# Patient Record
Sex: Female | Born: 1955
Health system: Southern US, Community
[De-identification: ages and names within clinical notes are randomized; demographics above are authoritative.]

## PROBLEM LIST (undated history)

## (undated) DIAGNOSIS — K5900 Constipation, unspecified: Secondary | ICD-10-CM

## (undated) DIAGNOSIS — N390 Urinary tract infection, site not specified: Secondary | ICD-10-CM

## (undated) DIAGNOSIS — E785 Hyperlipidemia, unspecified: Secondary | ICD-10-CM

## (undated) DIAGNOSIS — E119 Type 2 diabetes mellitus without complications: Secondary | ICD-10-CM

## (undated) DIAGNOSIS — L03115 Cellulitis of right lower limb: Secondary | ICD-10-CM

## (undated) DIAGNOSIS — I10 Essential (primary) hypertension: Secondary | ICD-10-CM

## (undated) DIAGNOSIS — M1990 Unspecified osteoarthritis, unspecified site: Secondary | ICD-10-CM

## (undated) DIAGNOSIS — R519 Headache, unspecified: Secondary | ICD-10-CM

## (undated) DIAGNOSIS — J439 Emphysema, unspecified: Secondary | ICD-10-CM

## (undated) DIAGNOSIS — Z720 Tobacco use: Secondary | ICD-10-CM

## (undated) DIAGNOSIS — R51 Headache: Secondary | ICD-10-CM

## (undated) HISTORY — DX: Essential (primary) hypertension: I10

## (undated) HISTORY — PX: TIBIA FRACTURE SURGERY: SHX806

## (undated) HISTORY — DX: Urinary tract infection, site not specified: N39.0

## (undated) HISTORY — PX: ANKLE FRACTURE SURGERY: SHX122

## (undated) HISTORY — PX: FRACTURE SURGERY: SHX138

## (undated) HISTORY — DX: Hyperlipidemia, unspecified: E78.5

## (undated) HISTORY — PX: CATARACT EXTRACTION: SUR2

---

## 2013-10-05 DIAGNOSIS — L03115 Cellulitis of right lower limb: Secondary | ICD-10-CM

## 2013-10-05 HISTORY — DX: Cellulitis of right lower limb: L03.115

## 2014-10-20 ENCOUNTER — Emergency Department (HOSPITAL_COMMUNITY): Payer: 59

## 2014-10-20 ENCOUNTER — Encounter (HOSPITAL_COMMUNITY): Payer: Self-pay | Admitting: Emergency Medicine

## 2014-10-20 ENCOUNTER — Inpatient Hospital Stay (HOSPITAL_COMMUNITY)
Admission: EM | Admit: 2014-10-20 | Discharge: 2014-10-24 | DRG: 872 | Disposition: A | Discharge status: Home / Self Care | Payer: 59 | Attending: Internal Medicine | Admitting: Internal Medicine

## 2014-10-20 DIAGNOSIS — Z8781 Personal history of (healed) traumatic fracture: Secondary | ICD-10-CM

## 2014-10-20 DIAGNOSIS — Z79899 Other long term (current) drug therapy: Secondary | ICD-10-CM | POA: Diagnosis not present

## 2014-10-20 DIAGNOSIS — Z833 Family history of diabetes mellitus: Secondary | ICD-10-CM

## 2014-10-20 DIAGNOSIS — Z720 Tobacco use: Secondary | ICD-10-CM | POA: Diagnosis present

## 2014-10-20 DIAGNOSIS — L97419 Non-pressure chronic ulcer of right heel and midfoot with unspecified severity: Secondary | ICD-10-CM | POA: Diagnosis present

## 2014-10-20 DIAGNOSIS — I70269 Atherosclerosis of native arteries of extremities with gangrene, unspecified extremity: Secondary | ICD-10-CM | POA: Diagnosis present

## 2014-10-20 DIAGNOSIS — E1165 Type 2 diabetes mellitus with hyperglycemia: Secondary | ICD-10-CM | POA: Diagnosis present

## 2014-10-20 DIAGNOSIS — R739 Hyperglycemia, unspecified: Secondary | ICD-10-CM | POA: Diagnosis not present

## 2014-10-20 DIAGNOSIS — L97519 Non-pressure chronic ulcer of other part of right foot with unspecified severity: Secondary | ICD-10-CM | POA: Diagnosis present

## 2014-10-20 DIAGNOSIS — Z01810 Encounter for preprocedural cardiovascular examination: Secondary | ICD-10-CM | POA: Diagnosis not present

## 2014-10-20 DIAGNOSIS — IMO0001 Reserved for inherently not codable concepts without codable children: Secondary | ICD-10-CM | POA: Diagnosis present

## 2014-10-20 DIAGNOSIS — Z9889 Other specified postprocedural states: Secondary | ICD-10-CM

## 2014-10-20 DIAGNOSIS — L03115 Cellulitis of right lower limb: Secondary | ICD-10-CM | POA: Diagnosis present

## 2014-10-20 DIAGNOSIS — M1990 Unspecified osteoarthritis, unspecified site: Secondary | ICD-10-CM | POA: Diagnosis present

## 2014-10-20 DIAGNOSIS — I739 Peripheral vascular disease, unspecified: Secondary | ICD-10-CM | POA: Diagnosis not present

## 2014-10-20 DIAGNOSIS — I10 Essential (primary) hypertension: Secondary | ICD-10-CM | POA: Diagnosis present

## 2014-10-20 DIAGNOSIS — F17210 Nicotine dependence, cigarettes, uncomplicated: Secondary | ICD-10-CM | POA: Diagnosis present

## 2014-10-20 DIAGNOSIS — L089 Local infection of the skin and subcutaneous tissue, unspecified: Secondary | ICD-10-CM | POA: Diagnosis present

## 2014-10-20 DIAGNOSIS — B373 Candidiasis of vulva and vagina: Secondary | ICD-10-CM | POA: Diagnosis present

## 2014-10-20 DIAGNOSIS — R030 Elevated blood-pressure reading, without diagnosis of hypertension: Secondary | ICD-10-CM | POA: Diagnosis not present

## 2014-10-20 DIAGNOSIS — A419 Sepsis, unspecified organism: Principal | ICD-10-CM | POA: Diagnosis present

## 2014-10-20 DIAGNOSIS — E11628 Type 2 diabetes mellitus with other skin complications: Secondary | ICD-10-CM | POA: Diagnosis present

## 2014-10-20 DIAGNOSIS — L298 Other pruritus: Secondary | ICD-10-CM

## 2014-10-20 DIAGNOSIS — Z791 Long term (current) use of non-steroidal anti-inflammatories (NSAID): Secondary | ICD-10-CM

## 2014-10-20 DIAGNOSIS — N898 Other specified noninflammatory disorders of vagina: Secondary | ICD-10-CM | POA: Diagnosis present

## 2014-10-20 DIAGNOSIS — I70235 Atherosclerosis of native arteries of right leg with ulceration of other part of foot: Secondary | ICD-10-CM | POA: Diagnosis not present

## 2014-10-20 HISTORY — DX: Tobacco use: Z72.0

## 2014-10-20 HISTORY — DX: Headache, unspecified: R51.9

## 2014-10-20 HISTORY — DX: Headache: R51

## 2014-10-20 HISTORY — DX: Cellulitis of right lower limb: L03.115

## 2014-10-20 HISTORY — DX: Unspecified osteoarthritis, unspecified site: M19.90

## 2014-10-20 LAB — D-DIMER, QUANTITATIVE: D-Dimer, Quant: 0.58 ug/mL-FEU — ABNORMAL HIGH (ref 0.00–0.48)

## 2014-10-20 LAB — CBC
HCT: 39 % (ref 36.0–46.0)
Hemoglobin: 13.2 g/dL (ref 12.0–15.0)
MCH: 29.3 pg (ref 26.0–34.0)
MCHC: 33.8 g/dL (ref 30.0–36.0)
MCV: 86.5 fL (ref 78.0–100.0)
Platelets: 408 10*3/uL — ABNORMAL HIGH (ref 150–400)
RBC: 4.51 MIL/uL (ref 3.87–5.11)
RDW: 12.8 % (ref 11.5–15.5)
WBC: 16.9 10*3/uL — ABNORMAL HIGH (ref 4.0–10.5)

## 2014-10-20 LAB — BASIC METABOLIC PANEL WITH GFR
Anion gap: 9 (ref 5–15)
BUN: 15 mg/dL (ref 6–20)
CO2: 24 mmol/L (ref 22–32)
Calcium: 9.3 mg/dL (ref 8.9–10.3)
Chloride: 104 mmol/L (ref 101–111)
Creatinine, Ser: 0.85 mg/dL (ref 0.44–1.00)
GFR calc Af Amer: >60 mL/min
GFR calc non Af Amer: >60 mL/min
Glucose, Bld: 391 mg/dL — ABNORMAL HIGH (ref 65–99)
Potassium: 4.5 mmol/L (ref 3.5–5.1)
Sodium: 137 mmol/L (ref 135–145)

## 2014-10-20 LAB — C-REACTIVE PROTEIN: CRP: 8.4 mg/dL — ABNORMAL HIGH

## 2014-10-20 LAB — SEDIMENTATION RATE: Sed Rate: 58 mm/h — ABNORMAL HIGH (ref 0–22)

## 2014-10-20 MED ORDER — MELATONIN 10 MG PO TBDP: 20.0000 mg | ORAL_TABLET | Freq: Every day | ORAL | Status: DC

## 2014-10-20 MED ORDER — SODIUM CHLORIDE 0.9 % IV BOLUS (SEPSIS)
1500.0000 mL | Freq: Once | INTRAVENOUS | Status: AC
  Administered 2014-10-20: 1500 mL via INTRAVENOUS

## 2014-10-20 MED ORDER — PIPERACILLIN-TAZOBACTAM 3.375 G IVPB 30 MIN
3.3750 g | Freq: Once | INTRAVENOUS | Status: AC
  Administered 2014-10-21: 3.375 g via INTRAVENOUS
  Filled 2014-10-20: qty 50

## 2014-10-20 MED ORDER — ONDANSETRON HCL 4 MG/2ML IJ SOLN
4.0000 mg | Freq: Three times a day (TID) | INTRAMUSCULAR | Status: DC | PRN
  Filled 2014-10-20: qty 2

## 2014-10-20 MED ORDER — MORPHINE SULFATE 2 MG/ML IJ SOLN
2.0000 mg | INTRAMUSCULAR | Status: DC | PRN
  Administered 2014-10-21 (×3): 2 mg via INTRAVENOUS
  Filled 2014-10-20 (×3): qty 1

## 2014-10-20 MED ORDER — VANCOMYCIN HCL IN DEXTROSE 1-5 GM/200ML-% IV SOLN: 1000.0000 mg | Freq: Two times a day (BID) | INTRAVENOUS | Status: DC

## 2014-10-20 MED ORDER — HYDROCODONE-ACETAMINOPHEN 5-325 MG PO TABS
2.0000 | ORAL_TABLET | Freq: Once | ORAL | Status: AC
  Administered 2014-10-20: 2 via ORAL
  Filled 2014-10-20: qty 2

## 2014-10-20 MED ORDER — SODIUM CHLORIDE 0.9 % IV BOLUS (SEPSIS)
1000.0000 mL | Freq: Once | INTRAVENOUS | Status: AC
  Administered 2014-10-20: 1000 mL via INTRAVENOUS

## 2014-10-20 MED ORDER — INSULIN GLARGINE 100 UNIT/ML ~~LOC~~ SOLN
3.0000 [IU] | Freq: Every day | SUBCUTANEOUS | Status: DC
  Administered 2014-10-20 – 2014-10-21 (×2): 3 [IU] via SUBCUTANEOUS
  Filled 2014-10-20 (×2): qty 0.03

## 2014-10-20 MED ORDER — VANCOMYCIN HCL IN DEXTROSE 1-5 GM/200ML-% IV SOLN
1000.0000 mg | Freq: Once | INTRAVENOUS | Status: AC
  Administered 2014-10-20: 1000 mg via INTRAVENOUS
  Filled 2014-10-20: qty 200

## 2014-10-20 MED ORDER — SODIUM CHLORIDE 0.9 % IV SOLN
INTRAVENOUS | Status: DC
  Administered 2014-10-20: 23:00:00 via INTRAVENOUS

## 2014-10-20 MED ORDER — HYDROCODONE-ACETAMINOPHEN 5-325 MG PO TABS
2.0000 | ORAL_TABLET | Freq: Four times a day (QID) | ORAL | Status: DC | PRN
  Administered 2014-10-21 – 2014-10-24 (×7): 2 via ORAL
  Filled 2014-10-20 (×8): qty 2

## 2014-10-20 MED ORDER — INSULIN ASPART 100 UNIT/ML ~~LOC~~ SOLN
0.0000 [IU] | Freq: Three times a day (TID) | SUBCUTANEOUS | Status: DC
  Administered 2014-10-21: 3 [IU] via SUBCUTANEOUS

## 2014-10-20 MED ORDER — FLUCONAZOLE 150 MG PO TABS
150.0000 mg | ORAL_TABLET | Freq: Once | ORAL | Status: AC
  Administered 2014-10-21: 150 mg via ORAL
  Filled 2014-10-20: qty 1

## 2014-10-20 MED ORDER — PIPERACILLIN-TAZOBACTAM 3.375 G IVPB
3.3750 g | Freq: Three times a day (TID) | INTRAVENOUS | Status: DC
  Administered 2014-10-21 – 2014-10-24 (×10): 3.375 g via INTRAVENOUS
  Filled 2014-10-20 (×13): qty 50

## 2014-10-20 MED ORDER — NICOTINE 21 MG/24HR TD PT24
21.0000 mg | MEDICATED_PATCH | Freq: Every day | TRANSDERMAL | Status: DC
  Administered 2014-10-21 – 2014-10-23 (×3): 21 mg via TRANSDERMAL
  Filled 2014-10-20 (×5): qty 1

## 2014-10-20 MED ORDER — VANCOMYCIN HCL IN DEXTROSE 750-5 MG/150ML-% IV SOLN
750.0000 mg | Freq: Two times a day (BID) | INTRAVENOUS | Status: DC
  Administered 2014-10-21 – 2014-10-23 (×6): 750 mg via INTRAVENOUS
  Filled 2014-10-20 (×9): qty 150

## 2014-10-20 NOTE — Progress Notes (Signed)
ANTIBIOTIC CONSULT NOTE - INITIAL  Pharmacy Consult for Vancomycin and Zosyn Indication: R foot wound infection  No Known Allergies  Patient Measurements: Height: 5\' 4"  (162.6 cm) Weight: 176 lb 2.4 oz (79.9 kg) IBW/kg (Calculated) : 54.7  Vital Signs: Temp: 97.6 F (36.4 C) (07/15 2300) Temp Source: Oral (07/15 2300) BP: 191/74 mmHg (07/15 2300) Pulse Rate: 84 (07/15 2300) Intake/Output from previous day:   Intake/Output from this shift: Total I/O In: -  Out: 250 [Urine:250]  Labs:  Recent Labs  10/20/14 2013  WBC 16.9*  HGB 13.2  PLT 408*  CREATININE 0.85   Estimated Creatinine Clearance: 73.8 mL/min (by C-G formula based on Cr of 0.85). No results for input(s): VANCOTROUGH, VANCOPEAK, VANCORANDOM, GENTTROUGH, GENTPEAK, GENTRANDOM, TOBRATROUGH, TOBRAPEAK, TOBRARND, AMIKACINPEAK, AMIKACINTROU, AMIKACIN in the last 72 hours.   Microbiology: No results found for this or any previous visit (from the past 720 hour(s)).  Medical History: Past Medical History  Diagnosis Date  . Tobacco abuse     Medications:  Prescriptions prior to admission  Medication Sig Dispense Refill Last Dose  . ibuprofen (ADVIL,MOTRIN) 200 MG tablet Take 400 mg by mouth every 6 (six) hours as needed for moderate pain.   10/20/2014 at Unknown time  . Melatonin 10 MG TBDP Take 20 mg by mouth at bedtime.   10/19/2014 at Unknown time  . sulfamethoxazole-trimethoprim (BACTRIM DS,SEPTRA DS) 800-160 MG per tablet Take 1 tablet by mouth 2 (two) times daily.   Completed Course at 10/17/14   Assessment: 59 y.o. female presents with R foot wound infection. Noted that she completed 1-week course of Bactrim on 7/12 (prescribed by urgent care) but no improvement in wounds seen. Afeb. WBC elevated to 16.9. SCr 0.95, est CrCl 75 ml/min. Vanc 1gm given in ED ~2115. To begin Zosyn and continue Vancomycin.  Goal of Therapy:  Vancomycin trough level 10-15 mcg/ml  Plan:  Zosyn 3.375gm IV now over 30 min then  q8h - subsequent doses over 4 hours Vancomycin 750mg  IV q12h Will f/u renal function, micro data, and pt's clinical condition Vanc trough prn  Christoper Fabianaron Lexxus Underhill, PharmD, BCPS Clinical pharmacist, pager [phone] 10/20/2014,11:37 PM

## 2014-10-20 NOTE — ED Notes (Signed)
Pt. reports worsening right foot wound infections for several weeks with swelling and drainage , denies injury , no fever or chills. Seen at an urgent care diagnosed with cellulitis prescribed with oral antibiotic with no improvement .

## 2014-10-20 NOTE — ED Provider Notes (Signed)
CSN: [id number]     Arrival date & time 10/20/14  2000 History   First MD Initiated Contact with Patient 10/20/14 2045     Chief Complaint  Patient presents with  . Wound Infection     (Consider location/radiation/quality/duration/timing/severity/associated sxs/prior Treatment) HPI Comments: 59 year old female with no significant past medical history presents to the emergency department for further evaluation of wounds to her right foot. Patient states that symptoms began one month ago and have been worsening over time. She reports worsening, constant pain in her right foot and right calf muscle. Pain aggravated with ambulation. She was seen at urgent care 1 week ago and diagnosed with cellulitis. She reports taking Bactrim for symptoms for one week with no improvement in the wounds to her right foot. She states that the areas are turning black; this occurred to her right great toe over the last 48 hours. Over the last 48 hours she has also had decreased sensation to near numbness in her R great toe. She has had some mild bloody drainage from her wounds. She denies any purulence. Symptoms also associated with swelling in the right foot and ankle. She reports that the swelling improves with elevation. She has worsening pain at night time described as throbbing. No reported fevers or chills. Patient denies a known history of diabetes. She moved to [place] 5 years ago and has never established care with a primary physician. No known hx of DVT/PE. No previous history of PAD. Tetanus updated 1 week ago.  The history is provided by the patient. No language interpreter was used.    Past Medical History  Diagnosis Date  . Tobacco abuse    Past Surgical History  Procedure Laterality Date  . Leg surgery     No family history on file. History  Substance Use Topics  . Smoking status: Current Every Day Smoker  . Smokeless tobacco: Not on file  . Alcohol Use: No   OB History    No data  available      Review of Systems  Constitutional: Negative for fever.  Musculoskeletal: Positive for myalgias.  Skin: Positive for color change and wound.  Neurological: Positive for numbness. Negative for weakness.  All other systems reviewed and are negative.   Allergies  Review of patient's allergies indicates no known allergies.  Home Medications   Prior to Admission medications   Medication Sig Start Date End Date Taking? Authorizing Provider  ibuprofen (ADVIL,MOTRIN) 200 MG tablet Take 400 mg by mouth every 6 (six) hours as needed for moderate pain.   Yes Historical Provider, MD  Melatonin 10 MG TBDP Take 20 mg by mouth at bedtime.   Yes Historical Provider, MD  sulfamethoxazole-trimethoprim (BACTRIM DS,SEPTRA DS) 800-160 MG per tablet Take 1 tablet by mouth 2 (two) times daily.    Historical Provider, MD   BP 171/77 mmHg  Pulse 87  Temp(Src) 98.7 F (37.1 C) (Oral)  Resp 18  Wt 172 lb 12.8 oz (78.382 kg)  SpO2 96%   Physical Exam  Constitutional: She is oriented to person, place, and time. She appears well-developed and well-nourished. No distress.  Nontoxic/nonseptic appearing  HENT:  Head: Normocephalic and atraumatic.  Eyes: Conjunctivae and EOM are normal. No scleral icterus.  Neck: Normal range of motion.  Cardiovascular: Normal rate, regular rhythm and intact distal pulses.   DP and PT pulses 2+ in the RLE. Capillary refill <2 seconds in all digits of R foot. No murmur appreciated.  Pulmonary/Chest: Effort normal and  breath sounds normal. No respiratory distress. She has no wheezes. She has no rales.  Respirations even and unlabored. Lungs CTAB.  Musculoskeletal: Normal range of motion.       Right foot: There is tenderness and swelling. There is normal range of motion and normal capillary refill.       Feet:  Patient with TTP to calf of RLE. No palpable cords. Pitting edema with erythema and mild heat to touch in the R foot noted on exam. There appears to  be mild linear erythematous streaking extending proximally from the R great toe.  Neurological: She is alert and oriented to person, place, and time. She exhibits normal muscle tone. Coordination normal.  Decreased sensation to light touch in R great toe; sensation to light touch intact in all other digits of R foot.  Skin: Skin is warm and dry. No rash noted. She is not diaphoretic. No pallor.  Psychiatric: She has a normal mood and affect. Her behavior is normal.  Nursing note and vitals reviewed.   ED Course  Procedures (including critical care time) Labs Review Labs Reviewed  CBC - Abnormal; Notable for the following:    WBC 16.9 (*)    Platelets 408 (*)    All other components within normal limits  BASIC METABOLIC PANEL - Abnormal; Notable for the following:    Glucose, Bld 391 (*)    All other components within normal limits  C-REACTIVE PROTEIN - Abnormal; Notable for the following:    CRP 8.4 (*)    All other components within normal limits  D-DIMER, QUANTITATIVE (NOT AT [facility]) - Abnormal; Notable for the following:    D-Dimer, Quant 0.58 (*)    All other components within normal limits  SEDIMENTATION RATE  HEMOGLOBIN A1C    Imaging Review Dg Foot Complete Right  10/20/2014   CLINICAL DATA:  Right foot pain and swelling for 3 weeks  EXAM: RIGHT FOOT COMPLETE - 3+ VIEW  COMPARISON:  None.  FINDINGS: Three views of the right foot submitted. No acute fracture or subluxation. There is diffuse osteopenia. There is old fracture deformity and metallic fixation screw in distal tibia. Significant degenerative changes tibiotalar joint. There is plantar spur of calcaneus. Dorsal spurring tarsal region.  IMPRESSION: No acute fracture or subluxation. Posttraumatic and postsurgical changes distal tibia. Significant degenerative changes tibiotalar joint. Mild dorsal spurring tarsal region. There is diffuse osteopenia. Plantar spurring of calcaneus.   Electronically Signed   By: Natasha Mead M.D.    On: 10/20/2014 21:47     EKG Interpretation None                 Medications  vancomycin (VANCOCIN) IVPB 1000 mg/200 mL premix (1,000 mg Intravenous New Bag/Given 10/20/14 2116)  sodium chloride 0.9 % bolus 1,000 mL (1,000 mLs Intravenous New Bag/Given 10/20/14 2118)  HYDROcodone-acetaminophen (NORCO/VICODIN) 5-325 MG per tablet 2 tablet (2 tablets Oral Given 10/20/14 2120)     MDM   Final diagnoses:  Ulcer of foot, right, with unspecified severity  Cellulitis of right foot    59 y/o female presents for evaluation of wound to RLE/foot. Concern for infected pressure ulcers. No known hx of DM, but CBG of 391 suggest diabetic history. Patient with leukocytosis and elevated CRP. No evidence of osteomyelitis on Xray. She has had no improvement of symptoms with Bactrim as outpatient. Will admit for IV abx given failed management as outpatient. Case discussed with [facility]; Dr. Clyde Lundborg to admit.   Filed Vitals:  10/20/14 2007 10/20/14 2052 10/20/14 2100 10/20/14 2115  BP: 180/86 189/82 187/93 171/77  Pulse: 102 92 94 87  Temp: 98.1 F (36.7 C) 98.7 F (37.1 C)    TempSrc: Oral Oral    Resp: 18 18    Weight: 172 lb 12.8 oz (78.382 kg)     SpO2: 96% 98% 99% 96%     Antony MaduraKelly Shaden Lacher, PA-C 10/20/14 2238  Linwood DibblesJon Knapp, MD 10/20/14 [phone]
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
## 2014-10-21 ENCOUNTER — Inpatient Hospital Stay (HOSPITAL_COMMUNITY): Payer: 59

## 2014-10-21 DIAGNOSIS — L089 Local infection of the skin and subcutaneous tissue, unspecified: Secondary | ICD-10-CM

## 2014-10-21 DIAGNOSIS — I70235 Atherosclerosis of native arteries of right leg with ulceration of other part of foot: Secondary | ICD-10-CM

## 2014-10-21 DIAGNOSIS — N898 Other specified noninflammatory disorders of vagina: Secondary | ICD-10-CM | POA: Diagnosis present

## 2014-10-21 DIAGNOSIS — R739 Hyperglycemia, unspecified: Secondary | ICD-10-CM | POA: Diagnosis present

## 2014-10-21 DIAGNOSIS — IMO0001 Reserved for inherently not codable concepts without codable children: Secondary | ICD-10-CM | POA: Diagnosis present

## 2014-10-21 DIAGNOSIS — R030 Elevated blood-pressure reading, without diagnosis of hypertension: Secondary | ICD-10-CM

## 2014-10-21 LAB — GLUCOSE, CAPILLARY
Glucose-Capillary: 213 mg/dL — ABNORMAL HIGH (ref 65–99)
Glucose-Capillary: 193 mg/dL — ABNORMAL HIGH (ref 65–99)
Glucose-Capillary: 195 mg/dL — ABNORMAL HIGH (ref 65–99)

## 2014-10-21 LAB — TYPE AND SCREEN
ABO/RH(D): A POS
Antibody Screen: NEGATIVE

## 2014-10-21 LAB — APTT: aPTT: 30 seconds (ref 24–37)

## 2014-10-21 LAB — RAPID URINE DRUG SCREEN, HOSP PERFORMED
Amphetamines: NOT DETECTED
Barbiturates: NOT DETECTED
Benzodiazepines: NOT DETECTED
Cocaine: NOT DETECTED
Opiates: POSITIVE — AB
Tetrahydrocannabinol: NOT DETECTED

## 2014-10-21 LAB — PROCALCITONIN: Procalcitonin: <0.1 ng/mL

## 2014-10-21 LAB — ABO/RH: ABO/RH(D): A POS

## 2014-10-21 LAB — PROTIME-INR
INR: 1.07 (ref 0.00–1.49)
Prothrombin Time: 14.1 seconds (ref 11.6–15.2)

## 2014-10-21 LAB — HIV ANTIBODY (ROUTINE TESTING W REFLEX): HIV Screen 4th Generation wRfx: NONREACTIVE

## 2014-10-21 LAB — LACTIC ACID, PLASMA
Lactic Acid, Venous: 0.7 mmol/L (ref 0.5–2.0)
Lactic Acid, Venous: 1 mmol/L (ref 0.5–2.0)

## 2014-10-21 MED ORDER — INSULIN ASPART 100 UNIT/ML ~~LOC~~ SOLN
0.0000 [IU] | SUBCUTANEOUS | Status: DC
Start: 2014-10-21 — End: 2014-10-24
  Administered 2014-10-21: 2 [IU] via SUBCUTANEOUS
  Administered 2014-10-21 – 2014-10-22 (×3): 3 [IU] via SUBCUTANEOUS
  Administered 2014-10-22: 8 [IU] via SUBCUTANEOUS
  Administered 2014-10-22: 5 [IU] via SUBCUTANEOUS
  Administered 2014-10-22 (×2): 3 [IU] via SUBCUTANEOUS
  Administered 2014-10-23: 1 [IU] via SUBCUTANEOUS
  Administered 2014-10-24: 2 [IU] via SUBCUTANEOUS
  Administered 2014-10-24: 3 [IU] via SUBCUTANEOUS
  Administered 2014-10-24: 5 [IU] via SUBCUTANEOUS

## 2014-10-21 MED ORDER — HYDRALAZINE HCL 20 MG/ML IJ SOLN: 5.0000 mg | INTRAMUSCULAR | Status: DC | PRN

## 2014-10-21 MED ORDER — INSULIN ASPART 100 UNIT/ML ~~LOC~~ SOLN: 0.0000 [IU] | Freq: Every day | SUBCUTANEOUS | Status: DC

## 2014-10-21 MED ORDER — GADOBENATE DIMEGLUMINE 529 MG/ML IV SOLN
15.0000 mL | Freq: Once | INTRAVENOUS | Status: AC | PRN
  Administered 2014-10-21: 15 mL via INTRAVENOUS

## 2014-10-21 MED ORDER — INSULIN ASPART 100 UNIT/ML ~~LOC~~ SOLN: 0.0000 [IU] | Freq: Three times a day (TID) | SUBCUTANEOUS | Status: DC

## 2014-10-21 MED ORDER — INSULIN GLARGINE 100 UNIT/ML ~~LOC~~ SOLN
15.0000 [IU] | Freq: Every day | SUBCUTANEOUS | Status: DC
  Administered 2014-10-21 – 2014-10-23 (×3): 15 [IU] via SUBCUTANEOUS
  Filled 2014-10-21 (×4): qty 0.15

## 2014-10-21 MED ORDER — HEPARIN SODIUM (PORCINE) 5000 UNIT/ML IJ SOLN
5000.0000 [IU] | Freq: Three times a day (TID) | INTRAMUSCULAR | Status: AC
  Administered 2014-10-21 – 2014-10-23 (×5): 5000 [IU] via SUBCUTANEOUS
  Filled 2014-10-21 (×10): qty 1
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
## 2014-10-21 NOTE — Evaluation (Addendum)
Physical Therapy Evaluation Patient Details Name: Shirley BaltimoreShirley Chen MRN: [id number] DOB: 05-Aug-1955 Today's Date: 10/21/2014   History of Present Illness  Pt is a 59 y.o. female admitted for right foot pain. She has necrotic ulcers on her R great toe and R heel.  Clinical Impression  Pt admitted with above diagnosis. Pt currently with functional limitations due to the deficits listed below (see PT Problem List). On eval, gait limited by pain. RW utilized for ambulation to decrease WB on R foot. Pt will benefit from skilled PT to increase their independence and safety with mobility.  Pt is scheduled for an angiogram on 10-23-14. Once her vascular surgery plan is established, d/c recommendation may need to be updated.     Follow Up Recommendations No follow up services; supervision-intermittent    Equipment Recommendations  RW with 5" wheels   Recommendations for Other Services       Precautions / Restrictions Precautions Precautions: None      Mobility  Bed Mobility Overal bed mobility: Independent                Transfers Overall transfer level: Needs assistance Equipment used: Ambulation equipment used Transfers: Sit to/from Stand;Stand Pivot Transfers Sit to Stand: Supervision Stand pivot transfers: Supervision          Ambulation/Gait Ambulation/Gait assistance: Min guard Ambulation Distance (Feet): 150 Feet Assistive device: Rolling walker (2 wheeled) Gait Pattern/deviations: Step-to pattern;Antalgic Gait velocity: decreased      Stairs            Wheelchair Mobility    Modified Rankin (Stroke Patients Only)       Balance                                             Pertinent Vitals/Pain Pain Assessment: 0-10 Pain Score: 8  Pain Location: R foot and calf Pain Descriptors / Indicators: Squeezing;Sharp Pain Intervention(s): Monitored during session;Patient requesting pain meds-RN notified;Repositioned    Home Living  Family/patient expects to be discharged to:: Private residence Living Arrangements: Children Available Help at Discharge: Family Type of Home: House       Home Layout: One level Home Equipment: None      Prior Function Level of Independence: Independent               Hand Dominance        Extremity/Trunk Assessment                         Communication   Communication: No difficulties  Cognition Arousal/Alertness: Awake/alert Behavior During Therapy: WFL for tasks assessed/performed Overall Cognitive Status: Within Functional Limits for tasks assessed                      General Comments      Exercises        Assessment/Plan    PT Assessment Patient needs continued PT services  PT Diagnosis Acute pain;Difficulty walking   PT Problem List Decreased activity tolerance;Decreased mobility;Pain;Decreased knowledge of use of DME  PT Treatment Interventions DME instruction;Gait training;Stair training;Functional mobility training;Therapeutic activities;Therapeutic exercise;Patient/family education;Balance training   PT Goals (Current goals can be found in the Care Plan section) Acute Rehab PT Goals Patient Stated Goal: "I don't want to lose my toe." PT Goal Formulation: With patient Time For Goal Achievement: 11/04/14 Potential to  Achieve Goals: Good    Frequency Min 3X/week   Barriers to discharge        Co-evaluation               End of Session Equipment Utilized During Treatment: Gait belt Activity Tolerance: Patient limited by pain Patient left: in bed;with call bell/phone within reach;with family/visitor present Nurse Communication: Mobility status;Patient requests pain meds         Time: 4098-1191 PT Time Calculation (min) (ACUTE ONLY): 13 min   Charges:   PT Evaluation $Initial PT Evaluation Tier I: 1 Procedure     PT G Codes:        Ilda Foil 10/21/2014, 2:51 PM
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
## 2014-10-21 NOTE — Progress Notes (Signed)
PT Cancellation Note  Patient Details Name: Shirley BaltimoreShirley Chen MRN: [id number] DOB: [birth year]   Cancelled Treatment:    Reason Eval/Treat Not Completed: Patient not medically ready. Awaiting ortho consult for possible surgery today. Pt is currently NPO.   Ilda FoilGarrow, Evangaline Jou Rene 10/21/2014, 10:37 AM
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
## 2014-10-21 NOTE — Progress Notes (Signed)
Patient Demographics:    Shirley Chen, is a 59 y.o. female, DOB - Jul 30, 1955, ZOX:[id number]  Admit date - 10/20/2014   Admitting Physician Lorretta HarpXilin Niu, MD  Outpatient Primary MD for the patient is No PCP Per Patient  LOS - 1   Chief Complaint  Patient presents with  . Wound Infection        Subjective:    Shirley BaltimoreShirley Chen today has, No headache, No chest pain, No abdominal pain - No Nausea, No new weakness tingling or numbness, No Cough - SOB.    Assessment  & Plan :     1. Right first toe ulcer with possible early osteomyelitis. Also multiple ulcers on the right heel. On exam has poor pulsation in both feet, likely has advanced PAD along with poorly controlled undiagnosed diabetes.  Continue empiric anti-biotics, ABIs ordered, MRI foot noted, Dr. Lajoyce Cornersuda orthopedics consulted. Might require right first toe amputation.   2. Elevated sugars. Possibly undiagnosed type 2 diabetes mellitus, A1c pending, placed on Lantus and sliding scale monitor CBGs.   3. History of smoking. Counseled to quit.   4. Vaginal itching due to yeast infection diagnosed in the ER. Receive Diflucan.   Pending HIV and UA     Code Status : Full  Family Communication  : None present  Disposition Plan  : To be decided  Consults  :  Orthopedics  Procedures  :   ABI  MRI R foot possible early osteomyelitis at the first toe, ulcer noted on the plantar aspect of the first toe, also a few nonspecific findings  DVT Prophylaxis  :    Heparin   Lab Results  Component Value Date   PLT 408* 10/20/2014    Inpatient Medications  Scheduled Meds: . heparin subcutaneous  5,000 Units Subcutaneous 3 times per day  . insulin aspart  0-15 Units Subcutaneous Q4H  . insulin glargine  15 Units Subcutaneous Daily  .  nicotine  21 mg Transdermal Daily  . piperacillin-tazobactam (ZOSYN)  IV  3.375 g Intravenous 3 times per day  . vancomycin  750 mg Intravenous Q12H   Continuous Infusions: . sodium chloride 75 mL/hr at 10/20/14 2315   PRN Meds:.hydrALAZINE, HYDROcodone-acetaminophen, morphine injection, ondansetron  Antibiotics  :    Anti-infectives    Start     Dose/Rate Route Frequency Ordered Stop   10/21/14 0900  vancomycin (VANCOCIN) IVPB 750 mg/150 ml premix     750 mg 150 mL/hr over 60 Minutes Intravenous Every 12 hours 10/20/14 2344     10/21/14 0800  piperacillin-tazobactam (ZOSYN) IVPB 3.375 g     3.375 g 12.5 mL/hr over 240 Minutes Intravenous 3 times per day 10/20/14 2344     10/21/14 0000  piperacillin-tazobactam (ZOSYN) IVPB 3.375 g     3.375 g 100 mL/hr over 30 Minutes Intravenous  Once 10/20/14 2344 10/21/14 0030   10/20/14 2315  vancomycin (VANCOCIN) IVPB 1000 mg/200 mL premix  Status:  Discontinued     1,000 mg 200 mL/hr over 60 Minutes Intravenous Every 12 hours 10/20/14 2314 10/20/14 2317   10/20/14 2315  fluconazole (DIFLUCAN) tablet 150 mg     150 mg Oral  Once 10/20/14 2314 10/21/14 0012   10/20/14 2115  vancomycin (VANCOCIN) IVPB 1000  mg/200 mL premix     1,000 mg 200 mL/hr over 60 Minutes Intravenous  Once 10/20/14 2102 10/20/14 2249        Objective:   Filed Vitals:   10/20/14 2100 10/20/14 2115 10/20/14 2300 10/21/14 0616  BP: 187/93 171/77 191/74 144/69  Pulse: 94 87 84 80  Temp:   97.6 F (36.4 C) 97.8 F (36.6 C)  TempSrc:   Oral Oral  Resp:   20 18  Height:   5\' 4"  (1.626 m)   Weight:   79.9 kg (176 lb 2.4 oz)   SpO2: 99% 96% 93% 95%    Wt Readings from Last 3 Encounters:  10/20/14 79.9 kg (176 lb 2.4 oz)     Intake/Output Summary (Last 24 hours) at 10/21/14 1052 Last data filed at 10/21/14 0811  Gross per 24 hour  Intake      0 ml  Output    250 ml  Net   -250 ml     Physical Exam  Awake Alert, Oriented X 3, No new F.N deficits, Normal  affect [place].AT,PERRAL Supple Neck,No JVD, No cervical lymphadenopathy appriciated.  Symmetrical Chest wall movement, Good air movement bilaterally, CTAB RRR,No Gallops,Rubs or new Murmurs, No Parasternal Heave +ve B.Sounds, Abd Soft, No tenderness, No organomegaly appriciated, No rebound - guarding or rigidity. No Cyanosis, Clubbing or edema, No new Rash or bruise  Her right first to has a large dry black ulcer on the plantar aspect, appears chronic, also several wounds over the heel which also appear chronic, poor pulsations in both feet.    Data Review:   Micro Results No results found for this or any previous visit (from the past 240 hour(s)).  Radiology Reports Mr Foot Right W Wo Contrast  10/21/2014   CLINICAL DATA:  Diabetic foot infection progressively worsening. Constant.  EXAM: MRI OF THE RIGHT FOREFOOT WITHOUT AND WITH CONTRAST  TECHNIQUE: Multiplanar, multisequence MR imaging was performed both before and after administration of intravenous contrast.  CONTRAST:  15mL MULTIHANCE GADOBENATE DIMEGLUMINE 529 MG/ML IV SOLN  COMPARISON:  None.  FINDINGS: There is a distal tibial lag screw with resulting susceptibility artifact.  There is soft tissue edema without significant enhancement involving the dorsal lateral midfoot extending towards the forefoot. There is a soft tissue ulcer along the plantar aspect of the first IP joint.  There is no focal fluid collection or hematoma.  There is a osteochondral lesion involving the lateral talar dome measuring approximately 12 mm with mild surrounding marrow edema and subchondral cystic changes. There is high-grade partial-thickness versus full-thickness cartilage loss of the lateral tibiotalar joint.  There is minimal marrow edema within the first distal phalanx with equivocal enhancement, but no definite bone destruction. There is no joint effusion. There is no other focal marrow signal abnormality.  The visualized flexor, extensor and peroneal tendons  are intact.  IMPRESSION: 1. Soft tissue edema without significant enhancement involving the dorsal lateral midfoot extending towards the forefoot which may be reactive versus secondary to mild cellulitis. No drainable fluid collection. 2. Soft tissue ulceration along the plantar aspect of the first IP joint with minimal marrow edema within the first distal phalanx without significant enhancement or definite cortical destruction. These may reflect reactive marrow changes, but early mild osteomyelitis cannot the definitively excluded. 3. Osteoarthritis of the tibiotalar joint with high-grade partial versus full-thickness cartilage loss and an osteochondral lesion involving the lateral aspect of the tibiotalar joint.   Electronically Signed   By: Elige Ko  On: 10/21/2014 07:59   Dg Foot Complete Right  10/20/2014   CLINICAL DATA:  Right foot pain and swelling for 3 weeks  EXAM: RIGHT FOOT COMPLETE - 3+ VIEW  COMPARISON:  None.  FINDINGS: Three views of the right foot submitted. No acute fracture or subluxation. There is diffuse osteopenia. There is old fracture deformity and metallic fixation screw in distal tibia. Significant degenerative changes tibiotalar joint. There is plantar spur of calcaneus. Dorsal spurring tarsal region.  IMPRESSION: No acute fracture or subluxation. Posttraumatic and postsurgical changes distal tibia. Significant degenerative changes tibiotalar joint. Mild dorsal spurring tarsal region. There is diffuse osteopenia. Plantar spurring of calcaneus.   Electronically Signed   By: Natasha Mead M.D.   On: 10/20/2014 21:47     CBC  Recent Labs Lab 10/20/14 2013  WBC 16.9*  HGB 13.2  HCT 39.0  PLT 408*  MCV 86.5  MCH 29.3  MCHC 33.8  RDW 12.8    Chemistries   Recent Labs Lab 10/20/14 2013  NA 137  K 4.5  CL 104  CO2 24  GLUCOSE 391*  BUN 15  CREATININE 0.85  CALCIUM 9.3    ------------------------------------------------------------------------------------------------------------------ estimated creatinine clearance is 73.8 mL/min (by C-G formula based on Cr of 0.85). ------------------------------------------------------------------------------------------------------------------ No results for input(s): HGBA1C in the last 72 hours. ------------------------------------------------------------------------------------------------------------------ No results for input(s): CHOL, HDL, LDLCALC, TRIG, CHOLHDL, LDLDIRECT in the last 72 hours. ------------------------------------------------------------------------------------------------------------------ No results for input(s): TSH, T4TOTAL, T3FREE, THYROIDAB in the last 72 hours.  Invalid input(s): FREET3 ------------------------------------------------------------------------------------------------------------------ No results for input(s): VITAMINB12, FOLATE, FERRITIN, TIBC, IRON, RETICCTPCT in the last 72 hours.  Coagulation profile  Recent Labs Lab 10/20/14 2343  INR 1.07     Recent Labs  10/20/14 2120  DDIMER 0.58*    Cardiac Enzymes No results for input(s): CKMB, TROPONINI, MYOGLOBIN in the last 168 hours.  Invalid input(s): CK ------------------------------------------------------------------------------------------------------------------ Invalid input(s): POCBNP   Time Spent in minutes   35   Susa Raring K M.D on 10/21/2014 at 10:52 AM  Between 7am to 7pm - Pager - [phone]  After 7pm go to [email or web address] - password [facility]  [facility]   Office  [phone]
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
## 2014-10-22 ENCOUNTER — Inpatient Hospital Stay (HOSPITAL_COMMUNITY): Payer: 59

## 2014-10-22 DIAGNOSIS — Z01810 Encounter for preprocedural cardiovascular examination: Secondary | ICD-10-CM

## 2014-10-22 DIAGNOSIS — I739 Peripheral vascular disease, unspecified: Secondary | ICD-10-CM

## 2014-10-22 LAB — GLUCOSE, CAPILLARY
Glucose-Capillary: 100 mg/dL — ABNORMAL HIGH (ref 65–99)
Glucose-Capillary: 158 mg/dL — ABNORMAL HIGH (ref 65–99)
Glucose-Capillary: 161 mg/dL — ABNORMAL HIGH (ref 65–99)
Glucose-Capillary: 188 mg/dL — ABNORMAL HIGH (ref 65–99)
Glucose-Capillary: 241 mg/dL — ABNORMAL HIGH (ref 65–99)
Glucose-Capillary: 243 mg/dL — ABNORMAL HIGH (ref 65–99)
Glucose-Capillary: 273 mg/dL — ABNORMAL HIGH (ref 65–99)
Glucose-Capillary: 306 mg/dL — ABNORMAL HIGH (ref 65–99)

## 2014-10-22 NOTE — Progress Notes (Signed)
VASCULAR LAB PRELIMINARY  PRELIMINARY  PRELIMINARY  PRELIMINARY  Bilateral lower extremity venous Dopplers completed.    Preliminary report:  There is no DVT or SVT noted in the bilateral lower extremities.   Deforrest Bogle, RVT 10/22/2014, 9:57 AM

## 2014-10-22 NOTE — Progress Notes (Signed)
   VASCULAR SURGERY ASSESSMENT & PLAN:  *  INFRAINGUINAL ARTERIAL OCCLUSIVE DISEASE WITH NONHEALING ULCERS RIGHT FOOT: The patient has evidence of infrainguinal arterial occlusive disease. I have recommended arteriography. I will try to get this on the schedule for tomorrow afternoon. The schedule is very full and there is a chance that it will have to be done Tuesday. I will make further recommendations pending these results.    SUBJECTIVE: right leg feels better.  PHYSICAL EXAM: Filed Vitals:   10/21/14 0616 10/21/14 1317 10/21/14 2237 10/22/14 0621  BP: 144/69 172/75 164/72 142/65  Pulse: 80 83 100 91  Temp: 97.8 F (36.6 C) 98.1 F (36.7 C) 98.6 F (37 C) 98.3 F (36.8 C)  TempSrc: Oral Oral Oral Oral  Resp: 18 18 16 16   Height:      Weight:      SpO2: 95% 98% 97% 99%   No change in ulcers of right foot. No significant erythema. No drainage.  LABS: Lab Results  Component Value Date   WBC 16.9* 10/20/2014   HGB 13.2 10/20/2014   HCT 39.0 10/20/2014   MCV 86.5 10/20/2014   PLT 408* 10/20/2014   Lab Results  Component Value Date   CREATININE 0.85 10/20/2014   Lab Results  Component Value Date   INR 1.07 10/20/2014   CBG (last 3)   Recent Labs  10/22/14 0004 10/22/14 0401 10/22/14 0735  GLUCAP 306* 100* 161*    Principal Problem:   Right foot infection Active Problems:   Sepsis   Tobacco abuse   Hyperglycemia   Vaginal itching   Elevated blood pressure  Cari CarawayChris Byran Bilotti Beeper: [phone] 10/22/2014

## 2014-10-22 NOTE — Progress Notes (Signed)
OT Cancellation Note  Patient Details Name: Irven BaltimoreShirley Hord MRN: [id number] DOB: Jul 04, 1955   Cancelled Treatment:    Reason Eval/Treat Not Completed: Other (comment) Pt is scheduled for an angiogram on 10-23-14. Will wait to see the vascular surgery plan prior to evaluation.  Earlie RavelingStraub, Crawford Tamura L OTR/L [phone]  10/22/2014, 4:31 PM

## 2014-10-22 NOTE — Progress Notes (Signed)
Patient Demographics:    Shirley Chen, is a 59 y.o. female, DOB - 1956-01-08, UYQ:[id number]  Admit date - 10/20/2014   Admitting Physician Lorretta Harp, MD  Outpatient Primary MD for the patient is No PCP Per Patient  LOS - 2   Chief Complaint  Patient presents with  . Wound Infection        Subjective:    Shirley Chen today has, No headache, No chest pain, No abdominal pain - No Nausea, No new weakness tingling or numbness, No Cough - SOB.    Assessment  & Plan :     1. Right first toe ulcer with possible early osteomyelitis with some cellulitis. Also multiple ulcers on the right heel. On exam has poor pulsation in both feet, likely has advanced PAD along with poorly controlled undiagnosed diabetes.  Continue empiric anti-biotics, ABIs suggestive of PAD in the left leg, right leg poor waveform therefore ABI could not be calculated, MRI foot noted, Dr. Lajoyce Corners orthopedics consulted and recommended vascular input, vascular surgery consulted and on board. Vascular surgery planning for an angiogram on 10/23/2014. Further management per vascular surgery.   2. Elevated sugars. Possibly undiagnosed type 2 diabetes mellitus, A1c pending, placed on Lantus and sliding scale monitor CBGs.  No results found for: HGBA1C  CBG (last 3)   Recent Labs  10/22/14 0004 10/22/14 0401 10/22/14 0735  GLUCAP 306* 100* 161*     3. History of smoking. Counseled to quit.   4. Vaginal itching due to yeast infection diagnosed in the ER. Received Diflucan.   Pending UA  HIV negative.   Code Status : Full  Family Communication  : None present  Disposition Plan  : To be decided  Consults  :  Orthopedics  Procedures  :   ABI left 0.82. Right absent.  Lower extremity venous duplex unremarkable.  MRI  R foot possible early osteomyelitis at the first toe, ulcer noted on the plantar aspect of the first toe, also a few nonspecific findings  DVT Prophylaxis  :    Heparin   Lab Results  Component Value Date   PLT 408* 10/20/2014    Inpatient Medications  Scheduled Meds: . heparin subcutaneous  5,000 Units Subcutaneous 3 times per day  . insulin aspart  0-15 Units Subcutaneous Q4H  . insulin glargine  15 Units Subcutaneous Daily  . nicotine  21 mg Transdermal Daily  . piperacillin-tazobactam (ZOSYN)  IV  3.375 g Intravenous 3 times per day  . vancomycin  750 mg Intravenous Q12H   Continuous Infusions: . sodium chloride 75 mL/hr at 10/20/14 2315   PRN Meds:.hydrALAZINE, HYDROcodone-acetaminophen, morphine injection, ondansetron  Antibiotics  :    Anti-infectives    Start     Dose/Rate Route Frequency Ordered Stop   10/21/14 0900  vancomycin (VANCOCIN) IVPB 750 mg/150 ml premix     750 mg 150 mL/hr over 60 Minutes Intravenous Every 12 hours 10/20/14 2344     10/21/14 0800  piperacillin-tazobactam (ZOSYN) IVPB 3.375 g     3.375 g 12.5 mL/hr over 240 Minutes Intravenous 3 times per day 10/20/14 2344     10/21/14 0000  piperacillin-tazobactam (ZOSYN) IVPB 3.375 g     3.375 g 100 mL/hr over 30 Minutes Intravenous  Once 10/20/14 2344 10/21/14 0030   10/20/14 2315  vancomycin (VANCOCIN) IVPB 1000 mg/200 mL premix  Status:  Discontinued     1,000 mg 200 mL/hr over 60 Minutes Intravenous Every 12 hours 10/20/14 2314 10/20/14 2317   10/20/14 2315  fluconazole (DIFLUCAN) tablet 150 mg     150 mg Oral  Once 10/20/14 2314 10/21/14 0012   10/20/14 2115  vancomycin (VANCOCIN) IVPB 1000 mg/200 mL premix     1,000 mg 200 mL/hr over 60 Minutes Intravenous  Once 10/20/14 2102 10/20/14 2249        Objective:   Filed Vitals:   10/21/14 0616 10/21/14 1317 10/21/14 2237 10/22/14 0621  BP: 144/69 172/75 164/72 142/65  Pulse: 80 83 100 91  Temp: 97.8 F (36.6 C) 98.1 F (36.7 C) 98.6 F  (37 C) 98.3 F (36.8 C)  TempSrc: Oral Oral Oral Oral  Resp: 18 18 16 16   Height:      Weight:      SpO2: 95% 98% 97% 99%    Wt Readings from Last 3 Encounters:  10/20/14 79.9 kg (176 lb 2.4 oz)     Intake/Output Summary (Last 24 hours) at 10/22/14 1143 Last data filed at 10/22/14 1046  Gross per 24 hour  Intake    640 ml  Output   1300 ml  Net   -660 ml     Physical Exam  Awake Alert, Oriented X 3, No new F.N deficits, Normal affect [place].AT,PERRAL Supple Neck,No JVD, No cervical lymphadenopathy appriciated.  Symmetrical Chest wall movement, Good air movement bilaterally, CTAB RRR,No Gallops,Rubs or new Murmurs, No Parasternal Heave +ve B.Sounds, Abd Soft, No tenderness, No organomegaly appriciated, No rebound - guarding or rigidity. No Cyanosis, Clubbing or edema, No new Rash or bruise  Her right first to has a large dry black ulcer on the plantar aspect, appears chronic, also several wounds over the heel which also appear chronic, poor pulsations in both feet.    Data Review:   Micro Results No results found for this or any previous visit (from the past 240 hour(s)).  Radiology Reports Dg Ankle Complete Right  10/21/2014   CLINICAL DATA:  59 year old female with ulcers on the right great toe and heel. Generalized ankle pain. Diabetes and cellulitis. Initial encounter.  EXAM: RIGHT ANKLE - COMPLETE 3+ VIEW  COMPARISON:  Right foot series 10/20/2014. Right foot MRI 0038 hours today reported separately.  FINDINGS: Chronic posttraumatic and degenerative changes at the right ankle with mortise joint space loss and bulky osteophytosis. Distal right tibia cannulated screw appears stable and intact. The calcaneus appears intact with bulky degenerative spurring. No osteolysis identified. No acute fracture identified.  IMPRESSION: Advanced posttraumatic and degenerative changes about the right ankle. No radiographic evidence of osteomyelitis or acute osseous abnormality identified.    Electronically Signed   By: Shirley FlemingH  Chen M.D.   On: 10/21/2014 19:16   Mr Foot Right W Wo Contrast  10/21/2014   CLINICAL DATA:  Diabetic foot infection progressively worsening. Constant.  EXAM: MRI OF THE RIGHT FOREFOOT WITHOUT AND WITH CONTRAST  TECHNIQUE: Multiplanar, multisequence MR imaging was performed both before and after administration of intravenous contrast.  CONTRAST:  15mL MULTIHANCE GADOBENATE DIMEGLUMINE 529 MG/ML IV SOLN  COMPARISON:  None.  FINDINGS: There is a distal tibial lag screw with resulting susceptibility artifact.  There is soft tissue edema without significant enhancement involving the dorsal lateral midfoot extending towards the forefoot. There is a soft tissue ulcer along the plantar aspect  of the first IP joint.  There is no focal fluid collection or hematoma.  There is a osteochondral lesion involving the lateral talar dome measuring approximately 12 mm with mild surrounding marrow edema and subchondral cystic changes. There is high-grade partial-thickness versus full-thickness cartilage loss of the lateral tibiotalar joint.  There is minimal marrow edema within the first distal phalanx with equivocal enhancement, but no definite bone destruction. There is no joint effusion. There is no other focal marrow signal abnormality.  The visualized flexor, extensor and peroneal tendons are intact.  IMPRESSION: 1. Soft tissue edema without significant enhancement involving the dorsal lateral midfoot extending towards the forefoot which may be reactive versus secondary to mild cellulitis. No drainable fluid collection. 2. Soft tissue ulceration along the plantar aspect of the first IP joint with minimal marrow edema within the first distal phalanx without significant enhancement or definite cortical destruction. These may reflect reactive marrow changes, but early mild osteomyelitis cannot the definitively excluded. 3. Osteoarthritis of the tibiotalar joint with high-grade partial versus  full-thickness cartilage loss and an osteochondral lesion involving the lateral aspect of the tibiotalar joint.   Electronically Signed   By: Elige Ko   On: 10/21/2014 07:59   Dg Foot Complete Right  10/20/2014   CLINICAL DATA:  Right foot pain and swelling for 3 weeks  EXAM: RIGHT FOOT COMPLETE - 3+ VIEW  COMPARISON:  None.  FINDINGS: Three views of the right foot submitted. No acute fracture or subluxation. There is diffuse osteopenia. There is old fracture deformity and metallic fixation screw in distal tibia. Significant degenerative changes tibiotalar joint. There is plantar spur of calcaneus. Dorsal spurring tarsal region.  IMPRESSION: No acute fracture or subluxation. Posttraumatic and postsurgical changes distal tibia. Significant degenerative changes tibiotalar joint. Mild dorsal spurring tarsal region. There is diffuse osteopenia. Plantar spurring of calcaneus.   Electronically Signed   By: Natasha Mead M.D.   On: 10/20/2014 21:47     CBC  Recent Labs Lab 10/20/14 2013  WBC 16.9*  HGB 13.2  HCT 39.0  PLT 408*  MCV 86.5  MCH 29.3  MCHC 33.8  RDW 12.8    Chemistries   Recent Labs Lab 10/20/14 2013  NA 137  K 4.5  CL 104  CO2 24  GLUCOSE 391*  BUN 15  CREATININE 0.85  CALCIUM 9.3   ------------------------------------------------------------------------------------------------------------------ estimated creatinine clearance is 73.8 mL/min (by C-G formula based on Cr of 0.85). ------------------------------------------------------------------------------------------------------------------ No results for input(s): HGBA1C in the last 72 hours. ------------------------------------------------------------------------------------------------------------------ No results for input(s): CHOL, HDL, LDLCALC, TRIG, CHOLHDL, LDLDIRECT in the last 72 hours. ------------------------------------------------------------------------------------------------------------------ No  results for input(s): TSH, T4TOTAL, T3FREE, THYROIDAB in the last 72 hours.  Invalid input(s): FREET3 ------------------------------------------------------------------------------------------------------------------ No results for input(s): VITAMINB12, FOLATE, FERRITIN, TIBC, IRON, RETICCTPCT in the last 72 hours.  Coagulation profile  Recent Labs Lab 10/20/14 2343  INR 1.07     Recent Labs  10/20/14 2120  DDIMER 0.58*    Cardiac Enzymes No results for input(s): CKMB, TROPONINI, MYOGLOBIN in the last 168 hours.  Invalid input(s): CK ------------------------------------------------------------------------------------------------------------------ Invalid input(s): POCBNP   Time Spent in minutes   35   Susa Raring K M.D on 10/22/2014 at 11:43 AM  Between 7am to 7pm - Pager - [phone]  After 7pm go to [email or web address] - password [facility]  [facility]   Office  [phone]

## 2014-10-22 NOTE — Progress Notes (Signed)
VASCULAR LAB PRELIMINARY  ARTERIAL  ABI completed:Right ABI not ascertained as pulses absent.  Left ABI indicates mild reduction in arterial flow.    RIGHT    LEFT    PRESSURE WAVEFORM  PRESSURE WAVEFORM  BRACHIAL 166 T BRACHIAL 157 T  DP absent  DP 136 M  AT absent  AT    PT absent  PT 129 B  PER   PER    GREAT TOE  NA GREAT TOE  NA    RIGHT LEFT  ABI absent 0.82     Elzora Cullins, RVT 10/22/2014, 10:06 AM

## 2014-10-22 NOTE — Progress Notes (Signed)
Patient ID: Shirley BaltimoreShirley Chen, female   DOB: 20-Jun-1955, 59 y.o.   MRN: [id number] X-rays reviewed right ankle.  Traumatic arthritis right ankle, status post ORIF pilon fracture with hardware removed except 1 screw, no sign of chronic osteomyelitis.  Treatment pending vascular work up.

## 2014-10-23 ENCOUNTER — Encounter (HOSPITAL_COMMUNITY): Payer: Self-pay | Admitting: Vascular Surgery

## 2014-10-23 ENCOUNTER — Encounter (HOSPITAL_COMMUNITY)
Admission: EM | Disposition: A | Discharge status: Home / Self Care | Payer: 59 | Source: Home / Self Care | Attending: Internal Medicine

## 2014-10-23 HISTORY — PX: PERIPHERAL VASCULAR CATHETERIZATION: SHX172C

## 2014-10-23 LAB — HEMOGLOBIN A1C
Hgb A1c MFr Bld: 13.2 % — ABNORMAL HIGH (ref 4.8–5.6)
Hgb A1c MFr Bld: 12.9 % — ABNORMAL HIGH (ref 4.8–5.6)
Mean Plasma Glucose: 332 mg/dL
Mean Plasma Glucose: 324 mg/dL

## 2014-10-23 LAB — BASIC METABOLIC PANEL
Anion gap: 7 (ref 5–15)
BUN: 8 mg/dL (ref 6–20)
CO2: 27 mmol/L (ref 22–32)
Calcium: 8.6 mg/dL — ABNORMAL LOW (ref 8.9–10.3)
Chloride: 105 mmol/L (ref 101–111)
Creatinine, Ser: 0.58 mg/dL (ref 0.44–1.00)
GFR calc Af Amer: >60 mL/min (ref >60)
GFR calc non Af Amer: >60 mL/min (ref >60)
Glucose, Bld: 113 mg/dL — ABNORMAL HIGH (ref 65–99)
Potassium: 3.7 mmol/L (ref 3.5–5.1)
Sodium: 139 mmol/L (ref 135–145)

## 2014-10-23 LAB — GLUCOSE, CAPILLARY
Glucose-Capillary: 94 mg/dL (ref 65–99)
Glucose-Capillary: 123 mg/dL — ABNORMAL HIGH (ref 65–99)
Glucose-Capillary: 100 mg/dL — ABNORMAL HIGH (ref 65–99)
Glucose-Capillary: 84 mg/dL (ref 65–99)

## 2014-10-23 LAB — URINALYSIS, ROUTINE W REFLEX MICROSCOPIC
Bilirubin Urine: NEGATIVE
Glucose, UA: NEGATIVE mg/dL
Ketones, ur: NEGATIVE mg/dL
Leukocytes, UA: NEGATIVE
Nitrite: NEGATIVE
Protein, ur: NEGATIVE mg/dL
Specific Gravity, Urine: >1.046 — ABNORMAL HIGH (ref 1.005–1.030)
Urobilinogen, UA: 1 mg/dL (ref 0.0–1.0)
pH: 6.5 (ref 5.0–8.0)

## 2014-10-23 LAB — CBC
HCT: 36.1 % (ref 36.0–46.0)
Hemoglobin: 12.2 g/dL (ref 12.0–15.0)
MCH: 29.2 pg (ref 26.0–34.0)
MCHC: 33.8 g/dL (ref 30.0–36.0)
MCV: 86.4 fL (ref 78.0–100.0)
Platelets: 359 10*3/uL (ref 150–400)
RBC: 4.18 MIL/uL (ref 3.87–5.11)
RDW: 12.8 % (ref 11.5–15.5)
WBC: 11.2 10*3/uL — ABNORMAL HIGH (ref 4.0–10.5)

## 2014-10-23 LAB — URINE MICROSCOPIC-ADD ON

## 2014-10-23 SURGERY — ABDOMINAL AORTOGRAM

## 2014-10-23 MED ORDER — ONDANSETRON HCL 4 MG/2ML IJ SOLN: 4.0000 mg | Freq: Four times a day (QID) | INTRAMUSCULAR | Status: DC | PRN

## 2014-10-23 MED ORDER — HYDRALAZINE HCL 20 MG/ML IJ SOLN
INTRAMUSCULAR | Status: DC | PRN
  Administered 2014-10-23 (×2): 10 mg via INTRAVENOUS

## 2014-10-23 MED ORDER — MIDAZOLAM HCL 2 MG/2ML IJ SOLN
INTRAMUSCULAR | Status: DC | PRN
  Administered 2014-10-23: 1 mg via INTRAVENOUS

## 2014-10-23 MED ORDER — HYDRALAZINE HCL 20 MG/ML IJ SOLN
INTRAMUSCULAR | Status: AC
  Filled 2014-10-23: qty 1

## 2014-10-23 MED ORDER — LIVING WELL WITH DIABETES BOOK
Freq: Once | Status: DC
  Filled 2014-10-23 (×2): qty 1

## 2014-10-23 MED ORDER — HYDRALAZINE HCL 25 MG PO TABS
25.0000 mg | ORAL_TABLET | Freq: Three times a day (TID) | ORAL | Status: DC
  Administered 2014-10-23 – 2014-10-24 (×2): 25 mg via ORAL
  Filled 2014-10-23 (×7): qty 1

## 2014-10-23 MED ORDER — INSULIN STARTER KIT- SYRINGES (ENGLISH)
1.0000 | Freq: Once | Status: DC
  Filled 2014-10-23 (×2): qty 1

## 2014-10-23 MED ORDER — MIDAZOLAM HCL 2 MG/2ML IJ SOLN
INTRAMUSCULAR | Status: AC
  Filled 2014-10-23: qty 2

## 2014-10-23 MED ORDER — FENTANYL CITRATE (PF) 100 MCG/2ML IJ SOLN
INTRAMUSCULAR | Status: DC | PRN
  Administered 2014-10-23: 50 ug via INTRAVENOUS

## 2014-10-23 MED ORDER — HEPARIN (PORCINE) IN NACL 2-0.9 UNIT/ML-% IJ SOLN
INTRAMUSCULAR | Status: DC | PRN
  Administered 2014-10-23: 14:00:00

## 2014-10-23 MED ORDER — HEPARIN (PORCINE) IN NACL 2-0.9 UNIT/ML-% IJ SOLN
INTRAMUSCULAR | Status: AC
  Filled 2014-10-23: qty 1000

## 2014-10-23 MED ORDER — ENOXAPARIN SODIUM 40 MG/0.4ML ~~LOC~~ SOLN
40.0000 mg | SUBCUTANEOUS | Status: DC
  Administered 2014-10-24: 40 mg via SUBCUTANEOUS
  Filled 2014-10-23 (×2): qty 0.4

## 2014-10-23 MED ORDER — ACETAMINOPHEN 325 MG PO TABS: 650.0000 mg | ORAL_TABLET | ORAL | Status: DC | PRN

## 2014-10-23 MED ORDER — LIDOCAINE HCL (PF) 1 % IJ SOLN
INTRAMUSCULAR | Status: AC
  Filled 2014-10-23: qty 30

## 2014-10-23 MED ORDER — FENTANYL CITRATE (PF) 100 MCG/2ML IJ SOLN
INTRAMUSCULAR | Status: AC
  Filled 2014-10-23: qty 2

## 2014-10-23 MED ORDER — CARVEDILOL 6.25 MG PO TABS
6.2500 mg | ORAL_TABLET | Freq: Two times a day (BID) | ORAL | Status: DC
  Administered 2014-10-23 – 2014-10-24 (×2): 6.25 mg via ORAL
  Filled 2014-10-23 (×4): qty 1

## 2014-10-23 MED ORDER — IODIXANOL 320 MG/ML IV SOLN
INTRAVENOUS | Status: DC | PRN
  Administered 2014-10-23: 195 mL via INTRAVENOUS

## 2014-10-23 SURGICAL SUPPLY — 12 items
CATH ANGIO 5F PIGTAIL 65CM (CATHETERS) ×2 IMPLANT
CATH CROSS OVER TEMPO 5F (CATHETERS) ×2 IMPLANT
CATH STRAIGHT 5FR 65CM (CATHETERS) ×2 IMPLANT
COVER PRB 48X5XTLSCP FOLD TPE (BAG) ×1 IMPLANT
COVER PROBE 5X48 (BAG) ×1
KIT MICROINTRODUCER STIFF 5F (SHEATH) ×2 IMPLANT
KIT PV (KITS) ×2 IMPLANT
SHEATH PINNACLE 5F 10CM (SHEATH) ×2 IMPLANT
SYR MEDRAD MARK V 150ML (SYRINGE) ×2 IMPLANT
TRANSDUCER W/STOPCOCK (MISCELLANEOUS) ×2 IMPLANT
TRAY PV CATH (CUSTOM PROCEDURE TRAY) ×2 IMPLANT
WIRE BENTSON .035X145CM (WIRE) ×2 IMPLANT

## 2014-10-23 NOTE — Progress Notes (Signed)
NT Amber physically transferred all patient personal belongings to [address] including patient cell phone.
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
## 2014-10-23 NOTE — Progress Notes (Signed)
OT cancellation    10/23/14 1436  OT Visit Information  Last OT Received On 10/23/14  Reason Eval/Treat Not Completed Patient at procedure or test/ unavailable    Jenell MillinerLindsey Kutter Schnepf, OTR/L [phone]

## 2014-10-23 NOTE — Progress Notes (Addendum)
Report called to RN Margreta Journey on [address], conveyed to her that insulin starter kit is ready and needs to be picked up from pharmacy and afternoon medications including zosyn dose still needs to be administered (patient was in [place] when they were due).

## 2014-10-23 NOTE — Progress Notes (Signed)
MD Thedore MinsSingh paged, EKG shows NSR.

## 2014-10-23 NOTE — Progress Notes (Signed)
Consult Note:  Patient off unit for procedure. Will see patient this afternoon or first thing in the am for reinforcement of DM education and further explanation of DM. Due to lack of insurance may have to convert insulins (70/30 equivalent basal dose is 11 units BID of switched). Glucose trend within inpatient goal. RNs to provide ongoing basic DM education at bedside with this patient and engage patient to actively check blood glucose and administer insulin injections. Have ordered educational booklet, insulin starter kit, and DM videos.  Thanks,  Tama Headings RN, MSN, [facility] Inpatient Diabetes Coordinator Team Pager [phone]

## 2014-10-23 NOTE — Progress Notes (Signed)
PT Cancellation Note  Patient Details Name: Shirley BaltimoreShirley Satre MRN: [id number] DOB: 1955-08-09   Cancelled Treatment:    Reason Eval/Treat Not Completed: Patient at procedure or test/unavailable   Aalaysia Liggins 10/23/2014, 1:45 PM

## 2014-10-23 NOTE — Interval H&P Note (Signed)
[facility]ascular [facility] Vein Specialists of [place]  History and Physical Update  The patient was interviewed and re-examined.  The patient's previous History and Physical has been reviewed and is unchanged from Dr. Adele Danickson's consult.  There is no change in the plan of care: aortogram, bilateral leg runoff, and possible intervention.  I discussed with the patient the nature of angiographic procedures, especially the limited patencies of any endovascular intervention.  The patient is aware of that the risks of an angiographic procedure include but are not limited to: bleeding, infection, access site complications, renal failure, embolization, rupture of vessel, dissection, possible need for emergent surgical intervention, possible need for surgical procedures to treat the patient's pathology, anaphylactic reaction to contrast, and stroke and death.  The patient is aware of the risks and agrees to proceed.  Leonides SakeBrian Kristan Votta, MD [facility] Office: [phone] Pager: [phone]  10/23/2014, 11:51 AM

## 2014-10-23 NOTE — Progress Notes (Signed)
Patient Demographics:    Shirley Chen, is a 59 y.o. female, DOB - 1956-03-12, ZOX:[id number]  Admit date - 10/20/2014   Admitting Physician Lorretta Harp, MD  Outpatient Primary MD for the patient is No PCP Per Patient  LOS - 3   Chief Complaint  Patient presents with  . Wound Infection        Subjective:    Shirley Chen today has, No headache, No chest pain, No abdominal pain - No Nausea, No new weakness tingling or numbness, No Cough - SOB. Feels fine today.   Assessment  & Plan :     1. Right first toe ulcer with possible early osteomyelitis with some cellulitis. Also multiple ulcers on the right heel. On exam has poor pulsation in both feet, likely has advanced PAD along with poorly controlled undiagnosed diabetes.  Continue empiric anti-biotics, ABIs suggestive of PAD in the left leg, right leg poor waveform therefore ABI could not be calculated, MRI foot noted, Dr. Lajoyce Corners orthopedics consulted and recommended vascular input, vascular surgery consulted and on board. Vascular surgery planning for an angiogram 10/23/2014. Further management per vascular surgery.   2. Undiagnosed type 2 diabetes mellitus, A1c 12.5, placed on Lantus and sliding scale monitor CBGs. We will have diabetic educator educate the patient on diabetes and insulin.  Lab Results  Component Value Date   HGBA1C 12.9* 10/21/2014    CBG (last 3)   Recent Labs  10/22/14 2340 10/23/14 0356 10/23/14 0752  GLUCAP 158* 94 123*     3. History of smoking. Counseled to quit.   4. Vaginal itching due to yeast infection diagnosed in the ER. Received Diflucan.   5. Essential hypertension. Placed on Coreg and hydralazine, if after all her vascular procedures creatinine stays stable we'll place her on ACE inhibitor.   HIV  negative.   Code Status : Full  Family Communication  : None present  Disposition Plan  : To be decided  Consults  :  Orthopedics  Procedures  :   ABI left 0.82. Right absent.  Lower extremity venous duplex unremarkable.  MRI R foot possible early osteomyelitis at the first toe, ulcer noted on the plantar aspect of the first toe, also a few nonspecific findings  DVT Prophylaxis  :    Heparin   Lab Results  Component Value Date   PLT 359 10/23/2014    Inpatient Medications  Scheduled Meds: . heparin subcutaneous  5,000 Units Subcutaneous 3 times per day  . insulin aspart  0-15 Units Subcutaneous Q4H  . insulin glargine  15 Units Subcutaneous Daily  . nicotine  21 mg Transdermal Daily  . piperacillin-tazobactam (ZOSYN)  IV  3.375 g Intravenous 3 times per day  . vancomycin  750 mg Intravenous Q12H   Continuous Infusions: . sodium chloride 75 mL/hr at 10/20/14 2315   PRN Meds:.hydrALAZINE, HYDROcodone-acetaminophen, morphine injection, ondansetron  Antibiotics  :    Anti-infectives    Start     Dose/Rate Route Frequency Ordered Stop   10/21/14 0900  vancomycin (VANCOCIN) IVPB 750 mg/150 ml premix     750 mg 150 mL/hr over 60 Minutes Intravenous Every 12 hours 10/20/14 2344     10/21/14 0800  piperacillin-tazobactam (ZOSYN) IVPB 3.375 g  3.375 g 12.5 mL/hr over 240 Minutes Intravenous 3 times per day 10/20/14 2344     10/21/14 0000  piperacillin-tazobactam (ZOSYN) IVPB 3.375 g     3.375 g 100 mL/hr over 30 Minutes Intravenous  Once 10/20/14 2344 10/21/14 0030   10/20/14 2315  vancomycin (VANCOCIN) IVPB 1000 mg/200 mL premix  Status:  Discontinued     1,000 mg 200 mL/hr over 60 Minutes Intravenous Every 12 hours 10/20/14 2314 10/20/14 2317   10/20/14 2315  fluconazole (DIFLUCAN) tablet 150 mg     150 mg Oral  Once 10/20/14 2314 10/21/14 0012   10/20/14 2115  vancomycin (VANCOCIN) IVPB 1000 mg/200 mL premix     1,000 mg 200 mL/hr over 60 Minutes Intravenous   Once 10/20/14 2102 10/20/14 2249        Objective:   Filed Vitals:   10/21/14 2237 10/22/14 0621 10/22/14 2109 10/23/14 0451  BP: 164/72 142/65 181/75 170/74  Pulse: 100 91 88 92  Temp: 98.6 F (37 C) 98.3 F (36.8 C) 98.6 F (37 C) 97.9 F (36.6 C)  TempSrc: Oral Oral Oral Oral  Resp: Height:      Weight:      SpO2: 97% 99% 97% 97%    Wt Readings from Last 3 Encounters:  10/20/14 79.9 kg (176 lb 2.4 oz)     Intake/Output Summary (Last 24 hours) at 10/23/14 1044 Last data filed at 10/23/14 0700  Gross per 24 hour  Intake 2043.75 ml  Output    950 ml  Net 1093.75 ml     Physical Exam  Awake Alert, Oriented X 3, No new F.N deficits, Normal affect [place].AT,PERRAL Supple Neck,No JVD, No cervical lymphadenopathy appriciated.  Symmetrical Chest wall movement, Good air movement bilaterally, CTAB RRR,No Gallops,Rubs or new Murmurs, No Parasternal Heave +ve B.Sounds, Abd Soft, No tenderness, No organomegaly appriciated, No rebound - guarding or rigidity. No Cyanosis, Clubbing or edema, No new Rash or bruise  Her right first to has a large dry black ulcer on the plantar aspect, appears chronic, also several wounds over the heel which also appear chronic, poor pulsations in both feet.    Data Review:   Micro Results Recent Results (from the past 240 hour(s))  Culture, blood (x 2)     Status: None (Preliminary result)   Collection Time: 10/20/14 11:43 PM  Result Value Ref Range Status   Specimen Description BLOOD RIGHT HAND  Final   Special Requests BOTTLES DRAWN AEROBIC AND ANAEROBIC 10CC  Final   Culture NO GROWTH 1 DAY  Final   Report Status PENDING  Incomplete  Culture, blood (x 2)     Status: None (Preliminary result)   Collection Time: 10/20/14 11:55 PM  Result Value Ref Range Status   Specimen Description BLOOD RIGHT HAND  Final   Special Requests BOTTLES DRAWN AEROBIC AND ANAEROBIC 10CC  Final   Culture NO GROWTH 1 DAY  Final   Report Status  PENDING  Incomplete    Radiology Reports Dg Ankle Complete Right  10/21/2014   CLINICAL DATA:  59 year old female with ulcers on the right great toe and heel. Generalized ankle pain. Diabetes and cellulitis. Initial encounter.  EXAM: RIGHT ANKLE - COMPLETE 3+ VIEW  COMPARISON:  Right foot series 10/20/2014. Right foot MRI 0038 hours today reported separately.  FINDINGS: Chronic posttraumatic and degenerative changes at the right ankle with mortise joint space loss and bulky osteophytosis. Distal right tibia cannulated screw appears stable and intact.  The calcaneus appears intact with bulky degenerative spurring. No osteolysis identified. No acute fracture identified.  IMPRESSION: Advanced posttraumatic and degenerative changes about the right ankle. No radiographic evidence of osteomyelitis or acute osseous abnormality identified.   Electronically Signed   By: Odessa FlemingH  Hall M.D.   On: 10/21/2014 19:16   Mr Foot Right W Wo Contrast  10/21/2014   CLINICAL DATA:  Diabetic foot infection progressively worsening. Constant.  EXAM: MRI OF THE RIGHT FOREFOOT WITHOUT AND WITH CONTRAST  TECHNIQUE: Multiplanar, multisequence MR imaging was performed both before and after administration of intravenous contrast.  CONTRAST:  15mL MULTIHANCE GADOBENATE DIMEGLUMINE 529 MG/ML IV SOLN  COMPARISON:  None.  FINDINGS: There is a distal tibial lag screw with resulting susceptibility artifact.  There is soft tissue edema without significant enhancement involving the dorsal lateral midfoot extending towards the forefoot. There is a soft tissue ulcer along the plantar aspect of the first IP joint.  There is no focal fluid collection or hematoma.  There is a osteochondral lesion involving the lateral talar dome measuring approximately 12 mm with mild surrounding marrow edema and subchondral cystic changes. There is high-grade partial-thickness versus full-thickness cartilage loss of the lateral tibiotalar joint.  There is minimal marrow  edema within the first distal phalanx with equivocal enhancement, but no definite bone destruction. There is no joint effusion. There is no other focal marrow signal abnormality.  The visualized flexor, extensor and peroneal tendons are intact.  IMPRESSION: 1. Soft tissue edema without significant enhancement involving the dorsal lateral midfoot extending towards the forefoot which may be reactive versus secondary to mild cellulitis. No drainable fluid collection. 2. Soft tissue ulceration along the plantar aspect of the first IP joint with minimal marrow edema within the first distal phalanx without significant enhancement or definite cortical destruction. These may reflect reactive marrow changes, but early mild osteomyelitis cannot the definitively excluded. 3. Osteoarthritis of the tibiotalar joint with high-grade partial versus full-thickness cartilage loss and an osteochondral lesion involving the lateral aspect of the tibiotalar joint.   Electronically Signed   By: Elige KoHetal  Patel   On: 10/21/2014 07:59   Dg Foot Complete Right  10/20/2014   CLINICAL DATA:  Right foot pain and swelling for 3 weeks  EXAM: RIGHT FOOT COMPLETE - 3+ VIEW  COMPARISON:  None.  FINDINGS: Three views of the right foot submitted. No acute fracture or subluxation. There is diffuse osteopenia. There is old fracture deformity and metallic fixation screw in distal tibia. Significant degenerative changes tibiotalar joint. There is plantar spur of calcaneus. Dorsal spurring tarsal region.  IMPRESSION: No acute fracture or subluxation. Posttraumatic and postsurgical changes distal tibia. Significant degenerative changes tibiotalar joint. Mild dorsal spurring tarsal region. There is diffuse osteopenia. Plantar spurring of calcaneus.   Electronically Signed   By: Natasha MeadLiviu  Pop M.D.   On: 10/20/2014 21:47     CBC  Recent Labs Lab 10/20/14 2013 10/23/14 0504  WBC 16.9* 11.2*  HGB 13.2 12.2  HCT 39.0 36.1  PLT 408* 359  MCV 86.5 86.4    MCH 29.3 29.2  MCHC 33.8 33.8  RDW 12.8 12.8    Chemistries   Recent Labs Lab 10/20/14 2013 10/23/14 0504  NA 137 139  K 4.5 3.7  CL 104 105  CO2 24 27  GLUCOSE 391* 113*  BUN 15 8  CREATININE 0.85 0.58  CALCIUM 9.3 8.6*   ------------------------------------------------------------------------------------------------------------------ estimated creatinine clearance is 78.4 mL/min (by C-G formula based on Cr of 0.58). ------------------------------------------------------------------------------------------------------------------  Recent Labs  10/20/14 2120 10/21/14 1205  HGBA1C 13.2* 12.9*   ------------------------------------------------------------------------------------------------------------------ No results for input(s): CHOL, HDL, LDLCALC, TRIG, CHOLHDL, LDLDIRECT in the last 72 hours. ------------------------------------------------------------------------------------------------------------------ No results for input(s): TSH, T4TOTAL, T3FREE, THYROIDAB in the last 72 hours.  Invalid input(s): FREET3 ------------------------------------------------------------------------------------------------------------------ No results for input(s): VITAMINB12, FOLATE, FERRITIN, TIBC, IRON, RETICCTPCT in the last 72 hours.  Coagulation profile  Recent Labs Lab 10/20/14 2343  INR 1.07     Recent Labs  10/20/14 2120  DDIMER 0.58*    Cardiac Enzymes No results for input(s): CKMB, TROPONINI, MYOGLOBIN in the last 168 hours.  Invalid input(s): CK ------------------------------------------------------------------------------------------------------------------ Invalid input(s): POCBNP   Time Spent in minutes   35   Susa Raring K M.D on 10/23/2014 at 10:44 AM  Between 7am to 7pm - Pager - [phone]  After 7pm go to [email or web address] - password [facility]  [facility]   Office  [phone]

## 2014-10-23 NOTE — Progress Notes (Signed)
ANTIBIOTIC CONSULT NOTE - FOLLOW UP  Pharmacy Consult for Vancomycin and Zosyn Indication: R foot wound infection  No Known Allergies  Patient Measurements: Height: 5\' 4"  (162.6 cm) Weight: 176 lb 2.4 oz (79.9 kg) IBW/kg (Calculated) : 54.7  Vital Signs: Temp: 97.9 F (36.6 C) (07/18 0451) Temp Source: Oral (07/18 0451) BP: 170/74 mmHg (07/18 0451) Pulse Rate: 92 (07/18 0451) Intake/Output from previous day: 07/17 0701 - 07/18 0700 In: 4671.3 [P.O.:240; I.V.:4031.3; IV Piggyback:400] Out: 950 [Urine:950] Intake/Output from this shift:    Labs:  Recent Labs  10/20/14 2013 10/23/14 0504  WBC 16.9* 11.2*  HGB 13.2 12.2  PLT 408* 359  CREATININE 0.85 0.58   Estimated Creatinine Clearance: 78.4 mL/min (by C-G formula based on Cr of 0.58). No results for input(s): VANCOTROUGH, VANCOPEAK, VANCORANDOM, GENTTROUGH, GENTPEAK, GENTRANDOM, TOBRATROUGH, TOBRAPEAK, TOBRARND, AMIKACINPEAK, AMIKACINTROU, AMIKACIN in the last 72 hours.   Microbiology: Recent Results (from the past 720 hour(s))  Culture, blood (x 2)     Status: None (Preliminary result)   Collection Time: 10/20/14 11:43 PM  Result Value Ref Range Status   Specimen Description BLOOD RIGHT HAND  Final   Special Requests BOTTLES DRAWN AEROBIC AND ANAEROBIC 10CC  Final   Culture NO GROWTH 1 DAY  Final   Report Status PENDING  Incomplete  Culture, blood (x 2)     Status: None (Preliminary result)   Collection Time: 10/20/14 11:55 PM  Result Value Ref Range Status   Specimen Description BLOOD RIGHT HAND  Final   Special Requests BOTTLES DRAWN AEROBIC AND ANAEROBIC 10CC  Final   Culture NO GROWTH 1 DAY  Final   Report Status PENDING  Incomplete    Medical History: Past Medical History  Diagnosis Date  . Tobacco abuse   . Cellulitis of right foot 10/2013  . Headache     "maybe weekly" (10/20/2014)  . Arthritis     "joints in hands and right leg ache" (10/20/2014)    Medications:  Prescriptions prior to  admission  Medication Sig Dispense Refill Last Dose  . ibuprofen (ADVIL,MOTRIN) 200 MG tablet Take 400 mg by mouth every 6 (six) hours as needed for moderate pain.   10/20/2014 at Unknown time  . Melatonin 10 MG TBDP Take 20 mg by mouth at bedtime.   10/19/2014 at Unknown time  . sulfamethoxazole-trimethoprim (BACTRIM DS,SEPTRA DS) 800-160 MG per tablet Take 1 tablet by mouth 2 (two) times daily.   Completed Course at 10/17/14   Assessment: 59 y.o. female presented with R foot wound infection. Noted that she completed 1-week course of Bactrim on 7/12 (prescribed by urgent care) but no improvement in wounds seen. Afeb. WBC elevated but trended down to 11.2. SCr 0.58, est CrCl 75-80 ml/min. Right first toe ulcer with possible early osteomyelitis. Due for angiogram today.   Vanc 7/15 >> Zosyn 7/16 >> 7/15 Fluconazole 150 mg PO x 1   VT 7/19 @ 0830  BCx 7/15 > ngtd  Goal of Therapy:  Vancomycin trough level 10-15 mcg/ml  Plan:  Zosyn 3.375gm IV now over 30 min then q8h - subsequent doses over 4 hours Vancomycin 750mg  IV q12h Will f/u renal function, micro data, and pt's clinical condition Vanc trough in AM  Will adjust Vancomycin goals based on Vascular plans  Vinnie LevelBenjamin Tifanie Gardiner, PharmD., BCPS Clinical Pharmacist Pager [phone]

## 2014-10-24 ENCOUNTER — Ambulatory Visit (HOSPITAL_COMMUNITY): Payer: 59

## 2014-10-24 DIAGNOSIS — Z7982 Long term (current) use of aspirin: Secondary | ICD-10-CM

## 2014-10-24 DIAGNOSIS — I739 Peripheral vascular disease, unspecified: Secondary | ICD-10-CM

## 2014-10-24 DIAGNOSIS — E876 Hypokalemia: Secondary | ICD-10-CM | POA: Diagnosis present

## 2014-10-24 DIAGNOSIS — I70261 Atherosclerosis of native arteries of extremities with gangrene, right leg: Secondary | ICD-10-CM | POA: Diagnosis present

## 2014-10-24 DIAGNOSIS — E1151 Type 2 diabetes mellitus with diabetic peripheral angiopathy without gangrene: Principal | ICD-10-CM | POA: Diagnosis present

## 2014-10-24 DIAGNOSIS — M1990 Unspecified osteoarthritis, unspecified site: Secondary | ICD-10-CM | POA: Diagnosis present

## 2014-10-24 DIAGNOSIS — F17210 Nicotine dependence, cigarettes, uncomplicated: Secondary | ICD-10-CM | POA: Diagnosis present

## 2014-10-24 DIAGNOSIS — L97519 Non-pressure chronic ulcer of other part of right foot with unspecified severity: Secondary | ICD-10-CM | POA: Diagnosis present

## 2014-10-24 DIAGNOSIS — Z794 Long term (current) use of insulin: Secondary | ICD-10-CM

## 2014-10-24 LAB — CBC
HCT: 33.2 % — ABNORMAL LOW (ref 36.0–46.0)
Hemoglobin: 11.1 g/dL — ABNORMAL LOW (ref 12.0–15.0)
MCH: 28.9 pg (ref 26.0–34.0)
MCHC: 33.4 g/dL (ref 30.0–36.0)
MCV: 86.5 fL (ref 78.0–100.0)
Platelets: 322 10*3/uL (ref 150–400)
RBC: 3.84 MIL/uL — ABNORMAL LOW (ref 3.87–5.11)
RDW: 12.9 % (ref 11.5–15.5)
WBC: 11.6 10*3/uL — ABNORMAL HIGH (ref 4.0–10.5)

## 2014-10-24 LAB — BASIC METABOLIC PANEL
Anion gap: 7 (ref 5–15)
BUN: 7 mg/dL (ref 6–20)
CO2: 25 mmol/L (ref 22–32)
Calcium: 8.2 mg/dL — ABNORMAL LOW (ref 8.9–10.3)
Chloride: 103 mmol/L (ref 101–111)
Creatinine, Ser: 0.56 mg/dL (ref 0.44–1.00)
GFR calc Af Amer: >60 mL/min (ref >60)
GFR calc non Af Amer: >60 mL/min (ref >60)
Glucose, Bld: 186 mg/dL — ABNORMAL HIGH (ref 65–99)
Potassium: 3 mmol/L — ABNORMAL LOW (ref 3.5–5.1)
Sodium: 135 mmol/L (ref 135–145)

## 2014-10-24 LAB — GLUCOSE, CAPILLARY
Glucose-Capillary: 118 mg/dL — ABNORMAL HIGH (ref 65–99)
Glucose-Capillary: 145 mg/dL — ABNORMAL HIGH (ref 65–99)
Glucose-Capillary: 172 mg/dL — ABNORMAL HIGH (ref 65–99)
Glucose-Capillary: 231 mg/dL — ABNORMAL HIGH (ref 65–99)

## 2014-10-24 LAB — VANCOMYCIN, TROUGH: Vancomycin Tr: 10 ug/mL (ref 10.0–20.0)

## 2014-10-24 MED ORDER — POTASSIUM CHLORIDE CRYS ER 20 MEQ PO TBCR
40.0000 meq | EXTENDED_RELEASE_TABLET | ORAL | Status: DC
  Administered 2014-10-24: 40 meq via ORAL
  Filled 2014-10-24: qty 2

## 2014-10-24 MED ORDER — INSULIN GLARGINE 100 UNIT/ML ~~LOC~~ SOLN: 20.0000 [IU] | Freq: Every day | SUBCUTANEOUS | Status: DC

## 2014-10-24 MED ORDER — INSULIN ASPART 100 UNIT/ML ~~LOC~~ SOLN: SUBCUTANEOUS | Status: DC

## 2014-10-24 MED ORDER — CARVEDILOL 6.25 MG PO TABS: 6.2500 mg | ORAL_TABLET | Freq: Two times a day (BID) | ORAL | Status: DC

## 2014-10-24 MED ORDER — FREESTYLE SYSTEM KIT: 1.0000 | PACK | Freq: Three times a day (TID) | Status: DC

## 2014-10-24 MED ORDER — INSULIN LISPRO PROT & LISPRO (75-25 MIX) 100 UNIT/ML ~~LOC~~ SUSP: 25.0000 [IU] | Freq: Two times a day (BID) | SUBCUTANEOUS | Status: DC

## 2014-10-24 MED ORDER — ATORVASTATIN CALCIUM 10 MG PO TABS: 10.0000 mg | ORAL_TABLET | Freq: Every day | ORAL | Status: DC

## 2014-10-24 MED ORDER — "INSULIN SYRINGE-NEEDLE U-100 25G X 1"" 1 ML MISC": Status: DC

## 2014-10-24 MED ORDER — ASPIRIN EC 81 MG PO TBEC: 81.0000 mg | DELAYED_RELEASE_TABLET | Freq: Every day | ORAL | Status: DC

## 2014-10-24 MED ORDER — NICOTINE 21 MG/24HR TD PT24: 21.0000 mg | MEDICATED_PATCH | Freq: Every day | TRANSDERMAL | Status: DC

## 2014-10-24 MED ORDER — INSULIN STARTER KIT- SYRINGES (ENGLISH)
1.0000 | Freq: Once | Status: DC
  Filled 2014-10-24: qty 1

## 2014-10-24 MED ORDER — LIVING WELL WITH DIABETES BOOK
Freq: Once | Status: DC
  Filled 2014-10-24: qty 1

## 2014-10-24 MED ORDER — DOCUSATE SODIUM 100 MG PO CAPS: 100.0000 mg | ORAL_CAPSULE | Freq: Two times a day (BID) | ORAL | Status: DC

## 2014-10-24 MED ORDER — HYDRALAZINE HCL 25 MG PO TABS: 25.0000 mg | ORAL_TABLET | Freq: Three times a day (TID) | ORAL | Status: DC

## 2014-10-24 MED ORDER — SULFAMETHOXAZOLE-TRIMETHOPRIM 800-160 MG PO TABS: 1.0000 | ORAL_TABLET | Freq: Two times a day (BID) | ORAL | Status: DC

## 2014-10-24 MED FILL — Lidocaine HCl Local Preservative Free (PF) Inj 1%: INTRAMUSCULAR | Qty: 30 | Status: AC

## 2014-10-24 NOTE — Plan of Care (Signed)
Problem: Food- and Nutrition-Related Knowledge Deficit (NB-1.1) Goal: Nutrition education Formal process to instruct or train a patient/client in a skill or to impart knowledge to help patients/clients voluntarily manage or modify food choices and eating behavior to maintain or improve health. Outcome: Completed/Met Date Met:  10/24/14  RD consulted for nutrition education regarding diabetes.     Lab Results  Component Value Date    HGBA1C 12.9* 10/21/2014    RD provided "Carbohydrate Counting for People with Diabetes" handout from the Academy of [employer]etics. Discussed different food groups and their effects on blood sugar, emphasizing carbohydrate-containing foods. Provided list of carbohydrates and recommended serving sizes of common foods.  Discussed importance of controlled and consistent carbohydrate intake throughout the day. Provided examples of ways to balance meals/snacks and encouraged intake of high-fiber, whole grain complex carbohydrates. Teach back method used.  Expect fair compliance.  Body mass index is 30.52 kg/(m^2). Pt meets criteria for Obesity Class I based on current BMI.  Current diet order is Heart Healthy. Labs and medications reviewed. No further nutrition interventions warranted at this time. RD contact information provided. If additional nutrition issues arise, please re-consult RD.  Arthur Holms, RD, LDN Pager #: [phone] After-Hours Pager #: [phone]

## 2014-10-24 NOTE — Progress Notes (Signed)
PT Cancellation Note  Patient Details Name: Shirley Chen MRN: [id number] DOB: Aug 29, 1955   Cancelled Treatment:    Reason Eval/Treat Not Completed: Pt waiting to d/c this morning. She reports that after her procedure the pain is lessened in her R foot. It was recommended that pt have a RW for home and pt declines this, stating her cane will be sufficient. This therapist answered pt and family's questions regarding rehab process after procedure planned on Friday 7/22. Will continue to follow and progress as able per POC.    Conni SlipperKirkman, Doretha Goding 10/24/2014, 11:44 AM   Conni SlipperLaura Lougenia Morrissey, PT, DPT Acute Rehabilitation Services Pager: [phone]
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
## 2014-10-24 NOTE — Discharge Instructions (Signed)
Follow with Primary MD in 7 days  ° °Get CBC, CMP, 2 view Chest X ray checked  by Primary MD next visit.  ° ° °Activity: As tolerated with Full fall precautions use walker/cane & assistance as needed ° ° °Disposition Home   ° ° °Diet:   Heart Healthy Low Carb. ° °Accuchecks 4 times/day, Once in AM empty stomach and then before each meal. °Log in all results and show them to your Prim.MD in 3 days. °If any glucose reading is under 80 or above 300 call your Prim MD immidiately. °Follow Low glucose instructions for glucose under 80 as instructed. ° ° °For Heart failure patients - Check your Weight same time everyday, if you gain over 2 pounds, or you develop in leg swelling, experience more shortness of breath or chest pain, call your Primary MD immediately. Follow Cardiac Low Salt Diet and 1.5 lit/day fluid restriction. ° ° °On your next visit with your primary care physician please Get Medicines reviewed and adjusted. ° ° °Please request your Prim.MD to go over all Hospital Tests and Procedure/Radiological results at the follow up, please get all Hospital records sent to your Prim MD by signing hospital release before you go home. ° ° °If you experience worsening of your admission symptoms, develop shortness of breath, life threatening emergency, suicidal or homicidal thoughts you must seek medical attention immediately by calling 911 or calling your MD immediately  if symptoms less severe. ° °You Must read complete instructions/literature along with all the possible adverse reactions/side effects for all the Medicines you take and that have been prescribed to you. Take any new Medicines after you have completely understood and accpet all the possible adverse reactions/side effects.  ° °Do not drive, operating heavy machinery, perform activities at heights, swimming or participation in water activities or provide baby sitting services if your were admitted for syncope or siezures until you have seen by Primary MD or  a Neurologist and advised to do so again. ° °Do not drive when taking Pain medications.  ° ° °Do not take more than prescribed Pain, Sleep and Anxiety Medications ° °Special Instructions: If you have smoked or chewed Tobacco  in the last 2 yrs please stop smoking, stop any regular Alcohol  and or any Recreational drug use. ° °Wear Seat belts while driving. ° ° °Please note ° °You were cared for by a hospitalist during your hospital stay. If you have any questions about your discharge medications or the care you received while you were in the hospital after you are discharged, you can call the unit and asked to speak with the hospitalist on call if the hospitalist that took care of you is not available. Once you are discharged, your primary care physician will handle any further medical issues. Please note that NO REFILLS for any discharge medications will be authorized once you are discharged, as it is imperative that you return to your primary care physician (or establish a relationship with a primary care physician if you do not have one) for your aftercare needs so that they can reassess your need for medications and monitor your lab values. ° °

## 2014-10-24 NOTE — Progress Notes (Signed)
   VASCULAR SURGERY ASSESSMENT & PLAN:  * I have reviewed her arteriogram from yesterday. I have recommended a right femoral to anterior tibial artery bypass which I could do on Friday. I will try to get her vein mapping done this morning. If she is otherwise ready for discharge, she could be discharged today and I could bring her back Friday for surgery. I have reviewed the indications for lower extremity bypass. I have also reviewed the potential complications of surgery including but not limited to: wound healing problems, infection, graft thrombosis, limb loss, or other unpredictable medical problems. All the patient's questions were answered and they are agreeable to proceed.  SUBJECTIVE: No complaints  PHYSICAL EXAM: Filed Vitals:   10/23/14 1431 10/23/14 1556 10/23/14 2032 10/24/14 0400  BP: 163/91 143/67 134/86 151/74  Pulse: 96 92  96  Temp:  97.6 F (36.4 C) 98 F (36.7 C) 98.9 F (37.2 C)  TempSrc:  Oral Oral Oral  Resp: 10 10 15 17   Height:      Weight:    177 lb 14.4 oz (80.695 kg)  SpO2: 97% 99% 97% 97%   Wounds on right foot are unchanged.  LABS: Lab Results  Component Value Date   WBC 11.6* 10/24/2014   HGB 11.1* 10/24/2014   HCT 33.2* 10/24/2014   MCV 86.5 10/24/2014   PLT 322 10/24/2014   Lab Results  Component Value Date   CREATININE 0.56 10/24/2014   Lab Results  Component Value Date   INR 1.07 10/20/2014   CBG (last 3)   Recent Labs  10/24/14 0015 10/24/14 0414 10/24/14 0629  GLUCAP 231* 172* 145*   ARTERIOGRAM: This shows some moderate to severe superficial femoral artery occlusive disease. She also has an occluded popliteal artery with severe tibial artery occlusive disease. It appears that the only option for revascularization would be a fem to anterior tibial artery bypass.  Principal Problem:   Right foot infection Active Problems:   Sepsis   Tobacco abuse   Hyperglycemia   Vaginal itching   Elevated blood pressure  Shirley CarawayChris  Shirley Chen Beeper: [phone] 10/24/2014

## 2014-10-24 NOTE — Care Management Note (Addendum)
Case Management Note  Patient Details  Name: Shirley Chen MRN: [id number] Date of Birth: Aug 31, 1955  Subjective/Objective:   Pt admitted with right foot infection.  Pt is newly diagnosed diabetic                  Action/Plan:  Pt is independent from home with son.  Pt does have insurance coverage, however; pt does not have PCP.  CM [facility]  CM will be unable to provide medication assitance via match as requested as pt has active insurance coverage.  CM will however submit benefit check to determine copay for non generic medications listed on AVR.   Expected Discharge Date:                  Expected Discharge Plan:  Home/Self Care  In-House Referral:     Discharge planning Services  CM Consult, Indigent Health Clinic, Medication Assistance  Post Acute Care Choice:    Choice offered to:     DME Arranged:    DME Agency:     HH Arranged:    HH Agency:     Status of Service:  Complete, will sign off  Medicare Important Message Given:  No Date Medicare IM Given:    Medicare IM give by:    Date Additional Medicare IM Given:    Additional Medicare Important Message give by:     Disposition Plan:  Home with Self Care  If discussed at Long Length of Stay Meetings, dates discussed:    Additional Comments: CM consulted for medication assistance, request specific for newly prescribed Diabetic mediations.  Pt has active insurance, therefore subsidy assistance can not be provided.  [facility] and was able to arrange PCP appointment for tomorrow 10/25/14 at 3pm.  CM faxed insurance cards to Admitting.  CM contacted DM Coordinator, Coordinator asked MD to change long acting insulin to either NPH or 75/25 that can be covered with free sample voucher card where pts copay will be $0 for one month.    Coordinator also asked CM to relay Reli On  Meter information to pt (Less expensive [employer] Brand).   CM spoke with both DM Coordinator and MD,  verified pt will go home on Humalog 75/25 as the only insulin coverage  (pt will not go home on Novolog nor Lantus as previously ordered).  CM provided pt and son; [facility] appt date [facility]/20 at 2:45pm in addition to online picture print out of glucometer listed above.  CM reviewed with pt the [employer] Free Sample Voucher Card to be used in conjunction with prescription, card is good for one month, CM informed pt and son that [facility] could assist pt in finding an affordable alternative if monthly copay would cause hardship, CM instructed pt to speak with PCP tomorrow about this to eliminate possibility of gap in insulin coverage post 30 free day supply.  CM reviewed all other medications listed on AVR, some medications were on the [employer] generic copay program, [facility].  CM practiced teach back method with pt regarding discharge plan, bedside nurse present.  Pt refused HH RN and RW, pt asked if she could refuse, CM informed pt it was her choice, and that the RN would help her manage her newly diagnosed DM, pt stated "I refuse, I don't want it".  No other CM needs  Cherylann ParrClaxton, Yaman Grauberger S, RN 10/24/2014,  10:28 AM

## 2014-10-24 NOTE — Progress Notes (Signed)
UR Completed. Hector Venne, RN, BSN.  [phone] 

## 2014-10-24 NOTE — Progress Notes (Signed)
Inpatient Diabetes Program Recommendations  AACE/ADA: New Consensus Statement on Inpatient Glycemic Control (2013)  Target Ranges:  Prepandial:   less than 140 mg/dL      Peak postprandial:   less than 180 mg/dL (1-2 hours)      Critically ill patients:  140 - 180 mg/dL   Results for Shirley Chen, Shirley Chen (MRN [id number]) as of 10/24/2014 10:48  Ref. Range 10/24/2014 00:15 10/24/2014 04:14 10/24/2014 06:29  Glucose-Capillary Latest Ref Range: 65-99 mg/dL 231 (H) 172 (H) 145 (H)    Results for Shirley Chen, Shirley Chen (MRN [id number]) as of 10/24/2014 10:48  Ref. Range 10/21/2014 12:05  Hemoglobin A1C Latest Ref Range: 4.8-5.6 % 12.9 (H)     -New diagnosis of DM this admission.  A1c 12.9%.  -Spoke with pt about new diagnosis.  Discussed A1C results with her and explained what an A1C is, basic pathophysiology of DM Type 2, basic home care, basic diabetes diet nutrition principles, importance of checking CBGs and maintaining good CBG control to prevent long-term and short-term complications.  Reviewed signs and symptoms of hyperglycemia and hypoglycemia and how to treat hypoglycemia at home.  Also reviewed blood sugar goals at home.  Patient stated her daughter and many of her family members have DM so she is somewhat familiar with DM concepts.    -RNs to provide ongoing basic DM education at bedside with this patient.  Have ordered educational booklet, insulin starter kit, and DM videos.  Have also placed RD consult for DM diet education for this patient.  -Educated patient on insulin pen use at home.  Reviewed all steps if insulin pen including attachment of needle, 2-unit air shot, dialing up dose, giving injection, removing needle, disposal of sharps, storage of unused insulin, disposal of insulin etc.  Patient able to provide successful return demonstration.  Also reviewed troubleshooting with insulin pen.  MD to give patient Rxs for insulin pens and insulin pen needles.  -Also showed patient how to draw up  insulin with a syringe as well.  Showed patient how to draw up air, inject air into vial, draw up insulin, and inject into practice dome.  Patient demonstrated competency with both methods of insulin administration.  RN to continue working with patient on insulin injections before d/c.  -Spoke with care management about this patient.  Per care management, they will try to get her a follow- up appt at the [facility] clinic after d/c.  Patient has insurance but the Rx coverage is not that comprehensive.  Per care manager, patient will not be able to afford both Novolog and Lantus and the CBG meter.  Asked care manager to tell patient about the Reli-on Prime CBG meter at [facility]: [address] that patient can buy OTC.  Meter is $16 and a box of 50 strips is $9.  Care manager stated she had a coupon that patient could use to get a free pack of Humalog 75/25 insulin pens after d/c.  Called Dr. Candiss Norse to discuss.  Relayed all the information to Dr. Candiss Norse that the care manager discussed with me.  Dr. Candiss Norse to switch patient to Humalog 75/25 insulin pens at time of d/c.  Will alert RN caring for patient to print a new AVS with the new Rx for insulin.    Will follow Wyn Quaker RN, MSN, CDE Diabetes Coordinator Inpatient Glycemic Control Team Team Pager: [phone] (8a-5p)

## 2014-10-24 NOTE — Progress Notes (Signed)
VASCULAR LAB PRELIMINARY  PRELIMINARY  PRELIMINARY  PRELIMINARY    Right Lower Extremity Vein Map    Right Great Saphenous Vein   Segment Diameter Comment  1. Origin 6.7767mm   2. High Thigh 5.4713mm   3. Mid Thigh 4.10722mm Branch   4. Low Thigh 3.748mm   5. At Knee 3.6258mm   6. High Calf 3.3549mm   7. Low Calf 4.4808mm Branch   8. Ankle 4.4731mm Runs anteriorly  GSV anterior from origin 1. 6.6978mm   GSV 2. 5.1205mm Branch   GSV 3. mm Joins distally into GSV    Right Small Saphenous Vein  Segment Diameter Comment  1. Origin 6.3917mm   2. High Calf 4.2446mm   3. Low Calf 5.8005mm Branch   4. Ankle 3.5157mm    mm    mm    mm        Farrel DemarkJill Eunice, RDMS, RVT  10/24/2014, 10:59 AM

## 2014-10-25 ENCOUNTER — Other Ambulatory Visit: Payer: Self-pay

## 2014-10-25 ENCOUNTER — Encounter: Payer: Self-pay | Admitting: Internal Medicine

## 2014-10-25 ENCOUNTER — Ambulatory Visit: Payer: 59

## 2014-10-25 ENCOUNTER — Ambulatory Visit (HOSPITAL_BASED_OUTPATIENT_CLINIC_OR_DEPARTMENT_OTHER): Payer: 59 | Admitting: Internal Medicine

## 2014-10-25 VITALS — BP 134/81 | HR 91 | Temp 98.0°F | Resp 16 | Wt 181.4 lb

## 2014-10-25 DIAGNOSIS — L97519 Non-pressure chronic ulcer of other part of right foot with unspecified severity: Secondary | ICD-10-CM

## 2014-10-25 DIAGNOSIS — E139 Other specified diabetes mellitus without complications: Secondary | ICD-10-CM

## 2014-10-25 DIAGNOSIS — Z720 Tobacco use: Secondary | ICD-10-CM

## 2014-10-25 DIAGNOSIS — I739 Peripheral vascular disease, unspecified: Secondary | ICD-10-CM

## 2014-10-25 DIAGNOSIS — Z139 Encounter for screening, unspecified: Secondary | ICD-10-CM

## 2014-10-25 DIAGNOSIS — E876 Hypokalemia: Secondary | ICD-10-CM

## 2014-10-25 LAB — URINE CULTURE: Culture: NO GROWTH

## 2014-10-25 LAB — CBC WITH DIFFERENTIAL/PLATELET
Basophils Absolute: 0 10*3/uL (ref 0.0–0.1)
Basophils Relative: 0 % (ref 0–1)
Eosinophils Absolute: 0.4 10*3/uL (ref 0.0–0.7)
Eosinophils Relative: 3 % (ref 0–5)
HCT: 32.2 % — ABNORMAL LOW (ref 36.0–46.0)
Hemoglobin: 11 g/dL — ABNORMAL LOW (ref 12.0–15.0)
Lymphocytes Relative: 11 % — ABNORMAL LOW (ref 12–46)
Lymphs Abs: 1.4 10*3/uL (ref 0.7–4.0)
MCH: 28.9 pg (ref 26.0–34.0)
MCHC: 34.2 g/dL (ref 30.0–36.0)
MCV: 84.7 fL (ref 78.0–100.0)
MPV: 8.3 fL — ABNORMAL LOW (ref 8.6–12.4)
Monocytes Absolute: 0.8 10*3/uL (ref 0.1–1.0)
Monocytes Relative: 6 % (ref 3–12)
Neutro Abs: 10.1 10*3/uL — ABNORMAL HIGH (ref 1.7–7.7)
Neutrophils Relative %: 80 % — ABNORMAL HIGH (ref 43–77)
Platelets: 394 10*3/uL (ref 150–400)
RBC: 3.8 MIL/uL — ABNORMAL LOW (ref 3.87–5.11)
RDW: 13.4 % (ref 11.5–15.5)
WBC: 12.6 10*3/uL — ABNORMAL HIGH (ref 4.0–10.5)

## 2014-10-25 LAB — COMPLETE METABOLIC PANEL WITH GFR
ALT: 24 U/L (ref 0–35)
AST: 28 U/L (ref 0–37)
Albumin: 2.8 g/dL — ABNORMAL LOW (ref 3.5–5.2)
Alkaline Phosphatase: 142 U/L — ABNORMAL HIGH (ref 39–117)
BUN: 17 mg/dL (ref 6–23)
CO2: 24 mEq/L (ref 19–32)
Calcium: 8.7 mg/dL (ref 8.4–10.5)
Chloride: 108 mEq/L (ref 96–112)
Creat: 0.62 mg/dL (ref 0.50–1.10)
GFR, Est African American: >89 mL/min
GFR, Est Non African American: >89 mL/min
Glucose, Bld: 115 mg/dL — ABNORMAL HIGH (ref 70–99)
Potassium: 3.7 mEq/L (ref 3.5–5.3)
Sodium: 144 mEq/L (ref 135–145)
Total Bilirubin: 0.6 mg/dL (ref 0.2–1.2)
Total Protein: 5.8 g/dL — ABNORMAL LOW (ref 6.0–8.3)

## 2014-10-25 LAB — GLUCOSE, POCT (MANUAL RESULT ENTRY): POC Glucose: 146 mg/dl — AB (ref 70–99)

## 2014-10-25 NOTE — Progress Notes (Signed)
Patient Demographics  Shirley Chen, is a 59 y.o. female  JJH:[id number]  YJE:[id number]  DOB - December 22, 1955  CC:  Chief Complaint  Patient presents with  . Hospitalization Follow-up       HPI: Shirley Chen is a 59 y.o. female here today to establish medical care.Patient was recently hospitalized with symptoms of right foot her cellulitis/several ulcers, EMR reviewed patient was initially started on IV antibiotics, orthopedics/vascular surgery on board patient had arthrogram done which showed severe PAD, patient was also diagnosed with diabetes with hemoglobin A1c of 12.5%, she has been placed on insulin 25 units twice a day, patient has also been using nicotine patch and trying to quit smoking, for blood pressure she has been started on Coreg and hydralazine, she was discharged yesterday and was advised to follow with vascular surgery and is scheduled for bypass graft. Patient currently denies any acute symptoms, as per patient her blood sugar in the morning was 79 mg/dL, I have educated patient about hypoglycemia symptoms, patient understands and will decrease the dose of insulin if she has frequent low blood sugar levels. Patient has No headache, No chest pain, No abdominal pain - No Nausea, No new weakness tingling or numbness, No Cough - SOB.  No Known Allergies Past Medical History  Diagnosis Date  . Tobacco abuse   . Cellulitis of right foot 10/2013  . Headache     "maybe weekly" (10/20/2014)  . Arthritis     "joints in hands and right leg ache" (10/20/2014)   Current Outpatient Prescriptions on File Prior to Visit  Medication Sig Dispense Refill  . aspirin EC 81 MG tablet Take 1 tablet (81 mg total) by mouth daily. 30 tablet 0  . atorvastatin (LIPITOR) 10 MG tablet Take 1 tablet (10 mg total) by mouth daily. 30 tablet 0  . carvedilol (COREG) 6.25 MG tablet Take 1 tablet (6.25 mg total) by mouth 2 (two) times daily with a meal. 60 tablet 0  . glucose monitoring kit  (FREESTYLE) monitoring kit 1 each by Does not apply route 4 (four) times daily - after meals and at bedtime. 1 month Diabetic Testing Supplies for QAC-QHS accuchecks. Any brand OK. Diagnosis E11.65 1 each 1  . hydrALAZINE (APRESOLINE) 25 MG tablet Take 1 tablet (25 mg total) by mouth every 8 (eight) hours. 90 tablet 0  . insulin lispro protamine-lispro (HUMALOG 75/25 MIX) (75-25) 100 UNIT/ML SUSP injection Inject 25 Units into the skin 2 (two) times daily with a meal. Dx E11.65, can switch to any approved brand 10 mL 11  . Insulin Syringe-Needle U-100 25G X 1" 1 ML MISC For 4 times a day insulin SQ, 1 month supply. Diagnosis E11.65 30 each 0  . Melatonin 10 MG TBDP Take 20 mg by mouth at bedtime.    . nicotine (NICODERM CQ - DOSED IN MG/24 HOURS) 21 mg/24hr patch Place 1 patch (21 mg total) onto the skin daily. 28 patch 0  . sulfamethoxazole-trimethoprim (BACTRIM DS,SEPTRA DS) 800-160 MG per tablet Take 1 tablet by mouth 2 (two) times daily. 12 tablet 0   No current facility-administered medications on file prior to visit.   Family History  Problem Relation Age of Onset  . Diabetes Mother   . Lung cancer Brother    History   Social History  . Marital Status: Widowed    Spouse Name: N/A  . Number of Children: N/A  . Years of Education: N/A   Occupational History  . Not on file.  Social History Main Topics  . Smoking status: Current Every Day Smoker -- 1.00 packs/day for 49 years    Types: Cigarettes  . Smokeless tobacco: Never Used  . Alcohol Use: No  . Drug Use: No  . Sexual Activity: No   Other Topics Concern  . Not on file   Social History Narrative    Review of Systems: Constitutional: Negative for fever, chills, diaphoresis, activity change, appetite change and fatigue. HENT: Negative for ear pain, nosebleeds, congestion, facial swelling, rhinorrhea, neck pain, neck stiffness and ear discharge.  Eyes: Negative for pain, discharge, redness, itching and visual  disturbance. Respiratory: Negative for cough, choking, chest tightness, shortness of breath, wheezing and stridor.  Cardiovascular: Negative for chest pain, palpitations and leg swelling. Gastrointestinal: Negative for abdominal distention. Genitourinary: Negative for dysuria, urgency, frequency, hematuria, flank pain, decreased urine volume, difficulty urinating and dyspareunia.  Musculoskeletal: Negative for back pain, joint swelling, arthralgia and gait problem. Neurological: Negative for dizziness, tremors, seizures, syncope, facial asymmetry, speech difficulty, weakness, light-headedness, numbness and headaches.  Hematological: Negative for adenopathy. Does not bruise/bleed easily. Psychiatric/Behavioral: Negative for hallucinations, behavioral problems, confusion, dysphoric mood, decreased concentration and agitation.    Objective:   Filed Vitals:   10/25/14 1516  BP: 134/81  Pulse: 91  Temp: 98 F (36.7 C)  Resp: 16    Physical Exam: Constitutional: Patient appears well-developed and well-nourished. No distress. HENT: Normocephalic, atraumatic, External right and left ear normal. Oropharynx is clear and moist.  Eyes: Conjunctivae and EOM are normal. PERRLA, no scleral icterus. Neck: Normal ROM. Neck supple. No JVD. No tracheal deviation. No thyromegaly. CVS: RRR, S1/S2 +, no murmurs, no gallops, no carotid bruit.  Pulmonary: Effort and breath sounds normal, no stridor, rhonchi, wheezes, rales.  Abdominal: Soft. BS +, no distension, tenderness, rebound or guarding.  Musculoskeletal: Normal range of motion. Right foot has also on the plantar aspect, no apparent discharge Neuro: Alert. Normal reflexes, muscle tone coordination. No cranial nerve deficit. Skin: Skin is warm and dry. No rash noted. Not diaphoretic. No erythema. No pallor. Psychiatric: Normal mood and affect. Behavior, judgment, thought content normal.  Lab Results  Component Value Date   WBC 11.6* 10/24/2014    HGB 11.1* 10/24/2014   HCT 33.2* 10/24/2014   MCV 86.5 10/24/2014   PLT 322 10/24/2014   Lab Results  Component Value Date   CREATININE 0.56 10/24/2014   BUN 7 10/24/2014   NA 135 10/24/2014   K 3.0* 10/24/2014   CL 103 10/24/2014   CO2 25 10/24/2014    Lab Results  Component Value Date/Time   HGBA1C 12.9* 10/21/2014 12:05 PM   Lipid Panel  No results found for: CHOL, TRIG, HDL, CHOLHDL, VLDL, LDLCALC     Assessment and plan:   1. Other specified diabetes mellitus without complications Results for orders placed or performed in visit on 10/25/14  Glucose (CBG)  Result Value Ref Range   POC Glucose 146 (A) 70 - 99 mg/dl   Newly diagnosed diabetes, recent A1c of 12.9%,Continue with insulin 25 units twice a day, advise patient for diabetes meal planning, keep the fingerstick log bring on the next visit, she will reduce the dose of insulin if her blood sugar drops frequently less than 80 mg/dL, she understand and verbalized instructions.  2. Tobacco abuse Continue using nicotine patch.  3. PAD (peripheral artery disease)/4. Foot ulcer, right Patient is currently on antibiotic Patient is scheduled to follow with vascular and is going to get bypass graft.  5. Hypokalemia Repeat blood chemistry. - COMPLETE METABOLIC PANEL WITH GFR  6. Screening  - CBC with Differential/Platelet - Vit D  25 hydroxy (rtn osteoporosis monitoring)   Return in about 3 months (around 01/25/2015), or if symptoms worsen or fail to improve, for diabetes.    The patient was given clear instructions to go to ER or return to medical center if symptoms don't improve, worsen or new problems develop. The patient verbalized understanding. The patient was told to call to get lab results if they haven't heard anything in the next week.    This note has been created with Surveyor, quantity. Any transcriptional errors are unintentional.   Lorayne Marek, MD

## 2014-10-25 NOTE — Patient Instructions (Signed)
Diabetes Mellitus and Food It is important for you to manage your blood sugar (glucose) level. Your blood glucose level can be greatly affected by what you eat. Eating healthier foods in the appropriate amounts throughout the day at about the same time each day will help you control your blood glucose level. It can also help slow or prevent worsening of your diabetes mellitus. Healthy eating may even help you improve the level of your blood pressure and reach or maintain a healthy weight.  HOW CAN FOOD AFFECT ME? Carbohydrates Carbohydrates affect your blood glucose level more than any other type of food. Your dietitian will help you determine how many carbohydrates to eat at each meal and teach you how to count carbohydrates. Counting carbohydrates is important to keep your blood glucose at a healthy level, especially if you are using insulin or taking certain medicines for diabetes mellitus. Alcohol Alcohol can cause sudden decreases in blood glucose (hypoglycemia), especially if you use insulin or take certain medicines for diabetes mellitus. Hypoglycemia can be a life-threatening condition. Symptoms of hypoglycemia (sleepiness, dizziness, and disorientation) are similar to symptoms of having too much alcohol.  If your health care provider has given you approval to drink alcohol, do so in moderation and use the following guidelines:  Women should not have more than one drink per day, and men should not have more than two drinks per day. One drink is equal to:  12 oz of beer.  5 oz of wine.  1 oz of hard liquor.  Do not drink on an empty stomach.  Keep yourself hydrated. Have water, diet soda, or unsweetened iced tea.  Regular soda, juice, and other mixers might contain a lot of carbohydrates and should be counted. WHAT FOODS ARE NOT RECOMMENDED? As you make food choices, it is important to remember that all foods are not the same. Some foods have fewer nutrients per serving than other  foods, even though they might have the same number of calories or carbohydrates. It is difficult to get your body what it needs when you eat foods with fewer nutrients. Examples of foods that you should avoid that are high in calories and carbohydrates but low in nutrients include:  Trans fats (most processed foods list trans fats on the Nutrition Facts label).  Regular soda.  Juice.  Candy.  Sweets, such as cake, pie, doughnuts, and cookies.  Fried foods. WHAT FOODS CAN I EAT? Have nutrient-rich foods, which will nourish your body and keep you healthy. The food you should eat also will depend on several factors, including:  The calories you need.  The medicines you take.  Your weight.  Your blood glucose level.  Your blood pressure level.  Your cholesterol level. You also should eat a variety of foods, including:  Protein, such as meat, poultry, fish, tofu, nuts, and seeds (lean animal proteins are best).  Fruits.  Vegetables.  Dairy products, such as milk, cheese, and yogurt (low fat is best).  Breads, grains, pasta, cereal, rice, and beans.  Fats such as olive oil, trans fat-free margarine, canola oil, avocado, and olives. DOES EVERYONE WITH DIABETES MELLITUS HAVE THE SAME MEAL PLAN? Because every person with diabetes mellitus is different, there is not one meal plan that works for everyone. It is very important that you meet with a dietitian who will help you create a meal plan that is just right for you. Document Released: 12/19/2004 Document Revised: 03/29/2013 Document Reviewed: 02/18/2013 ExitCare Patient Information 2015 [employer]. This   information is not intended to replace advice given to you by your health care provider. Make sure you discuss any questions you have with your health care provider. DASH Eating Plan DASH stands for "Dietary Approaches to Stop Hypertension." The DASH eating plan is a healthy eating plan that has been shown to reduce high  blood pressure (hypertension). Additional health benefits may include reducing the risk of type 2 diabetes mellitus, heart disease, and stroke. The DASH eating plan may also help with weight loss. WHAT DO I NEED TO KNOW ABOUT THE DASH EATING PLAN? For the DASH eating plan, you will follow these general guidelines:  Choose foods with a percent daily value for sodium of less than 5% (as listed on the food label).  Use salt-free seasonings or herbs instead of table salt or sea salt.  Check with your health care provider or pharmacist before using salt substitutes.  Eat lower-sodium products, often labeled as "lower sodium" or "no salt added."  Eat fresh foods.  Eat more vegetables, fruits, and low-fat dairy products.  Choose whole grains. Look for the word "whole" as the first word in the ingredient list.  Choose fish and skinless chicken or turkey more often than red meat. Limit fish, poultry, and meat to 6 oz (170 g) each day.  Limit sweets, desserts, sugars, and sugary drinks.  Choose heart-healthy fats.  Limit cheese to 1 oz (28 g) per day.  Eat more home-cooked food and less restaurant, buffet, and fast food.  Limit fried foods.  Cook foods using methods other than frying.  Limit canned vegetables. If you do use them, rinse them well to decrease the sodium.  When eating at a restaurant, ask that your food be prepared with less salt, or no salt if possible. WHAT FOODS CAN I EAT? Seek help from a dietitian for individual calorie needs. Grains Whole grain or whole wheat bread. Brown rice. Whole grain or whole wheat pasta. Quinoa, bulgur, and whole grain cereals. Low-sodium cereals. Corn or whole wheat flour tortillas. Whole grain cornbread. Whole grain crackers. Low-sodium crackers. Vegetables Fresh or frozen vegetables (raw, steamed, roasted, or grilled). Low-sodium or reduced-sodium tomato and vegetable juices. Low-sodium or reduced-sodium tomato sauce and paste. Low-sodium  or reduced-sodium canned vegetables.  Fruits All fresh, canned (in natural juice), or frozen fruits. Meat and Other Protein Products Ground beef (85% or leaner), grass-fed beef, or beef trimmed of fat. Skinless chicken or turkey. Ground chicken or turkey. Pork trimmed of fat. All fish and seafood. Eggs. Dried beans, peas, or lentils. Unsalted nuts and seeds. Unsalted canned beans. Dairy Low-fat dairy products, such as skim or 1% milk, 2% or reduced-fat cheeses, low-fat ricotta or cottage cheese, or plain low-fat yogurt. Low-sodium or reduced-sodium cheeses. Fats and Oils Tub margarines without trans fats. Light or reduced-fat mayonnaise and salad dressings (reduced sodium). Avocado. Safflower, olive, or canola oils. Natural peanut or almond butter. Other Unsalted popcorn and pretzels. The items listed above may not be a complete list of recommended foods or beverages. Contact your dietitian for more options. WHAT FOODS ARE NOT RECOMMENDED? Grains White bread. White pasta. White rice. Refined cornbread. Bagels and croissants. Crackers that contain trans fat. Vegetables Creamed or fried vegetables. Vegetables in a cheese sauce. Regular canned vegetables. Regular canned tomato sauce and paste. Regular tomato and vegetable juices. Fruits Dried fruits. Canned fruit in light or heavy syrup. Fruit juice. Meat and Other Protein Products Fatty cuts of meat. Ribs, chicken wings, bacon, sausage, bologna, salami, chitterlings, fatback, hot   dogs, bratwurst, and packaged luncheon meats. Salted nuts and seeds. Canned beans with salt. Dairy Whole or 2% milk, cream, half-and-half, and cream cheese. Whole-fat or sweetened yogurt. Full-fat cheeses or blue cheese. Nondairy creamers and whipped toppings. Processed cheese, cheese spreads, or cheese curds. Condiments Onion and garlic salt, seasoned salt, table salt, and sea salt. Canned and packaged gravies. Worcestershire sauce. Tartar sauce. Barbecue sauce.  Teriyaki sauce. Soy sauce, including reduced sodium. Steak sauce. Fish sauce. Oyster sauce. Cocktail sauce. Horseradish. Ketchup and mustard. Meat flavorings and tenderizers. Bouillon cubes. Hot sauce. Tabasco sauce. Marinades. Taco seasonings. Relishes. Fats and Oils Butter, stick margarine, lard, shortening, ghee, and bacon fat. Coconut, palm kernel, or palm oils. Regular salad dressings. Other Pickles and olives. Salted popcorn and pretzels. The items listed above may not be a complete list of foods and beverages to avoid. Contact your dietitian for more information. WHERE CAN I FIND MORE INFORMATION? National Heart, Lung, and [employer] Institute: www.nhlbi.nih.gov/health/health-topics/topics/dash/ Document Released: 03/13/2011 Document Revised: 08/08/2013 Document Reviewed: 01/26/2013 ExitCare Patient Information 2015 [employer]. This information is not intended to replace advice given to you by your health care provider. Make sure you discuss any questions you have with your health care provider.  

## 2014-10-25 NOTE — Progress Notes (Signed)
Patient here for follow up from the hospital  Has several diabetic ulcers to her right foot Patient will need community resources to help with her medications

## 2014-10-26 ENCOUNTER — Other Ambulatory Visit: Payer: Self-pay

## 2014-10-26 ENCOUNTER — Telehealth: Payer: Self-pay

## 2014-10-26 ENCOUNTER — Encounter (HOSPITAL_COMMUNITY): Payer: Self-pay

## 2014-10-26 ENCOUNTER — Encounter (HOSPITAL_COMMUNITY)
Admission: RE | Admit: 2014-10-26 | Discharge: 2014-10-26 | Disposition: A | Discharge status: Home / Self Care | Payer: 59 | Source: Ambulatory Visit | Attending: Vascular Surgery | Admitting: Vascular Surgery

## 2014-10-26 HISTORY — DX: Constipation, unspecified: K59.00

## 2014-10-26 HISTORY — DX: Type 2 diabetes mellitus without complications: E11.9

## 2014-10-26 LAB — URINE MICROSCOPIC-ADD ON

## 2014-10-26 LAB — URINALYSIS, ROUTINE W REFLEX MICROSCOPIC
Glucose, UA: 100 mg/dL — AB
Hgb urine dipstick: NEGATIVE
Ketones, ur: 15 mg/dL — AB
Leukocytes, UA: NEGATIVE
Nitrite: NEGATIVE
Protein, ur: NEGATIVE mg/dL
Specific Gravity, Urine: 1.027 (ref 1.005–1.030)
Urobilinogen, UA: 1 mg/dL (ref 0.0–1.0)
pH: 5.5 (ref 5.0–8.0)

## 2014-10-26 LAB — VITAMIN D 25 HYDROXY (VIT D DEFICIENCY, FRACTURES): Vit D, 25-Hydroxy: 21 ng/mL — ABNORMAL LOW (ref 30–100)

## 2014-10-26 LAB — GLUCOSE, CAPILLARY
Glucose-Capillary: 64 mg/dL — ABNORMAL LOW (ref 65–99)
Glucose-Capillary: 75 mg/dL (ref 65–99)

## 2014-10-26 LAB — SURGICAL PCR SCREEN
MRSA, PCR: NEGATIVE
Staphylococcus aureus: NEGATIVE

## 2014-10-26 LAB — CULTURE, BLOOD (ROUTINE X 2)
Culture: NO GROWTH
Culture: NO GROWTH

## 2014-10-26 LAB — TYPE AND SCREEN
ABO/RH(D): A POS
Antibody Screen: NEGATIVE

## 2014-10-26 MED ORDER — VITAMIN D (ERGOCALCIFEROL) 1.25 MG (50000 UNIT) PO CAPS: 50000.0000 [IU] | ORAL_CAPSULE | ORAL | Status: DC

## 2014-10-26 MED ORDER — SODIUM CHLORIDE 0.9 % IV SOLN: INTRAVENOUS | Status: DC

## 2014-10-26 MED ORDER — CEFUROXIME SODIUM 1.5 G IJ SOLR
1.5000 g | INTRAMUSCULAR | Status: AC
  Administered 2014-10-27 (×2): 1.5 g via INTRAVENOUS
  Filled 2014-10-26: qty 1.5

## 2014-10-26 NOTE — Pre-Procedure Instructions (Addendum)
    Shirley Chen  10/26/2014      Your procedure is scheduled on Friday, July 22.  Report to [facility] Admitting at 5:30 A.M.  Call this number if you have problems the morning of surgery:  [phone]   Remember:  Do not eat food or drink liquids after midnight.  Take these medicines the morning of surgery with A SIP OF WATER:  carvedilol (COREG) , hydrALAZINE (APRESOLINE).    Do not wear jewelry, make-up or nail polish.  Do not wear lotions, powders, or perfumes.    Do not shave 48 hours prior to surgery.    Do not bring valuables to the hospital.  [name] is not responsible for any belongings or valuables.  Contacts, dentures or bridgework may not be worn into surgery.  Leave your suitcase in the car.  After surgery it may be brought to your room.  For patients admitted to the hospital, discharge time will be determined by your treatment team.  Special instructions:  Review  [facility] - Preparing For Surgery.  Please read over the following fact sheets that you were given. Pain Booklet, Coughing and Deep Breathing, Blood Transfusion Information and Surgical Site Infection Prevention, How to Manage Your Diabetes Before and After Surgery and What Do I Do About My Diabetes Medications?       

## 2014-10-26 NOTE — Progress Notes (Signed)
Hypoglycemic Event  CBG: 64  Treatment: 4 oz apple juice Symptoms: Sweaty ( per patient, I did not notice any diaphoresis).  Follow-up CBG: Time:1540 CBG Result:75  Possible Reasons for Event: Inadequate meal intake- has not eaten lunch- new Diabetic  Comments/MD notified: Edmonia Caprio.  Follow What Do I DO about My Diabetes Medication and How do I manage Blood sugar before surgery.    Estella Husk  Remember to initiate Hypoglycemia Order Set & complete

## 2014-10-26 NOTE — Telephone Encounter (Signed)
Patient states she most likely wont be returning to our off Due to the fact we can not help her financially Patient states she will probably be going to one of her Children's doctor in the future

## 2014-10-26 NOTE — Pre-Procedure Instructions (Signed)
    Shirley Chen  10/26/2014      Your procedure is scheduled on Friday, July 22.  Report to [facility] Admitting at 5:30 A.M.  Call this number if you have problems the morning of surgery:  [phone]   Remember:  Do not eat food or drink liquids after midnight.  Take these medicines the morning of surgery with A SIP OF WATER:  carvedilol (COREG) , hydrALAZINE (APRESOLINE).    Do not wear jewelry, make-up or nail polish.  Do not wear lotions, powders, or perfumes.    Do not shave 48 hours prior to surgery.    Do not bring valuables to the hospital.  [facility] is not responsible for any belongings or valuables.  Contacts, dentures or bridgework may not be worn into surgery.  Leave your suitcase in the car.  After surgery it may be brought to your room.  For patients admitted to the hospital, discharge time will be determined by your treatment team.  Special instructions:  Review  [name] - Preparing For Surgery.  Please read over the following fact sheets that you were given. Pain Booklet, Coughing and Deep Breathing, Blood Transfusion Information and Surgical Site Infection Prevention, How to Manage Your Diabetes Before and After Surgery and What Do I Do About My Diabetes Medications?

## 2014-10-26 NOTE — Telephone Encounter (Signed)
-----   Message from Doris Cheadle, MD sent at 10/26/2014  9:25 AM EDT ----- Blood work reviewed, noticed low vitamin D, call patient advise to start ergocalciferol 50,000 units once a week for the duration of  12 weeks, then take OTC vitamin d 2000 units daily. Also noticed  still she has elevated white blood cells, Likely secondary to recent foot  Infection, advise patient to continue and finish the course of antibiotic.

## 2014-10-27 ENCOUNTER — Inpatient Hospital Stay (HOSPITAL_COMMUNITY): Payer: 59

## 2014-10-27 ENCOUNTER — Inpatient Hospital Stay (HOSPITAL_COMMUNITY): Payer: 59 | Admitting: Vascular Surgery

## 2014-10-27 ENCOUNTER — Encounter (HOSPITAL_COMMUNITY): Payer: Self-pay | Admitting: Surgery

## 2014-10-27 ENCOUNTER — Encounter (HOSPITAL_COMMUNITY)
Admission: RE | Disposition: A | Discharge status: Home Health | Payer: Self-pay | Source: Ambulatory Visit | Attending: Vascular Surgery

## 2014-10-27 ENCOUNTER — Inpatient Hospital Stay (HOSPITAL_COMMUNITY): Payer: 59 | Admitting: Anesthesiology

## 2014-10-27 ENCOUNTER — Inpatient Hospital Stay (HOSPITAL_COMMUNITY)
Admission: RE | Admit: 2014-10-27 | Discharge: 2014-10-30 | DRG: 253 | Disposition: A | Discharge status: Home Health | Payer: 59 | Source: Ambulatory Visit | Attending: Vascular Surgery | Admitting: Vascular Surgery

## 2014-10-27 DIAGNOSIS — Z419 Encounter for procedure for purposes other than remedying health state, unspecified: Secondary | ICD-10-CM

## 2014-10-27 DIAGNOSIS — I70261 Atherosclerosis of native arteries of extremities with gangrene, right leg: Secondary | ICD-10-CM | POA: Diagnosis present

## 2014-10-27 DIAGNOSIS — I70235 Atherosclerosis of native arteries of right leg with ulceration of other part of foot: Secondary | ICD-10-CM | POA: Diagnosis not present

## 2014-10-27 DIAGNOSIS — M1990 Unspecified osteoarthritis, unspecified site: Secondary | ICD-10-CM | POA: Diagnosis present

## 2014-10-27 DIAGNOSIS — E1151 Type 2 diabetes mellitus with diabetic peripheral angiopathy without gangrene: Secondary | ICD-10-CM | POA: Diagnosis present

## 2014-10-27 DIAGNOSIS — E876 Hypokalemia: Secondary | ICD-10-CM | POA: Diagnosis present

## 2014-10-27 DIAGNOSIS — F17210 Nicotine dependence, cigarettes, uncomplicated: Secondary | ICD-10-CM | POA: Diagnosis present

## 2014-10-27 DIAGNOSIS — Z794 Long term (current) use of insulin: Secondary | ICD-10-CM | POA: Diagnosis not present

## 2014-10-27 DIAGNOSIS — Z7982 Long term (current) use of aspirin: Secondary | ICD-10-CM | POA: Diagnosis not present

## 2014-10-27 DIAGNOSIS — L97519 Non-pressure chronic ulcer of other part of right foot with unspecified severity: Secondary | ICD-10-CM | POA: Diagnosis present

## 2014-10-27 DIAGNOSIS — I7025 Atherosclerosis of native arteries of other extremities with ulceration: Secondary | ICD-10-CM | POA: Diagnosis present

## 2014-10-27 DIAGNOSIS — I739 Peripheral vascular disease, unspecified: Secondary | ICD-10-CM | POA: Diagnosis present

## 2014-10-27 HISTORY — PX: INTRAOPERATIVE ARTERIOGRAM: SHX5157

## 2014-10-27 HISTORY — PX: FEMORAL-TIBIAL BYPASS GRAFT: SHX938

## 2014-10-27 LAB — CBC
HCT: 30.5 % — ABNORMAL LOW (ref 36.0–46.0)
Hemoglobin: 10.1 g/dL — ABNORMAL LOW (ref 12.0–15.0)
MCH: 29.4 pg (ref 26.0–34.0)
MCHC: 33.1 g/dL (ref 30.0–36.0)
MCV: 88.9 fL (ref 78.0–100.0)
Platelets: 340 10*3/uL (ref 150–400)
RBC: 3.43 MIL/uL — ABNORMAL LOW (ref 3.87–5.11)
RDW: 13.5 % (ref 11.5–15.5)
WBC: 13 10*3/uL — ABNORMAL HIGH (ref 4.0–10.5)

## 2014-10-27 LAB — CREATININE, SERUM
Creatinine, Ser: 0.68 mg/dL (ref 0.44–1.00)
GFR calc Af Amer: >60 mL/min (ref >60)
GFR calc non Af Amer: >60 mL/min (ref >60)

## 2014-10-27 LAB — GLUCOSE, CAPILLARY
Glucose-Capillary: 73 mg/dL (ref 65–99)
Glucose-Capillary: 73 mg/dL (ref 65–99)
Glucose-Capillary: 72 mg/dL (ref 65–99)
Glucose-Capillary: 109 mg/dL — ABNORMAL HIGH (ref 65–99)

## 2014-10-27 SURGERY — CREATION, BYPASS, ARTERIAL, FEMORAL TO TIBIAL, USING GRAFT
Anesthesia: General | Site: Leg Lower | Laterality: Right

## 2014-10-27 MED ORDER — SODIUM CHLORIDE 0.9 % IV SOLN
INTRAVENOUS | Status: DC
  Administered 2014-10-27: 17:00:00 via INTRAVENOUS

## 2014-10-27 MED ORDER — SODIUM CHLORIDE 0.9 % IR SOLN
Status: DC | PRN
  Administered 2014-10-27: 08:00:00

## 2014-10-27 MED ORDER — PAPAVERINE HCL 30 MG/ML IJ SOLN
INTRAMUSCULAR | Status: AC
  Filled 2014-10-27: qty 2

## 2014-10-27 MED ORDER — ONDANSETRON HCL 4 MG/2ML IJ SOLN: 4.0000 mg | Freq: Four times a day (QID) | INTRAMUSCULAR | Status: DC | PRN

## 2014-10-27 MED ORDER — MAGNESIUM SULFATE 2 GM/50ML IV SOLN
2.0000 g | Freq: Every day | INTRAVENOUS | Status: DC | PRN
  Filled 2014-10-27: qty 50

## 2014-10-27 MED ORDER — VITAMIN D (ERGOCALCIFEROL) 1.25 MG (50000 UNIT) PO CAPS
50000.0000 [IU] | ORAL_CAPSULE | ORAL | Status: DC
  Filled 2014-10-27: qty 1

## 2014-10-27 MED ORDER — METOPROLOL TARTRATE 1 MG/ML IV SOLN: 2.0000 mg | INTRAVENOUS | Status: DC | PRN

## 2014-10-27 MED ORDER — ALUM & MAG HYDROXIDE-SIMETH 200-200-20 MG/5ML PO SUSP: 15.0000 mL | ORAL | Status: DC | PRN

## 2014-10-27 MED ORDER — MELATONIN 3 MG PO TABS
18.0000 mg | ORAL_TABLET | Freq: Every day | ORAL | Status: DC
  Administered 2014-10-27 – 2014-10-29 (×3): 18 mg via ORAL
  Filled 2014-10-27 (×4): qty 6

## 2014-10-27 MED ORDER — DEXTROSE 5 % IV SOLN
1.5000 g | Freq: Two times a day (BID) | INTRAVENOUS | Status: AC
  Administered 2014-10-27 – 2014-10-28 (×2): 1.5 g via INTRAVENOUS
  Filled 2014-10-27 (×2): qty 1.5

## 2014-10-27 MED ORDER — LIDOCAINE HCL (CARDIAC) 20 MG/ML IV SOLN
INTRAVENOUS | Status: DC | PRN
  Administered 2014-10-27: 70 mg via INTRAVENOUS

## 2014-10-27 MED ORDER — PHENYLEPHRINE HCL 10 MG/ML IJ SOLN
10.0000 mg | INTRAVENOUS | Status: DC | PRN
  Administered 2014-10-27: 25 ug/min via INTRAVENOUS

## 2014-10-27 MED ORDER — HYDRALAZINE HCL 20 MG/ML IJ SOLN: 5.0000 mg | INTRAMUSCULAR | Status: DC | PRN

## 2014-10-27 MED ORDER — ROCURONIUM BROMIDE 50 MG/5ML IV SOLN
INTRAVENOUS | Status: AC
  Filled 2014-10-27: qty 1

## 2014-10-27 MED ORDER — FENTANYL CITRATE (PF) 250 MCG/5ML IJ SOLN
INTRAMUSCULAR | Status: AC
  Filled 2014-10-27: qty 5

## 2014-10-27 MED ORDER — CEFUROXIME SODIUM 1.5 G IJ SOLR
1.5000 g | INTRAMUSCULAR | Status: DC
  Filled 2014-10-27: qty 1.5

## 2014-10-27 MED ORDER — CARVEDILOL 6.25 MG PO TABS
6.2500 mg | ORAL_TABLET | Freq: Two times a day (BID) | ORAL | Status: DC
  Administered 2014-10-27 – 2014-10-30 (×6): 6.25 mg via ORAL
  Filled 2014-10-27 (×8): qty 1

## 2014-10-27 MED ORDER — ATORVASTATIN CALCIUM 10 MG PO TABS
10.0000 mg | ORAL_TABLET | Freq: Every day | ORAL | Status: DC
  Administered 2014-10-27 – 2014-10-30 (×4): 10 mg via ORAL
  Filled 2014-10-27 (×4): qty 1

## 2014-10-27 MED ORDER — PANTOPRAZOLE SODIUM 40 MG PO TBEC
40.0000 mg | DELAYED_RELEASE_TABLET | Freq: Every day | ORAL | Status: DC
  Administered 2014-10-27 – 2014-10-30 (×4): 40 mg via ORAL
  Filled 2014-10-27 (×4): qty 1

## 2014-10-27 MED ORDER — 0.9 % SODIUM CHLORIDE (POUR BTL) OPTIME
TOPICAL | Status: DC | PRN
  Administered 2014-10-27: 2000 mL

## 2014-10-27 MED ORDER — HYDROMORPHONE HCL 1 MG/ML IJ SOLN
INTRAMUSCULAR | Status: AC
  Filled 2014-10-27: qty 1

## 2014-10-27 MED ORDER — LIDOCAINE HCL (CARDIAC) 20 MG/ML IV SOLN
INTRAVENOUS | Status: AC
Start: 2014-10-27 — End: 2014-10-27
  Filled 2014-10-27: qty 5

## 2014-10-27 MED ORDER — MELATONIN 10 MG PO TBDP: 20.0000 mg | ORAL_TABLET | Freq: Every day | ORAL | Status: DC

## 2014-10-27 MED ORDER — PHENYLEPHRINE HCL 10 MG/ML IJ SOLN
INTRAMUSCULAR | Status: DC | PRN
  Administered 2014-10-27 (×4): 80 ug via INTRAVENOUS

## 2014-10-27 MED ORDER — CHLORHEXIDINE GLUCONATE 4 % EX LIQD: 60.0000 mL | Freq: Once | CUTANEOUS | Status: DC

## 2014-10-27 MED ORDER — PROPOFOL 10 MG/ML IV BOLUS
INTRAVENOUS | Status: AC
  Filled 2014-10-27: qty 20

## 2014-10-27 MED ORDER — MORPHINE SULFATE 2 MG/ML IJ SOLN
2.0000 mg | INTRAMUSCULAR | Status: DC | PRN
  Administered 2014-10-28 (×2): 2 mg via INTRAVENOUS
  Filled 2014-10-27 (×2): qty 1

## 2014-10-27 MED ORDER — ONDANSETRON HCL 4 MG/2ML IJ SOLN
INTRAMUSCULAR | Status: AC
  Filled 2014-10-27: qty 2

## 2014-10-27 MED ORDER — INSULIN ASPART 100 UNIT/ML ~~LOC~~ SOLN
0.0000 [IU] | SUBCUTANEOUS | Status: DC
  Administered 2014-10-28 – 2014-10-30 (×5): 3 [IU] via SUBCUTANEOUS
  Administered 2014-10-30 (×2): 2 [IU] via SUBCUTANEOUS

## 2014-10-27 MED ORDER — ASPIRIN EC 81 MG PO TBEC
81.0000 mg | DELAYED_RELEASE_TABLET | Freq: Every day | ORAL | Status: DC
  Administered 2014-10-28 – 2014-10-30 (×3): 81 mg via ORAL
  Filled 2014-10-27 (×3): qty 1

## 2014-10-27 MED ORDER — MIDAZOLAM HCL 5 MG/5ML IJ SOLN
INTRAMUSCULAR | Status: DC | PRN
  Administered 2014-10-27 (×2): 1 mg via INTRAVENOUS

## 2014-10-27 MED ORDER — HYDRALAZINE HCL 25 MG PO TABS
25.0000 mg | ORAL_TABLET | Freq: Three times a day (TID) | ORAL | Status: DC
  Administered 2014-10-27 – 2014-10-30 (×9): 25 mg via ORAL
  Filled 2014-10-27 (×12): qty 1

## 2014-10-27 MED ORDER — PROTAMINE SULFATE 10 MG/ML IV SOLN
INTRAVENOUS | Status: DC | PRN
  Administered 2014-10-27: 40 mg via INTRAVENOUS

## 2014-10-27 MED ORDER — SODIUM CHLORIDE 0.9 % IJ SOLN
INTRAMUSCULAR | Status: AC
  Filled 2014-10-27: qty 10

## 2014-10-27 MED ORDER — HYDROMORPHONE HCL 1 MG/ML IJ SOLN
0.2500 mg | INTRAMUSCULAR | Status: DC | PRN
  Administered 2014-10-27: 0.5 mg via INTRAVENOUS

## 2014-10-27 MED ORDER — MIDAZOLAM HCL 2 MG/2ML IJ SOLN
INTRAMUSCULAR | Status: AC
  Filled 2014-10-27: qty 2

## 2014-10-27 MED ORDER — EPHEDRINE SULFATE 50 MG/ML IJ SOLN
INTRAMUSCULAR | Status: AC
  Filled 2014-10-27: qty 1

## 2014-10-27 MED ORDER — ACETAMINOPHEN 325 MG PO TABS
325.0000 mg | ORAL_TABLET | ORAL | Status: DC | PRN
Start: 2014-10-27 — End: 2014-10-30

## 2014-10-27 MED ORDER — ACETAMINOPHEN 650 MG RE SUPP: 325.0000 mg | RECTAL | Status: DC | PRN

## 2014-10-27 MED ORDER — HEPARIN SODIUM (PORCINE) 1000 UNIT/ML IJ SOLN
INTRAMUSCULAR | Status: AC
  Filled 2014-10-27: qty 1

## 2014-10-27 MED ORDER — INSULIN ASPART PROT & ASPART (70-30 MIX) 100 UNIT/ML ~~LOC~~ SUSP
25.0000 [IU] | Freq: Two times a day (BID) | SUBCUTANEOUS | Status: DC
  Administered 2014-10-28: 25 [IU] via SUBCUTANEOUS
  Filled 2014-10-27: qty 10

## 2014-10-27 MED ORDER — SODIUM CHLORIDE 0.9 % IV SOLN: 500.0000 mL | Freq: Once | INTRAVENOUS | Status: AC | PRN

## 2014-10-27 MED ORDER — LABETALOL HCL 5 MG/ML IV SOLN
10.0000 mg | INTRAVENOUS | Status: DC | PRN
  Filled 2014-10-27: qty 4

## 2014-10-27 MED ORDER — PHENYLEPHRINE 40 MCG/ML (10ML) SYRINGE FOR IV PUSH (FOR BLOOD PRESSURE SUPPORT)
PREFILLED_SYRINGE | INTRAVENOUS | Status: AC
  Filled 2014-10-27: qty 10

## 2014-10-27 MED ORDER — IOHEXOL 300 MG/ML  SOLN
INTRAMUSCULAR | Status: DC | PRN
  Administered 2014-10-27: 17 mL via INTRAVENOUS

## 2014-10-27 MED ORDER — ROCURONIUM BROMIDE 100 MG/10ML IV SOLN
INTRAVENOUS | Status: DC | PRN
  Administered 2014-10-27: 50 mg via INTRAVENOUS

## 2014-10-27 MED ORDER — LACTATED RINGERS IV SOLN
INTRAVENOUS | Status: DC | PRN
  Administered 2014-10-27 (×4): via INTRAVENOUS

## 2014-10-27 MED ORDER — HEPARIN SODIUM (PORCINE) 1000 UNIT/ML IJ SOLN
INTRAMUSCULAR | Status: DC | PRN
  Administered 2014-10-27: 7 mL via INTRAVENOUS

## 2014-10-27 MED ORDER — NICOTINE 21 MG/24HR TD PT24
21.0000 mg | MEDICATED_PATCH | Freq: Every day | TRANSDERMAL | Status: DC
  Administered 2014-10-27 – 2014-10-30 (×4): 21 mg via TRANSDERMAL
  Filled 2014-10-27 (×4): qty 1

## 2014-10-27 MED ORDER — DOCUSATE SODIUM 100 MG PO CAPS
100.0000 mg | ORAL_CAPSULE | Freq: Every day | ORAL | Status: DC
  Administered 2014-10-28 – 2014-10-30 (×3): 100 mg via ORAL
  Filled 2014-10-27 (×3): qty 1

## 2014-10-27 MED ORDER — ONDANSETRON HCL 4 MG/2ML IJ SOLN
INTRAMUSCULAR | Status: DC | PRN
  Administered 2014-10-27: 4 mg via INTRAVENOUS

## 2014-10-27 MED ORDER — PROPOFOL 10 MG/ML IV BOLUS
INTRAVENOUS | Status: DC | PRN
  Administered 2014-10-27: 150 mg via INTRAVENOUS

## 2014-10-27 MED ORDER — THROMBIN 20000 UNITS EX SOLR
CUTANEOUS | Status: AC
  Filled 2014-10-27: qty 20000

## 2014-10-27 MED ORDER — PROMETHAZINE HCL 25 MG/ML IJ SOLN: 6.2500 mg | INTRAMUSCULAR | Status: DC | PRN

## 2014-10-27 MED ORDER — OXYCODONE-ACETAMINOPHEN 5-325 MG PO TABS
1.0000 | ORAL_TABLET | ORAL | Status: DC | PRN
  Administered 2014-10-27 – 2014-10-30 (×11): 2 via ORAL
  Filled 2014-10-27 (×10): qty 2
  Filled 2014-10-27: qty 1
  Filled 2014-10-27 (×2): qty 2

## 2014-10-27 MED ORDER — ALBUMIN HUMAN 5 % IV SOLN
INTRAVENOUS | Status: DC | PRN
  Administered 2014-10-27: 08:00:00 via INTRAVENOUS

## 2014-10-27 MED ORDER — GUAIFENESIN-DM 100-10 MG/5ML PO SYRP: 15.0000 mL | ORAL_SOLUTION | ORAL | Status: DC | PRN

## 2014-10-27 MED ORDER — SULFAMETHOXAZOLE-TRIMETHOPRIM 800-160 MG PO TABS
1.0000 | ORAL_TABLET | Freq: Two times a day (BID) | ORAL | Status: DC
  Administered 2014-10-27 – 2014-10-30 (×6): 1 via ORAL
  Filled 2014-10-27 (×7): qty 1

## 2014-10-27 MED ORDER — VITAMIN D (ERGOCALCIFEROL) 1.25 MG (50000 UNIT) PO CAPS
50000.0000 [IU] | ORAL_CAPSULE | ORAL | Status: DC
  Administered 2014-10-27: 50000 [IU] via ORAL
  Filled 2014-10-27: qty 1

## 2014-10-27 MED ORDER — OXYCODONE HCL 5 MG PO TABS: 5.0000 mg | ORAL_TABLET | Freq: Four times a day (QID) | ORAL | Status: DC | PRN

## 2014-10-27 MED ORDER — ENOXAPARIN SODIUM 40 MG/0.4ML ~~LOC~~ SOLN
40.0000 mg | SUBCUTANEOUS | Status: DC
  Administered 2014-10-28 – 2014-10-30 (×3): 40 mg via SUBCUTANEOUS
  Filled 2014-10-27 (×4): qty 0.4

## 2014-10-27 MED ORDER — PHENOL 1.4 % MT LIQD: 1.0000 | OROMUCOSAL | Status: DC | PRN

## 2014-10-27 MED ORDER — POTASSIUM CHLORIDE CRYS ER 20 MEQ PO TBCR
20.0000 meq | EXTENDED_RELEASE_TABLET | Freq: Every day | ORAL | Status: DC | PRN
Start: 2014-10-27 — End: 2014-10-30

## 2014-10-27 MED ORDER — PAPAVERINE HCL 30 MG/ML IJ SOLN
INTRAMUSCULAR | Status: DC | PRN
  Administered 2014-10-27: 60 mg

## 2014-10-27 MED ORDER — FENTANYL CITRATE (PF) 100 MCG/2ML IJ SOLN
INTRAMUSCULAR | Status: DC | PRN
  Administered 2014-10-27: 100 ug via INTRAVENOUS
  Administered 2014-10-27 (×8): 50 ug via INTRAVENOUS

## 2014-10-27 SURGICAL SUPPLY — 62 items
BANDAGE ELASTIC 4 VELCRO ST LF (GAUZE/BANDAGES/DRESSINGS) IMPLANT
BANDAGE ESMARK 6X9 LF (GAUZE/BANDAGES/DRESSINGS) ×2 IMPLANT
BNDG ESMARK 6X9 LF (GAUZE/BANDAGES/DRESSINGS) ×3
CANISTER SUCTION 2500CC (MISCELLANEOUS) ×3 IMPLANT
CANNULA VESSEL 3MM 2 BLNT TIP (CANNULA) ×6 IMPLANT
CLIP TI MEDIUM 24 (CLIP) ×3 IMPLANT
CLIP TI WIDE RED SMALL 24 (CLIP) ×9 IMPLANT
COVER PROBE W GEL 5X96 (DRAPES) ×3 IMPLANT
CUFF TOURNIQUET SINGLE 18IN (TOURNIQUET CUFF) IMPLANT
CUFF TOURNIQUET SINGLE 24IN (TOURNIQUET CUFF) IMPLANT
CUFF TOURNIQUET SINGLE 34IN LL (TOURNIQUET CUFF) ×3 IMPLANT
CUFF TOURNIQUET SINGLE 44IN (TOURNIQUET CUFF) IMPLANT
DRAIN CHANNEL 15F RND FF W/TCR (WOUND CARE) IMPLANT
DRAPE PROXIMA HALF (DRAPES) IMPLANT
DRAPE X-RAY CASS 24X20 (DRAPES) IMPLANT
ELECT REM PT RETURN 9FT ADLT (ELECTROSURGICAL) ×3
ELECTRODE REM PT RTRN 9FT ADLT (ELECTROSURGICAL) ×2 IMPLANT
EVACUATOR SILICONE 100CC (DRAIN) IMPLANT
GAUZE SPONGE 4X4 16PLY XRAY LF (GAUZE/BANDAGES/DRESSINGS) IMPLANT
GLOVE BIO SURGEON STRL SZ 6 (GLOVE) ×3 IMPLANT
GLOVE BIO SURGEON STRL SZ 6.5 (GLOVE) ×6 IMPLANT
GLOVE BIO SURGEON STRL SZ7 (GLOVE) ×3 IMPLANT
GLOVE BIO SURGEON STRL SZ7.5 (GLOVE) ×3 IMPLANT
GLOVE BIOGEL PI IND STRL 6.5 (GLOVE) ×10 IMPLANT
GLOVE BIOGEL PI IND STRL 7.0 (GLOVE) ×4 IMPLANT
GLOVE BIOGEL PI IND STRL 8 (GLOVE) ×2 IMPLANT
GLOVE BIOGEL PI INDICATOR 6.5 (GLOVE) ×5
GLOVE BIOGEL PI INDICATOR 7.0 (GLOVE) ×2
GLOVE BIOGEL PI INDICATOR 8 (GLOVE) ×1
GLOVE SS N UNI LF 6.5 STRL (GLOVE) ×3 IMPLANT
GLOVE SURG SS PI 6.5 STRL IVOR (GLOVE) ×9 IMPLANT
GOWN STRL REUS W/ TWL LRG LVL3 (GOWN DISPOSABLE) ×6 IMPLANT
GOWN STRL REUS W/TWL LRG LVL3 (GOWN DISPOSABLE) ×3
KIT BASIN OR (CUSTOM PROCEDURE TRAY) ×3 IMPLANT
KIT ROOM TURNOVER OR (KITS) ×3 IMPLANT
LIQUID BAND (GAUZE/BANDAGES/DRESSINGS) ×6 IMPLANT
MARKER GRAFT CORONARY BYPASS (MISCELLANEOUS) ×3 IMPLANT
NS IRRIG 1000ML POUR BTL (IV SOLUTION) ×6 IMPLANT
PACK PERIPHERAL VASCULAR (CUSTOM PROCEDURE TRAY) ×3 IMPLANT
PAD ARMBOARD 7.5X6 YLW CONV (MISCELLANEOUS) ×6 IMPLANT
PADDING CAST COTTON 6X4 STRL (CAST SUPPLIES) ×3 IMPLANT
SET COLLECT BLD 21X3/4 12 (NEEDLE) IMPLANT
SPONGE SURGIFOAM ABS GEL 100 (HEMOSTASIS) IMPLANT
STOPCOCK 4 WAY LG BORE MALE ST (IV SETS) IMPLANT
SUT PROLENE 5 0 C 1 24 (SUTURE) ×3 IMPLANT
SUT PROLENE 6 0 BV (SUTURE) ×15 IMPLANT
SUT PROLENE 7 0 BV 1 (SUTURE) ×3 IMPLANT
SUT SILK 2 0 (SUTURE) ×1
SUT SILK 2 0 FS (SUTURE) IMPLANT
SUT SILK 2 0 SH (SUTURE) ×3 IMPLANT
SUT SILK 2-0 18XBRD TIE 12 (SUTURE) ×2 IMPLANT
SUT SILK 3 0 (SUTURE) ×4
SUT SILK 3-0 18XBRD TIE 12 (SUTURE) ×8 IMPLANT
SUT VIC AB 2-0 CTB1 (SUTURE) ×9 IMPLANT
SUT VIC AB 3-0 SH 27 (SUTURE) ×3
SUT VIC AB 3-0 SH 27X BRD (SUTURE) ×6 IMPLANT
SUT VICRYL 4-0 PS2 18IN ABS (SUTURE) ×12 IMPLANT
TAPE UMBILICAL COTTON 1/8X30 (MISCELLANEOUS) ×3 IMPLANT
TRAY FOLEY W/METER SILVER 16FR (SET/KITS/TRAYS/PACK) ×3 IMPLANT
TUBING EXTENTION W/L.L. (IV SETS) IMPLANT
UNDERPAD 30X30 INCONTINENT (UNDERPADS AND DIAPERS) ×3 IMPLANT
WATER STERILE IRR 1000ML POUR (IV SOLUTION) ×3 IMPLANT

## 2014-10-27 NOTE — Progress Notes (Signed)
   VASCULAR SURGERY POST OP CHECK:  * Doing well  * Foot hyperemic.    SUBJECTIVE: Pain well controlled.   PHYSICAL EXAM: Filed Vitals:   10/27/14 1400 10/27/14 1415 10/27/14 1421 10/27/14 1500  BP: 112/56 114/53 114/53 122/56  Pulse: 82 79 79   Temp:  98.6 F (37 C)  97.9 F (36.6 C)  TempSrc:    Oral  Resp: Height:     (1.626 m)  Weight:    192 lb 0.3 oz (87.1 kg)  SpO2: 99% 98% 96%    Brisk DP signal right foot Palpable graft pulse (lateral leg) Right foot hyperemic.   LABS: Lab Results  Component Value Date   WBC 12.6* 10/25/2014   HGB 11.0* 10/25/2014   HCT 32.2* 10/25/2014   MCV 84.7 10/25/2014   PLT 394 10/25/2014   Lab Results  Component Value Date   CREATININE 0.62 10/25/2014   Lab Results  Component Value Date   INR 1.07 10/20/2014   CBG (last 3)   Recent Labs  10/26/14 1546 10/27/14 0620 10/27/14 1322  GLUCAP 75 73 73    Active Problems:   Atherosclerosis of native arteries of the extremities with ulceration  Cari Caraway Beeper: [phone] 10/27/2014

## 2014-10-27 NOTE — Anesthesia Preprocedure Evaluation (Addendum)
Anesthesia Evaluation  Patient identified by MRN, date of birth, ID band Patient awake    Airway Mallampati: I  TM Distance: >3 FB Neck ROM: Full    Dental  (+) Edentulous Upper   Pulmonary Current Smoker,  breath sounds clear to auscultation        Cardiovascular + Peripheral Vascular Disease Rhythm:Regular Rate:Normal     Neuro/Psych  Headaches,    GI/Hepatic   Endo/Other  diabetes, Poorly Controlled  Renal/GU      Musculoskeletal  (+) Arthritis -,   Abdominal   Peds  Hematology   Anesthesia Other Findings   Reproductive/Obstetrics                            Anesthesia Physical Anesthesia Plan  ASA: III  Anesthesia Plan: General   Post-op Pain Management:    Induction: Intravenous  Airway Management Planned: Oral ETT  Additional Equipment:   Intra-op Plan:   Post-operative Plan: Extubation in OR  Informed Consent: I have reviewed the patients History and Physical, chart, labs and discussed the procedure including the risks, benefits and alternatives for the proposed anesthesia with the patient or authorized representative who has indicated his/her understanding and acceptance.   Dental advisory given  Plan Discussed with: CRNA and Surgeon  Anesthesia Plan Comments:         Anesthesia Quick Evaluation

## 2014-10-27 NOTE — H&P (View-Only) (Signed)
   VASCULAR SURGERY ASSESSMENT & PLAN:  * I have reviewed her arteriogram from yesterday. I have recommended a right femoral to anterior tibial artery bypass which I could do on Friday. I will try to get her vein mapping done this morning. If she is otherwise ready for discharge, she could be discharged today and I could bring her back Friday for surgery. I have reviewed the indications for lower extremity bypass. I have also reviewed the potential complications of surgery including but not limited to: wound healing problems, infection, graft thrombosis, limb loss, or other unpredictable medical problems. All the patient's questions were answered and they are agreeable to proceed.  SUBJECTIVE: No complaints  PHYSICAL EXAM: Filed Vitals:   10/23/14 1431 10/23/14 1556 10/23/14 2032 10/24/14 0400  BP: 163/91 143/67 134/86 151/74  Pulse: 96 92  96  Temp:  97.6 F (36.4 C) 98 F (36.7 C) 98.9 F (37.2 C)  TempSrc:  Oral Oral Oral  Resp: 10 10 15 17  Height:      Weight:    177 lb 14.4 oz (80.695 kg)  SpO2: 97% 99% 97% 97%   Wounds on right foot are unchanged.  LABS: Lab Results  Component Value Date   WBC 11.6* 10/24/2014   HGB 11.1* 10/24/2014   HCT 33.2* 10/24/2014   MCV 86.5 10/24/2014   PLT 322 10/24/2014   Lab Results  Component Value Date   CREATININE 0.56 10/24/2014   Lab Results  Component Value Date   INR 1.07 10/20/2014   CBG (last 3)   Recent Labs  10/24/14 0015 10/24/14 0414 10/24/14 0629  GLUCAP 231* 172* 145*   ARTERIOGRAM: This shows some moderate to severe superficial femoral artery occlusive disease. She also has an occluded popliteal artery with severe tibial artery occlusive disease. It appears that the only option for revascularization would be a fem to anterior tibial artery bypass.  Principal Problem:   Right foot infection Active Problems:   Sepsis   Tobacco abuse   Hyperglycemia   Vaginal itching   Elevated blood pressure  Shirley  [name] Beeper: [phone] 10/24/2014    

## 2014-10-27 NOTE — Interval H&P Note (Signed)
History and Physical Interval Note:  10/27/2014 7:19 AM  [name]  has presented today for surgery, with the diagnosis of Peripheral arterial disease   The various methods of treatment have been discussed with the patient and family. After consideration of risks, benefits and other options for treatment, the patient has consented to  Procedure(s): BYPASS GRAFT FEMORAL- ANTERIOR TIBIAL ARTERY (Right) as a surgical intervention .  The patient's history has been reviewed, patient examined, no change in status, stable for surgery.  I have reviewed the patient's chart and labs.  Questions were answered to the patient's satisfaction.     Shirley Chen

## 2014-10-27 NOTE — Progress Notes (Signed)
UR COMPLETED  

## 2014-10-27 NOTE — Anesthesia Procedure Notes (Signed)
Procedure Name: Intubation Date/Time: 10/27/2014 7:35 AM Performed by: Ferol Luz L Pre-anesthesia Checklist: Patient identified, Emergency Drugs available, Suction available, Patient being monitored and Timeout performed Patient Re-evaluated:Patient Re-evaluated prior to inductionOxygen Delivery Method: Circle system utilized Preoxygenation: Pre-oxygenation with 100% oxygen Intubation Type: IV induction Ventilation: Mask ventilation with difficulty and Oral airway inserted - appropriate to patient size Laryngoscope Size: Mac and 3 Grade View: Grade I Tube type: Oral Tube size: 7.5 mm Number of attempts: 1 Airway Equipment and Method: Stylet Placement Confirmation: ETT inserted through vocal cords under direct vision,  breath sounds checked- equal and bilateral and positive ETCO2 Secured at: 20 cm Tube secured with: Tape Dental Injury: Teeth and Oropharynx as per pre-operative assessment

## 2014-10-27 NOTE — Anesthesia Postprocedure Evaluation (Signed)
  Anesthesia Post-op Note  Patient: Shirley Chen  Procedure(s) Performed: Procedure(s): Right Femoral to Anterior Tibial Bypass using Right Greater Saphenous Vein (Right) INTRA OPERATIVE ARTERIOGRAM (Right)  Patient Location: PACU  Anesthesia Type:General  Level of Consciousness: awake and alert   Airway and Oxygen Therapy: Patient Spontanous Breathing  Post-op Pain: mild  Post-op Assessment: Post-op Vital signs reviewed and Patient's Cardiovascular Status Stable LLE Motor Response: Purposeful movement LLE Sensation: Full sensation RLE Motor Response: Purposeful movement RLE Sensation: Full sensation      Post-op Vital Signs: stable  Last Vitals:  Filed Vitals:   10/27/14 1415  BP: 114/53  Pulse: 79  Temp: 37 C  Resp: 14    Complications: No apparent anesthesia complications

## 2014-10-27 NOTE — Op Note (Signed)
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 Right common femoral artery to anterior tibial artery bypass with non-reversed translocated saphenous vein graft 2. Intraoperative arteriogram  SURGEON: Di Kindle. Edilia Bo, MD, FACS  ASSIST: Karsten Ro, [facility]  ANESTHESIA: Gen.   EBL: 100 cc  INDICATIONS: Shirley Chen is a 59 y.o. female who presented with nonhealing wounds of her right foot. She had severe superficial femoral artery and tibial artery occlusive disease. It was felt that without revascularization the wounds would not heal and femoral to anterior tibial artery bypass graft was recommended for limb salvage.  FINDINGS: The anterior tibial artery was small but free of atherosclerosis at the site of the distal anastomosis. The patient had a bifid saphenous system and the largest of the 2 veins was selected.  TECHNIQUE: The patient was taken to the operating room and received a general anesthetic. Her pannus was taped superiorly. The right groin and entire right lower extremity were prepped and draped in the usual sterile fashion. An oblique incision was made in the right groin. This was done because of her body habitus. The dissection was carried down to the common femoral artery which was controlled with a vessel loop. The deep femoral artery and superficial femoral arteries were controlled with vessel loops. The saphenofemoral junction was dissected free and the patient had a bifid saphenous system. Both veins in the groin were about the same size. It was difficult to tell which of the veins was the better conduit. I therefore used the ultrasound to try to within a final larger vein. Using 4 additional incisions along the medial aspect of the right leg the greater saphenous vein was harvested from the  groin to the mid calf. Branches were divided between clips and 3-0 silk ties.  A separate longitudinal incision was made over the anterior lateral aspect of the leg and the anterior tibial artery dissected free between the muscle bellies. The artery was small but free of disease at this level. I measured to be sure that I had adequate length of vein and then the vein was ligated distally and brought through the tunnel. It was divided proximally preserving the second saphenous system. The venotomy was closed with a running 50 proline suture. Vein was distended up with heparinized sealing and was a good quality vein.  A tunnel was increased from the anterior tibial artery to the groin and the patient was then heparinized. The common femoral, superficial femoral, and deep femoral arteries were clamped and longitudinal arteriotomy made in the distal common femoral artery. The vein was used in a non-reverse fashion. The vein was spatulated proximally in the proximal valve was sharply excised. A radiopaque ring was placed at the proximal anastomosis. The vein was sewn end to side to the common femoral artery using continuous 50 proline suture. Prior to completing the anastomosis the arteries were backbled and flushed properly and there was good inflow. Using a retrograde [place] valvulotome the valves were sharply excised. Excellent flow was established through the vein graft which was flushed with heparinized saline and clipped. It was in marked prevent twisting. It was brought to the previously created tunnel for anastomosis to the anterior tibial artery.  A tourniquet was placed on the thigh. The leg was exsanguinated with an Esmarch bandage. Detergent was inflated to 250 mmHg.  Under tourniquet control a longitudinal arteriotomy was made in the anterior tibial artery. The vein graft was cut the appropriate length, spatulated, and sewn into side to the artery using 2 continuous 60 proline suture. Prior to completing  the anastomosis the tourniquet was released. The artery was backbled and flushed appropriately and the anastomosis completed.  A completion arteriogram was obtained by cannulating the proximal vein graft. This showed no technical problems. There was some spasm distal to the anastomosis were I passed a 2.5 mm dilator. The heparin was partially reversed with protamine.This was obtained in the wounds. The groin wound was closed with 2 deep layers of 3-0 Vicryl. The subcutaneous layer was closed with 3-0 Vicryl. The skin was closed with a 4-0 subcuticular stitch. The remaining 4 incisions were closed with a deep layer of 2-0 Vicryl, subcutaneous layer 3-0 Vicryl, and the skin was closed with 40 septic stitch. Incision was a palpable graft pulse in the lateral aspect of the leg and a brisk biphasic Doppler anterior tibial signal. Patient was transferred to recovery room in stable condition. All needle and sponge counts were correct.  [name], MD, FACS [facility]  DATE OF DICTATION:   10/27/2014

## 2014-10-27 NOTE — Transfer of Care (Signed)
Immediate Anesthesia Transfer of Care Note  Patient: Shirley Chen  Procedure(s) Performed: Procedure(s): Right Femoral to Anterior Tibial Bypass using Right Greater Saphenous Vein (Right) INTRA OPERATIVE ARTERIOGRAM (Right)  Patient Location: PACU  Anesthesia Type:General  Level of Consciousness: awake, alert  and patient cooperative  Airway & Oxygen Therapy: Patient Spontanous Breathing and Patient connected to nasal cannula oxygen  Post-op Assessment: Report given to RN, Post -op Vital signs reviewed and stable and Patient moving all extremities  Post vital signs: Reviewed and stable  Last Vitals:  Filed Vitals:   10/27/14 0617  BP: 131/46  Pulse: 84  Temp: 37.1 C  Resp: 18    Complications: No apparent anesthesia complications

## 2014-10-28 LAB — CBC
HCT: 29 % — ABNORMAL LOW (ref 36.0–46.0)
Hemoglobin: 9.4 g/dL — ABNORMAL LOW (ref 12.0–15.0)
MCH: 29.3 pg (ref 26.0–34.0)
MCHC: 32.4 g/dL (ref 30.0–36.0)
MCV: 90.3 fL (ref 78.0–100.0)
Platelets: 337 10*3/uL (ref 150–400)
RBC: 3.21 MIL/uL — ABNORMAL LOW (ref 3.87–5.11)
RDW: 13.5 % (ref 11.5–15.5)
WBC: 9.1 10*3/uL (ref 4.0–10.5)

## 2014-10-28 LAB — BASIC METABOLIC PANEL
Anion gap: 7 (ref 5–15)
BUN: 9 mg/dL (ref 6–20)
CO2: 25 mmol/L (ref 22–32)
Calcium: 8 mg/dL — ABNORMAL LOW (ref 8.9–10.3)
Chloride: 106 mmol/L (ref 101–111)
Creatinine, Ser: 0.67 mg/dL (ref 0.44–1.00)
GFR calc Af Amer: >60 mL/min (ref >60)
GFR calc non Af Amer: >60 mL/min (ref >60)
Glucose, Bld: 93 mg/dL (ref 65–99)
Potassium: 3.8 mmol/L (ref 3.5–5.1)
Sodium: 138 mmol/L (ref 135–145)

## 2014-10-28 LAB — GLUCOSE, CAPILLARY
Glucose-Capillary: 110 mg/dL — ABNORMAL HIGH (ref 65–99)
Glucose-Capillary: 157 mg/dL — ABNORMAL HIGH (ref 65–99)
Glucose-Capillary: 167 mg/dL — ABNORMAL HIGH (ref 65–99)
Glucose-Capillary: 197 mg/dL — ABNORMAL HIGH (ref 65–99)
Glucose-Capillary: 83 mg/dL (ref 65–99)
Glucose-Capillary: 84 mg/dL (ref 65–99)

## 2014-10-28 NOTE — Progress Notes (Signed)
Received Diabetes Coordinator consult. CBGs running 109-110-83-84 mg/dl. Noted that patient takes 75/25 mixed insulin 25 units BID at home.  Currently on 70/30 25 units BID and Novolog MODERATE correction scale every 4 hours in the hospital. Recommend decreasing 70/30 to 20 units BID and decreasing Novolog correction scale to SENSITIVE TID & HS if eating. In determining affordability of insulin, care management can speak with the patient and look at her insurance to determine her financial needs.  Could change home insulin to [employer] Reli-on Novolin 70/30 insulin which would be $25 per bottle. Will continue to follow while in hospital. Smith Mince RN BSN CDE

## 2014-10-28 NOTE — Progress Notes (Signed)
CSW c/s noted.  Medication assistance/eligibility is determined by the [facility].  CSW signing off.  Please re-consult should any socail work needs arise. Thanks.

## 2014-10-28 NOTE — Evaluation (Signed)
Physical Therapy Evaluation Patient Details Name: Shirley Chen MRN: [id number] DOB: 1955/09/16 Today's Date: 10/28/2014   History of Present Illness  Pt is a 59 y.o. female admitted for right foot pain. She has necrotic ulcers on her R great toe and R heel s/p right fem to anterior tib BPG, sepsis  Clinical Impression  Pt up in chair on arrival, very pleasant and determined to be able to go up stairs to enter apartment. Pt limited by pain currently and educated for HEP, ROM as well as activity progression. Pt encouraged to ambulate and be OOB daily with nursing staff. Pt will benefit from acute therapy to maximize mobility, gait and function to decrease burden of care and increase independence.     Follow Up Recommendations Home health PT    Equipment Recommendations  Rolling walker with 5" wheels    Recommendations for Other Services       Precautions / Restrictions Precautions Precautions: Fall      Mobility  Bed Mobility               General bed mobility comments: pt in chair on arrival  Transfers     Transfers: Sit to/from Stand Sit to Stand: Min guard         General transfer comment: cues for hand placement, safety and sequence  Ambulation/Gait Ambulation/Gait assistance: Min guard Ambulation Distance (Feet): 20 Feet Assistive device: Rolling walker (2 wheeled) Gait Pattern/deviations: Step-to pattern;Antalgic   Gait velocity interpretation: Below normal speed for age/gender General Gait Details: cues for sequence, RW use to offload weight with bil UE. Pt initially ambulating with NWB RLE and halfway through able to begin PwB RLE limited by pain  Stairs            Wheelchair Mobility    Modified Rankin (Stroke Patients Only)       Balance Overall balance assessment: Needs assistance   Sitting balance-Leahy Scale: Good       Standing balance-Leahy Scale: Poor                               Pertinent Vitals/Pain Pain  Score: 7  Pain Location: right foot, calf and heel Pain Descriptors / Indicators: Aching;Burning Pain Intervention(s): Premedicated before session;Repositioned;Patient requesting pain meds-RN notified;Limited activity within patient's tolerance    Home Living Family/patient expects to be discharged to:: Private residence Living Arrangements: Children Available Help at Discharge: Family Type of Home: Apartment Home Access: Stairs to enter Entrance Stairs-Rails: Left Entrance Stairs-Number of Steps: 2 flights Home Layout: One level Home Equipment: Cane - single point      Prior Function Level of Independence: Independent with assistive device(s)               Hand Dominance        Extremity/Trunk Assessment   Upper Extremity Assessment: Overall WFL for tasks assessed           Lower Extremity Assessment: RLE deficits/detail RLE Deficits / Details: decreased dorsiflexion, pain limiting knee ROM and strength    Cervical / Trunk Assessment: Normal  Communication   Communication: No difficulties  Cognition Arousal/Alertness: Awake/alert Behavior During Therapy: WFL for tasks assessed/performed Overall Cognitive Status: Within Functional Limits for tasks assessed                      General Comments      Exercises General Exercises - Lower Extremity Long Arc Quad:  AROM;Seated;Right;10 reps Hip Flexion/Marching: AROM;Seated;Right;10 reps Toe Raises: AROM;Seated;Right;5 reps      Assessment/Plan    PT Assessment Patient needs continued PT services  PT Diagnosis Difficulty walking;Acute pain   PT Problem List Decreased activity tolerance;Decreased mobility;Pain;Decreased knowledge of use of DME;Decreased strength;Decreased balance  PT Treatment Interventions DME instruction;Gait training;Stair training;Functional mobility training;Therapeutic activities;Therapeutic exercise;Patient/family education;Balance training   PT Goals (Current goals can be  found in the Care Plan section) Acute Rehab PT Goals Patient Stated Goal: go home and take care of my granddaughter PT Goal Formulation: With patient Time For Goal Achievement: 11/11/14 Potential to Achieve Goals: Good    Frequency Min 3X/week   Barriers to discharge Decreased caregiver support      Co-evaluation               End of Session   Activity Tolerance: Patient limited by pain Patient left: in chair;with call bell/phone within reach Nurse Communication: Mobility status;Patient requests pain meds         Time: 1211-1230 PT Time Calculation (min) (ACUTE ONLY): 19 min   Charges:   PT Evaluation $Initial PT Evaluation Tier I: 1 Procedure     PT G CodesDelorse Lek 10/28/2014, 1:55 PM Delaney Meigs, PT [phone]

## 2014-10-28 NOTE — Progress Notes (Signed)
  Progress Note    10/28/2014 7:38 AM 1 Day Post-Op  Subjective:  States she is having blurry vision and has been for 2-3 days-seems to be getting better this morning.  States she feels like her sugar is low  Afebrile HR 70's-90's NSR 110's-130's systolic 93% 2LO2NC  Filed Vitals:   10/28/14 0502  BP: 125/57  Pulse: 91  Temp:   Resp: 23    Physical Exam: Cardiac:  regular Lungs:  Non labored Incisions:  Healing nicely Extremities:  2+ palpable DP right foot; palpable graft pulse   CBC    Component Value Date/Time   WBC 9.1 10/28/2014 0257   RBC 3.21* 10/28/2014 0257   HGB 9.4* 10/28/2014 0257   HCT 29.0* 10/28/2014 0257   PLT 337 10/28/2014 0257   MCV 90.3 10/28/2014 0257   MCH 29.3 10/28/2014 0257   MCHC 32.4 10/28/2014 0257   RDW 13.5 10/28/2014 0257   LYMPHSABS 1.4 10/25/2014 1549   MONOABS 0.8 10/25/2014 1549   EOSABS 0.4 10/25/2014 1549   BASOSABS 0.0 10/25/2014 1549    BMET    Component Value Date/Time   NA 138 10/28/2014 0257   K 3.8 10/28/2014 0257   CL 106 10/28/2014 0257   CO2 25 10/28/2014 0257   GLUCOSE 93 10/28/2014 0257   BUN 9 10/28/2014 0257   CREATININE 0.67 10/28/2014 0257   CREATININE 0.62 10/25/2014 1549   CALCIUM 8.0* 10/28/2014 0257   GFRNONAA >60 10/28/2014 0257   GFRNONAA >89 10/25/2014 1549   GFRAA >60 10/28/2014 0257   GFRAA >89 10/25/2014 1549    INR    Component Value Date/Time   INR 1.07 10/20/2014 2343     Intake/Output Summary (Last 24 hours) at 10/28/14 0738 Last data filed at 10/28/14 0324  Gross per 24 hour  Intake   3300 ml  Output   1625 ml  Net   1675 ml     Assessment:  59 y.o. female is s/p:  1. Right common femoral artery to anterior tibial artery bypass with non-reversed translocated saphenous vein graft 2. Intraoperative arteriogram  1 Day Post-Op  Plan: -doing well this morning with palpable graft pulse and palpable right DP -DVT prophylaxis:  Lovenox -pt is diabetic-insulin ordered,  but glucose has not been over the 80's per RN.  Will ask DM coordinator to help manage pt's meds -mobilize this morning -d/c foley -dry gauze to right groin to help wick moisture to help prevent infection.  Discussed importance of this with pt and she expressed understanding.   Doreatha Massed, PA-C [facility] [phone] 10/28/2014 7:38 AM    I agree with the above.  The patient has been seen and evaluated.  Her incisions are healing nicely.  She has a palpable graft pulse.  Her foot feels much better.  We'll encourage ambulation today.  She'll be transferred to the floor.  Her blurry vision is thought to be related to management of her diabetes.  The diabetes team will be asked to evaluate the patient.  Durene Cal

## 2014-10-29 LAB — GLUCOSE, CAPILLARY
Glucose-Capillary: 101 mg/dL — ABNORMAL HIGH (ref 65–99)
Glucose-Capillary: 110 mg/dL — ABNORMAL HIGH (ref 65–99)
Glucose-Capillary: 111 mg/dL — ABNORMAL HIGH (ref 65–99)
Glucose-Capillary: 162 mg/dL — ABNORMAL HIGH (ref 65–99)
Glucose-Capillary: 169 mg/dL — ABNORMAL HIGH (ref 65–99)
Glucose-Capillary: 191 mg/dL — ABNORMAL HIGH (ref 65–99)
Glucose-Capillary: 82 mg/dL (ref 65–99)

## 2014-10-29 NOTE — Progress Notes (Signed)
    Subjective  - POD #2, s/p right femoral- AT BPG with vein  Very pleased with her operation Ambulating in her room Voiding spontaneously Pain control   Physical Exam:  Incisions healing nicely Palpable graft pulse Right foot wounds are stable    Assessment/Plan:  POD #2  Diabetes: Appreciate assistance from diabetes service.  Changes noted Transfer to 2 W. We'll need to ambulate and walk up steps. Possible discharge tomorrow  Durene Cal 10/29/2014 10:26 AM --  Ceasar Mons Vitals:   10/29/14 0747  BP:   Pulse:   Temp: 98.6 F (37 C)  Resp:     Intake/Output Summary (Last 24 hours) at 10/29/14 1026 Last data filed at 10/28/14 1800  Gross per 24 hour  Intake    100 ml  Output      0 ml  Net    100 ml     Laboratory CBC    Component Value Date/Time   WBC 9.1 10/28/2014 0257   HGB 9.4* 10/28/2014 0257   HCT 29.0* 10/28/2014 0257   PLT 337 10/28/2014 0257    BMET    Component Value Date/Time   NA 138 10/28/2014 0257   K 3.8 10/28/2014 0257   CL 106 10/28/2014 0257   CO2 25 10/28/2014 0257   GLUCOSE 93 10/28/2014 0257   BUN 9 10/28/2014 0257   CREATININE 0.67 10/28/2014 0257   CREATININE 0.62 10/25/2014 1549   CALCIUM 8.0* 10/28/2014 0257   GFRNONAA >60 10/28/2014 0257   GFRNONAA >89 10/25/2014 1549   GFRAA >60 10/28/2014 0257   GFRAA >89 10/25/2014 1549    COAG Lab Results  Component Value Date   INR 1.07 10/20/2014   No results found for: PTT  Antibiotics Anti-infectives    Start     Dose/Rate Route Frequency Ordered Stop   10/27/14 2200  cefUROXime (ZINACEF) 1.5 g in dextrose 5 % 50 mL IVPB     1.5 g 100 mL/hr over 30 Minutes Intravenous Every 12 hours 10/27/14 1519 10/28/14 0953   10/27/14 1630  sulfamethoxazole-trimethoprim (BACTRIM DS,SEPTRA DS) 800-160 MG per tablet 1 tablet     1 tablet Oral 2 times daily 10/27/14 1519     10/27/14 1145  cefUROXime (ZINACEF) 1.5 g in dextrose 5 % 50 mL IVPB  Status:  Discontinued     1.5  g 100 mL/hr over 30 Minutes Intravenous To Surgery 10/27/14 1144 10/27/14 1503   10/27/14 0700  cefUROXime (ZINACEF) 1.5 g in dextrose 5 % 50 mL IVPB     1.5 g 100 mL/hr over 30 Minutes Intravenous To ShortStay Surgical 10/26/14 1401 10/27/14 1210       V. Charlena Cross, M.D. [facility] [place] Office: [phone] Pager:  [phone]

## 2014-10-29 NOTE — Progress Notes (Signed)
Patient to transfer to 2W15 report given to receiving nurse [name] all questions answered at this time.  Pt. A&O with no s/s of distress noted.  Pt. VSS.  Patient stable at transfer.

## 2014-10-29 NOTE — Progress Notes (Signed)
10/29/2014 1145 Received to room 2w15 a transfer from 3S.  Pt is A&O, no c/o voiced at this time except her right leg continues to have pain.  Tele monitor placed and CCMD notified.  Oriented to room, call light and bed.  Call bed in reach. Kathryne Hitch

## 2014-10-30 ENCOUNTER — Encounter (HOSPITAL_COMMUNITY): Payer: Self-pay | Admitting: Vascular Surgery

## 2014-10-30 ENCOUNTER — Encounter (HOSPITAL_COMMUNITY): Payer: 59

## 2014-10-30 ENCOUNTER — Telehealth: Payer: Self-pay | Admitting: Vascular Surgery

## 2014-10-30 LAB — POCT I-STAT 4, (NA,K, GLUC, HGB,HCT)
Glucose, Bld: 89 mg/dL (ref 65–99)
HCT: 25 % — ABNORMAL LOW (ref 36.0–46.0)
Hemoglobin: 8.5 g/dL — ABNORMAL LOW (ref 12.0–15.0)
Potassium: 3.7 mmol/L (ref 3.5–5.1)
Sodium: 141 mmol/L (ref 135–145)

## 2014-10-30 LAB — GLUCOSE, CAPILLARY
Glucose-Capillary: 135 mg/dL — ABNORMAL HIGH (ref 65–99)
Glucose-Capillary: 134 mg/dL — ABNORMAL HIGH (ref 65–99)

## 2014-10-30 MED ORDER — INSULIN ASPART PROT & ASPART (70-30 MIX) 100 UNIT/ML PEN: 20.0000 [IU] | PEN_INJECTOR | Freq: Two times a day (BID) | SUBCUTANEOUS | Status: DC

## 2014-10-30 MED ORDER — OXYCODONE-ACETAMINOPHEN 5-325 MG PO TABS: 1.0000 | ORAL_TABLET | ORAL | Status: DC | PRN

## 2014-10-30 NOTE — Progress Notes (Signed)
Physical Therapy Treatment Patient Details Name: Shirley Chen MRN: [id number] DOB: [birth year] Today's Date: 10/30/2014    History of Present Illness Pt is a 59 y.o. female admitted for right foot pain. She has necrotic ulcers on her R great toe and R heel s/p right fem to anterior tib BPG, sepsis    PT Comments    Pt progressing towards physical therapy goals. Was able to negotiate the stairs with occasional min assist for balance and safety. Recommended daughter have hands on support during 3 flights into apartment. Pt anticipates d/c home today. She declines HHPT but I encouraged her to discuss the possibility of outpatient PT with her MD if pt does not feel she is progressing back to baseline of function.   Follow Up Recommendations  Home health PT     Equipment Recommendations  Rolling walker with 5" wheels    Recommendations for Other Services       Precautions / Restrictions Precautions Precautions: Fall Restrictions Weight Bearing Restrictions: No Other Position/Activity Restrictions: WBAT    Mobility  Bed Mobility               General bed mobility comments: Pt received walking in room to bathroom  Transfers Overall transfer level: Modified independent Equipment used: Straight cane Transfers: Sit to/from Stand Sit to Stand: Modified independent (Device/Increase time)         General transfer comment: Pt demonstrates safe hand placement and good general safety awareness  Ambulation/Gait Ambulation/Gait assistance: Min guard Ambulation Distance (Feet): 250 Feet Assistive device: Straight cane Gait Pattern/deviations: Step-through pattern;Decreased stride length Gait velocity: decreased Gait velocity interpretation: Below normal speed for age/gender General Gait Details: VC's for sequencing with the SPC in the L hand   Stairs Stairs: Yes Stairs assistance: Min assist   Number of Stairs: 8 General stair comments: Pt required cueing for  sequencing and occasional min assist on stairs for balance. Recommended daughter have hands-on with pt while going into house as she has 3 flights of stairs to climb when she arrives home.   Wheelchair Mobility    Modified Rankin (Stroke Patients Only)       Balance Overall balance assessment: Needs assistance Sitting-balance support: Feet supported;No upper extremity supported Sitting balance-Leahy Scale: Good     Standing balance support: Single extremity supported;During functional activity Standing balance-Leahy Scale: Poor Standing balance comment: Requires UE support to maintain balance.                     Cognition Arousal/Alertness: Awake/alert Behavior During Therapy: WFL for tasks assessed/performed Overall Cognitive Status: Within Functional Limits for tasks assessed                      Exercises      General Comments General comments (skin integrity, edema, etc.): Verbally reviewed HEP. Pt declined practice as she was fatigued from the stairs      Pertinent Vitals/Pain Pain Assessment: No/denies pain    Home Living                      Prior Function            PT Goals (current goals can now be found in the care plan section) Acute Rehab PT Goals Patient Stated Goal: go home today PT Goal Formulation: With patient Time For Goal Achievement: 11/11/14 Potential to Achieve Goals: Good Progress towards PT goals: Progressing toward goals    Frequency  Min 3X/week  PT Plan Current plan remains appropriate    Co-evaluation             End of Session Equipment Utilized During Treatment: Gait belt Activity Tolerance: Patient limited by pain Patient left: in chair;with call bell/phone within reach     Time: 0949-1005 PT Time Calculation (min) (ACUTE ONLY): 16 min  Charges:  $Gait Training: 8-22 mins                    G Codes:      Conni Slipper November 20, 2014, 2:05 PM   Conni Slipper, PT, DPT Acute  Rehabilitation Services Pager: [phone]

## 2014-10-30 NOTE — Telephone Encounter (Signed)
-----   Message from Phillips Odor, RN sent at 10/27/2014  4:33 PM EDT ----- Regarding: needs 2 wk. f/u with [facility]   ----- Message -----    From: Raymond Gurney, PA-C    Sent: 10/27/2014  12:15 PM      To: [facility] Charge Pool  S/p right fem-anterior tib bypass 10/27/14  F/u w/ Dr. Edilia Bo in 2 weeks  Thanks Selena Batten

## 2014-10-30 NOTE — Progress Notes (Signed)
Nursing note Patient given discharge instructions medication list and paper prescriptions, AVS, verbalized understanding of medications, and follow up appointments. Will discharge home as ordered. Xane Amsden, Randall An RN

## 2014-10-30 NOTE — Progress Notes (Signed)
[facility] [place]  Subjective  - Ready to go home.    Objective 130/61 81 98.4 F (36.9 C) (Oral) 18 97%  Intake/Output Summary (Last 24 hours) at 10/30/14 0726 Last data filed at 10/29/14 1300  Gross per 24 hour  Intake    280 ml  Output      0 ml  Net    280 ml    Incisions healing well Foot black eschar/dry gangrene on great toe and lateral foot No open wounds  Palpable graft pulse   Assessment/Planning: POD # 3 right femoral- AT BPG with vein  Discharge home disposition stable F/U in 2 weeks with Dr. Bobetta Lime, [facility] 10/30/2014 7:26 AM --  Laboratory Lab Results:  Recent Labs  10/27/14 1635 10/28/14 0257  WBC 13.0* 9.1  HGB 10.1* 9.4*  HCT 30.5* 29.0*  PLT 340 337   BMET  Recent Labs  10/27/14 1635 10/28/14 0257  NA  --  138  K  --  3.8  CL  --  106  CO2  --  25  GLUCOSE  --  93  BUN  --  9  CREATININE 0.68 0.67  CALCIUM  --  8.0*    COAG Lab Results  Component Value Date   INR 1.07 10/20/2014   No results found for: PTT

## 2014-10-30 NOTE — Evaluation (Signed)
Occupational Therapy Evaluation and Discharge Patient Details Name: Shirley Chen MRN: [id number] DOB: 1955/05/19 Today's Date: 10/30/2014    History of Present Illness Pt is a 59 y.o. female admitted for right foot pain. She has necrotic ulcers on her R great toe and R heel s/p right fem to anterior tib BPG, sepsis   Clinical Impression   This 59 yo female admitted and underwent above presents to acute OT with all education completed and pt without further questions about BADLs, we will sign off.    Follow Up Recommendations  No OT follow up    Equipment Recommendations  None recommended by OT       Precautions / Restrictions Precautions Precautions: Fall Restrictions Weight Bearing Restrictions: No Other Position/Activity Restrictions: WBAT      Mobility Bed Mobility Overal bed mobility: Independent                Transfers Overall transfer level: Modified independent Equipment used: Straight cane Transfers: Sit to/from Stand Sit to Stand: Modified independent (Device/Increase time)                   ADL Overall ADL's : Modified independent (increased time)                                       General ADL Comments: I educated her on stepping into tub with good leg and out with operated leg once she has been given clearance by MD to get let wet (no submerging only showers initially once cleared by MD)     Vision Additional Comments: No change from baseline          Pertinent Vitals/Pain Pain Assessment: 0-10 Pain Score: 2  Pain Location: right foot and calf Pain Descriptors / Indicators: Aching Pain Intervention(s): Premedicated before session     Hand Dominance Right   Extremity/Trunk Assessment Upper Extremity Assessment Upper Extremity Assessment: Overall WFL for tasks assessed           Communication Communication Communication: No difficulties   Cognition Arousal/Alertness: Awake/alert Behavior During  Therapy: WFL for tasks assessed/performed Overall Cognitive Status: Within Functional Limits for tasks assessed                                Home Living Family/patient expects to be discharged to:: Private residence Living Arrangements: Children Available Help at Discharge: Family;Available 24 hours/day Type of Home: Apartment Home Access: Stairs to enter Entrance Stairs-Number of Steps: 3 flights Entrance Stairs-Rails: Left Home Layout: One level     Bathroom Shower/Tub: Tub/shower unit;Door Shower/tub characteristics: Door Firefighter: Standard     Home Equipment: Cane - single point          Prior Functioning/Environment Level of Independence: Independent with assistive device(s)             OT Diagnosis: Generalized weakness;Acute pain         OT Goals(Current goals can be found in the care plan section) Acute Rehab OT Goals Patient Stated Goal: go home today  OT Frequency:                End of Session Equipment Utilized During Treatment:  [facility])  Activity Tolerance: Patient tolerated treatment well Patient left: in chair;with call bell/phone within reach   Time: 0810-0823 OT Time Calculation (min): 13 min Charges:  OT General Charges $OT Visit: 1 Procedure OT Evaluation $Initial OT Evaluation Tier I: 1 Procedure  Evette Georges [phone] 10/30/2014, 8:38 AM

## 2014-10-30 NOTE — Telephone Encounter (Signed)
LM for pt re appt, dpm °

## 2014-10-30 NOTE — Care Management Note (Addendum)
Case Management Note  Patient Details  Name: Shirley Chen MRN: [id number] Date of Birth: 01-Jun-1955  Subjective/Objective:   Pt admitted with right foot infection.  Pt is newly diagnosed diabetic                  Action/Plan:  Pt is independent from home with son.  Pt does have insurance coverage, however; pt does not have PCP.  CM will assist pt with intial appointment at [facility].  CM will be unable to provide medication assitance via match as requested as pt has active insurance coverage.  CM will however submit benefit check to determine copay for non generic medications listed on AVR.   Expected Discharge Date:                  Expected Discharge Plan:     In-House Referral:     Discharge planning Services     Post Acute Care Choice:    Choice offered to:     DME Arranged:    DME Agency:     HH Arranged:    HH Agency:     Status of Service:  Complete, will sign off  Medicare Important Message Given:  No Date Medicare IM Given:    Medicare IM give by:    Date Additional Medicare IM Given:    Additional Medicare Important Message give by:     Disposition Plan:  Home with Self Care  If discussed at Long Length of Stay Meetings, dates discussed:    Additional Comments: 10/30/14 Raynald Blend, RN, BSN [phone] On previous admit; CM reviewed with pt the [employer] Card to be used in conjunction with prescription. Pt stated that she does have the 30 day supply of 75/25 provided with the  free sample voucher at last discharge.   Pt did go to appointment with [facility], per pt; per pt; she had decided to go to son's PCP and will not return back to clinic, pt does not know the doctors name at this time.  Pt has active insurance, no other medication assistance available at this time due to pt having active insurance.  PT again refused HHPT and RW as recommended.  No other CM needs at this time.   10/24/14 Raynald Blend, RN, BSN [phone] CM consulted for  medication assistance, request specific for newly prescribed Diabetic mediations.  Pt has active insurance, therefore subsidy assistance can not be provided.  CM contacted [facility] and was able to arrange PCP appointment for tomorrow 10/25/14 at 3pm.  CM faxed insurance cards to Admitting.  CM contacted DM Coordinator, Coordinator asked MD to change long acting insulin to either NPH or 75/25 that can be covered with free sample voucher card where pts copay will be $0 for one month.    Coordinator also asked CM to relay Reli On  Meter information to pt (Less expensive [employer] Brand).   CM spoke with both DM Coordinator and MD, verified pt will go home on Humalog 75/25 as the only insulin coverage  (pt will not go home on Novolog nor Lantus as previously ordered).  CM provided pt and son; brochure of [facility] with appt date 7/20 at 2:45pm in addition to online picture print out of glucometer listed above.  CM reviewed with pt the [employer] Card to be used in conjunction with prescription, card is good for one month, CM informed pt and son that PCP at [facility] could assist pt in finding an affordable  alternative if monthly copay would cause hardship, CM instructed pt to speak with PCP tomorrow about this to eliminate possibility of gap in insulin coverage post 30 free day supply.  CM reviewed all other medications listed on AVR, some medications were on the [employer] generic copay program, pt plans to go to [facility] to retrieve all medications and supplies.  CM practiced teach back method with pt regarding discharge plan, bedside nurse present.  Pt refused HH RN and RW, pt asked if she could refuse, CM informed pt it was her choice, and that the RN would help her manage her newly diagnosed DM, pt stated "I refuse, I don't want it".  No other CM needs  Cherylann Parr, RN 10/30/2014, 10:07 AM

## 2014-11-02 NOTE — Discharge Summary (Signed)
[facility] Discharge Summary   Patient ID:  Shirley Chen MRN: [id number] DOB/AGE: 1956-02-18 59 y.o.  Admit date: 10/27/2014 Discharge date: 10/30/2014 Date of Surgery: 10/27/2014 Surgeon: Surgeon(s): Angelia Mould, MD  Admission Diagnosis: Peripheral arterial disease   Discharge Diagnoses:  Peripheral arterial disease   Secondary Diagnoses: Past Medical History  Diagnosis Date  . Tobacco abuse   . Cellulitis of right foot 10/2013  . Headache     "maybe weekly" (10/20/2014)  . Diabetes mellitus without complication     Type II  . Constipation   . Arthritis     "joints in hands and right leg ache" (10/20/2014)    Procedure(s): Right Femoral to Anterior Tibial Bypass using Right Greater Saphenous Vein INTRA OPERATIVE ARTERIOGRAM  Discharged Condition: stable  HPI: Shirley Chen is a 59 y.o. female who was admitted yesterday with right foot pain. She has been having pain in the right foot for approximately a month and the pain has gradually worsened. She was noted to have wounds on her right great toe and the lateral aspect of her foot in addition to wounds adjacent to the heel. In the emergency department she was noted to have a white blood cell count of 16.9 thousand. X-ray of the extremity did not show any obvious evidence of osteomyelitis. She was admitted with a diabetic foot infection. Her blood sugar was 391. The patient was started on IV vancomycin and Zosyn. Given that she did not have palpable pulses vascular surgery was consult for further recommendations.  She does describe right calf claudication. Her symptoms are brought on by ambulation relieved with rest. She denies any significant symptoms in the left leg. She does have rest pain in the right foot.  She underwent angiography study by Dr. Bridgett Larsson 10/23/2014.  After reviewing the arterial study which showed Infrainguinal arterial occlusive disease and nonhealing ulcers of the right foot.   Dr.  Scot Dock performed a Right common femoral artery to anterior tibial artery bypass with non-reversed translocated saphenous vein graft on 10/27/2014.     Hospital Course:  [name] is a 59 y.o. female is S/P Procedure(s): Right Femoral to Anterior Tibial Bypass using Right Greater Saphenous Vein INTRA OPERATIVE ARTERIOGRAM POD#1 she was in stable condition with 2+ palpable DP right foot; palpable graft pulse.  pt is diabetic-insulin ordered, but glucose has not been over the 80's per RN. Will ask DM coordinator to help manage pt's meds POD#2 needs more mobility and stair training for safety. POD# 3 stable disposition discharge home.  Home health PT ordered and rolling walker.   Significant Diagnostic Studies: CBC Lab Results  Component Value Date   WBC 9.1 10/28/2014   HGB 9.4* 10/28/2014   HCT 29.0* 10/28/2014   MCV 90.3 10/28/2014   PLT 337 10/28/2014    BMET    Component Value Date/Time   NA 138 10/28/2014 0257   K 3.8 10/28/2014 0257   CL 106 10/28/2014 0257   CO2 25 10/28/2014 0257   GLUCOSE 93 10/28/2014 0257   BUN 9 10/28/2014 0257   CREATININE 0.67 10/28/2014 0257   CREATININE 0.62 10/25/2014 1549   CALCIUM 8.0* 10/28/2014 0257   GFRNONAA >60 10/28/2014 0257   GFRNONAA >89 10/25/2014 1549   GFRAA >60 10/28/2014 0257   GFRAA >89 10/25/2014 1549   COAG Lab Results  Component Value Date   INR 1.07 10/20/2014     Disposition:  Discharge to :Home Discharge Instructions    Call MD for:  redness, tenderness, or signs of infection (pain, swelling, bleeding, redness, odor or green/yellow discharge around incision site)    Complete by:  As directed      Call MD for:  severe or increased pain, loss or decreased feeling  in affected limb(s)    Complete by:  As directed      Call MD for:  temperature >100.5    Complete by:  As directed      Discharge patient    Complete by:  As directed   Discharge pt to home     Discharge wound care:    Complete by:  As  directed   Wash the groin wound with soap and water daily and pat dry. (No tub bath-only shower)  Then put a dry gauze or washcloth there to keep this area dry daily and as needed.  Do not use Vaseline or neosporin on your incisions.  Only use soap and water on your incisions and then protect and keep dry.  Wash remaining wounds with soap and water and pat dry.     Driving Restrictions    Complete by:  As directed   No driving for 2 weeks     Increase activity slowly    Complete by:  As directed   Walk with assistance use walker or cane as needed     Lifting restrictions    Complete by:  As directed   No lifting for 2 weeks     Resume previous diet    Complete by:  As directed             Medication List    TAKE these medications        aspirin EC 81 MG tablet  Take 1 tablet (81 mg total) by mouth daily.     atorvastatin 10 MG tablet  Commonly known as:  LIPITOR  Take 1 tablet (10 mg total) by mouth daily.     carvedilol 6.25 MG tablet  Commonly known as:  COREG  Take 1 tablet (6.25 mg total) by mouth 2 (two) times daily with a meal.     glucose monitoring kit monitoring kit  1 each by Does not apply route 4 (four) times daily - after meals and at bedtime. 1 month Diabetic Testing Supplies for QAC-QHS accuchecks. Any brand OK. Diagnosis E11.65     hydrALAZINE 25 MG tablet  Commonly known as:  APRESOLINE  Take 1 tablet (25 mg total) by mouth every 8 (eight) hours.     insulin aspart protamine - aspart (70-30) 100 UNIT/ML FlexPen  Commonly known as:  NOVOLOG MIX 70/30 FLEXPEN  Inject 0.2 mLs (20 Units total) into the skin 2 (two) times daily with a meal.     insulin lispro protamine-lispro (75-25) 100 UNIT/ML Susp injection  Commonly known as:  HUMALOG 75/25 MIX  Inject 25 Units into the skin 2 (two) times daily with a meal. Dx E11.65, can switch to any approved brand     Insulin Syringe-Needle U-100 25G X 1" 1 ML Misc  For 4 times a day insulin SQ, 1 month supply.  Diagnosis E11.65     Melatonin 10 MG Tbdp  Take 20 mg by mouth at bedtime.     nicotine 21 mg/24hr patch  Commonly known as:  NICODERM CQ - dosed in mg/24 hours  Place 1 patch (21 mg total) onto the skin daily.     oxyCODONE 5 MG immediate release tablet  Commonly known as:  ROXICODONE  Take 1 tablet (5 mg total) by mouth every 6 (six) hours as needed for severe pain.     oxyCODONE-acetaminophen 5-325 MG per tablet  Commonly known as:  PERCOCET/ROXICET  Take 1-2 tablets by mouth every 4 (four) hours as needed for moderate pain.     sulfamethoxazole-trimethoprim 800-160 MG per tablet  Commonly known as:  BACTRIM DS,SEPTRA DS  Take 1 tablet by mouth 2 (two) times daily.     Vitamin D (Ergocalciferol) 50000 UNITS Caps capsule  Commonly known as:  DRISDOL  Take 1 capsule (50,000 Units total) by mouth every 7 (seven) days.       Verbal and written Discharge instructions given to the patient. Wound care per Discharge AVS     Follow-up Information    Follow up with Deitra Mayo, MD In 2 weeks.   Specialties:  Vascular Surgery, Cardiology   Why:  Our office will call you to arrange an appointment (sent)   Contact information:   [place] [address] [phone]       Signed: Laurence Slate [facility] 11/02/2014, 1:47 PM

## 2014-11-21 ENCOUNTER — Telehealth: Payer: Self-pay

## 2014-11-21 MED ORDER — ATORVASTATIN CALCIUM 10 MG PO TABS: 10.0000 mg | ORAL_TABLET | Freq: Every day | ORAL | Status: DC

## 2014-11-21 MED ORDER — HYDRALAZINE HCL 25 MG PO TABS: 25.0000 mg | ORAL_TABLET | Freq: Three times a day (TID) | ORAL | Status: DC

## 2014-11-21 MED ORDER — CARVEDILOL 6.25 MG PO TABS: 6.2500 mg | ORAL_TABLET | Freq: Two times a day (BID) | ORAL | Status: DC

## 2014-11-21 NOTE — Telephone Encounter (Signed)
Patient called requesting  A refill on her blood pressure medication and cholesterol medication Prescription sent to [facility] on [address]

## 2014-11-22 ENCOUNTER — Encounter: Payer: 59 | Admitting: Vascular Surgery

## 2014-11-26 ENCOUNTER — Inpatient Hospital Stay (HOSPITAL_COMMUNITY)
Admission: EM | Admit: 2014-11-26 | Discharge: 2014-12-03 | DRG: 240 | Disposition: A | Discharge status: Home / Self Care | Payer: 59 | Attending: Internal Medicine | Admitting: Internal Medicine

## 2014-11-26 ENCOUNTER — Emergency Department (HOSPITAL_COMMUNITY): Payer: 59

## 2014-11-26 ENCOUNTER — Encounter (HOSPITAL_COMMUNITY): Payer: Self-pay | Admitting: *Deleted

## 2014-11-26 DIAGNOSIS — L089 Local infection of the skin and subcutaneous tissue, unspecified: Secondary | ICD-10-CM | POA: Diagnosis present

## 2014-11-26 DIAGNOSIS — Z7982 Long term (current) use of aspirin: Secondary | ICD-10-CM | POA: Diagnosis not present

## 2014-11-26 DIAGNOSIS — E1165 Type 2 diabetes mellitus with hyperglycemia: Secondary | ICD-10-CM | POA: Diagnosis present

## 2014-11-26 DIAGNOSIS — E1169 Type 2 diabetes mellitus with other specified complication: Secondary | ICD-10-CM | POA: Diagnosis not present

## 2014-11-26 DIAGNOSIS — I96 Gangrene, not elsewhere classified: Secondary | ICD-10-CM | POA: Diagnosis not present

## 2014-11-26 DIAGNOSIS — E876 Hypokalemia: Secondary | ICD-10-CM | POA: Clinically undetermined

## 2014-11-26 DIAGNOSIS — E11621 Type 2 diabetes mellitus with foot ulcer: Secondary | ICD-10-CM | POA: Diagnosis present

## 2014-11-26 DIAGNOSIS — I70261 Atherosclerosis of native arteries of extremities with gangrene, right leg: Secondary | ICD-10-CM | POA: Diagnosis present

## 2014-11-26 DIAGNOSIS — M869 Osteomyelitis, unspecified: Secondary | ICD-10-CM | POA: Diagnosis present

## 2014-11-26 DIAGNOSIS — M009 Pyogenic arthritis, unspecified: Secondary | ICD-10-CM | POA: Diagnosis present

## 2014-11-26 DIAGNOSIS — Z794 Long term (current) use of insulin: Secondary | ICD-10-CM

## 2014-11-26 DIAGNOSIS — F17200 Nicotine dependence, unspecified, uncomplicated: Secondary | ICD-10-CM | POA: Diagnosis present

## 2014-11-26 DIAGNOSIS — I739 Peripheral vascular disease, unspecified: Secondary | ICD-10-CM | POA: Diagnosis present

## 2014-11-26 DIAGNOSIS — L03115 Cellulitis of right lower limb: Secondary | ICD-10-CM | POA: Diagnosis present

## 2014-11-26 DIAGNOSIS — I7025 Atherosclerosis of native arteries of other extremities with ulceration: Secondary | ICD-10-CM | POA: Diagnosis present

## 2014-11-26 DIAGNOSIS — L97519 Non-pressure chronic ulcer of other part of right foot with unspecified severity: Secondary | ICD-10-CM | POA: Diagnosis present

## 2014-11-26 DIAGNOSIS — E785 Hyperlipidemia, unspecified: Secondary | ICD-10-CM | POA: Diagnosis present

## 2014-11-26 DIAGNOSIS — E1151 Type 2 diabetes mellitus with diabetic peripheral angiopathy without gangrene: Secondary | ICD-10-CM | POA: Diagnosis not present

## 2014-11-26 DIAGNOSIS — I152 Hypertension secondary to endocrine disorders: Secondary | ICD-10-CM | POA: Diagnosis present

## 2014-11-26 DIAGNOSIS — F17210 Nicotine dependence, cigarettes, uncomplicated: Secondary | ICD-10-CM | POA: Diagnosis present

## 2014-11-26 DIAGNOSIS — E1152 Type 2 diabetes mellitus with diabetic peripheral angiopathy with gangrene: Principal | ICD-10-CM | POA: Diagnosis present

## 2014-11-26 DIAGNOSIS — M86179 Other acute osteomyelitis, unspecified ankle and foot: Secondary | ICD-10-CM | POA: Diagnosis present

## 2014-11-26 DIAGNOSIS — I10 Essential (primary) hypertension: Secondary | ICD-10-CM | POA: Diagnosis present

## 2014-11-26 DIAGNOSIS — E119 Type 2 diabetes mellitus without complications: Secondary | ICD-10-CM | POA: Diagnosis present

## 2014-11-26 DIAGNOSIS — M86171 Other acute osteomyelitis, right ankle and foot: Secondary | ICD-10-CM | POA: Diagnosis present

## 2014-11-26 DIAGNOSIS — Z720 Tobacco use: Secondary | ICD-10-CM | POA: Diagnosis present

## 2014-11-26 DIAGNOSIS — N179 Acute kidney failure, unspecified: Secondary | ICD-10-CM | POA: Diagnosis not present

## 2014-11-26 DIAGNOSIS — M79671 Pain in right foot: Secondary | ICD-10-CM | POA: Diagnosis present

## 2014-11-26 DIAGNOSIS — N1832 Chronic kidney disease, stage 3b: Secondary | ICD-10-CM | POA: Diagnosis present

## 2014-11-26 DIAGNOSIS — E1122 Type 2 diabetes mellitus with diabetic chronic kidney disease: Secondary | ICD-10-CM | POA: Diagnosis present

## 2014-11-26 DIAGNOSIS — E1159 Type 2 diabetes mellitus with other circulatory complications: Secondary | ICD-10-CM | POA: Diagnosis present

## 2014-11-26 DIAGNOSIS — E11628 Type 2 diabetes mellitus with other skin complications: Secondary | ICD-10-CM | POA: Diagnosis present

## 2014-11-26 LAB — CBC WITH DIFFERENTIAL/PLATELET
Basophils Absolute: 0 10*3/uL (ref 0.0–0.1)
Basophils Relative: 0 % (ref 0–1)
Eosinophils Absolute: 0.4 10*3/uL (ref 0.0–0.7)
Eosinophils Relative: 5 % (ref 0–5)
HCT: 33.8 % — ABNORMAL LOW (ref 36.0–46.0)
Hemoglobin: 11.3 g/dL — ABNORMAL LOW (ref 12.0–15.0)
Lymphocytes Relative: 23 % (ref 12–46)
Lymphs Abs: 2 10*3/uL (ref 0.7–4.0)
MCH: 29.6 pg (ref 26.0–34.0)
MCHC: 33.4 g/dL (ref 30.0–36.0)
MCV: 88.5 fL (ref 78.0–100.0)
Monocytes Absolute: 0.4 10*3/uL (ref 0.1–1.0)
Monocytes Relative: 5 % (ref 3–12)
Neutro Abs: 5.8 10*3/uL (ref 1.7–7.7)
Neutrophils Relative %: 67 % (ref 43–77)
Platelets: 328 10*3/uL (ref 150–400)
RBC: 3.82 MIL/uL — ABNORMAL LOW (ref 3.87–5.11)
RDW: 13.7 % (ref 11.5–15.5)
WBC: 8.7 10*3/uL (ref 4.0–10.5)

## 2014-11-26 LAB — BASIC METABOLIC PANEL
Anion gap: 9 (ref 5–15)
BUN: 10 mg/dL (ref 6–20)
CO2: 28 mmol/L (ref 22–32)
Calcium: 8.9 mg/dL (ref 8.9–10.3)
Chloride: 103 mmol/L (ref 101–111)
Creatinine, Ser: 0.57 mg/dL (ref 0.44–1.00)
GFR calc Af Amer: >60 mL/min (ref >60)
GFR calc non Af Amer: >60 mL/min (ref >60)
Glucose, Bld: 231 mg/dL — ABNORMAL HIGH (ref 65–99)
Potassium: 3.1 mmol/L — ABNORMAL LOW (ref 3.5–5.1)
Sodium: 140 mmol/L (ref 135–145)

## 2014-11-26 LAB — GLUCOSE, CAPILLARY
Glucose-Capillary: 143 mg/dL — ABNORMAL HIGH (ref 65–99)
Glucose-Capillary: 233 mg/dL — ABNORMAL HIGH (ref 65–99)

## 2014-11-26 MED ORDER — INSULIN ASPART 100 UNIT/ML ~~LOC~~ SOLN
0.0000 [IU] | SUBCUTANEOUS | Status: DC
  Administered 2014-11-27 (×2): 3 [IU] via SUBCUTANEOUS
  Administered 2014-11-27: 2 [IU] via SUBCUTANEOUS
  Administered 2014-11-27: 3 [IU] via SUBCUTANEOUS
  Administered 2014-11-27: 5 [IU] via SUBCUTANEOUS
  Administered 2014-11-28 – 2014-11-29 (×4): 3 [IU] via SUBCUTANEOUS
  Administered 2014-11-29: 2 [IU] via SUBCUTANEOUS
  Administered 2014-11-29: 3 [IU] via SUBCUTANEOUS
  Administered 2014-11-30 (×4): 2 [IU] via SUBCUTANEOUS
  Administered 2014-11-30: 3 [IU] via SUBCUTANEOUS
  Administered 2014-12-01 – 2014-12-02 (×3): 2 [IU] via SUBCUTANEOUS
  Administered 2014-12-02: 3 [IU] via SUBCUTANEOUS
  Administered 2014-12-02 – 2014-12-03 (×6): 2 [IU] via SUBCUTANEOUS

## 2014-11-26 MED ORDER — ATORVASTATIN CALCIUM 10 MG PO TABS
10.0000 mg | ORAL_TABLET | Freq: Every day | ORAL | Status: DC
  Administered 2014-11-27 – 2014-12-03 (×7): 10 mg via ORAL
  Filled 2014-11-26 (×7): qty 1

## 2014-11-26 MED ORDER — CARVEDILOL 6.25 MG PO TABS
6.2500 mg | ORAL_TABLET | Freq: Two times a day (BID) | ORAL | Status: DC
  Administered 2014-11-26 – 2014-11-28 (×4): 6.25 mg via ORAL
  Filled 2014-11-26 (×4): qty 1

## 2014-11-26 MED ORDER — ONDANSETRON HCL 4 MG/2ML IJ SOLN
4.0000 mg | Freq: Four times a day (QID) | INTRAMUSCULAR | Status: DC | PRN
  Administered 2014-11-27 – 2014-12-01 (×4): 4 mg via INTRAVENOUS
  Filled 2014-11-26 (×4): qty 2

## 2014-11-26 MED ORDER — HYDRALAZINE HCL 25 MG PO TABS
25.0000 mg | ORAL_TABLET | Freq: Three times a day (TID) | ORAL | Status: DC
  Administered 2014-11-26 – 2014-12-02 (×19): 25 mg via ORAL
  Filled 2014-11-26 (×19): qty 1

## 2014-11-26 MED ORDER — HYDROCODONE-ACETAMINOPHEN 5-325 MG PO TABS
2.0000 | ORAL_TABLET | Freq: Once | ORAL | Status: AC
  Administered 2014-11-26: 2 via ORAL
  Filled 2014-11-26: qty 2

## 2014-11-26 MED ORDER — SODIUM CHLORIDE 0.9 % IV SOLN
INTRAVENOUS | Status: DC
  Administered 2014-11-26: 19:00:00 via INTRAVENOUS

## 2014-11-26 MED ORDER — ENOXAPARIN SODIUM 40 MG/0.4ML ~~LOC~~ SOLN
40.0000 mg | SUBCUTANEOUS | Status: DC
  Administered 2014-11-27 – 2014-12-02 (×6): 40 mg via SUBCUTANEOUS
  Filled 2014-11-26 (×7): qty 0.4

## 2014-11-26 MED ORDER — OXYCODONE HCL 5 MG PO TABS
5.0000 mg | ORAL_TABLET | Freq: Four times a day (QID) | ORAL | Status: DC | PRN
  Administered 2014-11-26 – 2014-11-29 (×8): 5 mg via ORAL
  Filled 2014-11-26 (×8): qty 1

## 2014-11-26 MED ORDER — ONDANSETRON HCL 4 MG PO TABS
4.0000 mg | ORAL_TABLET | Freq: Four times a day (QID) | ORAL | Status: DC | PRN
  Administered 2014-12-02: 4 mg via ORAL
  Filled 2014-11-26: qty 1

## 2014-11-26 MED ORDER — ACETAMINOPHEN 650 MG RE SUPP: 650.0000 mg | Freq: Four times a day (QID) | RECTAL | Status: DC | PRN

## 2014-11-26 MED ORDER — ACETAMINOPHEN 325 MG PO TABS: 650.0000 mg | ORAL_TABLET | Freq: Four times a day (QID) | ORAL | Status: DC | PRN

## 2014-11-26 MED ORDER — MORPHINE SULFATE (PF) 2 MG/ML IV SOLN
2.0000 mg | INTRAVENOUS | Status: DC | PRN
  Administered 2014-11-28: 2 mg via INTRAVENOUS
  Filled 2014-11-26: qty 1

## 2014-11-26 MED ORDER — NICOTINE 21 MG/24HR TD PT24
21.0000 mg | MEDICATED_PATCH | Freq: Every day | TRANSDERMAL | Status: DC
  Administered 2014-11-26 – 2014-12-03 (×8): 21 mg via TRANSDERMAL
  Filled 2014-11-26 (×8): qty 1

## 2014-11-26 MED ORDER — PIPERACILLIN-TAZOBACTAM 3.375 G IVPB
3.3750 g | Freq: Three times a day (TID) | INTRAVENOUS | Status: DC
Start: 2014-11-26 — End: 2014-11-29
  Administered 2014-11-26 – 2014-11-29 (×9): 3.375 g via INTRAVENOUS
  Filled 2014-11-26 (×12): qty 50

## 2014-11-26 MED ORDER — VANCOMYCIN HCL 10 G IV SOLR
1250.0000 mg | Freq: Two times a day (BID) | INTRAVENOUS | Status: DC
  Administered 2014-11-26 – 2014-11-27 (×2): 1250 mg via INTRAVENOUS
  Filled 2014-11-26 (×4): qty 1250

## 2014-11-26 MED ORDER — ALUM & MAG HYDROXIDE-SIMETH 200-200-20 MG/5ML PO SUSP
30.0000 mL | Freq: Four times a day (QID) | ORAL | Status: DC | PRN
  Administered 2014-11-28: 30 mL via ORAL
  Filled 2014-11-26: qty 30

## 2014-11-26 NOTE — Progress Notes (Signed)
ANTIBIOTIC CONSULT NOTE - INITIAL  Pharmacy Consult for Vancomycin, Zosyn Indication: great toe osteomyelitis  No Known Allergies  Patient Measurements: 87 kg  Vital Signs: Temp: 97.5 F (36.4 C) (08/21 1031) Temp Source: Oral (08/21 1031) BP: 142/94 mmHg (08/21 1500) Pulse Rate: 72 (08/21 1500)  Labs:  Recent Labs  11/26/14 1128  WBC 8.7  HGB 11.3*  PLT 328  CREATININE 0.57   CrCl cannot be calculated (Unknown ideal weight.). No results for input(s): VANCOTROUGH, VANCOPEAK, VANCORANDOM, GENTTROUGH, GENTPEAK, GENTRANDOM, TOBRATROUGH, TOBRAPEAK, TOBRARND, AMIKACINPEAK, AMIKACINTROU, AMIKACIN in the last 72 hours.   Microbiology: No results found for this or any previous visit (from the past 720 hour(s)).  Medical History: Past Medical History  Diagnosis Date  . Tobacco abuse   . Cellulitis of right foot 10/2013  . Headache     "maybe weekly" (10/20/2014)  . Diabetes mellitus without complication     Type II  . Constipation   . Arthritis     "joints in hands and right leg ache" (10/20/2014)    Assessment: 59 year old female with right foot osteomyelitis, cellulitis with plans for toe amputation scheduled for Tuesday if cellulitis improves.  Pharmacy asked to begin vancomycin and Zosyn.  Goal of Therapy:  Vancomycin trough level 10-15 mcg/ml  Appropriate Zosyn dosing  Plan:  Vancomycin 1250 mg iv Q 12 hours Zosyn 3.375 grams iv Q 8 hours - 4 hr infusion Follow up renal function, cultures, progress  Thank you. Okey Regal, PharmD [phone]  11/26/2014,4:22 PM

## 2014-11-26 NOTE — ED Notes (Signed)
Pt is alert, calm and pleasant and has no s/s of distress. VS and orders assessed and will continue to monitor and tx pt according to MD orders.

## 2014-11-26 NOTE — ED Notes (Signed)
Patient returned from X-ray 
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
## 2014-11-26 NOTE — ED Notes (Signed)
Family at bedside.(Daughter, Son in Chiropractor)

## 2014-11-26 NOTE — ED Notes (Signed)
Patient transported to X-ray 

## 2014-11-26 NOTE — Consult Note (Signed)
[facility] [place]  Patient name: Shirley Chen MRN: [id number] DOB: 1956/01/20 Sex: female  REASON FOR CONSULT: Osteomyelitis of the right great toe  HPI: Shirley Chen is a 59 y.o. female who presented with nonhealing wounds of her right foot. She had a wound on her right heel and a second wound on the medial aspect of her right heel. In addition she had an extensive wound involving the plantar aspect of her right great toe and a smaller wound on the lateral aspect of her foot. She had severe superficial femoral artery and tibial occlusive disease and her only option for revascularization was a femoral to anterior tibial artery bypass. She underwent a right common femoral artery to anterior tibial artery bypass with a vein graft. Of note the vein was bifid and the largest of the 2 veins was selected. Her surgery was on 10/27/2014.  She was scheduled to be seen in follow up later this week but returns to the emergency department with cellulitis of the right foot and drainage from the right great toe. Vascular surgery is consult it. Of note her diabetes is poorly controlled. She is trying to quit tobacco.  He describes significant pain in the right great toe. She denies fever or chills.  Past Medical History  Diagnosis Date  . Tobacco abuse   . Cellulitis of right foot 10/2013  . Headache     "maybe weekly" (10/20/2014)  . Diabetes mellitus without complication     Type II  . Constipation   . Arthritis     "joints in hands and right leg ache" (10/20/2014)    Family History  Problem Relation Age of Onset  . Diabetes Mother   . Lung cancer Brother     SOCIAL HISTORY: Social History  Substance Use Topics  . Smoking status: Current Every Day Smoker -- 1.00 packs/day for 49 years    Types: Cigarettes  . Smokeless tobacco: Never Used     Comment: 10/27/14- smoke one today, is stopping  . Alcohol Use: No    No Known Allergies  No current  facility-administered medications for this encounter.   Current Outpatient Prescriptions  Medication Sig Dispense Refill  . aspirin EC 81 MG tablet Take 1 tablet (81 mg total) by mouth daily. 30 tablet 0  . atorvastatin (LIPITOR) 10 MG tablet Take 1 tablet (10 mg total) by mouth daily. 30 tablet 0  . carvedilol (COREG) 6.25 MG tablet Take 1 tablet (6.25 mg total) by mouth 2 (two) times daily with a meal. 60 tablet 0  . glucose monitoring kit (FREESTYLE) monitoring kit 1 each by Does not apply route 4 (four) times daily - after meals and at bedtime. 1 month Diabetic Testing Supplies for QAC-QHS accuchecks. Any brand OK. Diagnosis E11.65 1 each 1  . hydrALAZINE (APRESOLINE) 25 MG tablet Take 1 tablet (25 mg total) by mouth every 8 (eight) hours. 90 tablet 0  . insulin aspart protamine - aspart (NOVOLOG MIX 70/30 FLEXPEN) (70-30) 100 UNIT/ML FlexPen Inject 0.2 mLs (20 Units total) into the skin 2 (two) times daily with a meal. 15 mL 4  . insulin lispro protamine-lispro (HUMALOG 75/25 MIX) (75-25) 100 UNIT/ML SUSP injection Inject 25 Units into the skin 2 (two) times daily with a meal. Dx E11.65, can switch to any approved brand 10 mL 11  . Insulin Syringe-Needle U-100 25G X 1" 1 ML MISC For 4 times a day insulin SQ, 1 month supply. Diagnosis E11.65 30 each 0  .  Melatonin 10 MG TBDP Take 20 mg by mouth at bedtime.    . nicotine (NICODERM CQ - DOSED IN MG/24 HOURS) 21 mg/24hr patch Place 1 patch (21 mg total) onto the skin daily. 28 patch 0  . oxyCODONE (ROXICODONE) 5 MG immediate release tablet Take 1 tablet (5 mg total) by mouth every 6 (six) hours as needed for severe pain. 30 tablet 0  . oxyCODONE-acetaminophen (PERCOCET/ROXICET) 5-325 MG per tablet Take 1-2 tablets by mouth every 4 (four) hours as needed for moderate pain. 30 tablet 0  . sulfamethoxazole-trimethoprim (BACTRIM DS,SEPTRA DS) 800-160 MG per tablet Take 1 tablet by mouth 2 (two) times daily. 12 tablet 0  . Vitamin D, Ergocalciferol,  (DRISDOL) 50000 UNITS CAPS capsule Take 1 capsule (50,000 Units total) by mouth every 7 (seven) days. 12 capsule 0   REVIEW OF SYSTEMS: Valu.Nieves ] denotes positive finding; [  ] denotes negative finding CARDIOVASCULAR:  '[ ]'  chest pain   '[ ]'  chest pressure   '[ ]'  palpitations   '[ ]'  orthopnea   '[ ]'  dyspnea on exertion   '[ ]'  claudication   '[ ]'  rest pain   '[ ]'  DVT   '[ ]'  phlebitis PULMONARY:   '[ ]'  productive cough   '[ ]'  asthma   '[ ]'  wheezing NEUROLOGIC:   '[ ]'  weakness  '[ ]'  paresthesias  '[ ]'  aphasia  '[ ]'  amaurosis  '[ ]'  dizziness HEMATOLOGIC:   '[ ]'  bleeding problems   '[ ]'  clotting disorders MUSCULOSKELETAL:  '[ ]'  joint pain   '[ ]'  joint swelling '[ ]'  leg swelling GASTROINTESTINAL: '[ ]'   blood in stool  '[ ]'   hematemesis GENITOURINARY:  '[ ]'   dysuria  '[ ]'   hematuria PSYCHIATRIC:  '[ ]'  history of major depression INTEGUMENTARY:  '[ ]'  rashes  Valu.Nieves ] ulcers CONSTITUTIONAL:  '[ ]'  fever   '[ ]'  chills  PHYSICAL EXAM: Filed Vitals:   11/26/14 1031 11/26/14 1130 11/26/14 1210 11/26/14 1230  BP: 176/50 162/65 172/65 160/61  Pulse: 78 69 71 70  Temp: 97.5 F (36.4 C)     TempSrc: Oral     Resp: '23 22 20 20  ' SpO2: 97% 99% 98% 97%   There is no weight on file to calculate BMI. GENERAL: The patient is a well-nourished female, in no acute distress. The vital signs are documented above. CARDIAC: There is a regular rate and rhythm.  VASCULAR: I do not detect carotid bruits. On the right side, she has a palpable femoral pulse, a palpable graft pulse along the lateral aspect of her right leg, and a palpable dorsalis pedis pulse. On the left side, she has a palpable femoral pulse. I cannot palpate a popliteal or pedal pulses on the left. PULMONARY: There is good air exchange bilaterally without wheezing or rales. ABDOMEN: Soft and non-tender with normal pitched bowel sounds.  MUSCULOSKELETAL: There are no major deformities. NEUROLOGIC: No focal weakness or paresthesias are detected. SKIN: She has a small dry wound over the  lateral aspect of her fifth metatarsal with no drainage. She has 2 small heel ulcers which are dry. She has extensive cellulitis of the dorsum of the foot and right great toe with moderate drainage. PSYCHIATRIC: The patient has a normal affect.  DATA:  Lab Results  Component Value Date   WBC 8.7 11/26/2014   HGB 11.3* 11/26/2014   HCT 33.8* 11/26/2014   MCV 88.5 11/26/2014   PLT 328 11/26/2014   Lab Results  Component Value  Date   NA 140 11/26/2014   K 3.1* 11/26/2014   CL 103 11/26/2014   CO2 28 11/26/2014   Lab Results  Component Value Date   CREATININE 0.57 11/26/2014   Lab Results  Component Value Date   INR 1.07 10/20/2014   Lab Results  Component Value Date   HGBA1C 12.9* 10/21/2014   X-RAY RIGHT GREAT TOE: there is osteomyelitis involving the head of the proximal phalanx of the great toe and the base of the distal phalanx with an IP joint infection suspected. I do not see evidence of osteomyelitis over the fifth lateral metatarsal where there is a wound on the foot.  MEDICAL ISSUES:  OSTEOMYELITIS RIGHT GREAT TOE: Her bypass graft on the right is patent and she has a palpable dorsalis pedis pulse. The wound on her right foot has progressed with extensive cellulitis. She is being admitted for intravenous antibiotics and control of her blood sugar. Once the cellulitis is under better control I have recommended ray amputation of the right great toe. I will tentatively try to get this on the schedule for Tuesday if the cellulitis improves. If I need to operate sooner because the infection is not improving then I  would have to leave the wound open.  Deitra Mayo [facility] [place]: [phone]

## 2014-11-27 DIAGNOSIS — E119 Type 2 diabetes mellitus without complications: Secondary | ICD-10-CM | POA: Diagnosis present

## 2014-11-27 DIAGNOSIS — N1832 Chronic kidney disease, stage 3b: Secondary | ICD-10-CM | POA: Diagnosis present

## 2014-11-27 DIAGNOSIS — M86179 Other acute osteomyelitis, unspecified ankle and foot: Secondary | ICD-10-CM | POA: Diagnosis present

## 2014-11-27 LAB — BASIC METABOLIC PANEL
Anion gap: 6 (ref 5–15)
BUN: 11 mg/dL (ref 6–20)
CO2: 29 mmol/L (ref 22–32)
Calcium: 8.3 mg/dL — ABNORMAL LOW (ref 8.9–10.3)
Chloride: 105 mmol/L (ref 101–111)
Creatinine, Ser: 0.62 mg/dL (ref 0.44–1.00)
GFR calc Af Amer: >60 mL/min (ref >60)
GFR calc non Af Amer: >60 mL/min (ref >60)
Glucose, Bld: 171 mg/dL — ABNORMAL HIGH (ref 65–99)
Potassium: 2.8 mmol/L — ABNORMAL LOW (ref 3.5–5.1)
Sodium: 140 mmol/L (ref 135–145)

## 2014-11-27 LAB — CBC
HCT: 30.7 % — ABNORMAL LOW (ref 36.0–46.0)
Hemoglobin: 9.8 g/dL — ABNORMAL LOW (ref 12.0–15.0)
MCH: 28.4 pg (ref 26.0–34.0)
MCHC: 31.9 g/dL (ref 30.0–36.0)
MCV: 89 fL (ref 78.0–100.0)
Platelets: 294 10*3/uL (ref 150–400)
RBC: 3.45 MIL/uL — ABNORMAL LOW (ref 3.87–5.11)
RDW: 13.9 % (ref 11.5–15.5)
WBC: 10.3 10*3/uL (ref 4.0–10.5)

## 2014-11-27 LAB — GLUCOSE, CAPILLARY
Glucose-Capillary: 157 mg/dL — ABNORMAL HIGH (ref 65–99)
Glucose-Capillary: 169 mg/dL — ABNORMAL HIGH (ref 65–99)
Glucose-Capillary: 189 mg/dL — ABNORMAL HIGH (ref 65–99)
Glucose-Capillary: 135 mg/dL — ABNORMAL HIGH (ref 65–99)

## 2014-11-27 LAB — C-REACTIVE PROTEIN: CRP: 4.6 mg/dL — ABNORMAL HIGH (ref <1.0)

## 2014-11-27 LAB — SEDIMENTATION RATE: Sed Rate: 68 mm/hr — ABNORMAL HIGH (ref 0–22)

## 2014-11-27 LAB — MAGNESIUM: Magnesium: 1.7 mg/dL (ref 1.7–2.4)

## 2014-11-27 MED ORDER — INSULIN GLARGINE 100 UNIT/ML ~~LOC~~ SOLN
5.0000 [IU] | Freq: Every day | SUBCUTANEOUS | Status: DC
  Administered 2014-11-28 – 2014-12-02 (×5): 5 [IU] via SUBCUTANEOUS
  Filled 2014-11-27 (×7): qty 0.05

## 2014-11-27 MED ORDER — LORAZEPAM 2 MG/ML IJ SOLN
1.0000 mg | Freq: Once | INTRAMUSCULAR | Status: AC
  Administered 2014-11-28: 1 mg via INTRAVENOUS
  Filled 2014-11-27 (×2): qty 1

## 2014-11-27 MED ORDER — POTASSIUM CHLORIDE CRYS ER 20 MEQ PO TBCR
40.0000 meq | EXTENDED_RELEASE_TABLET | Freq: Once | ORAL | Status: AC
  Administered 2014-11-27: 40 meq via ORAL
  Filled 2014-11-27: qty 2

## 2014-11-27 MED ORDER — POTASSIUM CHLORIDE 10 MEQ/100ML IV SOLN
10.0000 meq | INTRAVENOUS | Status: AC
  Administered 2014-11-27 (×3): 10 meq via INTRAVENOUS
  Filled 2014-11-27 (×3): qty 100

## 2014-11-27 NOTE — Progress Notes (Signed)
   VASCULAR SURGERY ASSESSMENT & PLAN:  * Cellulitis improved slightly.  *  I will proceed with Ray amputation of the right great toe tomorrow. I will likely need to leave this open.  * Continue intravenous and probiotic.  SUBJECTIVE: Pain better controlled.  PHYSICAL EXAM: Filed Vitals:   11/26/14 1500 11/26/14 1704 11/26/14 2134 11/27/14 0648  BP: 142/94 179/65 148/78 179/77  Pulse: 72 77 64 81  Temp:  98.6 F (37 C) 98.4 F (36.9 C) 98.2 F (36.8 C)  TempSrc:  Oral Oral Oral  Resp: SpO2: 94% 97% 94% 91%   Cellulitis right foot slightly improved.  Moderate drainage.  LABS: Lab Results  Component Value Date   WBC 10.3 11/27/2014   HGB 9.8* 11/27/2014   HCT 30.7* 11/27/2014   MCV 89.0 11/27/2014   PLT 294 11/27/2014   Lab Results  Component Value Date   CREATININE 0.62 11/27/2014   Lab Results  Component Value Date   INR 1.07 10/20/2014   CBG (last 3)   Recent Labs  11/26/14 2136 11/27/14 0647 11/27/14 1119  GLUCAP 233* 157* 169*    Principal Problem:   Necrotic toes Active Problems:   Tobacco abuse   Atherosclerosis of native arteries of the extremities with ulceration   PAD (peripheral artery disease)   Osteomyelitis   HTN (hypertension)   Diabetes mellitus with peripheral vascular disease   Cari Caraway Beeper: [phone] 11/27/2014

## 2014-11-27 NOTE — Progress Notes (Addendum)
PROGRESS NOTE  Shirley Chen YDX:[id number] DOB: 1955-09-08 DOA: 11/26/2014 PCP: [facility]  Brief history 59 year old female with history of diabetes mellitus, peripheral vascular disease, continued tobacco use, and hypertension presented with new wounds on her right hallux and lateral hee as well as purulent drainage on the plantar aspect of her toe. l. The patient was recently discharged from the hospital on 10/30/2014 after right femoral to anterior tibial bypass using the right greater saphenous vein. During that admission, the patient was noted to have a diabetic foot infection and was started on intravenous vancomycin and Zosyn.  After angiography was performed on 10/23/2014, that showed Infrainguinal arterial occlusive disease. She was subsequently taken to the operating room on 10/27/2014 where Dr. Scot Dock performed a Right common femoral artery to anterior tibial artery bypass with non-reversed translocated saphenous vein graft.  MRI of her foot on 10/21/2058 revealed soft tissue edema without enhancement, abscess, or frank osteomyelitis, but did show some marrow edema on the right first distal phalanx. The patient was ultimately discharged home with trimethoprim sulfamethoxazole. Assessment/Plan: Acute osteomyelitis of the right foot/diabetic foot infection in the setting of peripheral vascular disease -Continue vancomycin and Zosyn -Appreciate vascular surgery consultation and follow-up -Plans noted for first ray amputation on 11/28/14 -ESR and CRP today -repeat MRI foot r/o underlying abscess and clarify extent of infection -please obtain intraoperative cultures Peripheral vascular disease -10/27/2014--Dr. Scot Dock performed Right common femoral artery to anterior tibial artery bypass with non-reversed translocated saphenous vein graft Diabetes mellitus type 2, poorly controlled -10/21/2014 hemoglobin A1c 12.9 -pt was only taking 75/25 once daily at  home -she would like to switch to once daily insulin -start Lantus 5 units at hs which will likely need to be titrated up -NovoLog sliding scale Hypertension -Continue carvedilol and hydralazine Hyperlipidemia -Continue Lipitor Tobacco abuse -40-pack-year history -pt has quit Hypokalemia -Replete -Check magnesium   Family Communication:   Pt at beside Disposition Plan:   Home      Procedures/Studies: Dg Toe Great Right  11/26/2014   CLINICAL DATA:  Diabetic with infection of the right great toe.  EXAM: RIGHT GREAT TOE - 3 VIEWS  COMPARISON:  Right foot x-ray 10/20/2014.  FINDINGS: Severe soft tissue swelling involving the great toe. Loss of the cortical margin of the proximal aspect of the distal phalanx, new since the examination 5 weeks ago. Bone fragments arising from the head of the proximal phalanx, also new. No evidence of pathologic fracture. Mild narrowing of the IP joint space.  IMPRESSION: Osteomyelitis involving the head of the proximal phalanx and the base of the distal phalanx with IP joint infection also suspected.   Electronically Signed   By: Evangeline Dakin M.D.   On: 11/26/2014 12:37         Subjective: Patient denies fevers, chills, headache, chest pain, dyspnea, nausea, vomiting, diarrhea, abdominal pain, dysuria, hematuria. She states that the foot pain is controlled with oxycodone. Denies any hematochezia, melena.   Objective: Filed Vitals:   11/26/14 2134 11/27/14 0648 11/27/14 1235 11/27/14 1300  BP: 148/78 179/77 189/90 176/67  Pulse: 64 81  77  Temp: 98.4 F (36.9 C) 98.2 F (36.8 C)  98.6 F (37 C)  TempSrc: Oral Oral  Oral  Resp: '18 16  18  ' SpO2: 94% 91%  93%    Intake/Output Summary (Last 24 hours) at 11/27/14 1416 Last data filed at 11/27/14 1300  Gross per 24 hour  Intake  1700 ml  Output      1 ml  Net   1699 ml   Weight change:  Exam:   General:  Pt is alert, follows commands appropriately, not in acute distress  HEENT:  No icterus, No thrush, No neck mass, [place]/AT  Cardiovascular: RRR, S1/S2, no rubs, no gallops  Respiratory: CTA bilaterally, no wheezing, no crackles, no rhonchi  Abdomen: Soft/+BS, non tender, non distended, no guarding; right hallux with necrotic drainage and foul odor. Erythema extending to the forefoot from the hallux.  Extremities: No edema, No lymphangitis, No petechiae, No rashes, no synovitis; right dorsalis pedis pulse palpable.  Data Reviewed: Basic Metabolic Panel:  Recent Labs Lab 11/26/14 1128 11/27/14 0430  NA 140 140  K 3.1* 2.8*  CL 103 105  CO2 28 29  GLUCOSE 231* 171*  BUN 10 11  CREATININE 0.57 0.62  CALCIUM 8.9 8.3*   Liver Function Tests: No results for input(s): AST, ALT, ALKPHOS, BILITOT, PROT, ALBUMIN in the last 168 hours. No results for input(s): LIPASE, AMYLASE in the last 168 hours. No results for input(s): AMMONIA in the last 168 hours. CBC:  Recent Labs Lab 11/26/14 1128 11/27/14 0430  WBC 8.7 10.3  NEUTROABS 5.8  --   HGB 11.3* 9.8*  HCT 33.8* 30.7*  MCV 88.5 89.0  PLT 328 294   Cardiac Enzymes: No results for input(s): CKTOTAL, CKMB, CKMBINDEX, TROPONINI in the last 168 hours. BNP: Invalid input(s): POCBNP CBG:  Recent Labs Lab 11/26/14 1605 11/26/14 2136 11/27/14 0647 11/27/14 1119  GLUCAP 143* 233* 157* 169*    Recent Results (from the past 240 hour(s))  Wound culture     Status: None (Preliminary result)   Collection Time: 11/26/14 11:11 AM  Result Value Ref Range Status   Specimen Description WOUND RIGHT FOOT  Final   Special Requests Immunocompromised  Final   Gram Stain   Final    RARE WBC PRESENT, PREDOMINANTLY PMN NO SQUAMOUS EPITHELIAL CELLS SEEN MODERATE GRAM POSITIVE COCCI IN PAIRS Performed at [facility]    Culture   Final    Culture reincubated for better growth Performed at [facility]    Report Status PENDING  Incomplete     Scheduled Meds: . atorvastatin  10 mg Oral Daily  .  carvedilol  6.25 mg Oral BID WC  . enoxaparin (LOVENOX) injection  40 mg Subcutaneous Q24H  . hydrALAZINE  25 mg Oral 3 times per day  . insulin aspart  0-15 Units Subcutaneous 6 times per day  . nicotine  21 mg Transdermal Daily  . piperacillin-tazobactam (ZOSYN)  IV  3.375 g Intravenous Q8H  . vancomycin  1,250 mg Intravenous Q12H   Continuous Infusions: . sodium chloride 100 mL/hr at 11/27/14 0400     Gaynel Schaafsma, DO  [facility] Pager [phone]  If 7PM-7AM, please contact night-coverage [email or web address] Password [email or web address] 11/27/2014, 2:16 PM   LOS: 1 day

## 2014-11-27 NOTE — Progress Notes (Signed)
Utilization review completed.  

## 2014-11-28 ENCOUNTER — Encounter (HOSPITAL_COMMUNITY)
Admission: EM | Disposition: A | Discharge status: Home / Self Care | Payer: Self-pay | Source: Home / Self Care | Attending: Internal Medicine

## 2014-11-28 ENCOUNTER — Inpatient Hospital Stay (HOSPITAL_COMMUNITY): Payer: 59 | Admitting: Anesthesiology

## 2014-11-28 ENCOUNTER — Inpatient Hospital Stay (HOSPITAL_COMMUNITY): Payer: 59

## 2014-11-28 ENCOUNTER — Encounter (HOSPITAL_COMMUNITY): Payer: Self-pay | Admitting: *Deleted

## 2014-11-28 ENCOUNTER — Encounter: Payer: Self-pay | Admitting: Vascular Surgery

## 2014-11-28 DIAGNOSIS — M86171 Other acute osteomyelitis, right ankle and foot: Secondary | ICD-10-CM

## 2014-11-28 DIAGNOSIS — E11628 Type 2 diabetes mellitus with other skin complications: Secondary | ICD-10-CM | POA: Diagnosis present

## 2014-11-28 DIAGNOSIS — L089 Local infection of the skin and subcutaneous tissue, unspecified: Secondary | ICD-10-CM

## 2014-11-28 HISTORY — PX: AMPUTATION: SHX166

## 2014-11-28 LAB — CBC
HCT: 30.2 % — ABNORMAL LOW (ref 36.0–46.0)
Hemoglobin: 9.8 g/dL — ABNORMAL LOW (ref 12.0–15.0)
MCH: 28.9 pg (ref 26.0–34.0)
MCHC: 32.5 g/dL (ref 30.0–36.0)
MCV: 89.1 fL (ref 78.0–100.0)
Platelets: 285 10*3/uL (ref 150–400)
RBC: 3.39 MIL/uL — ABNORMAL LOW (ref 3.87–5.11)
RDW: 14 % (ref 11.5–15.5)
WBC: 10.5 10*3/uL (ref 4.0–10.5)

## 2014-11-28 LAB — BASIC METABOLIC PANEL
Anion gap: 7 (ref 5–15)
Anion gap: 8 (ref 5–15)
BUN: 11 mg/dL (ref 6–20)
BUN: 12 mg/dL (ref 6–20)
CO2: 24 mmol/L (ref 22–32)
CO2: 28 mmol/L (ref 22–32)
Calcium: 8.5 mg/dL — ABNORMAL LOW (ref 8.9–10.3)
Calcium: 8.8 mg/dL — ABNORMAL LOW (ref 8.9–10.3)
Chloride: 111 mmol/L (ref 101–111)
Chloride: 106 mmol/L (ref 101–111)
Creatinine, Ser: 1.43 mg/dL — ABNORMAL HIGH (ref 0.44–1.00)
Creatinine, Ser: 1.6 mg/dL — ABNORMAL HIGH (ref 0.44–1.00)
GFR calc Af Amer: 46 mL/min — ABNORMAL LOW (ref >60)
GFR calc Af Amer: 40 mL/min — ABNORMAL LOW (ref >60)
GFR calc non Af Amer: 40 mL/min — ABNORMAL LOW (ref >60)
GFR calc non Af Amer: 34 mL/min — ABNORMAL LOW (ref >60)
Glucose, Bld: 129 mg/dL — ABNORMAL HIGH (ref 65–99)
Glucose, Bld: 122 mg/dL — ABNORMAL HIGH (ref 65–99)
Potassium: 3.8 mmol/L (ref 3.5–5.1)
Potassium: 3.9 mmol/L (ref 3.5–5.1)
Sodium: 142 mmol/L (ref 135–145)
Sodium: 142 mmol/L (ref 135–145)

## 2014-11-28 LAB — GLUCOSE, CAPILLARY
Glucose-Capillary: 113 mg/dL — ABNORMAL HIGH (ref 65–99)
Glucose-Capillary: 113 mg/dL — ABNORMAL HIGH (ref 65–99)
Glucose-Capillary: 120 mg/dL — ABNORMAL HIGH (ref 65–99)
Glucose-Capillary: 121 mg/dL — ABNORMAL HIGH (ref 65–99)
Glucose-Capillary: 121 mg/dL — ABNORMAL HIGH (ref 65–99)
Glucose-Capillary: 131 mg/dL — ABNORMAL HIGH (ref 65–99)
Glucose-Capillary: 159 mg/dL — ABNORMAL HIGH (ref 65–99)

## 2014-11-28 LAB — VANCOMYCIN, RANDOM: Vancomycin Rm: 10 ug/mL

## 2014-11-28 LAB — SURGICAL PCR SCREEN
MRSA, PCR: NEGATIVE
Staphylococcus aureus: NEGATIVE

## 2014-11-28 SURGERY — AMPUTATION DIGIT
Anesthesia: General | Site: Foot | Laterality: Right

## 2014-11-28 MED ORDER — BACITRACIN ZINC 500 UNIT/GM EX OINT
TOPICAL_OINTMENT | CUTANEOUS | Status: AC
  Filled 2014-11-28: qty 28.35

## 2014-11-28 MED ORDER — 0.9 % SODIUM CHLORIDE (POUR BTL) OPTIME
TOPICAL | Status: DC | PRN
  Administered 2014-11-28: 1000 mL

## 2014-11-28 MED ORDER — ONDANSETRON HCL 4 MG/2ML IJ SOLN: 4.0000 mg | Freq: Once | INTRAMUSCULAR | Status: DC | PRN

## 2014-11-28 MED ORDER — MIDAZOLAM HCL 2 MG/2ML IJ SOLN
INTRAMUSCULAR | Status: AC
  Filled 2014-11-28: qty 4

## 2014-11-28 MED ORDER — GADOBENATE DIMEGLUMINE 529 MG/ML IV SOLN
15.0000 mL | Freq: Once | INTRAVENOUS | Status: AC
  Administered 2014-11-28: 15 mL via INTRAVENOUS

## 2014-11-28 MED ORDER — LACTATED RINGERS IV SOLN
INTRAVENOUS | Status: DC | PRN
  Administered 2014-11-28: 15:00:00 via INTRAVENOUS

## 2014-11-28 MED ORDER — PROPOFOL 10 MG/ML IV BOLUS
INTRAVENOUS | Status: DC | PRN
  Administered 2014-11-28: 100 mg via INTRAVENOUS

## 2014-11-28 MED ORDER — MIDAZOLAM HCL 5 MG/5ML IJ SOLN
INTRAMUSCULAR | Status: DC | PRN
  Administered 2014-11-28: 2 mg via INTRAVENOUS

## 2014-11-28 MED ORDER — SODIUM CHLORIDE 0.9 % IV SOLN
INTRAVENOUS | Status: DC
  Administered 2014-11-30 – 2014-12-02 (×4): via INTRAVENOUS

## 2014-11-28 MED ORDER — FENTANYL CITRATE (PF) 100 MCG/2ML IJ SOLN
INTRAMUSCULAR | Status: DC | PRN
  Administered 2014-11-28: 50 ug via INTRAVENOUS
  Administered 2014-11-28: 100 ug via INTRAVENOUS
  Administered 2014-11-28 (×2): 50 ug via INTRAVENOUS

## 2014-11-28 MED ORDER — OXYCODONE-ACETAMINOPHEN 5-325 MG PO TABS
1.0000 | ORAL_TABLET | ORAL | Status: DC | PRN
  Administered 2014-11-28 – 2014-12-03 (×16): 2 via ORAL
  Filled 2014-11-28 (×18): qty 2

## 2014-11-28 MED ORDER — ONDANSETRON HCL 4 MG/2ML IJ SOLN
INTRAMUSCULAR | Status: DC | PRN
  Administered 2014-11-28: 4 mg via INTRAVENOUS

## 2014-11-28 MED ORDER — VANCOMYCIN HCL 10 G IV SOLR
1250.0000 mg | INTRAVENOUS | Status: DC
  Administered 2014-11-28 – 2014-11-30 (×3): 1250 mg via INTRAVENOUS
  Filled 2014-11-28 (×4): qty 1250

## 2014-11-28 MED ORDER — PROPOFOL 10 MG/ML IV BOLUS
INTRAVENOUS | Status: AC
  Filled 2014-11-28: qty 20

## 2014-11-28 MED ORDER — LIDOCAINE HCL (CARDIAC) 20 MG/ML IV SOLN
INTRAVENOUS | Status: DC | PRN
  Administered 2014-11-28: 40 mg via INTRAVENOUS

## 2014-11-28 MED ORDER — FENTANYL CITRATE (PF) 100 MCG/2ML IJ SOLN: 25.0000 ug | INTRAMUSCULAR | Status: DC | PRN

## 2014-11-28 MED ORDER — CARVEDILOL 12.5 MG PO TABS
12.5000 mg | ORAL_TABLET | Freq: Two times a day (BID) | ORAL | Status: DC
  Administered 2014-11-28 – 2014-12-02 (×8): 12.5 mg via ORAL
  Filled 2014-11-28 (×8): qty 1

## 2014-11-28 MED ORDER — FENTANYL CITRATE (PF) 250 MCG/5ML IJ SOLN
INTRAMUSCULAR | Status: AC
  Filled 2014-11-28: qty 5

## 2014-11-28 MED ORDER — LACTATED RINGERS IV SOLN
INTRAVENOUS | Status: DC
  Administered 2014-11-28: 14:00:00 via INTRAVENOUS

## 2014-11-28 MED ORDER — BACITRACIN ZINC 500 UNIT/GM EX OINT
TOPICAL_OINTMENT | CUTANEOUS | Status: DC | PRN
  Administered 2014-11-28: 1 via TOPICAL

## 2014-11-28 SURGICAL SUPPLY — 30 items
BANDAGE ELASTIC 4 VELCRO ST LF (GAUZE/BANDAGES/DRESSINGS) ×2 IMPLANT
BLADE AVERAGE 25X9 (BLADE) IMPLANT
BNDG CONFORM 3 STRL LF (GAUZE/BANDAGES/DRESSINGS) ×2 IMPLANT
BNDG GAUZE ELAST 4 BULKY (GAUZE/BANDAGES/DRESSINGS) ×2 IMPLANT
CANISTER SUCTION 2500CC (MISCELLANEOUS) ×2 IMPLANT
CLIP TI MEDIUM 6 (CLIP) ×2 IMPLANT
COVER SURGICAL LIGHT HANDLE (MISCELLANEOUS) ×2 IMPLANT
DRAPE EXTREMITY T 121X128X90 (DRAPE) ×2 IMPLANT
ELECT REM PT RETURN 9FT ADLT (ELECTROSURGICAL) ×2
ELECTRODE REM PT RTRN 9FT ADLT (ELECTROSURGICAL) ×1 IMPLANT
GAUZE SPONGE 4X4 12PLY STRL (GAUZE/BANDAGES/DRESSINGS) ×2 IMPLANT
GLOVE BIO SURGEON STRL SZ7.5 (GLOVE) ×2 IMPLANT
GLOVE BIOGEL PI IND STRL 8 (GLOVE) ×1 IMPLANT
GLOVE BIOGEL PI INDICATOR 8 (GLOVE) ×1
GOWN STRL REUS W/ TWL LRG LVL3 (GOWN DISPOSABLE) ×3 IMPLANT
GOWN STRL REUS W/TWL LRG LVL3 (GOWN DISPOSABLE) ×3
KIT BASIN OR (CUSTOM PROCEDURE TRAY) ×2 IMPLANT
KIT ROOM TURNOVER OR (KITS) ×2 IMPLANT
NS IRRIG 1000ML POUR BTL (IV SOLUTION) ×2 IMPLANT
PACK GENERAL/GYN (CUSTOM PROCEDURE TRAY) ×2 IMPLANT
PAD ARMBOARD 7.5X6 YLW CONV (MISCELLANEOUS) ×4 IMPLANT
SPECIMEN JAR SMALL (MISCELLANEOUS) ×2 IMPLANT
SPONGE GAUZE 4X4 12PLY STER LF (GAUZE/BANDAGES/DRESSINGS) ×2 IMPLANT
SUT ETHILON 3 0 PS 1 (SUTURE) ×2 IMPLANT
SUT VIC AB 3-0 SH 27 (SUTURE) ×1
SUT VIC AB 3-0 SH 27X BRD (SUTURE) ×1 IMPLANT
SWAB COLLECTION DEVICE MRSA (MISCELLANEOUS) ×2 IMPLANT
TUBE ANAEROBIC SPECIMEN COL (MISCELLANEOUS) ×2 IMPLANT
UNDERPAD 30X30 INCONTINENT (UNDERPADS AND DIAPERS) ×2 IMPLANT
WATER STERILE IRR 1000ML POUR (IV SOLUTION) ×2 IMPLANT

## 2014-11-28 NOTE — Op Note (Signed)
    NAME: Shirley Chen   MRN: [id number] DOB: Jan 26, 1956    DATE OF OPERATION: 11/28/2014  PREOP DIAGNOSIS: gangrene of the right great toe with osteomyelitis  POSTOP DIAGNOSIS: same  PROCEDURE: Ray amputation of the right great toe  SURGEON: Di Kindle. Edilia Bo, MD, FACS  ASSIST: none  ANESTHESIA: Gen.   EBL: minimal  INDICATIONS: [name] is a 59 y.o. female  who was undergone femoral anterior tibial artery bypass. She has a gangrenous right great toe and presents for amputation.  FINDINGS: there was no deep space infection.  TECHNIQUE: The patient was taken to the operating room and received a general anesthetic. The right foot was prepped and draped in usual sterile fashion. A tennis racquet incision was made encompassing the right great toe. The dissection was carried down to tarsal bone. This was divided using a TPS saw. The toe was removed in its entirety. The wound was irrigated with Some amounts of saline. There was no deep space infection noted. The deep layer was closed loosely with 3-0 Vicryl. The skin was closed loosely with 3-0 nylon's. A sterile dressing was applied. The patient tolerated the well was transferred to the recovery room in stable condition. All needle and sponge counts were correct.  [name], MD, FACS [facility]  DATE OF DICTATION:   11/28/2014

## 2014-11-28 NOTE — H&P (View-Only) (Signed)
   VASCULAR SURGERY ASSESSMENT & PLAN:  * Cellulitis improved slightly.  *  I will proceed with Ray amputation of the right great toe tomorrow. I will likely need to leave this open.  * Continue intravenous and probiotic.  SUBJECTIVE: Pain better controlled.  PHYSICAL EXAM: Filed Vitals:   11/26/14 1500 11/26/14 1704 11/26/14 2134 11/27/14 0648  BP: 142/94 179/65 148/78 179/77  Pulse: 72 77 64 81  Temp:  98.6 F (37 C) 98.4 F (36.9 C) 98.2 F (36.8 C)  TempSrc:  Oral Oral Oral  Resp: 20 20 18 16  SpO2: 94% 97% 94% 91%   Cellulitis right foot slightly improved.  Moderate drainage.  LABS: Lab Results  Component Value Date   WBC 10.3 11/27/2014   HGB 9.8* 11/27/2014   HCT 30.7* 11/27/2014   MCV 89.0 11/27/2014   PLT 294 11/27/2014   Lab Results  Component Value Date   CREATININE 0.62 11/27/2014   Lab Results  Component Value Date   INR 1.07 10/20/2014   CBG (last 3)   Recent Labs  11/26/14 2136 11/27/14 0647 11/27/14 1119  GLUCAP 233* 157* 169*    Principal Problem:   Necrotic toes Active Problems:   Tobacco abuse   Atherosclerosis of native arteries of the extremities with ulceration   PAD (peripheral artery disease)   Osteomyelitis   HTN (hypertension)   Diabetes mellitus with peripheral vascular disease   Shirley Chen Beeper: [phone] 11/27/2014    

## 2014-11-28 NOTE — Anesthesia Procedure Notes (Signed)
Procedure Name: LMA Insertion Date/Time: 11/28/2014 3:53 PM Performed by: Wray Kearns A Pre-anesthesia Checklist: Patient identified, Timeout performed, Emergency Drugs available, Suction available and Patient being monitored Patient Re-evaluated:Patient Re-evaluated prior to inductionOxygen Delivery Method: Circle system utilized Preoxygenation: Pre-oxygenation with 100% oxygen Intubation Type: IV induction Ventilation: Mask ventilation without difficulty LMA: LMA inserted LMA Size: 4.0 Tube type: Oral Number of attempts: 1 Placement Confirmation: breath sounds checked- equal and bilateral and positive ETCO2 Tube secured with: Tape Dental Injury: Teeth and Oropharynx as per pre-operative assessment

## 2014-11-28 NOTE — Progress Notes (Signed)
ANTIBIOTIC CONSULT NOTE - FOLLOW UP  Pharmacy Consult for Vancomycin Indication: RLE cellulitis/ great toe osteomyelitis  No Known Allergies  Patient Measurements: Height:  (162.6 cm) Weight: 188 lb (85.276 kg) IBW/kg (Calculated) : 54.7  Vital Signs: Temp: 98.4 F (36.9 C) (08/23 1300) BP: 185/75 mmHg (08/23 1300) Pulse Rate: 87 (08/23 1300) Intake/Output from previous day: 08/22 0701 - 08/23 0700 In: 530 [P.O.:480; IV Piggyback:50] Out: 1 [Urine:1] Intake/Output from this shift:    Labs:  Recent Labs  11/26/14 1128 11/27/14 0430 11/28/14 0521  WBC 8.7 10.3 10.5  HGB 11.3* 9.8* 9.8*  PLT 328 294 285  CREATININE 0.57 0.62 1.43*   Estimated Creatinine Clearance: 45.3 mL/min (by C-G formula based on Cr of 1.43). No results for input(s): VANCOTROUGH, VANCOPEAK, VANCORANDOM, GENTTROUGH, GENTPEAK, GENTRANDOM, TOBRATROUGH, TOBRAPEAK, TOBRARND, AMIKACINPEAK, AMIKACINTROU, AMIKACIN in the last 72 hours.   Microbiology: Recent Results (from the past 720 hour(s))  Wound culture     Status: None (Preliminary result)   Collection Time: 11/26/14 11:11 AM  Result Value Ref Range Status   Specimen Description WOUND RIGHT FOOT  Final   Special Requests Immunocompromised  Final   Gram Stain   Final    RARE WBC PRESENT, PREDOMINANTLY PMN NO SQUAMOUS EPITHELIAL CELLS SEEN MODERATE GRAM POSITIVE COCCI IN PAIRS Performed at [facility]    Culture   Final    MODERATE STAPHYLOCOCCUS AUREUS Performed at [facility]    Report Status PENDING  Incomplete  Surgical pcr screen     Status: None   Collection Time: 11/28/14  8:43 AM  Result Value Ref Range Status   MRSA, PCR NEGATIVE NEGATIVE Final   Staphylococcus aureus NEGATIVE NEGATIVE Final    Comment:        The Xpert SA Assay (FDA approved for NASAL specimens in patients over 47 years of age), is one component of a comprehensive surveillance program.  Test performance has been validated by  [facility] for patients greater than or equal to 72 year old. It is not intended to diagnose infection nor to guide or monitor treatment.     Anti-infectives    Start     Dose/Rate Route Frequency Ordered Stop   11/26/14 1700  piperacillin-tazobactam (ZOSYN) IVPB 3.375 g     3.375 g 12.5 mL/hr over 240 Minutes Intravenous Every 8 hours 11/26/14 1604     11/26/14 1700  vancomycin (VANCOCIN) 1,250 mg in sodium chloride 0.9 % 250 mL IVPB     1,250 mg 166.7 mL/hr over 90 Minutes Intravenous Every 12 hours 11/26/14 1630        Assessment: 59 year old female with right foot osteomyelitis, cellulitis with plans for toe amputation 8/23.  Pt was started on Vancomycin and Zosyn empirically.  Overnight SCr has more than doubled 0.62 >> 1.43.  Will discontinue Vancomycin and order random level prior to next dose.  Of note, last dose of Vancomycin was 8/22 0650.  Two doses have been missed since then (which given her change in renal function may not have been a bad thing).   Goal of Therapy:  Vancomycin trough level 15-20 mcg/ml  Plan:  Random Vancomycin level this PM Discontinue standing Vancomycin orders Redose Vancomycin pending level results Follow-up renal function and clinical progress  [employer], Pharm.D., BCPS Clinical Pharmacist Pager [phone] 11/28/2014 1:57 PM

## 2014-11-28 NOTE — Anesthesia Postprocedure Evaluation (Signed)
  Anesthesia Post-op Note  Patient: Shirley Chen  Procedure(s) Performed: Procedure(s): AMPUTATION RIGHT GREAT TOE (Right)  Patient Location: PACU  Anesthesia Type:General  Level of Consciousness: awake and alert   Airway and Oxygen Therapy: Patient Spontanous Breathing  Post-op Pain: Controlled  Post-op Assessment: Post-op Vital signs reviewed, Patient's Cardiovascular Status Stable and Respiratory Function Stable  Post-op Vital Signs: Reviewed  Filed Vitals:   11/28/14 1712  BP: 160/61  Pulse: 77  Temp:   Resp: 17    Complications: No apparent anesthesia complications

## 2014-11-28 NOTE — Progress Notes (Signed)
PROGRESS NOTE  Lauraine Crespo BEM:[id number] DOB: 15-Dec-1955 DOA: 11/26/2014 PCP: [facility]   Brief history 59 year old female with history of diabetes mellitus, peripheral vascular disease, continued tobacco use, and hypertension presented with new wounds on her right hallux and lateral hee as well as purulent drainage on the plantar aspect of her toe. l. The patient was recently discharged from the hospital on 10/30/2014 after right femoral to anterior tibial bypass using the right greater saphenous vein. During that admission, the patient was noted to have a diabetic foot infection and was started on intravenous vancomycin and Zosyn. After angiography was performed on 10/23/2014, that showed Infrainguinal arterial occlusive disease. She was subsequently taken to the operating room on 10/27/2014 where Dr. Scot Dock performed a Right common femoral artery to anterior tibial artery bypass with non-reversed translocated saphenous vein graft. MRI of her foot on 10/21/2058 revealed soft tissue edema without enhancement, abscess, or frank osteomyelitis, but did show some marrow edema on the right first distal phalanx. The patient was ultimately discharged home with trimethoprim sulfamethoxazole. Assessment/Plan: Acute osteomyelitis of the right foot/diabetic foot infection in the setting of peripheral vascular disease -Continue vancomycin and Zosyn pending culture data -Appreciate vascular surgery consultation and follow-up -Plans noted for first ray amputation on 11/28/14 -ESR 68  -CRP--4.6 -repeat MRI foot--acute osteomyelitis distal and proximal phalanges of the hallux, septic arthritis of first inter-phalangeal joint, myositis of plantar musculature -followup intraoperative cultures and adjust abx -11/28/14--R-ray amputation of hallux -can go home on oral abx -appreciate Dr. Scot Dock Peripheral vascular disease -10/27/2014--Dr. Scot Dock performed Right common femoral  artery to anterior tibial artery bypass with non-reversed translocated saphenous vein graft Diabetes mellitus type 2, poorly controlled -10/21/2014 hemoglobin A1c 12.9 -pt was only taking 75/25 once daily at home (instead of BID) -she would like to switch to once daily insulin -start Lantus 5 units at hs which will likely need to be titrated up -NovoLog sliding scale Hypertension -Continue carvedilol and hydralazine -Increase carvedilol to 12.5 TWICE a day  Hyperlipidemia -Continue Lipitor Tobacco abuse -40-pack-year history -pt has quit Hypokalemia -Replete -Check magnesium   Family Communication: Pt at beside Disposition Plan: Home 1-2 days   Procedures/Studies: Mr Foot Right W Wo Contrast  11/28/2014   CLINICAL DATA:  Nonhealing ulcer of the right great toe with drainage. Ulcer along the right medial heel.  EXAM: MRI OF THE RIGHT FOOT WITHOUT AND WITH CONTRAST  TECHNIQUE: Multiplanar, multisequence MR imaging was performed both before and after administration of intravenous contrast. Osteomyelitis protocol MRI of the foot was obtained, to include the entire foot and ankle. This protocol uses a large field of view to cover the entire foot and ankle, and is suitable for assessing bony structures for osteomyelitis. Due to the large field of view and imaging plane choice, this protocol is less sensitive for assessing small structures such as ligamentous structures of the foot and ankle, compared to a dedicated forefoot or dedicated hindfoot exam.  CONTRAST:  60m MULTIHANCE GADOBENATE DIMEGLUMINE 529 MG/ML IV SOLN  COMPARISON:  Multiple exams, including 11/26/2014 and 10/21/2014  FINDINGS: Abnormal edema and enhancement in the proximal and distal phalanges of the great toe, with extensive surrounding subcutaneous/soft tissue enhancement. Ulcerations of the great toe are present, especially medially, and the possibility of septic arthritis of the interphalangeal joint of the great toe is  raised.  Low-level edema medially in the anterior talar dome and adjacent talar neck, likely attributable to  degenerative arthropathy at the tibiotalar joint and possible underlying non-fragmented osteochondral lesion of the medial talar dome. I am doubtful of osteomyelitis in this vicinity.  Lisfranc ligament intact. No compelling findings of osteomyelitis in the hindfoot, with the understanding that part of the hindfoot is obscured by the metal artifact. Is  Plantar and Achilles calcaneal spurs. Dorsal midfoot spurring. Low-level increased T2 signal along the medial band of the plantar fascia proximally, potentially from plantar fasciitis.  Edema and infiltrative enhancement tracks along the plantar musculature of the foot, and also in the dorsal subcutaneous tissues of the foot, suggesting cellulitis and myositis.  IMPRESSION: 1. Active osteomyelitis of the proximal and distal phalanges of the great toe, with gas in the soft tissues medially, ulceration especially medially, and concern for septic arthritis of the interphalangeal joint. 2. Myositis of the plantar musculature of the foot with dorsal cellulitis in the forefoot. 3. Non-fragmented osteochondral lesion of the medial talar dome. No definite hindfoot osteomyelitis. 4. Plantar fasciitis. 5. Distal tibial and fibular deformity due to prior fracture. A lag screw in the distal tibia creates some artifact which obscures surrounding structures. 6. Degenerative arthropathy at the tibiotalar articulation.   Electronically Signed   By: Van Clines M.D.   On: 11/28/2014 08:22   Dg Toe Great Right  11/26/2014   CLINICAL DATA:  Diabetic with infection of the right great toe.  EXAM: RIGHT GREAT TOE - 3 VIEWS  COMPARISON:  Right foot x-ray 10/20/2014.  FINDINGS: Severe soft tissue swelling involving the great toe. Loss of the cortical margin of the proximal aspect of the distal phalanx, new since the examination 5 weeks ago. Bone fragments arising from the  head of the proximal phalanx, also new. No evidence of pathologic fracture. Mild narrowing of the IP joint space.  IMPRESSION: Osteomyelitis involving the head of the proximal phalanx and the base of the distal phalanx with IP joint infection also suspected.   Electronically Signed   By: Evangeline Dakin M.D.   On: 11/26/2014 12:37         Subjective: Patient denies fevers, chills, headache, chest pain, dyspnea, nausea, vomiting, diarrhea, abdominal pain, dysuria, hematuria   Objective: Filed Vitals:   11/28/14 1657 11/28/14 1700 11/28/14 1706 11/28/14 1712  BP: 160/66   160/61  Pulse:  77 78 77  Temp:   97.7 F (36.5 C)   TempSrc:      Resp:  _0 Height:      Weight:      SpO2:  97% 97% 97%    Intake/Output Summary (Last 24 hours) at 11/28/14 1851 Last data filed at 11/28/14 1708  Gross per 24 hour  Intake    930 ml  Output     25 ml  Net    905 ml   Weight change:  Exam:   General:  Pt is alert, follows commands appropriately, not in acute distress  HEENT: No icterus, No thrush, No neck mass, [place]/AT  Cardiovascular: RRR, S1/S2, no rubs, no gallops  Respiratory: CTA bilaterally, no wheezing, no crackles, no rhonchi  Abdomen: Soft/+BS, non tender, non distended, no guarding; no hepatosplenomegaly   Extremities: No edema, No lymphangitis, No petechiae, No rashes, no synovitis right foot necrotic first toe with malodorous drainage. Erythema spreading to the dorsum and plantar surface of the midfoot. No crepitance.   Data Reviewed: Basic Metabolic Panel:  Recent Labs Lab 11/26/14 1128 11/27/14 0430 11/27/14 1643 11/28/14 0521  NA 140 140  --  142  K 3.1* 2.8*  --  3.8  CL 103 105  --  111  CO2 28 29  --  24  GLUCOSE 231* 171*  --  129*  BUN 10 11  --  11  CREATININE 0.57 0.62  --  1.43*  CALCIUM 8.9 8.3*  --  8.5*  MG  --   --  1.7  --    Liver Function Tests: No results for input(s): AST, ALT, ALKPHOS, BILITOT, PROT, ALBUMIN in the last 168  hours. No results for input(s): LIPASE, AMYLASE in the last 168 hours. No results for input(s): AMMONIA in the last 168 hours. CBC:  Recent Labs Lab 11/26/14 1128 11/27/14 0430 11/28/14 0521  WBC 8.7 10.3 10.5  NEUTROABS 5.8  --   --   HGB 11.3* 9.8* 9.8*  HCT 33.8* 30.7* 30.2*  MCV 88.5 89.0 89.1  PLT 328 294 285   Cardiac Enzymes: No results for input(s): CKTOTAL, CKMB, CKMBINDEX, TROPONINI in the last 168 hours. BNP: Invalid input(s): POCBNP CBG:  Recent Labs Lab 11/28/14 0408 11/28/14 0809 11/28/14 1123 11/28/14 1410 11/28/14 1643  GLUCAP 131* 113* 121* 121* 120*    Recent Results (from the past 240 hour(s))  Wound culture     Status: None (Preliminary result)   Collection Time: 11/26/14 11:11 AM  Result Value Ref Range Status   Specimen Description WOUND RIGHT FOOT  Final   Special Requests Immunocompromised  Final   Gram Stain   Final    RARE WBC PRESENT, PREDOMINANTLY PMN NO SQUAMOUS EPITHELIAL CELLS SEEN MODERATE GRAM POSITIVE COCCI IN PAIRS Performed at [facility]    Culture   Final    MODERATE STAPHYLOCOCCUS AUREUS Performed at [facility]    Report Status PENDING  Incomplete  Surgical pcr screen     Status: None   Collection Time: 11/28/14  8:43 AM  Result Value Ref Range Status   MRSA, PCR NEGATIVE NEGATIVE Final   Staphylococcus aureus NEGATIVE NEGATIVE Final    Comment:        The Xpert SA Assay (FDA approved for NASAL specimens in patients over 67 years of age), is one component of a comprehensive surveillance program.  Test performance has been validated by [facility] for patients greater than or equal to 39 year old. It is not intended to diagnose infection nor to guide or monitor treatment.      Scheduled Meds: . atorvastatin  10 mg Oral Daily  . carvedilol  6.25 mg Oral BID WC  . enoxaparin (LOVENOX) injection  40 mg Subcutaneous Q24H  . hydrALAZINE  25 mg Oral 3 times per day  . insulin aspart  0-15  Units Subcutaneous 6 times per day  . insulin glargine  5 Units Subcutaneous QHS  . nicotine  21 mg Transdermal Daily  . piperacillin-tazobactam (ZOSYN)  IV  3.375 g Intravenous Q8H   Continuous Infusions: . sodium chloride    . lactated ringers 50 mL/hr at 11/28/14 1425     Reniya Mcclees, DO  [facility] Pager [phone]  If 7PM-7AM, please contact night-coverage [email or web address] Password [facility] 11/28/2014, 6:51 PM   LOS: 2 days

## 2014-11-28 NOTE — Interval H&P Note (Signed)
History and Physical Interval Note:  11/28/2014 3:11 PM  Shirley Chen  has presented today for surgery, with the diagnosis of Osteomyelitis of right great toe M86.68  The various methods of treatment have been discussed with the patient and family. After consideration of risks, benefits and other options for treatment, the patient has consented to  Procedure(s): AMPUTATION GREAT TOE (Right) as a surgical intervention .  The patient's history has been reviewed, patient examined, no change in status, stable for surgery.  I have reviewed the patient's chart and labs.  Questions were answered to the patient's satisfaction.     [name]

## 2014-11-28 NOTE — Progress Notes (Signed)
ANTIBIOTIC CONSULT NOTE - FOLLOW UP  Pharmacy Consult:  Vancomycin Indication:  Right foot osteomyelitis  No Known Allergies  Patient Measurements: Height:  (162.6 cm) Weight: 188 lb (85.276 kg) IBW/kg (Calculated) : 54.7  Vital Signs: Temp: 97.7 F (36.5 C) (08/23 1706) BP: 160/61 mmHg (08/23 1712) Pulse Rate: 77 (08/23 1712) Intake/Output from previous day: 08/22 0701 - 08/23 0700 In: 530 [P.O.:480; IV Piggyback:50] Out: 1 [Urine:1]  Labs:  Recent Labs  11/26/14 1128 11/27/14 0430 11/28/14 0521 11/28/14 1851  WBC 8.7 10.3 10.5  --   HGB 11.3* 9.8* 9.8*  --   PLT 328 294 285  --   CREATININE 0.57 0.62 1.43* 1.60*   Estimated Creatinine Clearance: 40.5 mL/min (by C-G formula based on Cr of 1.6).  Recent Labs  11/28/14 1851  VANCORANDOM 10     Microbiology: Recent Results (from the past 720 hour(s))  Wound culture     Status: None (Preliminary result)   Collection Time: 11/26/14 11:11 AM  Result Value Ref Range Status   Specimen Description WOUND RIGHT FOOT  Final   Special Requests Immunocompromised  Final   Gram Stain   Final    RARE WBC PRESENT, PREDOMINANTLY PMN NO SQUAMOUS EPITHELIAL CELLS SEEN MODERATE GRAM POSITIVE COCCI IN PAIRS Performed at [facility]    Culture   Final    MODERATE STAPHYLOCOCCUS AUREUS Performed at [facility]    Report Status PENDING  Incomplete  Surgical pcr screen     Status: None   Collection Time: 11/28/14  8:43 AM  Result Value Ref Range Status   MRSA, PCR NEGATIVE NEGATIVE Final   Staphylococcus aureus NEGATIVE NEGATIVE Final    Comment:        The Xpert SA Assay (FDA approved for NASAL specimens in patients over 24 years of age), is one component of a comprehensive surveillance program.  Test performance has been validated by [facility] for patients greater than or equal to 70 year old. It is not intended to diagnose infection nor to guide or monitor treatment.        Assessment: 59 YOF with right foot osteomyelitis s/p right great toe amputation today.  Patient to continue on vancomycin and Zosyn.  She has worsening AKI and several missed doses, prompting [place] to check a vancomycin random level.  Vancomycin level is sub-therapeutic and she did not receive any vancomycin in the OR today.  Vanc 8/21 >> Zosyn 8/21 >>     Goal of Therapy:  Vancomycin trough level 15-20 mcg/ml   Plan:  - Vanc  IV Q24H - Continue Zosyn as ordered - Monitor renal fxn closely and check vanc level as indicated    Bernon Arviso D. Laney Potash, PharmD, BCPS Pager:  [phone] 11/28/2014, 7:48 PM

## 2014-11-28 NOTE — Anesthesia Preprocedure Evaluation (Addendum)
Anesthesia Evaluation  Patient identified by MRN, date of birth, ID band Patient awake    Reviewed: Allergy & Precautions, NPO status , Patient's Chart, lab work & pertinent test results, reviewed documented beta blocker date and time   Airway Mallampati: II  TM Distance: >3 FB Neck ROM: Full    Dental  (+) Edentulous Upper, Partial Lower   Pulmonary Current Smoker,  breath sounds clear to auscultation        Cardiovascular hypertension, Pt. on medications and Pt. on home beta blockers Rhythm:Regular Rate:Normal     Neuro/Psych    GI/Hepatic   Endo/Other  diabetes, Insulin Dependent  Renal/GU      Musculoskeletal   Abdominal   Peds  Hematology   Anesthesia Other Findings   Reproductive/Obstetrics                            Anesthesia Physical Anesthesia Plan  ASA: III  Anesthesia Plan: General   Post-op Pain Management:    Induction: Intravenous  Airway Management Planned: LMA  Additional Equipment:   Intra-op Plan:   Post-operative Plan: Extubation in OR  Informed Consent: I have reviewed the patients History and Physical, chart, labs and discussed the procedure including the risks, benefits and alternatives for the proposed anesthesia with the patient or authorized representative who has indicated his/her understanding and acceptance.   Dental advisory given  Plan Discussed with: CRNA and Anesthesiologist  Anesthesia Plan Comments:         Anesthesia Quick Evaluation

## 2014-11-28 NOTE — Transfer of Care (Signed)
Immediate Anesthesia Transfer of Care Note  Patient: Shirley Chen  Procedure(s) Performed: Procedure(s): AMPUTATION RIGHT GREAT TOE (Right)  Patient Location: PACU  Anesthesia Type:General  Level of Consciousness: awake, oriented, sedated, patient cooperative and responds to stimulation  Airway & Oxygen Therapy: Patient Spontanous Breathing and Patient connected to nasal cannula oxygen  Post-op Assessment: Report given to RN, Post -op Vital signs reviewed and stable, Patient moving all extremities and Patient moving all extremities X 4  Post vital signs: Reviewed and stable  Last Vitals:  Filed Vitals:   11/28/14 1300  BP: 185/75  Pulse: 87  Temp: 36.9 C  Resp: 18    Complications: No apparent anesthesia complications

## 2014-11-29 ENCOUNTER — Encounter (HOSPITAL_COMMUNITY): Payer: Self-pay | Admitting: Vascular Surgery

## 2014-11-29 ENCOUNTER — Encounter: Payer: 59 | Admitting: Vascular Surgery

## 2014-11-29 ENCOUNTER — Inpatient Hospital Stay (HOSPITAL_COMMUNITY): Payer: 59

## 2014-11-29 DIAGNOSIS — E1151 Type 2 diabetes mellitus with diabetic peripheral angiopathy without gangrene: Secondary | ICD-10-CM

## 2014-11-29 DIAGNOSIS — I96 Gangrene, not elsewhere classified: Secondary | ICD-10-CM

## 2014-11-29 DIAGNOSIS — N179 Acute kidney failure, unspecified: Secondary | ICD-10-CM

## 2014-11-29 DIAGNOSIS — Z720 Tobacco use: Secondary | ICD-10-CM

## 2014-11-29 DIAGNOSIS — L089 Local infection of the skin and subcutaneous tissue, unspecified: Secondary | ICD-10-CM

## 2014-11-29 DIAGNOSIS — I10 Essential (primary) hypertension: Secondary | ICD-10-CM

## 2014-11-29 DIAGNOSIS — M86171 Other acute osteomyelitis, right ankle and foot: Secondary | ICD-10-CM

## 2014-11-29 DIAGNOSIS — E1169 Type 2 diabetes mellitus with other specified complication: Secondary | ICD-10-CM

## 2014-11-29 DIAGNOSIS — E876 Hypokalemia: Secondary | ICD-10-CM | POA: Clinically undetermined

## 2014-11-29 LAB — URINALYSIS, ROUTINE W REFLEX MICROSCOPIC
Bilirubin Urine: NEGATIVE
Glucose, UA: NEGATIVE mg/dL
Hgb urine dipstick: NEGATIVE
Ketones, ur: NEGATIVE mg/dL
Leukocytes, UA: NEGATIVE
Nitrite: NEGATIVE
Protein, ur: NEGATIVE mg/dL
Specific Gravity, Urine: 1.017 (ref 1.005–1.030)
Urobilinogen, UA: 0.2 mg/dL (ref 0.0–1.0)
pH: 5 (ref 5.0–8.0)

## 2014-11-29 LAB — BASIC METABOLIC PANEL
Anion gap: 8 (ref 5–15)
BUN: 14 mg/dL (ref 6–20)
CO2: 26 mmol/L (ref 22–32)
Calcium: 8.2 mg/dL — ABNORMAL LOW (ref 8.9–10.3)
Chloride: 105 mmol/L (ref 101–111)
Creatinine, Ser: 1.8 mg/dL — ABNORMAL HIGH (ref 0.44–1.00)
GFR calc Af Amer: 35 mL/min — ABNORMAL LOW (ref >60)
GFR calc non Af Amer: 30 mL/min — ABNORMAL LOW (ref >60)
Glucose, Bld: 178 mg/dL — ABNORMAL HIGH (ref 65–99)
Potassium: 3.4 mmol/L — ABNORMAL LOW (ref 3.5–5.1)
Sodium: 139 mmol/L (ref 135–145)

## 2014-11-29 LAB — CBC
HCT: 29.5 % — ABNORMAL LOW (ref 36.0–46.0)
Hemoglobin: 9.5 g/dL — ABNORMAL LOW (ref 12.0–15.0)
MCH: 28.9 pg (ref 26.0–34.0)
MCHC: 32.2 g/dL (ref 30.0–36.0)
MCV: 89.7 fL (ref 78.0–100.0)
Platelets: 295 10*3/uL (ref 150–400)
RBC: 3.29 MIL/uL — ABNORMAL LOW (ref 3.87–5.11)
RDW: 13.9 % (ref 11.5–15.5)
WBC: 13.2 10*3/uL — ABNORMAL HIGH (ref 4.0–10.5)

## 2014-11-29 LAB — GLUCOSE, CAPILLARY
Glucose-Capillary: 183 mg/dL — ABNORMAL HIGH (ref 65–99)
Glucose-Capillary: 183 mg/dL — ABNORMAL HIGH (ref 65–99)
Glucose-Capillary: 104 mg/dL — ABNORMAL HIGH (ref 65–99)
Glucose-Capillary: 200 mg/dL — ABNORMAL HIGH (ref 65–99)
Glucose-Capillary: 137 mg/dL — ABNORMAL HIGH (ref 65–99)
Glucose-Capillary: 158 mg/dL — ABNORMAL HIGH (ref 65–99)

## 2014-11-29 LAB — MAGNESIUM: Magnesium: 1.8 mg/dL (ref 1.7–2.4)

## 2014-11-29 LAB — SODIUM, URINE, RANDOM: Sodium, Ur: 60 mmol/L

## 2014-11-29 LAB — CREATININE, URINE, RANDOM: Creatinine, Urine: 117.08 mg/dL

## 2014-11-29 MED ORDER — CEFAZOLIN SODIUM-DEXTROSE 2-3 GM-% IV SOLR
2.0000 g | Freq: Three times a day (TID) | INTRAVENOUS | Status: DC
  Administered 2014-11-29 – 2014-12-02 (×9): 2 g via INTRAVENOUS
  Filled 2014-11-29 (×12): qty 50

## 2014-11-29 MED ORDER — POTASSIUM CHLORIDE CRYS ER 20 MEQ PO TBCR
40.0000 meq | EXTENDED_RELEASE_TABLET | Freq: Once | ORAL | Status: AC
  Administered 2014-11-29: 40 meq via ORAL
  Filled 2014-11-29: qty 2

## 2014-11-29 NOTE — Evaluation (Signed)
Occupational Therapy Evaluation Patient Details Name: Shirley Chen MRN: [id number] DOB: 27-Feb-1956 Today's Date: 11/29/2014    History of Present Illness Patient is a 59 y/o female presents with osteomyelitis right great toe s/p great toe amputation. PMH includes DM, PVD, tobacco use, and HTN. Pt recently d/ced from the hospital on 10/30/2014 after right femoral to anterior tibial bypass using the right greater saphenous vein.    Clinical Impression   Patient presenting with deconditioning and decreased functional mobility. Patient independent PTA. Patient currently requires supervision>mod I with ADLs and most functional transfers, except requires min assist for tub/shower transfers using RW and 3-in-1. Patient will benefit from acute OT to increase overall independence in the areas of ADLs, functional mobility, and overall safety in order to safely discharge home with intermittent assistance.     Follow Up Recommendations  No OT follow up;Supervision - Intermittent    Equipment Recommendations  3 in 1 bedside comode    Recommendations for Other Services  None at this time    Precautions / Restrictions Precautions Precautions: Fall Required Braces or Orthoses: Other Brace/Splint Other Brace/Splint: Darco shoe. Restrictions Weight Bearing Restrictions: Yes RLE Weight Bearing: Partial weight bearing RLE Partial Weight Bearing Percentage or Pounds: thru heel only in Darco shoe    Mobility Bed Mobility Overal bed mobility: Modified Independent General bed mobility comments: sit>supine  Transfers Overall transfer level: Needs assistance Equipment used: Rolling walker (2 wheeled) Transfers: Sit to/from Stand Sit to Stand: Supervision General transfer comment: Supervision for safety.     Balance Overall balance assessment: Needs assistance Sitting-balance support: No upper extremity supported;Feet supported Sitting balance-Leahy Scale: Good Sitting balance - Comments: Able  to donn sock sitting EOB without difficulty.    Standing balance support: Bilateral upper extremity supported;During functional activity Standing balance-Leahy Scale: Fair Standing balance comment: Recommend use of RW for support, able to take a few steps without RW however unsteady.    ADL Overall ADL's : Needs assistance/impaired General ADL Comments: Pt overall supervision -> mod I for ADLs at this time. Pt min assist for tub/shower transfers using 3-in-1. Pt able to don/doff darco boot independently. Plan to practice tub/shower transfer using 3-in-1 tomorrow.     Pertinent Vitals/Pain Pain Assessment: No/denies pain Pain Score: 0-No pain Faces Pain Scale: Hurts little more Pain Location: right foot Pain Descriptors / Indicators: Sore;Throbbing Pain Intervention(s): Monitored during session;Repositioned     Hand Dominance Right   Extremity/Trunk Assessment Upper Extremity Assessment Upper Extremity Assessment: Generalized weakness   Lower Extremity Assessment Lower Extremity Assessment: Defer to PT evaluation   Cervical / Trunk Assessment Cervical / Trunk Assessment: Normal   Communication Communication Communication: No difficulties   Cognition Arousal/Alertness: Awake/alert Behavior During Therapy: WFL for tasks assessed/performed Overall Cognitive Status: Within Functional Limits for tasks assessed              Home Living Family/patient expects to be discharged to:: Private residence Living Arrangements: Children Available Help at Discharge: Family;Available 24 hours/day Type of Home: Apartment Home Access: Stairs to enter Entrance Stairs-Number of Steps: 14 + 14 steps Entrance Stairs-Rails: Right Home Layout: One level     Bathroom Shower/Tub: Tub/shower unit;Door   [employer] Toilet: Standard     Home Equipment: Cane - single point   Prior Functioning/Environment Level of Independence: Independent with assistive device(s)  Comments: Pt using SPC  intermittently PTA.    OT Diagnosis: Generalized weakness;Acute pain   OT Problem List: Decreased activity tolerance;Decreased strength;Impaired balance (sitting and/or standing);Decreased safety awareness;Decreased  knowledge of use of DME or AE;Pain   OT Treatment/Interventions: Self-care/ADL training;Therapeutic exercise;Energy conservation;DME and/or AE instruction;Patient/family education;Therapeutic activities;Balance training    OT Goals(Current goals can be found in the care plan section) Acute Rehab OT Goals Patient Stated Goal: go home soon OT Goal Formulation: With patient Time For Goal Achievement: 12/13/14 Potential to Achieve Goals: Good ADL Goals Pt Will Perform Tub/Shower Transfer: with modified independence;3 in 1;rolling walker;ambulating;Tub transfer Pt/caregiver will Perform Home Exercise Program: Increased strength;Both right and left upper extremity;Independently;With theraband  OT Frequency: Min 2X/week   Barriers to D/C: None known at this time    End of Session Equipment Utilized During Treatment: Rolling walker;Other (comment) (Darco boot>RLE)  Activity Tolerance: Patient tolerated treatment well Patient left: in bed;with call bell/phone within reach   Time: 1440-1458 OT Time Calculation (min): 18 min Charges:  OT General Charges $OT Visit: 1 Procedure OT Evaluation $Initial OT Evaluation Tier I: 1 Procedure  Floyd Wade , MS, OTR/L, CLT Pager: [phone]  11/29/2014, 3:08 PM

## 2014-11-29 NOTE — Progress Notes (Signed)
Orthopedic Tech Progress Note Patient Details:  Shirley Chen 1955-08-20 [id number]  Ortho Devices Type of Ortho Device: Darco shoe Ortho Device/Splint Location: rle Ortho Device/Splint Interventions: Application   Shirley Chen 11/29/2014, 12:20 PM

## 2014-11-29 NOTE — Progress Notes (Addendum)
PROGRESS NOTE  Christl Fessenden CHE:[id number] DOB: 03/30/1956 DOA: 11/26/2014 PCP: [facility]   Brief history 59 year old female with history of diabetes mellitus, peripheral vascular disease, continued tobacco use, and hypertension presented with new wounds on her right hallux and lateral hee as well as purulent drainage on the plantar aspect of her toe. l. The patient was recently discharged from the hospital on 10/30/2014 after right femoral to anterior tibial bypass using the right greater saphenous vein. During that admission, the patient was noted to have a diabetic foot infection and was started on intravenous vancomycin and Zosyn. After angiography was performed on 10/23/2014, that showed Infrainguinal arterial occlusive disease. She was subsequently taken to the operating room on 10/27/2014 where Dr. Scot Dock performed a Right common femoral artery to anterior tibial artery bypass with non-reversed translocated saphenous vein graft. MRI of her foot on 10/21/2014 revealed soft tissue edema without enhancement, abscess, or frank osteomyelitis, but did show some marrow edema on the right first distal phalanx. The patient was ultimately discharged home with trimethoprim sulfamethoxazole. Assessment/Plan: Acute osteomyelitis of the right foot/diabetic foot infection in the setting of peripheral vascular disease Patient status post ray amputation of right great toe 11/28/2014 per Dr. Scot Dock. Wound cultures obtained 11/26/2014 preliminary with moderate staph aureus. Intraoperative cultures obtained 11/28/2014 pending. ESR was 68. CRP 4.6. -repeat MRI foot--acute osteomyelitis distal and proximal phalanges of the hallux, septic arthritis of first inter-phalangeal joint, myositis of plantar musculature -can go home on oral abx pending culture results. Continue empiric IV vancomycin. Change IV Zosyn to IV Ancef. -appreciate Dr. Scot Dock Acute renal failure -??  Etiology. Patient is not on any nephrotoxic medications at this time. MRI was done of the foot with and without contrast on 11/28/2014. Vancomycin trough levels were subtherapeutic at 10 on 11/28/2014. May be secondary to a prerenal azotemia. Check a UA with cultures and sensitivities. Check a fractional excretion of sodium. Check a renal ultrasound. Place on IV fluids. Follow. Peripheral vascular disease -10/27/2014--Dr. Scot Dock performed Right common femoral artery to anterior tibial artery bypass with non-reversed translocated saphenous vein graft. Per vascular surgery. Diabetes mellitus type 2, poorly controlled -10/21/2014 hemoglobin A1c 12.9 -pt was only taking 75/25 once daily at home (instead of BID) -she would like to switch to once daily insulin - CBGs have ranged from 104-183. -Continue current regimen of Lantus 5 units daily at bedtime and sliding scale insulin. Outpatient follow-up. Hypertension -Continue current regimen of carvedilol and hydralazine Hyperlipidemia -Continue Lipitor Tobacco abuse -40-pack-year history -pt has quit Hypokalemia -Replete -Check magnesium   Antibiotics IV Vancomycin 11/28/2014 IV Zosyn 11/26/14>>>11/29/14 IV Ancef 11/29/2014   Family Communication: Updated patient and daughter at beside Disposition Plan: Home when renal function improves status trending down in 1-2 days   Procedures/Studies: Mr Foot Right W Wo Contrast  11/28/2014   CLINICAL DATA:  Nonhealing ulcer of the right great toe with drainage. Ulcer along the right medial heel.  EXAM: MRI OF THE RIGHT FOOT WITHOUT AND WITH CONTRAST  TECHNIQUE: Multiplanar, multisequence MR imaging was performed both before and after administration of intravenous contrast. Osteomyelitis protocol MRI of the foot was obtained, to include the entire foot and ankle. This protocol uses a large field of view to cover the entire foot and ankle, and is suitable for assessing bony structures for  osteomyelitis. Due to the large field of view and imaging plane choice, this protocol is less sensitive for assessing small structures  such as ligamentous structures of the foot and ankle, compared to a dedicated forefoot or dedicated hindfoot exam.  CONTRAST:  49m MULTIHANCE GADOBENATE DIMEGLUMINE 529 MG/ML IV SOLN  COMPARISON:  Multiple exams, including 11/26/2014 and 10/21/2014  FINDINGS: Abnormal edema and enhancement in the proximal and distal phalanges of the great toe, with extensive surrounding subcutaneous/soft tissue enhancement. Ulcerations of the great toe are present, especially medially, and the possibility of septic arthritis of the interphalangeal joint of the great toe is raised.  Low-level edema medially in the anterior talar dome and adjacent talar neck, likely attributable to degenerative arthropathy at the tibiotalar joint and possible underlying non-fragmented osteochondral lesion of the medial talar dome. I am doubtful of osteomyelitis in this vicinity.  Lisfranc ligament intact. No compelling findings of osteomyelitis in the hindfoot, with the understanding that part of the hindfoot is obscured by the metal artifact. Is  Plantar and Achilles calcaneal spurs. Dorsal midfoot spurring. Low-level increased T2 signal along the medial band of the plantar fascia proximally, potentially from plantar fasciitis.  Edema and infiltrative enhancement tracks along the plantar musculature of the foot, and also in the dorsal subcutaneous tissues of the foot, suggesting cellulitis and myositis.  IMPRESSION: 1. Active osteomyelitis of the proximal and distal phalanges of the great toe, with gas in the soft tissues medially, ulceration especially medially, and concern for septic arthritis of the interphalangeal joint. 2. Myositis of the plantar musculature of the foot with dorsal cellulitis in the forefoot. 3. Non-fragmented osteochondral lesion of the medial talar dome. No definite hindfoot osteomyelitis. 4.  Plantar fasciitis. 5. Distal tibial and fibular deformity due to prior fracture. A lag screw in the distal tibia creates some artifact which obscures surrounding structures. 6. Degenerative arthropathy at the tibiotalar articulation.   Electronically Signed   By: WVan ClinesM.D.   On: 11/28/2014 08:22   Dg Toe Great Right  11/26/2014   CLINICAL DATA:  Diabetic with infection of the right great toe.  EXAM: RIGHT GREAT TOE - 3 VIEWS  COMPARISON:  Right foot x-ray 10/20/2014.  FINDINGS: Severe soft tissue swelling involving the great toe. Loss of the cortical margin of the proximal aspect of the distal phalanx, new since the examination 5 weeks ago. Bone fragments arising from the head of the proximal phalanx, also new. No evidence of pathologic fracture. Mild narrowing of the IP joint space.  IMPRESSION: Osteomyelitis involving the head of the proximal phalanx and the base of the distal phalanx with IP joint infection also suspected.   Electronically Signed   By: TEvangeline DakinM.D.   On: 11/26/2014 12:37   status post ray amputation of right great toe per Dr. DScot Dock08/23/2016      Subjective: Patient states pain is controlled. Patient states has good urinary output. No chest pain. No shortness of breath. Awaiting PT evaluation.   Objective: Filed Vitals:   11/28/14 1706 11/28/14 1712 11/28/14 2139 11/29/14 0426  BP:  160/61 164/70 135/64  Pulse: 78 77 95 80  Temp: 97.7 F (36.5 C)  98.1 F (36.7 C) 98.2 F (36.8 C)  TempSrc:   Oral Oral  Resp: _0 Height:      Weight:      SpO2: 97% 97% 97% 93%    Intake/Output Summary (Last 24 hours) at 11/29/14 1008 Last data filed at 11/29/14 0426  Gross per 24 hour  Intake   1410 ml  Output     25 ml  Net  1385 ml   Weight change:  Exam:   General:  Pt is alert, follows commands. NAD.  HEENT: No icterus, No thrush, No neck mass, [place]/AT  Cardiovascular: RRR, S1/S2, no rubs, no gallops  Respiratory: CTA  bilaterally, no wheezing, no crackles, no rhonchi  Abdomen: Soft/+BS, non tender, non distended, no guarding; no hepatosplenomegaly   Extremities: No edema, No lymphangitis, No petechiae, No rashes, no synovitis. Foot in bandage.  Data Reviewed: Basic Metabolic Panel:  Recent Labs Lab 11/26/14 1128 11/27/14 0430 11/27/14 1643 11/28/14 0521 11/28/14 1851 11/29/14 0411  NA 140 140  --  142 142 139  K 3.1* 2.8*  --  3.8 3.9 3.4*  CL 103 105  --  111 106 105  CO2 28 29  --  _0 GLUCOSE 231* 171*  --  129* 122* 178*  BUN 10 11  --  _1 CREATININE 0.57 0.62  --  1.43* 1.60* 1.80*  CALCIUM 8.9 8.3*  --  8.5* 8.8* 8.2*  MG  --   --  1.7  --   --  1.8   Liver Function Tests: No results for input(s): AST, ALT, ALKPHOS, BILITOT, PROT, ALBUMIN in the last 168 hours. No results for input(s): LIPASE, AMYLASE in the last 168 hours. No results for input(s): AMMONIA in the last 168 hours. CBC:  Recent Labs Lab 11/26/14 1128 11/27/14 0430 11/28/14 0521 11/29/14 0411  WBC 8.7 10.3 10.5 13.2*  NEUTROABS 5.8  --   --   --   HGB 11.3* 9.8* 9.8* 9.5*  HCT 33.8* 30.7* 30.2* 29.5*  MCV 88.5 89.0 89.1 89.7  PLT 328 294 285 295   Cardiac Enzymes: No results for input(s): CKTOTAL, CKMB, CKMBINDEX, TROPONINI in the last 168 hours. BNP: Invalid input(s): POCBNP CBG:  Recent Labs Lab 11/28/14 1643 11/28/14 2141 11/29/14 0056 11/29/14 0427 11/29/14 0800  GLUCAP 120* 159* 183* 183* 104*    Recent Results (from the past 240 hour(s))  Wound culture     Status: None (Preliminary result)   Collection Time: 11/26/14 11:11 AM  Result Value Ref Range Status   Specimen Description WOUND RIGHT FOOT  Final   Special Requests Immunocompromised  Final   Gram Stain   Final    RARE WBC PRESENT, PREDOMINANTLY PMN NO SQUAMOUS EPITHELIAL CELLS SEEN MODERATE GRAM POSITIVE COCCI IN PAIRS Performed at [facility]    Culture   Final    MODERATE STAPHYLOCOCCUS  AUREUS Performed at [facility]    Report Status PENDING  Incomplete  Surgical pcr screen     Status: None   Collection Time: 11/28/14  8:43 AM  Result Value Ref Range Status   MRSA, PCR NEGATIVE NEGATIVE Final   Staphylococcus aureus NEGATIVE NEGATIVE Final    Comment:        The Xpert SA Assay (FDA approved for NASAL specimens in patients over 20 years of age), is one component of a comprehensive surveillance program.  Test performance has been validated by [facility] for patients greater than or equal to 18 year old. It is not intended to diagnose infection nor to guide or monitor treatment.      Scheduled Meds: . atorvastatin  10 mg Oral Daily  . carvedilol  12.5 mg Oral BID WC  . enoxaparin (LOVENOX) injection  40 mg Subcutaneous Q24H  . hydrALAZINE  25 mg Oral 3 times per day  . insulin aspart  0-15 Units Subcutaneous 6 times per day  .  insulin glargine  5 Units Subcutaneous QHS  . nicotine  21 mg Transdermal Daily  . piperacillin-tazobactam (ZOSYN)  IV  3.375 g Intravenous Q8H  . vancomycin  1,250 mg Intravenous Q24H   Continuous Infusions: . sodium chloride       Arilynn Blakeney, MD  [facility] Pager [phone]  Time spent: 35 minutes   If 7PM-7AM, please contact night-coverage [email or web address] Password [email or web address] 11/29/2014, 10:08 AM   LOS: 3 days

## 2014-11-29 NOTE — Progress Notes (Signed)
ANTIBIOTIC CONSULT NOTE - FOLLOW UP  Pharmacy Consult:  Vancomycin and Cefazolin Indication:  Right foot osteomyelitis  No Known Allergies  Patient Measurements: Height: 5\' 4"  (162.6 cm) Weight: 188 lb (85.276 kg) IBW/kg (Calculated) : 54.7  Vital Signs: Temp: 98.2 F (36.8 C) (08/24 0426) Temp Source: Oral (08/24 0426) BP: 135/64 mmHg (08/24 0426) Pulse Rate: 80 (08/24 0426) Intake/Output from previous day: 08/23 0701 - 08/24 0700 In: 1410 [P.O.:480; I.V.:780; IV Piggyback:150] Out: 25 [Blood:25]  Labs:  Recent Labs  11/27/14 0430 11/28/14 0521 11/28/14 1851 11/29/14 0411  WBC 10.3 10.5  --  13.2*  HGB 9.8* 9.8*  --  9.5*  PLT 294 285  --  295  CREATININE 0.62 1.43* 1.60* 1.80*   Estimated Creatinine Clearance: 36 mL/min (by C-G formula based on Cr of 1.8).  Recent Labs  11/28/14 1851  VANCORANDOM 10     Microbiology: Recent Results (from the past 720 hour(s))  Wound culture     Status: None (Preliminary result)   Collection Time: 11/26/14 11:11 AM  Result Value Ref Range Status   Specimen Description WOUND RIGHT FOOT  Final   Special Requests Immunocompromised  Final   Gram Stain   Final    RARE WBC PRESENT, PREDOMINANTLY PMN NO SQUAMOUS EPITHELIAL CELLS SEEN MODERATE GRAM POSITIVE COCCI IN PAIRS Performed at [facility]    Culture   Final    MODERATE STAPHYLOCOCCUS AUREUS Performed at [facility]    Report Status PENDING  Incomplete  Surgical pcr screen     Status: None   Collection Time: 11/28/14  8:43 AM  Result Value Ref Range Status   MRSA, PCR NEGATIVE NEGATIVE Final   Staphylococcus aureus NEGATIVE NEGATIVE Final    Comment:        The Xpert SA Assay (FDA approved for NASAL specimens in patients over 24 years of age), is one component of a comprehensive surveillance program.  Test performance has been validated by [facility] for patients greater than or equal to 72 year old. It is not intended to diagnose  infection nor to guide or monitor treatment.   Anaerobic culture     Status: None (Preliminary result)   Collection Time: 11/28/14  4:01 PM  Result Value Ref Range Status   Specimen Description WOUND RIGHT TOE  Final   Special Requests NONE  Final   Gram Stain   Final    RARE WBC PRESENT, PREDOMINANTLY PMN NO SQUAMOUS EPITHELIAL CELLS SEEN MODERATE GRAM POSITIVE COCCI IN PAIRS Performed at [facility]    Culture   Final    NO ANAEROBES ISOLATED; CULTURE IN PROGRESS FOR 5 DAYS Performed at [facility]    Report Status PENDING  Incomplete  Fungus Culture with Smear     Status: None (Preliminary result)   Collection Time: 11/28/14  4:01 PM  Result Value Ref Range Status   Specimen Description WOUND RIGHT TOE  Final   Special Requests NONE  Final   Fungal Smear   Final    NO YEAST OR FUNGAL ELEMENTS SEEN Performed at [facility]    Culture   Final    CULTURE IN PROGRESS FOR FOUR WEEKS Performed at [facility]    Report Status PENDING  Incomplete  Wound culture     Status: None (Preliminary result)   Collection Time: 11/28/14  4:01 PM  Result Value Ref Range Status   Specimen Description WOUND RIGHT TOE  Final   Special  Requests NONE  Final   Gram Stain   Final    RARE WBC PRESENT, PREDOMINANTLY PMN NO SQUAMOUS EPITHELIAL CELLS SEEN MODERATE GRAM POSITIVE COCCI IN PAIRS Performed at [facility]    Culture PENDING  Incomplete   Report Status PENDING  Incomplete      Assessment: 59 YOF with right foot osteomyelitis s/p right great toe amputation 8/23.  Culture showing staph aureus - to change Zosyn to Ancef.    Vanc 8/21 >> Zosyn 8/21 >> 8/24 Ancef 8/24 >>    Goal of Therapy:  Vancomycin trough level 15-20 mcg/ml   Plan:  - Continue Vanc  IV Q24H - Ancef 2gm IV q8h - Monitor renal fxn closely and check vanc level as indicated - Follow-up cx data and narrow therapy  [employer], Pharm.D.,  BCPS Clinical Pharmacist Pager [phone] 11/29/2014 12:55 PM

## 2014-11-29 NOTE — Evaluation (Signed)
Physical Therapy Evaluation Patient Details Name: Shirley Chen MRN: [id number] DOB: [birth year] Today's Date: 11/29/2014   History of Present Illness  Patient is a 59 y/o female presents with osteomyelitis right great toe s/p great toe amputation. PMH includes DM, PVD, tobacco use, and HTN. Pt recently d/ced from the hospital on 10/30/2014 after right femoral to anterior tibial bypass using the right greater saphenous vein.   Clinical Impression  Patient presents with pain and balance deficits s/p above surgery impacting mobility. Gait training performed with Darco shoe. Some balance deficits noted however anticipate quick improvement with practice. Tolerated stair training today however limited due to lines. Pt will have support from daughter at home. Helps take care of 96 y/o grand daughter. Encouraged OOB as much as tolerated. Will plan to instruct pt on how to donn/doff Darco shoe and perform stair training tomorrow prior to d/c. Will follow acutely.     Follow Up Recommendations Supervision for mobility/OOB;No PT follow up    Equipment Recommendations  Rolling walker with 5" wheels    Recommendations for Other Services       Precautions / Restrictions Precautions Precautions: Fall Required Braces or Orthoses: Other Brace/Splint Other Brace/Splint: Darco shoe. Restrictions Weight Bearing Restrictions: Yes RLE Weight Bearing: Partial weight bearing RLE Partial Weight Bearing Percentage or Pounds: thru heel only in Darco shoe      Mobility  Bed Mobility               General bed mobility comments: Sitting EOB upon PT arrival.   Transfers Overall transfer level: Needs assistance Equipment used: Rolling walker (2 wheeled) Transfers: Sit to/from Stand Sit to Stand: Supervision         General transfer comment: Supervision for safety.   Ambulation/Gait Ambulation/Gait assistance: Min guard Ambulation Distance (Feet): 100 Feet (x2 bouts) Assistive device: Rolling  walker (2 wheeled) Gait Pattern/deviations: Step-to pattern;Step-through pattern;Decreased stride length;Decreased stance time - right;Decreased step length - left;Trunk flexed   Gait velocity interpretation: <1.8 ft/sec, indicative of risk for recurrent falls General Gait Details: Pt with slow, mildly unsteady gait. Cues for WB through heel RLE. Adjusting to using Darco shoe for ambulation. 1 seated rest break.  Stairs Stairs: Yes Stairs assistance: Min guard Stair Management: Step to pattern;Sideways;One rail Right Number of Stairs: 3 General stair comments: Cues for sequencing/technique.   Wheelchair Mobility    Modified Rankin (Stroke Patients Only)       Balance Overall balance assessment: Needs assistance Sitting-balance support: Feet supported;No upper extremity supported Sitting balance-Leahy Scale: Good Sitting balance - Comments: Able to donn sock sitting EOB without difficulty.    Standing balance support: During functional activity Standing balance-Leahy Scale: Fair Standing balance comment: Recommend use of RW for support, able to take a few steps without RW however unsteady.                             Pertinent Vitals/Pain Pain Assessment: Faces Faces Pain Scale: Hurts little more Pain Location: right foot Pain Descriptors / Indicators: Sore;Throbbing Pain Intervention(s): Monitored during session;Repositioned    Home Living Family/patient expects to be discharged to:: Private residence Living Arrangements: Children Available Help at Discharge: Family;Available 24 hours/day Type of Home: Apartment Home Access: Stairs to enter Entrance Stairs-Rails: Right Entrance Stairs-Number of Steps: 14 + 14 steps Home Layout: One level Home Equipment: Cane - single point      Prior Function Level of Independence: Independent with assistive device(s)  Comments: Pt using SPC intermittently PTA.     Hand Dominance   Dominant Hand:  Right    Extremity/Trunk Assessment   Upper Extremity Assessment: Defer to OT evaluation           Lower Extremity Assessment: Overall WFL for tasks assessed (Ankle AROM not assessed secondary to surgery/pain.)      Cervical / Trunk Assessment: Normal  Communication   Communication: No difficulties  Cognition Arousal/Alertness: Awake/alert Behavior During Therapy: WFL for tasks assessed/performed Overall Cognitive Status: Within Functional Limits for tasks assessed                      General Comments      Exercises        Assessment/Plan    PT Assessment Patient needs continued PT services  PT Diagnosis Difficulty walking;Acute pain   PT Problem List Pain;Decreased activity tolerance;Decreased balance;Decreased mobility;Decreased knowledge of use of DME  PT Treatment Interventions Balance training;Gait training;Functional mobility training;Therapeutic activities;Therapeutic exercise;Patient/family education;Stair training;DME instruction   PT Goals (Current goals can be found in the Care Plan section) Acute Rehab PT Goals Patient Stated Goal: to be able to do laundry, dishes etc PT Goal Formulation: With patient Time For Goal Achievement: 12/13/14 Potential to Achieve Goals: Good    Frequency Min 3X/week   Barriers to discharge Inaccessible home environment 24 steps to get to apt    Co-evaluation               End of Session Equipment Utilized During Treatment: Gait belt;Other (comment) (Darco shoe) Activity Tolerance: Patient tolerated treatment well Patient left: in chair;with call bell/phone within reach Nurse Communication: Mobility status;Weight bearing status         Time: 1351-1411 PT Time Calculation (min) (ACUTE ONLY): 20 min   Charges:   PT Evaluation $Initial PT Evaluation Tier I: 1 Procedure     PT G Codes:        Jerome Viglione A Chavez Rosol 11/29/2014, 2:27 PM Mylo Red, PT, DPT [phone]

## 2014-11-29 NOTE — Progress Notes (Signed)
PT Cancellation Note  Patient Details Name: Shirley Chen MRN: [id number] DOB: [birth year]   Cancelled Treatment:    Reason Eval/Treat Not Completed: Other (comment) Awaiting delivery of darco shoe prior to initiation of PT evaluation.   [name] 11/29/2014, 11:56 AM Mylo Red, PT, DPT [phone]

## 2014-11-29 NOTE — Progress Notes (Signed)
   VASCULAR SURGERY ASSESSMENT & PLAN:  * 1 Day Post-Op s/p: Ray amputation of right great toe  *  Begin dressing changes  * PTx consult to teach her how to ambulate with the Darco shoe.  SUBJECTIVE: Pain well controlled  PHYSICAL EXAM: Filed Vitals:   11/28/14 1706 11/28/14 1712 11/28/14 2139 11/29/14 0426  BP:  160/61 164/70 135/64  Pulse: 78 77 95 80  Temp: 97.7 F (36.5 C)  98.1 F (36.7 C) 98.2 F (36.8 C)  TempSrc:   Oral Oral  Resp: Height:      Weight:      SpO2: 97% 97% 97% 93%   Dressing changed.  No significant drainage. Cellulitis unchanged.  LABS: Lab Results  Component Value Date   WBC 13.2* 11/29/2014   HGB 9.5* 11/29/2014   HCT 29.5* 11/29/2014   MCV 89.7 11/29/2014   PLT 295 11/29/2014   Lab Results  Component Value Date   CREATININE 1.80* 11/29/2014   Lab Results  Component Value Date   INR 1.07 10/20/2014   CBG (last 3)   Recent Labs  11/28/14 2141 11/29/14 0056 11/29/14 0427  GLUCAP 159* 183* 183*    Principal Problem:   Necrotic toes Active Problems:   Tobacco abuse   Atherosclerosis of native arteries of the extremities with ulceration   PAD (peripheral artery disease)   Osteomyelitis   HTN (hypertension)   Diabetes mellitus with peripheral vascular disease   Acute osteomyelitis of foot   Type 2 diabetes mellitus with right diabetic foot infection   Diabetic foot infection   Cari Caraway Beeper: [phone] 11/29/2014

## 2014-11-29 NOTE — Progress Notes (Signed)
Orthopedic Tech Progress Note Patient Details:  Shirley Chen Jan 19, 1956 [id number]  Patient ID: Shirley Chen, female   DOB: Mar 22, 1956, 59 y.o.   MRN: [id number] Viewed order from doctor's order list  [name] 11/29/2014, 12:20 PM

## 2014-11-30 LAB — MAGNESIUM: Magnesium: 2 mg/dL (ref 1.7–2.4)

## 2014-11-30 LAB — BASIC METABOLIC PANEL
Anion gap: 7 (ref 5–15)
BUN: 15 mg/dL (ref 6–20)
CO2: 25 mmol/L (ref 22–32)
Calcium: 8.5 mg/dL — ABNORMAL LOW (ref 8.9–10.3)
Chloride: 108 mmol/L (ref 101–111)
Creatinine, Ser: 1.74 mg/dL — ABNORMAL HIGH (ref 0.44–1.00)
GFR calc Af Amer: 36 mL/min — ABNORMAL LOW (ref >60)
GFR calc non Af Amer: 31 mL/min — ABNORMAL LOW (ref >60)
Glucose, Bld: 145 mg/dL — ABNORMAL HIGH (ref 65–99)
Potassium: 4.1 mmol/L (ref 3.5–5.1)
Sodium: 140 mmol/L (ref 135–145)

## 2014-11-30 LAB — GLUCOSE, CAPILLARY
Glucose-Capillary: 129 mg/dL — ABNORMAL HIGH (ref 65–99)
Glucose-Capillary: 132 mg/dL — ABNORMAL HIGH (ref 65–99)
Glucose-Capillary: 135 mg/dL — ABNORMAL HIGH (ref 65–99)
Glucose-Capillary: 139 mg/dL — ABNORMAL HIGH (ref 65–99)
Glucose-Capillary: 142 mg/dL — ABNORMAL HIGH (ref 65–99)
Glucose-Capillary: 183 mg/dL — ABNORMAL HIGH (ref 65–99)

## 2014-11-30 LAB — CBC
HCT: 30.7 % — ABNORMAL LOW (ref 36.0–46.0)
Hemoglobin: 9.8 g/dL — ABNORMAL LOW (ref 12.0–15.0)
MCH: 29.2 pg (ref 26.0–34.0)
MCHC: 31.9 g/dL (ref 30.0–36.0)
MCV: 91.4 fL (ref 78.0–100.0)
Platelets: 302 10*3/uL (ref 150–400)
RBC: 3.36 MIL/uL — ABNORMAL LOW (ref 3.87–5.11)
RDW: 14.2 % (ref 11.5–15.5)
WBC: 13.4 10*3/uL — ABNORMAL HIGH (ref 4.0–10.5)

## 2014-11-30 LAB — TROPONIN I
Troponin I: 0.04 ng/mL — ABNORMAL HIGH (ref <0.031)
Troponin I: 0.05 ng/mL — ABNORMAL HIGH (ref <0.031)
Troponin I: 0.05 ng/mL — ABNORMAL HIGH (ref <0.031)

## 2014-11-30 LAB — WOUND CULTURE

## 2014-11-30 MED ORDER — GI COCKTAIL ~~LOC~~
30.0000 mL | Freq: Three times a day (TID) | ORAL | Status: DC | PRN
  Filled 2014-11-30: qty 30

## 2014-11-30 NOTE — Progress Notes (Signed)
   VASCULAR SURGERY ASSESSMENT & PLAN:  * 2 Days Post-Op s/p: Ray amputation of right great toe.  *  Continue ABx  * She can do the dressing changes when she is discharged: Soak foot daily in luke warm Dial soap soaks. Hydrogel to open area, then moist 2X2 (NS), cover with dry 4X4's, kerlix and 4 inch ace.   SUBJECTIVE: Pain well controlled.   PHYSICAL EXAM: Filed Vitals:   11/29/14 1340 11/29/14 2057 11/30/14 0344 11/30/14 0519  BP: 130/52 130/55 181/82 176/76  Pulse: 88 90 96 92  Temp: 99.4 F (37.4 C) 98.1 F (36.7 C) 98.9 F (37.2 C) 99.7 F (37.6 C)  TempSrc: Oral Oral Oral Oral  Resp: Height:      Weight:      SpO2: 94% 93% 96%    Still with cellulitis of right foot. No significant drainage. Palpable graft pulse in right fem-ant tibial artery bypass.   LABS: Lab Results  Component Value Date   WBC 13.4* 11/30/2014   HGB 9.8* 11/30/2014   HCT 30.7* 11/30/2014   MCV 91.4 11/30/2014   PLT 302 11/30/2014   Lab Results  Component Value Date   CREATININE 1.74* 11/30/2014   Lab Results  Component Value Date   INR 1.07 10/20/2014   CBG (last 3)   Recent Labs  11/30/14 0040 11/30/14 0339 11/30/14 0930  GLUCAP 132* 135* 183*   MICRO: Gram stain: moderate staph aureus (On Vanco and Ancef currently)  Principal Problem:   Necrotic toes Active Problems:   Tobacco abuse   Atherosclerosis of native arteries of the extremities with ulceration   PAD (peripheral artery disease)   Osteomyelitis   HTN (hypertension)   Diabetes mellitus with peripheral vascular disease   Acute osteomyelitis of foot   Type 2 diabetes mellitus with right diabetic foot infection   Diabetic foot infection   ARF (acute renal failure)   Hypokalemia   Cari Caraway Beeper: [phone] 11/30/2014

## 2014-11-30 NOTE — Progress Notes (Addendum)
Patient called out to see this RN. Patient stated that "she needed some juice because she thinks her sugar is low". Patient was given orange juice but was unable to drink it because she was having nausea and "her stomach hurt". Upon further assessment, patient stated that " her chest hurt (midsternal area), and she was having shortness of breath with nausea. Patient's CBG was taken and was 135. Vitals taken, b/p 181/82, remaining vitals WNL. Patient placed on O2 at 2L, given zofran. EKG was done (NSR) even though the patient stated that "she couldn't lay down because she was going to get sick". Called rapid response nurse to assess patient. Patient was aggravated and stated "she was feeling better and wanted to be left alone". paged on call for [facility], [facility]) to let her know of the situation. Troponin was ordered to rule out cardiac. Patient's b/p was retaken at 0515, 176/76. Patient given oral scheduled hydralazine per Schorr. Nursing will continue to monitor patient.   Patient sitting upright in chair and states that "she is feeling better".

## 2014-11-30 NOTE — Progress Notes (Signed)
Physical Therapy Treatment Patient Details Name: Shirley Chen MRN: [id number] DOB: 1955-08-22 Today's Date: 11/30/2014    History of Present Illness Patient is a 59 y/o female presents with osteomyelitis right great toe s/p great toe amputation. PMH includes DM, PVD, tobacco use, and HTN. Pt recently d/ced from the hospital on 10/30/2014 after right femoral to anterior tibial bypass using the right greater saphenous vein.     PT Comments    Patient progressing well towards PT goals. Performed stair training and gait training this session. No chest pain. Pt has support from daughter at home. Safe to discharge from a mobility stand point. Met all goals except requires Min guard assist for stair negotiation and supervision for ambulation. All education completed. Discharge from therapy.   Follow Up Recommendations  Supervision for mobility/OOB;No PT follow up     Equipment Recommendations  Rolling walker with 5" wheels    Recommendations for Other Services       Precautions / Restrictions Precautions Precautions: Fall Required Braces or Orthoses: Other Brace/Splint Other Brace/Splint: Darco shoe. Restrictions Weight Bearing Restrictions: Yes RLE Weight Bearing: Partial weight bearing RLE Partial Weight Bearing Percentage or Pounds: thru heel in darco boot Other Position/Activity Restrictions: elevated troponins. Per Dr. Grandville Silos, safe to work with therapy today.     Mobility  Bed Mobility               General bed mobility comments: Sitting in recliner upon PT arrival.   Transfers Overall transfer level: Needs assistance Equipment used: Rolling walker (2 wheeled) Transfers: Sit to/from Stand Sit to Stand: Modified independent (Device/Increase time)         General transfer comment: Mod I to stand from recliner, from bench x1.   Ambulation/Gait Ambulation/Gait assistance: Supervision Ambulation Distance (Feet): 115 Feet (+100') Assistive device: Rolling  walker (2 wheeled) Gait Pattern/deviations: Step-to pattern;Decreased stance time - right;Decreased step length - left;Trunk flexed;Decreased stride length   Gait velocity interpretation: <1.8 ft/sec, indicative of risk for recurrent falls General Gait Details: Pt with steady gait. Good WB thru heel. 1 seated rest break. Dyspnea 1/4. HR and Sa02 stable.    Stairs Stairs: Yes Stairs assistance: Min guard Stair Management: Step to pattern;Sideways Number of Stairs: 13 General stair comments: Cues for sequencing/technique. Rest break at top of steps. Dyspnea present, no chest pain. HR stable.  Wheelchair Mobility    Modified Rankin (Stroke Patients Only)       Balance Overall balance assessment: Needs assistance Sitting-balance support: Feet supported;No upper extremity supported Sitting balance-Leahy Scale: Good     Standing balance support: During functional activity Standing balance-Leahy Scale: Fair                      Cognition Arousal/Alertness: Awake/alert Behavior During Therapy: WFL for tasks assessed/performed Overall Cognitive Status: Within Functional Limits for tasks assessed                      Exercises      General Comments        Pertinent Vitals/Pain Pain Assessment: No/denies pain Pain Score: 0-No pain    Home Living                      Prior Function            PT Goals (current goals can now be found in the care plan section) Progress towards PT goals: Progressing toward goals    Frequency  Min 3X/week    PT Plan Current plan remains appropriate    Co-evaluation             End of Session Equipment Utilized During Treatment:  (Darco shoe) Activity Tolerance: Patient tolerated treatment well Patient left: in chair;with call bell/phone within reach     Time: 1224-8250 PT Time Calculation (min) (ACUTE ONLY): 16 min  Charges:  $Gait Training: 8-22 mins                    G Codes:      Rachel 11/30/2014, 3:59 PM  Wray Kearns, [place], DPT [phone]

## 2014-11-30 NOTE — Progress Notes (Signed)
Occupational Therapy Treatment Patient Details Name: Favor Kreh MRN: [id number] DOB: Jan 14, 1956 Today's Date: 11/30/2014    History of present illness Patient is a 59 y/o female presents with osteomyelitis right great toe s/p great toe amputation. PMH includes DM, PVD, tobacco use, and HTN. Pt recently d/ced from the hospital on 10/30/2014 after right femoral to anterior tibial bypass using the right greater saphenous vein.    OT comments  Patient progressing nice towards OT goals, continue plan of care for now. Pt practiced tub/shower transfer (stepping over tub), pt supervision.    Follow Up Recommendations  No OT follow up;Supervision - Intermittent  Equipment Recommendations  3 in 1 bedside comode    Recommendations for Other Services  None at this time   Precautions / Restrictions Precautions Precautions: Fall Required Braces or Orthoses: Other Brace/Splint Other Brace/Splint: Darco shoe. Restrictions Weight Bearing Restrictions: No RLE Weight Bearing: Partial weight bearing RLE Partial Weight Bearing Percentage or Pounds: thru heel in darco boot    Mobility Bed Mobility General bed mobility comments: Pt found seated in recliner upon OT entering/exiting room  Transfers Overall transfer level: Needs assistance Equipment used: Rolling walker (2 wheeled) Transfers: Sit to/from Stand Sit to Stand: Supervision         General transfer comment: Supervision for safety.     Balance Overall balance assessment: Needs assistance Sitting-balance support: No upper extremity supported;Feet supported Sitting balance-Leahy Scale: Good     Standing balance support: Bilateral upper extremity supported;During functional activity Standing balance-Leahy Scale: Fair   ADL Overall ADL's : Needs assistance/impaired General ADL Comments: Pt overall supervision. Pt able to don/doff darco boot independently. Therapist assisted pt to therapy gym for tub/shower transfer (stepping over  tub backwards -left leg first). Pt refusing 3-in-1 secondary to insurance not covering. Pt will be standing to shower, unless she has an appropriate seat at home. Explained that she will need to wear darco boot and cover for shower if standing.      Cognition   Behavior During Therapy: WFL for tasks assessed/performed Overall Cognitive Status: Within Functional Limits for tasks assessed                 Pertinent Vitals/ Pain       Pain Assessment: No/denies pain Pain Score: 0-No pain   Frequency Min 2X/week     Progress Toward Goals  OT Goals(current goals can now befound in the care plan section)  Progress towards OT goals: Progressing toward goals     Plan Discharge plan remains appropriate    End of Session Equipment Utilized During Treatment: Rolling walker;Other (comment) (darco boot > RLE)   Activity Tolerance Patient tolerated treatment well   Patient Left in chair;with call bell/phone within reach  Nurse Communication          Time: 4010-2725 OT Time Calculation (min): 21 min  Charges: OT General Charges $OT Visit: 1 Procedure OT Treatments $Self Care/Home Management : 8-22 mins  Nathin Saran , MS, OTR/L, CLT Pager: [phone]  11/30/2014, 12:53 PM

## 2014-11-30 NOTE — Progress Notes (Signed)
PROGRESS NOTE  Shirley Chen UXN:[id number] DOB: 07/02/1955 DOA: 11/26/2014 PCP: [facility]   Brief history 59 year old female with history of diabetes mellitus, peripheral vascular disease, continued tobacco use, and hypertension presented with new wounds on her right hallux and lateral hee as well as purulent drainage on the plantar aspect of her toe. l. The patient was recently discharged from the hospital on 10/30/2014 after right femoral to anterior tibial bypass using the right greater saphenous vein. During that admission, the patient was noted to have a diabetic foot infection and was started on intravenous vancomycin and Zosyn. After angiography was performed on 10/23/2014, that showed Infrainguinal arterial occlusive disease. She was subsequently taken to the operating room on 10/27/2014 where Dr. Scot Dock performed a Right common femoral artery to anterior tibial artery bypass with non-reversed translocated saphenous vein graft. MRI of her foot on 10/21/2014 revealed soft tissue edema without enhancement, abscess, or frank osteomyelitis, but did show some marrow edema on the right first distal phalanx. The patient was ultimately discharged home with trimethoprim sulfamethoxazole. Assessment/Plan: Acute osteomyelitis of the right foot/diabetic foot infection in the setting of peripheral vascular disease Patient status post ray amputation of right great toe 11/28/2014 per Dr. Scot Dock. Wound cultures obtained 11/26/2014 with MSSA and gp B strep agalactiae. Prelim Intraoperative cultures obtained 11/28/2014 with moderate staph. ESR was 68. CRP 4.6. -repeat MRI foot--acute osteomyelitis distal and proximal phalanges of the hallux, septic arthritis of first inter-phalangeal joint, myositis of plantar musculature -can go home on oral abx pending culture results. Continue empiric IV vancomycin and IV Ancef. -appreciate Dr. Scot Dock Acute renal failure -??  Etiology. Patient is not on any nephrotoxic medications at this time. MRI was done of the foot with and without contrast on 11/28/2014. Vancomycin trough levels were subtherapeutic at 10 on 11/28/2014. May be secondary to a prerenal azotemia. FENA = 0.66. Renal US negative for hydronephoresis. Continue IVF. Peripheral vascular disease -10/27/2014--Dr. Scot Dock performed Right common femoral artery to anterior tibial artery bypass with non-reversed translocated saphenous vein graft. Per vascular surgery. Diabetes mellitus type 2, poorly controlled -10/21/2014 hemoglobin A1c 12.9 -pt was only taking 75/25 once daily at home (instead of BID) -she would like to switch to once daily insulin - CBGs have ranged from 139-183. -Continue current regimen of Lantus 5 units daily at bedtime and sliding scale insulin. Outpatient follow-up. Hypertension -Continue current regimen of carvedilol and hydralazine Hyperlipidemia -Continue Lipitor Tobacco abuse -40-pack-year history -pt has quit Hypokalemia -Repleted -Check magnesium   Antibiotics IV Vancomycin 11/28/2014 IV Zosyn 11/26/14>>>11/29/14 IV Ancef 11/29/2014   Family Communication: Updated patient. No family at beside Disposition Plan: Home when renal function improves and trending down in 1-2 days   Procedures/Studies: US Renal  11/29/2014   CLINICAL DATA:  Acute renal failure  EXAM: RENAL / URINARY TRACT ULTRASOUND COMPLETE  COMPARISON:  None  FINDINGS: Right Kidney:  Length: 11.7 cm. Echogenicity within normal limits. No mass or hydronephrosis visualized.  Left Kidney:  Length: 11.2 cm. Echogenicity within normal limits. No mass or hydronephrosis visualized.  Bladder:  Echogenic material within the lumen of the gallbladder.  IMPRESSION: 1. Normal kidneys.  No hydronephrosis. 2. Echogenic material within the bladder could represent lesion, clot or debris.   Electronically Signed   By: Suzy Bouchard M.D.   On: 11/29/2014 20:03   Mr Foot  Right W Wo Contrast  11/28/2014   CLINICAL DATA:  Nonhealing ulcer of the right  great toe with drainage. Ulcer along the right medial heel.  EXAM: MRI OF THE RIGHT FOOT WITHOUT AND WITH CONTRAST  TECHNIQUE: Multiplanar, multisequence MR imaging was performed both before and after administration of intravenous contrast. Osteomyelitis protocol MRI of the foot was obtained, to include the entire foot and ankle. This protocol uses a large field of view to cover the entire foot and ankle, and is suitable for assessing bony structures for osteomyelitis. Due to the large field of view and imaging plane choice, this protocol is less sensitive for assessing small structures such as ligamentous structures of the foot and ankle, compared to a dedicated forefoot or dedicated hindfoot exam.  CONTRAST:  42m MULTIHANCE GADOBENATE DIMEGLUMINE 529 MG/ML IV SOLN  COMPARISON:  Multiple exams, including 11/26/2014 and 10/21/2014  FINDINGS: Abnormal edema and enhancement in the proximal and distal phalanges of the great toe, with extensive surrounding subcutaneous/soft tissue enhancement. Ulcerations of the great toe are present, especially medially, and the possibility of septic arthritis of the interphalangeal joint of the great toe is raised.  Low-level edema medially in the anterior talar dome and adjacent talar neck, likely attributable to degenerative arthropathy at the tibiotalar joint and possible underlying non-fragmented osteochondral lesion of the medial talar dome. I am doubtful of osteomyelitis in this vicinity.  Lisfranc ligament intact. No compelling findings of osteomyelitis in the hindfoot, with the understanding that part of the hindfoot is obscured by the metal artifact. Is  Plantar and Achilles calcaneal spurs. Dorsal midfoot spurring. Low-level increased T2 signal along the medial band of the plantar fascia proximally, potentially from plantar fasciitis.  Edema and infiltrative enhancement tracks along the plantar  musculature of the foot, and also in the dorsal subcutaneous tissues of the foot, suggesting cellulitis and myositis.  IMPRESSION: 1. Active osteomyelitis of the proximal and distal phalanges of the great toe, with gas in the soft tissues medially, ulceration especially medially, and concern for septic arthritis of the interphalangeal joint. 2. Myositis of the plantar musculature of the foot with dorsal cellulitis in the forefoot. 3. Non-fragmented osteochondral lesion of the medial talar dome. No definite hindfoot osteomyelitis. 4. Plantar fasciitis. 5. Distal tibial and fibular deformity due to prior fracture. A lag screw in the distal tibia creates some artifact which obscures surrounding structures. 6. Degenerative arthropathy at the tibiotalar articulation.   Electronically Signed   By: WVan ClinesM.D.   On: 11/28/2014 08:22   Dg Toe Great Right  11/26/2014   CLINICAL DATA:  Diabetic with infection of the right great toe.  EXAM: RIGHT GREAT TOE - 3 VIEWS  COMPARISON:  Right foot x-ray 10/20/2014.  FINDINGS: Severe soft tissue swelling involving the great toe. Loss of the cortical margin of the proximal aspect of the distal phalanx, new since the examination 5 weeks ago. Bone fragments arising from the head of the proximal phalanx, also new. No evidence of pathologic fracture. Mild narrowing of the IP joint space.  IMPRESSION: Osteomyelitis involving the head of the proximal phalanx and the base of the distal phalanx with IP joint infection also suspected.   Electronically Signed   By: TEvangeline DakinM.D.   On: 11/26/2014 12:37   status post ray amputation of right great toe per Dr. DScot Dock08/23/2016      Subjective: Patient sitting in the dark. Patient states pain is controlled. Patient states has good urinary output. No chest pain. No shortness of breath.    Objective: Filed Vitals:   11/29/14 1340 11/29/14 2057 11/30/14  0344 11/30/14 0519  BP: 130/52 130/55 181/82 176/76  Pulse:  88 90 96 92  Temp: 99.4 F (37.4 C) 98.1 F (36.7 C) 98.9 F (37.2 C) 99.7 F (37.6 C)  TempSrc: Oral Oral Oral Oral  Resp: _0 Height:      Weight:      SpO2: 94% 93% 96%     Intake/Output Summary (Last 24 hours) at 11/30/14 1156 Last data filed at 11/30/14 0520  Gross per 24 hour  Intake    960 ml  Output      0 ml  Net    960 ml   Weight change:  Exam:   General:  Pt is alert, follows commands. NAD.  HEENT: No icterus, No thrush, No neck mass, [place]/AT  Cardiovascular: RRR, S1/S2, no rubs, no gallops  Respiratory: CTA bilaterally, no wheezing, no crackles, no rhonchi  Abdomen: Soft/+BS, non tender, non distended, no guarding; no hepatosplenomegaly   Extremities: No edema, No lymphangitis, No petechiae, No rashes, no synovitis. Foot in bandage.  Data Reviewed: Basic Metabolic Panel:  Recent Labs Lab 11/27/14 0430 11/27/14 1643 11/28/14 0521 11/28/14 1851 11/29/14 0411 11/30/14 0505  NA 140  --  142 142 139 140  K 2.8*  --  3.8 3.9 3.4* 4.1  CL 105  --  111 106 105 108  CO2 29  --  _1 GLUCOSE 171*  --  129* 122* 178* 145*  BUN 11  --  _2 CREATININE 0.62  --  1.43* 1.60* 1.80* 1.74*  CALCIUM 8.3*  --  8.5* 8.8* 8.2* 8.5*  MG  --  1.7  --   --  1.8 2.0   Liver Function Tests: No results for input(s): AST, ALT, ALKPHOS, BILITOT, PROT, ALBUMIN in the last 168 hours. No results for input(s): LIPASE, AMYLASE in the last 168 hours. No results for input(s): AMMONIA in the last 168 hours. CBC:  Recent Labs Lab 11/26/14 1128 11/27/14 0430 11/28/14 0521 11/29/14 0411 11/30/14 0505  WBC 8.7 10.3 10.5 13.2* 13.4*  NEUTROABS 5.8  --   --   --   --   HGB 11.3* 9.8* 9.8* 9.5* 9.8*  HCT 33.8* 30.7* 30.2* 29.5* 30.7*  MCV 88.5 89.0 89.1 89.7 91.4  PLT 328 294 285 295 302   Cardiac Enzymes:  Recent Labs Lab 11/30/14 0505 11/30/14 1046  TROPONINI 0.04* 0.05*   BNP: Invalid input(s): POCBNP CBG:  Recent Labs Lab  11/29/14 1606 11/29/14 2005 11/30/14 0040 11/30/14 0339 11/30/14 0930  GLUCAP 137* 158* 132* 135* 183*    Recent Results (from the past 240 hour(s))  Wound culture     Status: None   Collection Time: 11/26/14 11:11 AM  Result Value Ref Range Status   Specimen Description WOUND RIGHT FOOT  Final   Special Requests Immunocompromised  Final   Gram Stain   Final    RARE WBC PRESENT, PREDOMINANTLY PMN NO SQUAMOUS EPITHELIAL CELLS SEEN MODERATE GRAM POSITIVE COCCI IN PAIRS Performed at [facility]    Culture   Final    MODERATE STAPHYLOCOCCUS AUREUS Note: RIFAMPIN AND GENTAMICIN SHOULD NOT BE USED AS SINGLE DRUGS FOR TREATMENT OF STAPH INFECTIONS. ABUNDANT GROUP B STREP(S.AGALACTIAE)ISOLATED Note: TESTING AGAINST S. AGALACTIAE NOT ROUTINELY PERFORMED DUE TO PREDICTABILITY OF AMP/PEN/VAN SUSCEPTIBILITY. Performed at [facility]    Report Status 11/30/2014 FINAL  Final   Organism ID, Bacteria STAPHYLOCOCCUS AUREUS  Final  Susceptibility   Staphylococcus aureus - MIC*    CLINDAMYCIN <=0.25 SENSITIVE Sensitive     ERYTHROMYCIN <=0.25 SENSITIVE Sensitive     GENTAMICIN <=0.5 SENSITIVE Sensitive     LEVOFLOXACIN <=0.12 SENSITIVE Sensitive     OXACILLIN <=0.25 SENSITIVE Sensitive     RIFAMPIN <=0.5 SENSITIVE Sensitive     TRIMETH/SULFA <=10 SENSITIVE Sensitive     VANCOMYCIN <=0.5 SENSITIVE Sensitive     TETRACYCLINE <=1 SENSITIVE Sensitive     MOXIFLOXACIN <=0.25 SENSITIVE Sensitive     * MODERATE STAPHYLOCOCCUS AUREUS  Surgical pcr screen     Status: None   Collection Time: 11/28/14  8:43 AM  Result Value Ref Range Status   MRSA, PCR NEGATIVE NEGATIVE Final   Staphylococcus aureus NEGATIVE NEGATIVE Final    Comment:        The Xpert SA Assay (FDA approved for NASAL specimens in patients over 37 years of age), is one component of a comprehensive surveillance program.  Test performance has been validated by [facility] for patients greater than or  equal to 17 year old. It is not intended to diagnose infection nor to guide or monitor treatment.   Anaerobic culture     Status: None (Preliminary result)   Collection Time: 11/28/14  4:01 PM  Result Value Ref Range Status   Specimen Description WOUND RIGHT TOE  Final   Special Requests NONE  Final   Gram Stain   Final    RARE WBC PRESENT, PREDOMINANTLY PMN NO SQUAMOUS EPITHELIAL CELLS SEEN MODERATE GRAM POSITIVE COCCI IN PAIRS Performed at [facility]    Culture   Final    NO ANAEROBES ISOLATED; CULTURE IN PROGRESS FOR 5 DAYS Performed at [facility]    Report Status PENDING  Incomplete  Fungus Culture with Smear     Status: None (Preliminary result)   Collection Time: 11/28/14  4:01 PM  Result Value Ref Range Status   Specimen Description WOUND RIGHT TOE  Final   Special Requests NONE  Final   Fungal Smear   Final    NO YEAST OR FUNGAL ELEMENTS SEEN Performed at [facility]    Culture   Final    CULTURE IN PROGRESS FOR FOUR WEEKS Performed at [facility]    Report Status PENDING  Incomplete  Wound culture     Status: None (Preliminary result)   Collection Time: 11/28/14  4:01 PM  Result Value Ref Range Status   Specimen Description WOUND RIGHT TOE  Final   Special Requests NONE  Final   Gram Stain   Final    RARE WBC PRESENT, PREDOMINANTLY PMN NO SQUAMOUS EPITHELIAL CELLS SEEN MODERATE GRAM POSITIVE COCCI IN PAIRS Performed at [facility]    Culture   Final    MODERATE STAPHYLOCOCCUS AUREUS Performed at [facility]    Report Status PENDING  Incomplete  AFB culture with smear     Status: None (Preliminary result)   Collection Time: 11/28/14  4:01 PM  Result Value Ref Range Status   Specimen Description WOUND RIGHT TOE  Final   Special Requests NONE  Final   Acid Fast Smear   Final    NO ACID FAST BACILLI SEEN Performed at [facility]    Culture   Final    CULTURE WILL BE EXAMINED FOR 6 WEEKS  BEFORE ISSUING A FINAL REPORT Performed at [facility]    Report Status PENDING  Incomplete  Culture, Urine  Status: None (Preliminary result)   Collection Time: 11/29/14 12:31 PM  Result Value Ref Range Status   Specimen Description URINE, CLEAN CATCH  Final   Special Requests NONE  Final   Culture TOO YOUNG TO READ  Final   Report Status PENDING  Incomplete     Scheduled Meds: . atorvastatin  10 mg Oral Daily  . carvedilol  12.5 mg Oral BID WC  .  ceFAZolin (ANCEF) IV  2 g Intravenous 3 times per day  . enoxaparin (LOVENOX) injection  40 mg Subcutaneous Q24H  . hydrALAZINE  25 mg Oral 3 times per day  . insulin aspart  0-15 Units Subcutaneous 6 times per day  . insulin glargine  5 Units Subcutaneous QHS  . nicotine  21 mg Transdermal Daily  . vancomycin  1,250 mg Intravenous Q24H   Continuous Infusions: . sodium chloride       THOMPSON,DANIEL, MD  [facility] Pager [phone]  Time spent: 35 minutes   If 7PM-7AM, please contact night-coverage [email or web address] Password [email or web address] 11/30/2014, 11:56 AM   LOS: 4 days

## 2014-12-01 LAB — BASIC METABOLIC PANEL
Anion gap: 6 (ref 5–15)
BUN: 14 mg/dL (ref 6–20)
CO2: 26 mmol/L (ref 22–32)
Calcium: 8.3 mg/dL — ABNORMAL LOW (ref 8.9–10.3)
Chloride: 109 mmol/L (ref 101–111)
Creatinine, Ser: 1.55 mg/dL — ABNORMAL HIGH (ref 0.44–1.00)
GFR calc Af Amer: 42 mL/min — ABNORMAL LOW (ref >60)
GFR calc non Af Amer: 36 mL/min — ABNORMAL LOW (ref >60)
Glucose, Bld: 110 mg/dL — ABNORMAL HIGH (ref 65–99)
Potassium: 4.2 mmol/L (ref 3.5–5.1)
Sodium: 141 mmol/L (ref 135–145)

## 2014-12-01 LAB — CBC
HCT: 28.2 % — ABNORMAL LOW (ref 36.0–46.0)
Hemoglobin: 8.8 g/dL — ABNORMAL LOW (ref 12.0–15.0)
MCH: 28.6 pg (ref 26.0–34.0)
MCHC: 31.2 g/dL (ref 30.0–36.0)
MCV: 91.6 fL (ref 78.0–100.0)
Platelets: 281 10*3/uL (ref 150–400)
RBC: 3.08 MIL/uL — ABNORMAL LOW (ref 3.87–5.11)
RDW: 14.3 % (ref 11.5–15.5)
WBC: 10.2 10*3/uL (ref 4.0–10.5)

## 2014-12-01 LAB — URINE CULTURE: Culture: 30000

## 2014-12-01 LAB — GLUCOSE, CAPILLARY
Glucose-Capillary: 107 mg/dL — ABNORMAL HIGH (ref 65–99)
Glucose-Capillary: 98 mg/dL (ref 65–99)
Glucose-Capillary: 99 mg/dL (ref 65–99)
Glucose-Capillary: 98 mg/dL (ref 65–99)
Glucose-Capillary: 137 mg/dL — ABNORMAL HIGH (ref 65–99)
Glucose-Capillary: 135 mg/dL — ABNORMAL HIGH (ref 65–99)

## 2014-12-01 MED ORDER — MAGNESIUM CITRATE PO SOLN
1.0000 | Freq: Once | ORAL | Status: AC
  Administered 2014-12-01: 1 via ORAL
  Filled 2014-12-01: qty 296

## 2014-12-01 MED ORDER — POLYETHYLENE GLYCOL 3350 17 G PO PACK
17.0000 g | PACK | Freq: Every day | ORAL | Status: DC | PRN
  Filled 2014-12-01: qty 1

## 2014-12-01 MED ORDER — LORATADINE 10 MG PO TABS
10.0000 mg | ORAL_TABLET | Freq: Every day | ORAL | Status: DC
  Administered 2014-12-01 – 2014-12-03 (×3): 10 mg via ORAL
  Filled 2014-12-01 (×3): qty 1

## 2014-12-01 MED ORDER — DOCUSATE SODIUM 100 MG PO CAPS: 100.0000 mg | ORAL_CAPSULE | Freq: Two times a day (BID) | ORAL | Status: DC | PRN

## 2014-12-01 NOTE — Progress Notes (Signed)
PROGRESS NOTE  Shirley Chen RCV:[id number] DOB: 11-23-1955 DOA: 11/26/2014 PCP: [facility]   Brief history 59 year old female with history of diabetes mellitus, peripheral vascular disease, continued tobacco use, and hypertension presented with new wounds on her right hallux and lateral hee as well as purulent drainage on the plantar aspect of her toe. l. The patient was recently discharged from the hospital on 10/30/2014 after right femoral to anterior tibial bypass using the right greater saphenous vein. During that admission, the patient was noted to have a diabetic foot infection and was started on intravenous vancomycin and Zosyn. After angiography was performed on 10/23/2014, that showed Infrainguinal arterial occlusive disease. She was subsequently taken to the operating room on 10/27/2014 where Dr. Scot Dock performed a Right common femoral artery to anterior tibial artery bypass with non-reversed translocated saphenous vein graft. MRI of her foot on 10/21/2014 revealed soft tissue edema without enhancement, abscess, or frank osteomyelitis, but did show some marrow edema on the right first distal phalanx. The patient was ultimately discharged home with trimethoprim sulfamethoxazole. Assessment/Plan: Acute osteomyelitis of the right foot/diabetic foot infection in the setting of peripheral vascular disease Patient status post ray amputation of right great toe 11/28/2014 per Dr. Scot Dock. Wound cultures obtained 11/26/2014 with MSSA and gp B strep agalactiae. Prelim Intraoperative cultures obtained 11/28/2014 with moderate staph. ESR was 68. CRP 4.6. -repeat MRI foot--acute osteomyelitis distal and proximal phalanges of the hallux, septic arthritis of first inter-phalangeal joint, myositis of plantar musculature -can go home on oral abx pending culture results. Continue empiric IV Ancef. Will discontinue IV vancomycin. -appreciate Dr. Scot Dock Acute renal  failure -?? Etiology. Patient is not on any nephrotoxic medications at this time. MRI was done of the foot with and without contrast on 11/28/2014. Vancomycin trough levels were subtherapeutic at 10 on 11/28/2014. May be secondary to a prerenal azotemia. FENA = 0.66. Renal US negative for hydronephoresis. Renal function improved with hydration. Continue IVF. Peripheral vascular disease -10/27/2014--Dr. Scot Dock performed Right common femoral artery to anterior tibial artery bypass with non-reversed translocated saphenous vein graft. Per vascular surgery. Diabetes mellitus type 2, poorly controlled -10/21/2014 hemoglobin A1c 12.9 -pt was only taking 75/25 once daily at home (instead of BID) -she would like to switch to once daily insulin - CBGs have ranged from 98-137. -Continue current regimen of Lantus 5 units daily at bedtime and sliding scale insulin. Outpatient follow-up. Hypertension -Continue current regimen of carvedilol and hydralazine Hyperlipidemia -Continue Lipitor Tobacco abuse -40-pack-year history -pt has quit Hypokalemia -Repleted. Magnesium level was 2.   Antibiotics IV Vancomycin 11/28/2014>>>>> 12/01/2014 IV Zosyn 11/26/14>>>11/29/14 IV Ancef 11/29/2014   Family Communication: Updated patient. No family at beside Disposition Plan: Home when renal function improves and trending down in 1-2 days   Procedures/Studies: US Renal  11/29/2014   CLINICAL DATA:  Acute renal failure  EXAM: RENAL / URINARY TRACT ULTRASOUND COMPLETE  COMPARISON:  None  FINDINGS: Right Kidney:  Length: 11.7 cm. Echogenicity within normal limits. No mass or hydronephrosis visualized.  Left Kidney:  Length: 11.2 cm. Echogenicity within normal limits. No mass or hydronephrosis visualized.  Bladder:  Echogenic material within the lumen of the gallbladder.  IMPRESSION: 1. Normal kidneys.  No hydronephrosis. 2. Echogenic material within the bladder could represent lesion, clot or debris.    Electronically Signed   By: Suzy Bouchard M.D.   On: 11/29/2014 20:03   Mr Foot Right W Wo Contrast  11/28/2014  CLINICAL DATA:  Nonhealing ulcer of the right great toe with drainage. Ulcer along the right medial heel.  EXAM: MRI OF THE RIGHT FOOT WITHOUT AND WITH CONTRAST  TECHNIQUE: Multiplanar, multisequence MR imaging was performed both before and after administration of intravenous contrast. Osteomyelitis protocol MRI of the foot was obtained, to include the entire foot and ankle. This protocol uses a large field of view to cover the entire foot and ankle, and is suitable for assessing bony structures for osteomyelitis. Due to the large field of view and imaging plane choice, this protocol is less sensitive for assessing small structures such as ligamentous structures of the foot and ankle, compared to a dedicated forefoot or dedicated hindfoot exam.  CONTRAST:  48m MULTIHANCE GADOBENATE DIMEGLUMINE 529 MG/ML IV SOLN  COMPARISON:  Multiple exams, including 11/26/2014 and 10/21/2014  FINDINGS: Abnormal edema and enhancement in the proximal and distal phalanges of the great toe, with extensive surrounding subcutaneous/soft tissue enhancement. Ulcerations of the great toe are present, especially medially, and the possibility of septic arthritis of the interphalangeal joint of the great toe is raised.  Low-level edema medially in the anterior talar dome and adjacent talar neck, likely attributable to degenerative arthropathy at the tibiotalar joint and possible underlying non-fragmented osteochondral lesion of the medial talar dome. I am doubtful of osteomyelitis in this vicinity.  Lisfranc ligament intact. No compelling findings of osteomyelitis in the hindfoot, with the understanding that part of the hindfoot is obscured by the metal artifact. Is  Plantar and Achilles calcaneal spurs. Dorsal midfoot spurring. Low-level increased T2 signal along the medial band of the plantar fascia proximally, potentially  from plantar fasciitis.  Edema and infiltrative enhancement tracks along the plantar musculature of the foot, and also in the dorsal subcutaneous tissues of the foot, suggesting cellulitis and myositis.  IMPRESSION: 1. Active osteomyelitis of the proximal and distal phalanges of the great toe, with gas in the soft tissues medially, ulceration especially medially, and concern for septic arthritis of the interphalangeal joint. 2. Myositis of the plantar musculature of the foot with dorsal cellulitis in the forefoot. 3. Non-fragmented osteochondral lesion of the medial talar dome. No definite hindfoot osteomyelitis. 4. Plantar fasciitis. 5. Distal tibial and fibular deformity due to prior fracture. A lag screw in the distal tibia creates some artifact which obscures surrounding structures. 6. Degenerative arthropathy at the tibiotalar articulation.   Electronically Signed   By: WVan ClinesM.D.   On: 11/28/2014 08:22   Dg Toe Great Right  11/26/2014   CLINICAL DATA:  Diabetic with infection of the right great toe.  EXAM: RIGHT GREAT TOE - 3 VIEWS  COMPARISON:  Right foot x-ray 10/20/2014.  FINDINGS: Severe soft tissue swelling involving the great toe. Loss of the cortical margin of the proximal aspect of the distal phalanx, new since the examination 5 weeks ago. Bone fragments arising from the head of the proximal phalanx, also new. No evidence of pathologic fracture. Mild narrowing of the IP joint space.  IMPRESSION: Osteomyelitis involving the head of the proximal phalanx and the base of the distal phalanx with IP joint infection also suspected.   Electronically Signed   By: TEvangeline DakinM.D.   On: 11/26/2014 12:37   status post ray amputation of right great toe per Dr. DScot Dock08/23/2016      Subjective: Patient states pain is controlled. Patient states has good urine output. Patient denies any chest pain. No shortness of breath.   Objective: Filed Vitals:   11/30/14  4854 11/30/14 1544  11/30/14 2139 12/01/14 0615  BP: 176/76 126/81 142/65 167/82  Pulse: 92 77 73 73  Temp: 99.7 F (37.6 C) 97.9 F (36.6 C) 98.6 F (37 C) 98.2 F (36.8 C)  TempSrc: Oral Oral Oral Oral  Resp: '18 18 18 16  ' Height:      Weight:      SpO2:  97% 96% 93%    Intake/Output Summary (Last 24 hours) at 12/01/14 1238 Last data filed at 11/30/14 1300  Gross per 24 hour  Intake    260 ml  Output      0 ml  Net    260 ml   Weight change:  Exam:   General:  Pt is alert, follows commands. NAD.  HEENT: No icterus, No thrush, No neck mass, [place]/AT  Cardiovascular: RRR, S1/S2, no rubs, no gallops  Respiratory: CTA bilaterally, no wheezing, no crackles, no rhonchi  Abdomen: Soft/+BS, non tender, non distended, no guarding; no hepatosplenomegaly   Extremities: No edema, No lymphangitis, No petechiae, No rashes, no synovitis. Right Foot in bandage.  Data Reviewed: Basic Metabolic Panel:  Recent Labs Lab 11/27/14 1643 11/28/14 0521 11/28/14 1851 11/29/14 0411 11/30/14 0505 12/01/14 0415  NA  --  142 142 139 140 141  K  --  3.8 3.9 3.4* 4.1 4.2  CL  --  111 106 105 108 109  CO2  --  '24 28 26 25 26  ' GLUCOSE  --  129* 122* 178* 145* 110*  BUN  --  '11 12 14 15 14  ' CREATININE  --  1.43* 1.60* 1.80* 1.74* 1.55*  CALCIUM  --  8.5* 8.8* 8.2* 8.5* 8.3*  MG 1.7  --   --  1.8 2.0  --    Liver Function Tests: No results for input(s): AST, ALT, ALKPHOS, BILITOT, PROT, ALBUMIN in the last 168 hours. No results for input(s): LIPASE, AMYLASE in the last 168 hours. No results for input(s): AMMONIA in the last 168 hours. CBC:  Recent Labs Lab 11/26/14 1128 11/27/14 0430 11/28/14 0521 11/29/14 0411 11/30/14 0505 12/01/14 0415  WBC 8.7 10.3 10.5 13.2* 13.4* 10.2  NEUTROABS 5.8  --   --   --   --   --   HGB 11.3* 9.8* 9.8* 9.5* 9.8* 8.8*  HCT 33.8* 30.7* 30.2* 29.5* 30.7* 28.2*  MCV 88.5 89.0 89.1 89.7 91.4 91.6  PLT 328 294 285 295 302 281   Cardiac Enzymes:  Recent Labs Lab  11/30/14 0505 11/30/14 1046 11/30/14 1644  TROPONINI 0.04* 0.05* 0.05*   BNP: Invalid input(s): POCBNP CBG:  Recent Labs Lab 11/30/14 1625 11/30/14 2116 12/01/14 0123 12/01/14 0508 12/01/14 0913  GLUCAP 139* 129* 107* 98 99    Recent Results (from the past 240 hour(s))  Wound culture     Status: None   Collection Time: 11/26/14 11:11 AM  Result Value Ref Range Status   Specimen Description WOUND RIGHT FOOT  Final   Special Requests Immunocompromised  Final   Gram Stain   Final    RARE WBC PRESENT, PREDOMINANTLY PMN NO SQUAMOUS EPITHELIAL CELLS SEEN MODERATE GRAM POSITIVE COCCI IN PAIRS Performed at [facility]    Culture   Final    MODERATE STAPHYLOCOCCUS AUREUS Note: RIFAMPIN AND GENTAMICIN SHOULD NOT BE USED AS SINGLE DRUGS FOR TREATMENT OF STAPH INFECTIONS. ABUNDANT GROUP B STREP(S.AGALACTIAE)ISOLATED Note: TESTING AGAINST S. AGALACTIAE NOT ROUTINELY PERFORMED DUE TO PREDICTABILITY OF AMP/PEN/VAN SUSCEPTIBILITY. Performed at [facility]    Report  Status 11/30/2014 FINAL  Final   Organism ID, Bacteria STAPHYLOCOCCUS AUREUS  Final      Susceptibility   Staphylococcus aureus - MIC*    CLINDAMYCIN <=0.25 SENSITIVE Sensitive     ERYTHROMYCIN <=0.25 SENSITIVE Sensitive     GENTAMICIN <=0.5 SENSITIVE Sensitive     LEVOFLOXACIN <=0.12 SENSITIVE Sensitive     OXACILLIN <=0.25 SENSITIVE Sensitive     RIFAMPIN <=0.5 SENSITIVE Sensitive     TRIMETH/SULFA <=10 SENSITIVE Sensitive     VANCOMYCIN <=0.5 SENSITIVE Sensitive     TETRACYCLINE <=1 SENSITIVE Sensitive     MOXIFLOXACIN <=0.25 SENSITIVE Sensitive     * MODERATE STAPHYLOCOCCUS AUREUS  Surgical pcr screen     Status: None   Collection Time: 11/28/14  8:43 AM  Result Value Ref Range Status   MRSA, PCR NEGATIVE NEGATIVE Final   Staphylococcus aureus NEGATIVE NEGATIVE Final    Comment:        The Xpert SA Assay (FDA approved for NASAL specimens in patients over 68 years of age), is one  component of a comprehensive surveillance program.  Test performance has been validated by [facility] for patients greater than or equal to 52 year old. It is not intended to diagnose infection nor to guide or monitor treatment.   Anaerobic culture     Status: None (Preliminary result)   Collection Time: 11/28/14  4:01 PM  Result Value Ref Range Status   Specimen Description WOUND RIGHT TOE  Final   Special Requests NONE  Final   Gram Stain   Final    RARE WBC PRESENT, PREDOMINANTLY PMN NO SQUAMOUS EPITHELIAL CELLS SEEN MODERATE GRAM POSITIVE COCCI IN PAIRS Performed at [facility]    Culture   Final    NO ANAEROBES ISOLATED; CULTURE IN PROGRESS FOR 5 DAYS Performed at [facility]    Report Status PENDING  Incomplete  Fungus Culture with Smear     Status: None (Preliminary result)   Collection Time: 11/28/14  4:01 PM  Result Value Ref Range Status   Specimen Description WOUND RIGHT TOE  Final   Special Requests NONE  Final   Fungal Smear   Final    NO YEAST OR FUNGAL ELEMENTS SEEN Performed at [facility]    Culture   Final    CULTURE IN PROGRESS FOR FOUR WEEKS Performed at [facility]    Report Status PENDING  Incomplete  Wound culture     Status: None (Preliminary result)   Collection Time: 11/28/14  4:01 PM  Result Value Ref Range Status   Specimen Description WOUND RIGHT TOE  Final   Special Requests NONE  Final   Gram Stain   Final    RARE WBC PRESENT, PREDOMINANTLY PMN NO SQUAMOUS EPITHELIAL CELLS SEEN MODERATE GRAM POSITIVE COCCI IN PAIRS Performed at [facility]    Culture   Final    MODERATE STAPHYLOCOCCUS AUREUS Performed at [facility]    Report Status PENDING  Incomplete  AFB culture with smear     Status: None (Preliminary result)   Collection Time: 11/28/14  4:01 PM  Result Value Ref Range Status   Specimen Description WOUND RIGHT TOE  Final   Special Requests NONE  Final   Acid Fast  Smear   Final    NO ACID FAST BACILLI SEEN Performed at [facility]    Culture   Final    CULTURE WILL BE EXAMINED FOR 6 WEEKS BEFORE ISSUING A  FINAL REPORT Performed at [facility]    Report Status PENDING  Incomplete  Culture, Urine     Status: None   Collection Time: 11/29/14 12:31 PM  Result Value Ref Range Status   Specimen Description URINE, CLEAN CATCH  Final   Special Requests NONE  Final   Culture 30,000 COLONIES/mL YEAST  Final   Report Status 12/01/2014 FINAL  Final     Scheduled Meds: . atorvastatin  10 mg Oral Daily  . carvedilol  12.5 mg Oral BID WC  .  ceFAZolin (ANCEF) IV  2 g Intravenous 3 times per day  . enoxaparin (LOVENOX) injection  40 mg Subcutaneous Q24H  . hydrALAZINE  25 mg Oral 3 times per day  . insulin aspart  0-15 Units Subcutaneous 6 times per day  . insulin glargine  5 Units Subcutaneous QHS  . nicotine  21 mg Transdermal Daily  . vancomycin  1,250 mg Intravenous Q24H   Continuous Infusions: . sodium chloride 125 mL/hr at 12/01/14 0049     [facility], MD  [facility] Pager [phone]  Time spent: 35 minutes   If 7PM-7AM, please contact night-coverage [email or web address] Password [facility] 12/01/2014, 12:38 PM   LOS: 5 days

## 2014-12-01 NOTE — Progress Notes (Signed)
   VASCULAR SURGERY ASSESSMENT & PLAN:  * 3 Days Post-Op s/p: Ray amputation of right great toe  *  I will follow her as an outpatient once discharged. She knows to continue soaking her foot daily and lukewarm dials soaked soaks and do the dressing changes as ordered.  SUBJECTIVE: no specific complaints.  PHYSICAL EXAM: Filed Vitals:   11/30/14 0519 11/30/14 1544 11/30/14 2139 12/01/14 0615  BP: 176/76 126/81 142/65 167/82  Pulse: 92 77 73 73  Temp: 99.7 F (37.6 C) 97.9 F (36.6 C) 98.6 F (37 C) 98.2 F (36.8 C)  TempSrc: Oral Oral Oral Oral  Resp: Height:      Weight:      SpO2:  97% 96% 93%   Her wound was just soaked by the nursing staff and the dressing was just done. She has a palpable graft pulse along the lateral aspect of her right leg.  LABS: Lab Results  Component Value Date   WBC 10.2 12/01/2014   HGB 8.8* 12/01/2014   HCT 28.2* 12/01/2014   MCV 91.6 12/01/2014   PLT 281 12/01/2014   Lab Results  Component Value Date   CREATININE 1.55* 12/01/2014   Lab Results  Component Value Date   INR 1.07 10/20/2014   CBG (last 3)   Recent Labs  11/30/14 2116 12/01/14 0123 12/01/14 0508  GLUCAP 129* 107* 98    Principal Problem:   Necrotic toes Active Problems:   Tobacco abuse   Atherosclerosis of native arteries of the extremities with ulceration   PAD (peripheral artery disease)   Osteomyelitis   HTN (hypertension)   Diabetes mellitus with peripheral vascular disease   Acute osteomyelitis of foot   Type 2 diabetes mellitus with right diabetic foot infection   Diabetic foot infection   ARF (acute renal failure)   Hypokalemia   Shirley Chen Beeper: [phone] 12/01/2014

## 2014-12-02 DIAGNOSIS — M86179 Other acute osteomyelitis, unspecified ankle and foot: Secondary | ICD-10-CM

## 2014-12-02 DIAGNOSIS — I96 Gangrene, not elsewhere classified: Secondary | ICD-10-CM | POA: Diagnosis present

## 2014-12-02 LAB — CBC
HCT: 27.7 % — ABNORMAL LOW (ref 36.0–46.0)
Hemoglobin: 8.7 g/dL — ABNORMAL LOW (ref 12.0–15.0)
MCH: 28.4 pg (ref 26.0–34.0)
MCHC: 31.4 g/dL (ref 30.0–36.0)
MCV: 90.5 fL (ref 78.0–100.0)
Platelets: 327 10*3/uL (ref 150–400)
RBC: 3.06 MIL/uL — ABNORMAL LOW (ref 3.87–5.11)
RDW: 14.1 % (ref 11.5–15.5)
WBC: 10.3 10*3/uL (ref 4.0–10.5)

## 2014-12-02 LAB — WOUND CULTURE

## 2014-12-02 LAB — BASIC METABOLIC PANEL
Anion gap: 9 (ref 5–15)
BUN: 11 mg/dL (ref 6–20)
CO2: 25 mmol/L (ref 22–32)
Calcium: 8.5 mg/dL — ABNORMAL LOW (ref 8.9–10.3)
Chloride: 107 mmol/L (ref 101–111)
Creatinine, Ser: 1.48 mg/dL — ABNORMAL HIGH (ref 0.44–1.00)
GFR calc Af Amer: 44 mL/min — ABNORMAL LOW (ref >60)
GFR calc non Af Amer: 38 mL/min — ABNORMAL LOW (ref >60)
Glucose, Bld: 144 mg/dL — ABNORMAL HIGH (ref 65–99)
Potassium: 3.8 mmol/L (ref 3.5–5.1)
Sodium: 141 mmol/L (ref 135–145)

## 2014-12-02 LAB — GLUCOSE, CAPILLARY
Glucose-Capillary: 105 mg/dL — ABNORMAL HIGH (ref 65–99)
Glucose-Capillary: 127 mg/dL — ABNORMAL HIGH (ref 65–99)
Glucose-Capillary: 130 mg/dL — ABNORMAL HIGH (ref 65–99)
Glucose-Capillary: 147 mg/dL — ABNORMAL HIGH (ref 65–99)
Glucose-Capillary: 150 mg/dL — ABNORMAL HIGH (ref 65–99)
Glucose-Capillary: 165 mg/dL — ABNORMAL HIGH (ref 65–99)

## 2014-12-02 MED ORDER — AMOXICILLIN-POT CLAVULANATE 875-125 MG PO TABS
1.0000 | ORAL_TABLET | Freq: Two times a day (BID) | ORAL | Status: DC
  Administered 2014-12-02 – 2014-12-03 (×3): 1 via ORAL
  Filled 2014-12-02 (×3): qty 1

## 2014-12-02 MED ORDER — CARVEDILOL 25 MG PO TABS
25.0000 mg | ORAL_TABLET | Freq: Two times a day (BID) | ORAL | Status: DC
  Administered 2014-12-02 – 2014-12-03 (×3): 25 mg via ORAL
  Filled 2014-12-02 (×3): qty 1

## 2014-12-02 MED ORDER — HYDRALAZINE HCL 50 MG PO TABS
50.0000 mg | ORAL_TABLET | Freq: Three times a day (TID) | ORAL | Status: DC
  Administered 2014-12-02 – 2014-12-03 (×2): 50 mg via ORAL
  Filled 2014-12-02 (×2): qty 1

## 2014-12-02 MED ORDER — OXYCODONE-ACETAMINOPHEN 5-325 MG PO TABS: 1.0000 | ORAL_TABLET | ORAL | Status: DC | PRN

## 2014-12-02 MED ORDER — HYDRALAZINE HCL 25 MG PO TABS
25.0000 mg | ORAL_TABLET | Freq: Once | ORAL | Status: AC
  Administered 2014-12-02: 25 mg via ORAL
  Filled 2014-12-02: qty 1

## 2014-12-02 MED ORDER — CARVEDILOL 25 MG PO TABS: 25.0000 mg | ORAL_TABLET | Freq: Two times a day (BID) | ORAL | Status: DC

## 2014-12-02 MED ORDER — DOCUSATE SODIUM 100 MG PO CAPS: 100.0000 mg | ORAL_CAPSULE | Freq: Two times a day (BID) | ORAL | Status: DC

## 2014-12-02 MED ORDER — AMOXICILLIN-POT CLAVULANATE 875-125 MG PO TABS: 1.0000 | ORAL_TABLET | Freq: Two times a day (BID) | ORAL | Status: AC

## 2014-12-02 MED ORDER — LORATADINE 10 MG PO TABS: 10.0000 mg | ORAL_TABLET | Freq: Every day | ORAL | Status: DC

## 2014-12-02 MED ORDER — INSULIN LISPRO PROT & LISPRO (75-25 MIX) 100 UNIT/ML ~~LOC~~ SUSP: 10.0000 [IU] | Freq: Two times a day (BID) | SUBCUTANEOUS | Status: DC

## 2014-12-02 MED ORDER — NICOTINE 21 MG/24HR TD PT24: 21.0000 mg | MEDICATED_PATCH | Freq: Every day | TRANSDERMAL | Status: DC

## 2014-12-02 MED ORDER — CARVEDILOL 12.5 MG PO TABS: 12.5000 mg | ORAL_TABLET | Freq: Two times a day (BID) | ORAL | Status: DC

## 2014-12-02 NOTE — Progress Notes (Signed)
Patient given discharge instructions; prescriptions given to patient.  Patient denies questions.  Patient arranged transportation with family friend to her home.  All belongings with patient at bedside.  Will continue to monitor.

## 2014-12-02 NOTE — Progress Notes (Signed)
Patients BP was elevated BP = 214/81, scheduled hydralazine  po given, on call Dr. Janee Morn contacted.  Repeat BP will be taken in 1 hour and MD notified.  Will continue to monitor.

## 2014-12-02 NOTE — Discharge Summary (Addendum)
Physician Discharge Summary  Shirley Chen OFB:[id number] DOB: 19-Dec-1955 DOA: 11/26/2014  PCP: [facility]  Admit date: 11/26/2014 Discharge date: 12/03/2014  Time spent: 70 minutes  Recommendations for Outpatient Follow-up:  1. Follow-up with [facility] as scheduled on 12/21/2014. On follow-up patient need a basic metabolic profile done to follow-up on electrolytes and renal function. Patient's blood pressure also need to be reassessed at that time. Patient's diabetes also need to be reassessed. 2. Follow-up with Dr. Scot Dock of vascular surgery on 12/20/2014.  Discharge Diagnoses:  Principal Problem:   Gangrene of toe: Right great toe with osteomyelitis Active Problems:   Acute osteomyelitis of foot   Diabetic foot infection   Tobacco abuse   Atherosclerosis of native arteries of the extremities with ulceration   Necrotic toes   PAD (peripheral artery disease)   Osteomyelitis   HTN (hypertension)   Diabetes mellitus with peripheral vascular disease   Type 2 diabetes mellitus with right diabetic foot infection   ARF (acute renal failure)   Hypokalemia   Discharge Condition: Stable and improved  Diet recommendation: carb modified  Filed Weights   11/28/14 1018  Weight: 85.276 kg (188 lb)    History of present illness:  Per Dr. Janyth Pupa is a 59 y.o. female with a past medical history of severe peripheral arterial disease status post t right femoral artery to anterior tibial bypass using right greater saphenous vein on 10/27/2014, also having history of hypertension, diabetes mellitus and tobacco abuse. Patient had been having significant right foot pain and calf claudication for the past several months with the development of wounds involving her right great toe and lateral aspect of heel. She underwent angiography on 10/23/2014 that showed infra-inguinal arterial occlusive disease. Given nonhealing ulcers of her right foot  she was admitted to the vascular surgery service undergoing right common femoral artery to anterior tibial artery bypass. She reported worsening appearance of her right necrotic toe developing purulence in the plantar aspect of her toe associated with malodor. She also reported new ulceration developing the affected toe with associated purulence as well. There was also been increasing erythema. In the emergency department x-ray revealed the presence of osteomyelitis involving the head of the proximal phalanx and the base of the distal phalanx with IP joint infection also suspected. Dr Coralyn Pear spoke with Dr. Scot Dock of vascular surgery who agreed to see the patient in consultation.      Hospital Course:  Gangrene of the right great toe with acute osteomyelitis of the right foot/diabetic foot infection in the setting of peripheral vascular disease Patient was admitted with gangrenous right great toe with acute osteomyelitis of the right foot in the setting of peripheral vascular disease. Vascular surgery was consulted and patient was seen in consultation by Dr. Scot Dock. Patient was placed empirically on IV vancomycin and IV Zosyn. Patient was followed.ESR was 68. CRP 4.6. Patient's cellulitis improved. -repeat MRI foot--acute osteomyelitis distal and proximal phalanges of the hallux, septic arthritis of first inter-phalangeal joint, myositis of plantar musculature Patient subsequently underwent ray amputation of right great toe 11/28/2014 per Dr. Scot Dock. Wound cultures obtained 11/26/2014 with MSSA and gp B strep agalactiae. IV antibiotics were subsequently changed to IV vancomycin and IV Ancef and then narrowed down to IV Ancef. Intraoperative cultures obtained 11/28/2014 with group B strep agalactaie and MSSA which was pansensitive. Patient was subsequently transitioned to oral Augmentin home to discharge on oral Augmentin for 2 more weeks. Acute renal failure -During  the hospitalization patient was noted to go into acute renal failure with creatinine going up as high as 1.80. Patient wasn't on any nephrotoxic medications at this time. MRI was done of the foot with and without contrast on 11/28/2014. Vancomycin trough levels were subtherapeutic at 10 on 11/28/2014. May be secondary to a prerenal azotemia. FENA = 0.66. Renal US negative for hydronephoresis. Patient was placed on a IV fluids with improvement in her renal function such that by day of discharge the creatinine was down to 1.48. Patient will follow-up with PCP as outpatient.  Peripheral vascular disease -10/27/2014--Dr. Scot Dock performed Right common femoral artery to anterior tibial artery bypass with non-reversed translocated saphenous vein graft. Patient was followed by vascular surgery throughout the hospitalization. Outpatient follow-up. Diabetes mellitus type 2, poorly controlled -10/21/2014 hemoglobin A1c 12.9 -pt was only taking 75/25 at 15 units once daily at home (instead of BID) -she would like to switch to once daily insulin. Patient was maintained on Lantus 5 units daily with a good blood glucose levels. Patient be discharged home on 75/25 at 10 units twice daily. Patient wanted to follow-up with PCP as outpatient. Hypertension -Continued initially on home regimen of Coreg and hydralazine. Patient's dose of Coreg was increased to 12.5 mg twice daily. Outpatient follow-up.  Hyperlipidemia -Continued on home regimen of Lipitor Tobacco abuse -40-pack-year history -pt has quit Hypokalemia -Repleted. Magnesium level was 2.   Procedures:  Status post ray amputation right great toe 11/28/2014 per Dr. Scot Dock  Plain films of the right great toe 11/26/2014  MRI of the right foot 11/28/2014  Renal ultrasound 11/29/2014  Consultations:  Vascular surgery: Dr. Scot Dock 11/26/2014  Discharge Exam: Filed Vitals:   12/03/14 1500  BP: 182/87  Pulse: 83  Temp: 98.1 F (36.7 C)  Resp: 18     General: NAD Cardiovascular: RRR Respiratory: CTAB  Discharge Instructions   Discharge Instructions    Diet Carb Modified    Complete by:  As directed      Discharge instructions    Complete by:  As directed   Follow up with [facility] as scheduled. Follow up with Dr Scot Dock as scheduled.     Increase activity slowly    Complete by:  As directed           Current Discharge Medication List    START taking these medications   Details  amLODipine (NORVASC) 5 MG tablet Take 1 tablet (5 mg total) by mouth daily. Qty: 30 tablet, Refills: 0    amoxicillin-clavulanate (AUGMENTIN) 875-125 MG per tablet Take 1 tablet by mouth every 12 (twelve) hours. Take for 2 weeks then stop. Qty: 28 tablet, Refills: 0    docusate sodium (COLACE) 100 MG capsule Take 1 capsule (100 mg total) by mouth 2 (two) times daily. Qty: 10 capsule, Refills: 0    loratadine (CLARITIN) 10 MG tablet Take 1 tablet (10 mg total) by mouth daily.    oxyCODONE-acetaminophen (PERCOCET/ROXICET) 5-325 MG per tablet Take 1-2 tablets by mouth every 4 (four) hours as needed for severe pain. Qty: 20 tablet, Refills: 0      CONTINUE these medications which have CHANGED   Details  carvedilol (COREG) 25 MG tablet Take 1 tablet (25 mg total) by mouth 2 (two) times daily with a meal. Qty: 62 tablet, Refills: 0    hydrALAZINE (APRESOLINE) 25 MG tablet Take 3 tablets (75 mg total) by mouth 3 (three) times daily. Qty: 270 tablet, Refills: 0    insulin  lispro protamine-lispro (HUMALOG 75/25 MIX) (75-25) 100 UNIT/ML SUSP injection Inject 10 Units into the skin 2 (two) times daily with a meal. Dx E11.65, can switch to any approved brand Qty: 10 mL, Refills: 0    nicotine (NICODERM CQ - DOSED IN MG/24 HOURS) 21 mg/24hr patch Place 1 patch (21 mg total) onto the skin daily. Qty: 21 patch, Refills: 0      CONTINUE these medications which have NOT CHANGED   Details  aspirin EC 81 MG tablet Take 1  tablet (81 mg total) by mouth daily. Qty: 30 tablet, Refills: 0    atorvastatin (LIPITOR) 10 MG tablet Take 1 tablet (10 mg total) by mouth daily. Qty: 30 tablet, Refills: 0    Melatonin 10 MG TBDP Take 20 mg by mouth at bedtime.    Vitamin D, Ergocalciferol, (DRISDOL) 50000 UNITS CAPS capsule Take 1 capsule (50,000 Units total) by mouth every 7 (seven) days. Qty: 12 capsule, Refills: 0    glucose monitoring kit (FREESTYLE) monitoring kit 1 each by Does not apply route 4 (four) times daily - after meals and at bedtime. 1 month Diabetic Testing Supplies for QAC-QHS accuchecks. Any brand OK. Diagnosis E11.65 Qty: 1 each, Refills: 1    Insulin Syringe-Needle U-100 25G X 1" 1 ML MISC For 4 times a day insulin SQ, 1 month supply. Diagnosis E11.65 Qty: 30 each, Refills: 0       No Known Allergies Follow-up Information    Follow up with [facility]. Go on 12/21/2014.   Why:  11am at [address] [place] [address]   Contact information:   [address] [place] [address] [phone]       Follow up with Deitra Mayo, MD. Go on 12/20/2014.   Specialties:  Vascular Surgery, Cardiology   Why:  1030 am   Contact information:   [address] [place] [address] [phone]        The results of significant diagnostics from this hospitalization (including imaging, microbiology, ancillary and laboratory) are listed below for reference.    Significant Diagnostic Studies: US Renal  11/29/2014   CLINICAL DATA:  Acute renal failure  EXAM: RENAL / URINARY TRACT ULTRASOUND COMPLETE  COMPARISON:  None  FINDINGS: Right Kidney:  Length: 11.7 cm. Echogenicity within normal limits. No mass or hydronephrosis visualized.  Left Kidney:  Length: 11.2 cm. Echogenicity within normal limits. No mass or hydronephrosis visualized.  Bladder:  Echogenic material within the lumen of the gallbladder.  IMPRESSION: 1. Normal kidneys.  No hydronephrosis. 2. Echogenic  material within the bladder could represent lesion, clot or debris.   Electronically Signed   By: Suzy Bouchard M.D.   On: 11/29/2014 20:03   Mr Foot Right W Wo Contrast  11/28/2014   CLINICAL DATA:  Nonhealing ulcer of the right great toe with drainage. Ulcer along the right medial heel.  EXAM: MRI OF THE RIGHT FOOT WITHOUT AND WITH CONTRAST  TECHNIQUE: Multiplanar, multisequence MR imaging was performed both before and after administration of intravenous contrast. Osteomyelitis protocol MRI of the foot was obtained, to include the entire foot and ankle. This protocol uses a large field of view to cover the entire foot and ankle, and is suitable for assessing bony structures for osteomyelitis. Due to the large field of view and imaging plane choice, this protocol is less sensitive for assessing small structures such as ligamentous structures of the foot and ankle, compared to a dedicated forefoot or dedicated  hindfoot exam.  CONTRAST:  24m MULTIHANCE GADOBENATE DIMEGLUMINE 529 MG/ML IV SOLN  COMPARISON:  Multiple exams, including 11/26/2014 and 10/21/2014  FINDINGS: Abnormal edema and enhancement in the proximal and distal phalanges of the great toe, with extensive surrounding subcutaneous/soft tissue enhancement. Ulcerations of the great toe are present, especially medially, and the possibility of septic arthritis of the interphalangeal joint of the great toe is raised.  Low-level edema medially in the anterior talar dome and adjacent talar neck, likely attributable to degenerative arthropathy at the tibiotalar joint and possible underlying non-fragmented osteochondral lesion of the medial talar dome. I am doubtful of osteomyelitis in this vicinity.  Lisfranc ligament intact. No compelling findings of osteomyelitis in the hindfoot, with the understanding that part of the hindfoot is obscured by the metal artifact. Is  Plantar and Achilles calcaneal spurs. Dorsal midfoot spurring. Low-level increased T2  signal along the medial band of the plantar fascia proximally, potentially from plantar fasciitis.  Edema and infiltrative enhancement tracks along the plantar musculature of the foot, and also in the dorsal subcutaneous tissues of the foot, suggesting cellulitis and myositis.  IMPRESSION: 1. Active osteomyelitis of the proximal and distal phalanges of the great toe, with gas in the soft tissues medially, ulceration especially medially, and concern for septic arthritis of the interphalangeal joint. 2. Myositis of the plantar musculature of the foot with dorsal cellulitis in the forefoot. 3. Non-fragmented osteochondral lesion of the medial talar dome. No definite hindfoot osteomyelitis. 4. Plantar fasciitis. 5. Distal tibial and fibular deformity due to prior fracture. A lag screw in the distal tibia creates some artifact which obscures surrounding structures. 6. Degenerative arthropathy at the tibiotalar articulation.   Electronically Signed   By: WVan ClinesM.D.   On: 11/28/2014 08:22   Dg Toe Great Right  11/26/2014   CLINICAL DATA:  Diabetic with infection of the right great toe.  EXAM: RIGHT GREAT TOE - 3 VIEWS  COMPARISON:  Right foot x-ray 10/20/2014.  FINDINGS: Severe soft tissue swelling involving the great toe. Loss of the cortical margin of the proximal aspect of the distal phalanx, new since the examination 5 weeks ago. Bone fragments arising from the head of the proximal phalanx, also new. No evidence of pathologic fracture. Mild narrowing of the IP joint space.  IMPRESSION: Osteomyelitis involving the head of the proximal phalanx and the base of the distal phalanx with IP joint infection also suspected.   Electronically Signed   By: TEvangeline DakinM.D.   On: 11/26/2014 12:37    Microbiology: Recent Results (from the past 240 hour(s))  Wound culture     Status: None   Collection Time: 11/26/14 11:11 AM  Result Value Ref Range Status   Specimen Description WOUND RIGHT FOOT  Final    Special Requests Immunocompromised  Final   Gram Stain   Final    RARE WBC PRESENT, PREDOMINANTLY PMN NO SQUAMOUS EPITHELIAL CELLS SEEN MODERATE GRAM POSITIVE COCCI IN PAIRS Performed at [facility]   Culture   Final    MODERATE STAPHYLOCOCCUS AUREUS Note: RIFAMPIN AND GENTAMICIN SHOULD NOT BE USED AS SINGLE DRUGS FOR TREATMENT OF STAPH INFECTIONS. ABUNDANT GROUP B STREP(S.AGALACTIAE)ISOLATED Note: TESTING AGAINST S. AGALACTIAE NOT ROUTINELY PERFORMED DUE TO PREDICTABILITY OF AMP/PEN/VAN SUSCEPTIBILITY. Performed at [facility]   Report Status 11/30/2014 FINAL  Final   Organism ID, Bacteria STAPHYLOCOCCUS AUREUS  Final      Susceptibility   Staphylococcus aureus - MIC*    CLINDAMYCIN <=0.25  SENSITIVE Sensitive     ERYTHROMYCIN <=0.25 SENSITIVE Sensitive     GENTAMICIN <=0.5 SENSITIVE Sensitive     LEVOFLOXACIN <=0.12 SENSITIVE Sensitive     OXACILLIN <=0.25 SENSITIVE Sensitive     RIFAMPIN <=0.5 SENSITIVE Sensitive     TRIMETH/SULFA <=10 SENSITIVE Sensitive     VANCOMYCIN <=0.5 SENSITIVE Sensitive     TETRACYCLINE <=1 SENSITIVE Sensitive     MOXIFLOXACIN <=0.25 SENSITIVE Sensitive     * MODERATE STAPHYLOCOCCUS AUREUS  Surgical pcr screen     Status: None   Collection Time: 11/28/14  8:43 AM  Result Value Ref Range Status   MRSA, PCR NEGATIVE NEGATIVE Final   Staphylococcus aureus NEGATIVE NEGATIVE Final    Comment:        The Xpert SA Assay (FDA approved for NASAL specimens in patients over 37 years of age), is one component of a comprehensive surveillance program.  Test performance has been validated by [facility] for patients greater than or equal to 58 year old. It is not intended to diagnose infection nor to guide or monitor treatment.   Anaerobic culture     Status: None   Collection Time: 11/28/14  4:01 PM  Result Value Ref Range Status   Specimen Description WOUND RIGHT TOE  Final   Special Requests NONE  Final   Gram Stain   Final     RARE WBC PRESENT, PREDOMINANTLY PMN NO SQUAMOUS EPITHELIAL CELLS SEEN MODERATE GRAM POSITIVE COCCI IN PAIRS Performed at [facility]    Culture   Final    NO ANAEROBES ISOLATED Performed at [facility]    Report Status 12/03/2014 FINAL  Final  Fungus Culture with Smear     Status: None (Preliminary result)   Collection Time: 11/28/14  4:01 PM  Result Value Ref Range Status   Specimen Description WOUND RIGHT TOE  Final   Special Requests NONE  Final   Fungal Smear   Final    NO YEAST OR FUNGAL ELEMENTS SEEN Performed at [facility]    Culture   Final    CULTURE IN PROGRESS FOR FOUR WEEKS Performed at [facility]    Report Status PENDING  Incomplete  Wound culture     Status: None   Collection Time: 11/28/14  4:01 PM  Result Value Ref Range Status   Specimen Description WOUND RIGHT TOE  Final   Special Requests NONE  Final   Gram Stain   Final    RARE WBC PRESENT, PREDOMINANTLY PMN NO SQUAMOUS EPITHELIAL CELLS SEEN MODERATE GRAM POSITIVE COCCI IN PAIRS Performed at [facility]    Culture   Final    MODERATE STAPHYLOCOCCUS AUREUS Note: RIFAMPIN AND GENTAMICIN SHOULD NOT BE USED AS SINGLE DRUGS FOR TREATMENT OF STAPH INFECTIONS. REPORT FAXED BY REQUEST MODERATE GROUP B STREP(S.AGALACTIAE)ISOLATED Note: TESTING AGAINST S. AGALACTIAE NOT ROUTINELY PERFORMED DUE TO PREDICTABILITY OF AMP/PEN/VAN SUSCEPTIBILITY. Performed at [facility]    Report Status 12/02/2014 FINAL  Final   Organism ID, Bacteria STAPHYLOCOCCUS AUREUS  Final      Susceptibility   Staphylococcus aureus - MIC*    CLINDAMYCIN <=0.25 SENSITIVE Sensitive     ERYTHROMYCIN <=0.25 SENSITIVE Sensitive     GENTAMICIN <=0.5 SENSITIVE Sensitive     LEVOFLOXACIN <=0.12 SENSITIVE Sensitive     OXACILLIN <=0.25 SENSITIVE Sensitive     RIFAMPIN <=0.5 SENSITIVE Sensitive     TRIMETH/SULFA <=10 SENSITIVE Sensitive     VANCOMYCIN 1 SENSITIVE Sensitive  TETRACYCLINE <=1 SENSITIVE Sensitive     MOXIFLOXACIN <=0.25 SENSITIVE Sensitive     * MODERATE STAPHYLOCOCCUS AUREUS  AFB culture with smear     Status: None (Preliminary result)   Collection Time: 11/28/14  4:01 PM  Result Value Ref Range Status   Specimen Description WOUND RIGHT TOE  Final   Special Requests NONE  Final   Acid Fast Smear   Final    NO ACID FAST BACILLI SEEN Performed at [facility]    Culture   Final    CULTURE WILL BE EXAMINED FOR 6 WEEKS BEFORE ISSUING A FINAL REPORT Performed at [facility]    Report Status PENDING  Incomplete  Culture, Urine     Status: None   Collection Time: 11/29/14 12:31 PM  Result Value Ref Range Status   Specimen Description URINE, CLEAN CATCH  Final   Special Requests NONE  Final   Culture 30,000 COLONIES/mL YEAST  Final   Report Status 12/01/2014 FINAL  Final     Labs: Basic Metabolic Panel:  Recent Labs Lab 11/27/14 1643  11/29/14 0411 11/30/14 0505 12/01/14 0415 12/02/14 0359 12/03/14 0635  NA  --   < > 139 140 141 141 140  K  --   < > 3.4* 4.1 4.2 3.8 3.7  CL  --   < > 105 108 109 107 107  CO2  --   < > _0 GLUCOSE  --   < > 178* 145* 110* 144* 113*  BUN  --   < > _1 CREATININE  --   < > 1.80* 1.74* 1.55* 1.48* 1.40*  CALCIUM  --   < > 8.2* 8.5* 8.3* 8.5* 8.6*  MG 1.7  --  1.8 2.0  --   --   --   < > = values in this interval not displayed. Liver Function Tests: No results for input(s): AST, ALT, ALKPHOS, BILITOT, PROT, ALBUMIN in the last 168 hours. No results for input(s): LIPASE, AMYLASE in the last 168 hours. No results for input(s): AMMONIA in the last 168 hours. CBC:  Recent Labs Lab 11/28/14 0521 11/29/14 0411 11/30/14 0505 12/01/14 0415 12/02/14 0359  WBC 10.5 13.2* 13.4* 10.2 10.3  HGB 9.8* 9.5* 9.8* 8.8* 8.7*  HCT 30.2* 29.5* 30.7* 28.2* 27.7*  MCV 89.1 89.7 91.4 91.6 90.5  PLT 285 295 302 281 327   Cardiac Enzymes:  Recent Labs Lab  11/30/14 0505 11/30/14 1046 11/30/14 1644  TROPONINI 0.04* 0.05* 0.05*   BNP: BNP (last 3 results) No results for input(s): BNP in the last 8760 hours.  ProBNP (last 3 results) No results for input(s): PROBNP in the last 8760 hours.  CBG:  Recent Labs Lab 12/02/14 1958 12/03/14 0013 12/03/14 0455 12/03/14 0825 12/03/14 1128  GLUCAP 165* 87 140* 136* 139*       Signed:  THOMPSON,DANIEL MD [facility] 12/03/2014, 4:07 PM   Addendum: 12/03/2014  1607hrs  Patient was to be discharged on 12/02/2014 however, at time of discharge patient's systolic blood pressures were noted to be in the 200s and as such discharge was cancelled. Adjustments were made to patient's antihypertensives medications. Patient's hydralazine was increased to 75 mg 3 times daily. Patient's Coreg was increased to 25 mg twice daily and Norvasc 40m daily was added to patient's regimen.

## 2014-12-02 NOTE — Progress Notes (Signed)
MD has placed patient's discharge on hold until tomorrow due to elevated blood pressures.  Will continue to monitor patient.

## 2014-12-03 LAB — ANAEROBIC CULTURE

## 2014-12-03 LAB — BASIC METABOLIC PANEL
Anion gap: 9 (ref 5–15)
BUN: 10 mg/dL (ref 6–20)
CO2: 24 mmol/L (ref 22–32)
Calcium: 8.6 mg/dL — ABNORMAL LOW (ref 8.9–10.3)
Chloride: 107 mmol/L (ref 101–111)
Creatinine, Ser: 1.4 mg/dL — ABNORMAL HIGH (ref 0.44–1.00)
GFR calc Af Amer: 47 mL/min — ABNORMAL LOW (ref >60)
GFR calc non Af Amer: 41 mL/min — ABNORMAL LOW (ref >60)
Glucose, Bld: 113 mg/dL — ABNORMAL HIGH (ref 65–99)
Potassium: 3.7 mmol/L (ref 3.5–5.1)
Sodium: 140 mmol/L (ref 135–145)

## 2014-12-03 LAB — GLUCOSE, CAPILLARY
Glucose-Capillary: 136 mg/dL — ABNORMAL HIGH (ref 65–99)
Glucose-Capillary: 139 mg/dL — ABNORMAL HIGH (ref 65–99)
Glucose-Capillary: 140 mg/dL — ABNORMAL HIGH (ref 65–99)
Glucose-Capillary: 140 mg/dL — ABNORMAL HIGH (ref 65–99)
Glucose-Capillary: 87 mg/dL (ref 65–99)

## 2014-12-03 MED ORDER — AMLODIPINE BESYLATE 5 MG PO TABS
5.0000 mg | ORAL_TABLET | Freq: Every day | ORAL | Status: DC
  Administered 2014-12-03: 5 mg via ORAL
  Filled 2014-12-03: qty 1

## 2014-12-03 MED ORDER — HYDRALAZINE HCL 25 MG PO TABS: 75.0000 mg | ORAL_TABLET | Freq: Three times a day (TID) | ORAL | Status: DC

## 2014-12-03 MED ORDER — AMLODIPINE BESYLATE 5 MG PO TABS: 5.0000 mg | ORAL_TABLET | Freq: Every day | ORAL | Status: DC

## 2014-12-03 MED ORDER — HYDRALAZINE HCL 25 MG PO TABS
25.0000 mg | ORAL_TABLET | Freq: Once | ORAL | Status: AC
  Administered 2014-12-03: 25 mg via ORAL
  Filled 2014-12-03: qty 1

## 2014-12-03 MED ORDER — HYDRALAZINE HCL 50 MG PO TABS
75.0000 mg | ORAL_TABLET | Freq: Three times a day (TID) | ORAL | Status: DC
  Administered 2014-12-03: 75 mg via ORAL
  Filled 2014-12-03 (×2): qty 1

## 2014-12-03 MED ORDER — CARVEDILOL 25 MG PO TABS: 25.0000 mg | ORAL_TABLET | Freq: Two times a day (BID) | ORAL | Status: DC

## 2014-12-03 NOTE — Progress Notes (Signed)
PROGRESS NOTE  Shirley Chen BSJ:[id number] DOB: Oct 26, 1955 DOA: 11/26/2014 PCP: [facility]   Brief history 59 year old female with history of diabetes mellitus, peripheral vascular disease, continued tobacco use, and hypertension presented with new wounds on her right hallux and lateral hee as well as purulent drainage on the plantar aspect of her toe. l. The patient was recently discharged from the hospital on 10/30/2014 after right femoral to anterior tibial bypass using the right greater saphenous vein. During that admission, the patient was noted to have a diabetic foot infection and was started on intravenous vancomycin and Zosyn. After angiography was performed on 10/23/2014, that showed Infrainguinal arterial occlusive disease. She was subsequently taken to the operating room on 10/27/2014 where Dr. Scot Dock performed a Right common femoral artery to anterior tibial artery bypass with non-reversed translocated saphenous vein graft. MRI of her foot on 10/21/2014 revealed soft tissue edema without enhancement, abscess, or frank osteomyelitis, but did show some marrow edema on the right first distal phalanx. The patient was ultimately discharged home with trimethoprim sulfamethoxazole. Assessment/Plan: Acute osteomyelitis of the right foot/diabetic foot infection in the setting of peripheral vascular disease Patient status post ray amputation of right great toe 11/28/2014 per Dr. Scot Dock. Wound cultures obtained 11/26/2014 with MSSA and gp B strep agalactiae. Prelim Intraoperative cultures obtained 11/28/2014 with moderate staph. ESR was 68. CRP 4.6. -repeat MRI foot--acute osteomyelitis distal and proximal phalanges of the hallux, septic arthritis of first inter-phalangeal joint, myositis of plantar musculature -can go home on oral abx pending culture results. D/C IV antibiotis and change to oral augmentin for 2 more weeks. -appreciate Dr. Scot Dock Acute renal  failure -?? Etiology. Patient is not on any nephrotoxic medications at this time. MRI was done of the foot with and without contrast on 11/28/2014. Vancomycin trough levels were subtherapeutic at 10 on 11/28/2014. May be secondary to a prerenal azotemia. FENA = 0.66. Renal US negative for hydronephoresis. Renal function improved with hydration. Continue IVF. Peripheral vascular disease -10/27/2014--Dr. Scot Dock performed Right common femoral artery to anterior tibial artery bypass with non-reversed translocated saphenous vein graft. Per vascular surgery. Diabetes mellitus type 2, poorly controlled -10/21/2014 hemoglobin A1c 12.9 -pt was only taking 75/25 once daily at home (instead of BID) -she would like to switch to once daily insulin - CBGs have ranged from 87-140. -Continue current regimen of Lantus 5 units daily at bedtime and sliding scale insulin. Outpatient follow-up. Hypertension -Patient was to be discharged yesterday however systolic blood pressures when the 200s and a such discharge was canceled. Patient's Coreg dose has been increased to 25 mg twice a day. Hydralazine was increased to 50 mg 3 times a day however patient with still elevated systolic blood pressures in the 180s and a such will increase hydralazine to 75 mg 3 times daily and add Norvasc at 5 mg daily. Follow. Hyperlipidemia -Continue Lipitor Tobacco abuse -40-pack-year history -pt has quit Hypokalemia -Repleted. Magnesium level was 2.   Antibiotics IV Vancomycin 11/28/2014>>>>> 12/01/2014 IV Zosyn 11/26/14>>>11/29/14 IV Ancef 11/29/2014>>>> 12/02/2014 Oral Augmentin 12/02/2014   Family Communication: Updated patient. No family at beside Disposition Plan: Home when renal function improves and trending down in 1-2 days   Procedures/Studies: US Renal  11/29/2014   CLINICAL DATA:  Acute renal failure  EXAM: RENAL / URINARY TRACT ULTRASOUND COMPLETE  COMPARISON:  None  FINDINGS: Right Kidney:  Length: 11.7 cm.  Echogenicity within normal limits. No mass or hydronephrosis visualized.  Left Kidney:  Length: 11.2 cm. Echogenicity within normal limits. No mass or hydronephrosis visualized.  Bladder:  Echogenic material within the lumen of the gallbladder.  IMPRESSION: 1. Normal kidneys.  No hydronephrosis. 2. Echogenic material within the bladder could represent lesion, clot or debris.   Electronically Signed   By: Suzy Bouchard M.D.   On: 11/29/2014 20:03   Mr Foot Right W Wo Contrast  11/28/2014   CLINICAL DATA:  Nonhealing ulcer of the right great toe with drainage. Ulcer along the right medial heel.  EXAM: MRI OF THE RIGHT FOOT WITHOUT AND WITH CONTRAST  TECHNIQUE: Multiplanar, multisequence MR imaging was performed both before and after administration of intravenous contrast. Osteomyelitis protocol MRI of the foot was obtained, to include the entire foot and ankle. This protocol uses a large field of view to cover the entire foot and ankle, and is suitable for assessing bony structures for osteomyelitis. Due to the large field of view and imaging plane choice, this protocol is less sensitive for assessing small structures such as ligamentous structures of the foot and ankle, compared to a dedicated forefoot or dedicated hindfoot exam.  CONTRAST:  30m MULTIHANCE GADOBENATE DIMEGLUMINE 529 MG/ML IV SOLN  COMPARISON:  Multiple exams, including 11/26/2014 and 10/21/2014  FINDINGS: Abnormal edema and enhancement in the proximal and distal phalanges of the great toe, with extensive surrounding subcutaneous/soft tissue enhancement. Ulcerations of the great toe are present, especially medially, and the possibility of septic arthritis of the interphalangeal joint of the great toe is raised.  Low-level edema medially in the anterior talar dome and adjacent talar neck, likely attributable to degenerative arthropathy at the tibiotalar joint and possible underlying non-fragmented osteochondral lesion of the medial talar dome. I  am doubtful of osteomyelitis in this vicinity.  Lisfranc ligament intact. No compelling findings of osteomyelitis in the hindfoot, with the understanding that part of the hindfoot is obscured by the metal artifact. Is  Plantar and Achilles calcaneal spurs. Dorsal midfoot spurring. Low-level increased T2 signal along the medial band of the plantar fascia proximally, potentially from plantar fasciitis.  Edema and infiltrative enhancement tracks along the plantar musculature of the foot, and also in the dorsal subcutaneous tissues of the foot, suggesting cellulitis and myositis.  IMPRESSION: 1. Active osteomyelitis of the proximal and distal phalanges of the great toe, with gas in the soft tissues medially, ulceration especially medially, and concern for septic arthritis of the interphalangeal joint. 2. Myositis of the plantar musculature of the foot with dorsal cellulitis in the forefoot. 3. Non-fragmented osteochondral lesion of the medial talar dome. No definite hindfoot osteomyelitis. 4. Plantar fasciitis. 5. Distal tibial and fibular deformity due to prior fracture. A lag screw in the distal tibia creates some artifact which obscures surrounding structures. 6. Degenerative arthropathy at the tibiotalar articulation.   Electronically Signed   By: WVan ClinesM.D.   On: 11/28/2014 08:22   Dg Toe Great Right  11/26/2014   CLINICAL DATA:  Diabetic with infection of the right great toe.  EXAM: RIGHT GREAT TOE - 3 VIEWS  COMPARISON:  Right foot x-ray 10/20/2014.  FINDINGS: Severe soft tissue swelling involving the great toe. Loss of the cortical margin of the proximal aspect of the distal phalanx, new since the examination 5 weeks ago. Bone fragments arising from the head of the proximal phalanx, also new. No evidence of pathologic fracture. Mild narrowing of the IP joint space.  IMPRESSION: Osteomyelitis involving the head of the proximal phalanx and the base  of the distal phalanx with IP joint infection also  suspected.   Electronically Signed   By: Evangeline Dakin M.D.   On: 11/26/2014 12:37   status post ray amputation of right great toe per Dr. Scot Dock 11/28/2014      Subjective: Patient states pain is controlled. Patient states has good urine output. Patient denies any chest pain. No shortness of breath.   Objective: Filed Vitals:   12/02/14 1906 12/02/14 2021 12/03/14 0453 12/03/14 0900  BP: 156/68 169/76 168/67 175/80  Pulse: 81 79 72   Temp:  98.4 F (36.9 C) 97.7 F (36.5 C)   TempSrc:  Oral Oral   Resp:  18 16   Height:      Weight:      SpO2:  95% 95%     Intake/Output Summary (Last 24 hours) at 12/03/14 0953 Last data filed at 12/02/14 1500  Gross per 24 hour  Intake      0 ml  Output      0 ml  Net      0 ml   Weight change:  Exam:   General:  Pt is alert, follows commands. NAD.  HEENT: No icterus, No thrush, No neck mass, [place]/AT  Cardiovascular: RRR, S1/S2, no rubs, no gallops  Respiratory: CTA bilaterally, no wheezing, no crackles, no rhonchi  Abdomen: Soft/+BS, non tender, non distended, no guarding; no hepatosplenomegaly   Extremities: No edema, No lymphangitis, No petechiae, No rashes, no synovitis. Right Foot in bandage.  Data Reviewed: Basic Metabolic Panel:  Recent Labs Lab 11/27/14 1643  11/29/14 0411 11/30/14 0505 12/01/14 0415 12/02/14 0359 12/03/14 0635  NA  --   < > 139 140 141 141 140  K  --   < > 3.4* 4.1 4.2 3.8 3.7  CL  --   < > 105 108 109 107 107  CO2  --   < > _0 GLUCOSE  --   < > 178* 145* 110* 144* 113*  BUN  --   < > _1 CREATININE  --   < > 1.80* 1.74* 1.55* 1.48* 1.40*  CALCIUM  --   < > 8.2* 8.5* 8.3* 8.5* 8.6*  MG 1.7  --  1.8 2.0  --   --   --   < > = values in this interval not displayed. Liver Function Tests: No results for input(s): AST, ALT, ALKPHOS, BILITOT, PROT, ALBUMIN in the last 168 hours. No results for input(s): LIPASE, AMYLASE in the last 168 hours. No results for  input(s): AMMONIA in the last 168 hours. CBC:  Recent Labs Lab 11/26/14 1128  11/28/14 0521 11/29/14 0411 11/30/14 0505 12/01/14 0415 12/02/14 0359  WBC 8.7  < > 10.5 13.2* 13.4* 10.2 10.3  NEUTROABS 5.8  --   --   --   --   --   --   HGB 11.3*  < > 9.8* 9.5* 9.8* 8.8* 8.7*  HCT 33.8*  < > 30.2* 29.5* 30.7* 28.2* 27.7*  MCV 88.5  < > 89.1 89.7 91.4 91.6 90.5  PLT 328  < > 285 295 302 281 327  < > = values in this interval not displayed. Cardiac Enzymes:  Recent Labs Lab 11/30/14 0505 11/30/14 1046 11/30/14 1644  TROPONINI 0.04* 0.05* 0.05*   BNP: Invalid input(s): POCBNP CBG:  Recent Labs Lab 12/02/14 1246 12/02/14 1703 12/02/14 1958 12/03/14 0013 12/03/14 0455  GLUCAP 105* 150* 165* 87 140*  Recent Results (from the past 240 hour(s))  Wound culture     Status: None   Collection Time: 11/26/14 11:11 AM  Result Value Ref Range Status   Specimen Description WOUND RIGHT FOOT  Final   Special Requests Immunocompromised  Final   Gram Stain   Final    RARE WBC PRESENT, PREDOMINANTLY PMN NO SQUAMOUS EPITHELIAL CELLS SEEN MODERATE GRAM POSITIVE COCCI IN PAIRS Performed at [facility]    Culture   Final    MODERATE STAPHYLOCOCCUS AUREUS Note: RIFAMPIN AND GENTAMICIN SHOULD NOT BE USED AS SINGLE DRUGS FOR TREATMENT OF STAPH INFECTIONS. ABUNDANT GROUP B STREP(S.AGALACTIAE)ISOLATED Note: TESTING AGAINST S. AGALACTIAE NOT ROUTINELY PERFORMED DUE TO PREDICTABILITY OF AMP/PEN/VAN SUSCEPTIBILITY. Performed at [facility]    Report Status 11/30/2014 FINAL  Final   Organism ID, Bacteria STAPHYLOCOCCUS AUREUS  Final      Susceptibility   Staphylococcus aureus - MIC*    CLINDAMYCIN <=0.25 SENSITIVE Sensitive     ERYTHROMYCIN <=0.25 SENSITIVE Sensitive     GENTAMICIN <=0.5 SENSITIVE Sensitive     LEVOFLOXACIN <=0.12 SENSITIVE Sensitive     OXACILLIN <=0.25 SENSITIVE Sensitive     RIFAMPIN <=0.5 SENSITIVE Sensitive     TRIMETH/SULFA <=10 SENSITIVE  Sensitive     VANCOMYCIN <=0.5 SENSITIVE Sensitive     TETRACYCLINE <=1 SENSITIVE Sensitive     MOXIFLOXACIN <=0.25 SENSITIVE Sensitive     * MODERATE STAPHYLOCOCCUS AUREUS  Surgical pcr screen     Status: None   Collection Time: 11/28/14  8:43 AM  Result Value Ref Range Status   MRSA, PCR NEGATIVE NEGATIVE Final   Staphylococcus aureus NEGATIVE NEGATIVE Final    Comment:        The Xpert SA Assay (FDA approved for NASAL specimens in patients over 65 years of age), is one component of a comprehensive surveillance program.  Test performance has been validated by [facility] for patients greater than or equal to 76 year old. It is not intended to diagnose infection nor to guide or monitor treatment.   Anaerobic culture     Status: None (Preliminary result)   Collection Time: 11/28/14  4:01 PM  Result Value Ref Range Status   Specimen Description WOUND RIGHT TOE  Final   Special Requests NONE  Final   Gram Stain   Final    RARE WBC PRESENT, PREDOMINANTLY PMN NO SQUAMOUS EPITHELIAL CELLS SEEN MODERATE GRAM POSITIVE COCCI IN PAIRS Performed at [facility]    Culture   Final    NO ANAEROBES ISOLATED; CULTURE IN PROGRESS FOR 5 DAYS Performed at [facility]    Report Status PENDING  Incomplete  Fungus Culture with Smear     Status: None (Preliminary result)   Collection Time: 11/28/14  4:01 PM  Result Value Ref Range Status   Specimen Description WOUND RIGHT TOE  Final   Special Requests NONE  Final   Fungal Smear   Final    NO YEAST OR FUNGAL ELEMENTS SEEN Performed at [facility]    Culture   Final    CULTURE IN PROGRESS FOR FOUR WEEKS Performed at [facility]    Report Status PENDING  Incomplete  Wound culture     Status: None   Collection Time: 11/28/14  4:01 PM  Result Value Ref Range Status   Specimen Description WOUND RIGHT TOE  Final   Special Requests NONE  Final   Gram Stain   Final    RARE WBC  PRESENT, PREDOMINANTLY  PMN NO SQUAMOUS EPITHELIAL CELLS SEEN MODERATE GRAM POSITIVE COCCI IN PAIRS Performed at [facility]    Culture   Final    MODERATE STAPHYLOCOCCUS AUREUS Note: RIFAMPIN AND GENTAMICIN SHOULD NOT BE USED AS SINGLE DRUGS FOR TREATMENT OF STAPH INFECTIONS. REPORT FAXED BY REQUEST MODERATE GROUP B STREP(S.AGALACTIAE)ISOLATED Note: TESTING AGAINST S. AGALACTIAE NOT ROUTINELY PERFORMED DUE TO PREDICTABILITY OF AMP/PEN/VAN SUSCEPTIBILITY. Performed at [facility]    Report Status 12/02/2014 FINAL  Final   Organism ID, Bacteria STAPHYLOCOCCUS AUREUS  Final      Susceptibility   Staphylococcus aureus - MIC*    CLINDAMYCIN <=0.25 SENSITIVE Sensitive     ERYTHROMYCIN <=0.25 SENSITIVE Sensitive     GENTAMICIN <=0.5 SENSITIVE Sensitive     LEVOFLOXACIN <=0.12 SENSITIVE Sensitive     OXACILLIN <=0.25 SENSITIVE Sensitive     RIFAMPIN <=0.5 SENSITIVE Sensitive     TRIMETH/SULFA <=10 SENSITIVE Sensitive     VANCOMYCIN 1 SENSITIVE Sensitive     TETRACYCLINE <=1 SENSITIVE Sensitive     MOXIFLOXACIN <=0.25 SENSITIVE Sensitive     * MODERATE STAPHYLOCOCCUS AUREUS  AFB culture with smear     Status: None (Preliminary result)   Collection Time: 11/28/14  4:01 PM  Result Value Ref Range Status   Specimen Description WOUND RIGHT TOE  Final   Special Requests NONE  Final   Acid Fast Smear   Final    NO ACID FAST BACILLI SEEN Performed at [facility]    Culture   Final    CULTURE WILL BE EXAMINED FOR 6 WEEKS BEFORE ISSUING A FINAL REPORT Performed at [facility]    Report Status PENDING  Incomplete  Culture, Urine     Status: None   Collection Time: 11/29/14 12:31 PM  Result Value Ref Range Status   Specimen Description URINE, CLEAN CATCH  Final   Special Requests NONE  Final   Culture 30,000 COLONIES/mL YEAST  Final   Report Status 12/01/2014 FINAL  Final     Scheduled Meds: . amLODipine  5 mg Oral Daily  . amoxicillin-clavulanate  1 tablet Oral Q12H  .  atorvastatin  10 mg Oral Daily  . carvedilol  25 mg Oral BID WC  . enoxaparin (LOVENOX) injection  40 mg Subcutaneous Q24H  . hydrALAZINE  25 mg Oral Once  . hydrALAZINE  75 mg Oral 3 times per day  . insulin aspart  0-15 Units Subcutaneous 6 times per day  . insulin glargine  5 Units Subcutaneous QHS  . loratadine  10 mg Oral Daily  . nicotine  21 mg Transdermal Daily   Continuous Infusions:     Benn Tarver, MD  [facility] Pager [phone]  Time spent: 35 minutes   If 7PM-7AM, please contact night-coverage [email or web address] Password [facility] 12/03/2014, 9:53 AM   LOS: 7 days

## 2014-12-13 ENCOUNTER — Encounter: Payer: Self-pay | Admitting: Family

## 2014-12-13 ENCOUNTER — Other Ambulatory Visit (INDEPENDENT_AMBULATORY_CARE_PROVIDER_SITE_OTHER): Payer: 59

## 2014-12-13 ENCOUNTER — Ambulatory Visit (INDEPENDENT_AMBULATORY_CARE_PROVIDER_SITE_OTHER): Payer: 59 | Admitting: Family

## 2014-12-13 DIAGNOSIS — I10 Essential (primary) hypertension: Secondary | ICD-10-CM

## 2014-12-13 DIAGNOSIS — E1151 Type 2 diabetes mellitus with diabetic peripheral angiopathy without gangrene: Secondary | ICD-10-CM | POA: Diagnosis not present

## 2014-12-13 DIAGNOSIS — I96 Gangrene, not elsewhere classified: Secondary | ICD-10-CM | POA: Diagnosis not present

## 2014-12-13 DIAGNOSIS — E1159 Type 2 diabetes mellitus with other circulatory complications: Secondary | ICD-10-CM

## 2014-12-13 LAB — BASIC METABOLIC PANEL
BUN: 18 mg/dL (ref 6–23)
CO2: 27 mEq/L (ref 19–32)
Calcium: 9.5 mg/dL (ref 8.4–10.5)
Chloride: 104 mEq/L (ref 96–112)
Creatinine, Ser: 1.25 mg/dL — ABNORMAL HIGH (ref 0.40–1.20)
GFR: 46.75 mL/min — ABNORMAL LOW (ref >60.00)
Glucose, Bld: 134 mg/dL — ABNORMAL HIGH (ref 70–99)
Potassium: 4.1 mEq/L (ref 3.5–5.1)
Sodium: 141 mEq/L (ref 135–145)

## 2014-12-13 MED ORDER — AMLODIPINE BESYLATE 10 MG PO TABS: 10.0000 mg | ORAL_TABLET | Freq: Every day | ORAL | Status: DC

## 2014-12-13 MED ORDER — OXYCODONE-ACETAMINOPHEN 5-325 MG PO TABS: 1.0000 | ORAL_TABLET | Freq: Three times a day (TID) | ORAL | Status: DC | PRN

## 2014-12-13 NOTE — Patient Instructions (Addendum)
Thank you for choosing [facility].  Summary/Instructions:  Increase your amlodipine to 10 mg daily.  Continue to take your blood sugars at home and medications as prescribed.   Your prescription(s) have been submitted to your pharmacy or been printed and provided for you. Please take as directed and contact our office if you believe you are having problem(s) with the medication(s) or have any questions.  Please stop by the lab on the basement level of the building for your blood work. Your results will be released to MyChart (or called to you) after review, usually within 72 hours after test completion. If any changes need to be made, you will be notified at that same time.  If your symptoms worsen or fail to improve, please contact our office for further instruction, or in case of emergency go directly to the emergency room at the closest medical facility.
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
## 2014-12-13 NOTE — Assessment & Plan Note (Signed)
Appears stable post surgery with no evidence of infection and continuing therapy with Augmentin as prescribed. Continue wound care as instructed and follow up with vascular surgery as scheduled.

## 2014-12-13 NOTE — Assessment & Plan Note (Signed)
Blood pressure remains uncontrolled with current regimen and patient indicates compliance with the regimen and no adverse side effects. Increase amlodipine and continue current dosage of hydralazine and carvedilol. Monitor blood pressure at home and follow up in 1 month.

## 2014-12-13 NOTE — Progress Notes (Signed)
Pre visit review using our clinic review tool, if applicable. No additional management support is needed unless otherwise documented below in the visit note. 

## 2014-12-13 NOTE — Assessment & Plan Note (Signed)
Diabetes appears to be better controlled with twice daily dosage of 75/25 and compliance by patient. Spent significant time discussing the importance of blood sugar control on wound healing and how compliance with regimen can significantly improve potential outcomes. Continue current dosage of insulin 75/25. Continue to monitor blood sugar at home follow up for A1c in 1 month. Obtain BMET to check kidney function.

## 2014-12-19 ENCOUNTER — Encounter: Payer: Self-pay | Admitting: Vascular Surgery

## 2014-12-20 ENCOUNTER — Ambulatory Visit (INDEPENDENT_AMBULATORY_CARE_PROVIDER_SITE_OTHER): Payer: Self-pay | Admitting: Vascular Surgery

## 2014-12-20 ENCOUNTER — Encounter: Payer: Self-pay | Admitting: Vascular Surgery

## 2014-12-20 VITALS — BP 127/79 | HR 83 | Temp 98.3°F | Resp 16 | Ht 64.0 in | Wt 180.0 lb

## 2014-12-20 DIAGNOSIS — L97511 Non-pressure chronic ulcer of other part of right foot limited to breakdown of skin: Secondary | ICD-10-CM

## 2014-12-20 DIAGNOSIS — Z48812 Encounter for surgical aftercare following surgery on the circulatory system: Secondary | ICD-10-CM

## 2014-12-20 DIAGNOSIS — L97509 Non-pressure chronic ulcer of other part of unspecified foot with unspecified severity: Secondary | ICD-10-CM | POA: Insufficient documentation

## 2014-12-20 NOTE — Progress Notes (Signed)
Patient name: Shirley Chen MRN: [id number] DOB: 1955/06/06 Sex: female  REASON FOR VISIT: Follow up after ray amputation of the right great toe.  HPI: Shirley Chen is a 59 y.o. female who underwent a right femoral to anterior tibial artery bypass with a vein graft on 10/27/2014. Subsequently she had ray amputation of the right great toe on 11/28/2014. She comes in for a follow up visit. Her daughter has been doing the dressing change. She has no specific complaints. She has quit smoking. She denies fever or chills.  Current Outpatient Prescriptions  Medication Sig Dispense Refill  . amLODipine (NORVASC) 10 MG tablet Take 1 tablet (10 mg total) by mouth daily. 30 tablet 2  . aspirin EC 81 MG tablet Take 1 tablet (81 mg total) by mouth daily. 30 tablet 0  . atorvastatin (LIPITOR) 10 MG tablet Take 1 tablet (10 mg total) by mouth daily. 30 tablet 0  . carvedilol (COREG) 25 MG tablet Take 1 tablet (25 mg total) by mouth 2 (two) times daily with a meal. 62 tablet 0  . glucose monitoring kit (FREESTYLE) monitoring kit 1 each by Does not apply route 4 (four) times daily - after meals and at bedtime. 1 month Diabetic Testing Supplies for QAC-QHS accuchecks. Any brand OK. Diagnosis E11.65 1 each 1  . hydrALAZINE (APRESOLINE) 25 MG tablet Take 3 tablets (75 mg total) by mouth 3 (three) times daily. 270 tablet 0  . insulin lispro protamine-lispro (HUMALOG 75/25 MIX) (75-25) 100 UNIT/ML SUSP injection Inject 10 Units into the skin 2 (two) times daily with a meal. Dx E11.65, can switch to any approved brand 10 mL 0  . Melatonin 10 MG TBDP Take 20 mg by mouth at bedtime.    . nicotine (NICODERM CQ - DOSED IN MG/24 HOURS) 21 mg/24hr patch Place 1 patch (21 mg total) onto the skin daily. 21 patch 0  . oxyCODONE-acetaminophen (PERCOCET/ROXICET) 5-325 MG per tablet Take 1 tablet by mouth every 8 (eight) hours as needed for severe pain. 30 tablet 0  . Vitamin D, Ergocalciferol, (DRISDOL) 50000 UNITS CAPS  capsule Take 1 capsule (50,000 Units total) by mouth every 7 (seven) days. 12 capsule 0  . docusate sodium (COLACE) 100 MG capsule Take 1 capsule (100 mg total) by mouth 2 (two) times daily. (Patient not taking: Reported on 12/20/2014) 10 capsule 0  . Insulin Syringe-Needle U-100 25G X 1" 1 ML MISC For 4 times a day insulin SQ, 1 month supply. Diagnosis E11.65 (Patient not taking: Reported on 12/20/2014) 30 each 0  . loratadine (CLARITIN) 10 MG tablet Take 1 tablet (10 mg total) by mouth daily. (Patient not taking: Reported on 12/20/2014)     No current facility-administered medications for this visit.   REVIEW OF SYSTEMS: Valu.Nieves ] denotes positive finding; [  ] denotes negative finding  CARDIOVASCULAR:  '[ ]'  chest pain   '[ ]'  dyspnea on exertion    CONSTITUTIONAL:  '[ ]'  fever   '[ ]'  chills  PHYSICAL EXAM: Filed Vitals:   12/20/14 1023  BP: 127/79  Pulse: 83  Temp: 98.3 F (36.8 C)  TempSrc: Oral  Resp: 16  Height: '5\' 4"'  (1.626 m)  Weight: 180 lb (81.647 kg)  SpO2: 97%   GENERAL: The patient is a well-nourished female, in no acute distress. The vital signs are documented above. CARDIOVASCULAR: There is a regular rate and rhythm. PULMONARY: There is good air exchange bilaterally without wheezing or rales. Her incisions in the right leg are all  healing nicely. She has a palpable graft pulse along the lateral aspect of her right leg. Her toe amputation site is healing nicely. There is one small area that is opened which required dressing changes. I removed her  Sutures in the office today.  MEDICAL ISSUES:  STATUS POST RIGHT FEMORAL TO ANTERIOR TIBIAL ARTERY BYPASS AND RAY AMPUTATION OF THE RIGHT GREAT TOE FOR ATHEROSCLEROSIS WITH OSTEOMYELITIS AND GANGRENE OF THE RIGHT FOOT: Her bypass graft is patent and her toe amputation site is healing nicely. Her daughter will continue daily dressing changes. I've encouraged her to continue to be gentle with the foot and I'll plan on seeing her back in 2  weeks. She knows to call sooner if she has problems.  Deitra Mayo [facility] [place]: [phone]

## 2014-12-23 LAB — FUNGUS CULTURE W SMEAR: Fungal Smear: NONE SEEN

## 2014-12-25 ENCOUNTER — Telehealth: Payer: Self-pay | Admitting: *Deleted

## 2014-12-25 NOTE — Telephone Encounter (Signed)
Receive call pt states she is needing refill on her oxycodone. Inform pt she just got refill on 12/13/14, and she is not due until 01/12/15. She states yes I know but I'm out. Inform her again refill is too soon...[name]

## 2014-12-26 DIAGNOSIS — Z0279 Encounter for issue of other medical certificate: Secondary | ICD-10-CM | POA: Diagnosis not present

## 2015-01-01 ENCOUNTER — Encounter: Payer: Self-pay | Admitting: Vascular Surgery

## 2015-01-01 ENCOUNTER — Telehealth: Payer: Self-pay | Admitting: Family

## 2015-01-01 MED ORDER — GLUCOSE BLOOD VI STRP: ORAL_STRIP | Status: DC

## 2015-01-01 MED ORDER — CARVEDILOL 25 MG PO TABS: 25.0000 mg | ORAL_TABLET | Freq: Two times a day (BID) | ORAL | Status: DC

## 2015-01-01 MED ORDER — "INSULIN SYRINGE-NEEDLE U-100 25G X 1"" 1 ML MISC": Status: DC

## 2015-01-01 MED ORDER — ATORVASTATIN CALCIUM 10 MG PO TABS: 10.0000 mg | ORAL_TABLET | Freq: Every day | ORAL | Status: DC

## 2015-01-01 NOTE — Telephone Encounter (Signed)
Verified pharmacy is [facility] on [address]. Patient requesting the following sent in...  carvedilol (COREG) 25 MG tablet [[id number]]  atorvastatin (LIPITOR) 10 MG tablet [[id number]]  Needles for humalog 2575 Test strips- freestyle

## 2015-01-01 NOTE — Telephone Encounter (Signed)
Sent in. Pt aware.

## 2015-01-03 ENCOUNTER — Ambulatory Visit (INDEPENDENT_AMBULATORY_CARE_PROVIDER_SITE_OTHER): Payer: 59 | Admitting: Vascular Surgery

## 2015-01-03 ENCOUNTER — Encounter: Payer: Self-pay | Admitting: Vascular Surgery

## 2015-01-03 VITALS — BP 131/76 | HR 74 | Temp 97.8°F | Resp 16 | Ht 64.0 in | Wt 176.0 lb

## 2015-01-03 DIAGNOSIS — I70269 Atherosclerosis of native arteries of extremities with gangrene, unspecified extremity: Secondary | ICD-10-CM

## 2015-01-03 NOTE — Progress Notes (Signed)
Patient name: Shirley Chen MRN: [id number] DOB: Aug 07, 1955 Sex: female  REASON FOR VISIT: follow up after ray amputation of the right great toe.  HPI: Shirley Chen is a 59 y.o. female who underwent a right femoral to anterior tibial artery bypass with a vein graft on 10/27/2014. She subsequently had a ray amputation of the right great toe on 11/28/2014. When I saw her last on 12/20/2014, her bypass graft was patent and her toe amputation site was healing nicely. She comes in for a 2 week follow up visit.  Since I saw her last, her daughter continues to do the dressing changes. She has no specific complaints. She has successfully quit smoking.  Current Outpatient Prescriptions  Medication Sig Dispense Refill  . amLODipine (NORVASC) 10 MG tablet Take 1 tablet (10 mg total) by mouth daily. 30 tablet 2  . aspirin EC 81 MG tablet Take 1 tablet (81 mg total) by mouth daily. 30 tablet 0  . atorvastatin (LIPITOR) 10 MG tablet Take 1 tablet (10 mg total) by mouth daily. 30 tablet 0  . carvedilol (COREG) 25 MG tablet Take 1 tablet (25 mg total) by mouth 2 (two) times daily with a meal. 62 tablet 0  . glucose blood (FREESTYLE TEST STRIPS) test strip Use as instructed 100 each 12  . glucose monitoring kit (FREESTYLE) monitoring kit 1 each by Does not apply route 4 (four) times daily - after meals and at bedtime. 1 month Diabetic Testing Supplies for QAC-QHS accuchecks. Any brand OK. Diagnosis E11.65 1 each 1  . hydrALAZINE (APRESOLINE) 25 MG tablet Take 3 tablets (75 mg total) by mouth 3 (three) times daily. 270 tablet 0  . insulin lispro protamine-lispro (HUMALOG 75/25 MIX) (75-25) 100 UNIT/ML SUSP injection Inject 10 Units into the skin 2 (two) times daily with a meal. Dx E11.65, can switch to any approved brand 10 mL 0  . Insulin Syringe-Needle U-100 25G X 1" 1 ML MISC For 4 times a day insulin SQ, 1 month supply. Diagnosis E11.65 30 each 0  . Melatonin 10 MG TBDP Take 20 mg by mouth at bedtime.      . nicotine (NICODERM CQ - DOSED IN MG/24 HOURS) 21 mg/24hr patch Place 1 patch (21 mg total) onto the skin daily. 21 patch 0  . Vitamin D, Ergocalciferol, (DRISDOL) 50000 UNITS CAPS capsule Take 1 capsule (50,000 Units total) by mouth every 7 (seven) days. 12 capsule 0  . docusate sodium (COLACE) 100 MG capsule Take 1 capsule (100 mg total) by mouth 2 (two) times daily. (Patient not taking: Reported on 12/20/2014) 10 capsule 0  . loratadine (CLARITIN) 10 MG tablet Take 1 tablet (10 mg total) by mouth daily. (Patient not taking: Reported on 12/20/2014)    . oxyCODONE-acetaminophen (PERCOCET/ROXICET) 5-325 MG per tablet Take 1 tablet by mouth every 8 (eight) hours as needed for severe pain. (Patient not taking: Reported on 01/03/2015) 30 tablet 0   No current facility-administered medications for this visit.   REVIEW OF SYSTEMS: Valu.Nieves ] denotes positive finding; [  ] denotes negative finding  CARDIOVASCULAR:  _0  chest pain   _1  dyspnea on exertion    CONSTITUTIONAL:  _2  fever   _3  chills  PHYSICAL EXAM: Filed Vitals:   01/03/15 1200  BP: 131/76  Pulse: 74  Temp: 97.8 F (36.6 C)  Resp: 16  Height: _4  (1.626 m)  Weight: 176 lb (79.833 kg)  SpO2: 99%   GENERAL: The patient is a well-nourished  female, in no acute distress. The vital signs are documented above. CARDIOVASCULAR: There is a regular rate and rhythm. PULMONARY: There is good air exchange bilaterally without wheezing or rales. Her bypass graft has an excellent pulse. The wound on the medial aspect of the right great toe amputation site measures 15 mm x 7 mm with reasonable granulation tissue.  MEDICAL ISSUES: The wounds are right foot continue to heal gradually. Her bypass graft is patent. I'll see her back in one month. The daughter will continue the dressing changes. She knows to call sooner if he has problems.  Deitra Mayo [facility] [place]: [phone]

## 2015-01-10 LAB — AFB CULTURE WITH SMEAR (NOT AT ARMC): Acid Fast Smear: NONE SEEN

## 2015-01-11 ENCOUNTER — Other Ambulatory Visit: Payer: Self-pay | Admitting: Family

## 2015-01-11 ENCOUNTER — Telehealth: Payer: Self-pay | Admitting: Family

## 2015-01-11 DIAGNOSIS — I96 Gangrene, not elsewhere classified: Secondary | ICD-10-CM

## 2015-01-11 MED ORDER — HYDRALAZINE HCL 25 MG PO TABS: 75.0000 mg | ORAL_TABLET | Freq: Three times a day (TID) | ORAL | Status: DC

## 2015-01-11 MED ORDER — OXYCODONE-ACETAMINOPHEN 5-325 MG PO TABS: 1.0000 | ORAL_TABLET | Freq: Three times a day (TID) | ORAL | Status: DC | PRN

## 2015-01-11 NOTE — Telephone Encounter (Signed)
Last refill was 9/7 on oxycodone and hydralazine was 8/28

## 2015-01-11 NOTE — Telephone Encounter (Signed)
Pt called stated she also need the freestyle litetest strip and relyon needle 31 gage 8 millimeter to be send to [facility], she is out of these two.

## 2015-01-11 NOTE — Telephone Encounter (Signed)
Patient is requesting refills for hydrALAZINE (APRESOLINE) 25 MG tablet [[id number]] and oxyCODONE-acetaminophen (PERCOCET/ROXICET) 5-325 MG per tablet [[id number]]  Pharmacy for first one is [facility] on [address] She wants to pick up today, please

## 2015-01-11 NOTE — Telephone Encounter (Signed)
Medications refilled. LVM letting pt know medication ready for pick up

## 2015-01-12 MED ORDER — NEEDLE (DISP) 31G X 5/16" MISC
1.0000 | Freq: Every day | Status: DC | PRN
Start: 2015-01-12 — End: 2016-09-08

## 2015-01-12 NOTE — Telephone Encounter (Signed)
Erx for the needle sent.   rx for test strips done on 01/01/2015 with refills. Pt should contact pharmacy for them to fill.

## 2015-01-15 ENCOUNTER — Encounter: Payer: Self-pay | Admitting: Family

## 2015-01-15 ENCOUNTER — Ambulatory Visit (INDEPENDENT_AMBULATORY_CARE_PROVIDER_SITE_OTHER): Payer: 59 | Admitting: Family

## 2015-01-15 VITALS — BP 124/62 | HR 78 | Temp 97.8°F | Ht 64.0 in | Wt 185.0 lb

## 2015-01-15 DIAGNOSIS — I96 Gangrene, not elsewhere classified: Secondary | ICD-10-CM | POA: Diagnosis not present

## 2015-01-15 DIAGNOSIS — L089 Local infection of the skin and subcutaneous tissue, unspecified: Secondary | ICD-10-CM

## 2015-01-15 DIAGNOSIS — E1169 Type 2 diabetes mellitus with other specified complication: Secondary | ICD-10-CM | POA: Diagnosis not present

## 2015-01-15 DIAGNOSIS — E11628 Type 2 diabetes mellitus with other skin complications: Secondary | ICD-10-CM

## 2015-01-15 DIAGNOSIS — I10 Essential (primary) hypertension: Secondary | ICD-10-CM

## 2015-01-15 MED ORDER — ATORVASTATIN CALCIUM 10 MG PO TABS: 10.0000 mg | ORAL_TABLET | Freq: Every day | ORAL | Status: DC

## 2015-01-15 MED ORDER — RELION PEN NEEDLES 31G X 8 MM MISC: Status: DC

## 2015-01-15 MED ORDER — AMLODIPINE BESYLATE 10 MG PO TABS: 10.0000 mg | ORAL_TABLET | Freq: Every day | ORAL | Status: DC

## 2015-01-15 MED ORDER — VITAMIN D (ERGOCALCIFEROL) 1.25 MG (50000 UNIT) PO CAPS: 50000.0000 [IU] | ORAL_CAPSULE | ORAL | Status: DC

## 2015-01-15 MED ORDER — INSULIN LISPRO PROT & LISPRO (75-25 MIX) 100 UNIT/ML KWIKPEN
10.0000 [IU] | PEN_INJECTOR | Freq: Two times a day (BID) | SUBCUTANEOUS | Status: DC
Start: 2015-01-15 — End: 2015-06-27

## 2015-01-15 MED ORDER — GLUCOSE BLOOD VI STRP: ORAL_STRIP | Status: DC

## 2015-01-15 NOTE — Assessment & Plan Note (Addendum)
Diabetes appears improved with current regimen and home numbers. Maintained on statin. Foot exam deferred pending surgical evaluation of right great toe amputation. Discussed establishing for eye exam. Will hold Pneuvax for next visit. A1c and BMET in 2 months to determine current status and improved control.

## 2015-01-15 NOTE — Assessment & Plan Note (Signed)
Well controlled with current regimen without adverse side effects or hypotensive events. Encouraged to monitor blood pressure at home periodically. Continue current dosage of hydralazine, amlodipine and the carvedilol.

## 2015-01-15 NOTE — Patient Instructions (Addendum)
Thank you for choosing [facility].  Summary/Instructions:  Continue to take your medications as prescribed.  Continue to monitor blood sugars 2x per day.   Your prescription(s) have been submitted to your pharmacy or been printed and provided for you. Please take as directed and contact our office if you believe you are having problem(s) with the medication(s) or have any questions.  Please stop by the lab on the basement level of the building for your blood work in December. Your results will be released to MyChart (or called to you) after review, usually within 72 hours after test completion. If any changes need to be made, you will be notified at that same time.  If your symptoms worsen or fail to improve, please contact our office for further instruction, or in case of emergency go directly to the emergency room at the closest medical facility.

## 2015-01-15 NOTE — Progress Notes (Signed)
Pre visit review using our clinic review tool, if applicable. No additional management support is needed unless otherwise documented below in the visit note. 

## 2015-01-15 NOTE — Assessment & Plan Note (Signed)
Stable and continued treatment per vascular surgery.
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
## 2015-01-29 ENCOUNTER — Telehealth: Payer: Self-pay | Admitting: *Deleted

## 2015-01-29 DIAGNOSIS — I96 Gangrene, not elsewhere classified: Secondary | ICD-10-CM

## 2015-01-29 MED ORDER — OXYCODONE-ACETAMINOPHEN 5-325 MG PO TABS: 1.0000 | ORAL_TABLET | Freq: Two times a day (BID) | ORAL | Status: DC | PRN

## 2015-01-29 NOTE — Telephone Encounter (Signed)
Receive call pt states she did not get refill on her generic Lipitor & Coreg. Inform pt Greg sent Lipitor on 01/15/15 will resend. Also pt states she need a refill on her oxycodone. She states she talk with Tammy SoursGreg about quantity not being enough. Pls advise...[name]/lmb

## 2015-01-29 NOTE — Telephone Encounter (Signed)
Medication available for pick up. 

## 2015-01-30 ENCOUNTER — Telehealth: Payer: Self-pay | Admitting: *Deleted

## 2015-01-30 DIAGNOSIS — E11628 Type 2 diabetes mellitus with other skin complications: Secondary | ICD-10-CM

## 2015-01-30 DIAGNOSIS — L089 Local infection of the skin and subcutaneous tissue, unspecified: Principal | ICD-10-CM

## 2015-01-30 MED ORDER — CARVEDILOL 25 MG PO TABS: 25.0000 mg | ORAL_TABLET | Freq: Two times a day (BID) | ORAL | Status: DC

## 2015-01-30 MED ORDER — ATORVASTATIN CALCIUM 10 MG PO TABS: 10.0000 mg | ORAL_TABLET | Freq: Every day | ORAL | Status: DC

## 2015-01-30 NOTE — Telephone Encounter (Signed)
Notified pt rx ready for pick-up.../lmb 

## 2015-01-30 NOTE — Telephone Encounter (Signed)
Receive call pt states pharmacy doesn't have any of her prescriptions. Inform pt the oxcycodone has to be pick up from the office. Verified the other rx's she states she need refills on coreg & lipitor. Inform to resend to [facility]...[name]/lmb

## 2015-02-05 ENCOUNTER — Encounter: Payer: Self-pay | Admitting: Vascular Surgery

## 2015-02-07 ENCOUNTER — Encounter: Payer: Self-pay | Admitting: Vascular Surgery

## 2015-02-07 ENCOUNTER — Ambulatory Visit (INDEPENDENT_AMBULATORY_CARE_PROVIDER_SITE_OTHER): Payer: 59 | Admitting: Vascular Surgery

## 2015-02-07 VITALS — BP 138/64 | HR 81 | Temp 97.7°F | Ht 64.0 in | Wt 188.6 lb

## 2015-02-07 DIAGNOSIS — I7025 Atherosclerosis of native arteries of other extremities with ulceration: Secondary | ICD-10-CM

## 2015-02-07 NOTE — Progress Notes (Signed)
[facility] PATIENT:  Patient name: Shirley Chen MRN: [id number] DOB: Dec 30, 1955 Sex: female  REASON FOR VISIT: follow up of ray amputation of right great toe  HPI: Shirley Chen is a 59 y.o. female who underwent a right femoral to anterior tibial artery bypass with a vein graft on 10/27/2014. She subtotally had arranged amputation of her right great toe on 11/28/2014. When I last saw her on 01/03/2015 the wound on the medial aspect of her right great toe measured 15 mm x 7 mm with reasonable granulation tissue. She comes in for a 1 month follow up visit.  She has no specific complaints. She is not a smoker. She is on aspirin and is on a statin.  Current Outpatient Prescriptions  Medication Sig Dispense Refill  . amLODipine (NORVASC) 10 MG tablet Take 1 tablet (10 mg total) by mouth daily. 90 tablet 1  . aspirin EC 81 MG tablet Take 1 tablet (81 mg total) by mouth daily. 30 tablet 0  . atorvastatin (LIPITOR) 10 MG tablet Take 1 tablet (10 mg total) by mouth daily. 90 tablet 1  . carvedilol (COREG) 25 MG tablet Take 1 tablet (25 mg total) by mouth 2 (two) times daily with a meal. 180 tablet 1  . docusate sodium (COLACE) 100 MG capsule Take 1 capsule (100 mg total) by mouth 2 (two) times daily. 10 capsule 0  . glucose blood (FREESTYLE TEST STRIPS) test strip Use 1 strip per test to test blood sugars 1-3 times daily. 100 each 3  . glucose monitoring kit (FREESTYLE) monitoring kit 1 each by Does not apply route 4 (four) times daily - after meals and at bedtime. 1 month Diabetic Testing Supplies for QAC-QHS accuchecks. Any brand OK. Diagnosis E11.65 1 each 1  . hydrALAZINE (APRESOLINE) 25 MG tablet Take 3 tablets (75 mg total) by mouth 3 (three) times daily. 270 tablet 0  . Insulin Lispro Prot & Lispro (HUMALOG 75/25 MIX) (75-25) 100 UNIT/ML Kwikpen Inject 10 Units into the skin 2 (two) times daily. 15 mL 11  . Insulin Syringe-Needle U-100 25G X 1" 1 ML MISC For 4 times a day insulin SQ, 1 month  supply. Diagnosis E11.65 30 each 0  . loratadine (CLARITIN) 10 MG tablet Take 1 tablet (10 mg total) by mouth daily.    . Melatonin 10 MG TBDP Take 20 mg by mouth at bedtime.    . Needle, Disp, 31G X 5/16" MISC Inject 1 application into the skin daily as needed (as directed by your insulin instructions. DX E11.09). 100 each 5  . nicotine (NICODERM CQ - DOSED IN MG/24 HOURS) 21 mg/24hr patch Place 1 patch (21 mg total) onto the skin daily. 21 patch 0  . oxyCODONE-acetaminophen (PERCOCET/ROXICET) 5-325 MG tablet Take 1 tablet by mouth 2 (two) times daily as needed for severe pain. 60 tablet 0  . RELION PEN NEEDLE 31G/8MM 31G X 8 MM MISC Use to administer insulin four times a day 100 each 3  . Vitamin D, Ergocalciferol, (DRISDOL) 50000 UNITS CAPS capsule Take 1 capsule (50,000 Units total) by mouth every 7 (seven) days. 12 capsule 0   No current facility-administered medications for this visit.    REVIEW OF SYSTEMS:  _0  denotes positive finding, _1  denotes negative finding Cardiac  Comments:  Chest pain or chest pressure:    Shortness of breath upon exertion:    Short of breath when lying flat:    Irregular heart rhythm:    Constitutional    Fever  or chills:      PHYSICAL EXAM: Filed Vitals:   02/07/15 0901  BP: 138/64  Pulse: 81  Temp: 97.7 F (36.5 C)  TempSrc: Oral  Height: _0  (1.626 m)  Weight: 188 lb 9.6 oz (85.548 kg)  SpO2: 99%    GENERAL: The patient is a well-nourished female, in no acute distress. The vital signs are documented above. CARDIOVASCULAR: There is a regular rate and rhythm. She has an easily palpable graft pulse in the lateral aspect of her leg. PULMONARY: There is good air exchange bilaterally without wheezing or rales. The wound on her medial heel was debrided. The wound on the posterior heel is gradually improving. The toe amputation site has essentially healed. Her incisions are all healing nicely.  MEDICAL ISSUES:  STATUS POST RIGHT FEMORAL TO  ANTERIOR TIBIAL ARTERY BYPASS AND RIGHT GREAT TOE AMPUTATION: Her graft is patent. The toe amp mutation site has healed. I ordered follow up duplex and ABIs in 3 months and we'll put her on her follow up protocol for her bypass graft. She is not a smoker. She is on aspirin and is on a statin. She knows to call sooner she has problems.  Deitra [facility]: [phone]

## 2015-02-07 NOTE — Addendum Note (Signed)
Addended by: Adria DillELDRIDGE-LEWIS, Petula Rotolo L on: 02/07/2015 02:25 PM   Modules accepted: Orders

## 2015-02-12 ENCOUNTER — Telehealth: Payer: Self-pay | Admitting: *Deleted

## 2015-02-12 MED ORDER — HYDRALAZINE HCL 25 MG PO TABS: 75.0000 mg | ORAL_TABLET | Freq: Three times a day (TID) | ORAL | Status: DC

## 2015-02-12 NOTE — Telephone Encounter (Signed)
Left msg on triage stating needing to get a refill on her hydralazine. Tried calling pt back not able to get through [facility]...[name]/lmb

## 2015-03-07 ENCOUNTER — Telehealth: Payer: Self-pay | Admitting: *Deleted

## 2015-03-07 DIAGNOSIS — I96 Gangrene, not elsewhere classified: Secondary | ICD-10-CM

## 2015-03-07 MED ORDER — OXYCODONE-ACETAMINOPHEN 5-325 MG PO TABS: 1.0000 | ORAL_TABLET | Freq: Two times a day (BID) | ORAL | Status: DC | PRN

## 2015-03-07 NOTE — Telephone Encounter (Signed)
Left msg on triage requesting refill on her pain med...[name]/lmb

## 2015-03-07 NOTE — Telephone Encounter (Signed)
Medication refilled - need OV for next refill.

## 2015-03-07 NOTE — Telephone Encounter (Signed)
Notified pt rx ready for pick-up.../lmb 

## 2015-03-09 ENCOUNTER — Other Ambulatory Visit (INDEPENDENT_AMBULATORY_CARE_PROVIDER_SITE_OTHER): Payer: 59

## 2015-03-09 DIAGNOSIS — L089 Local infection of the skin and subcutaneous tissue, unspecified: Secondary | ICD-10-CM

## 2015-03-09 DIAGNOSIS — E11628 Type 2 diabetes mellitus with other skin complications: Secondary | ICD-10-CM

## 2015-03-09 DIAGNOSIS — E1169 Type 2 diabetes mellitus with other specified complication: Secondary | ICD-10-CM

## 2015-03-09 LAB — BASIC METABOLIC PANEL
BUN: 31 mg/dL — ABNORMAL HIGH (ref 6–23)
CO2: 29 mEq/L (ref 19–32)
Calcium: 9.4 mg/dL (ref 8.4–10.5)
Chloride: 104 mEq/L (ref 96–112)
Creatinine, Ser: 1.22 mg/dL — ABNORMAL HIGH (ref 0.40–1.20)
GFR: 48.04 mL/min — ABNORMAL LOW (ref >60.00)
Glucose, Bld: 287 mg/dL — ABNORMAL HIGH (ref 70–99)
Potassium: 4.5 mEq/L (ref 3.5–5.1)
Sodium: 139 mEq/L (ref 135–145)

## 2015-03-09 LAB — HEMOGLOBIN A1C: Hgb A1c MFr Bld: 8.3 % — ABNORMAL HIGH (ref 4.6–6.5)

## 2015-03-12 ENCOUNTER — Encounter: Payer: Self-pay | Admitting: Family

## 2015-03-19 ENCOUNTER — Ambulatory Visit: Payer: PRIVATE HEALTH INSURANCE | Admitting: Family

## 2015-03-20 ENCOUNTER — Ambulatory Visit: Payer: 59 | Admitting: Family

## 2015-03-28 ENCOUNTER — Encounter: Payer: Self-pay | Admitting: Family

## 2015-03-28 ENCOUNTER — Ambulatory Visit (INDEPENDENT_AMBULATORY_CARE_PROVIDER_SITE_OTHER): Payer: 59 | Admitting: Family

## 2015-03-28 VITALS — BP 138/82 | HR 78 | Temp 98.0°F | Resp 16 | Ht 64.0 in | Wt 191.0 lb

## 2015-03-28 DIAGNOSIS — I10 Essential (primary) hypertension: Secondary | ICD-10-CM

## 2015-03-28 DIAGNOSIS — I96 Gangrene, not elsewhere classified: Secondary | ICD-10-CM | POA: Diagnosis not present

## 2015-03-28 DIAGNOSIS — E1151 Type 2 diabetes mellitus with diabetic peripheral angiopathy without gangrene: Secondary | ICD-10-CM | POA: Diagnosis not present

## 2015-03-28 MED ORDER — GABAPENTIN 300 MG PO CAPS: 300.0000 mg | ORAL_CAPSULE | Freq: Every day | ORAL | Status: DC

## 2015-03-28 MED ORDER — OXYCODONE-ACETAMINOPHEN 5-325 MG PO TABS: 1.0000 | ORAL_TABLET | Freq: Two times a day (BID) | ORAL | Status: DC | PRN

## 2015-03-28 MED ORDER — METFORMIN HCL ER 500 MG PO TB24: 500.0000 mg | ORAL_TABLET | Freq: Every day | ORAL | Status: DC

## 2015-03-28 NOTE — Assessment & Plan Note (Addendum)
Diabetes appears with improved control but still likely above goal based on home blood sugars. This is complicated by patient's ability to afford medication. Start Metformin. Most recent GFR of 48. Diabetic foot exam completed. Encouraged to complete diabetic eye exam when able to afford it. She is currently maintained on atorvastatin for CAD risk reduction. Consider adding ACE inhibitor at next office visit. Changes to medication regimen pending A1c results and follow-up in 3 months. Start gabapentin for peripheral neuropathies.
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
## 2015-03-28 NOTE — Progress Notes (Signed)
Pre visit review using our clinic review tool, if applicable. No additional management support is needed unless otherwise documented below in the visit note. 

## 2015-03-28 NOTE — Assessment & Plan Note (Signed)
Hypertension is adequately controlled with current regimen and below goal 140/90. Denies adverse side effects. Continue current dosage of hydralazine, carvedilol, and amlodipine. Encouraged compliance which is difficult secondary to finances appears she is doing the best she can with what her current situation allows her to do. Encouraged to monitor blood pressure at home. Follow-up in 3 months with diabetes follow-up.

## 2015-03-28 NOTE — Patient Instructions (Signed)
Thank you for choosing [facility].  Summary/Instructions:  Your prescription(s) have been submitted to your pharmacy or been printed and provided for you. Please take as directed and contact our office if you believe you are having problem(s) with the medication(s) or have any questions.  If your symptoms worsen or fail to improve, please contact our office for further instruction, or in case of emergency go directly to the emergency room at the closest medical facility.   Please continue to monitor your blood sugars at home.   Start the metformin after the holidays.   Please follow up with an opthalmologist for a diabetic eye exam.

## 2015-04-19 ENCOUNTER — Telehealth: Payer: Self-pay | Admitting: *Deleted

## 2015-04-19 DIAGNOSIS — I10 Essential (primary) hypertension: Secondary | ICD-10-CM

## 2015-04-19 MED ORDER — AMLODIPINE BESYLATE 10 MG PO TABS: 10.0000 mg | ORAL_TABLET | Freq: Every day | ORAL | Status: DC

## 2015-04-19 MED ORDER — VITAMIN D (ERGOCALCIFEROL) 1.25 MG (50000 UNIT) PO CAPS: 50000.0000 [IU] | ORAL_CAPSULE | ORAL | Status: DC

## 2015-04-19 NOTE — Telephone Encounter (Signed)
Received call pt states she need refill on her amlodipine & Vit D. Verified pharmacy inform [facility]...[name]/lmb

## 2015-05-09 ENCOUNTER — Telehealth: Payer: Self-pay | Admitting: *Deleted

## 2015-05-09 DIAGNOSIS — I96 Gangrene, not elsewhere classified: Secondary | ICD-10-CM

## 2015-05-09 MED ORDER — OXYCODONE-ACETAMINOPHEN 5-325 MG PO TABS: 1.0000 | ORAL_TABLET | Freq: Two times a day (BID) | ORAL | Status: DC | PRN

## 2015-05-09 NOTE — Telephone Encounter (Signed)
Medication refilled

## 2015-05-09 NOTE — Telephone Encounter (Signed)
Please determine if she is continuing to take the insuline 75/25. If she is I would like her to increase it 15 units twice daily, if she is not, please let me know and we will make other changes.

## 2015-05-09 NOTE — Telephone Encounter (Signed)
Left msg on triage needing refill on her pain med "Oxycodone"...[name]

## 2015-05-09 NOTE — Telephone Encounter (Signed)
Notified pt rx ready for pick-up. Pt also want to let Tammy Sours know that her BS are sunning in the high 200's, and she is not going to take the Gabapentin bcz med does not help...[name]

## 2015-05-10 NOTE — Telephone Encounter (Signed)
Pt return call back she states no she has not been taking the insulin. She is only taking 1 metformin a day. Wanting to know if she can take 2 a day...Shirley Chen

## 2015-05-10 NOTE — Telephone Encounter (Signed)
Left msg on triage RTC...[name]

## 2015-05-10 NOTE — Telephone Encounter (Signed)
Yes, please increase her metformin to 1000 mg daily.

## 2015-05-11 NOTE — Telephone Encounter (Signed)
Notified pt w/Greg response.../lmb 

## 2015-05-11 NOTE — Addendum Note (Signed)
Addended by: Deatra James on: 05/11/2015 10:22 AM   Modules accepted: Medications

## 2015-05-16 ENCOUNTER — Ambulatory Visit: Payer: PRIVATE HEALTH INSURANCE | Admitting: Vascular Surgery

## 2015-05-16 ENCOUNTER — Encounter (HOSPITAL_COMMUNITY): Payer: PRIVATE HEALTH INSURANCE

## 2015-05-16 ENCOUNTER — Other Ambulatory Visit (HOSPITAL_COMMUNITY): Payer: PRIVATE HEALTH INSURANCE

## 2015-06-11 ENCOUNTER — Telehealth: Payer: Self-pay | Admitting: *Deleted

## 2015-06-11 ENCOUNTER — Other Ambulatory Visit: Payer: Self-pay

## 2015-06-11 DIAGNOSIS — I96 Gangrene, not elsewhere classified: Secondary | ICD-10-CM

## 2015-06-11 DIAGNOSIS — I10 Essential (primary) hypertension: Secondary | ICD-10-CM

## 2015-06-11 MED ORDER — HYDRALAZINE HCL 25 MG PO TABS: 75.0000 mg | ORAL_TABLET | Freq: Three times a day (TID) | ORAL | Status: DC

## 2015-06-11 MED ORDER — METFORMIN HCL ER 500 MG PO TB24: 500.0000 mg | ORAL_TABLET | Freq: Two times a day (BID) | ORAL | Status: DC

## 2015-06-11 MED ORDER — OXYCODONE-ACETAMINOPHEN 5-325 MG PO TABS: 1.0000 | ORAL_TABLET | Freq: Two times a day (BID) | ORAL | Status: DC | PRN

## 2015-06-11 NOTE — Telephone Encounter (Signed)
Pt left msg on triage stating she have an appt set-up for 06/27/15, but needing to get refill on her Hydralazine, pain med, and since the metformin is not working for her will need to go back on the insulin "Humalog". Pls advise...[name]/lmb

## 2015-06-11 NOTE — Telephone Encounter (Signed)
Per Etta GrandchildMarkie gave pt rx and [employer] msg...[name]/lmb

## 2015-06-11 NOTE — Telephone Encounter (Signed)
Medication refilled. Will hold on Humalog until appointment.

## 2015-06-27 ENCOUNTER — Encounter: Payer: Self-pay | Admitting: Family

## 2015-06-27 ENCOUNTER — Other Ambulatory Visit (INDEPENDENT_AMBULATORY_CARE_PROVIDER_SITE_OTHER): Payer: 59

## 2015-06-27 ENCOUNTER — Ambulatory Visit (INDEPENDENT_AMBULATORY_CARE_PROVIDER_SITE_OTHER): Payer: 59 | Admitting: Family

## 2015-06-27 ENCOUNTER — Ambulatory Visit: Payer: 59 | Admitting: Vascular Surgery

## 2015-06-27 ENCOUNTER — Encounter (HOSPITAL_COMMUNITY): Payer: 59

## 2015-06-27 ENCOUNTER — Telehealth: Payer: Self-pay | Admitting: Family

## 2015-06-27 ENCOUNTER — Other Ambulatory Visit (HOSPITAL_COMMUNITY): Payer: Self-pay

## 2015-06-27 VITALS — BP 136/72 | HR 82 | Temp 97.6°F | Resp 16 | Ht 64.0 in | Wt 187.0 lb

## 2015-06-27 DIAGNOSIS — E1169 Type 2 diabetes mellitus with other specified complication: Secondary | ICD-10-CM | POA: Diagnosis not present

## 2015-06-27 DIAGNOSIS — I10 Essential (primary) hypertension: Secondary | ICD-10-CM | POA: Diagnosis not present

## 2015-06-27 DIAGNOSIS — E11628 Type 2 diabetes mellitus with other skin complications: Secondary | ICD-10-CM

## 2015-06-27 DIAGNOSIS — I96 Gangrene, not elsewhere classified: Secondary | ICD-10-CM | POA: Diagnosis not present

## 2015-06-27 DIAGNOSIS — L089 Local infection of the skin and subcutaneous tissue, unspecified: Secondary | ICD-10-CM | POA: Diagnosis not present

## 2015-06-27 LAB — HEMOGLOBIN A1C: Hgb A1c MFr Bld: 11.7 % — ABNORMAL HIGH (ref 4.6–6.5)

## 2015-06-27 MED ORDER — GLUCOSE BLOOD VI STRP: ORAL_STRIP | Status: DC

## 2015-06-27 MED ORDER — ATORVASTATIN CALCIUM 10 MG PO TABS: 10.0000 mg | ORAL_TABLET | Freq: Every day | ORAL | Status: DC

## 2015-06-27 MED ORDER — INSULIN LISPRO PROT & LISPRO (75-25 MIX) 100 UNIT/ML KWIKPEN: 10.0000 [IU] | PEN_INJECTOR | Freq: Two times a day (BID) | SUBCUTANEOUS | Status: DC

## 2015-06-27 MED ORDER — CARVEDILOL 25 MG PO TABS: 25.0000 mg | ORAL_TABLET | Freq: Two times a day (BID) | ORAL | Status: DC

## 2015-06-27 MED ORDER — RELION PEN NEEDLES 31G X 8 MM MISC: Status: DC

## 2015-06-27 MED ORDER — AMLODIPINE BESYLATE 10 MG PO TABS: 10.0000 mg | ORAL_TABLET | Freq: Every day | ORAL | Status: DC

## 2015-06-27 MED ORDER — HYDRALAZINE HCL 25 MG PO TABS: 75.0000 mg | ORAL_TABLET | Freq: Three times a day (TID) | ORAL | Status: DC

## 2015-06-27 MED ORDER — VITAMIN D (ERGOCALCIFEROL) 1.25 MG (50000 UNIT) PO CAPS: 50000.0000 [IU] | ORAL_CAPSULE | ORAL | Status: DC

## 2015-06-27 MED ORDER — METFORMIN HCL ER 500 MG PO TB24: 500.0000 mg | ORAL_TABLET | Freq: Two times a day (BID) | ORAL | Status: DC

## 2015-06-27 MED ORDER — OXYCODONE-ACETAMINOPHEN 5-325 MG PO TABS: 1.0000 | ORAL_TABLET | Freq: Two times a day (BID) | ORAL | Status: DC | PRN

## 2015-06-27 NOTE — Telephone Encounter (Signed)
Please inform patient that her A1c has increased back up to 11.7 with the metformin. Therefore please continue with the current plan of restarting the insulin and we will follow up in 3 months.

## 2015-06-27 NOTE — Progress Notes (Signed)
Pre visit review using our clinic review tool, if applicable. No additional management support is needed unless otherwise documented below in the visit note. 

## 2015-06-27 NOTE — Assessment & Plan Note (Signed)
Blood sugars remain uncontrolled with current dosage of metformin alone. Home readings in the 200s on average. Continue current dosage of metformin and restart Humalog 75/25 when able secondary to financial status. Given a sample of Tresiba until insulin is affordable. Continue current dosage of metformin. Obtain A1c. Encouraged to complete diabetic eye exam at her convenience. Diabetic foot exam is up-to-date. She is maintained on atorvastatin for CAD risk reduction. Declines Pneumovax. Continue to monitor blood sugar at home. Follow-up in 3 months.

## 2015-06-27 NOTE — Progress Notes (Signed)
Subjective:    Patient ID: Shirley Chen, female    DOB: 1956/04/01, 61 y.o.   MRN: [id number]  Chief Complaint  Patient presents with  . Follow-up    HPI:  Shirley Chen is a 60 y.o. female who  has a past medical history of Tobacco abuse; Cellulitis of right foot (10/2013); Headache; Diabetes mellitus without complication ([place]); Constipation; Arthritis; Hyperlipidemia; Hypertension; and UTI (lower urinary tract infection). and presents today for an office follow up.   1.) Type 2 diabetes - Currently managed on metformin. Reports taking the medication as prescribed and denies adverse side effects. Reports blood sugars at home in 220-250 range on average. Denies any new symptoms of end organ damage. Due for an eye exam and Pnumovax.   Lab Results  Component Value Date   HGBA1C 8.3* 03/09/2015    No Known Allergies   Current Outpatient Prescriptions on File Prior to Visit  Medication Sig Dispense Refill  . aspirin EC 81 MG tablet Take 1 tablet (81 mg total) by mouth daily. 30 tablet 0  . docusate sodium (COLACE) 100 MG capsule Take 1 capsule (100 mg total) by mouth 2 (two) times daily. 10 capsule 0  . gabapentin (NEURONTIN) 300 MG capsule Take 1 capsule (300 mg total) by mouth at bedtime. 30 capsule 0  . glucose monitoring kit (FREESTYLE) monitoring kit 1 each by Does not apply route 4 (four) times daily - after meals and at bedtime. 1 month Diabetic Testing Supplies for QAC-QHS accuchecks. Any brand OK. Diagnosis E11.65 1 each 1  . loratadine (CLARITIN) 10 MG tablet Take 1 tablet (10 mg total) by mouth daily.    . Melatonin 10 MG TBDP Take 20 mg by mouth at bedtime.    . Needle, Disp, 31G X 5/16" MISC Inject 1 application into the skin daily as needed (as directed by your insulin instructions. DX E11.09). 100 each 5  . nicotine (NICODERM CQ - DOSED IN MG/24 HOURS) 21 mg/24hr patch Place 1 patch (21 mg total) onto the skin daily. 21 patch 0   No current facility-administered  medications on file prior to visit.    Review of Systems  Eyes:       Denies changes in vision  Respiratory: Negative for chest tightness and shortness of breath.   Cardiovascular: Negative for chest pain, palpitations and leg swelling.  Endocrine: Negative for polydipsia, polyphagia and polyuria.  Neurological: Negative for weakness and numbness.      Objective:    BP 136/72 mmHg  Pulse 82  Temp(Src) 97.6 F (36.4 C) (Oral)  Resp 16  Ht _0  (1.626 m)  Wt 187 lb (84.823 kg)  BMI 32.08 kg/m2  SpO2 94% Nursing note and vital signs reviewed.  Physical Exam  Constitutional: She is oriented to person, place, and time. She appears well-developed and well-nourished. No distress.  Cardiovascular: Normal rate, regular rhythm, normal heart sounds and intact distal pulses.   Pulmonary/Chest: Effort normal and breath sounds normal.  Neurological: She is alert and oriented to person, place, and time.  Skin: Skin is warm and dry.  Psychiatric: She has a normal mood and affect. Her behavior is normal. Judgment and thought content normal.       Assessment & Plan:   Problem List Items Addressed This Visit      Cardiovascular and Mediastinum   HTN (hypertension)   Relevant Medications   amLODipine (NORVASC) 10 MG tablet   atorvastatin (LIPITOR) 10 MG tablet   carvedilol (COREG) 25  MG tablet   hydrALAZINE (APRESOLINE) 25 MG tablet     Endocrine   Type 2 diabetes mellitus with right diabetic foot infection ([place]) - Primary    Blood sugars remain uncontrolled with current dosage of metformin alone. Home readings in the 200s on average. Continue current dosage of metformin and restart Humalog 75/25 when able secondary to financial status. Given a sample of Tresiba until insulin is affordable. Continue current dosage of metformin. Obtain A1c. Encouraged to complete diabetic eye exam at her convenience. Diabetic foot exam is up-to-date. She is maintained on atorvastatin for CAD risk  reduction. Declines Pneumovax. Continue to monitor blood sugar at home. Follow-up in 3 months.      Relevant Medications   atorvastatin (LIPITOR) 10 MG tablet   glucose blood (FREESTYLE TEST STRIPS) test strip   metFORMIN (GLUCOPHAGE-XR) 500 MG 24 hr tablet   RELION PEN NEEDLE 31G/8MM 31G X 8 MM MISC   Insulin Lispro Prot & Lispro (HUMALOG 75/25 MIX) (75-25) 100 UNIT/ML Kwikpen   Other Relevant Orders   Hemoglobin A1c     Other   Gangrene of toe: Right great toe with osteomyelitis   Relevant Medications   oxyCODONE-acetaminophen (PERCOCET/ROXICET) 5-325 MG tablet

## 2015-06-27 NOTE — Patient Instructions (Signed)
Thank you for choosing [facility].  Summary/Instructions:  Please continue to take her medications as prescribed.  Start Tresiba - 18 units daily until change to Humalog.  Your prescription(s) have been submitted to your pharmacy or been printed and provided for you. Please take as directed and contact our office if you believe you are having problem(s) with the medication(s) or have any questions.  Please stop by the lab on the basement level of the building for your blood work. Your results will be released to MyChart (or called to you) after review, usually within 72 hours after test completion. If any changes need to be made, you will be notified at that same time.  If your symptoms worsen or fail to improve, please contact our office for further instruction, or in case of emergency go directly to the emergency room at the closest medical facility.

## 2015-07-02 NOTE — Telephone Encounter (Signed)
Pt aware of results 

## 2015-08-03 ENCOUNTER — Encounter: Payer: Self-pay | Admitting: Vascular Surgery

## 2015-08-08 ENCOUNTER — Ambulatory Visit (INDEPENDENT_AMBULATORY_CARE_PROVIDER_SITE_OTHER)
Admission: RE | Admit: 2015-08-08 | Discharge: 2015-08-08 | Disposition: A | Discharge status: Home / Self Care | Payer: 59 | Source: Ambulatory Visit | Attending: Vascular Surgery | Admitting: Vascular Surgery

## 2015-08-08 ENCOUNTER — Telehealth: Payer: Self-pay | Admitting: *Deleted

## 2015-08-08 ENCOUNTER — Ambulatory Visit (HOSPITAL_COMMUNITY)
Admission: RE | Admit: 2015-08-08 | Discharge: 2015-08-08 | Disposition: A | Discharge status: Home / Self Care | Payer: 59 | Source: Ambulatory Visit | Attending: Vascular Surgery | Admitting: Vascular Surgery

## 2015-08-08 ENCOUNTER — Encounter: Payer: Self-pay | Admitting: Vascular Surgery

## 2015-08-08 ENCOUNTER — Ambulatory Visit (INDEPENDENT_AMBULATORY_CARE_PROVIDER_SITE_OTHER): Payer: 59 | Admitting: Vascular Surgery

## 2015-08-08 VITALS — BP 132/80 | HR 76 | Temp 97.2°F | Resp 16 | Ht 64.0 in | Wt 194.0 lb

## 2015-08-08 DIAGNOSIS — I96 Gangrene, not elsewhere classified: Secondary | ICD-10-CM

## 2015-08-08 DIAGNOSIS — I7025 Atherosclerosis of native arteries of other extremities with ulceration: Secondary | ICD-10-CM | POA: Diagnosis not present

## 2015-08-08 DIAGNOSIS — Z720 Tobacco use: Secondary | ICD-10-CM | POA: Insufficient documentation

## 2015-08-08 DIAGNOSIS — R0989 Other specified symptoms and signs involving the circulatory and respiratory systems: Secondary | ICD-10-CM | POA: Diagnosis present

## 2015-08-08 DIAGNOSIS — E785 Hyperlipidemia, unspecified: Secondary | ICD-10-CM | POA: Diagnosis not present

## 2015-08-08 DIAGNOSIS — E119 Type 2 diabetes mellitus without complications: Secondary | ICD-10-CM | POA: Diagnosis not present

## 2015-08-08 DIAGNOSIS — I10 Essential (primary) hypertension: Secondary | ICD-10-CM | POA: Insufficient documentation

## 2015-08-08 MED ORDER — OXYCODONE-ACETAMINOPHEN 5-325 MG PO TABS: 1.0000 | ORAL_TABLET | Freq: Two times a day (BID) | ORAL | Status: DC | PRN

## 2015-08-08 NOTE — Progress Notes (Signed)
[facility]  Patient name: Shirley Chen MRN: [id number] DOB: 05-Jul-1955 Sex: female  REASON FOR VISIT: follow up after right lower extremity bypass.  HPI: Shirley Chen is a 60 y.o. female who underwent a right femoral to anterior tibial artery bypass with a vein graft on 10/27/2014. The patient also had amputation of the right great toe on 11/28/2014. When I saw the patient last on 02/27/2015, the bypass graft was working well and the incisions were healing nicely. The toe amputation site had healed. The wound on the medial heel was slowly improving. She comes in for a 3 month follow up visit.   Since I saw her last, she's had no significant pain in the right leg. The wounds of the right foot have healed. She has had some moderate leg swelling. Unfortunately, she had quit smoking but has started smoking again.  Past Medical History  Diagnosis Date  . Tobacco abuse   . Cellulitis of right foot 10/2013  . Headache     "maybe weekly" (10/20/2014)  . Diabetes mellitus without complication ([place])     Type II  . Constipation   . Arthritis     "joints in hands and right leg ache" (10/20/2014)  . Hyperlipidemia   . Hypertension   . UTI (lower urinary tract infection)     Family History  Problem Relation Age of Onset  . Diabetes Mother   . Lung cancer Brother   . Alcohol abuse Father   . Hyperlipidemia Father   . Heart disease Father   . Hypertension Father   . Heart attack Father     Late 21s    SOCIAL HISTORY: Social History  Substance Use Topics  . Smoking status: Former Smoker -- 1.00 packs/day for 49 years    Types: Cigarettes    Quit date: 11/08/2014  . Smokeless tobacco: Never Used     Comment: 10/27/14- smoke one today, is stopping  . Alcohol Use: 0.0 oz/week    0 Standard drinks or equivalent per week     Comment: occasional    No Known Allergies  Current Outpatient Prescriptions  Medication Sig Dispense Refill  . amLODipine  (NORVASC) 10 MG tablet Take 1 tablet (10 mg total) by mouth daily. 90 tablet 1  . aspirin EC 81 MG tablet Take 1 tablet (81 mg total) by mouth daily. 30 tablet 0  . atorvastatin (LIPITOR) 10 MG tablet Take 1 tablet (10 mg total) by mouth daily. 90 tablet 1  . carvedilol (COREG) 25 MG tablet Take 1 tablet (25 mg total) by mouth 2 (two) times daily with a meal. 180 tablet 1  . glucose blood (FREESTYLE TEST STRIPS) test strip Use 1 strip per test to test blood sugars 1-3 times daily. 100 each 3  . glucose monitoring kit (FREESTYLE) monitoring kit 1 each by Does not apply route 4 (four) times daily - after meals and at bedtime. 1 month Diabetic Testing Supplies for QAC-QHS accuchecks. Any brand OK. Diagnosis E11.65 1 each 1  . hydrALAZINE (APRESOLINE) 25 MG tablet Take 3 tablets (75 mg total) by mouth 3 (three) times daily. 270 tablet 0  . Insulin Lispro Prot & Lispro (HUMALOG 75/25 MIX) (75-25) 100 UNIT/ML Kwikpen Inject 10 Units into the skin 2 (two) times daily. 15 mL 11  . loratadine (CLARITIN) 10 MG tablet Take 1 tablet (10 mg total) by mouth daily.    . Melatonin 10 MG TBDP Take 20 mg by mouth at bedtime.    Marland Kitchen  metFORMIN (GLUCOPHAGE-XR) 500 MG 24 hr tablet Take 1 tablet (500 mg total) by mouth 2 (two) times daily. 60 tablet 0  . Needle, Disp, 31G X 5/16" MISC Inject 1 application into the skin daily as needed (as directed by your insulin instructions. DX E11.09). 100 each 5  . oxyCODONE-acetaminophen (PERCOCET/ROXICET) 5-325 MG tablet Take 1 tablet by mouth 2 (two) times daily as needed for severe pain. 60 tablet 0  . RELION PEN NEEDLE 31G/8MM 31G X 8 MM MISC Use to administer insulin four times a day 100 each 3  . Vitamin D, Ergocalciferol, (DRISDOL) 50000 units CAPS capsule Take 1 capsule (50,000 Units total) by mouth every 7 (seven) days. 12 capsule 0  . docusate sodium (COLACE) 100 MG capsule Take 1 capsule (100 mg total) by mouth 2 (two) times daily. (Patient not taking: Reported on 08/08/2015) 10  capsule 0  . gabapentin (NEURONTIN) 300 MG capsule Take 1 capsule (300 mg total) by mouth at bedtime. (Patient not taking: Reported on 08/08/2015) 30 capsule 0   No current facility-administered medications for this visit.    REVIEW OF SYSTEMS:  [X] denotes positive finding, [ ] denotes negative finding Cardiac  Comments:  Chest pain or chest pressure:    Shortness of breath upon exertion:    Short of breath when lying flat:    Irregular heart rhythm:        Vascular    Pain in calf, thigh, or hip brought on by ambulation:    Pain in feet at night that wakes you up from your sleep:     Blood clot in your veins:    Leg swelling:         Pulmonary    Oxygen at home:    Productive cough:     Wheezing:         Neurologic    Sudden weakness in arms or legs:     Sudden numbness in arms or legs:     Sudden onset of difficulty speaking or slurred speech:    Temporary loss of vision in one eye:     Problems with dizziness:         Gastrointestinal    Blood in stool:     Vomited blood:         Genitourinary    Burning when urinating:     Blood in urine:        Psychiatric    Major depression:         Hematologic    Bleeding problems:    Problems with blood clotting too easily:        Skin    Rashes or ulcers:        Constitutional    Fever or chills:      PHYSICAL EXAM: Filed Vitals:   08/08/15 0926  BP: 132/80  Pulse: 76  Temp: 97.2 F (36.2 C)  TempSrc: Oral  Resp: 16  Height: 5' 4" (1.626 m)  Weight: 194 lb (87.998 kg)  SpO2: 98%    GENERAL: The patient is a well-nourished female, in no acute distress. The vital signs are documented above. CARDIAC: There is a regular rate and rhythm.  VASCULAR: I do not detect carotid bruits. She has an easily palpable graft pulse in her right leg. He has moderate right lower extremity swelling. PULMONARY: There is good air exchange bilaterally without wheezing or rales. ABDOMEN: Soft and non-tender with normal pitched  bowel sounds.  MUSCULOSKELETAL: The right great toe  amputation site has healed. NEUROLOGIC: No focal weakness or paresthesias are detected. SKIN: There are no ulcers or rashes noted. PSYCHIATRIC: The patient has a normal affect.  DATA:   LOWER EXTREMITY ARTERIAL DOPPLER STUDY: I have independently interpreted her lower extremity arterial Doppler study. On the right side she has a triphasic dorsalis pedis signal with an ABI of 100%. On the left side she has a triphasic posterior tibial signal and a biphasic dorsalis pedis signal. ABI on the left is 81%. Toe pressure on the left is 102 mmHg.  LOWER EXTREMITY ARTERIAL DUPLEX: I have independently interpreted her graft duplex. She has triphasic signals throughout the graft with no areas of significantly increased velocities.  MEDICAL ISSUES:  Status Post Right Femoral Anterior Tibial Artery Bypass and Right Great Toe Amputation: the patient is doing well status post right lower extremity bypass. The toe amputation site has healed. We have discussed the importance of tobacco cessation. She understands this will increase her chance of graft patency. She has moderate leg swelling and we have also discussed the importance in of intermittent leg elevation and the proper positioning for this. I'll see her back in 3 months with a graft duplex and ABIs. She knows to call sooner if she has problems.  Deitra Mayo [facility] [place]: [phone]

## 2015-08-08 NOTE — Telephone Encounter (Signed)
Left msg on triage requesting refill on pain med Hydrocodone.../lmb 

## 2015-08-08 NOTE — Telephone Encounter (Signed)
Medication printed for pick up.  

## 2015-08-10 MED ORDER — OXYCODONE-ACETAMINOPHEN 5-325 MG PO TABS: 1.0000 | ORAL_TABLET | Freq: Two times a day (BID) | ORAL | Status: DC | PRN

## 2015-08-10 NOTE — Addendum Note (Signed)
Addended by: Deatra JamesBRAND, Mithran Strike M on: 08/10/2015 03:49 PM   Modules accepted: Orders

## 2015-08-10 NOTE — Telephone Encounter (Signed)
Pt came to pick-up script for pain med. We coould not locate prescription. Both Tammy SoursGreg & Ladona Ridgelaylor is out of the office. Per Dr. Jonny RuizJohn ok tore-print & he will sign. Rx printed [facility]...[name]/lmb

## 2015-09-06 ENCOUNTER — Telehealth: Payer: Self-pay | Admitting: *Deleted

## 2015-09-06 DIAGNOSIS — E11628 Type 2 diabetes mellitus with other skin complications: Secondary | ICD-10-CM

## 2015-09-06 DIAGNOSIS — L089 Local infection of the skin and subcutaneous tissue, unspecified: Principal | ICD-10-CM

## 2015-09-06 DIAGNOSIS — I96 Gangrene, not elsewhere classified: Secondary | ICD-10-CM

## 2015-09-06 MED ORDER — OXYCODONE-ACETAMINOPHEN 5-325 MG PO TABS: 1.0000 | ORAL_TABLET | Freq: Two times a day (BID) | ORAL | Status: DC | PRN

## 2015-09-06 MED ORDER — INSULIN LISPRO PROT & LISPRO (75-25 MIX) 100 UNIT/ML KWIKPEN: 10.0000 [IU] | PEN_INJECTOR | Freq: Two times a day (BID) | SUBCUTANEOUS | Status: DC

## 2015-09-06 MED ORDER — GLUCOSE BLOOD VI STRP: 1.0000 | ORAL_STRIP | Freq: Three times a day (TID) | Status: DC

## 2015-09-06 MED ORDER — RELION PEN NEEDLES 31G X 8 MM MISC: Status: DC

## 2015-09-06 MED ORDER — METFORMIN HCL ER 500 MG PO TB24: 500.0000 mg | ORAL_TABLET | Freq: Two times a day (BID) | ORAL | Status: DC

## 2015-09-06 MED ORDER — HYDRALAZINE HCL 25 MG PO TABS: 75.0000 mg | ORAL_TABLET | Freq: Three times a day (TID) | ORAL | Status: DC

## 2015-09-06 NOTE — Telephone Encounter (Signed)
Left msg on triage stating she is needing refills on hydralazine, humalog, metformin, and relion syringes. She has made appt for 6/21, but will be out of meds. Sent 30 day until appt,,,/lmb

## 2015-09-06 NOTE — Telephone Encounter (Signed)
Notified pt rx ready for pick-up.../lmb 

## 2015-09-06 NOTE — Telephone Encounter (Signed)
Left msg on triage stating she has appt schedule for 6/21, but she will be out of her pain med on 6/6. Requesting refill on her pain med...[name]/lmb

## 2015-09-12 NOTE — Addendum Note (Signed)
Addended by: Melodye PedMANESS-HARRISON, Alesa Echevarria C on: 09/12/2015 11:52 AM   Modules accepted: Orders

## 2015-09-26 ENCOUNTER — Ambulatory Visit: Payer: 59 | Admitting: Family

## 2015-10-02 ENCOUNTER — Ambulatory Visit: Payer: 59 | Admitting: Family

## 2015-10-08 ENCOUNTER — Ambulatory Visit (INDEPENDENT_AMBULATORY_CARE_PROVIDER_SITE_OTHER): Payer: 59 | Admitting: Family

## 2015-10-08 ENCOUNTER — Other Ambulatory Visit: Payer: 59

## 2015-10-08 ENCOUNTER — Encounter: Payer: Self-pay | Admitting: Family

## 2015-10-08 ENCOUNTER — Other Ambulatory Visit: Payer: Self-pay | Admitting: Family

## 2015-10-08 VITALS — BP 154/82 | HR 81 | Temp 97.6°F | Resp 16 | Ht 64.0 in | Wt 197.0 lb

## 2015-10-08 DIAGNOSIS — I10 Essential (primary) hypertension: Secondary | ICD-10-CM

## 2015-10-08 DIAGNOSIS — I96 Gangrene, not elsewhere classified: Secondary | ICD-10-CM | POA: Diagnosis not present

## 2015-10-08 DIAGNOSIS — E1159 Type 2 diabetes mellitus with other circulatory complications: Secondary | ICD-10-CM | POA: Diagnosis not present

## 2015-10-08 DIAGNOSIS — Z794 Long term (current) use of insulin: Secondary | ICD-10-CM

## 2015-10-08 DIAGNOSIS — Z1231 Encounter for screening mammogram for malignant neoplasm of breast: Secondary | ICD-10-CM

## 2015-10-08 LAB — MICROALBUMIN / CREATININE URINE RATIO
Creatinine,U: 105.1 mg/dL
Microalb Creat Ratio: 2.6 mg/g (ref 0.0–30.0)
Microalb, Ur: 2.7 mg/dL — ABNORMAL HIGH (ref 0.0–1.9)

## 2015-10-08 LAB — POCT GLYCOSYLATED HEMOGLOBIN (HGB A1C): Hemoglobin A1C: 8.9

## 2015-10-08 MED ORDER — CARVEDILOL 25 MG PO TABS: 25.0000 mg | ORAL_TABLET | Freq: Two times a day (BID) | ORAL | Status: DC

## 2015-10-08 MED ORDER — HYDRALAZINE HCL 50 MG PO TABS: 75.0000 mg | ORAL_TABLET | Freq: Three times a day (TID) | ORAL | Status: DC

## 2015-10-08 MED ORDER — INSULIN LISPRO PROT & LISPRO (75-25 MIX) 100 UNIT/ML KWIKPEN: PEN_INJECTOR | SUBCUTANEOUS | Status: DC

## 2015-10-08 MED ORDER — OXYCODONE-ACETAMINOPHEN 5-325 MG PO TABS: 1.0000 | ORAL_TABLET | Freq: Two times a day (BID) | ORAL | Status: DC | PRN

## 2015-10-08 MED ORDER — METFORMIN HCL ER 500 MG PO TB24: 500.0000 mg | ORAL_TABLET | Freq: Two times a day (BID) | ORAL | Status: DC

## 2015-10-08 MED ORDER — GLUCOSE BLOOD VI STRP: 1.0000 | ORAL_STRIP | Freq: Three times a day (TID) | Status: DC

## 2015-10-08 MED ORDER — AMLODIPINE BESYLATE 10 MG PO TABS: 10.0000 mg | ORAL_TABLET | Freq: Every day | ORAL | Status: DC

## 2015-10-08 MED ORDER — VITAMIN D (ERGOCALCIFEROL) 1.25 MG (50000 UNIT) PO CAPS: 50000.0000 [IU] | ORAL_CAPSULE | ORAL | Status: DC

## 2015-10-08 MED ORDER — ATORVASTATIN CALCIUM 10 MG PO TABS: 10.0000 mg | ORAL_TABLET | Freq: Every day | ORAL | Status: DC

## 2015-10-08 NOTE — Assessment & Plan Note (Signed)
Type 2 diabetes remains uncontrolled with an office A1c of 8.9. Increase insulin to 20 units in the a.m. and 15 units in the PM. If blood sugars remain elevated at additional 5 units in the PM. Denies symptoms of additional end organ damage. Foot exam completed today. Maintained on atorvastatin for CAD risk reduction. Encouraged to complete diabetic eye exam independently with difficulty secondary to insurance/money. Continue to monitor blood sugar at home. Continue current dosage of metformin. Follow-up in 3 months or sooner if needed.

## 2015-10-08 NOTE — Assessment & Plan Note (Signed)
Previous osteomyelitis and amputation of the right great toe with continued pain management gabapentin and oxycodone-acetaminophen. She is able to function  with current dosage. Consider additional pain medication if symptoms continue to worsen or do not improve. [place] controlled substance database reviewed with no irregularities.
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
## 2015-10-08 NOTE — Patient Instructions (Addendum)
Thank you for choosing [facility].  Summary/Instructions:  Please continue to take your medications as prescribed.  Please increase your Insulin to 20 units in the morning and 15 units in the evening. If your blood sugars remain elevated increase to 20 units in both the morning and evening.   Your prescription(s) have been submitted to your pharmacy or been printed and provided for you. Please take as directed and contact our office if you believe you are having problem(s) with the medication(s) or have any questions.    If your symptoms worsen or fail to improve, please contact our office for further instruction, or in case of emergency go directly to the emergency room at the closest medical facility.

## 2015-10-08 NOTE — Progress Notes (Signed)
Pre visit review using our clinic review tool, if applicable. No additional management support is needed unless otherwise documented below in the visit note. 

## 2015-10-08 NOTE — Assessment & Plan Note (Signed)
Hypertension slightly elevated today above goal 140/90 with current regimen. No adverse side effects or hypotensive readings. Denies worse headache of life. Continue current dosage of amlodipine, carvedilol and hydralazine.

## 2015-10-09 ENCOUNTER — Encounter: Payer: Self-pay | Admitting: Family

## 2015-10-11 ENCOUNTER — Other Ambulatory Visit: Payer: Self-pay | Admitting: *Deleted

## 2015-10-11 DIAGNOSIS — I10 Essential (primary) hypertension: Secondary | ICD-10-CM

## 2015-10-11 MED ORDER — HYDRALAZINE HCL 50 MG PO TABS: 75.0000 mg | ORAL_TABLET | Freq: Three times a day (TID) | ORAL | Status: DC

## 2015-10-11 NOTE — Telephone Encounter (Signed)
Received fax stating needing updated script fpr Hydralazine. Rx that was sent quantity will not last a month. Needing #270 for month supply. Updated script sent to [facility]...[name]/lmb

## 2015-11-06 ENCOUNTER — Telehealth: Payer: Self-pay | Admitting: Emergency Medicine

## 2015-11-06 DIAGNOSIS — E1159 Type 2 diabetes mellitus with other circulatory complications: Secondary | ICD-10-CM

## 2015-11-06 DIAGNOSIS — I96 Gangrene, not elsewhere classified: Secondary | ICD-10-CM

## 2015-11-06 NOTE — Telephone Encounter (Signed)
Pt called and needs a prescription refill on metFORMIN (GLUCOPHAGE-XR) 500 MG 24 hr tablet, Insulin Lispro Prot & Lispro (HUMALOG 75/25 MIX) (75-25) 100 UNIT/ML Kwikpen, oxyCODONE-acetaminophen (PERCOCET/ROXICET) 5-325 MG tablet. Pt will have someone come pick those up. Thanks.

## 2015-11-06 NOTE — Telephone Encounter (Signed)
Please advise 

## 2015-11-07 MED ORDER — OXYCODONE-ACETAMINOPHEN 5-325 MG PO TABS: 1.0000 | ORAL_TABLET | Freq: Two times a day (BID) | ORAL | 0 refills | Status: DC | PRN

## 2015-11-07 MED ORDER — INSULIN LISPRO PROT & LISPRO (75-25 MIX) 100 UNIT/ML KWIKPEN: PEN_INJECTOR | SUBCUTANEOUS | 0 refills | Status: DC

## 2015-11-07 MED ORDER — METFORMIN HCL ER 500 MG PO TB24: 500.0000 mg | ORAL_TABLET | Freq: Two times a day (BID) | ORAL | 0 refills | Status: DC

## 2015-11-07 NOTE — Telephone Encounter (Signed)
Pt aware.

## 2015-11-07 NOTE — Telephone Encounter (Signed)
Medications refilled and available for pick up. 

## 2015-12-05 ENCOUNTER — Other Ambulatory Visit: Payer: Self-pay | Admitting: *Deleted

## 2015-12-05 DIAGNOSIS — I96 Gangrene, not elsewhere classified: Secondary | ICD-10-CM

## 2015-12-05 DIAGNOSIS — I10 Essential (primary) hypertension: Secondary | ICD-10-CM

## 2015-12-05 DIAGNOSIS — E1159 Type 2 diabetes mellitus with other circulatory complications: Secondary | ICD-10-CM

## 2015-12-05 MED ORDER — HYDRALAZINE HCL 50 MG PO TABS: 75.0000 mg | ORAL_TABLET | Freq: Three times a day (TID) | ORAL | 2 refills | Status: DC

## 2015-12-05 MED ORDER — METFORMIN HCL ER 500 MG PO TB24: 500.0000 mg | ORAL_TABLET | Freq: Two times a day (BID) | ORAL | 1 refills | Status: DC

## 2015-12-05 MED ORDER — GLUCOSE BLOOD VI STRP: 1.0000 | ORAL_STRIP | Freq: Three times a day (TID) | 5 refills | Status: DC

## 2015-12-05 MED ORDER — OXYCODONE-ACETAMINOPHEN 5-325 MG PO TABS: 1.0000 | ORAL_TABLET | Freq: Two times a day (BID) | ORAL | 0 refills | Status: DC | PRN

## 2015-12-05 NOTE — Telephone Encounter (Signed)
Oxycodone refilled.

## 2015-12-05 NOTE — Telephone Encounter (Signed)
Pt left msg on triage requesting refills on her pain med, hydralazine, and freestyle test trips. Set maintenance meds pls advise on Oxycodone...[name]/lmb

## 2015-12-07 ENCOUNTER — Other Ambulatory Visit: Payer: Self-pay | Admitting: Family

## 2015-12-07 DIAGNOSIS — E1159 Type 2 diabetes mellitus with other circulatory complications: Secondary | ICD-10-CM

## 2016-01-02 ENCOUNTER — Telehealth: Payer: Self-pay | Admitting: *Deleted

## 2016-01-02 DIAGNOSIS — E11628 Type 2 diabetes mellitus with other skin complications: Secondary | ICD-10-CM

## 2016-01-02 DIAGNOSIS — I10 Essential (primary) hypertension: Secondary | ICD-10-CM

## 2016-01-02 DIAGNOSIS — L089 Local infection of the skin and subcutaneous tissue, unspecified: Secondary | ICD-10-CM

## 2016-01-02 DIAGNOSIS — E1159 Type 2 diabetes mellitus with other circulatory complications: Secondary | ICD-10-CM

## 2016-01-02 DIAGNOSIS — I96 Gangrene, not elsewhere classified: Secondary | ICD-10-CM

## 2016-01-02 MED ORDER — CARVEDILOL 25 MG PO TABS: 25.0000 mg | ORAL_TABLET | Freq: Two times a day (BID) | ORAL | 1 refills | Status: DC

## 2016-01-02 MED ORDER — OXYCODONE-ACETAMINOPHEN 5-325 MG PO TABS: 1.0000 | ORAL_TABLET | Freq: Two times a day (BID) | ORAL | 0 refills | Status: DC | PRN

## 2016-01-02 MED ORDER — RELION PEN NEEDLES 31G X 8 MM MISC: 3 refills | Status: DC

## 2016-01-02 MED ORDER — ATORVASTATIN CALCIUM 10 MG PO TABS: 10.0000 mg | ORAL_TABLET | Freq: Every day | ORAL | 1 refills | Status: DC

## 2016-01-02 MED ORDER — INSULIN LISPRO PROT & LISPRO (75-25 MIX) 100 UNIT/ML KWIKPEN: PEN_INJECTOR | SUBCUTANEOUS | 5 refills | Status: DC

## 2016-01-02 MED ORDER — VITAMIN D (ERGOCALCIFEROL) 1.25 MG (50000 UNIT) PO CAPS: 50000.0000 [IU] | ORAL_CAPSULE | ORAL | 0 refills | Status: DC

## 2016-01-02 MED ORDER — AMLODIPINE BESYLATE 10 MG PO TABS: 10.0000 mg | ORAL_TABLET | Freq: Every day | ORAL | 1 refills | Status: DC

## 2016-01-02 NOTE — Telephone Encounter (Signed)
Notified pt rx ready for pick-up.../lmb 

## 2016-01-02 NOTE — Telephone Encounter (Signed)
Medication refilled for pick up. 

## 2016-01-02 NOTE — Telephone Encounter (Signed)
Rec'd call pt requesting refills on Humalog 75/25, Atorvastatin, Coreg, Amlodipine, Relion pen needles, and pain med Oxycodone. I have sent maintenance meds pls advise on pain med...[name]/lmb

## 2016-01-16 ENCOUNTER — Ambulatory Visit: Payer: 59 | Admitting: Family

## 2016-01-17 ENCOUNTER — Other Ambulatory Visit (INDEPENDENT_AMBULATORY_CARE_PROVIDER_SITE_OTHER): Payer: 59

## 2016-01-17 ENCOUNTER — Ambulatory Visit (INDEPENDENT_AMBULATORY_CARE_PROVIDER_SITE_OTHER): Payer: 59 | Admitting: Family

## 2016-01-17 ENCOUNTER — Encounter: Payer: Self-pay | Admitting: Family

## 2016-01-17 VITALS — BP 148/80 | HR 82 | Temp 97.8°F | Resp 16 | Ht 64.0 in | Wt 199.1 lb

## 2016-01-17 DIAGNOSIS — I10 Essential (primary) hypertension: Secondary | ICD-10-CM | POA: Diagnosis not present

## 2016-01-17 DIAGNOSIS — E1151 Type 2 diabetes mellitus with diabetic peripheral angiopathy without gangrene: Secondary | ICD-10-CM

## 2016-01-17 LAB — RENAL FUNCTION PANEL
Albumin: 3.9 g/dL (ref 3.5–5.2)
BUN: 14 mg/dL (ref 6–23)
CO2: 32 mEq/L (ref 19–32)
Calcium: 9.8 mg/dL (ref 8.4–10.5)
Chloride: 104 mEq/L (ref 96–112)
Creatinine, Ser: 0.85 mg/dL (ref 0.40–1.20)
GFR: 72.68 mL/min (ref >60.00)
Glucose, Bld: 181 mg/dL — ABNORMAL HIGH (ref 70–99)
Phosphorus: 3.8 mg/dL (ref 2.3–4.6)
Potassium: 4.5 mEq/L (ref 3.5–5.1)
Sodium: 141 mEq/L (ref 135–145)

## 2016-01-17 LAB — HEMOGLOBIN A1C: Hgb A1c MFr Bld: 8.4 % — ABNORMAL HIGH (ref 4.6–6.5)

## 2016-01-17 MED ORDER — LOSARTAN POTASSIUM 50 MG PO TABS: 50.0000 mg | ORAL_TABLET | Freq: Every day | ORAL | 3 refills | Status: DC

## 2016-01-17 NOTE — Assessment & Plan Note (Deleted)
Type 2 diabetes remains labile based on home blood sugars and previous A1c of 8.9. Obtain renal panel and A1c. Start losartan for CAD risk reduction. Maintained on atorvastatin for CAD risk reduction. Working to complete her diabetic eye exam and she had to cancel an appointment previously. Continue current dosage of metformin. Increase NovoLog Mix 75/25 to 15 units twice daily. Continue to monitor blood sugars at home. Pneumovax is up-to-date. Continue to monitor.  

## 2016-01-17 NOTE — Patient Instructions (Addendum)
Thank you for choosing [facility].  SUMMARY AND INSTRUCTIONS:  Medication:  Please start taking the losartan daily.  Increase your Insulin to 15 units twice daily.   Your prescription(s) have been submitted to your pharmacy or been printed and provided for you. Please take as directed and contact our office if you believe you are having problem(s) with the medication(s) or have any questions.  Labs:  Please stop by the lab on the lower level of the building for your blood work. Your results will be released to MyChart (or called to you) after review, usually within 72 hours after test completion. If any changes need to be made, you will be notified at that same time.  1.) The lab is open from 7:30am to 5:30 pm Monday-Friday 2.) No appointment is necessary 3.) Fasting (if needed) is 6-8 hours after food and drink; black coffee and water are okay   Follow up:  If your symptoms worsen or fail to improve, please contact our office for further instruction, or in case of emergency go directly to the emergency room at the closest medical facility.   DIABETES CARE INSTRUCTIONS:  Current A1c:  Lab Results  Component Value Date   HGBA1C 8.9 10/08/2015    A1C Goal: <7.0%  Fasting Blood Sugar Goal: 80-130 Blood sugar check that you take after fasting for at least 8 hours which is usually in the morning before eating.  Post-prandial Blood Sugar Goal: <180 Blood sugar check approximately 1-2 hours after the start of a meal.    Diabetes Prevention Screens:  1.) Ensure you have an annual eye exam by an ophthalmologist or optometrist.   2,) Ensure you have an annual foot exam or when any changes are noted including new onset numbness/tingling or wounds. Check your feet daily!  3.) The American Diabetes Association recommends the Pneumovax vaccination against pneumonia every 5 years.  4.) We will check your kidney function with a urine test on an annual basis.

## 2016-01-17 NOTE — Progress Notes (Signed)
Subjective:    Patient ID: Shirley Chen, female    DOB: 29-Sep-1955, 60 y.o.   MRN: [id number]  Chief Complaint  Patient presents with  . Follow-up    A1c check    HPI:  Shirley Chen is a 60 y.o. female who  has a past medical history of Arthritis; Cellulitis of right foot (10/2013); Constipation; Diabetes mellitus without complication ([place]); Headache; Hyperlipidemia; Hypertension; Tobacco abuse; and UTI (lower urinary tract infection). and presents today for a follow up office visit.  1.) Type 2 diabetes - currently maintained on Humalog 75/25 and metformin. Reports taken medications prescribed and denies adverse side effects. Blood sugars at home have been variable. Currently taking 10 units if <150 and 15 units if >150 twice daily. No new symptoms of end organ damage present.  Lab Results  Component Value Date   HGBA1C 8.4 (H) 01/17/2016    No Known Allergies    Outpatient Medications Prior to Visit  Medication Sig Dispense Refill  . amLODipine (NORVASC) 10 MG tablet Take 1 tablet (10 mg total) by mouth daily. 90 tablet 1  . aspirin EC 81 MG tablet Take 1 tablet (81 mg total) by mouth daily. 30 tablet 0  . atorvastatin (LIPITOR) 10 MG tablet Take 1 tablet (10 mg total) by mouth daily. 90 tablet 1  . carvedilol (COREG) 25 MG tablet Take 1 tablet (25 mg total) by mouth 2 (two) times daily with a meal. 180 tablet 1  . FREESTYLE LITE test strip USE ONE STRIP TO TEST BLOOD SUGAR THREE TIMES A DAY 100 each 0  . glucose monitoring kit (FREESTYLE) monitoring kit 1 each by Does not apply route 4 (four) times daily - after meals and at bedtime. 1 month Diabetic Testing Supplies for QAC-QHS accuchecks. Any brand OK. Diagnosis E11.65 1 each 1  . hydrALAZINE (APRESOLINE) 50 MG tablet Take 1.5 tablets (75 mg total) by mouth 3 (three) times daily. 270 tablet 2  . Insulin Lispro Prot & Lispro (HUMALOG 75/25 MIX) (75-25) 100 UNIT/ML Kwikpen Please inject 20 units subcutaneously in the am and  15 units in the evening. 15 mL 5  . loratadine (CLARITIN) 10 MG tablet Take 1 tablet (10 mg total) by mouth daily.    . Melatonin 10 MG TBDP Take 20 mg by mouth at bedtime.    . metFORMIN (GLUCOPHAGE-XR) 500 MG 24 hr tablet Take 1 tablet (500 mg total) by mouth 2 (two) times daily. 180 tablet 1  . Needle, Disp, 31G X 5/16" MISC Inject 1 application into the skin daily as needed (as directed by your insulin instructions. DX E11.09). 100 each 5  . oxyCODONE-acetaminophen (PERCOCET/ROXICET) 5-325 MG tablet Take 1 tablet by mouth 2 (two) times daily as needed for severe pain. 60 tablet 0  . RELION PEN NEEDLE 31G/8MM 31G X 8 MM MISC Use to administer insulin four times a day Dx E11.9 100 each 3  . Vitamin D, Ergocalciferol, (DRISDOL) 50000 units CAPS capsule Take 1 capsule (50,000 Units total) by mouth once a week. 12 capsule 0  . gabapentin (NEURONTIN) 300 MG capsule Take 1 capsule (300 mg total) by mouth at bedtime. 30 capsule 0  . docusate sodium (COLACE) 100 MG capsule Take 1 capsule (100 mg total) by mouth 2 (two) times daily. 10 capsule 0   No facility-administered medications prior to visit.      Review of Systems  Constitutional: Negative for chills and fever.  Eyes:       Denies changes  in vision  Respiratory: Negative for cough, chest tightness, shortness of breath and wheezing.   Cardiovascular: Negative for chest pain, palpitations and leg swelling.  Endocrine: Negative for polydipsia, polyphagia and polyuria.  Neurological: Negative for dizziness, weakness, light-headedness and numbness.      Objective:    BP (!) 148/80 (BP Location: Left Arm, Patient Position: Sitting, Cuff Size: Normal)   Pulse 82   Temp 97.8 F (36.6 C) (Oral)   Resp 16   Ht '5\' 4"'  (1.626 m)   Wt 199 lb 1.9 oz (90.3 kg)   SpO2 98%   BMI 34.18 kg/m  Nursing note and vital signs reviewed.  Physical Exam  Constitutional: She is oriented to person, place, and time. She appears well-developed and  well-nourished. No distress.  Cardiovascular: Normal rate, regular rhythm, normal heart sounds and intact distal pulses.   Pulmonary/Chest: Effort normal and breath sounds normal.  Neurological: She is alert and oriented to person, place, and time.  Skin: Skin is warm and dry.  Psychiatric: She has a normal mood and affect. Her behavior is normal. Judgment and thought content normal.       Assessment & Plan:   Problem List Items Addressed This Visit      Cardiovascular and Mediastinum   HTN (hypertension)    Blood pressure remains elevated above goal 140/90 with current regimen and no adverse side effects. Start losartan. Consider discontinuation of amlodipine or hydralazine of blood pressure is improved control with additional medication. Encouraged to follow low-sodium diet. Monitor blood pressure at home as able.      Relevant Medications   losartan (COZAAR) 50 MG tablet   Diabetes mellitus with peripheral vascular disease ([place]) - Primary    Type 2 diabetes remains labile based on home blood sugars and previous A1c of 8.9. Obtain renal panel and A1c. Start losartan for CAD risk reduction. Maintained on atorvastatin for CAD risk reduction. Working to complete her diabetic eye exam and she had to cancel an appointment previously. Continue current dosage of metformin. Increase NovoLog Mix 75/25 to 15 units twice daily. Continue to monitor blood sugars at home. Pneumovax is up-to-date. Continue to monitor.       Relevant Medications   losartan (COZAAR) 50 MG tablet   Other Relevant Orders   Hemoglobin A1c (Completed)   Renal Function Panel (Completed)    Other Visit Diagnoses   None.      I have discontinued Ms. Stagliano's docusate sodium and gabapentin. I am also having her start on losartan. Additionally, I am having her maintain her Melatonin, glucose monitoring kit, aspirin EC, loratadine, Needle (Disp), metFORMIN, hydrALAZINE, FREESTYLE LITE, Insulin Lispro Prot & Lispro,  atorvastatin, carvedilol, RELION PEN NEEDLE 31G/8MM, Vitamin D (Ergocalciferol), amLODipine, and oxyCODONE-acetaminophen.   Meds ordered this encounter  Medications  . losartan (COZAAR) 50 MG tablet    Sig: Take 1 tablet (50 mg total) by mouth daily.    Dispense:  90 tablet    Refill:  3    Order Specific Question:   Supervising Provider    Answer:   Pricilla Holm A [[id number]]     Follow-up: Return in about 3 months (around 04/18/2016), or if symptoms worsen or fail to improve.  Mauricio Po, FNP

## 2016-01-17 NOTE — Assessment & Plan Note (Signed)
Blood pressure remains elevated above goal 140/90 with current regimen and no adverse side effects. Start losartan. Consider discontinuation of amlodipine or hydralazine of blood pressure is improved control with additional medication. Encouraged to follow low-sodium diet. Monitor blood pressure at home as able.

## 2016-01-17 NOTE — Assessment & Plan Note (Signed)
Type 2 diabetes remains labile based on home blood sugars and previous A1c of 8.9. Obtain renal panel and A1c. Start losartan for CAD risk reduction. Maintained on atorvastatin for CAD risk reduction. Working to complete her diabetic eye exam and she had to cancel an appointment previously. Continue current dosage of metformin. Increase NovoLog Mix 75/25 to 15 units twice daily. Continue to monitor blood sugars at home. Pneumovax is up-to-date. Continue to monitor.

## 2016-01-21 ENCOUNTER — Ambulatory Visit: Payer: 59 | Admitting: Family Medicine

## 2016-02-04 ENCOUNTER — Telehealth: Payer: Self-pay | Admitting: *Deleted

## 2016-02-04 DIAGNOSIS — I96 Gangrene, not elsewhere classified: Secondary | ICD-10-CM

## 2016-02-04 DIAGNOSIS — I10 Essential (primary) hypertension: Secondary | ICD-10-CM

## 2016-02-04 MED ORDER — GLUCOSE BLOOD VI STRP: ORAL_STRIP | 3 refills | Status: DC

## 2016-02-04 MED ORDER — OXYCODONE-ACETAMINOPHEN 5-325 MG PO TABS: 1.0000 | ORAL_TABLET | Freq: Two times a day (BID) | ORAL | 0 refills | Status: DC | PRN

## 2016-02-04 MED ORDER — HYDRALAZINE HCL 50 MG PO TABS: 75.0000 mg | ORAL_TABLET | Freq: Three times a day (TID) | ORAL | 2 refills | Status: DC

## 2016-02-04 NOTE — Telephone Encounter (Signed)
Rec'd call pt is requesting refill on her testing strips, hydralazine, and oxycodone. Sent maintenance pls advise on control...[name]/lmb

## 2016-02-04 NOTE — Telephone Encounter (Signed)
Medication refilled

## 2016-02-05 NOTE — Telephone Encounter (Signed)
Notified pt rx ready for pick-up.../lmb 

## 2016-02-13 ENCOUNTER — Other Ambulatory Visit (HOSPITAL_COMMUNITY): Payer: 59

## 2016-02-13 ENCOUNTER — Encounter (HOSPITAL_COMMUNITY): Payer: 59

## 2016-02-13 ENCOUNTER — Ambulatory Visit: Payer: 59 | Admitting: Vascular Surgery

## 2016-03-03 ENCOUNTER — Telehealth: Payer: Self-pay | Admitting: *Deleted

## 2016-03-03 DIAGNOSIS — I96 Gangrene, not elsewhere classified: Secondary | ICD-10-CM

## 2016-03-03 MED ORDER — OXYCODONE-ACETAMINOPHEN 5-325 MG PO TABS: 1.0000 | ORAL_TABLET | Freq: Two times a day (BID) | ORAL | 0 refills | Status: DC | PRN

## 2016-03-03 MED ORDER — INSULIN LISPRO PROT & LISPRO (75-25 MIX) 100 UNIT/ML KWIKPEN: PEN_INJECTOR | SUBCUTANEOUS | 4 refills | Status: DC

## 2016-03-03 MED ORDER — METFORMIN HCL ER 500 MG PO TB24: 500.0000 mg | ORAL_TABLET | Freq: Two times a day (BID) | ORAL | 0 refills | Status: DC

## 2016-03-03 NOTE — Telephone Encounter (Signed)
Left msg on triage requesting refills on her Metformin, Humalog and Oxycodone. Sent refills on maintenance meds pls advise on pain med...Shirley Chen/lmb

## 2016-03-03 NOTE — Telephone Encounter (Signed)
Medication refilled

## 2016-03-04 NOTE — Telephone Encounter (Signed)
Notified pt rx ready for pick-up.../lmb 

## 2016-03-12 ENCOUNTER — Encounter: Payer: Self-pay | Admitting: Vascular Surgery

## 2016-03-19 ENCOUNTER — Encounter: Payer: Self-pay | Admitting: Vascular Surgery

## 2016-03-19 ENCOUNTER — Ambulatory Visit (INDEPENDENT_AMBULATORY_CARE_PROVIDER_SITE_OTHER): Payer: 59 | Admitting: Vascular Surgery

## 2016-03-19 ENCOUNTER — Ambulatory Visit (HOSPITAL_COMMUNITY)
Admission: RE | Admit: 2016-03-19 | Discharge: 2016-03-19 | Disposition: A | Discharge status: Home / Self Care | Payer: 59 | Source: Ambulatory Visit | Attending: Vascular Surgery | Admitting: Vascular Surgery

## 2016-03-19 ENCOUNTER — Ambulatory Visit (INDEPENDENT_AMBULATORY_CARE_PROVIDER_SITE_OTHER)
Admission: RE | Admit: 2016-03-19 | Discharge: 2016-03-19 | Disposition: A | Discharge status: Home / Self Care | Payer: 59 | Source: Ambulatory Visit | Attending: Vascular Surgery | Admitting: Vascular Surgery

## 2016-03-19 VITALS — BP 132/83 | HR 82 | Temp 97.7°F | Resp 18 | Ht 64.0 in | Wt 197.7 lb

## 2016-03-19 DIAGNOSIS — Z48812 Encounter for surgical aftercare following surgery on the circulatory system: Secondary | ICD-10-CM | POA: Diagnosis not present

## 2016-03-19 DIAGNOSIS — I7025 Atherosclerosis of native arteries of other extremities with ulceration: Secondary | ICD-10-CM | POA: Insufficient documentation

## 2016-03-19 NOTE — Progress Notes (Signed)
Patient name: Shirley Chen MRN: [id number] DOB: May 17, 1955 Sex: female  REASON FOR VISIT: Follow up of right femoral to anterior tibial artery bypass.  HPI: Shirley Chen is a 60 y.o. female who underwent a right femoral to anterior tibial artery bypass with a vein graft on 10/27/2014. She also had amputation of the right great toe on 11/28/2014. She comes in for a routine follow up visit. At that time we discussed the importance of tobacco cessation. We are following her on our graft protocol. She comes in for a routine visit.  Since I saw her last, she has no symptoms in the right lower extremity except for some paresthesias in her foot likely related to neuropathy. She describes some claudication in the left leg which has been stable. She also has some burning pain in the left foot again consistent with neuropathy.  She does continue to smoke.  Past Medical History:  Diagnosis Date  . Arthritis    "joints in hands and right leg ache" (10/20/2014)  . Cellulitis of right foot 10/2013  . Constipation   . Diabetes mellitus without complication ([place])    Type II  . Headache    "maybe weekly" (10/20/2014)  . Hyperlipidemia   . Hypertension   . Tobacco abuse   . UTI (lower urinary tract infection)     Family History  Problem Relation Age of Onset  . Diabetes Mother   . Alcohol abuse Father   . Hyperlipidemia Father   . Heart disease Father   . Hypertension Father   . Heart attack Father     Late 7s  . Lung cancer Brother     SOCIAL HISTORY: Social History  Substance Use Topics  . Smoking status: Current Every Day Smoker    Packs/day: 0.50    Years: 49.00    Types: Cigarettes    Last attempt to quit: 11/08/2014  . Smokeless tobacco: Never Used  . Alcohol use 0.0 oz/week     Comment: occasional    Allergies  Allergen Reactions  . Morphine And Related     Pt states " I just got really sick"    Current Outpatient Prescriptions  Medication Sig Dispense Refill  .  amLODipine (NORVASC) 10 MG tablet Take 1 tablet (10 mg total) by mouth daily. 90 tablet 1  . aspirin EC 81 MG tablet Take 1 tablet (81 mg total) by mouth daily. 30 tablet 0  . atorvastatin (LIPITOR) 10 MG tablet Take 1 tablet (10 mg total) by mouth daily. 90 tablet 1  . carvedilol (COREG) 25 MG tablet Take 1 tablet (25 mg total) by mouth 2 (two) times daily with a meal. 180 tablet 1  . glucose blood (FREESTYLE LITE) test strip USE ONE STRIP TO TEST BLOOD SUGAR THREE TIMES A DAY. Dx E11.9 100 each 3  . glucose monitoring kit (FREESTYLE) monitoring kit 1 each by Does not apply route 4 (four) times daily - after meals and at bedtime. 1 month Diabetic Testing Supplies for QAC-QHS accuchecks. Any brand OK. Diagnosis E11.65 1 each 1  . hydrALAZINE (APRESOLINE) 50 MG tablet Take 1.5 tablets (75 mg total) by mouth 3 (three) times daily. 270 tablet 2  . Insulin Lispro Prot & Lispro (HUMALOG 75/25 MIX) (75-25) 100 UNIT/ML Kwikpen Please inject 20 units subcutaneously in the am and 15 units in the evening. 15 mL 4  . Melatonin 10 MG TBDP Take 20 mg by mouth at bedtime.    . metFORMIN (GLUCOPHAGE-XR) 500 MG 24  hr tablet Take 1 tablet (500 mg total) by mouth 2 (two) times daily. 180 tablet 0  . oxyCODONE-acetaminophen (PERCOCET/ROXICET) 5-325 MG tablet Take 1 tablet by mouth 2 (two) times daily as needed for severe pain. 60 tablet 0  . RELION PEN NEEDLE 31G/8MM 31G X 8 MM MISC Use to administer insulin four times a day Dx E11.9 100 each 3  . Vitamin D, Ergocalciferol, (DRISDOL) 50000 units CAPS capsule Take 1 capsule (50,000 Units total) by mouth once a week. 12 capsule 0  . loratadine (CLARITIN) 10 MG tablet Take 1 tablet (10 mg total) by mouth daily. (Patient not taking: Reported on 03/19/2016)    . losartan (COZAAR) 50 MG tablet Take 1 tablet (50 mg total) by mouth daily. (Patient not taking: Reported on 03/19/2016) 90 tablet 3  . Needle, Disp, 31G X 5/16" MISC Inject 1 application into the skin daily as  needed (as directed by your insulin instructions. DX E11.09). (Patient not taking: Reported on 03/19/2016) 100 each 5   No current facility-administered medications for this visit.     REVIEW OF SYSTEMS:  _0  denotes positive finding, _1  denotes negative finding Cardiac  Comments:  Chest pain or chest pressure:    Shortness of breath upon exertion:    Short of breath when lying flat:    Irregular heart rhythm:        Vascular    Pain in calf, thigh, or hip brought on by ambulation: X Left calf   Pain in feet at night that wakes you up from your sleep:     Blood clot in your veins:    Leg swelling:         Pulmonary    Oxygen at home:    Productive cough:     Wheezing:         Neurologic    Sudden weakness in arms or legs:     Sudden numbness in arms or legs:     Sudden onset of difficulty speaking or slurred speech:    Temporary loss of vision in one eye:     Problems with dizziness:         Gastrointestinal    Blood in stool:     Vomited blood:         Genitourinary    Burning when urinating:     Blood in urine:        Psychiatric    Major depression:         Hematologic    Bleeding problems:    Problems with blood clotting too easily:        Skin    Rashes or ulcers:        Constitutional    Fever or chills:      PHYSICAL EXAM: Vitals:   03/19/16 1114  BP: 132/83  Pulse: 82  Resp: 18  Temp: 97.7 F (36.5 C)  TempSrc: Oral  SpO2: 96%  Weight: 197 lb 11.2 oz (89.7 kg)  Height: _2  (1.626 m)    GENERAL: The patient is a well-nourished female, in no acute distress. The vital signs are documented above. CARDIAC: There is a regular rate and rhythm.  VASCULAR: I do not detect carotid bruits. On the right side, she has a palpable femoral, graft, and dorsalis pedis pulse. On the left side, she has a palpable femoral pulse. I cannot palpate popliteal or pedal pulses. Both feet are warm and well-perfused. There is no significant lower extremity  swelling.  PULMONARY: There is good air exchange bilaterally without wheezing or rales. ABDOMEN: Soft and non-tender with normal pitched bowel sounds.  MUSCULOSKELETAL: She has had amputation of the right great toe which is healed. NEUROLOGIC: No focal weakness or paresthesias are detected. SKIN: There are no ulcers or rashes noted. PSYCHIATRIC: The patient has a normal affect.  DATA:   LOWER EXTREMITY ARTERIAL DUPLEX: I have independently interpreted the duplex of her bypass graft in the right leg. There is triphasic flow throughout the entire graft with no areas of stenosis identified.  BILATERAL LOWER EXTREMITY ARTERIAL DOPPLER STUDY: I have independently interpreted her lower extremity arterial Doppler study. There are triphasic Doppler signals in the right foot with an ABI of 100%. There are monophasic Doppler signals on the left with an ABI of 83%.  MEDICAL ISSUES:  STATUS POST RIGHT FEMORAL TO ANTERIOR TIBIAL ARTERY BYPASS: Her bypass graft is working well. There are no areas of concern. We have again discussed the importance of tobacco cessation. We will continue to follow her graft on our protocol and I will have her follow up with our nurse practitioner PA in 6 months. She knows to call sooner if she has problems. Of note, she is on aspirin and is on a statin.  Deitra Mayo [facility] [phone]

## 2016-03-25 NOTE — Addendum Note (Signed)
Addended by: Burton ApleyPETTY, Keturah Yerby A on: 03/25/2016 10:49 AM   Modules accepted: Orders

## 2016-04-02 ENCOUNTER — Telehealth: Payer: Self-pay | Admitting: *Deleted

## 2016-04-02 DIAGNOSIS — I10 Essential (primary) hypertension: Secondary | ICD-10-CM

## 2016-04-02 DIAGNOSIS — I96 Gangrene, not elsewhere classified: Secondary | ICD-10-CM

## 2016-04-02 MED ORDER — VITAMIN D (ERGOCALCIFEROL) 1.25 MG (50000 UNIT) PO CAPS: 50000.0000 [IU] | ORAL_CAPSULE | ORAL | 0 refills | Status: DC

## 2016-04-02 MED ORDER — AMLODIPINE BESYLATE 10 MG PO TABS: 10.0000 mg | ORAL_TABLET | Freq: Every day | ORAL | 2 refills | Status: DC

## 2016-04-02 MED ORDER — CARVEDILOL 25 MG PO TABS: 25.0000 mg | ORAL_TABLET | Freq: Two times a day (BID) | ORAL | 2 refills | Status: DC

## 2016-04-02 MED ORDER — ATORVASTATIN CALCIUM 10 MG PO TABS: 10.0000 mg | ORAL_TABLET | Freq: Every day | ORAL | 2 refills | Status: DC

## 2016-04-02 NOTE — Telephone Encounter (Signed)
Left msg on triage stating needing refills on her Amlodipine, Atorvastatin, Carvedilol, Vitamin D, and pain med. Sent maintenance. Pt is not due for pain med until 04/08/16 will hold for approval when Tammy SoursGreg return back in the office on Friday 12/29...[name]/lmb

## 2016-04-03 MED ORDER — OXYCODONE-ACETAMINOPHEN 5-325 MG PO TABS: 1.0000 | ORAL_TABLET | Freq: Two times a day (BID) | ORAL | 0 refills | Status: DC | PRN

## 2016-04-03 NOTE — Telephone Encounter (Signed)
Medication refilled and dated per [place] Controlled Substance Database.

## 2016-05-05 ENCOUNTER — Telehealth: Payer: Self-pay | Admitting: *Deleted

## 2016-05-05 DIAGNOSIS — I96 Gangrene, not elsewhere classified: Secondary | ICD-10-CM

## 2016-05-05 NOTE — Telephone Encounter (Signed)
Left msg on triage stating she has an appt for 2/14, but she is needing refills on her pain med, testing strips, metformin, and humalog. Per chart pt has refills on maintenance meds already pls advise on the oxycodone...[name]/lmb

## 2016-05-06 MED ORDER — OXYCODONE-ACETAMINOPHEN 5-325 MG PO TABS: 1.0000 | ORAL_TABLET | Freq: Two times a day (BID) | ORAL | 0 refills | Status: DC | PRN

## 2016-05-06 NOTE — Telephone Encounter (Signed)
Medication refilled. Due for UDS please.

## 2016-05-07 NOTE — Telephone Encounter (Signed)
Notified pt rx ready for pick-up.../lmb 

## 2016-05-09 DIAGNOSIS — Z79891 Long term (current) use of opiate analgesic: Secondary | ICD-10-CM | POA: Diagnosis not present

## 2016-05-15 ENCOUNTER — Encounter: Payer: Self-pay | Admitting: Cardiology

## 2016-05-21 ENCOUNTER — Ambulatory Visit (INDEPENDENT_AMBULATORY_CARE_PROVIDER_SITE_OTHER): Payer: 59 | Admitting: Family

## 2016-05-21 ENCOUNTER — Encounter: Payer: Self-pay | Admitting: Family

## 2016-05-21 VITALS — BP 142/78 | HR 80 | Temp 98.0°F | Resp 16 | Ht 64.0 in | Wt 202.8 lb

## 2016-05-21 DIAGNOSIS — E1159 Type 2 diabetes mellitus with other circulatory complications: Secondary | ICD-10-CM

## 2016-05-21 DIAGNOSIS — I96 Gangrene, not elsewhere classified: Secondary | ICD-10-CM | POA: Diagnosis not present

## 2016-05-21 DIAGNOSIS — Z794 Long term (current) use of insulin: Secondary | ICD-10-CM | POA: Diagnosis not present

## 2016-05-21 LAB — POCT GLYCOSYLATED HEMOGLOBIN (HGB A1C): Hemoglobin A1C: 8.4

## 2016-05-21 MED ORDER — OXYCODONE-ACETAMINOPHEN 5-325 MG PO TABS: 1.0000 | ORAL_TABLET | Freq: Two times a day (BID) | ORAL | 0 refills | Status: DC | PRN

## 2016-05-21 NOTE — Assessment & Plan Note (Signed)
Type 2 diabetes remains uncontrolled with hemoglobin A1c of 8.4 with current regimen and no adverse side effects. Increase morning dosage of insulin 75/25-25 units and keep evening dosage to 15 units. Continue to monitor blood sugars 2 times per day. Continue current dosage of metformin. Diabetic foot exam completed today. Refer to ophthalmology for diabetic eye exam. Maintained on atorvastatin for CAD risk reduction. Follow-up in one month or sooner pending changes to medication.

## 2016-05-21 NOTE — Patient Instructions (Addendum)
Thank you for choosing [facility].  SUMMARY AND INSTRUCTIONS:  Increase morning insulin to 25 units and increase 1-2 units per day for nighttime readings of greater than 150.   Keep the night time dosage at 15 units.    Medication:  Your prescription(s) have been submitted to your pharmacy or been printed and provided for you. Please take as directed and contact our office if you believe you are having problem(s) with the medication(s) or have any questions.  Follow up:  If your symptoms worsen or fail to improve, please contact our office for further instruction, or in case of emergency go directly to the emergency room at the closest medical facility.   DIABETES CARE INSTRUCTIONS:  Current A1c:  Lab Results  Component Value Date   HGBA1C 8.4 05/21/2016    A1C Goal: <7.0%  Fasting Blood Sugar Goal: 80-130 Blood sugar check that you take after fasting for at least 8 hours which is usually in the morning before eating.  Post-prandial Blood Sugar Goal: <180 Blood sugar check approximately 1-2 hours after the start of a meal.   Diabetes Prevention Screens:  1.) Ensure you have an annual eye exam by an ophthalmologist or optometrist.   2,) Ensure you have an annual foot exam or when any changes are noted including new onset numbness/tingling or wounds. Check your feet daily!  3.) The American Diabetes Association recommends the Pneumovax vaccination against pneumonia every 5 years.  4.) We will check your kidney function with a urine test on an annual basis.

## 2016-05-21 NOTE — Progress Notes (Signed)
Subjective:    Patient ID: Shirley Chen, female    DOB: 1955-12-23, 61 y.o.   MRN: [id number]  Chief Complaint  Patient presents with  . Follow-up    diabetes    HPI:  Shirley Chen is a 61 y.o. female who  has a past medical history of Arthritis; Cellulitis of right foot (10/2013); Constipation; Diabetes mellitus without complication ([place]); Headache; Hyperlipidemia; Hypertension; Tobacco abuse; and UTI (lower urinary tract infection). and presents today for a follow up appointment.   Type 2 diabetes - currently maintained on metformin and Humalog 75/25. Currently taking 20 units in the morning and 15 units in the evening. Blood sugars have been around mid - 100's in the morning and higher levels in the evening around 200 on average. She eats when she takes the medication. No blurred vision or numbness or tingling. Nutritionally consuming decreased carbohydrate. No structured exercise but does take care of an 61 year old.   Lab Results  Component Value Date   HGBA1C 8.4 05/21/2016      Allergies  Allergen Reactions  . Losartan Nausea And Vomiting  . Morphine And Related     Pt states " I just got really sick"      Outpatient Medications Prior to Visit  Medication Sig Dispense Refill  . amLODipine (NORVASC) 10 MG tablet Take 1 tablet (10 mg total) by mouth daily. 90 tablet 2  . aspirin EC 81 MG tablet Take 1 tablet (81 mg total) by mouth daily. 30 tablet 0  . atorvastatin (LIPITOR) 10 MG tablet Take 1 tablet (10 mg total) by mouth daily. 90 tablet 2  . carvedilol (COREG) 25 MG tablet Take 1 tablet (25 mg total) by mouth 2 (two) times daily with a meal. 180 tablet 2  . glucose blood (FREESTYLE LITE) test strip USE ONE STRIP TO TEST BLOOD SUGAR THREE TIMES A DAY. Dx E11.9 100 each 3  . glucose monitoring kit (FREESTYLE) monitoring kit 1 each by Does not apply route 4 (four) times daily - after meals and at bedtime. 1 month Diabetic Testing Supplies for QAC-QHS accuchecks. Any  brand OK. Diagnosis E11.65 1 each 1  . hydrALAZINE (APRESOLINE) 50 MG tablet Take 1.5 tablets (75 mg total) by mouth 3 (three) times daily. 270 tablet 2  . Insulin Lispro Prot & Lispro (HUMALOG 75/25 MIX) (75-25) 100 UNIT/ML Kwikpen Please inject 20 units subcutaneously in the am and 15 units in the evening. 15 mL 4  . loratadine (CLARITIN) 10 MG tablet Take 1 tablet (10 mg total) by mouth daily.    . Melatonin 10 MG TBDP Take 20 mg by mouth at bedtime.    . metFORMIN (GLUCOPHAGE-XR) 500 MG 24 hr tablet Take 1 tablet (500 mg total) by mouth 2 (two) times daily. 180 tablet 0  . Needle, Disp, 31G X 5/16" MISC Inject 1 application into the skin daily as needed (as directed by your insulin instructions. DX E11.09). 100 each 5  . RELION PEN NEEDLE 31G/8MM 31G X 8 MM MISC Use to administer insulin four times a day Dx E11.9 100 each 3  . Vitamin D, Ergocalciferol, (DRISDOL) 50000 units CAPS capsule Take 1 capsule (50,000 Units total) by mouth once a week. 12 capsule 0  . losartan (COZAAR) 50 MG tablet Take 1 tablet (50 mg total) by mouth daily. 90 tablet 3  . oxyCODONE-acetaminophen (PERCOCET/ROXICET) 5-325 MG tablet Take 1 tablet by mouth 2 (two) times daily as needed for severe pain. 60 tablet 0  No facility-administered medications prior to visit.      Review of Systems  Constitutional: Negative for chills, fatigue and fever.  Eyes:       Denies changes in vision  Respiratory: Negative for chest tightness and shortness of breath.   Cardiovascular: Negative for chest pain, palpitations and leg swelling.  Endocrine: Negative for polydipsia, polyphagia and polyuria.  Neurological: Negative for numbness.      Objective:    BP (!) 142/78 (BP Location: Left Arm, Patient Position: Sitting, Cuff Size: Large)   Pulse 80   Temp 98 F (36.7 C) (Oral)   Resp 16   Ht '5\' 4"'  (1.626 m)   Wt 202 lb 12.8 oz (92 kg)   SpO2 97%   BMI 34.81 kg/m  Nursing note and vital signs reviewed.  Physical Exam   Constitutional: She is oriented to person, place, and time. She appears well-developed and well-nourished. No distress.  Cardiovascular: Normal rate, regular rhythm, normal heart sounds and intact distal pulses.   Pulmonary/Chest: Effort normal and breath sounds normal.  Neurological: She is alert and oriented to person, place, and time.  Skin: Skin is warm and dry.  Psychiatric: She has a normal mood and affect. Her behavior is normal. Judgment and thought content normal.       Assessment & Plan:   Problem List Items Addressed This Visit      Endocrine   Type 2 diabetes mellitus ([place]) - Primary    Type 2 diabetes remains uncontrolled with hemoglobin A1c of 8.4 with current regimen and no adverse side effects. Increase morning dosage of insulin 75/25-25 units and keep evening dosage to 15 units. Continue to monitor blood sugars 2 times per day. Continue current dosage of metformin. Diabetic foot exam completed today. Refer to ophthalmology for diabetic eye exam. Maintained on atorvastatin for CAD risk reduction. Follow-up in one month or sooner pending changes to medication.      Relevant Orders   POCT HgB A1C (Completed)   Ambulatory referral to Ophthalmology     Other   Gangrene of toe: Right great toe with osteomyelitis   Relevant Medications   oxyCODONE-acetaminophen (PERCOCET/ROXICET) 5-325 MG tablet       I have discontinued Ms. Evitt's losartan. I am also having her maintain her Melatonin, glucose monitoring kit, aspirin EC, loratadine, Needle (Disp), RELION PEN NEEDLE 31G/8MM, glucose blood, hydrALAZINE, Insulin Lispro Prot & Lispro, metFORMIN, amLODipine, atorvastatin, Vitamin D (Ergocalciferol), carvedilol, and oxyCODONE-acetaminophen.   Meds ordered this encounter  Medications  . oxyCODONE-acetaminophen (PERCOCET/ROXICET) 5-325 MG tablet    Sig: Take 1 tablet by mouth 2 (two) times daily as needed for severe pain.    Dispense:  60 tablet    Refill:  0    Fill on  or after 06/09/16    Order Specific Question:   Supervising Provider    Answer:   Pricilla Holm A [[id number]     Follow-up: Return if symptoms worsen or fail to improve.  Mauricio Po, FNP

## 2016-07-02 ENCOUNTER — Encounter (HOSPITAL_COMMUNITY): Payer: Self-pay | Admitting: Emergency Medicine

## 2016-07-02 DIAGNOSIS — E119 Type 2 diabetes mellitus without complications: Secondary | ICD-10-CM | POA: Insufficient documentation

## 2016-07-02 DIAGNOSIS — Z79899 Other long term (current) drug therapy: Secondary | ICD-10-CM | POA: Insufficient documentation

## 2016-07-02 DIAGNOSIS — T391X1A Poisoning by 4-Aminophenol derivatives, accidental (unintentional), initial encounter: Secondary | ICD-10-CM | POA: Diagnosis not present

## 2016-07-02 DIAGNOSIS — I10 Essential (primary) hypertension: Secondary | ICD-10-CM | POA: Diagnosis not present

## 2016-07-02 DIAGNOSIS — Z794 Long term (current) use of insulin: Secondary | ICD-10-CM | POA: Insufficient documentation

## 2016-07-02 DIAGNOSIS — F17210 Nicotine dependence, cigarettes, uncomplicated: Secondary | ICD-10-CM | POA: Insufficient documentation

## 2016-07-02 DIAGNOSIS — Z7982 Long term (current) use of aspirin: Secondary | ICD-10-CM | POA: Diagnosis not present

## 2016-07-02 DIAGNOSIS — T402X1A Poisoning by other opioids, accidental (unintentional), initial encounter: Secondary | ICD-10-CM | POA: Diagnosis not present

## 2016-07-02 LAB — ACETAMINOPHEN LEVEL: Acetaminophen (Tylenol), Serum: 37 ug/mL — ABNORMAL HIGH (ref 10–30)

## 2016-07-02 NOTE — ED Triage Notes (Signed)
Patient accidentally took 10 percocet about 90 min ago. She was preparing daily meds and made error when taking correct pills

## 2016-07-03 ENCOUNTER — Emergency Department (HOSPITAL_COMMUNITY)
Admission: EM | Admit: 2016-07-03 | Discharge: 2016-07-03 | Disposition: A | Discharge status: Home / Self Care | Payer: 59 | Attending: Emergency Medicine | Admitting: Emergency Medicine

## 2016-07-03 ENCOUNTER — Telehealth: Payer: Self-pay | Admitting: Family

## 2016-07-03 DIAGNOSIS — T50901A Poisoning by unspecified drugs, medicaments and biological substances, accidental (unintentional), initial encounter: Secondary | ICD-10-CM

## 2016-07-03 NOTE — ED Notes (Signed)
ED Provider at bedside. 
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
## 2016-07-03 NOTE — Telephone Encounter (Signed)
Pt called requesting Refill of oxyCODONE-acetaminophen (PERCOCET/ROXICET and Vitamin D, Ergocalciferol. Please advise

## 2016-07-07 MED ORDER — VITAMIN D (ERGOCALCIFEROL) 1.25 MG (50000 UNIT) PO CAPS: 50000.0000 [IU] | ORAL_CAPSULE | ORAL | 0 refills | Status: DC

## 2016-07-07 NOTE — Telephone Encounter (Signed)
Pt aware of the response below. Was not happy about the response and stated that it was an accidental overdose. I told her to wait until Tammy Sours gets back on Friday for a response from him. She stated "So I have to suffer until then." and hung up.

## 2016-07-07 NOTE — Telephone Encounter (Signed)
Will defer to PCP with drug overdose in the ER 07/03/16 for taking too many percocet. PCP will be back Friday which is only 1 day late which is acceptable.

## 2016-07-07 NOTE — Telephone Encounter (Signed)
Please advise. Pt last had pain med filled on 06/09/16 per [place] controlled substance database.

## 2016-07-07 NOTE — Telephone Encounter (Signed)
Noted and will be addressed.

## 2016-08-11 DIAGNOSIS — E113293 Type 2 diabetes mellitus with mild nonproliferative diabetic retinopathy without macular edema, bilateral: Secondary | ICD-10-CM | POA: Diagnosis not present

## 2016-08-11 LAB — HM DIABETES EYE EXAM

## 2016-08-13 ENCOUNTER — Encounter: Payer: Self-pay | Admitting: Family

## 2016-09-08 ENCOUNTER — Encounter: Payer: Self-pay | Admitting: Family

## 2016-09-08 ENCOUNTER — Ambulatory Visit (INDEPENDENT_AMBULATORY_CARE_PROVIDER_SITE_OTHER): Payer: 59 | Admitting: Family

## 2016-09-08 ENCOUNTER — Other Ambulatory Visit (INDEPENDENT_AMBULATORY_CARE_PROVIDER_SITE_OTHER): Payer: 59

## 2016-09-08 VITALS — BP 138/72 | HR 87 | Temp 98.7°F | Resp 14 | Ht 64.0 in | Wt 205.1 lb

## 2016-09-08 DIAGNOSIS — E1151 Type 2 diabetes mellitus with diabetic peripheral angiopathy without gangrene: Secondary | ICD-10-CM

## 2016-09-08 DIAGNOSIS — I10 Essential (primary) hypertension: Secondary | ICD-10-CM

## 2016-09-08 LAB — COMPREHENSIVE METABOLIC PANEL
ALT: 10 U/L (ref 0–35)
AST: 9 U/L (ref 0–37)
Albumin: 3.8 g/dL (ref 3.5–5.2)
Alkaline Phosphatase: 95 U/L (ref 39–117)
BUN: 30 mg/dL — ABNORMAL HIGH (ref 6–23)
CO2: 22 mEq/L (ref 19–32)
Calcium: 9.5 mg/dL (ref 8.4–10.5)
Chloride: 109 mEq/L (ref 96–112)
Creatinine, Ser: 1.26 mg/dL — ABNORMAL HIGH (ref 0.40–1.20)
GFR: 46.04 mL/min — ABNORMAL LOW (ref >60.00)
Glucose, Bld: 282 mg/dL — ABNORMAL HIGH (ref 70–99)
Potassium: 4.6 mEq/L (ref 3.5–5.1)
Sodium: 140 mEq/L (ref 135–145)
Total Bilirubin: 0.4 mg/dL (ref 0.2–1.2)
Total Protein: 6.4 g/dL (ref 6.0–8.3)

## 2016-09-08 LAB — MICROALBUMIN / CREATININE URINE RATIO
Creatinine,U: 83.4 mg/dL
Microalb Creat Ratio: 7.1 mg/g (ref 0.0–30.0)
Microalb, Ur: 5.9 mg/dL — ABNORMAL HIGH (ref 0.0–1.9)

## 2016-09-08 LAB — HEMOGLOBIN A1C: Hgb A1c MFr Bld: 9 % — ABNORMAL HIGH (ref 4.6–6.5)

## 2016-09-08 MED ORDER — METFORMIN HCL ER 500 MG PO TB24: 500.0000 mg | ORAL_TABLET | Freq: Two times a day (BID) | ORAL | 0 refills | Status: DC

## 2016-09-08 MED ORDER — HYDRALAZINE HCL 50 MG PO TABS: 75.0000 mg | ORAL_TABLET | Freq: Three times a day (TID) | ORAL | 2 refills | Status: DC

## 2016-09-08 MED ORDER — INSULIN LISPRO PROT & LISPRO (75-25 MIX) 100 UNIT/ML KWIKPEN: PEN_INJECTOR | SUBCUTANEOUS | 4 refills | Status: DC

## 2016-09-08 MED ORDER — GABAPENTIN 100 MG PO CAPS: ORAL_CAPSULE | ORAL | 3 refills | Status: DC

## 2016-09-08 NOTE — Progress Notes (Signed)
Subjective:    Patient ID: Shirley Chen, female    DOB: 05/07/1955, 61 y.o.   MRN: [id number]  Chief Complaint  Patient presents with  . Follow-up    diabetes, other options for neuropathy besides oxycodone    HPI:  Shirley Chen is a 61 y.o. female who  has a past medical history of Arthritis; Cellulitis of right foot (10/2013); Constipation; Diabetes mellitus without complication (Shirley Chen); Headache; Hyperlipidemia; Hypertension; Tobacco abuse; and UTI (lower urinary tract infection). and presents today for a follow up office visit.  1.) Diabetes - Currently maintained on Humalog 75/25 and metformin. Taking 25 units in the morning 20 units in the evening. Reports taking the medication as prescribed and denies adverse side effects or hypoglycemic readings. Blood sugars at home have been averaging around 140-150. Marland Kitchen Denies new symptoms of end organ damage but does continue to have neuropathy. No excessive hunger, thirst, or urination. Working on a low/carbohydrate modified oral intake.   Lab Results  Component Value Date   HGBA1C 9.0 (H) 09/08/2016     Lab Results  Component Value Date   CREATININE 1.26 (H) 09/08/2016   BUN 30 (H) 09/08/2016   NA 140 09/08/2016   K 4.6 09/08/2016   CL 109 09/08/2016   CO2 22 09/08/2016      Allergies  Allergen Reactions  . Losartan Nausea And Vomiting  . Morphine And Related     Pt states " I just got really sick"      Outpatient Medications Prior to Visit  Medication Sig Dispense Refill  . amLODipine (NORVASC) 10 MG tablet Take 1 tablet (10 mg total) by mouth daily. 90 tablet 2  . aspirin EC 81 MG tablet Take 1 tablet (81 mg total) by mouth daily. 30 tablet 0  . atorvastatin (LIPITOR) 10 MG tablet Take 1 tablet (10 mg total) by mouth daily. 90 tablet 2  . carvedilol (COREG) 25 MG tablet Take 1 tablet (25 mg total) by mouth 2 (two) times daily with a meal. 180 tablet 2  . glucose blood (FREESTYLE LITE) test strip USE ONE STRIP TO TEST  BLOOD SUGAR THREE TIMES A DAY. Dx E11.9 100 each 3  . glucose monitoring kit (FREESTYLE) monitoring kit 1 each by Does not apply route 4 (four) times daily - after meals and at bedtime. 1 month Diabetic Testing Supplies for QAC-QHS accuchecks. Any brand OK. Diagnosis E11.65 1 each 1  . Melatonin 10 MG TBDP Take 20 mg by mouth at bedtime.    Marland Kitchen RELION PEN NEEDLE 31G/8MM 31G X 8 MM MISC Use to administer insulin four times a day Dx E11.9 100 each 3  . Vitamin D, Ergocalciferol, (DRISDOL) 50000 units CAPS capsule Take 1 capsule (50,000 Units total) by mouth once a week. 12 capsule 0  . hydrALAZINE (APRESOLINE) 50 MG tablet Take 1.5 tablets (75 mg total) by mouth 3 (three) times daily. 270 tablet 2  . Insulin Lispro Prot & Lispro (HUMALOG 75/25 MIX) (75-25) 100 UNIT/ML Kwikpen Please inject 20 units subcutaneously in the am and 15 units in the evening. 15 mL 4  . metFORMIN (GLUCOPHAGE-XR) 500 MG 24 hr tablet Take 1 tablet (500 mg total) by mouth 2 (two) times daily. 180 tablet 0  . loratadine (CLARITIN) 10 MG tablet Take 1 tablet (10 mg total) by mouth daily.    . Needle, Disp, 31G X 5/16" MISC Inject 1 application into the skin daily as needed (as directed by your insulin instructions. DX E11.09). 100  each 5  . oxyCODONE-acetaminophen (PERCOCET/ROXICET) 5-325 MG tablet Take 1 tablet by mouth 2 (two) times daily as needed for severe pain. 60 tablet 0   No facility-administered medications prior to visit.       Review of Systems  Constitutional: Negative for chills, fatigue and fever.  Eyes:       Denies changes in vision  Respiratory: Negative for chest tightness and shortness of breath.   Cardiovascular: Negative for chest pain, palpitations and leg swelling.  Endocrine: Negative for polydipsia, polyphagia and polyuria.  Neurological: Positive for numbness. Negative for weakness.      Objective:    BP 138/72 (BP Location: Left Arm, Patient Position: Sitting, Cuff Size: Large)   Pulse 87    Temp 98.7 F (37.1 C) (Oral)   Resp 14   Ht _0  (1.626 m)   Wt 205 lb 1.9 oz (93 kg)   SpO2 97%   BMI 35.21 kg/m  Nursing note and vital signs reviewed.  Physical Exam  Constitutional: She is oriented to person, place, and time. She appears well-developed and well-nourished. No distress.  Cardiovascular: Normal rate, regular rhythm, normal heart sounds and intact distal pulses.   Pulmonary/Chest: Effort normal and breath sounds normal.  Neurological: She is alert and oriented to person, place, and time.  Skin: Skin is warm and dry.  Psychiatric: She has a normal mood and affect. Her behavior is normal. Judgment and thought content normal.       Assessment & Plan:   Problem List Items Addressed This Visit      Cardiovascular and Mediastinum   HTN (hypertension)   Relevant Medications   hydrALAZINE (APRESOLINE) 50 MG tablet   Diabetes mellitus with peripheral vascular disease ([place]) - Primary    Obtain hemoglobin A1c, urine microalbumin, and complete metabolic profile. Previous A1c remained elevated although notes improvement in blood sugars averaging approximately 150 at home. Continue current dosage of 75/25 at 25 units in the morning and 20 units in the evening. Instructions for self titration provided. Continue current dosage of metformin. Start gabapentin for diabetic neuropathy. Diabetic foot exam completed today. Diabetic eye exam is up-to-date. Maintained on atorvastatin for CAD risk reduction. Continue to monitor blood sugars at home twice daily.       Relevant Medications   hydrALAZINE (APRESOLINE) 50 MG tablet   metFORMIN (GLUCOPHAGE-XR) 500 MG 24 hr tablet   Insulin Lispro Prot & Lispro (HUMALOG 75/25 MIX) (75-25) 100 UNIT/ML Kwikpen   Other Relevant Orders   Hemoglobin A1c (Completed)   Comprehensive metabolic panel (Completed)   Urine Microalbumin w/creat. ratio (Completed)       I have discontinued Shirley Chen's loratadine, Needle (Disp), and  oxyCODONE-acetaminophen. I have also changed her Insulin Lispro Prot & Lispro. Additionally, I am having her start on gabapentin. Lastly, I am having her maintain her Melatonin, glucose monitoring kit, aspirin EC, RELION PEN NEEDLE 31G/8MM, glucose blood, amLODipine, atorvastatin, carvedilol, Vitamin D (Ergocalciferol), hydrALAZINE, and metFORMIN.   Meds ordered this encounter  Medications  . hydrALAZINE (APRESOLINE) 50 MG tablet    Sig: Take 1.5 tablets (75 mg total) by mouth 3 (three) times daily.    Dispense:  270 tablet    Refill:  2  . metFORMIN (GLUCOPHAGE-XR) 500 MG 24 hr tablet    Sig: Take 1 tablet (500 mg total) by mouth 2 (two) times daily.    Dispense:  180 tablet    Refill:  0  . Insulin Lispro Prot & Lispro (HUMALOG 75/25  MIX) (75-25) 100 UNIT/ML Kwikpen    Sig: Please inject 25 units subcutaneously in the am and 20 units in the evening.    Dispense:  15 mL    Refill:  4    Order Specific Question:   Supervising Provider    Answer:   Pricilla Holm A [[id number]]  . gabapentin (NEURONTIN) 100 MG capsule    Sig: Take 1 capsule by mouth at night for 3 nights then increase as needed to 1 capsule twice daily.    Dispense:  60 capsule    Refill:  3    Order Specific Question:   Supervising Provider    Answer:   Pricilla Holm A [[id number]]     Follow-up: Return in about 3 months (around 12/09/2016), or if symptoms worsen or fail to improve.  Mauricio Po, FNP

## 2016-09-08 NOTE — Assessment & Plan Note (Signed)
Obtain hemoglobin A1c, urine microalbumin, and complete metabolic profile. Previous A1c remained elevated although notes improvement in blood sugars averaging approximately 150 at home. Continue current dosage of 75/25 at 25 units in the morning and 20 units in the evening. Instructions for self titration provided. Continue current dosage of metformin. Start gabapentin for diabetic neuropathy. Diabetic foot exam completed today. Diabetic eye exam is up-to-date. Maintained on atorvastatin for CAD risk reduction. Continue to monitor blood sugars at home twice daily.

## 2016-09-08 NOTE — Patient Instructions (Signed)
Thank you for choosing [facility].  SUMMARY AND INSTRUCTIONS:  Start gabapentin - 100 mg at night, then increase as needed to twice daily, then up to 3 times per day as needed.   Continue to monitor your blood sugars at home.   Medication:  Your prescription(s) have been submitted to your pharmacy or been printed and provided for you. Please take as directed and contact our office if you believe you are having problem(s) with the medication(s) or have any questions.  Labs:  Please stop by the lab on the lower level of the building for your blood work. Your results will be released to MyChart (or called to you) after review, usually within 72 hours after test completion. If any changes need to be made, you will be notified at that same time.  1.) The lab is open from 7:30am to 5:30 pm Monday-Friday 2.) No appointment is necessary 3.) Fasting (if needed) is 6-8 hours after food and drink; black coffee and water are okay   Follow up:  If your symptoms worsen or fail to improve, please contact our office for further instruction, or in case of emergency go directly to the emergency room at the closest medical facility.   DIABETES CARE INSTRUCTIONS:  Current A1c:  Lab Results  Component Value Date   HGBA1C 8.4 05/21/2016    A1C Goal: <7.0%  Fasting Blood Sugar Goal: 80-130 Blood sugar check that you take after fasting for at least 8 hours which is usually in the morning before eating.  Post-prandial Blood Sugar Goal: <180 Blood sugar check approximately 1-2 hours after the start of a meal.    Dosing of Novolog 70/30:     Diabetes Prevention Screens:  1.) Ensure you have an annual eye exam by an ophthalmologist or optometrist.   2,) Ensure you have an annual foot exam or when any changes are noted including new onset numbness/tingling or wounds. Check your feet daily!  3.) The American Diabetes Association recommends the Pneumovax vaccination against pneumonia  every 5 years.  4.) We will check your kidney function with a urine test on an annual basis.

## 2016-09-15 ENCOUNTER — Encounter: Payer: Self-pay | Admitting: Family

## 2016-09-24 ENCOUNTER — Ambulatory Visit (INDEPENDENT_AMBULATORY_CARE_PROVIDER_SITE_OTHER)
Admission: RE | Admit: 2016-09-24 | Discharge: 2016-09-24 | Disposition: A | Discharge status: Home / Self Care | Payer: 59 | Source: Ambulatory Visit | Attending: Vascular Surgery | Admitting: Vascular Surgery

## 2016-09-24 ENCOUNTER — Encounter: Payer: Self-pay | Admitting: Family

## 2016-09-24 ENCOUNTER — Ambulatory Visit (HOSPITAL_COMMUNITY)
Admission: RE | Admit: 2016-09-24 | Discharge: 2016-09-24 | Disposition: A | Discharge status: Home / Self Care | Payer: 59 | Source: Ambulatory Visit | Attending: Vascular Surgery | Admitting: Vascular Surgery

## 2016-09-24 ENCOUNTER — Ambulatory Visit (INDEPENDENT_AMBULATORY_CARE_PROVIDER_SITE_OTHER): Payer: 59 | Admitting: Family

## 2016-09-24 VITALS — BP 121/71 | HR 80 | Temp 97.5°F | Resp 18 | Ht 64.0 in | Wt 210.0 lb

## 2016-09-24 DIAGNOSIS — I779 Disorder of arteries and arterioles, unspecified: Secondary | ICD-10-CM

## 2016-09-24 DIAGNOSIS — E119 Type 2 diabetes mellitus without complications: Secondary | ICD-10-CM | POA: Insufficient documentation

## 2016-09-24 DIAGNOSIS — Z89411 Acquired absence of right great toe: Secondary | ICD-10-CM | POA: Insufficient documentation

## 2016-09-24 DIAGNOSIS — Z48812 Encounter for surgical aftercare following surgery on the circulatory system: Secondary | ICD-10-CM

## 2016-09-24 DIAGNOSIS — E785 Hyperlipidemia, unspecified: Secondary | ICD-10-CM | POA: Insufficient documentation

## 2016-09-24 DIAGNOSIS — Z95828 Presence of other vascular implants and grafts: Secondary | ICD-10-CM | POA: Diagnosis not present

## 2016-09-24 DIAGNOSIS — F17200 Nicotine dependence, unspecified, uncomplicated: Secondary | ICD-10-CM

## 2016-09-24 DIAGNOSIS — I10 Essential (primary) hypertension: Secondary | ICD-10-CM | POA: Insufficient documentation

## 2016-09-24 DIAGNOSIS — L97511 Non-pressure chronic ulcer of other part of right foot limited to breakdown of skin: Secondary | ICD-10-CM

## 2016-09-24 NOTE — Patient Instructions (Signed)
Steps to Quit Smoking Smoking tobacco can be bad for your health. It can also affect almost every organ in your body. Smoking puts you and people around you at risk for many serious long-lasting (chronic) diseases. Quitting smoking is hard, but it is one of the best things that you can do for your health. It is never too late to quit. What are the benefits of quitting smoking? When you quit smoking, you lower your risk for getting serious diseases and conditions. They can include:  Lung cancer or lung disease.  Heart disease.  Stroke.  Heart attack.  Not being able to have children (infertility).  Weak bones (osteoporosis) and broken bones (fractures).  If you have coughing, wheezing, and shortness of breath, those symptoms may get better when you quit. You may also get sick less often. If you are pregnant, quitting smoking can help to lower your chances of having a baby of low birth weight. What can I do to help me quit smoking? Talk with your doctor about what can help you quit smoking. Some things you can do (strategies) include:  Quitting smoking totally, instead of slowly cutting back how much you smoke over a period of time.  Going to in-person counseling. You are more likely to quit if you go to many counseling sessions.  Using resources and support systems, such as: ? Online chats with a counselor. ? Phone quitlines. ? Printed self-help materials. ? Support groups or group counseling. ? Text messaging programs. ? Mobile phone apps or applications.  Taking medicines. Some of these medicines may have nicotine in them. If you are pregnant or breastfeeding, do not take any medicines to quit smoking unless your doctor says it is okay. Talk with your doctor about counseling or other things that can help you.  Talk with your doctor about using more than one strategy at the same time, such as taking medicines while you are also going to in-person counseling. This can help make  quitting easier. What things can I do to make it easier to quit? Quitting smoking might feel very hard at first, but there is a lot that you can do to make it easier. Take these steps:  Talk to your family and friends. Ask them to support and encourage you.  Call phone quitlines, reach out to support groups, or work with a counselor.  Ask people who smoke to not smoke around you.  Avoid places that make you want (trigger) to smoke, such as: ? Bars. ? Parties. ? Smoke-break areas at work.  Spend time with people who do not smoke.  Lower the stress in your life. Stress can make you want to smoke. Try these things to help your stress: ? Getting regular exercise. ? Deep-breathing exercises. ? Yoga. ? Meditating. ? Doing a body scan. To do this, close your eyes, focus on one area of your body at a time from head to toe, and notice which parts of your body are tense. Try to relax the muscles in those areas.  Download or buy apps on your mobile phone or tablet that can help you stick to your quit plan. There are many free apps, such as QuitGuide from the CDC (Centers for Disease Control and Prevention). You can find more support from smokefree.gov and other websites.  This information is not intended to replace advice given to you by your health care provider. Make sure you discuss any questions you have with your health care provider. Document Released: 01/18/2009 Document   Revised: 11/20/2015 Document Reviewed: 08/08/2014 Elsevier Interactive Patient Education  2018 Elsevier Inc.     Peripheral Vascular Disease Peripheral vascular disease (PVD) is a disease of the blood vessels that are not part of your heart and brain. A simple term for PVD is poor circulation. In most cases, PVD narrows the blood vessels that carry blood from your heart to the rest of your body. This can result in a decreased supply of blood to your arms, legs, and internal organs, like your stomach or kidneys.  However, it most often affects a person's lower legs and feet. There are two types of PVD.  Organic PVD. This is the more common type. It is caused by damage to the structure of blood vessels.  Functional PVD. This is caused by conditions that make blood vessels contract and tighten (spasm).  Without treatment, PVD tends to get worse over time. PVD can also lead to acute ischemic limb. This is when an arm or limb suddenly has trouble getting enough blood. This is a medical emergency. Follow these instructions at home:  Take medicines only as told by your doctor.  Do not use any tobacco products, including cigarettes, chewing tobacco, or electronic cigarettes. If you need help quitting, ask your doctor.  Lose weight if you are overweight, and maintain a healthy weight as told by your doctor.  Eat a diet that is low in fat and cholesterol. If you need help, ask your doctor.  Exercise regularly. Ask your doctor for some good activities for you.  Take good care of your feet. ? Wear comfortable shoes that fit well. ? Check your feet often for any cuts or sores. Contact a doctor if:  You have cramps in your legs while walking.  You have leg pain when you are at rest.  You have coldness in a leg or foot.  Your skin changes.  You are unable to get or have an erection (erectile dysfunction).  You have cuts or sores on your feet that are not healing. Get help right away if:  Your arm or leg turns cold and blue.  Your arms or legs become red, warm, swollen, painful, or numb.  You have chest pain or trouble breathing.  You suddenly have weakness in your face, arm, or leg.  You become very confused or you cannot speak.  You suddenly have a very bad headache.  You suddenly cannot see. This information is not intended to replace advice given to you by your health care provider. Make sure you discuss any questions you have with your health care provider. Document Released:  06/18/2009 Document Revised: 08/30/2015 Document Reviewed: 09/01/2013 Elsevier Interactive Patient Education  2017 Elsevier Inc.  
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
## 2016-09-25 NOTE — Addendum Note (Signed)
Addended by: Burton ApleyPETTY, Katiana Ruland A on: 09/25/2016 12:19 PM   Modules accepted: Orders

## 2016-10-07 ENCOUNTER — Other Ambulatory Visit: Payer: Self-pay | Admitting: Family

## 2017-01-02 ENCOUNTER — Telehealth: Payer: Self-pay | Admitting: *Deleted

## 2017-01-02 DIAGNOSIS — I10 Essential (primary) hypertension: Secondary | ICD-10-CM

## 2017-01-02 MED ORDER — CARVEDILOL 25 MG PO TABS: 25.0000 mg | ORAL_TABLET | Freq: Two times a day (BID) | ORAL | 0 refills | Status: DC

## 2017-01-02 MED ORDER — GABAPENTIN 100 MG PO CAPS: 100.0000 mg | ORAL_CAPSULE | Freq: Two times a day (BID) | ORAL | 0 refills | Status: DC

## 2017-01-02 MED ORDER — ATORVASTATIN CALCIUM 10 MG PO TABS: 10.0000 mg | ORAL_TABLET | Freq: Every day | ORAL | 0 refills | Status: DC

## 2017-01-02 MED ORDER — AMLODIPINE BESYLATE 10 MG PO TABS: 10.0000 mg | ORAL_TABLET | Freq: Every day | ORAL | 0 refills | Status: DC

## 2017-01-02 MED ORDER — METFORMIN HCL ER 500 MG PO TB24: 500.0000 mg | ORAL_TABLET | Freq: Two times a day (BID) | ORAL | 0 refills | Status: DC

## 2017-01-02 NOTE — Telephone Encounter (Signed)
Left msg on triage stating needing refills on amlodipine, gabapentin, metformin, atorvastatin, and coreg sent to [facility] on [address]. Reviewed chart pt is due for f/u appt. Sent 30 day until appt...[name]

## 2017-01-05 ENCOUNTER — Other Ambulatory Visit: Payer: Self-pay | Admitting: Family

## 2017-02-12 ENCOUNTER — Encounter: Payer: Self-pay | Admitting: Nurse Practitioner

## 2017-02-12 ENCOUNTER — Ambulatory Visit: Payer: 59 | Admitting: Nurse Practitioner

## 2017-02-12 ENCOUNTER — Other Ambulatory Visit (INDEPENDENT_AMBULATORY_CARE_PROVIDER_SITE_OTHER): Payer: 59

## 2017-02-12 VITALS — BP 136/78 | HR 82 | Temp 98.0°F | Resp 16 | Ht 64.0 in | Wt 215.0 lb

## 2017-02-12 DIAGNOSIS — G59 Mononeuropathy in diseases classified elsewhere: Secondary | ICD-10-CM | POA: Diagnosis not present

## 2017-02-12 DIAGNOSIS — Z794 Long term (current) use of insulin: Secondary | ICD-10-CM | POA: Diagnosis not present

## 2017-02-12 DIAGNOSIS — I10 Essential (primary) hypertension: Secondary | ICD-10-CM

## 2017-02-12 DIAGNOSIS — E1159 Type 2 diabetes mellitus with other circulatory complications: Secondary | ICD-10-CM | POA: Diagnosis not present

## 2017-02-12 DIAGNOSIS — R809 Proteinuria, unspecified: Secondary | ICD-10-CM | POA: Diagnosis not present

## 2017-02-12 LAB — BASIC METABOLIC PANEL
BUN: 24 mg/dL — ABNORMAL HIGH (ref 6–23)
CO2: 30 mEq/L (ref 19–32)
Calcium: 9.7 mg/dL (ref 8.4–10.5)
Chloride: 105 mEq/L (ref 96–112)
Creatinine, Ser: 1.07 mg/dL (ref 0.40–1.20)
GFR: 55.52 mL/min — ABNORMAL LOW (ref >60.00)
Glucose, Bld: 194 mg/dL — ABNORMAL HIGH (ref 70–99)
Potassium: 4.1 mEq/L (ref 3.5–5.1)
Sodium: 141 mEq/L (ref 135–145)

## 2017-02-12 LAB — HEMOGLOBIN A1C: Hgb A1c MFr Bld: 11.3 % — ABNORMAL HIGH (ref 4.6–6.5)

## 2017-02-12 MED ORDER — HYDRALAZINE HCL 50 MG PO TABS: 75.0000 mg | ORAL_TABLET | Freq: Three times a day (TID) | ORAL | 2 refills | Status: DC

## 2017-02-12 MED ORDER — ATORVASTATIN CALCIUM 10 MG PO TABS: 10.0000 mg | ORAL_TABLET | Freq: Every day | ORAL | 0 refills | Status: DC

## 2017-02-12 MED ORDER — GABAPENTIN 100 MG PO CAPS: 300.0000 mg | ORAL_CAPSULE | Freq: Two times a day (BID) | ORAL | 0 refills | Status: DC

## 2017-02-12 MED ORDER — METFORMIN HCL ER 500 MG PO TB24: 500.0000 mg | ORAL_TABLET | Freq: Two times a day (BID) | ORAL | 0 refills | Status: DC

## 2017-02-12 MED ORDER — AMLODIPINE BESYLATE 10 MG PO TABS: 10.0000 mg | ORAL_TABLET | Freq: Every day | ORAL | 0 refills | Status: DC

## 2017-02-12 MED ORDER — CARVEDILOL 25 MG PO TABS: 25.0000 mg | ORAL_TABLET | Freq: Two times a day (BID) | ORAL | 0 refills | Status: DC

## 2017-02-12 MED ORDER — LISINOPRIL 2.5 MG PO TABS: 2.5000 mg | ORAL_TABLET | Freq: Every day | ORAL | 3 refills | Status: DC

## 2017-02-12 MED ORDER — GLUCOSE BLOOD VI STRP: ORAL_STRIP | 3 refills | Status: DC
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
## 2017-02-12 NOTE — Patient Instructions (Signed)
Please head downstairs for lab work.  Make sure that you are not taking over 2,000 IU of vitamin D daily. Try to get 10 minutes of sun a day to boost your Vitamin D level. You may add vitamin B12 1,04200mcg daily to your regimen to see if it will help your numbness and tingling.  Ive increased your prescription of gabapentin to 300mg  twice daily. Try to avoid ibuprofen.  I have sent a new prescription of lisinopril 2.5mg  by mouth daily to your pharmacy to help protect your kidneys.  It was nice to meet you.Thanks for letting me take care of you today :)

## 2017-02-12 NOTE — Assessment & Plan Note (Signed)
Blood pressure maintained below goal of 140/90 on hydralazine, carvedilol, amlodipine. Denies headaches, vision changes, chest pain, shortness of breath. Encouraged maintenance of healthy, reduced sodium diet and exercise. She'll continue current dosage of medications. BMET today.  Follow up in 3 months.

## 2017-02-12 NOTE — Assessment & Plan Note (Addendum)
Maintained on metformin, humalog. Reports medication compliance. Reports poor diet. Fasting blood sugar readings in 200s every morning recently Encouraged healthy diet. A1c, BMET today. Will maintain medications at current dosages for now pending A1c.  Proteinuria noted on past urine microalbumin. Poor control of diabetes by patient. Will initiate lisinopril 2.5mg  daily to protect kidneys.  Diabetic foot exam done today. Decreased sensation to right foot and thickened brittle toenails. Recommend referral to podiatry but she declines. States she is not interested in pursuing multiple treatments or therapies for her chronic conditions. She reports regular visual inspection of her feet at home  Return in 3 months for follow up

## 2017-02-12 NOTE — Assessment & Plan Note (Addendum)
Chronic. Likely due to combination of diabetes and arterial insufficiency. She has been increasing her Neurontin and ibuprofen dosages at home on her own. We discussed avoiding NSAIDs due to decreased renal function. Will increase dosage of daily gabapentin. Recommended addition of vitamin b12 daily.

## 2017-02-24 ENCOUNTER — Other Ambulatory Visit: Payer: Self-pay | Admitting: Nurse Practitioner

## 2017-03-05 ENCOUNTER — Other Ambulatory Visit: Payer: Self-pay | Admitting: Family

## 2017-03-05 ENCOUNTER — Other Ambulatory Visit: Payer: Self-pay | Admitting: Nurse Practitioner

## 2017-03-05 DIAGNOSIS — G59 Mononeuropathy in diseases classified elsewhere: Secondary | ICD-10-CM

## 2017-03-10 ENCOUNTER — Telehealth: Payer: Self-pay | Admitting: Family

## 2017-03-10 ENCOUNTER — Other Ambulatory Visit: Payer: Self-pay | Admitting: Family

## 2017-03-10 DIAGNOSIS — G59 Mononeuropathy in diseases classified elsewhere: Secondary | ICD-10-CM

## 2017-03-10 MED ORDER — INSULIN LISPRO PROT & LISPRO (75-25 MIX) 100 UNIT/ML KWIKPEN: PEN_INJECTOR | SUBCUTANEOUS | 4 refills | Status: DC

## 2017-03-10 MED ORDER — GABAPENTIN 100 MG PO CAPS: 300.0000 mg | ORAL_CAPSULE | Freq: Two times a day (BID) | ORAL | 0 refills | Status: DC

## 2017-03-10 NOTE — Telephone Encounter (Signed)
Copied from CRM [id number]. Topic: Inquiry >> Mar 10, 2017  2:12 PM Anice PaganiniMunoz, [place] I, NT wrote: Reason for CRM:Pt call for RX Refill for Humalog insuline 75.25 and Gabapentin100 Mg

## 2017-03-10 NOTE — Telephone Encounter (Unsigned)
Copied from CRM #[id number]. Topic: Inquiry >> Mar 10, 2017  2:12 PM Dunya Meiners I, NT wrote: Reason for CRM:Pt call for RX Refill for Humalog insuline 75.25 and Gabapentin100 Mg    

## 2017-03-10 NOTE — Telephone Encounter (Signed)
Medications have been sent. Pt is aware.

## 2017-04-03 ENCOUNTER — Other Ambulatory Visit: Payer: Self-pay | Admitting: Nurse Practitioner

## 2017-04-03 DIAGNOSIS — G59 Mononeuropathy in diseases classified elsewhere: Secondary | ICD-10-CM

## 2017-04-09 ENCOUNTER — Encounter: Payer: Self-pay | Admitting: Family

## 2017-04-09 ENCOUNTER — Ambulatory Visit (HOSPITAL_COMMUNITY)
Admission: RE | Admit: 2017-04-09 | Discharge: 2017-04-09 | Disposition: A | Discharge status: Home / Self Care | Payer: 59 | Source: Ambulatory Visit | Attending: Vascular Surgery | Admitting: Vascular Surgery

## 2017-04-09 ENCOUNTER — Ambulatory Visit: Payer: 59 | Admitting: Family

## 2017-04-09 ENCOUNTER — Ambulatory Visit (INDEPENDENT_AMBULATORY_CARE_PROVIDER_SITE_OTHER)
Admission: RE | Admit: 2017-04-09 | Discharge: 2017-04-09 | Disposition: A | Discharge status: Home / Self Care | Payer: 59 | Source: Ambulatory Visit | Attending: Vascular Surgery | Admitting: Vascular Surgery

## 2017-04-09 VITALS — BP 150/81 | HR 82 | Temp 97.1°F | Resp 18 | Ht 64.0 in | Wt 219.0 lb

## 2017-04-09 DIAGNOSIS — E785 Hyperlipidemia, unspecified: Secondary | ICD-10-CM | POA: Insufficient documentation

## 2017-04-09 DIAGNOSIS — L97511 Non-pressure chronic ulcer of other part of right foot limited to breakdown of skin: Secondary | ICD-10-CM

## 2017-04-09 DIAGNOSIS — Z95828 Presence of other vascular implants and grafts: Secondary | ICD-10-CM

## 2017-04-09 DIAGNOSIS — E119 Type 2 diabetes mellitus without complications: Secondary | ICD-10-CM | POA: Insufficient documentation

## 2017-04-09 DIAGNOSIS — I779 Disorder of arteries and arterioles, unspecified: Secondary | ICD-10-CM

## 2017-04-09 DIAGNOSIS — I10 Essential (primary) hypertension: Secondary | ICD-10-CM | POA: Insufficient documentation

## 2017-04-09 DIAGNOSIS — Z89411 Acquired absence of right great toe: Secondary | ICD-10-CM

## 2017-04-09 DIAGNOSIS — R0989 Other specified symptoms and signs involving the circulatory and respiratory systems: Secondary | ICD-10-CM | POA: Diagnosis present

## 2017-04-09 DIAGNOSIS — R9389 Abnormal findings on diagnostic imaging of other specified body structures: Secondary | ICD-10-CM | POA: Diagnosis not present

## 2017-04-09 DIAGNOSIS — F17200 Nicotine dependence, unspecified, uncomplicated: Secondary | ICD-10-CM | POA: Insufficient documentation

## 2017-04-09 NOTE — Patient Instructions (Signed)
Steps to Quit Smoking Smoking tobacco can be bad for your health. It can also affect almost every organ in your body. Smoking puts you and people around you at risk for many serious long-lasting (chronic) diseases. Quitting smoking is hard, but it is one of the best things that you can do for your health. It is never too late to quit. What are the benefits of quitting smoking? When you quit smoking, you lower your risk for getting serious diseases and conditions. They can include:  Lung cancer or lung disease.  Heart disease.  Stroke.  Heart attack.  Not being able to have children (infertility).  Weak bones (osteoporosis) and broken bones (fractures).  If you have coughing, wheezing, and shortness of breath, those symptoms may get better when you quit. You may also get sick less often. If you are pregnant, quitting smoking can help to lower your chances of having a baby of low birth weight. What can I do to help me quit smoking? Talk with your doctor about what can help you quit smoking. Some things you can do (strategies) include:  Quitting smoking totally, instead of slowly cutting back how much you smoke over a period of time.  Going to in-person counseling. You are more likely to quit if you go to many counseling sessions.  Using resources and support systems, such as: ? Online chats with a counselor. ? Phone quitlines. ? Printed self-help materials. ? Support groups or group counseling. ? Text messaging programs. ? Mobile phone apps or applications.  Taking medicines. Some of these medicines may have nicotine in them. If you are pregnant or breastfeeding, do not take any medicines to quit smoking unless your doctor says it is okay. Talk with your doctor about counseling or other things that can help you.  Talk with your doctor about using more than one strategy at the same time, such as taking medicines while you are also going to in-person counseling. This can help make  quitting easier. What things can I do to make it easier to quit? Quitting smoking might feel very hard at first, but there is a lot that you can do to make it easier. Take these steps:  Talk to your family and friends. Ask them to support and encourage you.  Call phone quitlines, reach out to support groups, or work with a counselor.  Ask people who smoke to not smoke around you.  Avoid places that make you want (trigger) to smoke, such as: ? Bars. ? Parties. ? Smoke-break areas at work.  Spend time with people who do not smoke.  Lower the stress in your life. Stress can make you want to smoke. Try these things to help your stress: ? Getting regular exercise. ? Deep-breathing exercises. ? Yoga. ? Meditating. ? Doing a body scan. To do this, close your eyes, focus on one area of your body at a time from head to toe, and notice which parts of your body are tense. Try to relax the muscles in those areas.  Download or buy apps on your mobile phone or tablet that can help you stick to your quit plan. There are many free apps, such as QuitGuide from the CDC (Centers for Disease Control and Prevention). You can find more support from smokefree.gov and other websites.  This information is not intended to replace advice given to you by your health care provider. Make sure you discuss any questions you have with your health care provider. Document Released: 01/18/2009 Document   Revised: 11/20/2015 Document Reviewed: 08/08/2014 Elsevier Interactive Patient Education  2018 Elsevier Inc.     Peripheral Vascular Disease Peripheral vascular disease (PVD) is a disease of the blood vessels that are not part of your heart and brain. A simple term for PVD is poor circulation. In most cases, PVD narrows the blood vessels that carry blood from your heart to the rest of your body. This can result in a decreased supply of blood to your arms, legs, and internal organs, like your stomach or kidneys.  However, it most often affects a person's lower legs and feet. There are two types of PVD.  Organic PVD. This is the more common type. It is caused by damage to the structure of blood vessels.  Functional PVD. This is caused by conditions that make blood vessels contract and tighten (spasm).  Without treatment, PVD tends to get worse over time. PVD can also lead to acute ischemic limb. This is when an arm or limb suddenly has trouble getting enough blood. This is a medical emergency. Follow these instructions at home:  Take medicines only as told by your doctor.  Do not use any tobacco products, including cigarettes, chewing tobacco, or electronic cigarettes. If you need help quitting, ask your doctor.  Lose weight if you are overweight, and maintain a healthy weight as told by your doctor.  Eat a diet that is low in fat and cholesterol. If you need help, ask your doctor.  Exercise regularly. Ask your doctor for some good activities for you.  Take good care of your feet. ? Wear comfortable shoes that fit well. ? Check your feet often for any cuts or sores. Contact a doctor if:  You have cramps in your legs while walking.  You have leg pain when you are at rest.  You have coldness in a leg or foot.  Your skin changes.  You are unable to get or have an erection (erectile dysfunction).  You have cuts or sores on your feet that are not healing. Get help right away if:  Your arm or leg turns cold and blue.  Your arms or legs become red, warm, swollen, painful, or numb.  You have chest pain or trouble breathing.  You suddenly have weakness in your face, arm, or leg.  You become very confused or you cannot speak.  You suddenly have a very bad headache.  You suddenly cannot see. This information is not intended to replace advice given to you by your health care provider. Make sure you discuss any questions you have with your health care provider. Document Released:  06/18/2009 Document Revised: 08/30/2015 Document Reviewed: 09/01/2013 Elsevier Interactive Patient Education  2017 Elsevier Inc.  
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
## 2017-04-13 NOTE — Telephone Encounter (Signed)
Patient checking status, please advise. Needs 180 tabs. Patient is completely out. Please call once sent [phone]

## 2017-05-04 ENCOUNTER — Other Ambulatory Visit: Payer: Self-pay | Admitting: Nurse Practitioner

## 2017-05-04 DIAGNOSIS — I10 Essential (primary) hypertension: Secondary | ICD-10-CM

## 2017-05-04 DIAGNOSIS — R809 Proteinuria, unspecified: Secondary | ICD-10-CM

## 2017-05-04 DIAGNOSIS — G59 Mononeuropathy in diseases classified elsewhere: Secondary | ICD-10-CM

## 2017-05-18 ENCOUNTER — Telehealth: Payer: Self-pay | Admitting: Nurse Practitioner

## 2017-05-18 MED ORDER — ATORVASTATIN CALCIUM 10 MG PO TABS: 10.0000 mg | ORAL_TABLET | Freq: Every day | ORAL | 0 refills | Status: DC

## 2017-05-18 NOTE — Telephone Encounter (Signed)
Pls schedule next available appt w/Ashleigh before refills can be given.Marland Kitchen.[name]/lmb

## 2017-05-18 NOTE — Telephone Encounter (Signed)
Copied from CRM [phone]. Topic: Quick Communication - See Telephone Encounter >> May 18, 2017 10:40 AM Joana ReamerWarren, Amber N wrote: CRM for notification. See Telephone encounter for:  Patient called because she stated that pharmacy told her that they were waiting on office to sign off on refill for  atorvastatin (LIPITOR) 10 MG tablet . Patient made aware that she needs an appt to get a refill on her medication, but no available appts with provider. [facility] [address] Please advise.  05/18/17.

## 2017-05-18 NOTE — Telephone Encounter (Signed)
Per office policy sent 30 day to local pharmacy until appt.../lmb  

## 2017-05-18 NOTE — Telephone Encounter (Signed)
Patient has an appointment for 3/12. Can she have refills until then?

## 2017-06-16 ENCOUNTER — Ambulatory Visit: Payer: 59 | Admitting: Nurse Practitioner

## 2017-06-16 ENCOUNTER — Encounter: Payer: Self-pay | Admitting: Nurse Practitioner

## 2017-06-16 ENCOUNTER — Other Ambulatory Visit (INDEPENDENT_AMBULATORY_CARE_PROVIDER_SITE_OTHER): Payer: 59

## 2017-06-16 VITALS — BP 140/70 | HR 79 | Temp 97.7°F | Resp 16 | Ht 64.0 in | Wt 225.0 lb

## 2017-06-16 DIAGNOSIS — E785 Hyperlipidemia, unspecified: Secondary | ICD-10-CM

## 2017-06-16 DIAGNOSIS — Z794 Long term (current) use of insulin: Secondary | ICD-10-CM

## 2017-06-16 DIAGNOSIS — E1169 Type 2 diabetes mellitus with other specified complication: Secondary | ICD-10-CM | POA: Insufficient documentation

## 2017-06-16 DIAGNOSIS — E1159 Type 2 diabetes mellitus with other circulatory complications: Secondary | ICD-10-CM | POA: Diagnosis not present

## 2017-06-16 DIAGNOSIS — G59 Mononeuropathy in diseases classified elsewhere: Secondary | ICD-10-CM

## 2017-06-16 DIAGNOSIS — I739 Peripheral vascular disease, unspecified: Secondary | ICD-10-CM

## 2017-06-16 LAB — BASIC METABOLIC PANEL
BUN: 23 mg/dL (ref 6–23)
CO2: 31 mEq/L (ref 19–32)
Calcium: 9.8 mg/dL (ref 8.4–10.5)
Chloride: 101 mEq/L (ref 96–112)
Creatinine, Ser: 1.08 mg/dL (ref 0.40–1.20)
GFR: 54.87 mL/min — ABNORMAL LOW (ref >60.00)
Glucose, Bld: 308 mg/dL — ABNORMAL HIGH (ref 70–99)
Potassium: 4.5 mEq/L (ref 3.5–5.1)
Sodium: 139 mEq/L (ref 135–145)

## 2017-06-16 LAB — LIPID PANEL
Cholesterol: 164 mg/dL (ref 0–200)
HDL: 40.6 mg/dL (ref >39.00)
NonHDL: 123.63
Total CHOL/HDL Ratio: 4
Triglycerides: 244 mg/dL — ABNORMAL HIGH (ref 0.0–149.0)
VLDL: 48.8 mg/dL — ABNORMAL HIGH (ref 0.0–40.0)

## 2017-06-16 LAB — LDL CHOLESTEROL, DIRECT: Direct LDL: 90 mg/dL

## 2017-06-16 LAB — HEMOGLOBIN A1C: Hgb A1c MFr Bld: 10.4 % — ABNORMAL HIGH (ref 4.6–6.5)

## 2017-06-16 MED ORDER — GABAPENTIN 100 MG PO CAPS: 300.0000 mg | ORAL_CAPSULE | Freq: Two times a day (BID) | ORAL | 0 refills | Status: DC

## 2017-06-16 MED ORDER — ATORVASTATIN CALCIUM 10 MG PO TABS: 10.0000 mg | ORAL_TABLET | Freq: Every day | ORAL | 0 refills | Status: DC

## 2017-06-16 NOTE — Assessment & Plan Note (Signed)
Stable, continue current dosage of gabapentin Follow up for worsening symptoms or side effects - gabapentin (NEURONTIN) 100 MG capsule; Take 3 capsules (300 mg total) by mouth 2 (two) times daily. Keep scheduled appt for future refills  Dispense: 180 capsule; Refill: 0
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
## 2017-06-16 NOTE — Assessment & Plan Note (Signed)
She is following with vein and vascular specialists for routine care including annual LE arterial duplex and bialteral ABI studies. Continue atorvastatin, aspirin Continue F/u with vein and vascular specialist

## 2017-06-16 NOTE — Assessment & Plan Note (Signed)
Continue metformin, humalog for blood glucose reduction and lisinopril for kidney protection. Will follow up with patient regarding medication adjustments based on A1c today Discussed the role of healthy diet and exercise in the management of diabetes-See AVS for education provided to patient - Basic metabolic panel; Future - Hemoglobin A1c; Future

## 2017-06-16 NOTE — Assessment & Plan Note (Signed)
Continue atorvastatin, aspirin No lipid panel on file, will order today- she is fasting Discussed the role of healthy diet and exercise in the management of cholesterol-See AVS for education provided to patient - atorvastatin (LIPITOR) 10 MG tablet; Take 1 tablet (10 mg total) by mouth daily. Must keep appt w/new provider for refills  Dispense: 30 tablet; Refill: 0 - Lipid panel; Future

## 2017-06-16 NOTE — Patient Instructions (Addendum)
Please head downstairs for lab work.  I will follow up with you on medication changes once I get your lab results.  Please return in 3 months for routine follow up of your diabetes and blood pressure.  It was good to see you. Thanks for letting me take care of you today :)    Mediterranean Diet A Mediterranean diet refers to food and lifestyle choices that are based on the traditions of countries located on the [place]. This way of eating has been shown to help prevent certain conditions and improve outcomes for people who have chronic diseases, like kidney disease and heart disease. What are tips for following this plan? Lifestyle  Cook and eat meals together with your family, when possible.  Drink enough fluid to keep your urine clear or pale yellow.  Be physically active every day. This includes: ? Aerobic exercise like running or swimming. ? Leisure activities like gardening, walking, or housework.  Get 7-8 hours of sleep each night.  If recommended by your health care provider, drink red wine in moderation. This means 1 glass a day for nonpregnant women and 2 glasses a day for men. A glass of wine equals 5 oz (150 mL). Reading food labels  Check the serving size of packaged foods. For foods such as rice and pasta, the serving size refers to the amount of cooked product, not dry.  Check the total fat in packaged foods. Avoid foods that have saturated fat or trans fats.  Check the ingredients list for added sugars, such as corn syrup. Shopping  At the grocery store, buy most of your food from the areas near the walls of the store. This includes: ? Fresh fruits and vegetables (produce). ? Grains, beans, nuts, and seeds. Some of these may be available in unpackaged forms or large amounts (in bulk). ? Fresh seafood. ? Poultry and eggs. ? Low-fat dairy products.  Buy whole ingredients instead of prepackaged foods.  Buy fresh fruits and vegetables in-season from  local farmers markets.  Buy frozen fruits and vegetables in resealable bags.  If you do not have access to quality fresh seafood, buy precooked frozen shrimp or canned fish, such as tuna, salmon, or sardines.  Buy small amounts of raw or cooked vegetables, salads, or olives from the deli or salad bar at your store.  Stock your pantry so you always have certain foods on hand, such as olive oil, canned tuna, canned tomatoes, rice, pasta, and beans. Cooking  Cook foods with extra-virgin olive oil instead of using butter or other vegetable oils.  Have meat as a side dish, and have vegetables or grains as your main dish. This means having meat in small portions or adding small amounts of meat to foods like pasta or stew.  Use beans or vegetables instead of meat in common dishes like chili or lasagna.  Experiment with different cooking methods. Try roasting or broiling vegetables instead of steaming or sauteing them.  Add frozen vegetables to soups, stews, pasta, or rice.  Add nuts or seeds for added healthy fat at each meal. You can add these to yogurt, salads, or vegetable dishes.  Marinate fish or vegetables using olive oil, lemon juice, garlic, and fresh herbs. Meal planning  Plan to eat 1 vegetarian meal one day each week. Try to work up to 2 vegetarian meals, if possible.  Eat seafood 2 or more times a week.  Have healthy snacks readily available, such as: ? Vegetable sticks with hummus. ?  Greek yogurt. ? Fruit and nut trail mix.  Eat balanced meals throughout the week. This includes: ? Fruit: 2-3 servings a day ? Vegetables: 4-5 servings a day ? Low-fat dairy: 2 servings a day ? Fish, poultry, or lean meat: 1 serving a day ? Beans and legumes: 2 or more servings a week ? Nuts and seeds: 1-2 servings a day ? Whole grains: 6-8 servings a day ? Extra-virgin olive oil: 3-4 servings a day  Limit red meat and sweets to only a few servings a month What are my food  choices?  Mediterranean diet ? Recommended ? Grains: Whole-grain pasta. Brown rice. Bulgar wheat. Polenta. Couscous. Whole-wheat bread. Orpah Cobb. ? Vegetables: Artichokes. Beets. Broccoli. Cabbage. Carrots. Eggplant. Green beans. Chard. Kale. Spinach. Onions. Leeks. Peas. Squash. Tomatoes. Peppers. Radishes. ? Fruits: Apples. Apricots. Avocado. Berries. Bananas. Cherries. Dates. Figs. Grapes. Lemons. Melon. Oranges. Peaches. Plums. Pomegranate. ? Meats and other protein foods: Beans. Almonds. Sunflower seeds. Pine nuts. Peanuts. Cod. Salmon. Scallops. Shrimp. Tuna. Tilapia. Clams. Oysters. Eggs. ? Dairy: Low-fat milk. Cheese. Greek yogurt. ? Beverages: Water. Red wine. Herbal tea. ? Fats and oils: Extra virgin olive oil. Avocado oil. Grape seed oil. ? Sweets and desserts: [place] yogurt with honey. Baked apples. Poached pears. Trail mix. ? Seasoning and other foods: Basil. Cilantro. Coriander. Cumin. Mint. Parsley. Sage. Rosemary. Tarragon. Garlic. Oregano. Thyme. Pepper. Balsalmic vinegar. Tahini. Hummus. Tomato sauce. Olives. Mushrooms. ? Limit these ? Grains: Prepackaged pasta or rice dishes. Prepackaged cereal with added sugar. ? Vegetables: Deep fried potatoes (french fries). ? Fruits: Fruit canned in syrup. ? Meats and other protein foods: Beef. Pork. Lamb. Poultry with skin. Hot dogs. Tomasa Blase. ? Dairy: Ice cream. Sour cream. Whole milk. ? Beverages: Juice. Sugar-sweetened soft drinks. Beer. Liquor and spirits. ? Fats and oils: Butter. Canola oil. Vegetable oil. Beef fat (tallow). Lard. ? Sweets and desserts: Cookies. Cakes. Pies. Candy. ? Seasoning and other foods: Mayonnaise. Premade sauces and marinades. ? The items listed may not be a complete list. Talk with your dietitian about what dietary choices are right for you. Summary  The Mediterranean diet includes both food and lifestyle choices.  Eat a variety of fresh fruits and vegetables, beans, nuts, seeds, and whole  grains.  Limit the amount of red meat and sweets that you eat.  Talk with your health care provider about whether it is safe for you to drink red wine in moderation. This means 1 glass a day for nonpregnant women and 2 glasses a day for men. A glass of wine equals 5 oz (150 mL). This information is not intended to replace advice given to you by your health care provider. Make sure you discuss any questions you have with your health care provider. Document Released: 11/15/2015 Document Revised: 12/18/2015 Document Reviewed: 11/15/2015 Elsevier Interactive Patient Education  [employer].

## 2017-06-22 ENCOUNTER — Other Ambulatory Visit: Payer: Self-pay | Admitting: Nurse Practitioner

## 2017-06-22 DIAGNOSIS — E785 Hyperlipidemia, unspecified: Secondary | ICD-10-CM

## 2017-06-22 MED ORDER — ATORVASTATIN CALCIUM 20 MG PO TABS: 20.0000 mg | ORAL_TABLET | Freq: Every day | ORAL | 1 refills | Status: DC

## 2017-08-03 ENCOUNTER — Other Ambulatory Visit: Payer: Self-pay | Admitting: Nurse Practitioner

## 2017-08-03 DIAGNOSIS — G59 Mononeuropathy in diseases classified elsewhere: Secondary | ICD-10-CM

## 2017-08-03 DIAGNOSIS — I10 Essential (primary) hypertension: Secondary | ICD-10-CM

## 2017-09-02 ENCOUNTER — Encounter: Payer: Self-pay | Admitting: Nurse Practitioner

## 2017-09-03 ENCOUNTER — Other Ambulatory Visit (INDEPENDENT_AMBULATORY_CARE_PROVIDER_SITE_OTHER): Payer: 59

## 2017-09-03 ENCOUNTER — Other Ambulatory Visit: Payer: Self-pay

## 2017-09-03 ENCOUNTER — Other Ambulatory Visit: Payer: Self-pay | Admitting: Nurse Practitioner

## 2017-09-03 DIAGNOSIS — E785 Hyperlipidemia, unspecified: Secondary | ICD-10-CM

## 2017-09-03 LAB — COMPREHENSIVE METABOLIC PANEL
ALT: 18 U/L (ref 0–35)
AST: 14 U/L (ref 0–37)
Albumin: 3.8 g/dL (ref 3.5–5.2)
Alkaline Phosphatase: 98 U/L (ref 39–117)
BUN: 19 mg/dL (ref 6–23)
CO2: 27 mEq/L (ref 19–32)
Calcium: 9.3 mg/dL (ref 8.4–10.5)
Chloride: 108 mEq/L (ref 96–112)
Creatinine, Ser: 1.12 mg/dL (ref 0.40–1.20)
GFR: 52.57 mL/min — ABNORMAL LOW (ref >60.00)
Glucose, Bld: 109 mg/dL — ABNORMAL HIGH (ref 70–99)
Potassium: 4 mEq/L (ref 3.5–5.1)
Sodium: 144 mEq/L (ref 135–145)
Total Bilirubin: 0.6 mg/dL (ref 0.2–1.2)
Total Protein: 6.9 g/dL (ref 6.0–8.3)

## 2017-09-03 LAB — LIPID PANEL
Cholesterol: 175 mg/dL (ref 0–200)
HDL: 36.3 mg/dL — ABNORMAL LOW (ref >39.00)
NonHDL: 138.87
Total CHOL/HDL Ratio: 5
Triglycerides: 266 mg/dL — ABNORMAL HIGH (ref 0.0–149.0)
VLDL: 53.2 mg/dL — ABNORMAL HIGH (ref 0.0–40.0)

## 2017-09-03 LAB — LDL CHOLESTEROL, DIRECT: Direct LDL: 106 mg/dL

## 2017-09-03 MED ORDER — ATORVASTATIN CALCIUM 40 MG PO TABS: 40.0000 mg | ORAL_TABLET | Freq: Every day | ORAL | 1 refills | Status: DC

## 2017-10-01 ENCOUNTER — Other Ambulatory Visit: Payer: Self-pay | Admitting: Nurse Practitioner

## 2017-10-01 DIAGNOSIS — G59 Mononeuropathy in diseases classified elsewhere: Secondary | ICD-10-CM

## 2017-10-01 DIAGNOSIS — I10 Essential (primary) hypertension: Secondary | ICD-10-CM

## 2017-10-22 ENCOUNTER — Ambulatory Visit: Payer: 59 | Admitting: Nurse Practitioner

## 2017-11-03 ENCOUNTER — Other Ambulatory Visit: Payer: Self-pay | Admitting: Nurse Practitioner

## 2017-11-03 DIAGNOSIS — I10 Essential (primary) hypertension: Secondary | ICD-10-CM

## 2017-11-03 DIAGNOSIS — E785 Hyperlipidemia, unspecified: Secondary | ICD-10-CM

## 2017-11-19 ENCOUNTER — Other Ambulatory Visit (INDEPENDENT_AMBULATORY_CARE_PROVIDER_SITE_OTHER): Payer: 59

## 2017-11-19 ENCOUNTER — Encounter: Payer: Self-pay | Admitting: Nurse Practitioner

## 2017-11-19 ENCOUNTER — Ambulatory Visit: Payer: 59 | Admitting: Nurse Practitioner

## 2017-11-19 VITALS — BP 146/80 | HR 84 | Temp 97.8°F | Resp 16 | Ht 64.0 in | Wt 222.0 lb

## 2017-11-19 DIAGNOSIS — E785 Hyperlipidemia, unspecified: Secondary | ICD-10-CM | POA: Diagnosis not present

## 2017-11-19 DIAGNOSIS — E1159 Type 2 diabetes mellitus with other circulatory complications: Secondary | ICD-10-CM | POA: Diagnosis not present

## 2017-11-19 DIAGNOSIS — Z794 Long term (current) use of insulin: Secondary | ICD-10-CM

## 2017-11-19 DIAGNOSIS — I10 Essential (primary) hypertension: Secondary | ICD-10-CM

## 2017-11-19 LAB — CBC
HCT: 37.6 % (ref 36.0–46.0)
Hemoglobin: 12.6 g/dL (ref 12.0–15.0)
MCHC: 33.5 g/dL (ref 30.0–36.0)
MCV: 90.3 fl (ref 78.0–100.0)
Platelets: 371 10*3/uL (ref 150.0–400.0)
RBC: 4.16 Mil/uL (ref 3.87–5.11)
RDW: 14 % (ref 11.5–15.5)
WBC: 14.5 10*3/uL — ABNORMAL HIGH (ref 4.0–10.5)

## 2017-11-19 LAB — COMPREHENSIVE METABOLIC PANEL
ALT: 18 U/L (ref 0–35)
AST: 13 U/L (ref 0–37)
Albumin: 3.8 g/dL (ref 3.5–5.2)
Alkaline Phosphatase: 105 U/L (ref 39–117)
BUN: 29 mg/dL — ABNORMAL HIGH (ref 6–23)
CO2: 29 mEq/L (ref 19–32)
Calcium: 10 mg/dL (ref 8.4–10.5)
Chloride: 105 mEq/L (ref 96–112)
Creatinine, Ser: 1.29 mg/dL — ABNORMAL HIGH (ref 0.40–1.20)
GFR: 44.63 mL/min — ABNORMAL LOW (ref >60.00)
Glucose, Bld: 296 mg/dL — ABNORMAL HIGH (ref 70–99)
Potassium: 4.8 mEq/L (ref 3.5–5.1)
Sodium: 141 mEq/L (ref 135–145)
Total Bilirubin: 0.5 mg/dL (ref 0.2–1.2)
Total Protein: 6.8 g/dL (ref 6.0–8.3)

## 2017-11-19 LAB — LIPID PANEL
Cholesterol: 170 mg/dL (ref 0–200)
HDL: 35.8 mg/dL — ABNORMAL LOW (ref >39.00)
NonHDL: 134.52
Total CHOL/HDL Ratio: 5
Triglycerides: 358 mg/dL — ABNORMAL HIGH (ref 0.0–149.0)
VLDL: 71.6 mg/dL — ABNORMAL HIGH (ref 0.0–40.0)

## 2017-11-19 LAB — HEMOGLOBIN A1C: Hgb A1c MFr Bld: 10 % — ABNORMAL HIGH (ref 4.6–6.5)

## 2017-11-19 LAB — LDL CHOLESTEROL, DIRECT: Direct LDL: 100 mg/dL

## 2017-11-19 MED ORDER — LISINOPRIL 10 MG PO TABS: 10.0000 mg | ORAL_TABLET | Freq: Every day | ORAL | 1 refills | Status: DC

## 2017-11-19 NOTE — Assessment & Plan Note (Signed)
Continue current medications Update labs F/U with further recommendations pending lab results Additional education printed on AVS - Comprehensive metabolic panel; Future - Hemoglobin A1c; Future - Lipid panel; Future

## 2017-11-19 NOTE — Assessment & Plan Note (Signed)
Continue atorvastatin at current dosage Update labs-she is fasting F/U with further recommendations pending lab results - CBC; Future - Lipid panel; Future

## 2017-11-19 NOTE — Patient Instructions (Addendum)
Please head downstairs for lab work. If any of your test results are critically abnormal, you will be contacted right away. Otherwise, I will contact you within a week about your test results and follow up recommendations  Please increase your lisinopril to 10 mg daily. Please return in about 2 weeks for follow up, so I can recheck your blood pressure and see how you are doing on the new medicaiton  It was nice to see you. Thanks for letting me take care of you    Diabetes Mellitus and Nutrition When you have diabetes (diabetes mellitus), it is very important to have healthy eating habits because your blood sugar (glucose) levels are greatly affected by what you eat and drink. Eating healthy foods in the appropriate amounts, at about the same times every day, can help you:  Control your blood glucose.  Lower your risk of heart disease.  Improve your blood pressure.  Reach or maintain a healthy weight.  Every person with diabetes is different, and each person has different needs for a meal plan. Your health care provider may recommend that you work with a diet and nutrition specialist (dietitian) to make a meal plan that is best for you. Your meal plan may vary depending on factors such as:  The calories you need.  The medicines you take.  Your weight.  Your blood glucose, blood pressure, and cholesterol levels.  Your activity level.  Other health conditions you have, such as heart or kidney disease.  How do carbohydrates affect me? Carbohydrates affect your blood glucose level more than any other type of food. Eating carbohydrates naturally increases the amount of glucose in your blood. Carbohydrate counting is a method for keeping track of how many carbohydrates you eat. Counting carbohydrates is important to keep your blood glucose at a healthy level, especially if you use insulin or take certain oral diabetes medicines. It is important to know how many carbohydrates you can  safely have in each meal. This is different for every person. Your dietitian can help you calculate how many carbohydrates you should have at each meal and for snack. Foods that contain carbohydrates include:  Bread, cereal, rice, pasta, and crackers.  Potatoes and corn.  Peas, beans, and lentils.  Milk and yogurt.  Fruit and juice.  Desserts, such as cakes, cookies, ice cream, and candy.  How does alcohol affect me? Alcohol can cause a sudden decrease in blood glucose (hypoglycemia), especially if you use insulin or take certain oral diabetes medicines. Hypoglycemia can be a life-threatening condition. Symptoms of hypoglycemia (sleepiness, dizziness, and confusion) are similar to symptoms of having too much alcohol. If your health care provider says that alcohol is safe for you, follow these guidelines:  Limit alcohol intake to no more than 1 drink per day for nonpregnant women and 2 drinks per day for men. One drink equals 12 oz of beer, 5 oz of wine, or 1 oz of hard liquor.  Do not drink on an empty stomach.  Keep yourself hydrated with water, diet soda, or unsweetened iced tea.  Keep in mind that regular soda, juice, and other mixers may contain a lot of sugar and must be counted as carbohydrates.  What are tips for following this plan? Reading food labels  Start by checking the serving size on the label. The amount of calories, carbohydrates, fats, and other nutrients listed on the label are based on one serving of the food. Many foods contain more than one serving per package.  Check the total grams (g) of carbohydrates in one serving. You can calculate the number of servings of carbohydrates in one serving by dividing the total carbohydrates by 15. For example, if a food has 30 g of total carbohydrates, it would be equal to 2 servings of carbohydrates.  Check the number of grams (g) of saturated and trans fats in one serving. Choose foods that have low or no amount of these  fats.  Check the number of milligrams (mg) of sodium in one serving. Most people should limit total sodium intake to less than 2,300 mg per day.  Always check the nutrition information of foods labeled as "low-fat" or "nonfat". These foods may be higher in added sugar or refined carbohydrates and should be avoided.  Talk to your dietitian to identify your daily goals for nutrients listed on the label. Shopping  Avoid buying canned, premade, or processed foods. These foods tend to be high in fat, sodium, and added sugar.  Shop around the outside edge of the grocery store. This includes fresh fruits and vegetables, bulk grains, fresh meats, and fresh dairy. Cooking  Use low-heat cooking methods, such as baking, instead of high-heat cooking methods like deep frying.  Cook using healthy oils, such as olive, canola, or sunflower oil.  Avoid cooking with butter, cream, or high-fat meats. Meal planning  Eat meals and snacks regularly, preferably at the same times every day. Avoid going long periods of time without eating.  Eat foods high in fiber, such as fresh fruits, vegetables, beans, and whole grains. Talk to your dietitian about how many servings of carbohydrates you can eat at each meal.  Eat 4-6 ounces of lean protein each day, such as lean meat, chicken, fish, eggs, or tofu. 1 ounce is equal to 1 ounce of meat, chicken, or fish, 1 egg, or 1/4 cup of tofu.  Eat some foods each day that contain healthy fats, such as avocado, nuts, seeds, and fish. Lifestyle   Check your blood glucose regularly.  Exercise at least 30 minutes 5 or more days each week, or as told by your health care provider.  Take medicines as told by your health care provider.  Do not use any products that contain nicotine or tobacco, such as cigarettes and e-cigarettes. If you need help quitting, ask your health care provider.  Work with a Social worker or diabetes educator to identify strategies to manage stress  and any emotional and social challenges. What are some questions to ask my health care provider?  Do I need to meet with a diabetes educator?  Do I need to meet with a dietitian?  What number can I call if I have questions?  When are the best times to check my blood glucose? Where to find more information:  American Diabetes Association: diabetes.org/food-and-fitness/food  Academy of Nutrition and Dietetics: PokerClues.dk  [employer] of Diabetes and Digestive and Kidney Diseases (NIH): ContactWire.be Summary  A healthy meal plan will help you control your blood glucose and maintain a healthy lifestyle.  Working with a diet and nutrition specialist (dietitian) can help you make a meal plan that is best for you.  Keep in mind that carbohydrates and alcohol have immediate effects on your blood glucose levels. It is important to count carbohydrates and to use alcohol carefully. This information is not intended to replace advice given to you by your health care provider. Make sure you discuss any questions you have with your health care provider. Document Released: 12/19/2004 Document Revised:  04/28/2016 Document Reviewed: 04/28/2016 Elsevier Interactive Patient Education  [employer]2018 Elsevier Inc.
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
## 2017-11-19 NOTE — Assessment & Plan Note (Signed)
BP is elevated above goal today We discussed increasing her lisinopril dosage today and she is agreeable, will continue other medications at current dosages RTC in 2 weeks for F/U- recheck BP and BMET - Comprehensive metabolic panel; Future - CBC; Future - Lipid panel; Future - lisinopril (PRINIVIL,ZESTRIL) 10 MG tablet; Take 1 tablet (10 mg total) by mouth daily.  Dispense: 30 tablet; Refill: 1

## 2017-11-26 ENCOUNTER — Other Ambulatory Visit: Payer: Self-pay | Admitting: Nurse Practitioner

## 2017-11-26 DIAGNOSIS — R899 Unspecified abnormal finding in specimens from other organs, systems and tissues: Secondary | ICD-10-CM

## 2017-12-04 ENCOUNTER — Other Ambulatory Visit: Payer: Self-pay | Admitting: Nurse Practitioner

## 2017-12-04 DIAGNOSIS — G59 Mononeuropathy in diseases classified elsewhere: Secondary | ICD-10-CM

## 2018-01-04 ENCOUNTER — Other Ambulatory Visit: Payer: Self-pay | Admitting: Nurse Practitioner

## 2018-01-04 DIAGNOSIS — E785 Hyperlipidemia, unspecified: Secondary | ICD-10-CM

## 2018-01-21 ENCOUNTER — Encounter: Payer: Self-pay | Admitting: Nurse Practitioner

## 2018-01-21 ENCOUNTER — Other Ambulatory Visit (INDEPENDENT_AMBULATORY_CARE_PROVIDER_SITE_OTHER): Payer: 59

## 2018-01-21 DIAGNOSIS — R899 Unspecified abnormal finding in specimens from other organs, systems and tissues: Secondary | ICD-10-CM

## 2018-01-21 LAB — CBC
HCT: 37 % (ref 36.0–46.0)
Hemoglobin: 12.1 g/dL (ref 12.0–15.0)
MCHC: 32.8 g/dL (ref 30.0–36.0)
MCV: 90.7 fl (ref 78.0–100.0)
Platelets: 344 10*3/uL (ref 150.0–400.0)
RBC: 4.08 Mil/uL (ref 3.87–5.11)
RDW: 14.3 % (ref 11.5–15.5)
WBC: 14.7 10*3/uL — ABNORMAL HIGH (ref 4.0–10.5)

## 2018-01-25 ENCOUNTER — Other Ambulatory Visit: Payer: Self-pay | Admitting: Nurse Practitioner

## 2018-01-25 DIAGNOSIS — D72829 Elevated white blood cell count, unspecified: Secondary | ICD-10-CM

## 2018-02-01 ENCOUNTER — Other Ambulatory Visit: Payer: Self-pay | Admitting: Nurse Practitioner

## 2018-02-01 DIAGNOSIS — G59 Mononeuropathy in diseases classified elsewhere: Secondary | ICD-10-CM

## 2018-02-01 DIAGNOSIS — I10 Essential (primary) hypertension: Secondary | ICD-10-CM

## 2018-02-10 ENCOUNTER — Other Ambulatory Visit (INDEPENDENT_AMBULATORY_CARE_PROVIDER_SITE_OTHER): Payer: 59

## 2018-02-10 ENCOUNTER — Encounter: Payer: Self-pay | Admitting: Nurse Practitioner

## 2018-02-10 ENCOUNTER — Ambulatory Visit: Payer: 59 | Admitting: Nurse Practitioner

## 2018-02-10 VITALS — BP 104/64 | HR 78 | Ht 64.0 in | Wt 224.0 lb

## 2018-02-10 DIAGNOSIS — I10 Essential (primary) hypertension: Secondary | ICD-10-CM

## 2018-02-10 DIAGNOSIS — Z794 Long term (current) use of insulin: Secondary | ICD-10-CM

## 2018-02-10 DIAGNOSIS — E1159 Type 2 diabetes mellitus with other circulatory complications: Secondary | ICD-10-CM | POA: Diagnosis not present

## 2018-02-10 LAB — BASIC METABOLIC PANEL
BUN: 25 mg/dL — ABNORMAL HIGH (ref 6–23)
CO2: 27 mEq/L (ref 19–32)
Calcium: 9.1 mg/dL (ref 8.4–10.5)
Chloride: 103 mEq/L (ref 96–112)
Creatinine, Ser: 1.08 mg/dL (ref 0.40–1.20)
GFR: 54.75 mL/min — ABNORMAL LOW (ref >60.00)
Glucose, Bld: 203 mg/dL — ABNORMAL HIGH (ref 70–99)
Potassium: 4.9 mEq/L (ref 3.5–5.1)
Sodium: 138 mEq/L (ref 135–145)

## 2018-02-10 LAB — HEMOGLOBIN A1C: Hgb A1c MFr Bld: 8.9 % — ABNORMAL HIGH (ref 4.6–6.5)

## 2018-02-10 MED ORDER — INSULIN LISPRO PROT & LISPRO (75-25 MIX) 100 UNIT/ML KWIKPEN: PEN_INJECTOR | SUBCUTANEOUS | 3 refills | Status: DC
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
## 2018-02-10 NOTE — Assessment & Plan Note (Signed)
Stable Continue current medications Continue to monitor - Basic metabolic panel; Future

## 2018-02-10 NOTE — Patient Instructions (Signed)
Head downstairs for lab work today   Diabetes Mellitus and Nutrition When you have diabetes (diabetes mellitus), it is very important to have healthy eating habits because your blood sugar (glucose) levels are greatly affected by what you eat and drink. Eating healthy foods in the appropriate amounts, at about the same times every day, can help you:  Control your blood glucose.  Lower your risk of heart disease.  Improve your blood pressure.  Reach or maintain a healthy weight.  Every person with diabetes is different, and each person has different needs for a meal plan. Your health care provider may recommend that you work with a diet and nutrition specialist (dietitian) to make a meal plan that is best for you. Your meal plan may vary depending on factors such as:  The calories you need.  The medicines you take.  Your weight.  Your blood glucose, blood pressure, and cholesterol levels.  Your activity level.  Other health conditions you have, such as heart or kidney disease.  How do carbohydrates affect me? Carbohydrates affect your blood glucose level more than any other type of food. Eating carbohydrates naturally increases the amount of glucose in your blood. Carbohydrate counting is a method for keeping track of how many carbohydrates you eat. Counting carbohydrates is important to keep your blood glucose at a healthy level, especially if you use insulin or take certain oral diabetes medicines. It is important to know how many carbohydrates you can safely have in each meal. This is different for every person. Your dietitian can help you calculate how many carbohydrates you should have at each meal and for snack. Foods that contain carbohydrates include:  Bread, cereal, rice, pasta, and crackers.  Potatoes and corn.  Peas, beans, and lentils.  Milk and yogurt.  Fruit and juice.  Desserts, such as cakes, cookies, ice cream, and candy.  How does alcohol affect  me? Alcohol can cause a sudden decrease in blood glucose (hypoglycemia), especially if you use insulin or take certain oral diabetes medicines. Hypoglycemia can be a life-threatening condition. Symptoms of hypoglycemia (sleepiness, dizziness, and confusion) are similar to symptoms of having too much alcohol. If your health care provider says that alcohol is safe for you, follow these guidelines:  Limit alcohol intake to no more than 1 drink per day for nonpregnant women and 2 drinks per day for men. One drink equals 12 oz of beer, 5 oz of wine, or 1 oz of hard liquor.  Do not drink on an empty stomach.  Keep yourself hydrated with water, diet soda, or unsweetened iced tea.  Keep in mind that regular soda, juice, and other mixers may contain a lot of sugar and must be counted as carbohydrates.  What are tips for following this plan? Reading food labels  Start by checking the serving size on the label. The amount of calories, carbohydrates, fats, and other nutrients listed on the label are based on one serving of the food. Many foods contain more than one serving per package.  Check the total grams (g) of carbohydrates in one serving. You can calculate the number of servings of carbohydrates in one serving by dividing the total carbohydrates by 15. For example, if a food has 30 g of total carbohydrates, it would be equal to 2 servings of carbohydrates.  Check the number of grams (g) of saturated and trans fats in one serving. Choose foods that have low or no amount of these fats.  Check the number of milligrams (  mg) of sodium in one serving. Most people should limit total sodium intake to less than 2,300 mg per day.  Always check the nutrition information of foods labeled as "low-fat" or "nonfat". These foods may be higher in added sugar or refined carbohydrates and should be avoided.  Talk to your dietitian to identify your daily goals for nutrients listed on the label. Shopping  Avoid  buying canned, premade, or processed foods. These foods tend to be high in fat, sodium, and added sugar.  Shop around the outside edge of the grocery store. This includes fresh fruits and vegetables, bulk grains, fresh meats, and fresh dairy. Cooking  Use low-heat cooking methods, such as baking, instead of high-heat cooking methods like deep frying.  Cook using healthy oils, such as olive, canola, or sunflower oil.  Avoid cooking with butter, cream, or high-fat meats. Meal planning  Eat meals and snacks regularly, preferably at the same times every day. Avoid going long periods of time without eating.  Eat foods high in fiber, such as fresh fruits, vegetables, beans, and whole grains. Talk to your dietitian about how many servings of carbohydrates you can eat at each meal.  Eat 4-6 ounces of lean protein each day, such as lean meat, chicken, fish, eggs, or tofu. 1 ounce is equal to 1 ounce of meat, chicken, or fish, 1 egg, or 1/4 cup of tofu.  Eat some foods each day that contain healthy fats, such as avocado, nuts, seeds, and fish. Lifestyle   Check your blood glucose regularly.  Exercise at least 30 minutes 5 or more days each week, or as told by your health care provider.  Take medicines as told by your health care provider.  Do not use any products that contain nicotine or tobacco, such as cigarettes and e-cigarettes. If you need help quitting, ask your health care provider.  Work with a Veterinary surgeon or diabetes educator to identify strategies to manage stress and any emotional and social challenges. What are some questions to ask my health care provider?  Do I need to meet with a diabetes educator?  Do I need to meet with a dietitian?  What number can I call if I have questions?  When are the best times to check my blood glucose? Where to find more information:  American Diabetes Association: diabetes.org/food-and-fitness/food  Academy of Nutrition and Dietetics:  https://www.vargas.com/  [employer] of Diabetes and Digestive and Kidney Diseases (NIH): FindJewelers.cz Summary  A healthy meal plan will help you control your blood glucose and maintain a healthy lifestyle.  Working with a diet and nutrition specialist (dietitian) can help you make a meal plan that is best for you.  Keep in mind that carbohydrates and alcohol have immediate effects on your blood glucose levels. It is important to count carbohydrates and to use alcohol carefully. This information is not intended to replace advice given to you by your health care provider. Make sure you discuss any questions you have with your health care provider. Document Released: 12/19/2004 Document Revised: 04/28/2016 Document Reviewed: 04/28/2016 Elsevier Interactive Patient Education  [employer].

## 2018-02-10 NOTE — Assessment & Plan Note (Signed)
Glucose readings remain elevated with reported medication compliance Continue current medications for now Update A1c F/U with further recommendations pending lab results Additional education provided on AVS - Hemoglobin A1c; Future

## 2018-02-15 ENCOUNTER — Other Ambulatory Visit: Payer: Self-pay | Admitting: Nurse Practitioner

## 2018-02-15 MED ORDER — INSULIN LISPRO PROT & LISPRO (75-25 MIX) 100 UNIT/ML KWIKPEN: PEN_INJECTOR | SUBCUTANEOUS | 3 refills | Status: DC

## 2018-02-25 ENCOUNTER — Telehealth: Payer: Self-pay | Admitting: *Deleted

## 2018-02-25 DIAGNOSIS — E785 Hyperlipidemia, unspecified: Secondary | ICD-10-CM

## 2018-02-25 MED ORDER — ATORVASTATIN CALCIUM 40 MG PO TABS: 40.0000 mg | ORAL_TABLET | Freq: Every day | ORAL | 1 refills | Status: DC

## 2018-02-25 NOTE — Telephone Encounter (Signed)
lipitor 40 sent, labs ordered

## 2018-02-25 NOTE — Telephone Encounter (Signed)
No changes in lisinopril She was suppsed to increase her atorvastatin to 40 mg earlier this year because her cholesterol is still too high. Is she okay with increasing this dosage? If so I will send new dosage and have her return in about 6 weeks to repeat fasting lab work.

## 2018-02-25 NOTE — Telephone Encounter (Signed)
Pt informed of below. She states she is willing to increase Lipitor to 40 mg qd. She states she will try to come back to lab fasting for lipid recheck in 6 weeks. She has a lot going on with her daughter who has been hospitalized x 3 months.

## 2018-02-25 NOTE — Telephone Encounter (Signed)
Patient wanted to let PCP know her atorvastatin is 20 mg 1 qd and her lisinopril is 10 mg 1 qd. She was thinking that changing the doses was discussed. Please advise.

## 2018-04-05 ENCOUNTER — Other Ambulatory Visit: Payer: Self-pay | Admitting: Nurse Practitioner

## 2018-04-05 DIAGNOSIS — G59 Mononeuropathy in diseases classified elsewhere: Secondary | ICD-10-CM

## 2018-04-05 DIAGNOSIS — I10 Essential (primary) hypertension: Secondary | ICD-10-CM

## 2018-05-03 ENCOUNTER — Other Ambulatory Visit: Payer: Self-pay | Admitting: Nurse Practitioner

## 2018-05-03 DIAGNOSIS — E785 Hyperlipidemia, unspecified: Secondary | ICD-10-CM

## 2018-05-03 DIAGNOSIS — I10 Essential (primary) hypertension: Secondary | ICD-10-CM

## 2018-05-10 ENCOUNTER — Other Ambulatory Visit (INDEPENDENT_AMBULATORY_CARE_PROVIDER_SITE_OTHER): Payer: 59

## 2018-05-10 DIAGNOSIS — E785 Hyperlipidemia, unspecified: Secondary | ICD-10-CM | POA: Diagnosis not present

## 2018-05-10 LAB — COMPREHENSIVE METABOLIC PANEL
ALT: 19 U/L (ref 0–35)
AST: 13 U/L (ref 0–37)
Albumin: 3.6 g/dL (ref 3.5–5.2)
Alkaline Phosphatase: 96 U/L (ref 39–117)
BUN: 27 mg/dL — ABNORMAL HIGH (ref 6–23)
CO2: 28 mEq/L (ref 19–32)
Calcium: 9.5 mg/dL (ref 8.4–10.5)
Chloride: 106 mEq/L (ref 96–112)
Creatinine, Ser: 1.15 mg/dL (ref 0.40–1.20)
GFR: 47.87 mL/min — ABNORMAL LOW (ref >60.00)
Glucose, Bld: 173 mg/dL — ABNORMAL HIGH (ref 70–99)
Potassium: 4.1 mEq/L (ref 3.5–5.1)
Sodium: 144 mEq/L (ref 135–145)
Total Bilirubin: 0.5 mg/dL (ref 0.2–1.2)
Total Protein: 6.2 g/dL (ref 6.0–8.3)

## 2018-05-10 LAB — LIPID PANEL
Cholesterol: 183 mg/dL (ref 0–200)
HDL: 33.2 mg/dL — ABNORMAL LOW (ref >39.00)
NonHDL: 149.9
Total CHOL/HDL Ratio: 6
Triglycerides: 279 mg/dL — ABNORMAL HIGH (ref 0.0–149.0)
VLDL: 55.8 mg/dL — ABNORMAL HIGH (ref 0.0–40.0)

## 2018-05-10 LAB — LDL CHOLESTEROL, DIRECT: Direct LDL: 111 mg/dL

## 2018-05-12 ENCOUNTER — Encounter: Payer: Self-pay | Admitting: Nurse Practitioner

## 2018-05-12 ENCOUNTER — Ambulatory Visit: Payer: 59 | Admitting: Nurse Practitioner

## 2018-05-12 VITALS — BP 128/68 | HR 81 | Ht 64.0 in | Wt 226.0 lb

## 2018-05-12 DIAGNOSIS — E1159 Type 2 diabetes mellitus with other circulatory complications: Secondary | ICD-10-CM

## 2018-05-12 DIAGNOSIS — I10 Essential (primary) hypertension: Secondary | ICD-10-CM | POA: Diagnosis not present

## 2018-05-12 DIAGNOSIS — Z794 Long term (current) use of insulin: Secondary | ICD-10-CM | POA: Diagnosis not present

## 2018-05-12 DIAGNOSIS — E785 Hyperlipidemia, unspecified: Secondary | ICD-10-CM

## 2018-05-12 LAB — POCT GLYCOSYLATED HEMOGLOBIN (HGB A1C): Hemoglobin A1C: 8.3 % — AB (ref 4.0–5.6)

## 2018-05-12 MED ORDER — INSULIN LISPRO PROT & LISPRO (75-25 MIX) 100 UNIT/ML KWIKPEN: PEN_INJECTOR | SUBCUTANEOUS | 0 refills | Status: DC

## 2018-05-12 MED ORDER — EZETIMIBE 10 MG PO TABS: 10.0000 mg | ORAL_TABLET | Freq: Every day | ORAL | 3 refills | Status: DC

## 2018-05-12 MED ORDER — METFORMIN HCL ER 500 MG PO TB24: 1000.0000 mg | ORAL_TABLET | Freq: Two times a day (BID) | ORAL | 3 refills | Status: DC

## 2018-05-12 NOTE — Patient Instructions (Signed)
Start zetia.  Increase metformin to 1000 twice daily.  Return in about 6-8 weeks for fasting labs  Return in about 3 months for diabetes follow up   Mediterranean Diet A Mediterranean diet refers to food and lifestyle choices that are based on the traditions of countries located on the [employer] [place]. This way of eating has been shown to help prevent certain conditions and improve outcomes for people who have chronic diseases, like kidney disease and heart disease. What are tips for following this plan? Lifestyle  Cook and eat meals together with your family, when possible.  Drink enough fluid to keep your urine clear or pale yellow.  Be physically active every day. This includes: ? Aerobic exercise like running or swimming. ? Leisure activities like gardening, walking, or housework.  Get 7-8 hours of sleep each night.  If recommended by your health care provider, drink red wine in moderation. This means 1 glass a day for nonpregnant women and 2 glasses a day for men. A glass of wine equals 5 oz (150 mL). Reading food labels   Check the serving size of packaged foods. For foods such as rice and pasta, the serving size refers to the amount of cooked product, not dry.  Check the total fat in packaged foods. Avoid foods that have saturated fat or trans fats.  Check the ingredients list for added sugars, such as corn syrup. Shopping  At the grocery store, buy most of your food from the areas near the walls of the store. This includes: ? Fresh fruits and vegetables (produce). ? Grains, beans, nuts, and seeds. Some of these may be available in unpackaged forms or large amounts (in bulk). ? Fresh seafood. ? Poultry and eggs. ? Low-fat dairy products.  Buy whole ingredients instead of prepackaged foods.  Buy fresh fruits and vegetables in-season from local farmers markets.  Buy frozen fruits and vegetables in resealable bags.  If you do not have access to quality fresh  seafood, buy precooked frozen shrimp or canned fish, such as tuna, salmon, or sardines.  Buy small amounts of raw or cooked vegetables, salads, or olives from the deli or salad bar at your store.  Stock your pantry so you always have certain foods on hand, such as olive oil, canned tuna, canned tomatoes, rice, pasta, and beans. Cooking  Cook foods with extra-virgin olive oil instead of using butter or other vegetable oils.  Have meat as a side dish, and have vegetables or grains as your main dish. This means having meat in small portions or adding small amounts of meat to foods like pasta or stew.  Use beans or vegetables instead of meat in common dishes like chili or lasagna.  Experiment with different cooking methods. Try roasting or broiling vegetables instead of steaming or sauteing them.  Add frozen vegetables to soups, stews, pasta, or rice.  Add nuts or seeds for added healthy fat at each meal. You can add these to yogurt, salads, or vegetable dishes.  Marinate fish or vegetables using olive oil, lemon juice, garlic, and fresh herbs. Meal planning   Plan to eat 1 vegetarian meal one day each week. Try to work up to 2 vegetarian meals, if possible.  Eat seafood 2 or more times a week.  Have healthy snacks readily available, such as: ? Vegetable sticks with hummus. ? [place] yogurt. ? Fruit and nut trail mix.  Eat balanced meals throughout the week. This includes: ? Fruit: 2-3 servings a day ? Vegetables: 4-5  servings a day ? Low-fat dairy: 2 servings a day ? Fish, poultry, or lean meat: 1 serving a day ? Beans and legumes: 2 or more servings a week ? Nuts and seeds: 1-2 servings a day ? Whole grains: 6-8 servings a day ? Extra-virgin olive oil: 3-4 servings a day  Limit red meat and sweets to only a few servings a month What are my food choices?  Mediterranean diet ? Recommended ? Grains: Whole-grain pasta. Brown rice. Bulgar wheat. Polenta. Couscous. Whole-wheat  bread. Orpah Cobb. ? Vegetables: Artichokes. Beets. Broccoli. Cabbage. Carrots. Eggplant. Green beans. Chard. Kale. Spinach. Onions. Leeks. Peas. Squash. Tomatoes. Peppers. Radishes. ? Fruits: Apples. Apricots. Avocado. Berries. Bananas. Cherries. Dates. Figs. Grapes. Lemons. Melon. Oranges. Peaches. Plums. Pomegranate. ? Meats and other protein foods: Beans. Almonds. Sunflower seeds. Pine nuts. Peanuts. Cod. Salmon. Scallops. Shrimp. Tuna. Tilapia. Clams. Oysters. Eggs. ? Dairy: Low-fat milk. Cheese. Greek yogurt. ? Beverages: Water. Red wine. Herbal tea. ? Fats and oils: Extra virgin olive oil. Avocado oil. Grape seed oil. ? Sweets and desserts: [place] yogurt with honey. Baked apples. Poached pears. Trail mix. ? Seasoning and other foods: Basil. Cilantro. Coriander. Cumin. Mint. Parsley. Sage. Rosemary. Tarragon. Garlic. Oregano. Thyme. Pepper. Balsalmic vinegar. Tahini. Hummus. Tomato sauce. Olives. Mushrooms. ? Limit these ? Grains: Prepackaged pasta or rice dishes. Prepackaged cereal with added sugar. ? Vegetables: Deep fried potatoes (french fries). ? Fruits: Fruit canned in syrup. ? Meats and other protein foods: Beef. Pork. Lamb. Poultry with skin. Hot dogs. Tomasa Blase. ? Dairy: Ice cream. Sour cream. Whole milk. ? Beverages: Juice. Sugar-sweetened soft drinks. Beer. Liquor and spirits. ? Fats and oils: Butter. Canola oil. Vegetable oil. Beef fat (tallow). Lard. ? Sweets and desserts: Cookies. Cakes. Pies. Candy. ? Seasoning and other foods: Mayonnaise. Premade sauces and marinades. ? The items listed may not be a complete list. Talk with your dietitian about what dietary choices are right for you. Summary  The Mediterranean diet includes both food and lifestyle choices.  Eat a variety of fresh fruits and vegetables, beans, nuts, seeds, and whole grains.  Limit the amount of red meat and sweets that you eat.  Talk with your health care provider about whether it is safe for you to  drink red wine in moderation. This means 1 glass a day for nonpregnant women and 2 glasses a day for men. A glass of wine equals 5 oz (150 mL). This information is not intended to replace advice given to you by your health care provider. Make sure you discuss any questions you have with your health care provider. Document Released: 11/15/2015 Document Revised: 12/18/2015 Document Reviewed: 11/15/2015 Elsevier Interactive Patient Education  2019 [employer].

## 2018-05-12 NOTE — Assessment & Plan Note (Signed)
Continue atorvastatin at current dosage and ADD zetia- medication dosing, side effects discussed Return to lab in 6-8 weeks for repeat fasting labs to check response Additional education, healthy diet provided on AVS - ezetimibe (ZETIA) 10 MG tablet; Take 1 tablet (10 mg total) by mouth daily.  Dispense: 30 tablet; Refill: 3 - Comprehensive metabolic panel; Future - Lipid panel; Future

## 2018-05-12 NOTE — Assessment & Plan Note (Addendum)
A1c remains elevated Will increase metformin to 1000 BID, continue humalog at current dosage-medication dosing, side effects discussed Additional education, healthy diet provided on AVS RTC in 3 months for f/u, recheck A1c - POCT HgB A1C - Insulin Lispro Prot & Lispro (HUMALOG MIX 75/25 KWIKPEN) (75-25) 100 UNIT/ML Kwikpen; INJECT 30 UNITS SUBCUTANEOUSLY IN THE MORNING AND 30 UNITS IN THE EVENING  Dispense: 15 mL; Refill: 0 - metFORMIN (GLUCOPHAGE-XR) 500 MG 24 hr tablet; Take 2 tablets (1,000 mg total) by mouth 2 (two) times daily.  Dispense: 360 tablet; Refill: 3
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
## 2018-05-12 NOTE — Assessment & Plan Note (Signed)
Stable  Continue current medications Continue to monitor Additional education, healthy diet provided on AVS

## 2018-06-04 ENCOUNTER — Other Ambulatory Visit: Payer: Self-pay | Admitting: Nurse Practitioner

## 2018-06-04 DIAGNOSIS — I10 Essential (primary) hypertension: Secondary | ICD-10-CM

## 2018-06-04 DIAGNOSIS — G59 Mononeuropathy in diseases classified elsewhere: Secondary | ICD-10-CM

## 2018-07-06 ENCOUNTER — Other Ambulatory Visit: Payer: Self-pay | Admitting: Nurse Practitioner

## 2018-07-06 DIAGNOSIS — Z794 Long term (current) use of insulin: Secondary | ICD-10-CM

## 2018-07-06 DIAGNOSIS — E1159 Type 2 diabetes mellitus with other circulatory complications: Secondary | ICD-10-CM

## 2018-08-04 ENCOUNTER — Other Ambulatory Visit: Payer: Self-pay | Admitting: *Deleted

## 2018-08-04 DIAGNOSIS — E785 Hyperlipidemia, unspecified: Secondary | ICD-10-CM

## 2018-08-04 DIAGNOSIS — I10 Essential (primary) hypertension: Secondary | ICD-10-CM

## 2018-08-04 MED ORDER — AMLODIPINE BESYLATE 10 MG PO TABS: 10.0000 mg | ORAL_TABLET | Freq: Every day | ORAL | 0 refills | Status: DC

## 2018-08-04 MED ORDER — LISINOPRIL 10 MG PO TABS: 10.0000 mg | ORAL_TABLET | Freq: Every day | ORAL | 0 refills | Status: DC

## 2018-08-04 MED ORDER — ATORVASTATIN CALCIUM 40 MG PO TABS: 40.0000 mg | ORAL_TABLET | Freq: Every day | ORAL | 0 refills | Status: DC

## 2018-08-04 MED ORDER — CARVEDILOL 25 MG PO TABS: 25.0000 mg | ORAL_TABLET | Freq: Two times a day (BID) | ORAL | 0 refills | Status: DC

## 2018-09-03 ENCOUNTER — Other Ambulatory Visit: Payer: Self-pay | Admitting: Internal Medicine

## 2018-09-03 DIAGNOSIS — E785 Hyperlipidemia, unspecified: Secondary | ICD-10-CM

## 2018-09-03 DIAGNOSIS — E1159 Type 2 diabetes mellitus with other circulatory complications: Secondary | ICD-10-CM

## 2018-09-03 DIAGNOSIS — Z794 Long term (current) use of insulin: Secondary | ICD-10-CM

## 2018-09-03 MED ORDER — EZETIMIBE 10 MG PO TABS: 10.0000 mg | ORAL_TABLET | Freq: Every day | ORAL | 0 refills | Status: DC

## 2018-09-09 ENCOUNTER — Telehealth: Payer: Self-pay | Admitting: Nurse Practitioner

## 2018-09-09 NOTE — Telephone Encounter (Signed)
-----   Message from Corwin Levins, MD sent at 08/08/2018  6:54 PM EDT ----- Regarding: transfer of care Ms Shirley Chen or other Scheduler,  Pt is due now for CPX and DM f/u   Please contact this patient, and ask them to schedule with me as new PCP asap (even same day if possible) when you are able to address this, in order for the patient to continue have updated ongoing care without being lost in the system, and to keep up a high standard of care at [facility]   These visits can be In Person (if no fever symptoms), or Virtual.  I would prefer in person during day hours at the office, but either is ok.  Please be aware I would like 4 Doxy (virtual) visits if possible each Mon/Tues/Wed/Thur evenings that I can do from home.  The template has been changed to reflect the ability to do this, and I will just need to be aware to do this by watching my schedule.

## 2018-09-09 NOTE — Telephone Encounter (Signed)
LVM for patient to call back to make appt for TOC/CPE.

## 2018-10-04 ENCOUNTER — Other Ambulatory Visit: Payer: Self-pay | Admitting: Internal Medicine

## 2018-10-04 DIAGNOSIS — Z794 Long term (current) use of insulin: Secondary | ICD-10-CM

## 2018-10-04 DIAGNOSIS — E1159 Type 2 diabetes mellitus with other circulatory complications: Secondary | ICD-10-CM

## 2018-10-11 ENCOUNTER — Other Ambulatory Visit: Payer: Self-pay | Admitting: *Deleted

## 2018-10-11 DIAGNOSIS — G59 Mononeuropathy in diseases classified elsewhere: Secondary | ICD-10-CM

## 2018-10-11 MED ORDER — GABAPENTIN 100 MG PO CAPS: 300.0000 mg | ORAL_CAPSULE | Freq: Two times a day (BID) | ORAL | 0 refills | Status: DC

## 2018-10-14 ENCOUNTER — Telehealth: Payer: Self-pay | Admitting: *Deleted

## 2018-10-14 ENCOUNTER — Telehealth: Payer: Self-pay | Admitting: Internal Medicine

## 2018-10-14 MED ORDER — METFORMIN HCL 500 MG PO TABS: 1000.0000 mg | ORAL_TABLET | Freq: Two times a day (BID) | ORAL | 0 refills | Status: DC

## 2018-10-14 NOTE — Telephone Encounter (Signed)
Left message for patient to call back. CRM created 

## 2018-10-14 NOTE — Telephone Encounter (Signed)
Received fax re: Metformin ER recall. Need new rx. Please advise.

## 2018-10-14 NOTE — Telephone Encounter (Signed)
Attempted to reach pt, no answer. Left vm stating pt to call back in the morning to office as they are currently closed

## 2018-10-14 NOTE — Telephone Encounter (Signed)
Thanks for your inquiry about the concern related to a possible cancer causing contaminant to the metformin ER.   We will simply need to change your metformin ER to the regular metformin as there has been no concern about this with the regular metformin.  We will send a new prescription, and simply start this when you are able instead.   Ok for [facility] to me in 1 mo for further refills

## 2018-11-15 ENCOUNTER — Other Ambulatory Visit: Payer: Self-pay

## 2018-11-15 ENCOUNTER — Ambulatory Visit (INDEPENDENT_AMBULATORY_CARE_PROVIDER_SITE_OTHER): Payer: 59 | Admitting: Family

## 2018-11-15 ENCOUNTER — Encounter: Payer: Self-pay | Admitting: Family

## 2018-11-15 VITALS — BP 124/84 | HR 82 | Temp 97.6°F | Ht 64.0 in | Wt 211.0 lb

## 2018-11-15 DIAGNOSIS — Z794 Long term (current) use of insulin: Secondary | ICD-10-CM

## 2018-11-15 DIAGNOSIS — G59 Mononeuropathy in diseases classified elsewhere: Secondary | ICD-10-CM

## 2018-11-15 DIAGNOSIS — E559 Vitamin D deficiency, unspecified: Secondary | ICD-10-CM | POA: Diagnosis not present

## 2018-11-15 DIAGNOSIS — E785 Hyperlipidemia, unspecified: Secondary | ICD-10-CM

## 2018-11-15 DIAGNOSIS — E1159 Type 2 diabetes mellitus with other circulatory complications: Secondary | ICD-10-CM

## 2018-11-15 DIAGNOSIS — I10 Essential (primary) hypertension: Secondary | ICD-10-CM | POA: Diagnosis not present

## 2018-11-15 MED ORDER — METFORMIN HCL 500 MG PO TABS: 500.0000 mg | ORAL_TABLET | Freq: Two times a day (BID) | ORAL | 0 refills | Status: DC

## 2018-11-15 MED ORDER — ATORVASTATIN CALCIUM 40 MG PO TABS: 40.0000 mg | ORAL_TABLET | Freq: Every day | ORAL | 3 refills | Status: DC

## 2018-11-15 MED ORDER — LISINOPRIL 10 MG PO TABS: 10.0000 mg | ORAL_TABLET | Freq: Every day | ORAL | 3 refills | Status: DC

## 2018-11-15 MED ORDER — AMLODIPINE BESYLATE 10 MG PO TABS: 10.0000 mg | ORAL_TABLET | Freq: Every day | ORAL | 3 refills | Status: DC

## 2018-11-15 MED ORDER — CARVEDILOL 25 MG PO TABS: 25.0000 mg | ORAL_TABLET | Freq: Two times a day (BID) | ORAL | 3 refills | Status: DC

## 2018-11-15 MED ORDER — GABAPENTIN 100 MG PO CAPS: 300.0000 mg | ORAL_CAPSULE | Freq: Two times a day (BID) | ORAL | 1 refills | Status: DC

## 2018-11-15 MED ORDER — INSULIN LISPRO PROT & LISPRO (75-25 MIX) 100 UNIT/ML KWIKPEN: PEN_INJECTOR | SUBCUTANEOUS | 3 refills | Status: DC

## 2018-11-15 MED ORDER — EZETIMIBE 10 MG PO TABS: 10.0000 mg | ORAL_TABLET | Freq: Every day | ORAL | 3 refills | Status: DC

## 2018-11-15 MED ORDER — HYDRALAZINE HCL 50 MG PO TABS: ORAL_TABLET | ORAL | 2 refills | Status: DC
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
## 2018-12-07 ENCOUNTER — Other Ambulatory Visit (INDEPENDENT_AMBULATORY_CARE_PROVIDER_SITE_OTHER): Payer: 59

## 2018-12-07 DIAGNOSIS — E1159 Type 2 diabetes mellitus with other circulatory complications: Secondary | ICD-10-CM | POA: Diagnosis not present

## 2018-12-07 DIAGNOSIS — E559 Vitamin D deficiency, unspecified: Secondary | ICD-10-CM | POA: Diagnosis not present

## 2018-12-07 DIAGNOSIS — Z794 Long term (current) use of insulin: Secondary | ICD-10-CM

## 2018-12-07 DIAGNOSIS — E785 Hyperlipidemia, unspecified: Secondary | ICD-10-CM | POA: Diagnosis not present

## 2018-12-07 DIAGNOSIS — I10 Essential (primary) hypertension: Secondary | ICD-10-CM

## 2018-12-07 LAB — CBC WITH DIFFERENTIAL/PLATELET
Basophils Absolute: 0 10*3/uL (ref 0.0–0.1)
Basophils Relative: 0.2 % (ref 0.0–3.0)
Eosinophils Absolute: 0.4 10*3/uL (ref 0.0–0.7)
Eosinophils Relative: 2.7 % (ref 0.0–5.0)
HCT: 35.5 % — ABNORMAL LOW (ref 36.0–46.0)
Hemoglobin: 11.7 g/dL — ABNORMAL LOW (ref 12.0–15.0)
Lymphocytes Relative: 23.6 % (ref 12.0–46.0)
Lymphs Abs: 3.1 10*3/uL (ref 0.7–4.0)
MCHC: 33.1 g/dL (ref 30.0–36.0)
MCV: 88.4 fl (ref 78.0–100.0)
Monocytes Absolute: 0.8 10*3/uL (ref 0.1–1.0)
Monocytes Relative: 6 % (ref 3.0–12.0)
Neutro Abs: 8.8 10*3/uL — ABNORMAL HIGH (ref 1.4–7.7)
Neutrophils Relative %: 67.5 % (ref 43.0–77.0)
Platelets: 362 10*3/uL (ref 150.0–400.0)
RBC: 4.01 Mil/uL (ref 3.87–5.11)
RDW: 14.7 % (ref 11.5–15.5)
WBC: 13.1 10*3/uL — ABNORMAL HIGH (ref 4.0–10.5)

## 2018-12-07 LAB — COMPREHENSIVE METABOLIC PANEL
ALT: 9 U/L (ref 0–35)
AST: 10 U/L (ref 0–37)
Albumin: 3.6 g/dL (ref 3.5–5.2)
Alkaline Phosphatase: 98 U/L (ref 39–117)
BUN: 24 mg/dL — ABNORMAL HIGH (ref 6–23)
CO2: 30 mEq/L (ref 19–32)
Calcium: 9.5 mg/dL (ref 8.4–10.5)
Chloride: 106 mEq/L (ref 96–112)
Creatinine, Ser: 1.13 mg/dL (ref 0.40–1.20)
GFR: 48.76 mL/min — ABNORMAL LOW (ref >60.00)
Glucose, Bld: 84 mg/dL (ref 70–99)
Potassium: 4.2 mEq/L (ref 3.5–5.1)
Sodium: 143 mEq/L (ref 135–145)
Total Bilirubin: 0.6 mg/dL (ref 0.2–1.2)
Total Protein: 6.6 g/dL (ref 6.0–8.3)

## 2018-12-07 LAB — LIPID PANEL
Cholesterol: 110 mg/dL (ref 0–200)
HDL: 35.6 mg/dL — ABNORMAL LOW (ref >39.00)
LDL Cholesterol: 50 mg/dL (ref 0–99)
NonHDL: 74.39
Total CHOL/HDL Ratio: 3
Triglycerides: 122 mg/dL (ref 0.0–149.0)
VLDL: 24.4 mg/dL (ref 0.0–40.0)

## 2018-12-07 LAB — HEMOGLOBIN A1C: Hgb A1c MFr Bld: 8.9 % — ABNORMAL HIGH (ref 4.6–6.5)

## 2018-12-07 LAB — VITAMIN D 25 HYDROXY (VIT D DEFICIENCY, FRACTURES): VITD: 26.12 ng/mL — ABNORMAL LOW (ref 30.00–100.00)

## 2019-02-04 ENCOUNTER — Other Ambulatory Visit: Payer: Self-pay | Admitting: Family

## 2019-02-04 DIAGNOSIS — G59 Mononeuropathy in diseases classified elsewhere: Secondary | ICD-10-CM

## 2019-02-04 NOTE — Telephone Encounter (Signed)
Medication Refill - Medication: glucose blood (FREESTYLE LITE) test strip  Has the patient contacted their pharmacy? Yes.   (Agent: If no, request that the patient contact the pharmacy for the refill.) (Agent: If yes, when and what did the pharmacy advise?)  Preferred Pharmacy (with phone number or street name): [place], [place] - [address]  Agent: Please be advised that RX refills may take up to 3 business days. We ask that you follow-up with your pharmacy.

## 2019-02-04 NOTE — Telephone Encounter (Signed)
Requested medication (s) are due for refill today: yes  Requested medication (s) are on the active medication list: yes   Future visit scheduled: no  Notes to clinic: last filled by different provider    Requested Prescriptions  Pending Prescriptions Disp Refills   glucose monitoring kit (FREESTYLE) monitoring kit 1 each 1    Sig: 1 each by Does not apply route 4 (four) times daily - after meals and at bedtime. 1 month Diabetic Testing Supplies for QAC-QHS accuchecks. Any brand OK. Diagnosis E11.65     Endocrinology: Diabetes - Testing Supplies Passed - 02/04/2019 11:37 AM      Passed - Valid encounter within last 12 months    Recent Outpatient Visits          2 months ago Vitamin D deficiency   [facility] -[facility], Laura Woodruff, FNP   8 months ago Type 2 diabetes mellitus with other circulatory complication, with long-term current use of insulin ([facility])   [facility] -[facility]bley, Ashleigh N, NP   11 months ago Essential hypertension   [facility] -[facility]y, Ashleigh N, NP   1 year ago Essential hypertension   [facility] -[facility]y, Ashleigh N, NP   1 year ago Type 2 diabetes mellitus with other circulatory complication, with long-term current use of insulin ([facility])   [facility] -[facility]y, Ashleigh N, NP                 

## 2019-02-07 MED ORDER — FREESTYLE SYSTEM KIT: 1.0000 | PACK | Freq: Three times a day (TID) | 1 refills | Status: AC

## 2019-03-08 ENCOUNTER — Telehealth: Payer: Self-pay | Admitting: Family

## 2019-03-08 ENCOUNTER — Other Ambulatory Visit: Payer: Self-pay | Admitting: Family

## 2019-03-08 DIAGNOSIS — Z794 Long term (current) use of insulin: Secondary | ICD-10-CM

## 2019-03-08 NOTE — Telephone Encounter (Signed)
Pt called in to schedule her follow up with PCP. Pt says that she usually have labs before visit, pt would like to have assistance with having orders placed for visit. Pt is scheduled to come in on Friday.

## 2019-03-08 NOTE — Telephone Encounter (Signed)
Her labs are in place- she can come get them before her appointment on Friday.

## 2019-03-09 ENCOUNTER — Other Ambulatory Visit (INDEPENDENT_AMBULATORY_CARE_PROVIDER_SITE_OTHER): Payer: 59

## 2019-03-09 DIAGNOSIS — Z794 Long term (current) use of insulin: Secondary | ICD-10-CM

## 2019-03-09 DIAGNOSIS — E1159 Type 2 diabetes mellitus with other circulatory complications: Secondary | ICD-10-CM | POA: Diagnosis not present

## 2019-03-09 LAB — CBC WITH DIFFERENTIAL/PLATELET
Basophils Absolute: 0.1 10*3/uL (ref 0.0–0.1)
Basophils Relative: 0.7 % (ref 0.0–3.0)
Eosinophils Absolute: 0.3 10*3/uL (ref 0.0–0.7)
Eosinophils Relative: 2.2 % (ref 0.0–5.0)
HCT: 34.3 % — ABNORMAL LOW (ref 36.0–46.0)
Hemoglobin: 11.3 g/dL — ABNORMAL LOW (ref 12.0–15.0)
Lymphocytes Relative: 17 % (ref 12.0–46.0)
Lymphs Abs: 2.2 10*3/uL (ref 0.7–4.0)
MCHC: 32.9 g/dL (ref 30.0–36.0)
MCV: 88.3 fl (ref 78.0–100.0)
Monocytes Absolute: 0.8 10*3/uL (ref 0.1–1.0)
Monocytes Relative: 6.2 % (ref 3.0–12.0)
Neutro Abs: 9.4 10*3/uL — ABNORMAL HIGH (ref 1.4–7.7)
Neutrophils Relative %: 73.9 % (ref 43.0–77.0)
Platelets: 293 10*3/uL (ref 150.0–400.0)
RBC: 3.89 Mil/uL (ref 3.87–5.11)
RDW: 14.4 % (ref 11.5–15.5)
WBC: 12.7 10*3/uL — ABNORMAL HIGH (ref 4.0–10.5)

## 2019-03-09 LAB — COMPREHENSIVE METABOLIC PANEL
ALT: 10 U/L (ref 0–35)
AST: 10 U/L (ref 0–37)
Albumin: 3.5 g/dL (ref 3.5–5.2)
Alkaline Phosphatase: 111 U/L (ref 39–117)
BUN: 17 mg/dL (ref 6–23)
CO2: 30 mEq/L (ref 19–32)
Calcium: 9 mg/dL (ref 8.4–10.5)
Chloride: 107 mEq/L (ref 96–112)
Creatinine, Ser: 0.99 mg/dL (ref 0.40–1.20)
GFR: 56.75 mL/min — ABNORMAL LOW (ref >60.00)
Glucose, Bld: 213 mg/dL — ABNORMAL HIGH (ref 70–99)
Potassium: 4.5 mEq/L (ref 3.5–5.1)
Sodium: 142 mEq/L (ref 135–145)
Total Bilirubin: 0.6 mg/dL (ref 0.2–1.2)
Total Protein: 6.3 g/dL (ref 6.0–8.3)

## 2019-03-09 LAB — HEMOGLOBIN A1C: Hgb A1c MFr Bld: 7 % — ABNORMAL HIGH (ref 4.6–6.5)

## 2019-03-09 NOTE — Telephone Encounter (Signed)
Spoke with patient and info given 

## 2019-03-11 ENCOUNTER — Encounter: Payer: Self-pay | Admitting: Family

## 2019-03-11 ENCOUNTER — Other Ambulatory Visit: Payer: Self-pay

## 2019-03-11 ENCOUNTER — Ambulatory Visit (INDEPENDENT_AMBULATORY_CARE_PROVIDER_SITE_OTHER): Payer: 59 | Admitting: Family

## 2019-03-11 VITALS — BP 128/72 | HR 76 | Temp 97.8°F | Ht 64.0 in | Wt 220.1 lb

## 2019-03-11 DIAGNOSIS — Z89411 Acquired absence of right great toe: Secondary | ICD-10-CM | POA: Insufficient documentation

## 2019-03-11 DIAGNOSIS — I10 Essential (primary) hypertension: Secondary | ICD-10-CM

## 2019-03-11 DIAGNOSIS — Z794 Long term (current) use of insulin: Secondary | ICD-10-CM

## 2019-03-11 DIAGNOSIS — D72829 Elevated white blood cell count, unspecified: Secondary | ICD-10-CM | POA: Diagnosis not present

## 2019-03-11 DIAGNOSIS — E1159 Type 2 diabetes mellitus with other circulatory complications: Secondary | ICD-10-CM | POA: Diagnosis not present

## 2019-03-11 DIAGNOSIS — L97511 Non-pressure chronic ulcer of other part of right foot limited to breakdown of skin: Secondary | ICD-10-CM

## 2019-03-11 DIAGNOSIS — I739 Peripheral vascular disease, unspecified: Secondary | ICD-10-CM
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
## 2019-04-04 ENCOUNTER — Other Ambulatory Visit: Payer: Self-pay | Admitting: Family

## 2019-04-04 DIAGNOSIS — G59 Mononeuropathy in diseases classified elsewhere: Secondary | ICD-10-CM

## 2019-04-07 ENCOUNTER — Other Ambulatory Visit: Payer: Self-pay | Admitting: Internal Medicine

## 2019-04-07 DIAGNOSIS — G59 Mononeuropathy in diseases classified elsewhere: Secondary | ICD-10-CM

## 2019-04-07 MED ORDER — GABAPENTIN 100 MG PO CAPS: 300.0000 mg | ORAL_CAPSULE | Freq: Two times a day (BID) | ORAL | 1 refills | Status: DC

## 2019-04-07 NOTE — Telephone Encounter (Signed)
Done erx 

## 2019-04-18 ENCOUNTER — Other Ambulatory Visit: Payer: Self-pay | Admitting: Family

## 2019-04-18 DIAGNOSIS — E11628 Type 2 diabetes mellitus with other skin complications: Secondary | ICD-10-CM

## 2019-04-18 DIAGNOSIS — G59 Mononeuropathy in diseases classified elsewhere: Secondary | ICD-10-CM

## 2019-04-18 DIAGNOSIS — Z794 Long term (current) use of insulin: Secondary | ICD-10-CM

## 2019-04-18 DIAGNOSIS — E1159 Type 2 diabetes mellitus with other circulatory complications: Secondary | ICD-10-CM

## 2019-04-18 DIAGNOSIS — E785 Hyperlipidemia, unspecified: Secondary | ICD-10-CM

## 2019-04-18 DIAGNOSIS — I10 Essential (primary) hypertension: Secondary | ICD-10-CM

## 2019-04-18 MED ORDER — CARVEDILOL 25 MG PO TABS: 25.0000 mg | ORAL_TABLET | Freq: Two times a day (BID) | ORAL | 3 refills | Status: DC

## 2019-04-18 MED ORDER — FREESTYLE LITE TEST VI STRP: ORAL_STRIP | 3 refills | Status: DC

## 2019-04-18 MED ORDER — LISINOPRIL 10 MG PO TABS: 10.0000 mg | ORAL_TABLET | Freq: Every day | ORAL | 3 refills | Status: DC

## 2019-04-18 MED ORDER — HYDRALAZINE HCL 50 MG PO TABS: ORAL_TABLET | ORAL | 3 refills | Status: DC

## 2019-04-18 MED ORDER — AMLODIPINE BESYLATE 10 MG PO TABS: 10.0000 mg | ORAL_TABLET | Freq: Every day | ORAL | 3 refills | Status: DC

## 2019-04-18 MED ORDER — RELION PEN NEEDLES 31G X 8 MM MISC: 3 refills | Status: DC

## 2019-04-18 MED ORDER — INSULIN LISPRO PROT & LISPRO (75-25 MIX) 100 UNIT/ML KWIKPEN: PEN_INJECTOR | SUBCUTANEOUS | 3 refills | Status: DC

## 2019-04-18 MED ORDER — ATORVASTATIN CALCIUM 40 MG PO TABS: 40.0000 mg | ORAL_TABLET | Freq: Every day | ORAL | 3 refills | Status: DC

## 2019-04-18 MED ORDER — EZETIMIBE 10 MG PO TABS: 10.0000 mg | ORAL_TABLET | Freq: Every day | ORAL | 3 refills | Status: DC

## 2019-04-18 MED ORDER — METFORMIN HCL 500 MG PO TABS: 500.0000 mg | ORAL_TABLET | Freq: Two times a day (BID) | ORAL | 0 refills | Status: DC

## 2019-04-18 MED ORDER — GABAPENTIN 100 MG PO CAPS: 300.0000 mg | ORAL_CAPSULE | Freq: Two times a day (BID) | ORAL | 1 refills | Status: DC

## 2019-07-12 ENCOUNTER — Telehealth: Payer: Self-pay

## 2019-07-12 NOTE — Telephone Encounter (Signed)
Please advise.  Did you want to order labs to go over with her during her visit like last time?

## 2019-07-12 NOTE — Telephone Encounter (Signed)
New message   Need labs work prior to appt on 4.28.21

## 2019-07-13 ENCOUNTER — Other Ambulatory Visit: Payer: Self-pay | Admitting: Family

## 2019-07-13 ENCOUNTER — Telehealth: Payer: Self-pay | Admitting: Family

## 2019-07-13 DIAGNOSIS — E1159 Type 2 diabetes mellitus with other circulatory complications: Secondary | ICD-10-CM

## 2019-07-13 DIAGNOSIS — Z794 Long term (current) use of insulin: Secondary | ICD-10-CM

## 2019-07-13 NOTE — Telephone Encounter (Signed)
Spoke with patient today and lab appointment scheduled.   

## 2019-07-13 NOTE — Telephone Encounter (Signed)
Spoke with patient today and lab appointment scheduled.

## 2019-07-13 NOTE — Telephone Encounter (Signed)
Labs are ordered; she likes to do her labs about a week before her appointment. I ordered for [facility].

## 2019-07-13 NOTE — Telephone Encounter (Signed)
Patient has an appointment on 4/28. She states she always does labs before her appointments. She is requesting those orders be put in.

## 2019-07-21 ENCOUNTER — Telehealth: Payer: Self-pay | Admitting: Family

## 2019-07-21 NOTE — Telephone Encounter (Signed)
New Message:   1.Medication Requested: metFORMIN (GLUCOPHAGE) 500 MG tablet Insulin Lispro Prot & Lispro (HUMALOG MIX 75/25 KWIKPEN) (75-25) 100 UNIT/ML Kwikpen gabapentin (NEURONTIN) 100 MG capsule 2. Pharmacy (Name, Street, [place]): [place], [place] 3. On Med List: yes  4. Last Visit with PCP: 03/11/19  5. Next visit date with PCP: 08/03/19  Pt states she also needs a diabetic test kit. She states the one she currently uses they don't supply it. She states the phone number [phone] to [facility]: Please be advised that RX refills may take up to 3 business days. We ask that you follow-up with your pharmacy.

## 2019-07-22 ENCOUNTER — Other Ambulatory Visit: Payer: Self-pay | Admitting: Family

## 2019-07-22 DIAGNOSIS — Z794 Long term (current) use of insulin: Secondary | ICD-10-CM

## 2019-07-22 DIAGNOSIS — E1159 Type 2 diabetes mellitus with other circulatory complications: Secondary | ICD-10-CM

## 2019-07-22 DIAGNOSIS — G59 Mononeuropathy in diseases classified elsewhere: Secondary | ICD-10-CM

## 2019-07-22 MED ORDER — METFORMIN HCL 500 MG PO TABS: 500.0000 mg | ORAL_TABLET | Freq: Two times a day (BID) | ORAL | 3 refills | Status: DC

## 2019-07-22 MED ORDER — INSULIN LISPRO PROT & LISPRO (75-25 MIX) 100 UNIT/ML KWIKPEN: PEN_INJECTOR | SUBCUTANEOUS | 3 refills | Status: DC

## 2019-07-22 MED ORDER — BLOOD GLUCOSE MONITOR KIT: PACK | 0 refills | Status: DC

## 2019-07-22 MED ORDER — GABAPENTIN 100 MG PO CAPS: 300.0000 mg | ORAL_CAPSULE | Freq: Two times a day (BID) | ORAL | 1 refills | Status: DC

## 2019-07-22 NOTE — Telephone Encounter (Signed)
Meds were sent to [facility]; glucometer order was faxed to [facility];

## 2019-08-02 ENCOUNTER — Other Ambulatory Visit: Payer: Self-pay

## 2019-08-02 ENCOUNTER — Other Ambulatory Visit (INDEPENDENT_AMBULATORY_CARE_PROVIDER_SITE_OTHER): Payer: Medicare Other

## 2019-08-02 DIAGNOSIS — Z794 Long term (current) use of insulin: Secondary | ICD-10-CM

## 2019-08-02 DIAGNOSIS — E1159 Type 2 diabetes mellitus with other circulatory complications: Secondary | ICD-10-CM | POA: Diagnosis not present

## 2019-08-02 LAB — CBC WITH DIFFERENTIAL/PLATELET
Basophils Absolute: 0 10*3/uL (ref 0.0–0.1)
Basophils Relative: 0.4 % (ref 0.0–3.0)
Eosinophils Absolute: 0.3 10*3/uL (ref 0.0–0.7)
Eosinophils Relative: 2.3 % (ref 0.0–5.0)
HCT: 33.6 % — ABNORMAL LOW (ref 36.0–46.0)
Hemoglobin: 11 g/dL — ABNORMAL LOW (ref 12.0–15.0)
Lymphocytes Relative: 18.5 % (ref 12.0–46.0)
Lymphs Abs: 2.5 10*3/uL (ref 0.7–4.0)
MCHC: 32.8 g/dL (ref 30.0–36.0)
MCV: 89.9 fl (ref 78.0–100.0)
Monocytes Absolute: 0.9 10*3/uL (ref 0.1–1.0)
Monocytes Relative: 6.7 % (ref 3.0–12.0)
Neutro Abs: 9.6 10*3/uL — ABNORMAL HIGH (ref 1.4–7.7)
Neutrophils Relative %: 72.1 % (ref 43.0–77.0)
Platelets: 339 10*3/uL (ref 150.0–400.0)
RBC: 3.74 Mil/uL — ABNORMAL LOW (ref 3.87–5.11)
RDW: 14.4 % (ref 11.5–15.5)
WBC: 13.3 10*3/uL — ABNORMAL HIGH (ref 4.0–10.5)

## 2019-08-02 LAB — COMPREHENSIVE METABOLIC PANEL
ALT: 9 U/L (ref 0–35)
AST: 10 U/L (ref 0–37)
Albumin: 3.6 g/dL (ref 3.5–5.2)
Alkaline Phosphatase: 116 U/L (ref 39–117)
BUN: 28 mg/dL — ABNORMAL HIGH (ref 6–23)
CO2: 29 mEq/L (ref 19–32)
Calcium: 9.1 mg/dL (ref 8.4–10.5)
Chloride: 108 mEq/L (ref 96–112)
Creatinine, Ser: 1.27 mg/dL — ABNORMAL HIGH (ref 0.40–1.20)
GFR: 42.52 mL/min — ABNORMAL LOW (ref >60.00)
Glucose, Bld: 153 mg/dL — ABNORMAL HIGH (ref 70–99)
Potassium: 5.1 mEq/L (ref 3.5–5.1)
Sodium: 142 mEq/L (ref 135–145)
Total Bilirubin: 0.5 mg/dL (ref 0.2–1.2)
Total Protein: 6.4 g/dL (ref 6.0–8.3)

## 2019-08-02 LAB — HEMOGLOBIN A1C: Hgb A1c MFr Bld: 7.2 % — ABNORMAL HIGH (ref 4.6–6.5)

## 2019-08-03 ENCOUNTER — Encounter: Payer: Self-pay | Admitting: Family

## 2019-08-03 ENCOUNTER — Ambulatory Visit: Payer: 59 | Admitting: Family

## 2019-08-03 ENCOUNTER — Ambulatory Visit (INDEPENDENT_AMBULATORY_CARE_PROVIDER_SITE_OTHER): Payer: Medicare Other | Admitting: Family

## 2019-08-03 ENCOUNTER — Other Ambulatory Visit: Payer: Self-pay

## 2019-08-03 VITALS — BP 126/70 | HR 80 | Temp 98.0°F | Ht 64.0 in | Wt 223.4 lb

## 2019-08-03 DIAGNOSIS — D72829 Elevated white blood cell count, unspecified: Secondary | ICD-10-CM | POA: Diagnosis not present

## 2019-08-03 DIAGNOSIS — Z794 Long term (current) use of insulin: Secondary | ICD-10-CM | POA: Diagnosis not present

## 2019-08-03 DIAGNOSIS — E1159 Type 2 diabetes mellitus with other circulatory complications: Secondary | ICD-10-CM | POA: Diagnosis not present

## 2019-08-03 DIAGNOSIS — L03032 Cellulitis of left toe: Secondary | ICD-10-CM | POA: Diagnosis not present

## 2019-08-03 MED ORDER — BLOOD GLUCOSE MONITOR KIT: PACK | 0 refills | Status: DC

## 2019-08-03 MED ORDER — SULFAMETHOXAZOLE-TRIMETHOPRIM 800-160 MG PO TABS: 1.0000 | ORAL_TABLET | Freq: Two times a day (BID) | ORAL | 0 refills | Status: DC

## 2019-08-03 MED ORDER — MUPIROCIN 2 % EX OINT: 1.0000 "application " | TOPICAL_OINTMENT | Freq: Two times a day (BID) | CUTANEOUS | 0 refills | Status: DC

## 2019-08-03 MED ORDER — FREESTYLE LITE TEST VI STRP: ORAL_STRIP | 3 refills | Status: DC
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
## 2019-08-05 ENCOUNTER — Other Ambulatory Visit: Payer: Self-pay | Admitting: Family

## 2019-08-05 MED ORDER — BLOOD GLUCOSE MONITOR KIT: PACK | 0 refills | Status: DC

## 2019-10-03 ENCOUNTER — Other Ambulatory Visit: Payer: Self-pay | Admitting: Family

## 2019-10-03 DIAGNOSIS — I10 Essential (primary) hypertension: Secondary | ICD-10-CM

## 2020-01-20 ENCOUNTER — Other Ambulatory Visit: Payer: Self-pay | Admitting: Family

## 2020-01-20 DIAGNOSIS — Z794 Long term (current) use of insulin: Secondary | ICD-10-CM

## 2020-01-20 DIAGNOSIS — E1159 Type 2 diabetes mellitus with other circulatory complications: Secondary | ICD-10-CM

## 2020-01-23 ENCOUNTER — Telehealth: Payer: Self-pay | Admitting: Family

## 2020-01-23 ENCOUNTER — Other Ambulatory Visit: Payer: Self-pay | Admitting: Family

## 2020-01-23 DIAGNOSIS — Z794 Long term (current) use of insulin: Secondary | ICD-10-CM

## 2020-01-23 NOTE — Telephone Encounter (Signed)
    Patient requesting order for 6 month labs 

## 2020-01-23 NOTE — Telephone Encounter (Signed)
Labs are in place at [facility]; she can go at her convenience prior to her upcoming appointment.

## 2020-01-23 NOTE — Telephone Encounter (Signed)
You need to let her know to go to [facility] for her labs; please look at note below.

## 2020-01-23 NOTE — Telephone Encounter (Signed)
Voicemail left to return office call see last note

## 2020-02-02 ENCOUNTER — Other Ambulatory Visit (INDEPENDENT_AMBULATORY_CARE_PROVIDER_SITE_OTHER): Payer: Medicare Other

## 2020-02-02 DIAGNOSIS — E1159 Type 2 diabetes mellitus with other circulatory complications: Secondary | ICD-10-CM | POA: Diagnosis not present

## 2020-02-02 DIAGNOSIS — Z794 Long term (current) use of insulin: Secondary | ICD-10-CM | POA: Diagnosis not present

## 2020-02-02 LAB — CBC WITH DIFFERENTIAL/PLATELET
Basophils Absolute: 0.1 10*3/uL (ref 0.0–0.1)
Basophils Relative: 0.5 % (ref 0.0–3.0)
Eosinophils Absolute: 0.2 10*3/uL (ref 0.0–0.7)
Eosinophils Relative: 1.6 % (ref 0.0–5.0)
HCT: 35.5 % — ABNORMAL LOW (ref 36.0–46.0)
Hemoglobin: 11.8 g/dL — ABNORMAL LOW (ref 12.0–15.0)
Lymphocytes Relative: 19.2 % (ref 12.0–46.0)
Lymphs Abs: 2.8 10*3/uL (ref 0.7–4.0)
MCHC: 33.3 g/dL (ref 30.0–36.0)
MCV: 89.5 fl (ref 78.0–100.0)
Monocytes Absolute: 0.9 10*3/uL (ref 0.1–1.0)
Monocytes Relative: 6.4 % (ref 3.0–12.0)
Neutro Abs: 10.4 10*3/uL — ABNORMAL HIGH (ref 1.4–7.7)
Neutrophils Relative %: 72.3 % (ref 43.0–77.0)
Platelets: 323 10*3/uL (ref 150.0–400.0)
RBC: 3.97 Mil/uL (ref 3.87–5.11)
RDW: 13.7 % (ref 11.5–15.5)
WBC: 14.4 10*3/uL — ABNORMAL HIGH (ref 4.0–10.5)

## 2020-02-02 LAB — COMPREHENSIVE METABOLIC PANEL
ALT: 9 U/L (ref 0–35)
AST: 9 U/L (ref 0–37)
Albumin: 3.7 g/dL (ref 3.5–5.2)
Alkaline Phosphatase: 113 U/L (ref 39–117)
BUN: 23 mg/dL (ref 6–23)
CO2: 26 mEq/L (ref 19–32)
Calcium: 9 mg/dL (ref 8.4–10.5)
Chloride: 105 mEq/L (ref 96–112)
Creatinine, Ser: 1.34 mg/dL — ABNORMAL HIGH (ref 0.40–1.20)
GFR: 42.24 mL/min — ABNORMAL LOW (ref >60.00)
Glucose, Bld: 230 mg/dL — ABNORMAL HIGH (ref 70–99)
Potassium: 3.8 mEq/L (ref 3.5–5.1)
Sodium: 142 mEq/L (ref 135–145)
Total Bilirubin: 0.6 mg/dL (ref 0.2–1.2)
Total Protein: 6.4 g/dL (ref 6.0–8.3)

## 2020-02-02 LAB — LDL CHOLESTEROL, DIRECT: Direct LDL: 81 mg/dL

## 2020-02-02 LAB — LIPID PANEL
Cholesterol: 146 mg/dL (ref 0–200)
HDL: 36.4 mg/dL — ABNORMAL LOW (ref >39.00)
NonHDL: 109.76
Total CHOL/HDL Ratio: 4
Triglycerides: 218 mg/dL — ABNORMAL HIGH (ref 0.0–149.0)
VLDL: 43.6 mg/dL — ABNORMAL HIGH (ref 0.0–40.0)

## 2020-02-02 LAB — HEMOGLOBIN A1C: Hgb A1c MFr Bld: 8.2 % — ABNORMAL HIGH (ref 4.6–6.5)

## 2020-02-03 ENCOUNTER — Encounter: Payer: Self-pay | Admitting: Family

## 2020-02-03 ENCOUNTER — Ambulatory Visit (INDEPENDENT_AMBULATORY_CARE_PROVIDER_SITE_OTHER): Payer: Medicare Other | Admitting: Family

## 2020-02-03 ENCOUNTER — Other Ambulatory Visit: Payer: Self-pay

## 2020-02-03 VITALS — BP 138/64 | HR 68 | Temp 98.3°F | Ht 64.0 in | Wt 226.0 lb

## 2020-02-03 DIAGNOSIS — I10 Essential (primary) hypertension: Secondary | ICD-10-CM | POA: Diagnosis not present

## 2020-02-03 DIAGNOSIS — D72829 Elevated white blood cell count, unspecified: Secondary | ICD-10-CM

## 2020-02-03 DIAGNOSIS — Z794 Long term (current) use of insulin: Secondary | ICD-10-CM

## 2020-02-03 DIAGNOSIS — E1159 Type 2 diabetes mellitus with other circulatory complications: Secondary | ICD-10-CM

## 2020-02-03 DIAGNOSIS — Z720 Tobacco use: Secondary | ICD-10-CM | POA: Diagnosis not present

## 2020-02-03 DIAGNOSIS — E785 Hyperlipidemia, unspecified: Secondary | ICD-10-CM

## 2020-02-03 NOTE — Patient Instructions (Signed)
Please take Metformin one in the am and one in the pm;
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
## 2020-03-11 ENCOUNTER — Other Ambulatory Visit: Payer: Self-pay | Admitting: Family

## 2020-03-11 DIAGNOSIS — I10 Essential (primary) hypertension: Secondary | ICD-10-CM

## 2020-03-11 DIAGNOSIS — E785 Hyperlipidemia, unspecified: Secondary | ICD-10-CM

## 2020-03-11 DIAGNOSIS — G59 Mononeuropathy in diseases classified elsewhere: Secondary | ICD-10-CM

## 2020-03-12 ENCOUNTER — Other Ambulatory Visit: Payer: Self-pay | Admitting: Family

## 2020-06-14 ENCOUNTER — Telehealth: Payer: Self-pay | Admitting: Family

## 2020-06-14 DIAGNOSIS — Z794 Long term (current) use of insulin: Secondary | ICD-10-CM

## 2020-06-14 DIAGNOSIS — E1159 Type 2 diabetes mellitus with other circulatory complications: Secondary | ICD-10-CM

## 2020-06-14 NOTE — Telephone Encounter (Signed)
Patient is requesting lab work before being seen on 3.18.22. Please advise.

## 2020-06-18 NOTE — Telephone Encounter (Signed)
Orders in place at [facility]; as long as she goes by Thursday am, we should have for Friday;

## 2020-06-18 NOTE — Telephone Encounter (Signed)
Pt aware that she can go to [facility] by Thurs am; verb understanding.

## 2020-06-21 ENCOUNTER — Other Ambulatory Visit (INDEPENDENT_AMBULATORY_CARE_PROVIDER_SITE_OTHER): Payer: Medicare Other

## 2020-06-21 DIAGNOSIS — Z794 Long term (current) use of insulin: Secondary | ICD-10-CM

## 2020-06-21 DIAGNOSIS — E1159 Type 2 diabetes mellitus with other circulatory complications: Secondary | ICD-10-CM | POA: Diagnosis not present

## 2020-06-21 LAB — CBC WITH DIFFERENTIAL/PLATELET
Basophils Absolute: 0.1 10*3/uL (ref 0.0–0.1)
Basophils Relative: 0.6 % (ref 0.0–3.0)
Eosinophils Absolute: 0.3 10*3/uL (ref 0.0–0.7)
Eosinophils Relative: 2 % (ref 0.0–5.0)
HCT: 35.2 % — ABNORMAL LOW (ref 36.0–46.0)
Hemoglobin: 11.5 g/dL — ABNORMAL LOW (ref 12.0–15.0)
Lymphocytes Relative: 19.3 % (ref 12.0–46.0)
Lymphs Abs: 2.8 10*3/uL (ref 0.7–4.0)
MCHC: 32.6 g/dL (ref 30.0–36.0)
MCV: 88.1 fl (ref 78.0–100.0)
Monocytes Absolute: 0.9 10*3/uL (ref 0.1–1.0)
Monocytes Relative: 6.5 % (ref 3.0–12.0)
Neutro Abs: 10.2 10*3/uL — ABNORMAL HIGH (ref 1.4–7.7)
Neutrophils Relative %: 71.6 % (ref 43.0–77.0)
Platelets: 311 10*3/uL (ref 150.0–400.0)
RBC: 3.99 Mil/uL (ref 3.87–5.11)
RDW: 14.7 % (ref 11.5–15.5)
WBC: 14.3 10*3/uL — ABNORMAL HIGH (ref 4.0–10.5)

## 2020-06-21 LAB — LDL CHOLESTEROL, DIRECT: Direct LDL: 60 mg/dL

## 2020-06-21 LAB — LIPID PANEL
Cholesterol: 124 mg/dL (ref 0–200)
HDL: 38.5 mg/dL — ABNORMAL LOW (ref >39.00)
NonHDL: 85.38
Total CHOL/HDL Ratio: 3
Triglycerides: 204 mg/dL — ABNORMAL HIGH (ref 0.0–149.0)
VLDL: 40.8 mg/dL — ABNORMAL HIGH (ref 0.0–40.0)

## 2020-06-21 LAB — COMPREHENSIVE METABOLIC PANEL
ALT: 8 U/L (ref 0–35)
AST: 11 U/L (ref 0–37)
Albumin: 3.5 g/dL (ref 3.5–5.2)
Alkaline Phosphatase: 107 U/L (ref 39–117)
BUN: 20 mg/dL (ref 6–23)
CO2: 27 mEq/L (ref 19–32)
Calcium: 9.1 mg/dL (ref 8.4–10.5)
Chloride: 104 mEq/L (ref 96–112)
Creatinine, Ser: 1.19 mg/dL (ref 0.40–1.20)
GFR: 48.58 mL/min — ABNORMAL LOW (ref >60.00)
Glucose, Bld: 132 mg/dL — ABNORMAL HIGH (ref 70–99)
Potassium: 4.5 mEq/L (ref 3.5–5.1)
Sodium: 141 mEq/L (ref 135–145)
Total Bilirubin: 0.8 mg/dL (ref 0.2–1.2)
Total Protein: 6.3 g/dL (ref 6.0–8.3)

## 2020-06-21 LAB — HEMOGLOBIN A1C: Hgb A1c MFr Bld: 8 % — ABNORMAL HIGH (ref 4.6–6.5)

## 2020-06-22 ENCOUNTER — Encounter: Payer: Self-pay | Admitting: Family

## 2020-06-22 ENCOUNTER — Other Ambulatory Visit: Payer: Self-pay

## 2020-06-22 ENCOUNTER — Ambulatory Visit (INDEPENDENT_AMBULATORY_CARE_PROVIDER_SITE_OTHER): Payer: Medicare Other | Admitting: Family

## 2020-06-22 VITALS — BP 138/80 | HR 73 | Temp 98.0°F | Ht 63.0 in | Wt 231.0 lb

## 2020-06-22 DIAGNOSIS — I10 Essential (primary) hypertension: Secondary | ICD-10-CM

## 2020-06-22 DIAGNOSIS — Z794 Long term (current) use of insulin: Secondary | ICD-10-CM

## 2020-06-22 DIAGNOSIS — E1159 Type 2 diabetes mellitus with other circulatory complications: Secondary | ICD-10-CM

## 2020-06-22 MED ORDER — METFORMIN HCL 500 MG PO TABS: 500.0000 mg | ORAL_TABLET | Freq: Two times a day (BID) | ORAL | 3 refills | Status: DC
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
## 2020-07-29 ENCOUNTER — Other Ambulatory Visit: Payer: Self-pay

## 2020-07-29 ENCOUNTER — Emergency Department (HOSPITAL_COMMUNITY)
Admission: EM | Admit: 2020-07-29 | Discharge: 2020-07-30 | Disposition: A | Discharge status: Home / Self Care | Payer: Medicare Other | Attending: Emergency Medicine | Admitting: Emergency Medicine

## 2020-07-29 ENCOUNTER — Encounter (HOSPITAL_COMMUNITY): Payer: Self-pay

## 2020-07-29 DIAGNOSIS — R112 Nausea with vomiting, unspecified: Secondary | ICD-10-CM | POA: Diagnosis not present

## 2020-07-29 DIAGNOSIS — Z743 Need for continuous supervision: Secondary | ICD-10-CM | POA: Diagnosis not present

## 2020-07-29 DIAGNOSIS — L03116 Cellulitis of left lower limb: Secondary | ICD-10-CM | POA: Diagnosis present

## 2020-07-29 DIAGNOSIS — M545 Low back pain, unspecified: Secondary | ICD-10-CM | POA: Diagnosis not present

## 2020-07-29 DIAGNOSIS — R600 Localized edema: Secondary | ICD-10-CM | POA: Diagnosis not present

## 2020-07-29 DIAGNOSIS — R059 Cough, unspecified: Secondary | ICD-10-CM | POA: Diagnosis not present

## 2020-07-29 DIAGNOSIS — Z720 Tobacco use: Secondary | ICD-10-CM | POA: Diagnosis not present

## 2020-07-29 DIAGNOSIS — I129 Hypertensive chronic kidney disease with stage 1 through stage 4 chronic kidney disease, or unspecified chronic kidney disease: Secondary | ICD-10-CM | POA: Diagnosis not present

## 2020-07-29 DIAGNOSIS — R509 Fever, unspecified: Secondary | ICD-10-CM | POA: Diagnosis not present

## 2020-07-29 DIAGNOSIS — E785 Hyperlipidemia, unspecified: Secondary | ICD-10-CM | POA: Diagnosis not present

## 2020-07-29 DIAGNOSIS — I130 Hypertensive heart and chronic kidney disease with heart failure and stage 1 through stage 4 chronic kidney disease, or unspecified chronic kidney disease: Secondary | ICD-10-CM | POA: Diagnosis not present

## 2020-07-29 DIAGNOSIS — R6520 Severe sepsis without septic shock: Secondary | ICD-10-CM | POA: Diagnosis present

## 2020-07-29 DIAGNOSIS — I151 Hypertension secondary to other renal disorders: Secondary | ICD-10-CM | POA: Diagnosis not present

## 2020-07-29 DIAGNOSIS — W19XXXA Unspecified fall, initial encounter: Secondary | ICD-10-CM | POA: Diagnosis not present

## 2020-07-29 DIAGNOSIS — Z6841 Body Mass Index (BMI) 40.0 and over, adult: Secondary | ICD-10-CM | POA: Diagnosis not present

## 2020-07-29 DIAGNOSIS — Z9181 History of falling: Secondary | ICD-10-CM | POA: Diagnosis not present

## 2020-07-29 DIAGNOSIS — R9431 Abnormal electrocardiogram [ECG] [EKG]: Secondary | ICD-10-CM | POA: Diagnosis not present

## 2020-07-29 DIAGNOSIS — J189 Pneumonia, unspecified organism: Secondary | ICD-10-CM | POA: Diagnosis present

## 2020-07-29 DIAGNOSIS — R2689 Other abnormalities of gait and mobility: Secondary | ICD-10-CM | POA: Diagnosis not present

## 2020-07-29 DIAGNOSIS — E872 Acidosis: Secondary | ICD-10-CM | POA: Diagnosis present

## 2020-07-29 DIAGNOSIS — R1111 Vomiting without nausea: Secondary | ICD-10-CM | POA: Diagnosis not present

## 2020-07-29 DIAGNOSIS — I517 Cardiomegaly: Secondary | ICD-10-CM | POA: Diagnosis not present

## 2020-07-29 DIAGNOSIS — G8918 Other acute postprocedural pain: Secondary | ICD-10-CM | POA: Diagnosis not present

## 2020-07-29 DIAGNOSIS — G9341 Metabolic encephalopathy: Secondary | ICD-10-CM | POA: Diagnosis not present

## 2020-07-29 DIAGNOSIS — Z20822 Contact with and (suspected) exposure to covid-19: Secondary | ICD-10-CM | POA: Diagnosis present

## 2020-07-29 DIAGNOSIS — I87311 Chronic venous hypertension (idiopathic) with ulcer of right lower extremity: Secondary | ICD-10-CM | POA: Diagnosis not present

## 2020-07-29 DIAGNOSIS — M5431 Sciatica, right side: Secondary | ICD-10-CM | POA: Diagnosis not present

## 2020-07-29 DIAGNOSIS — E11628 Type 2 diabetes mellitus with other skin complications: Secondary | ICD-10-CM | POA: Diagnosis present

## 2020-07-29 DIAGNOSIS — E1141 Type 2 diabetes mellitus with diabetic mononeuropathy: Secondary | ICD-10-CM | POA: Diagnosis not present

## 2020-07-29 DIAGNOSIS — M79672 Pain in left foot: Secondary | ICD-10-CM | POA: Insufficient documentation

## 2020-07-29 DIAGNOSIS — R110 Nausea: Secondary | ICD-10-CM | POA: Diagnosis not present

## 2020-07-29 DIAGNOSIS — M86162 Other acute osteomyelitis, left tibia and fibula: Secondary | ICD-10-CM | POA: Diagnosis not present

## 2020-07-29 DIAGNOSIS — Z79891 Long term (current) use of opiate analgesic: Secondary | ICD-10-CM | POA: Diagnosis not present

## 2020-07-29 DIAGNOSIS — I10 Essential (primary) hypertension: Secondary | ICD-10-CM | POA: Diagnosis not present

## 2020-07-29 DIAGNOSIS — J9690 Respiratory failure, unspecified, unspecified whether with hypoxia or hypercapnia: Secondary | ICD-10-CM | POA: Diagnosis not present

## 2020-07-29 DIAGNOSIS — L97821 Non-pressure chronic ulcer of other part of left lower leg limited to breakdown of skin: Secondary | ICD-10-CM | POA: Diagnosis not present

## 2020-07-29 DIAGNOSIS — F17210 Nicotine dependence, cigarettes, uncomplicated: Secondary | ICD-10-CM | POA: Insufficient documentation

## 2020-07-29 DIAGNOSIS — I739 Peripheral vascular disease, unspecified: Secondary | ICD-10-CM | POA: Diagnosis not present

## 2020-07-29 DIAGNOSIS — I700 Atherosclerosis of aorta: Secondary | ICD-10-CM | POA: Diagnosis not present

## 2020-07-29 DIAGNOSIS — E11621 Type 2 diabetes mellitus with foot ulcer: Secondary | ICD-10-CM | POA: Diagnosis not present

## 2020-07-29 DIAGNOSIS — E119 Type 2 diabetes mellitus without complications: Secondary | ICD-10-CM | POA: Diagnosis not present

## 2020-07-29 DIAGNOSIS — Z89411 Acquired absence of right great toe: Secondary | ICD-10-CM | POA: Diagnosis not present

## 2020-07-29 DIAGNOSIS — I5030 Unspecified diastolic (congestive) heart failure: Secondary | ICD-10-CM | POA: Diagnosis not present

## 2020-07-29 DIAGNOSIS — N1330 Unspecified hydronephrosis: Secondary | ICD-10-CM | POA: Diagnosis not present

## 2020-07-29 DIAGNOSIS — K5903 Drug induced constipation: Secondary | ICD-10-CM | POA: Diagnosis not present

## 2020-07-29 DIAGNOSIS — R262 Difficulty in walking, not elsewhere classified: Secondary | ICD-10-CM | POA: Diagnosis not present

## 2020-07-29 DIAGNOSIS — E118 Type 2 diabetes mellitus with unspecified complications: Secondary | ICD-10-CM | POA: Diagnosis not present

## 2020-07-29 DIAGNOSIS — L02612 Cutaneous abscess of left foot: Secondary | ICD-10-CM | POA: Diagnosis present

## 2020-07-29 DIAGNOSIS — Z7982 Long term (current) use of aspirin: Secondary | ICD-10-CM | POA: Diagnosis not present

## 2020-07-29 DIAGNOSIS — E1142 Type 2 diabetes mellitus with diabetic polyneuropathy: Secondary | ICD-10-CM | POA: Diagnosis not present

## 2020-07-29 DIAGNOSIS — D72825 Bandemia: Secondary | ICD-10-CM | POA: Diagnosis not present

## 2020-07-29 DIAGNOSIS — Z89512 Acquired absence of left leg below knee: Secondary | ICD-10-CM | POA: Diagnosis not present

## 2020-07-29 DIAGNOSIS — E1151 Type 2 diabetes mellitus with diabetic peripheral angiopathy without gangrene: Secondary | ICD-10-CM | POA: Diagnosis not present

## 2020-07-29 DIAGNOSIS — L089 Local infection of the skin and subcutaneous tissue, unspecified: Secondary | ICD-10-CM | POA: Diagnosis not present

## 2020-07-29 DIAGNOSIS — I70262 Atherosclerosis of native arteries of extremities with gangrene, left leg: Secondary | ICD-10-CM | POA: Diagnosis not present

## 2020-07-29 DIAGNOSIS — D508 Other iron deficiency anemias: Secondary | ICD-10-CM | POA: Diagnosis not present

## 2020-07-29 DIAGNOSIS — E875 Hyperkalemia: Secondary | ICD-10-CM | POA: Diagnosis not present

## 2020-07-29 DIAGNOSIS — I248 Other forms of acute ischemic heart disease: Secondary | ICD-10-CM | POA: Diagnosis present

## 2020-07-29 DIAGNOSIS — M1990 Unspecified osteoarthritis, unspecified site: Secondary | ICD-10-CM | POA: Diagnosis present

## 2020-07-29 DIAGNOSIS — D631 Anemia in chronic kidney disease: Secondary | ICD-10-CM | POA: Diagnosis present

## 2020-07-29 DIAGNOSIS — J9601 Acute respiratory failure with hypoxia: Secondary | ICD-10-CM | POA: Diagnosis present

## 2020-07-29 DIAGNOSIS — Z794 Long term (current) use of insulin: Secondary | ICD-10-CM

## 2020-07-29 DIAGNOSIS — Z87891 Personal history of nicotine dependence: Secondary | ICD-10-CM | POA: Diagnosis not present

## 2020-07-29 DIAGNOSIS — Z8249 Family history of ischemic heart disease and other diseases of the circulatory system: Secondary | ICD-10-CM | POA: Diagnosis not present

## 2020-07-29 DIAGNOSIS — M549 Dorsalgia, unspecified: Secondary | ICD-10-CM | POA: Insufficient documentation

## 2020-07-29 DIAGNOSIS — E1140 Type 2 diabetes mellitus with diabetic neuropathy, unspecified: Secondary | ICD-10-CM | POA: Diagnosis not present

## 2020-07-29 DIAGNOSIS — Z7984 Long term (current) use of oral hypoglycemic drugs: Secondary | ICD-10-CM

## 2020-07-29 DIAGNOSIS — S81802A Unspecified open wound, left lower leg, initial encounter: Secondary | ICD-10-CM | POA: Diagnosis not present

## 2020-07-29 DIAGNOSIS — R5381 Other malaise: Secondary | ICD-10-CM | POA: Diagnosis not present

## 2020-07-29 DIAGNOSIS — M7989 Other specified soft tissue disorders: Secondary | ICD-10-CM | POA: Diagnosis not present

## 2020-07-29 DIAGNOSIS — Z4781 Encounter for orthopedic aftercare following surgical amputation: Secondary | ICD-10-CM | POA: Diagnosis not present

## 2020-07-29 DIAGNOSIS — E1122 Type 2 diabetes mellitus with diabetic chronic kidney disease: Secondary | ICD-10-CM | POA: Diagnosis not present

## 2020-07-29 DIAGNOSIS — R141 Gas pain: Secondary | ICD-10-CM | POA: Diagnosis not present

## 2020-07-29 DIAGNOSIS — Z8616 Personal history of COVID-19: Secondary | ICD-10-CM | POA: Diagnosis not present

## 2020-07-29 DIAGNOSIS — N179 Acute kidney failure, unspecified: Secondary | ICD-10-CM | POA: Diagnosis present

## 2020-07-29 DIAGNOSIS — A480 Gas gangrene: Secondary | ICD-10-CM | POA: Diagnosis present

## 2020-07-29 DIAGNOSIS — E876 Hypokalemia: Secondary | ICD-10-CM | POA: Diagnosis present

## 2020-07-29 DIAGNOSIS — M79671 Pain in right foot: Secondary | ICD-10-CM | POA: Insufficient documentation

## 2020-07-29 DIAGNOSIS — R0600 Dyspnea, unspecified: Secondary | ICD-10-CM | POA: Diagnosis not present

## 2020-07-29 DIAGNOSIS — Z888 Allergy status to other drugs, medicaments and biological substances status: Secondary | ICD-10-CM

## 2020-07-29 DIAGNOSIS — R531 Weakness: Secondary | ICD-10-CM | POA: Diagnosis not present

## 2020-07-29 DIAGNOSIS — I872 Venous insufficiency (chronic) (peripheral): Secondary | ICD-10-CM | POA: Diagnosis not present

## 2020-07-29 DIAGNOSIS — Z79899 Other long term (current) drug therapy: Secondary | ICD-10-CM

## 2020-07-29 DIAGNOSIS — R778 Other specified abnormalities of plasma proteins: Secondary | ICD-10-CM | POA: Diagnosis not present

## 2020-07-29 DIAGNOSIS — R069 Unspecified abnormalities of breathing: Secondary | ICD-10-CM | POA: Diagnosis not present

## 2020-07-29 DIAGNOSIS — M2550 Pain in unspecified joint: Secondary | ICD-10-CM | POA: Diagnosis not present

## 2020-07-29 DIAGNOSIS — N1831 Chronic kidney disease, stage 3a: Secondary | ICD-10-CM | POA: Diagnosis not present

## 2020-07-29 DIAGNOSIS — A419 Sepsis, unspecified organism: Principal | ICD-10-CM | POA: Diagnosis present

## 2020-07-29 DIAGNOSIS — Z885 Allergy status to narcotic agent status: Secondary | ICD-10-CM

## 2020-07-29 DIAGNOSIS — I959 Hypotension, unspecified: Secondary | ICD-10-CM | POA: Diagnosis not present

## 2020-07-29 DIAGNOSIS — R109 Unspecified abdominal pain: Secondary | ICD-10-CM | POA: Diagnosis not present

## 2020-07-29 DIAGNOSIS — N2889 Other specified disorders of kidney and ureter: Secondary | ICD-10-CM | POA: Diagnosis not present

## 2020-07-29 DIAGNOSIS — Z833 Family history of diabetes mellitus: Secondary | ICD-10-CM | POA: Diagnosis not present

## 2020-07-29 DIAGNOSIS — E1152 Type 2 diabetes mellitus with diabetic peripheral angiopathy with gangrene: Secondary | ICD-10-CM | POA: Diagnosis not present

## 2020-07-29 DIAGNOSIS — R7989 Other specified abnormal findings of blood chemistry: Secondary | ICD-10-CM | POA: Diagnosis not present

## 2020-07-29 DIAGNOSIS — E11622 Type 2 diabetes mellitus with other skin ulcer: Secondary | ICD-10-CM | POA: Diagnosis not present

## 2020-07-29 DIAGNOSIS — R0609 Other forms of dyspnea: Secondary | ICD-10-CM | POA: Diagnosis not present

## 2020-07-29 DIAGNOSIS — I70238 Atherosclerosis of native arteries of right leg with ulceration of other part of lower right leg: Secondary | ICD-10-CM | POA: Diagnosis not present

## 2020-07-29 DIAGNOSIS — B9689 Other specified bacterial agents as the cause of diseases classified elsewhere: Secondary | ICD-10-CM | POA: Diagnosis not present

## 2020-07-29 DIAGNOSIS — S81802D Unspecified open wound, left lower leg, subsequent encounter: Secondary | ICD-10-CM | POA: Diagnosis not present

## 2020-07-29 DIAGNOSIS — J432 Centrilobular emphysema: Secondary | ICD-10-CM | POA: Diagnosis not present

## 2020-07-29 DIAGNOSIS — Z9889 Other specified postprocedural states: Secondary | ICD-10-CM | POA: Diagnosis not present

## 2020-07-29 DIAGNOSIS — J441 Chronic obstructive pulmonary disease with (acute) exacerbation: Secondary | ICD-10-CM | POA: Diagnosis not present

## 2020-07-29 DIAGNOSIS — M5441 Lumbago with sciatica, right side: Secondary | ICD-10-CM | POA: Diagnosis not present

## 2020-07-29 DIAGNOSIS — N12 Tubulo-interstitial nephritis, not specified as acute or chronic: Secondary | ICD-10-CM | POA: Diagnosis not present

## 2020-07-29 DIAGNOSIS — I5033 Acute on chronic diastolic (congestive) heart failure: Secondary | ICD-10-CM | POA: Diagnosis not present

## 2020-07-29 DIAGNOSIS — Z83438 Family history of other disorder of lipoprotein metabolism and other lipidemia: Secondary | ICD-10-CM | POA: Diagnosis not present

## 2020-07-29 DIAGNOSIS — M109 Gout, unspecified: Secondary | ICD-10-CM | POA: Diagnosis not present

## 2020-07-29 DIAGNOSIS — J9811 Atelectasis: Secondary | ICD-10-CM | POA: Diagnosis not present

## 2020-07-29 DIAGNOSIS — Z4802 Encounter for removal of sutures: Secondary | ICD-10-CM | POA: Diagnosis not present

## 2020-07-29 DIAGNOSIS — G546 Phantom limb syndrome with pain: Secondary | ICD-10-CM | POA: Diagnosis not present

## 2020-07-29 DIAGNOSIS — A4151 Sepsis due to Escherichia coli [E. coli]: Secondary | ICD-10-CM | POA: Diagnosis not present

## 2020-07-29 DIAGNOSIS — G8929 Other chronic pain: Secondary | ICD-10-CM | POA: Diagnosis present

## 2020-07-29 DIAGNOSIS — E1165 Type 2 diabetes mellitus with hyperglycemia: Secondary | ICD-10-CM | POA: Diagnosis present

## 2020-07-29 DIAGNOSIS — J208 Acute bronchitis due to other specified organisms: Secondary | ICD-10-CM | POA: Diagnosis not present

## 2020-07-29 DIAGNOSIS — I70201 Unspecified atherosclerosis of native arteries of extremities, right leg: Secondary | ICD-10-CM | POA: Diagnosis not present

## 2020-07-29 DIAGNOSIS — M726 Necrotizing fasciitis: Secondary | ICD-10-CM | POA: Diagnosis present

## 2020-07-29 DIAGNOSIS — M79662 Pain in left lower leg: Secondary | ICD-10-CM | POA: Diagnosis not present

## 2020-07-29 DIAGNOSIS — M6281 Muscle weakness (generalized): Secondary | ICD-10-CM | POA: Diagnosis not present

## 2020-07-29 DIAGNOSIS — M79604 Pain in right leg: Secondary | ICD-10-CM | POA: Diagnosis not present

## 2020-07-29 DIAGNOSIS — U071 COVID-19: Secondary | ICD-10-CM | POA: Diagnosis not present

## 2020-07-29 DIAGNOSIS — L97929 Non-pressure chronic ulcer of unspecified part of left lower leg with unspecified severity: Secondary | ICD-10-CM | POA: Diagnosis not present

## 2020-07-29 DIAGNOSIS — D649 Anemia, unspecified: Secondary | ICD-10-CM | POA: Diagnosis not present

## 2020-07-29 DIAGNOSIS — Z7401 Bed confinement status: Secondary | ICD-10-CM | POA: Diagnosis not present

## 2020-07-29 DIAGNOSIS — Z139 Encounter for screening, unspecified: Secondary | ICD-10-CM | POA: Diagnosis not present

## 2020-07-29 DIAGNOSIS — R6510 Systemic inflammatory response syndrome (SIRS) of non-infectious origin without acute organ dysfunction: Secondary | ICD-10-CM | POA: Diagnosis not present

## 2020-07-29 DIAGNOSIS — L97818 Non-pressure chronic ulcer of other part of right lower leg with other specified severity: Secondary | ICD-10-CM | POA: Diagnosis not present

## 2020-07-29 DIAGNOSIS — K649 Unspecified hemorrhoids: Secondary | ICD-10-CM | POA: Diagnosis not present

## 2020-07-29 DIAGNOSIS — E1159 Type 2 diabetes mellitus with other circulatory complications: Secondary | ICD-10-CM | POA: Diagnosis not present

## 2020-07-29 DIAGNOSIS — U099 Post covid-19 condition, unspecified: Secondary | ICD-10-CM | POA: Diagnosis not present

## 2020-07-29 DIAGNOSIS — N1832 Chronic kidney disease, stage 3b: Secondary | ICD-10-CM | POA: Diagnosis not present

## 2020-07-29 DIAGNOSIS — M5430 Sciatica, unspecified side: Secondary | ICD-10-CM | POA: Diagnosis not present

## 2020-07-29 DIAGNOSIS — Z4800 Encounter for change or removal of nonsurgical wound dressing: Secondary | ICD-10-CM | POA: Diagnosis not present

## 2020-07-29 DIAGNOSIS — Z23 Encounter for immunization: Secondary | ICD-10-CM | POA: Diagnosis not present

## 2020-07-29 DIAGNOSIS — I5032 Chronic diastolic (congestive) heart failure: Secondary | ICD-10-CM | POA: Diagnosis not present

## 2020-07-29 DIAGNOSIS — Z1612 Extended spectrum beta lactamase (ESBL) resistance: Secondary | ICD-10-CM | POA: Diagnosis not present

## 2020-07-29 DIAGNOSIS — M1030 Gout due to renal impairment, unspecified site: Secondary | ICD-10-CM | POA: Diagnosis not present

## 2020-07-29 DIAGNOSIS — E1169 Type 2 diabetes mellitus with other specified complication: Secondary | ICD-10-CM | POA: Diagnosis not present

## 2020-07-29 DIAGNOSIS — E870 Hyperosmolality and hypernatremia: Secondary | ICD-10-CM | POA: Diagnosis not present

## 2020-07-29 DIAGNOSIS — L97529 Non-pressure chronic ulcer of other part of left foot with unspecified severity: Secondary | ICD-10-CM | POA: Diagnosis present

## 2020-07-29 DIAGNOSIS — K5900 Constipation, unspecified: Secondary | ICD-10-CM | POA: Diagnosis not present

## 2020-07-29 DIAGNOSIS — R0602 Shortness of breath: Secondary | ICD-10-CM | POA: Diagnosis not present

## 2020-07-29 DIAGNOSIS — M79661 Pain in right lower leg: Secondary | ICD-10-CM | POA: Diagnosis not present

## 2020-07-29 DIAGNOSIS — M5124 Other intervertebral disc displacement, thoracic region: Secondary | ICD-10-CM | POA: Diagnosis not present

## 2020-07-29 DIAGNOSIS — G547 Phantom limb syndrome without pain: Secondary | ICD-10-CM | POA: Diagnosis not present

## 2020-07-29 DIAGNOSIS — S81801D Unspecified open wound, right lower leg, subsequent encounter: Secondary | ICD-10-CM | POA: Diagnosis not present

## 2020-07-29 DIAGNOSIS — R17 Unspecified jaundice: Secondary | ICD-10-CM | POA: Diagnosis not present

## 2020-07-29 DIAGNOSIS — B9620 Unspecified Escherichia coli [E. coli] as the cause of diseases classified elsewhere: Secondary | ICD-10-CM | POA: Diagnosis present

## 2020-07-29 DIAGNOSIS — R0902 Hypoxemia: Secondary | ICD-10-CM | POA: Diagnosis not present

## 2020-07-29 DIAGNOSIS — L02416 Cutaneous abscess of left lower limb: Secondary | ICD-10-CM | POA: Diagnosis not present

## 2020-07-29 DIAGNOSIS — S98139A Complete traumatic amputation of one unspecified lesser toe, initial encounter: Secondary | ICD-10-CM | POA: Diagnosis not present

## 2020-07-29 DIAGNOSIS — J40 Bronchitis, not specified as acute or chronic: Secondary | ICD-10-CM | POA: Diagnosis not present

## 2020-07-29 DIAGNOSIS — Z716 Tobacco abuse counseling: Secondary | ICD-10-CM

## 2020-07-29 DIAGNOSIS — M5134 Other intervertebral disc degeneration, thoracic region: Secondary | ICD-10-CM | POA: Diagnosis not present

## 2020-07-29 DIAGNOSIS — M5459 Other low back pain: Secondary | ICD-10-CM | POA: Diagnosis not present

## 2020-07-29 DIAGNOSIS — L97811 Non-pressure chronic ulcer of other part of right lower leg limited to breakdown of skin: Secondary | ICD-10-CM | POA: Diagnosis not present

## 2020-07-29 DIAGNOSIS — R278 Other lack of coordination: Secondary | ICD-10-CM | POA: Diagnosis not present

## 2020-07-29 DIAGNOSIS — E860 Dehydration: Secondary | ICD-10-CM | POA: Diagnosis not present

## 2020-07-29 DIAGNOSIS — R7881 Bacteremia: Secondary | ICD-10-CM | POA: Diagnosis not present

## 2020-07-29 DIAGNOSIS — I152 Hypertension secondary to endocrine disorders: Secondary | ICD-10-CM | POA: Diagnosis not present

## 2020-07-29 DIAGNOSIS — Z0000 Encounter for general adult medical examination without abnormal findings: Secondary | ICD-10-CM | POA: Diagnosis not present

## 2020-07-29 DIAGNOSIS — R0982 Postnasal drip: Secondary | ICD-10-CM | POA: Diagnosis not present

## 2020-07-29 LAB — CBC WITH DIFFERENTIAL/PLATELET
Abs Immature Granulocytes: 0.23 10*3/uL — ABNORMAL HIGH (ref 0.00–0.07)
Basophils Absolute: 0.1 10*3/uL (ref 0.0–0.1)
Basophils Relative: 0 %
Eosinophils Absolute: 0.1 10*3/uL (ref 0.0–0.5)
Eosinophils Relative: 0 %
HCT: 32.7 % — ABNORMAL LOW (ref 36.0–46.0)
Hemoglobin: 10.2 g/dL — ABNORMAL LOW (ref 12.0–15.0)
Immature Granulocytes: 1 %
Lymphocytes Relative: 5 %
Lymphs Abs: 1.3 10*3/uL (ref 0.7–4.0)
MCH: 29.6 pg (ref 26.0–34.0)
MCHC: 31.2 g/dL (ref 30.0–36.0)
MCV: 94.8 fL (ref 80.0–100.0)
Monocytes Absolute: 1.8 10*3/uL — ABNORMAL HIGH (ref 0.1–1.0)
Monocytes Relative: 7 %
Neutro Abs: 21.3 10*3/uL — ABNORMAL HIGH (ref 1.7–7.7)
Neutrophils Relative %: 87 %
Platelets: 351 10*3/uL (ref 150–400)
RBC: 3.45 MIL/uL — ABNORMAL LOW (ref 3.87–5.11)
RDW: 13.9 % (ref 11.5–15.5)
WBC: 24.8 10*3/uL — ABNORMAL HIGH (ref 4.0–10.5)
nRBC: 0 % (ref 0.0–0.2)

## 2020-07-29 LAB — BASIC METABOLIC PANEL
Anion gap: 11 (ref 5–15)
BUN: 25 mg/dL — ABNORMAL HIGH (ref 8–23)
CO2: 24 mmol/L (ref 22–32)
Calcium: 9.2 mg/dL (ref 8.9–10.3)
Chloride: 107 mmol/L (ref 98–111)
Creatinine, Ser: 1.54 mg/dL — ABNORMAL HIGH (ref 0.44–1.00)
GFR, Estimated: 38 mL/min — ABNORMAL LOW (ref >60)
Glucose, Bld: 216 mg/dL — ABNORMAL HIGH (ref 70–99)
Potassium: 4.7 mmol/L (ref 3.5–5.1)
Sodium: 142 mmol/L (ref 135–145)

## 2020-07-29 NOTE — ED Triage Notes (Signed)
Emergency Medicine Provider Triage Evaluation Note  Shirley Chen , a 65 y.o. female  was evaluated in triage.  Pt complains of left leg pain that radiates to low back that started on Monday. History of PAD with right bypass. She also notes she has been experiencing chills. No document fever. Denies chest pain and shortness of breath. Admits to bilateral lower extremity edema which is normal "at the end of the day". No history of CHF. Chronic low back pain which feels similar to this.   Review of Systems  Positive: Leg pain, back pain   Physical Exam  BP 125/78 (BP Location: Left Arm)   Pulse 91   Temp (!) 100.8 F (38.2 C) (Oral)   Resp 19   SpO2 97%  Gen:   Awake, no distress   HEENT:  Atraumatic  Resp:  Normal effort  Cardiac:  Normal rate  Abd:   Nondistended, nontender  MSK:   Tenderness throughout right lower extremity. 2+ pitting edema bilaterally Neuro:  Speech clear   Medical Decision Making  Medically screening exam initiated at 10:14 PM.  Appropriate orders placed.  Lollie Gunner was informed that the remainder of the evaluation will be completed by another provider, this initial triage assessment does not replace that evaluation, and the importance of remaining in the ED until their evaluation is complete.  Clinical Impression  RLE extremity pain. History of PAD with left bypass. Febrile. Labs ordered.    Mannie Stabile, [place] 07/29/20 2222

## 2020-07-29 NOTE — ED Triage Notes (Signed)
Bilateral leg pain but worse on the left especially when ambulating x 1 week. Hx of bypass to right leg.   Denies any recent trauma. Hx of neuropathy.

## 2020-07-30 ENCOUNTER — Emergency Department (HOSPITAL_COMMUNITY): Payer: Medicare Other

## 2020-07-30 DIAGNOSIS — I5033 Acute on chronic diastolic (congestive) heart failure: Secondary | ICD-10-CM | POA: Diagnosis not present

## 2020-07-30 DIAGNOSIS — A419 Sepsis, unspecified organism: Secondary | ICD-10-CM | POA: Diagnosis not present

## 2020-07-30 DIAGNOSIS — E1165 Type 2 diabetes mellitus with hyperglycemia: Secondary | ICD-10-CM | POA: Diagnosis present

## 2020-07-30 DIAGNOSIS — Z888 Allergy status to other drugs, medicaments and biological substances status: Secondary | ICD-10-CM

## 2020-07-30 DIAGNOSIS — N179 Acute kidney failure, unspecified: Secondary | ICD-10-CM | POA: Diagnosis present

## 2020-07-30 DIAGNOSIS — Z6841 Body Mass Index (BMI) 40.0 and over, adult: Secondary | ICD-10-CM

## 2020-07-30 DIAGNOSIS — E1122 Type 2 diabetes mellitus with diabetic chronic kidney disease: Secondary | ICD-10-CM | POA: Diagnosis present

## 2020-07-30 DIAGNOSIS — I130 Hypertensive heart and chronic kidney disease with heart failure and stage 1 through stage 4 chronic kidney disease, or unspecified chronic kidney disease: Secondary | ICD-10-CM | POA: Diagnosis present

## 2020-07-30 DIAGNOSIS — G8929 Other chronic pain: Secondary | ICD-10-CM | POA: Diagnosis present

## 2020-07-30 DIAGNOSIS — E785 Hyperlipidemia, unspecified: Secondary | ICD-10-CM | POA: Diagnosis present

## 2020-07-30 DIAGNOSIS — Z7984 Long term (current) use of oral hypoglycemic drugs: Secondary | ICD-10-CM

## 2020-07-30 DIAGNOSIS — E870 Hyperosmolality and hypernatremia: Secondary | ICD-10-CM | POA: Diagnosis present

## 2020-07-30 DIAGNOSIS — Z7982 Long term (current) use of aspirin: Secondary | ICD-10-CM

## 2020-07-30 DIAGNOSIS — Z8249 Family history of ischemic heart disease and other diseases of the circulatory system: Secondary | ICD-10-CM

## 2020-07-30 DIAGNOSIS — J9601 Acute respiratory failure with hypoxia: Secondary | ICD-10-CM | POA: Diagnosis present

## 2020-07-30 DIAGNOSIS — L97529 Non-pressure chronic ulcer of other part of left foot with unspecified severity: Secondary | ICD-10-CM | POA: Diagnosis present

## 2020-07-30 DIAGNOSIS — D631 Anemia in chronic kidney disease: Secondary | ICD-10-CM | POA: Diagnosis present

## 2020-07-30 DIAGNOSIS — F17210 Nicotine dependence, cigarettes, uncomplicated: Secondary | ICD-10-CM | POA: Diagnosis present

## 2020-07-30 DIAGNOSIS — Z79899 Other long term (current) drug therapy: Secondary | ICD-10-CM

## 2020-07-30 DIAGNOSIS — Z89411 Acquired absence of right great toe: Secondary | ICD-10-CM

## 2020-07-30 DIAGNOSIS — M1990 Unspecified osteoarthritis, unspecified site: Secondary | ICD-10-CM | POA: Diagnosis present

## 2020-07-30 DIAGNOSIS — A480 Gas gangrene: Secondary | ICD-10-CM | POA: Diagnosis present

## 2020-07-30 DIAGNOSIS — L03116 Cellulitis of left lower limb: Secondary | ICD-10-CM | POA: Diagnosis present

## 2020-07-30 DIAGNOSIS — R6520 Severe sepsis without septic shock: Secondary | ICD-10-CM | POA: Diagnosis present

## 2020-07-30 DIAGNOSIS — E11621 Type 2 diabetes mellitus with foot ulcer: Secondary | ICD-10-CM | POA: Diagnosis present

## 2020-07-30 DIAGNOSIS — G9341 Metabolic encephalopathy: Secondary | ICD-10-CM | POA: Diagnosis not present

## 2020-07-30 DIAGNOSIS — I248 Other forms of acute ischemic heart disease: Secondary | ICD-10-CM | POA: Diagnosis present

## 2020-07-30 DIAGNOSIS — E1142 Type 2 diabetes mellitus with diabetic polyneuropathy: Secondary | ICD-10-CM | POA: Diagnosis present

## 2020-07-30 DIAGNOSIS — R0902 Hypoxemia: Secondary | ICD-10-CM | POA: Diagnosis not present

## 2020-07-30 DIAGNOSIS — M726 Necrotizing fasciitis: Secondary | ICD-10-CM | POA: Diagnosis present

## 2020-07-30 DIAGNOSIS — J189 Pneumonia, unspecified organism: Secondary | ICD-10-CM | POA: Diagnosis present

## 2020-07-30 DIAGNOSIS — Z20822 Contact with and (suspected) exposure to covid-19: Secondary | ICD-10-CM | POA: Diagnosis present

## 2020-07-30 DIAGNOSIS — E876 Hypokalemia: Secondary | ICD-10-CM | POA: Diagnosis present

## 2020-07-30 DIAGNOSIS — E872 Acidosis: Secondary | ICD-10-CM | POA: Diagnosis present

## 2020-07-30 DIAGNOSIS — Z716 Tobacco abuse counseling: Secondary | ICD-10-CM

## 2020-07-30 DIAGNOSIS — E11628 Type 2 diabetes mellitus with other skin complications: Secondary | ICD-10-CM | POA: Diagnosis present

## 2020-07-30 DIAGNOSIS — Z833 Family history of diabetes mellitus: Secondary | ICD-10-CM

## 2020-07-30 DIAGNOSIS — Z885 Allergy status to narcotic agent status: Secondary | ICD-10-CM

## 2020-07-30 DIAGNOSIS — Z794 Long term (current) use of insulin: Secondary | ICD-10-CM

## 2020-07-30 DIAGNOSIS — M109 Gout, unspecified: Secondary | ICD-10-CM | POA: Diagnosis present

## 2020-07-30 DIAGNOSIS — L02612 Cutaneous abscess of left foot: Secondary | ICD-10-CM | POA: Diagnosis present

## 2020-07-30 DIAGNOSIS — N1832 Chronic kidney disease, stage 3b: Secondary | ICD-10-CM | POA: Diagnosis present

## 2020-07-30 DIAGNOSIS — E1152 Type 2 diabetes mellitus with diabetic peripheral angiopathy with gangrene: Secondary | ICD-10-CM | POA: Diagnosis present

## 2020-07-30 DIAGNOSIS — Z83438 Family history of other disorder of lipoprotein metabolism and other lipidemia: Secondary | ICD-10-CM

## 2020-07-30 DIAGNOSIS — I959 Hypotension, unspecified: Secondary | ICD-10-CM | POA: Diagnosis present

## 2020-07-30 LAB — URINALYSIS, ROUTINE W REFLEX MICROSCOPIC
Bilirubin Urine: NEGATIVE
Glucose, UA: 50 mg/dL — AB
Ketones, ur: NEGATIVE mg/dL
Leukocytes,Ua: NEGATIVE
Nitrite: NEGATIVE
Protein, ur: 100 mg/dL — AB
Specific Gravity, Urine: 1.02 (ref 1.005–1.030)
pH: 5 (ref 5.0–8.0)

## 2020-07-30 LAB — HEPATIC FUNCTION PANEL
ALT: 14 U/L (ref 0–44)
AST: 17 U/L (ref 15–41)
Albumin: 2.8 g/dL — ABNORMAL LOW (ref 3.5–5.0)
Alkaline Phosphatase: 101 U/L (ref 38–126)
Bilirubin, Direct: 0.2 mg/dL (ref 0.0–0.2)
Indirect Bilirubin: 0.6 mg/dL (ref 0.3–0.9)
Total Bilirubin: 0.8 mg/dL (ref 0.3–1.2)
Total Protein: 6.2 g/dL — ABNORMAL LOW (ref 6.5–8.1)

## 2020-07-30 LAB — TROPONIN I (HIGH SENSITIVITY): Troponin I (High Sensitivity): 19 ng/L — ABNORMAL HIGH (ref <18)

## 2020-07-30 LAB — BRAIN NATRIURETIC PEPTIDE: B Natriuretic Peptide: 275.8 pg/mL — ABNORMAL HIGH (ref 0.0–100.0)

## 2020-07-30 LAB — RESP PANEL BY RT-PCR (FLU A&B, COVID) ARPGX2
Influenza A by PCR: NEGATIVE
Influenza B by PCR: NEGATIVE
SARS Coronavirus 2 by RT PCR: NEGATIVE

## 2020-07-30 LAB — LACTIC ACID, PLASMA: Lactic Acid, Venous: 2.1 mmol/L (ref 0.5–1.9)

## 2020-07-30 MED ORDER — GADOBUTROL 1 MMOL/ML IV SOLN
10.0000 mL | Freq: Once | INTRAVENOUS | Status: AC | PRN
  Administered 2020-07-30: 10 mL via INTRAVENOUS

## 2020-07-30 MED ORDER — ACETAMINOPHEN 500 MG PO TABS
1000.0000 mg | ORAL_TABLET | Freq: Once | ORAL | Status: AC
  Administered 2020-07-30: 1000 mg via ORAL
  Filled 2020-07-30: qty 2

## 2020-07-30 MED ORDER — SODIUM CHLORIDE 0.9 % IV BOLUS
1000.0000 mL | Freq: Once | INTRAVENOUS | Status: AC
Start: 2020-07-30 — End: 2020-07-30
  Administered 2020-07-30: 1000 mL via INTRAVENOUS

## 2020-07-30 MED ORDER — GABAPENTIN 300 MG PO CAPS
300.0000 mg | ORAL_CAPSULE | Freq: Two times a day (BID) | ORAL | Status: DC
  Administered 2020-07-30: 300 mg via ORAL
  Filled 2020-07-30: qty 1

## 2020-07-30 NOTE — ED Notes (Signed)
Remains in MRI at this time 

## 2020-07-30 NOTE — ED Notes (Addendum)
Reviewed discharge instructions with patient. Follow-up care reviewed. Patient verbalized understanding. Patient A&Ox4, and ambulatory w/ steady gait and  VSS upon discharge.

## 2020-07-30 NOTE — ED Notes (Signed)
Pt refusing repeat lactic and trop d/t she wants to go home

## 2020-07-30 NOTE — ED Notes (Signed)
Pt transported to MRI 
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
## 2020-07-30 NOTE — ED Notes (Signed)
Noted that pt O2 sats were 88% RA. I asked pt if she has sleep apnea or wears O2 at home. Pt was verbally aggressive and said, "Come on!" I explained that it was a legit question to see if she needed to be on any oxygen. After pt yelled at this RN, pt O2 noted to be 96%. Pt noncompliant w/ monitoring equipment as well. Explained need to keep monitoring equipment on.

## 2020-07-30 NOTE — ED Notes (Signed)
MD at bedside. 

## 2020-07-30 NOTE — ED Notes (Signed)
Pt still unable to provide urine. IVF completed. PO fluids given.

## 2020-07-30 NOTE — Discharge Instructions (Signed)
Return for any worsening.  Follow up with your family doc.

## 2020-07-30 NOTE — ED Notes (Signed)
Returned from MRI 

## 2020-07-30 NOTE — ED Notes (Signed)
Critical lab LA 2.1 provider made aware

## 2020-07-30 NOTE — ED Notes (Signed)
Radiology at bedside

## 2020-07-30 NOTE — ED Notes (Signed)
purewick placed at this time 

## 2020-07-30 NOTE — ED Notes (Addendum)
Pt ambulated back to her room with steady gait. Pt cleaned, monitoring devices placed back, purewick replaced.

## 2020-07-30 NOTE — ED Notes (Signed)
Provider at bedside

## 2020-07-30 NOTE — ED Notes (Signed)
Pt requesting to leave now.  MD notified.

## 2020-07-30 NOTE — ED Provider Notes (Signed)
I received the patient in signout from Dr. Tami Ribas, briefly the patient is a 65 year old female who checked in with foot pain and back pain and a fever.  MRI of the T and L-spine were negative for epidural abscess osteomyelitis or discitis of the spine.  Thought to be unlikely to be bacteremia, no other specific infectious symptoms.  Denies urinary symptoms.  Plan to await for UA as this would impact whether or not to start her on antibiotics.  The patient was able to provide a urine sample but now is not willing to wait for the result to return.  I discussed with her the thought process behind this and the patient would prefer to go home at this time and plans to call her family doctor to have him check on her urine results.  Offered for her to return anytime she wished to be reevaluated.  Especially discussed return precautions about worsening symptoms.  Patient stated understanding.   Shirley Chen Plan, DO 07/30/20 1246

## 2020-07-30 NOTE — ED Notes (Signed)
Remains in MRI 

## 2020-07-31 LAB — URINE CULTURE: Culture: <10000 — AB

## 2020-08-01 ENCOUNTER — Inpatient Hospital Stay (HOSPITAL_COMMUNITY)
Admission: EM | Admit: 2020-08-01 | Discharge: 2020-08-11 | DRG: 853 | Disposition: A | Discharge status: Skilled Nursing Facility | Payer: Medicare Other | Attending: Internal Medicine | Admitting: Internal Medicine

## 2020-08-01 ENCOUNTER — Other Ambulatory Visit: Payer: Self-pay

## 2020-08-01 DIAGNOSIS — Z7982 Long term (current) use of aspirin: Secondary | ICD-10-CM

## 2020-08-01 DIAGNOSIS — A480 Gas gangrene: Secondary | ICD-10-CM | POA: Diagnosis not present

## 2020-08-01 DIAGNOSIS — Z888 Allergy status to other drugs, medicaments and biological substances status: Secondary | ICD-10-CM

## 2020-08-01 DIAGNOSIS — Z794 Long term (current) use of insulin: Secondary | ICD-10-CM

## 2020-08-01 DIAGNOSIS — L02612 Cutaneous abscess of left foot: Secondary | ICD-10-CM | POA: Diagnosis not present

## 2020-08-01 DIAGNOSIS — R0902 Hypoxemia: Secondary | ICD-10-CM

## 2020-08-01 DIAGNOSIS — Z716 Tobacco abuse counseling: Secondary | ICD-10-CM

## 2020-08-01 DIAGNOSIS — R0600 Dyspnea, unspecified: Secondary | ICD-10-CM | POA: Diagnosis not present

## 2020-08-01 DIAGNOSIS — E11628 Type 2 diabetes mellitus with other skin complications: Secondary | ICD-10-CM | POA: Diagnosis not present

## 2020-08-01 DIAGNOSIS — D631 Anemia in chronic kidney disease: Secondary | ICD-10-CM | POA: Diagnosis present

## 2020-08-01 DIAGNOSIS — J9601 Acute respiratory failure with hypoxia: Secondary | ICD-10-CM | POA: Diagnosis not present

## 2020-08-01 DIAGNOSIS — Z885 Allergy status to narcotic agent status: Secondary | ICD-10-CM

## 2020-08-01 DIAGNOSIS — I959 Hypotension, unspecified: Secondary | ICD-10-CM | POA: Diagnosis present

## 2020-08-01 DIAGNOSIS — Z20822 Contact with and (suspected) exposure to covid-19: Secondary | ICD-10-CM | POA: Diagnosis not present

## 2020-08-01 DIAGNOSIS — D649 Anemia, unspecified: Secondary | ICD-10-CM | POA: Diagnosis not present

## 2020-08-01 DIAGNOSIS — L97529 Non-pressure chronic ulcer of other part of left foot with unspecified severity: Secondary | ICD-10-CM | POA: Diagnosis present

## 2020-08-01 DIAGNOSIS — Z8249 Family history of ischemic heart disease and other diseases of the circulatory system: Secondary | ICD-10-CM

## 2020-08-01 DIAGNOSIS — G8929 Other chronic pain: Secondary | ICD-10-CM | POA: Diagnosis present

## 2020-08-01 DIAGNOSIS — E1122 Type 2 diabetes mellitus with diabetic chronic kidney disease: Secondary | ICD-10-CM | POA: Diagnosis present

## 2020-08-01 DIAGNOSIS — I152 Hypertension secondary to endocrine disorders: Secondary | ICD-10-CM | POA: Diagnosis present

## 2020-08-01 DIAGNOSIS — L03116 Cellulitis of left lower limb: Secondary | ICD-10-CM | POA: Diagnosis not present

## 2020-08-01 DIAGNOSIS — A419 Sepsis, unspecified organism: Secondary | ICD-10-CM | POA: Diagnosis not present

## 2020-08-01 DIAGNOSIS — R0602 Shortness of breath: Secondary | ICD-10-CM | POA: Diagnosis not present

## 2020-08-01 DIAGNOSIS — E1165 Type 2 diabetes mellitus with hyperglycemia: Secondary | ICD-10-CM | POA: Diagnosis present

## 2020-08-01 DIAGNOSIS — Z720 Tobacco use: Secondary | ICD-10-CM | POA: Diagnosis present

## 2020-08-01 DIAGNOSIS — N179 Acute kidney failure, unspecified: Secondary | ICD-10-CM

## 2020-08-01 DIAGNOSIS — Z89411 Acquired absence of right great toe: Secondary | ICD-10-CM

## 2020-08-01 DIAGNOSIS — I739 Peripheral vascular disease, unspecified: Secondary | ICD-10-CM | POA: Diagnosis present

## 2020-08-01 DIAGNOSIS — E785 Hyperlipidemia, unspecified: Secondary | ICD-10-CM | POA: Diagnosis present

## 2020-08-01 DIAGNOSIS — Z833 Family history of diabetes mellitus: Secondary | ICD-10-CM

## 2020-08-01 DIAGNOSIS — I5033 Acute on chronic diastolic (congestive) heart failure: Secondary | ICD-10-CM | POA: Diagnosis not present

## 2020-08-01 DIAGNOSIS — E872 Acidosis: Secondary | ICD-10-CM | POA: Diagnosis present

## 2020-08-01 DIAGNOSIS — R6520 Severe sepsis without septic shock: Secondary | ICD-10-CM | POA: Diagnosis present

## 2020-08-01 DIAGNOSIS — E870 Hyperosmolality and hypernatremia: Secondary | ICD-10-CM | POA: Diagnosis not present

## 2020-08-01 DIAGNOSIS — M109 Gout, unspecified: Secondary | ICD-10-CM | POA: Diagnosis present

## 2020-08-01 DIAGNOSIS — N1832 Chronic kidney disease, stage 3b: Secondary | ICD-10-CM | POA: Diagnosis not present

## 2020-08-01 DIAGNOSIS — Z83438 Family history of other disorder of lipoprotein metabolism and other lipidemia: Secondary | ICD-10-CM

## 2020-08-01 DIAGNOSIS — Z6841 Body Mass Index (BMI) 40.0 and over, adult: Secondary | ICD-10-CM

## 2020-08-01 DIAGNOSIS — I130 Hypertensive heart and chronic kidney disease with heart failure and stage 1 through stage 4 chronic kidney disease, or unspecified chronic kidney disease: Secondary | ICD-10-CM | POA: Diagnosis not present

## 2020-08-01 DIAGNOSIS — M726 Necrotizing fasciitis: Secondary | ICD-10-CM

## 2020-08-01 DIAGNOSIS — E1152 Type 2 diabetes mellitus with diabetic peripheral angiopathy with gangrene: Secondary | ICD-10-CM | POA: Diagnosis not present

## 2020-08-01 DIAGNOSIS — L089 Local infection of the skin and subcutaneous tissue, unspecified: Secondary | ICD-10-CM

## 2020-08-01 DIAGNOSIS — I248 Other forms of acute ischemic heart disease: Secondary | ICD-10-CM | POA: Diagnosis not present

## 2020-08-01 DIAGNOSIS — G9341 Metabolic encephalopathy: Secondary | ICD-10-CM | POA: Diagnosis not present

## 2020-08-01 DIAGNOSIS — E876 Hypokalemia: Secondary | ICD-10-CM | POA: Diagnosis present

## 2020-08-01 DIAGNOSIS — Z7984 Long term (current) use of oral hypoglycemic drugs: Secondary | ICD-10-CM

## 2020-08-01 DIAGNOSIS — E1159 Type 2 diabetes mellitus with other circulatory complications: Secondary | ICD-10-CM

## 2020-08-01 DIAGNOSIS — E1151 Type 2 diabetes mellitus with diabetic peripheral angiopathy without gangrene: Secondary | ICD-10-CM | POA: Diagnosis present

## 2020-08-01 DIAGNOSIS — M1990 Unspecified osteoarthritis, unspecified site: Secondary | ICD-10-CM | POA: Diagnosis present

## 2020-08-01 DIAGNOSIS — E1142 Type 2 diabetes mellitus with diabetic polyneuropathy: Secondary | ICD-10-CM | POA: Diagnosis present

## 2020-08-01 DIAGNOSIS — Z79899 Other long term (current) drug therapy: Secondary | ICD-10-CM

## 2020-08-01 DIAGNOSIS — F17210 Nicotine dependence, cigarettes, uncomplicated: Secondary | ICD-10-CM | POA: Diagnosis present

## 2020-08-01 DIAGNOSIS — E11621 Type 2 diabetes mellitus with foot ulcer: Secondary | ICD-10-CM | POA: Diagnosis present

## 2020-08-01 DIAGNOSIS — I10 Essential (primary) hypertension: Secondary | ICD-10-CM

## 2020-08-01 DIAGNOSIS — J189 Pneumonia, unspecified organism: Secondary | ICD-10-CM | POA: Diagnosis present

## 2020-08-01 NOTE — ED Triage Notes (Signed)
Pt arrives EMS from home with c/o nasal congestion, chills, and "cold symptoms". Pt hypotensive, and hypoxic in triage. Placed on 2L [place]. Seen 2 days ago at [facility] for foot swelling and fever.

## 2020-08-01 NOTE — ED Triage Notes (Signed)
Emergency Medicine Provider Triage Evaluation Note  Shirley Chen , a 65 y.o. female  was evaluated in triage.  Pt complains of sob.  Review of Systems  Positive: Sob, leg swelling Negative: Fever, cough, cp  Physical Exam  BP (!) 83/66 (BP Location: Right Arm)   Pulse 74   Temp 98.1 F (36.7 C) (Oral)   Resp 20   Ht 5\' 3"  (1.6 m)   Wt 108.9 kg   SpO2 (!) 89%   BMI 42.51 kg/m  Gen:   Drowsy, arousable HEENT:  Atraumatic  Resp:  tachypneic Cardiac:  Normal rate  Abd:   Nondistended, nontender MSK:   Moves extremities without difficulty, 2+ pitting edema to BLE Neuro:  Speech clear   Medical Decision Making  Medically screening exam initiated at 11:51 PM.  Appropriate orders placed.  Shirley Chen was informed that the remainder of the evaluation will be completed by another provider, this initial triage assessment does not replace that evaluation, and the importance of remaining in the ED until their evaluation is complete.  Clinical Impression  Sob x3-4 days, leg swelling, recently seen in the ER for leg swelling 2 days ago.  No hx of CHF.  Appears drowsy, hypoxic, peripheral edema, hypotensive.    Shirley [place], PA-C 08/01/20 2354

## 2020-08-02 ENCOUNTER — Inpatient Hospital Stay (HOSPITAL_COMMUNITY): Payer: Medicare Other

## 2020-08-02 ENCOUNTER — Emergency Department (HOSPITAL_COMMUNITY): Payer: Medicare Other

## 2020-08-02 ENCOUNTER — Other Ambulatory Visit: Payer: Self-pay

## 2020-08-02 ENCOUNTER — Encounter (HOSPITAL_COMMUNITY): Payer: Self-pay

## 2020-08-02 DIAGNOSIS — L03116 Cellulitis of left lower limb: Secondary | ICD-10-CM | POA: Diagnosis present

## 2020-08-02 DIAGNOSIS — Z20822 Contact with and (suspected) exposure to covid-19: Secondary | ICD-10-CM | POA: Diagnosis present

## 2020-08-02 DIAGNOSIS — R6520 Severe sepsis without septic shock: Secondary | ICD-10-CM | POA: Diagnosis present

## 2020-08-02 DIAGNOSIS — G9341 Metabolic encephalopathy: Secondary | ICD-10-CM | POA: Diagnosis not present

## 2020-08-02 DIAGNOSIS — I700 Atherosclerosis of aorta: Secondary | ICD-10-CM | POA: Diagnosis not present

## 2020-08-02 DIAGNOSIS — N179 Acute kidney failure, unspecified: Secondary | ICD-10-CM | POA: Diagnosis present

## 2020-08-02 DIAGNOSIS — Z9889 Other specified postprocedural states: Secondary | ICD-10-CM | POA: Diagnosis not present

## 2020-08-02 DIAGNOSIS — I129 Hypertensive chronic kidney disease with stage 1 through stage 4 chronic kidney disease, or unspecified chronic kidney disease: Secondary | ICD-10-CM | POA: Diagnosis present

## 2020-08-02 DIAGNOSIS — E1169 Type 2 diabetes mellitus with other specified complication: Secondary | ICD-10-CM | POA: Diagnosis not present

## 2020-08-02 DIAGNOSIS — Z89512 Acquired absence of left leg below knee: Secondary | ICD-10-CM | POA: Diagnosis not present

## 2020-08-02 DIAGNOSIS — R7989 Other specified abnormal findings of blood chemistry: Secondary | ICD-10-CM

## 2020-08-02 DIAGNOSIS — E785 Hyperlipidemia, unspecified: Secondary | ICD-10-CM | POA: Diagnosis present

## 2020-08-02 DIAGNOSIS — A4151 Sepsis due to Escherichia coli [E. coli]: Secondary | ICD-10-CM | POA: Diagnosis not present

## 2020-08-02 DIAGNOSIS — Z6841 Body Mass Index (BMI) 40.0 and over, adult: Secondary | ICD-10-CM | POA: Diagnosis not present

## 2020-08-02 DIAGNOSIS — J432 Centrilobular emphysema: Secondary | ICD-10-CM | POA: Diagnosis not present

## 2020-08-02 DIAGNOSIS — Z79899 Other long term (current) drug therapy: Secondary | ICD-10-CM | POA: Diagnosis not present

## 2020-08-02 DIAGNOSIS — I152 Hypertension secondary to endocrine disorders: Secondary | ICD-10-CM | POA: Diagnosis not present

## 2020-08-02 DIAGNOSIS — E870 Hyperosmolality and hypernatremia: Secondary | ICD-10-CM

## 2020-08-02 DIAGNOSIS — E1159 Type 2 diabetes mellitus with other circulatory complications: Secondary | ICD-10-CM | POA: Diagnosis not present

## 2020-08-02 DIAGNOSIS — R531 Weakness: Secondary | ICD-10-CM | POA: Diagnosis not present

## 2020-08-02 DIAGNOSIS — E11628 Type 2 diabetes mellitus with other skin complications: Secondary | ICD-10-CM | POA: Diagnosis not present

## 2020-08-02 DIAGNOSIS — E1151 Type 2 diabetes mellitus with diabetic peripheral angiopathy without gangrene: Secondary | ICD-10-CM | POA: Diagnosis not present

## 2020-08-02 DIAGNOSIS — Z83438 Family history of other disorder of lipoprotein metabolism and other lipidemia: Secondary | ICD-10-CM | POA: Diagnosis not present

## 2020-08-02 DIAGNOSIS — D508 Other iron deficiency anemias: Secondary | ICD-10-CM | POA: Diagnosis not present

## 2020-08-02 DIAGNOSIS — A480 Gas gangrene: Secondary | ICD-10-CM | POA: Diagnosis present

## 2020-08-02 DIAGNOSIS — Z23 Encounter for immunization: Secondary | ICD-10-CM | POA: Diagnosis not present

## 2020-08-02 DIAGNOSIS — Z8249 Family history of ischemic heart disease and other diseases of the circulatory system: Secondary | ICD-10-CM | POA: Diagnosis not present

## 2020-08-02 DIAGNOSIS — I739 Peripheral vascular disease, unspecified: Secondary | ICD-10-CM | POA: Diagnosis not present

## 2020-08-02 DIAGNOSIS — I5030 Unspecified diastolic (congestive) heart failure: Secondary | ICD-10-CM | POA: Diagnosis not present

## 2020-08-02 DIAGNOSIS — E1122 Type 2 diabetes mellitus with diabetic chronic kidney disease: Secondary | ICD-10-CM | POA: Diagnosis present

## 2020-08-02 DIAGNOSIS — N12 Tubulo-interstitial nephritis, not specified as acute or chronic: Secondary | ICD-10-CM | POA: Diagnosis not present

## 2020-08-02 DIAGNOSIS — A419 Sepsis, unspecified organism: Secondary | ICD-10-CM | POA: Diagnosis present

## 2020-08-02 DIAGNOSIS — I5033 Acute on chronic diastolic (congestive) heart failure: Secondary | ICD-10-CM | POA: Diagnosis not present

## 2020-08-02 DIAGNOSIS — M109 Gout, unspecified: Secondary | ICD-10-CM | POA: Diagnosis present

## 2020-08-02 DIAGNOSIS — B9620 Unspecified Escherichia coli [E. coli] as the cause of diseases classified elsewhere: Secondary | ICD-10-CM | POA: Diagnosis present

## 2020-08-02 DIAGNOSIS — B9689 Other specified bacterial agents as the cause of diseases classified elsewhere: Secondary | ICD-10-CM | POA: Diagnosis present

## 2020-08-02 DIAGNOSIS — R778 Other specified abnormalities of plasma proteins: Secondary | ICD-10-CM | POA: Diagnosis not present

## 2020-08-02 DIAGNOSIS — I959 Hypotension, unspecified: Secondary | ICD-10-CM

## 2020-08-02 DIAGNOSIS — Z8616 Personal history of COVID-19: Secondary | ICD-10-CM | POA: Diagnosis not present

## 2020-08-02 DIAGNOSIS — R0902 Hypoxemia: Secondary | ICD-10-CM | POA: Diagnosis present

## 2020-08-02 DIAGNOSIS — I151 Hypertension secondary to other renal disorders: Secondary | ICD-10-CM | POA: Diagnosis not present

## 2020-08-02 DIAGNOSIS — Z89411 Acquired absence of right great toe: Secondary | ICD-10-CM | POA: Diagnosis not present

## 2020-08-02 DIAGNOSIS — N1832 Chronic kidney disease, stage 3b: Secondary | ICD-10-CM | POA: Diagnosis present

## 2020-08-02 DIAGNOSIS — R7881 Bacteremia: Secondary | ICD-10-CM | POA: Diagnosis not present

## 2020-08-02 DIAGNOSIS — E875 Hyperkalemia: Secondary | ICD-10-CM | POA: Diagnosis present

## 2020-08-02 DIAGNOSIS — E1152 Type 2 diabetes mellitus with diabetic peripheral angiopathy with gangrene: Secondary | ICD-10-CM | POA: Diagnosis present

## 2020-08-02 DIAGNOSIS — L02612 Cutaneous abscess of left foot: Secondary | ICD-10-CM | POA: Diagnosis present

## 2020-08-02 DIAGNOSIS — J9601 Acute respiratory failure with hypoxia: Secondary | ICD-10-CM | POA: Diagnosis present

## 2020-08-02 DIAGNOSIS — J189 Pneumonia, unspecified organism: Secondary | ICD-10-CM | POA: Diagnosis present

## 2020-08-02 DIAGNOSIS — M726 Necrotizing fasciitis: Secondary | ICD-10-CM | POA: Diagnosis present

## 2020-08-02 DIAGNOSIS — R6510 Systemic inflammatory response syndrome (SIRS) of non-infectious origin without acute organ dysfunction: Secondary | ICD-10-CM

## 2020-08-02 DIAGNOSIS — U071 COVID-19: Secondary | ICD-10-CM | POA: Diagnosis present

## 2020-08-02 DIAGNOSIS — L089 Local infection of the skin and subcutaneous tissue, unspecified: Secondary | ICD-10-CM | POA: Diagnosis not present

## 2020-08-02 DIAGNOSIS — J441 Chronic obstructive pulmonary disease with (acute) exacerbation: Secondary | ICD-10-CM | POA: Diagnosis not present

## 2020-08-02 DIAGNOSIS — Z833 Family history of diabetes mellitus: Secondary | ICD-10-CM | POA: Diagnosis not present

## 2020-08-02 DIAGNOSIS — M5431 Sciatica, right side: Secondary | ICD-10-CM | POA: Diagnosis present

## 2020-08-02 DIAGNOSIS — R0609 Other forms of dyspnea: Secondary | ICD-10-CM | POA: Diagnosis not present

## 2020-08-02 DIAGNOSIS — E860 Dehydration: Secondary | ICD-10-CM | POA: Diagnosis present

## 2020-08-02 DIAGNOSIS — N1330 Unspecified hydronephrosis: Secondary | ICD-10-CM | POA: Diagnosis not present

## 2020-08-02 DIAGNOSIS — D649 Anemia, unspecified: Secondary | ICD-10-CM | POA: Diagnosis not present

## 2020-08-02 DIAGNOSIS — I10 Essential (primary) hypertension: Secondary | ICD-10-CM | POA: Diagnosis not present

## 2020-08-02 DIAGNOSIS — J101 Influenza due to other identified influenza virus with other respiratory manifestations: Secondary | ICD-10-CM | POA: Diagnosis not present

## 2020-08-02 DIAGNOSIS — N1831 Chronic kidney disease, stage 3a: Secondary | ICD-10-CM | POA: Diagnosis not present

## 2020-08-02 DIAGNOSIS — Z0000 Encounter for general adult medical examination without abnormal findings: Secondary | ICD-10-CM | POA: Diagnosis not present

## 2020-08-02 DIAGNOSIS — Z720 Tobacco use: Secondary | ICD-10-CM

## 2020-08-02 DIAGNOSIS — I130 Hypertensive heart and chronic kidney disease with heart failure and stage 1 through stage 4 chronic kidney disease, or unspecified chronic kidney disease: Secondary | ICD-10-CM | POA: Diagnosis present

## 2020-08-02 DIAGNOSIS — E872 Acidosis: Secondary | ICD-10-CM | POA: Diagnosis present

## 2020-08-02 DIAGNOSIS — E876 Hypokalemia: Secondary | ICD-10-CM | POA: Diagnosis present

## 2020-08-02 DIAGNOSIS — D72825 Bandemia: Secondary | ICD-10-CM

## 2020-08-02 DIAGNOSIS — I248 Other forms of acute ischemic heart disease: Secondary | ICD-10-CM | POA: Diagnosis present

## 2020-08-02 DIAGNOSIS — Z87891 Personal history of nicotine dependence: Secondary | ICD-10-CM | POA: Diagnosis not present

## 2020-08-02 DIAGNOSIS — D631 Anemia in chronic kidney disease: Secondary | ICD-10-CM | POA: Diagnosis present

## 2020-08-02 DIAGNOSIS — Z794 Long term (current) use of insulin: Secondary | ICD-10-CM | POA: Diagnosis not present

## 2020-08-02 LAB — COMPREHENSIVE METABOLIC PANEL
ALT: 19 U/L (ref 0–44)
AST: 17 U/L (ref 15–41)
Albumin: 2.7 g/dL — ABNORMAL LOW (ref 3.5–5.0)
Alkaline Phosphatase: 135 U/L — ABNORMAL HIGH (ref 38–126)
Anion gap: 12 (ref 5–15)
BUN: 46 mg/dL — ABNORMAL HIGH (ref 8–23)
CO2: 21 mmol/L — ABNORMAL LOW (ref 22–32)
Calcium: 8.6 mg/dL — ABNORMAL LOW (ref 8.9–10.3)
Chloride: 114 mmol/L — ABNORMAL HIGH (ref 98–111)
Creatinine, Ser: 2.27 mg/dL — ABNORMAL HIGH (ref 0.44–1.00)
GFR, Estimated: 24 mL/min — ABNORMAL LOW (ref >60)
Glucose, Bld: 223 mg/dL — ABNORMAL HIGH (ref 70–99)
Potassium: 4.2 mmol/L (ref 3.5–5.1)
Sodium: 147 mmol/L — ABNORMAL HIGH (ref 135–145)
Total Bilirubin: 1.4 mg/dL — ABNORMAL HIGH (ref 0.3–1.2)
Total Protein: 6.1 g/dL — ABNORMAL LOW (ref 6.5–8.1)

## 2020-08-02 LAB — BASIC METABOLIC PANEL
Anion gap: 12 (ref 5–15)
BUN: 46 mg/dL — ABNORMAL HIGH (ref 8–23)
CO2: 21 mmol/L — ABNORMAL LOW (ref 22–32)
Calcium: 8.6 mg/dL — ABNORMAL LOW (ref 8.9–10.3)
Chloride: 109 mmol/L (ref 98–111)
Creatinine, Ser: 2.13 mg/dL — ABNORMAL HIGH (ref 0.44–1.00)
GFR, Estimated: 26 mL/min — ABNORMAL LOW (ref >60)
Glucose, Bld: 230 mg/dL — ABNORMAL HIGH (ref 70–99)
Potassium: 3.9 mmol/L (ref 3.5–5.1)
Sodium: 142 mmol/L (ref 135–145)

## 2020-08-02 LAB — URINALYSIS, ROUTINE W REFLEX MICROSCOPIC
Bilirubin Urine: NEGATIVE
Glucose, UA: 50 mg/dL — AB
Hgb urine dipstick: NEGATIVE
Ketones, ur: NEGATIVE mg/dL
Leukocytes,Ua: NEGATIVE
Nitrite: POSITIVE — AB
Protein, ur: >=300 mg/dL — AB
Specific Gravity, Urine: 1.02 (ref 1.005–1.030)
pH: 5 (ref 5.0–8.0)

## 2020-08-02 LAB — CBC WITH DIFFERENTIAL/PLATELET
Abs Immature Granulocytes: 0.38 10*3/uL — ABNORMAL HIGH (ref 0.00–0.07)
Abs Immature Granulocytes: 0.67 10*3/uL — ABNORMAL HIGH (ref 0.00–0.07)
Basophils Absolute: 0 10*3/uL (ref 0.0–0.1)
Basophils Absolute: 0 10*3/uL (ref 0.0–0.1)
Basophils Relative: 0 %
Basophils Relative: 0 %
Eosinophils Absolute: 0 10*3/uL (ref 0.0–0.5)
Eosinophils Absolute: 0 10*3/uL (ref 0.0–0.5)
Eosinophils Relative: 0 %
Eosinophils Relative: 0 %
HCT: 26.6 % — ABNORMAL LOW (ref 36.0–46.0)
HCT: 27.7 % — ABNORMAL LOW (ref 36.0–46.0)
Hemoglobin: 8.2 g/dL — ABNORMAL LOW (ref 12.0–15.0)
Hemoglobin: 8.4 g/dL — ABNORMAL LOW (ref 12.0–15.0)
Immature Granulocytes: 2 %
Immature Granulocytes: 3 %
Lymphocytes Relative: 4 %
Lymphocytes Relative: 5 %
Lymphs Abs: 1 10*3/uL (ref 0.7–4.0)
Lymphs Abs: 1.1 10*3/uL (ref 0.7–4.0)
MCH: 29.6 pg (ref 26.0–34.0)
MCH: 29.5 pg (ref 26.0–34.0)
MCHC: 30.8 g/dL (ref 30.0–36.0)
MCHC: 30.3 g/dL (ref 30.0–36.0)
MCV: 96 fL (ref 80.0–100.0)
MCV: 97.2 fL (ref 80.0–100.0)
Monocytes Absolute: 1.8 10*3/uL — ABNORMAL HIGH (ref 0.1–1.0)
Monocytes Absolute: 1.7 10*3/uL — ABNORMAL HIGH (ref 0.1–1.0)
Monocytes Relative: 7 %
Monocytes Relative: 7 %
Neutro Abs: 21.7 10*3/uL — ABNORMAL HIGH (ref 1.7–7.7)
Neutro Abs: 21.6 10*3/uL — ABNORMAL HIGH (ref 1.7–7.7)
Neutrophils Relative %: 87 %
Neutrophils Relative %: 85 %
Platelets: 311 10*3/uL (ref 150–400)
Platelets: 289 10*3/uL (ref 150–400)
RBC: 2.77 MIL/uL — ABNORMAL LOW (ref 3.87–5.11)
RBC: 2.85 MIL/uL — ABNORMAL LOW (ref 3.87–5.11)
RDW: 14.1 % (ref 11.5–15.5)
RDW: 14.1 % (ref 11.5–15.5)
WBC: 24.9 10*3/uL — ABNORMAL HIGH (ref 4.0–10.5)
WBC: 25.1 10*3/uL — ABNORMAL HIGH (ref 4.0–10.5)
nRBC: 0 % (ref 0.0–0.2)
nRBC: 0 % (ref 0.0–0.2)

## 2020-08-02 LAB — MRSA PCR SCREENING: MRSA by PCR: NEGATIVE

## 2020-08-02 LAB — URIC ACID: Uric Acid, Serum: 9.4 mg/dL — ABNORMAL HIGH (ref 2.5–7.1)

## 2020-08-02 LAB — LACTIC ACID, PLASMA
Lactic Acid, Venous: 1.3 mmol/L (ref 0.5–1.9)
Lactic Acid, Venous: 0.9 mmol/L (ref 0.5–1.9)

## 2020-08-02 LAB — RESP PANEL BY RT-PCR (FLU A&B, COVID) ARPGX2
Influenza A by PCR: NEGATIVE
Influenza B by PCR: NEGATIVE
SARS Coronavirus 2 by RT PCR: NEGATIVE

## 2020-08-02 LAB — HEPARIN LEVEL (UNFRACTIONATED): Heparin Unfractionated: <0.1 IU/mL — ABNORMAL LOW (ref 0.30–0.70)

## 2020-08-02 LAB — D-DIMER, QUANTITATIVE: D-Dimer, Quant: 5.56 ug/mL-FEU — ABNORMAL HIGH (ref 0.00–0.50)

## 2020-08-02 LAB — PROTEIN / CREATININE RATIO, URINE
Creatinine, Urine: 210.46 mg/dL
Protein Creatinine Ratio: 0.49 mg/mg{Cre} — ABNORMAL HIGH (ref 0.00–0.15)
Total Protein, Urine: 104.17 mg/dL

## 2020-08-02 LAB — PROTIME-INR
INR: 1.1 (ref 0.8–1.2)
INR: 1.1 (ref 0.8–1.2)
Prothrombin Time: 14.5 seconds (ref 11.4–15.2)
Prothrombin Time: 14.3 seconds (ref 11.4–15.2)

## 2020-08-02 LAB — HIV ANTIBODY (ROUTINE TESTING W REFLEX): HIV Screen 4th Generation wRfx: NONREACTIVE

## 2020-08-02 LAB — SODIUM, URINE, RANDOM: Sodium, Ur: 22 mmol/L

## 2020-08-02 LAB — APTT
aPTT: 33 seconds (ref 24–36)
aPTT: 43 seconds — ABNORMAL HIGH (ref 24–36)

## 2020-08-02 LAB — TROPONIN I (HIGH SENSITIVITY)
Troponin I (High Sensitivity): 118 ng/L (ref <18)
Troponin I (High Sensitivity): 107 ng/L (ref <18)

## 2020-08-02 LAB — GLUCOSE, CAPILLARY
Glucose-Capillary: 193 mg/dL — ABNORMAL HIGH (ref 70–99)
Glucose-Capillary: 189 mg/dL — ABNORMAL HIGH (ref 70–99)
Glucose-Capillary: 181 mg/dL — ABNORMAL HIGH (ref 70–99)
Glucose-Capillary: 160 mg/dL — ABNORMAL HIGH (ref 70–99)

## 2020-08-02 LAB — BRAIN NATRIURETIC PEPTIDE
B Natriuretic Peptide: 383.6 pg/mL — ABNORMAL HIGH (ref 0.0–100.0)
B Natriuretic Peptide: 440.3 pg/mL — ABNORMAL HIGH (ref 0.0–100.0)

## 2020-08-02 LAB — PROCALCITONIN: Procalcitonin: 0.36 ng/mL

## 2020-08-02 LAB — CREATININE, URINE, RANDOM: Creatinine, Urine: 211.92 mg/dL

## 2020-08-02 MED ORDER — HEPARIN BOLUS VIA INFUSION
2300.0000 [IU] | Freq: Once | INTRAVENOUS | Status: AC
  Administered 2020-08-02: 2300 [IU] via INTRAVENOUS
  Filled 2020-08-02: qty 2300

## 2020-08-02 MED ORDER — METRONIDAZOLE IN NACL 5-0.79 MG/ML-% IV SOLN
500.0000 mg | Freq: Three times a day (TID) | INTRAVENOUS | Status: DC
  Administered 2020-08-02: 500 mg via INTRAVENOUS
  Filled 2020-08-02: qty 100

## 2020-08-02 MED ORDER — VANCOMYCIN HCL IN DEXTROSE 1-5 GM/200ML-% IV SOLN: 1000.0000 mg | Freq: Once | INTRAVENOUS | Status: DC

## 2020-08-02 MED ORDER — SODIUM CHLORIDE 0.45 % IV SOLN: INTRAVENOUS | Status: DC

## 2020-08-02 MED ORDER — HEPARIN (PORCINE) 25000 UT/250ML-% IV SOLN
1400.0000 [IU]/h | INTRAVENOUS | Status: DC
  Administered 2020-08-02: 1400 [IU]/h via INTRAVENOUS
  Filled 2020-08-02: qty 250

## 2020-08-02 MED ORDER — GABAPENTIN 300 MG PO CAPS
300.0000 mg | ORAL_CAPSULE | Freq: Two times a day (BID) | ORAL | Status: DC
  Administered 2020-08-02 – 2020-08-11 (×17): 300 mg via ORAL
  Filled 2020-08-02 (×17): qty 1

## 2020-08-02 MED ORDER — CARVEDILOL 25 MG PO TABS
25.0000 mg | ORAL_TABLET | Freq: Two times a day (BID) | ORAL | Status: DC
  Administered 2020-08-02 – 2020-08-11 (×17): 25 mg via ORAL
  Filled 2020-08-02 (×2): qty 2
  Filled 2020-08-02 (×2): qty 1
  Filled 2020-08-02: qty 2
  Filled 2020-08-02 (×4): qty 1
  Filled 2020-08-02: qty 2
  Filled 2020-08-02 (×7): qty 1

## 2020-08-02 MED ORDER — SODIUM CHLORIDE 0.9 % IV SOLN: 2.0000 g | Freq: Once | INTRAVENOUS | Status: DC

## 2020-08-02 MED ORDER — SODIUM CHLORIDE 0.9 % IV SOLN
2.0000 g | Freq: Once | INTRAVENOUS | Status: AC
  Administered 2020-08-02: 2 g via INTRAVENOUS
  Filled 2020-08-02: qty 2

## 2020-08-02 MED ORDER — SODIUM CHLORIDE 0.9 % IV BOLUS
500.0000 mL | Freq: Once | INTRAVENOUS | Status: AC
  Administered 2020-08-02: 500 mL via INTRAVENOUS

## 2020-08-02 MED ORDER — VANCOMYCIN HCL 1250 MG/250ML IV SOLN: 1250.0000 mg | INTRAVENOUS | Status: DC

## 2020-08-02 MED ORDER — HEPARIN SODIUM (PORCINE) 5000 UNIT/ML IJ SOLN
5000.0000 [IU] | Freq: Three times a day (TID) | INTRAMUSCULAR | Status: DC
  Administered 2020-08-02 – 2020-08-11 (×27): 5000 [IU] via SUBCUTANEOUS
  Filled 2020-08-02 (×27): qty 1

## 2020-08-02 MED ORDER — EZETIMIBE 10 MG PO TABS
10.0000 mg | ORAL_TABLET | Freq: Every day | ORAL | Status: DC
  Administered 2020-08-02 – 2020-08-11 (×10): 10 mg via ORAL
  Filled 2020-08-02 (×11): qty 1

## 2020-08-02 MED ORDER — OXYCODONE HCL 5 MG PO TABS
5.0000 mg | ORAL_TABLET | Freq: Two times a day (BID) | ORAL | Status: DC | PRN
  Administered 2020-08-02 – 2020-08-04 (×5): 5 mg via ORAL
  Filled 2020-08-02 (×5): qty 1

## 2020-08-02 MED ORDER — HEPARIN (PORCINE) 25000 UT/250ML-% IV SOLN
1650.0000 [IU]/h | INTRAVENOUS | Status: DC
  Administered 2020-08-02: 1650 [IU]/h via INTRAVENOUS
  Filled 2020-08-02: qty 250

## 2020-08-02 MED ORDER — METRONIDAZOLE 500 MG/100ML IV SOLN
500.0000 mg | Freq: Three times a day (TID) | INTRAVENOUS | Status: DC
  Administered 2020-08-02 – 2020-08-08 (×17): 500 mg via INTRAVENOUS
  Filled 2020-08-02 (×18): qty 100

## 2020-08-02 MED ORDER — LACTATED RINGERS IV SOLN: INTRAVENOUS | Status: DC

## 2020-08-02 MED ORDER — ATORVASTATIN CALCIUM 40 MG PO TABS
40.0000 mg | ORAL_TABLET | Freq: Every day | ORAL | Status: DC
  Administered 2020-08-02 – 2020-08-04 (×3): 40 mg via ORAL
  Filled 2020-08-02 (×3): qty 1

## 2020-08-02 MED ORDER — ACETAMINOPHEN 650 MG RE SUPP: 650.0000 mg | Freq: Four times a day (QID) | RECTAL | Status: DC | PRN

## 2020-08-02 MED ORDER — INSULIN ASPART 100 UNIT/ML IJ SOLN
0.0000 [IU] | Freq: Every day | INTRAMUSCULAR | Status: DC
  Administered 2020-08-06 – 2020-08-07 (×2): 3 [IU] via SUBCUTANEOUS
  Administered 2020-08-08: 4 [IU] via SUBCUTANEOUS
  Administered 2020-08-09 – 2020-08-10 (×2): 3 [IU] via SUBCUTANEOUS

## 2020-08-02 MED ORDER — VANCOMYCIN HCL 2000 MG/400ML IV SOLN
2000.0000 mg | INTRAVENOUS | Status: AC
  Administered 2020-08-02: 2000 mg via INTRAVENOUS
  Filled 2020-08-02: qty 400

## 2020-08-02 MED ORDER — SODIUM CHLORIDE 0.9 % IV SOLN
2.0000 g | Freq: Two times a day (BID) | INTRAVENOUS | Status: DC
  Administered 2020-08-02 – 2020-08-08 (×12): 2 g via INTRAVENOUS
  Filled 2020-08-02 (×12): qty 2

## 2020-08-02 MED ORDER — INSULIN ASPART 100 UNIT/ML IJ SOLN
0.0000 [IU] | Freq: Three times a day (TID) | INTRAMUSCULAR | Status: DC
  Administered 2020-08-02 – 2020-08-03 (×2): 3 [IU] via SUBCUTANEOUS
  Administered 2020-08-03 – 2020-08-04 (×3): 2 [IU] via SUBCUTANEOUS
  Administered 2020-08-05 – 2020-08-06 (×5): 3 [IU] via SUBCUTANEOUS
  Administered 2020-08-07: 8 [IU] via SUBCUTANEOUS
  Administered 2020-08-07 (×2): 5 [IU] via SUBCUTANEOUS
  Administered 2020-08-08: 11 [IU] via SUBCUTANEOUS
  Administered 2020-08-08: 5 [IU] via SUBCUTANEOUS
  Administered 2020-08-09: 15 [IU] via SUBCUTANEOUS
  Administered 2020-08-09: 8 [IU] via SUBCUTANEOUS
  Administered 2020-08-09 – 2020-08-10 (×4): 5 [IU] via SUBCUTANEOUS
  Administered 2020-08-11: 8 [IU] via SUBCUTANEOUS
  Administered 2020-08-11: 11 [IU] via SUBCUTANEOUS

## 2020-08-02 MED ORDER — INSULIN ASPART 100 UNIT/ML ~~LOC~~ SOLN
0.0000 [IU] | Freq: Three times a day (TID) | SUBCUTANEOUS | Status: DC
  Administered 2020-08-02: 1 [IU] via SUBCUTANEOUS
  Filled 2020-08-02: qty 0.09

## 2020-08-02 MED ORDER — TRAMADOL HCL 50 MG PO TABS
50.0000 mg | ORAL_TABLET | Freq: Two times a day (BID) | ORAL | Status: DC | PRN
  Administered 2020-08-04 – 2020-08-11 (×6): 50 mg via ORAL
  Filled 2020-08-02 (×7): qty 1

## 2020-08-02 MED ORDER — METRONIDAZOLE IN NACL 5-0.79 MG/ML-% IV SOLN
500.0000 mg | Freq: Once | INTRAVENOUS | Status: AC
  Administered 2020-08-02: 500 mg via INTRAVENOUS
  Filled 2020-08-02: qty 100

## 2020-08-02 MED ORDER — ACETAMINOPHEN 325 MG PO TABS
650.0000 mg | ORAL_TABLET | Freq: Four times a day (QID) | ORAL | Status: DC | PRN
  Administered 2020-08-02 – 2020-08-04 (×2): 650 mg via ORAL
  Filled 2020-08-02 (×2): qty 2

## 2020-08-02 MED ORDER — HEPARIN BOLUS VIA INFUSION
3000.0000 [IU] | Freq: Once | INTRAVENOUS | Status: AC
  Administered 2020-08-02: 3000 [IU] via INTRAVENOUS
  Filled 2020-08-02: qty 3000
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
## 2020-08-02 NOTE — Sepsis Progress Note (Signed)
Following for sepsis monitoring ?

## 2020-08-02 NOTE — Sepsis Progress Note (Signed)
Cautious resuscitation d/t physical signs of fluid overload. patient exhibiting lower extremity edema
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
## 2020-08-02 NOTE — ED Notes (Signed)
2 sets of blood cultures sent to lab.

## 2020-08-02 NOTE — Plan of Care (Signed)

## 2020-08-02 NOTE — Progress Notes (Signed)
ANTICOAGULATION CONSULT NOTE - Initial Consult  Pharmacy Consult for heparin Indication: r/o pulmonary embolism  Allergies  Allergen Reactions  . Losartan Nausea And Vomiting  . Morphine And Related     Pt states " I just got really sick"    Patient Measurements: Height: 5\' 3"  (160 cm) Weight: 108.9 kg (240 lb) IBW/kg (Calculated) : 52.4 Heparin Dosing Weight: 78 kg  Vital Signs: Temp: 99.9 F (37.7 C) (04/28 0100) Temp Source: Rectal (04/28 0100) BP: 120/106 (04/28 0130) Pulse Rate: 74 (04/28 0130)  Labs: Recent Labs    07/30/20 0255 08/01/20 0041  CREATININE  --  2.13*  TROPONINIHS 19*  --     Estimated Creatinine Clearance: 32 mL/min (A) (by C-G formula based on SCr of 2.13 mg/dL (H)).   Medical History: Past Medical History:  Diagnosis Date  . Arthritis    "joints in hands and right leg ache" (10/20/2014)  . Cellulitis of right foot 10/2013  . Constipation   . Diabetes mellitus without complication (HCC)    Type II  . Headache    "maybe weekly" (10/20/2014)  . Hyperlipidemia   . Hypertension   . Tobacco abuse   . UTI (lower urinary tract infection)     Medications:  No oral anticoagulation PTA  Assessment:  65 yr female presents with shortness of breath.  Evaluated recently for leg swelling  V/Q scan pending  Pharmacy consulted to dose IV heparin for suspected PE  Goal of Therapy:  Heparin level 0.3-0.7 units/ml Monitor platelets by anticoagulation protocol: Yes   Plan:  - Heparin 3000 unit IV bolus x 1 followed by heparin gtt @ 1400 units/hr - Check Heparin level 8 hr after heparin gtt started - Daily heparin level and CBC - Monitor for signs & symptoms of bleeding  Gisele Pack, 65, PharmD 08/02/2020,1:44 AM
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
## 2020-08-02 NOTE — Progress Notes (Signed)
Pharmacy Antibiotic Note  Shirley Rubiano is a 65 y.o. female admitted on 08/01/2020 with sepsis.  Patient received Vancomycin 2gm IV, Metronidazole 500mg  IV and Cefepime 2gm IV x 1 in the ED Pharmacy has been consulted for Vancomycin and Cefepime dosing.  Plan:  Vancomycin 1250 mg IV Q 48 hrs. Goal AUC 400-550.  Expected AUC: 499.9  SCr used: 2.13  Cefepime 2gm IV q12h  Metronidazole per MD  F/u renal function, culture results/sensitivities   Height: 5\' 3"  (160 cm) Weight: 108.9 kg (240 lb) IBW/kg (Calculated) : 52.4  Temp (24hrs), Avg:99 F (37.2 C), Min:98.1 F (36.7 C), Max:99.9 F (37.7 C)  Recent Labs  Lab 07/29/20 2255 07/30/20 0255 08/01/20 0041 08/02/20 0041 08/02/20 0230  WBC 24.8*  --   --  24.9*  --   CREATININE 1.54*  --  2.13*  --   --   LATICACIDVEN  --  2.1* 1.3  --  0.9    Estimated Creatinine Clearance: 32 mL/min (A) (by C-G formula based on SCr of 2.13 mg/dL (H)).    Allergies  Allergen Reactions  . Losartan Nausea And Vomiting  . Morphine And Related     Pt states " I just got really sick"    Antimicrobials this admission: 4/28 Cefepime >>   4/28 Vancomycin >>   4/28 Metronidazole >>  Dose adjustments this admission:    Microbiology results: 4/28 BCx:    Thank you for allowing pharmacy to be a part of this patient's care.  5/28, PharmD 08/02/2020 5:08 AM

## 2020-08-02 NOTE — Progress Notes (Signed)
A consult was received from an ED physician for Vancomycin and Cefepime per pharmacy dosing.  The patient's profile has been reviewed for ht/wt/allergies/indication/available labs.    A one time order has been placed for Vancomycin 2gm and Cefepime 2gm IV.    Further antibiotics/pharmacy consults should be ordered by admitting physician if indicated.                       Thank you, Maryellen Pile, PharmD 08/02/2020  2:19 AM

## 2020-08-02 NOTE — Progress Notes (Signed)
PROGRESS NOTE  Shirley Chen MVH:[id number] DOB: 12/23/1955   PCP: Olive Bass, FNP  Patient is from: Home  DOA: 08/01/2020 LOS: 0  Chief complaints: Shortness of breath  Brief Narrative / Interim history: 65 year old F with PMH of DM-2, HTN, CKD-3, PVD, HLD and tobacco abuse presenting with progressive shortness of breath, lower extremity edema and nausea and admitted with SIRS, acute respiratory failure with hypoxia and elevated D-dimer.  She was empirically started on broad-spectrum antibiotics and IV heparin pending cultures, VQ scan and LE [place].  CT chest without contrast without acute finding.  She also had mildly elevated troponin and BNP.  Subjective: Seen and examined earlier this morning.  Reports some improvement in her breathing.  She denies chest pain.  She reports chronic 3 pillow orthopnea unchanged from baseline.  She continues to feel nauseous but no emesis.  She denies abdominal pain or UTI symptoms.  Continues to smoke about half a pack a day  Objective: Vitals:   08/02/20 0623 08/02/20 0656 08/02/20 0706 08/02/20 1008  BP: 139/79 136/62    Pulse: 76 75 73   Resp: (!) 28     Temp: 99.7 F (37.6 C) 98.7 F (37.1 C)    TempSrc: Oral Oral    SpO2: 97% (!) 88% 91% 94%  Weight:      Height:        Intake/Output Summary (Last 24 hours) at 08/02/2020 1121 Last data filed at 08/02/2020 8413 Gross per 24 hour  Intake 1415.5 ml  Output --  Net 1415.5 ml   Filed Weights   08/01/20 2345  Weight: 108.9 kg    Examination:  GENERAL: No apparent distress.  Nontoxic. HEENT: MMM.  Vision and hearing grossly intact.  NECK: Supple.  No apparent JVD.  RESP: 95% to 6.5 L.  Mild IWOB.  Fair aeration bilaterally. CVS:  RRR. Heart sounds normal.  ABD/GI/GU: BS+. Abd soft, NTND.  MSK/EXT:  Moves extremities. No apparent deformity.  Trace edema bilaterally. SKIN: no apparent skin lesion or wound NEURO: Awake, alert and oriented appropriately.  No apparent focal  neuro deficit. PSYCH: Calm. Normal affect.  Procedures:  None  Microbiology summarized: COVID-19 and influenza PCR nonreactive. MRSA PCR negative. Blood and urine cultures pending.  Assessment & Plan: Acute respiratory failure with hypoxia: Documented desaturation to 89% on room air requiring supplemental oxygen.  Currently on 6.5 L by HFNC.  Patient is not on oxygen at baseline.  Given significant leukocytosis, elevated procalcitonin and hypotension, infectious process is high on differential.  However, CT chest without contrast without evidence of active pulmonary disease but calcified granuloma in the left lung and left helium.  She also have markedly elevated D-dimer concerning for VTE.  BNP and troponin marginally elevated as well but she has no chest pain. -Continue cefepime and Flagyl.  Discontinue vancomycin.  MRSA PCR negative. -Continue IV heparin pending VQ scan and LE [place] -Hold off IV diuretics for now -Wean oxygen as able. -Incentive spirometry/OOB/PT/OT  Elevated troponin/BNP: Demand ischemia.  Patient has no chest pain.  No prior history of CHF. -Check TTE.  SIRS: Has leukocytosis and tachycardia pia.  Also hypotensive on presentation raising concern for infectious process.  UA with nitrite and > 300 proteins. -Continue broad-spectrum antibiotics pending cultures  AKI on CKD-3B/azotemia: Baseline Cr 1.1-1.2.  AKI likely in the setting of possible sepsis and nephrotoxic meds (NSAIDs and ACE inhibitor's).  Creatinine seems to be plateauing Recent Labs    02/02/20 0811 06/21/20 0835 07/29/20 2255  08/01/20 0041 08/02/20 0559  BUN 23 20 25* 46* 46*  CREATININE 1.34* 1.19 1.54* 2.13* 2.27*  -Continue IV fluid -Recheck renal function in the morning -Continue holding ACE inhibitor's.  Hypernatremia: Na 147 -Check urine chemistry  Uncontrolled IDDM-2 with hyperglycemia, CKD and peripheral neuropathy: A1c 8.0% on 06/21/2020.  On Humalog 75/25 at 30 units twice daily,  metformin Recent Labs  Lab 08/02/20 0810  GLUCAP 193*  -Increase SSI to moderate -Continue atorvastatin, Zetia and gabapentin.  Proteinuria: UA with > 300 proteins. -Check UPC  Leukocytosis/bandemia: WBC 25 with left shift over the last 4 days. -Broad-spectrum antibiotics as above -Continue monitoring  Hyperbilirubinemia: Total bili 1.4. -Recheck in the morning  Hypotension in patient with history of hypertension: Likely from possible sepsis.  Hypotension resolved. -Continue home Coreg -Continue holding amlodipine, lisinopril  Normocytic anemia: Drop in Hgb likely dilutional.  She denies melena or hematochezia. Recent Labs    02/02/20 0811 06/21/20 0835 07/29/20 2255 08/02/20 0041 08/02/20 0559  HGB 11.8* 11.5* 10.2* 8.2* 8.4*  -Continue monitoring   Tobacco use disorder: Smokes about half a pack a day. -Encourage tobacco cessation -She is not interested in nicotine patch.  Chronic back pain-recent MRI of T and L-spine with moderate sized rightward disc extrusion at L4-5 with severe lateral recess stenosis and moderate multifactorial bilateral L4 neuroforaminal stenosis.  No red flags. -Outpatient follow-up -PT/OT eval   Morbid obesity Body mass index is 42.51 kg/m.         DVT prophylaxis:  On IV heparin empirically  Code Status: Full code Family Communication: Patient and/or RN. Available if any question.  Level of care: Progressive Status is: Inpatient  Remains inpatient appropriate because:Persistent severe electrolyte disturbances, Ongoing diagnostic testing needed not appropriate for outpatient work up, IV treatments appropriate due to intensity of illness or inability to take PO and Inpatient level of care appropriate due to severity of illness   Dispo: The patient is from: Home              Anticipated d/c is to: Home              Patient currently is not medically stable to d/c.   Difficult to place patient No       Consultants:   None   Sch Meds:  Scheduled Meds: . atorvastatin  40 mg Oral Daily  . carvedilol  25 mg Oral BID WC  . ezetimibe  10 mg Oral Daily  . gabapentin  300 mg Oral BID  . insulin aspart  0-15 Units Subcutaneous TID WC  . insulin aspart  0-5 Units Subcutaneous QHS   Continuous Infusions: . sodium chloride    . ceFEPime (MAXIPIME) IV    . heparin 1,650 Units/hr (08/02/20 1057)  . metronidazole     PRN Meds:.acetaminophen **OR** acetaminophen  Antimicrobials: Anti-infectives (From admission, onward)   Start     Dose/Rate Route Frequency Ordered Stop   08/04/20 0300  vancomycin (VANCOREADY) IVPB 1250 mg/250 mL  Status:  Discontinued        1,250 mg 166.7 mL/hr over 90 Minutes Intravenous Every 48 hours 08/02/20 0508 08/02/20 1115   08/02/20 1900  metroNIDAZOLE (FLAGYL) IVPB 500 mg        500 mg 100 mL/hr over 60 Minutes Intravenous Every 8 hours 08/02/20 1059     08/02/20 1400  ceFEPIme (MAXIPIME) 2 g in sodium chloride 0.9 % 100 mL IVPB        2 g 200 mL/hr over 30  Minutes Intravenous Every 12 hours 08/02/20 0502     08/02/20 1100  metroNIDAZOLE (FLAGYL) IVPB 500 mg  Status:  Discontinued        500 mg 100 mL/hr over 60 Minutes Intravenous Every 8 hours 08/02/20 0500 08/02/20 1100   08/02/20 0515  ceFEPIme (MAXIPIME) 2 g in sodium chloride 0.9 % 100 mL IVPB  Status:  Discontinued        2 g 200 mL/hr over 30 Minutes Intravenous  Once 08/02/20 0500 08/02/20 0502   08/02/20 0515  vancomycin (VANCOCIN) IVPB 1000 mg/200 mL premix  Status:  Discontinued        1,000 mg 200 mL/hr over 60 Minutes Intravenous  Once 08/02/20 0500 08/02/20 0503   08/02/20 0230  vancomycin (VANCOREADY) IVPB 2000 mg/400 mL        2,000 mg 200 mL/hr over 120 Minutes Intravenous STAT 08/02/20 0218 08/02/20 0441   08/02/20 0215  ceFEPIme (MAXIPIME) 2 g in sodium chloride 0.9 % 100 mL IVPB        2 g 200 mL/hr over 30 Minutes Intravenous  Once 08/02/20 0208 08/02/20 0250   08/02/20 0215  metroNIDAZOLE  (FLAGYL) IVPB 500 mg        500 mg 100 mL/hr over 60 Minutes Intravenous  Once 08/02/20 0208 08/02/20 0343   08/02/20 0215  vancomycin (VANCOCIN) IVPB 1000 mg/200 mL premix  Status:  Discontinued        1,000 mg 200 mL/hr over 60 Minutes Intravenous  Once 08/02/20 0208 08/02/20 0218       I have personally reviewed the following labs and images: CBC: Recent Labs  Lab 07/29/20 2255 08/02/20 0041 08/02/20 0559  WBC 24.8* 24.9* 25.1*  NEUTROABS 21.3* 21.7* 21.6*  HGB 10.2* 8.2* 8.4*  HCT 32.7* 26.6* 27.7*  MCV 94.8 96.0 97.2  PLT 351 311 289   BMP &GFR Recent Labs  Lab 07/29/20 2255 08/01/20 0041 08/02/20 0559  NA 142 142 147*  K 4.7 3.9 4.2  CL 107 109 114*  CO2 24 21* 21*  GLUCOSE 216* 230* 223*  BUN 25* 46* 46*  CREATININE 1.54* 2.13* 2.27*  CALCIUM 9.2 8.6* 8.6*   Estimated Creatinine Clearance: 30 mL/min (A) (by C-G formula based on SCr of 2.27 mg/dL (H)). Liver & Pancreas: Recent Labs  Lab 07/30/20 0255 08/02/20 0559  AST 17 17  ALT 14 19  ALKPHOS 101 135*  BILITOT 0.8 1.4*  PROT 6.2* 6.1*  ALBUMIN 2.8* 2.7*   No results for input(s): LIPASE, AMYLASE in the last 168 hours. No results for input(s): AMMONIA in the last 168 hours. Diabetic: No results for input(s): HGBA1C in the last 72 hours. Recent Labs  Lab 08/02/20 0810  GLUCAP 193*   Cardiac Enzymes: No results for input(s): CKTOTAL, CKMB, CKMBINDEX, TROPONINI in the last 168 hours. No results for input(s): PROBNP in the last 8760 hours. Coagulation Profile: Recent Labs  Lab 08/02/20 0041 08/02/20 0559  INR 1.1 1.1   Thyroid Function Tests: No results for input(s): TSH, T4TOTAL, FREET4, T3FREE, THYROIDAB in the last 72 hours. Lipid Profile: No results for input(s): CHOL, HDL, LDLCALC, TRIG, CHOLHDL, LDLDIRECT in the last 72 hours. Anemia Panel: No results for input(s): VITAMINB12, FOLATE, FERRITIN, TIBC, IRON, RETICCTPCT in the last 72 hours. Urine analysis:    Component Value  Date/Time   COLORURINE AMBER (A) 08/02/2020 0540   APPEARANCEUR CLEAR 08/02/2020 0540   LABSPEC 1.020 08/02/2020 0540   PHURINE 5.0 08/02/2020 0540   GLUCOSEU 50 (  A) 08/02/2020 0540   HGBUR NEGATIVE 08/02/2020 0540   BILIRUBINUR NEGATIVE 08/02/2020 0540   KETONESUR NEGATIVE 08/02/2020 0540   PROTEINUR >=300 (A) 08/02/2020 0540   UROBILINOGEN 0.2 11/29/2014 1231   NITRITE POSITIVE (A) 08/02/2020 0540   LEUKOCYTESUR NEGATIVE 08/02/2020 0540   Sepsis Labs: Invalid input(s): PROCALCITONIN, LACTICIDVEN  Microbiology: Recent Results (from the past 240 hour(s))  Culture, blood (routine x 2)     Status: None (Preliminary result)   Collection Time: 07/30/20  2:55 AM   Specimen: BLOOD LEFT ARM  Result Value Ref Range Status   Specimen Description BLOOD LEFT ARM  Final   Special Requests   Final    BOTTLES DRAWN AEROBIC AND ANAEROBIC Blood Culture adequate volume   Culture   Final    NO GROWTH 3 DAYS Performed at [facility], [address], [place], [place] [address]    Report Status PENDING  Incomplete  Culture, blood (routine x 2)     Status: None (Preliminary result)   Collection Time: 07/30/20  2:58 AM   Specimen: BLOOD LEFT HAND  Result Value Ref Range Status   Specimen Description BLOOD LEFT HAND  Final   Special Requests   Final    BOTTLES DRAWN AEROBIC AND ANAEROBIC Blood Culture adequate volume   Culture   Final    NO GROWTH 3 DAYS Performed at [facility], [address], [place], [place] [address]    Report Status PENDING  Incomplete  Resp Panel by RT-PCR (Flu A&B, Covid) Nasopharyngeal Swab     Status: None   Collection Time: 07/30/20  3:05 AM   Specimen: Nasopharyngeal Swab; Nasopharyngeal(NP) swabs in vial transport medium  Result Value Ref Range Status   SARS Coronavirus 2 by RT PCR NEGATIVE NEGATIVE Final    Comment: (NOTE) SARS-CoV-2 target nucleic acids are NOT DETECTED.  The SARS-CoV-2 RNA is generally detectable in upper  respiratory specimens during the acute phase of infection. The lowest concentration of SARS-CoV-2 viral copies this assay can detect is 138 copies/mL. A negative result does not preclude SARS-Cov-2 infection and should not be used as the sole basis for treatment or other patient management decisions. A negative result may occur with  improper specimen collection/handling, submission of specimen other than nasopharyngeal swab, presence of viral mutation(s) within the areas targeted by this assay, and inadequate number of viral copies(<138 copies/mL). A negative result must be combined with clinical observations, patient history, and epidemiological information. The expected result is Negative.  Fact Sheet for Patients:  BloggerCourse.com  Fact Sheet for Healthcare Providers:  SeriousBroker.it  This test is no t yet approved or cleared by the [place] FDA and  has been authorized for detection and/or diagnosis of SARS-CoV-2 by FDA under an Emergency Use Authorization (EUA). This EUA will remain  in effect (meaning this test can be used) for the duration of the COVID-19 declaration under Section 564(b)(1) of the Act, 21 U.S.C.section 360bbb-3(b)(1), unless the authorization is terminated  or revoked sooner.       Influenza A by PCR NEGATIVE NEGATIVE Final   Influenza B by PCR NEGATIVE NEGATIVE Final    Comment: (NOTE) The Xpert Xpress SARS-CoV-2/FLU/RSV plus assay is intended as an aid in the diagnosis of influenza from Nasopharyngeal swab specimens and should not be used as a sole basis for treatment. Nasal washings and aspirates are unacceptable for Xpert Xpress SARS-CoV-2/FLU/RSV testing.  Fact Sheet for Patients: BloggerCourse.com  Fact Sheet for Healthcare Providers: SeriousBroker.it  This test is not yet approved or cleared by the [place] and has been  authorized for detection and/or diagnosis of SARS-CoV-2 by FDA under an Emergency Use Authorization (EUA). This EUA will remain in effect (meaning this test can be used) for the duration of the COVID-19 declaration under Section 564(b)(1) of the Act, 21 U.S.C. section 360bbb-3(b)(1), unless the authorization is terminated or revoked.  Performed at [facility], [address], [place], [place] [address]   Urine culture     Status: Abnormal   Collection Time: 07/30/20 11:42 AM   Specimen: Urine, Clean Catch  Result Value Ref Range Status   Specimen Description URINE, CLEAN CATCH  Final   Special Requests NONE  Final   Culture (A)  Final    <10,000 COLONIES/mL INSIGNIFICANT GROWTH Performed at [facility], [address] [place], [place] [address]    Report Status 07/31/2020 FINAL  Final  Resp Panel by RT-PCR (Flu A&B, Covid) Nasopharyngeal Swab     Status: None   Collection Time: 08/02/20  2:32 AM   Specimen: Nasopharyngeal Swab; Nasopharyngeal(NP) swabs in vial transport medium  Result Value Ref Range Status   SARS Coronavirus 2 by RT PCR NEGATIVE NEGATIVE Final    Comment: (NOTE) SARS-CoV-2 target nucleic acids are NOT DETECTED.  The SARS-CoV-2 RNA is generally detectable in upper respiratory specimens during the acute phase of infection. The lowest concentration of SARS-CoV-2 viral copies this assay can detect is 138 copies/mL. A negative result does not preclude SARS-Cov-2 infection and should not be used as the sole basis for treatment or other patient management decisions. A negative result may occur with  improper specimen collection/handling, submission of specimen other than nasopharyngeal swab, presence of viral mutation(s) within the areas targeted by this assay, and inadequate number of viral copies(<138 copies/mL). A negative result must be combined with clinical observations, patient history, and epidemiological information. The expected result is  Negative.  Fact Sheet for Patients:  BloggerCourse.com  Fact Sheet for Healthcare Providers:  SeriousBroker.it  This test is no t yet approved or cleared by the [place] FDA and  has been authorized for detection and/or diagnosis of SARS-CoV-2 by FDA under an Emergency Use Authorization (EUA). This EUA will remain  in effect (meaning this test can be used) for the duration of the COVID-19 declaration under Section 564(b)(1) of the Act, 21 U.S.C.section 360bbb-3(b)(1), unless the authorization is terminated  or revoked sooner.       Influenza A by PCR NEGATIVE NEGATIVE Final   Influenza B by PCR NEGATIVE NEGATIVE Final    Comment: (NOTE) The Xpert Xpress SARS-CoV-2/FLU/RSV plus assay is intended as an aid in the diagnosis of influenza from Nasopharyngeal swab specimens and should not be used as a sole basis for treatment. Nasal washings and aspirates are unacceptable for Xpert Xpress SARS-CoV-2/FLU/RSV testing.  Fact Sheet for Patients: BloggerCourse.com  Fact Sheet for Healthcare Providers: SeriousBroker.it  This test is not yet approved or cleared by the [place] FDA and has been authorized for detection and/or diagnosis of SARS-CoV-2 by FDA under an Emergency Use Authorization (EUA). This EUA will remain in effect (meaning this test can be used) for the duration of the COVID-19 declaration under Section 564(b)(1) of the Act, 21 U.S.C. section 360bbb-3(b)(1), unless the authorization is terminated or revoked.  Performed at [facility], [address], [place], [place] [address]   MRSA PCR Screening     Status: None  Collection Time: 08/02/20  9:05 AM   Specimen: Nasopharyngeal  Result Value Ref Range Status   MRSA by PCR NEGATIVE NEGATIVE Final    Comment:        The GeneXpert MRSA Assay (FDA approved for NASAL specimens only), is one  component of a comprehensive MRSA colonization surveillance program. It is not intended to diagnose MRSA infection nor to guide or monitor treatment for MRSA infections. Performed at [facility], [address], [place], [place] [address]     Radiology Studies: CT Chest Wo Contrast  Result Date: 08/02/2020 CLINICAL DATA:  Respiratory distress with shortness of breath. Nondiagnostic x-ray. EXAM: CT CHEST WITHOUT CONTRAST TECHNIQUE: Multidetector CT imaging of the chest was performed following the standard protocol without IV contrast. COMPARISON:  Chest radiograph 08/02/2020 FINDINGS: Cardiovascular: Lack of IV contrast material limits evaluation. Normal heart size. No pericardial effusions. Mild to moderate coronary artery and aortic calcification. No aortic aneurysm. Mediastinum/Nodes: Esophagus is decompressed. No significant lymphadenopathy in the chest. Thyroid gland is unremarkable. Lungs/Pleura: Calcified granulomas in the left lung and left hilum. No airspace disease or consolidation in the lungs. No pleural effusions. No pneumothorax. Airways are patent. Upper Abdomen: Calcified granulomas in the spleen. Vascular calcifications. No acute abnormalities demonstrated in the visualized upper abdomen. Musculoskeletal: Mild degenerative changes in the spine. No destructive bone lesions. IMPRESSION: 1. No evidence of active pulmonary disease. Calcified granulomas in the left lung and left hilum. 2. Aortic atherosclerosis. Aortic Atherosclerosis (ICD10-I70.0). Electronically Signed   By: Burman Nieves M.D.   On: 08/02/2020 03:00   DG Chest Port 1 View  Result Date: 08/02/2020 CLINICAL DATA:  Dyspnea EXAM: PORTABLE CHEST 1 VIEW COMPARISON:  07/30/2020 FINDINGS: The heart size and mediastinal contours are within normal limits. Both lungs are clear. The visualized skeletal structures are unremarkable. IMPRESSION: No active disease. Electronically Signed   By: Helyn Numbers MD    On: 08/02/2020 01:35   VAS [place] LOWER EXTREMITY VENOUS (DVT)  Result Date: 08/02/2020  Lower Venous DVT Study Patient Name:  Shirley Chen  Date of Exam:   08/02/2020 Medical Rec #: [id number]       Accession #:    [id number] Date of Birth: 08/12/1955       Patient Gender: F Patient Age:   64Y Exam Location:  [facility] Procedure:      VAS [place] LOWER EXTREMITY VENOUS (DVT) Referring Phys: [id number] Meryle Ready [facility] --------------------------------------------------------------------------------  Indications: Elevated Ddimer.  Risk Factors: None identified. Limitations: Body habitus, poor ultrasound/tissue interface and patient movement, patient pain tolerance. Comparison Study: No prior studies. Performing Technologist: Chanda Busing RVT  Examination Guidelines: A complete evaluation includes B-mode imaging, spectral Doppler, color Doppler, and power Doppler as needed of all accessible portions of each vessel. Bilateral testing is considered an integral part of a complete examination. Limited examinations for reoccurring indications may be performed as noted. The reflux portion of the exam is performed with the patient in reverse Trendelenburg.  +---------+---------------+---------+-----------+----------+--------------+ RIGHT    CompressibilityPhasicitySpontaneityPropertiesThrombus Aging +---------+---------------+---------+-----------+----------+--------------+ CFV      Full           Yes      Yes                                 +---------+---------------+---------+-----------+----------+--------------+ SFJ      Full                                                        +---------+---------------+---------+-----------+----------+--------------+  FV Prox  Full                                                        +---------+---------------+---------+-----------+----------+--------------+ FV Mid   Full                                                         +---------+---------------+---------+-----------+----------+--------------+ FV DistalFull                                                        +---------+---------------+---------+-----------+----------+--------------+ PFV      Full                                                        +---------+---------------+---------+-----------+----------+--------------+ POP      Full           Yes      Yes                                 +---------+---------------+---------+-----------+----------+--------------+ PTV      Full                                                        +---------+---------------+---------+-----------+----------+--------------+ PERO     Full                                                        +---------+---------------+---------+-----------+----------+--------------+   +---------+---------------+---------+-----------+----------+--------------+ LEFT     CompressibilityPhasicitySpontaneityPropertiesThrombus Aging +---------+---------------+---------+-----------+----------+--------------+ CFV      Full           Yes      Yes                                 +---------+---------------+---------+-----------+----------+--------------+ SFJ      Full                                                        +---------+---------------+---------+-----------+----------+--------------+ FV Prox  Full                                                        +---------+---------------+---------+-----------+----------+--------------+  FV Mid   Full                                                        +---------+---------------+---------+-----------+----------+--------------+ FV Distal               Yes      Yes                                 +---------+---------------+---------+-----------+----------+--------------+ PFV      Full                                                         +---------+---------------+---------+-----------+----------+--------------+ POP      Full           Yes      Yes                                 +---------+---------------+---------+-----------+----------+--------------+ PTV      Full                                                        +---------+---------------+---------+-----------+----------+--------------+ PERO     Full                                                        +---------+---------------+---------+-----------+----------+--------------+    Summary: RIGHT: - There is no evidence of deep vein thrombosis in the lower extremity. However, portions of this examination were limited- see technologist comments above.  - No cystic structure found in the popliteal fossa.  LEFT: - There is no evidence of deep vein thrombosis in the lower extremity. However, portions of this examination were limited- see technologist comments above.  - No cystic structure found in the popliteal fossa.  *See table(s) above for measurements and observations.    Preliminary     Additional 40 minutes with more than 50% spent in reviewing records, counseling patient/family and coordinating care.   Brycin Kille T. Braxton V[facility]  If 7PM-7AM, please contact night-coverage [email or web address] 08/02/2020, 11:21 AM

## 2020-08-02 NOTE — Progress Notes (Signed)
ANTICOAGULATION CONSULT NOTE - Follow Up Consult  Pharmacy Consult for heparin Indication: r/o pulmonary embolism  Allergies  Allergen Reactions  . Losartan Nausea And Vomiting  . Morphine And Related     Pt states " I just got really sick"    Patient Measurements: Height: 5\' 3"  (160 cm) Weight: 108.9 kg (240 lb) IBW/kg (Calculated) : 52.4 Heparin Dosing Weight: 78 kg  Vital Signs: Temp: 98.7 F (37.1 C) (04/28 0656) Temp Source: Oral (04/28 0656) BP: 136/62 (04/28 0656) Pulse Rate: 73 (04/28 0706)  Labs: Recent Labs    08/01/20 0041 08/02/20 0041 08/02/20 0559 08/02/20 0733 08/02/20 0952  HGB  --  8.2* 8.4*  --   --   HCT  --  26.6* 27.7*  --   --   PLT  --  311 289  --   --   APTT  --  33 43*  --   --   LABPROT  --  14.5 14.3  --   --   INR  --  1.1 1.1  --   --   HEPARINUNFRC  --   --   --   --  <0.10*  CREATININE 2.13*  --  2.27*  --   --   TROPONINIHS  --   --  118* 107*  --     Estimated Creatinine Clearance: 30 mL/min (A) (by C-G formula based on SCr of 2.27 mg/dL (H)).   Medical History: Past Medical History:  Diagnosis Date  . Arthritis    "joints in hands and right leg ache" (10/20/2014)  . Cellulitis of right foot 10/2013  . Constipation   . Diabetes mellitus without complication (HCC)    Type II  . Headache    "maybe weekly" (10/20/2014)  . Hyperlipidemia   . Hypertension   . Tobacco abuse   . UTI (lower urinary tract infection)    Medications:  No oral anticoagulation PTA  Assessment: Patient is a 37 yoF presenting with SOB. Pharmacy consulted to dose heparin for potential VTE.   4/28: venous doppler negative for DVT but limited study 4/28 VQ scan ordered  Today, 08/02/20  HL undetectable on heparin infusion of 1400 units/hr  Confirmed with RN that heparin infusing at correct rate. No issues with the line. No bleeding reported.   CBC: Hgb low, Plt WNL  Goal of Therapy:  Heparin level 0.3-0.7 units/ml Monitor platelets by  anticoagulation protocol: Yes   Plan:   Heparin bolus of 2300 units  Increase heparin drip to 1650 units/hr  Check 8 hour HL  Daily heparin level and CBC  Monitor for signs & symptoms of bleeding  08/04/20, PharmD 08/02/2020,11:57 AM

## 2020-08-02 NOTE — ED Notes (Signed)
Pt's O2 sats dropping into the mid-low 80s. Increased O2 to 6 L via nasal cannula, O2 sats now 94%.

## 2020-08-02 NOTE — Progress Notes (Signed)
Bilateral lower extremity venous duplex has been completed. Preliminary results can be found in CV Proc through chart review.   08/02/20 8:55 AM Olen Cordial RVT

## 2020-08-03 ENCOUNTER — Other Ambulatory Visit (HOSPITAL_COMMUNITY): Payer: Medicare Other

## 2020-08-03 ENCOUNTER — Inpatient Hospital Stay (HOSPITAL_COMMUNITY): Payer: Medicare Other

## 2020-08-03 DIAGNOSIS — N2889 Other specified disorders of kidney and ureter: Secondary | ICD-10-CM

## 2020-08-03 DIAGNOSIS — J9601 Acute respiratory failure with hypoxia: Secondary | ICD-10-CM | POA: Diagnosis not present

## 2020-08-03 DIAGNOSIS — I151 Hypertension secondary to other renal disorders: Secondary | ICD-10-CM | POA: Diagnosis not present

## 2020-08-03 DIAGNOSIS — A419 Sepsis, unspecified organism: Principal | ICD-10-CM

## 2020-08-03 DIAGNOSIS — R6520 Severe sepsis without septic shock: Secondary | ICD-10-CM

## 2020-08-03 DIAGNOSIS — E1151 Type 2 diabetes mellitus with diabetic peripheral angiopathy without gangrene: Secondary | ICD-10-CM | POA: Diagnosis not present

## 2020-08-03 DIAGNOSIS — N179 Acute kidney failure, unspecified: Secondary | ICD-10-CM | POA: Diagnosis not present

## 2020-08-03 DIAGNOSIS — R531 Weakness: Secondary | ICD-10-CM

## 2020-08-03 DIAGNOSIS — J189 Pneumonia, unspecified organism: Secondary | ICD-10-CM

## 2020-08-03 LAB — RENAL FUNCTION PANEL
Albumin: 2.5 g/dL — ABNORMAL LOW (ref 3.5–5.0)
Anion gap: 9 (ref 5–15)
BUN: 56 mg/dL — ABNORMAL HIGH (ref 8–23)
CO2: 21 mmol/L — ABNORMAL LOW (ref 22–32)
Calcium: 8.5 mg/dL — ABNORMAL LOW (ref 8.9–10.3)
Chloride: 112 mmol/L — ABNORMAL HIGH (ref 98–111)
Creatinine, Ser: 2.15 mg/dL — ABNORMAL HIGH (ref 0.44–1.00)
GFR, Estimated: 25 mL/min — ABNORMAL LOW (ref >60)
Glucose, Bld: 155 mg/dL — ABNORMAL HIGH (ref 70–99)
Phosphorus: 4.7 mg/dL — ABNORMAL HIGH (ref 2.5–4.6)
Potassium: 4 mmol/L (ref 3.5–5.1)
Sodium: 142 mmol/L (ref 135–145)

## 2020-08-03 LAB — CBC
HCT: 25.7 % — ABNORMAL LOW (ref 36.0–46.0)
Hemoglobin: 7.7 g/dL — ABNORMAL LOW (ref 12.0–15.0)
MCH: 29.1 pg (ref 26.0–34.0)
MCHC: 30 g/dL (ref 30.0–36.0)
MCV: 97 fL (ref 80.0–100.0)
Platelets: 280 10*3/uL (ref 150–400)
RBC: 2.65 MIL/uL — ABNORMAL LOW (ref 3.87–5.11)
RDW: 14.2 % (ref 11.5–15.5)
WBC: 20.7 10*3/uL — ABNORMAL HIGH (ref 4.0–10.5)
nRBC: 0 % (ref 0.0–0.2)

## 2020-08-03 LAB — OSMOLALITY, URINE: Osmolality, Ur: 498 mOsm/kg (ref 300–900)

## 2020-08-03 LAB — URINE CULTURE: Culture: NO GROWTH

## 2020-08-03 LAB — GLUCOSE, CAPILLARY
Glucose-Capillary: 149 mg/dL — ABNORMAL HIGH (ref 70–99)
Glucose-Capillary: 131 mg/dL — ABNORMAL HIGH (ref 70–99)
Glucose-Capillary: 166 mg/dL — ABNORMAL HIGH (ref 70–99)
Glucose-Capillary: 134 mg/dL — ABNORMAL HIGH (ref 70–99)

## 2020-08-03 LAB — PROCALCITONIN: Procalcitonin: 0.42 ng/mL

## 2020-08-03 LAB — MAGNESIUM: Magnesium: 2.5 mg/dL — ABNORMAL HIGH (ref 1.7–2.4)

## 2020-08-03 MED ORDER — AZITHROMYCIN 250 MG PO TABS
500.0000 mg | ORAL_TABLET | Freq: Every day | ORAL | Status: DC
  Administered 2020-08-03 – 2020-08-05 (×3): 500 mg via ORAL
  Filled 2020-08-03 (×3): qty 2

## 2020-08-03 NOTE — Progress Notes (Signed)
  Echocardiogram 2D Echocardiogram was attempted but ultrasound was with patient. We will try again tomorrow.   Shirley Chen 08/03/2020, 2:40 PM

## 2020-08-03 NOTE — Progress Notes (Signed)
PROGRESS NOTE  Jorden Mahl WUJ:[id number] DOB: 1955/11/22   PCP: Olive Bass, FNP  Patient is from: Home  DOA: 08/01/2020 LOS: 1  Chief complaints: Shortness of breath  Brief Narrative / Interim history: 65 year old F with PMH of DM-2, HTN, CKD-3, PVD, HLD and tobacco abuse presenting with progressive shortness of breath, lower extremity edema and nausea and admitted with SIRS, acute respiratory failure with hypoxia and elevated D-dimer.  She was empirically started on broad-spectrum antibiotics and IV heparin pending cultures, VQ scan and LE [place].  CT chest without contrast without acute finding.  She also had mildly elevated troponin and BNP.   VQ scan with low probability for central PE.  LE [place] negative for DVT.  Heparin discontinued.  She continues to have significant O2 requirement.   Subjective: Seen and examined earlier this morning.  No major events overnight of this morning.  She denies chest pain, shortness of breath, GI or UTI symptoms.  However, she is requiring about a liter by nasal cannula at rest.   Objective: Vitals:   08/03/20 0146 08/03/20 0418 08/03/20 0618 08/03/20 1309  BP: (!) 132/55 (!) 135/58 (!) 144/59 (!) 137/59  Pulse: 74 68 69 66  Resp: Temp: 99.1 F (37.3 C) 98.4 F (36.9 C) 99.1 F (37.3 C) 98.7 F (37.1 C)  TempSrc: Oral Oral Oral Oral  SpO2: 94% 93% 92% 97%  Weight:  109.4 kg    Height:       No intake or output data in the 24 hours ending 08/03/20 1345 Filed Weights   08/01/20 2345 08/03/20 0418  Weight: 108.9 kg 109.4 kg    Examination:  GENERAL: No apparent distress.  Nontoxic. HEENT: MMM.  Vision and hearing grossly intact.  NECK: Supple.  No apparent JVD.  RESP: 94% on 8 L by [place].  No IWOB.  Fair aeration bilaterally. CVS:  RRR. Heart sounds normal.  ABD/GI/GU: BS+. Abd soft, NTND.  MSK/EXT:  Moves extremities. No apparent deformity.  Trace edema bilaterally. SKIN: no apparent skin lesion or wound NEURO:  Awake, alert and oriented appropriately.  No apparent focal neuro deficit. PSYCH: Calm. Normal affect.  Procedures:  None  Microbiology summarized: COVID-19 and influenza PCR nonreactive. MRSA PCR negative. Blood and urine cultures NGTD  Assessment & Plan: Acute respiratory failure with hypoxia: Documented desaturation to 89% on room air requiring supplemental oxygen.  Currently on 8 L at rest.  Patient is not on oxygen at baseline.  Given significant leukocytosis, elevated procalcitonin and hypotension, infectious process is high on differential.  CT chest and CXR negative for pneumonia or significant CHF.  D-dimer elevated but VQ scan and LE [place] negative.  BNP and troponin marginally elevated as well but she has no chest pain. -Continue cefepime and Flagyl. -Add azithromycin for atypical coverage -Discontinue IV fluid.  Hold off IV diuretics for now -Wean oxygen as able. -Incentive spirometry/OOB/PT/OT  Elevated troponin/BNP: Demand ischemia.  Patient has no chest pain.  No prior history of CHF. -Follow TTE  Severe sepsis due to pneumonia: POA: Has leukocytosis, tachycardia, tachypnea, AKI, respiratory failure and hypotension.  UA with nitrite and > 300 proteins.  Cultures and MRSA PCR negative. -Antibiotics as above  AKI on CKD-3B/azotemia: Baseline Cr 1.1-1.2.  AKI likely in the setting of possible sepsis and nephrotoxic meds (NSAIDs and ACE inhibitor's). FENa 1.6% suggesting intrinsic process.  UPC 0.49.  Creatinine seems to be plateauing Recent Labs    02/02/20 0811 06/21/20 0835 07/29/20  2255 08/01/20 0041 08/02/20 0559 08/03/20 0411  BUN 23 20 25* 46* 46* 56*  CREATININE 1.34* 1.19 1.54* 2.13* 2.27* 2.15*  -Hold IV fluids -Recheck renal function in the morning -Continue holding ACE inhibitor's. -Check renal ultrasound  Hypernatremia: Na 147> 142.  Urine Na 211 suggesting some element of SIADH. -Recheck in the morning  Uncontrolled IDDM-2 with hyperglycemia, CKD and  peripheral neuropathy: A1c 8.0% on 06/21/2020.  On Humalog 75/25 at 30 units twice daily, metformin Recent Labs  Lab 08/02/20 1230 08/02/20 1620 08/02/20 2004 08/03/20 0715 08/03/20 1213  GLUCAP 189* 181* 160* 149* 131*  -Continue SSI-moderate -Continue atorvastatin, Zetia and gabapentin.  Proteinuria: UA with > 300 proteins.  UPC 0.5 arguing against nephrotic syndrome. -Resume ACE inhibitor's once renal function improves  Leukocytosis/bandemia: WBC 25>> 20. -Antibiotics as above -Continue monitoring  Hyperbilirubinemia: Total bili 1.4. -Recheck in the morning  Hypotension in patient with history of hypertension: Likely from possible sepsis.  Hypotension resolved. -Continue home Coreg -Continue holding amlodipine, lisinopril  Normocytic anemia: Drop in Hgb likely dilutional.  She denies melena or hematochezia. Recent Labs    02/02/20 0811 06/21/20 0835 07/29/20 2255 08/02/20 0041 08/02/20 0559 08/03/20 0411  HGB 11.8* 11.5* 10.2* 8.2* 8.4* 7.7*  -Continue monitoring -Discontinue IV fluid -Recheck in the afternoon   Tobacco use disorder: Smokes about half a pack a day. -Encourage tobacco cessation -She is not interested in nicotine patch.  Chronic back pain-recent MRI of T and L-spine with moderate sized rightward disc extrusion at L4-5 with severe lateral recess stenosis and moderate multifactorial bilateral L4 neuroforaminal stenosis.  No red flags -As needed Tylenol, tramadol and oxycodone based on pain scale -Outpatient follow-up -PT/OT eval   Morbid obesity Body mass index is 42.72 kg/m.         DVT prophylaxis:  On IV heparin empirically  Code Status: Full code Family Communication: Patient and/or RN. Available if any question.  Level of care: Progressive Status is: Inpatient  Remains inpatient appropriate because:Persistent severe electrolyte disturbances, Ongoing diagnostic testing needed not appropriate for outpatient work up, IV treatments  appropriate due to intensity of illness or inability to take PO and Inpatient level of care appropriate due to severity of illness   Dispo: The patient is from: Home              Anticipated d/c is to: Home              Patient currently is not medically stable to d/c.   Difficult to place patient No       Consultants:  None   Sch Meds:  Scheduled Meds: . atorvastatin  40 mg Oral Daily  . azithromycin  500 mg Oral Daily  . carvedilol  25 mg Oral BID WC  . ezetimibe  10 mg Oral Daily  . gabapentin  300 mg Oral BID  . heparin  5,000 Units Subcutaneous Q8H  . insulin aspart  0-15 Units Subcutaneous TID WC  . insulin aspart  0-5 Units Subcutaneous QHS   Continuous Infusions: . ceFEPime (MAXIPIME) IV 2 g (08/03/20 1331)  . metronidazole 500 mg (08/03/20 1202)   PRN Meds:.acetaminophen **OR** acetaminophen, oxyCODONE, traMADol  Antimicrobials: Anti-infectives (From admission, onward)   Start     Dose/Rate Route Frequency Ordered Stop   08/04/20 0300  vancomycin (VANCOREADY) IVPB 1250 mg/250 mL  Status:  Discontinued        1,250 mg 166.7 mL/hr over 90 Minutes Intravenous Every 48 hours 08/02/20 0508 08/02/20 1115  08/03/20 1300  azithromycin (ZITHROMAX) tablet 500 mg        500 mg Oral Daily 08/03/20 1203 08/08/20 0959   08/02/20 1900  metroNIDAZOLE (FLAGYL) IVPB 500 mg        500 mg 100 mL/hr over 60 Minutes Intravenous Every 8 hours 08/02/20 1059     08/02/20 1400  ceFEPIme (MAXIPIME) 2 g in sodium chloride 0.9 % 100 mL IVPB        2 g 200 mL/hr over 30 Minutes Intravenous Every 12 hours 08/02/20 0502     08/02/20 1100  metroNIDAZOLE (FLAGYL) IVPB 500 mg  Status:  Discontinued        500 mg 100 mL/hr over 60 Minutes Intravenous Every 8 hours 08/02/20 0500 08/02/20 1100   08/02/20 0515  ceFEPIme (MAXIPIME) 2 g in sodium chloride 0.9 % 100 mL IVPB  Status:  Discontinued        2 g 200 mL/hr over 30 Minutes Intravenous  Once 08/02/20 0500 08/02/20 0502   08/02/20 0515   vancomycin (VANCOCIN) IVPB 1000 mg/200 mL premix  Status:  Discontinued        1,000 mg 200 mL/hr over 60 Minutes Intravenous  Once 08/02/20 0500 08/02/20 0503   08/02/20 0230  vancomycin (VANCOREADY) IVPB 2000 mg/400 mL        2,000 mg 200 mL/hr over 120 Minutes Intravenous STAT 08/02/20 0218 08/02/20 0441   08/02/20 0215  ceFEPIme (MAXIPIME) 2 g in sodium chloride 0.9 % 100 mL IVPB        2 g 200 mL/hr over 30 Minutes Intravenous  Once 08/02/20 0208 08/02/20 0250   08/02/20 0215  metroNIDAZOLE (FLAGYL) IVPB 500 mg        500 mg 100 mL/hr over 60 Minutes Intravenous  Once 08/02/20 0208 08/02/20 0343   08/02/20 0215  vancomycin (VANCOCIN) IVPB 1000 mg/200 mL premix  Status:  Discontinued        1,000 mg 200 mL/hr over 60 Minutes Intravenous  Once 08/02/20 0208 08/02/20 0218       I have personally reviewed the following labs and images: CBC: Recent Labs  Lab 07/29/20 2255 08/02/20 0041 08/02/20 0559 08/03/20 0411  WBC 24.8* 24.9* 25.1* 20.7*  NEUTROABS 21.3* 21.7* 21.6*  --   HGB 10.2* 8.2* 8.4* 7.7*  HCT 32.7* 26.6* 27.7* 25.7*  MCV 94.8 96.0 97.2 97.0  PLT 351 311 289 280   BMP &GFR Recent Labs  Lab 07/29/20 2255 08/01/20 0041 08/02/20 0559 08/03/20 0411  NA 142 142 147* 142  K 4.7 3.9 4.2 4.0  CL 107 109 114* 112*  CO2 24 21* 21* 21*  GLUCOSE 216* 230* 223* 155*  BUN 25* 46* 46* 56*  CREATININE 1.54* 2.13* 2.27* 2.15*  CALCIUM 9.2 8.6* 8.6* 8.5*  MG  --   --   --  2.5*  PHOS  --   --   --  4.7*   Estimated Creatinine Clearance: 31.8 mL/min (A) (by C-G formula based on SCr of 2.15 mg/dL (H)). Liver & Pancreas: Recent Labs  Lab 07/30/20 0255 08/02/20 0559 08/03/20 0411  AST 17 17  --   ALT 14 19  --   ALKPHOS 101 135*  --   BILITOT 0.8 1.4*  --   PROT 6.2* 6.1*  --   ALBUMIN 2.8* 2.7* 2.5*   No results for input(s): LIPASE, AMYLASE in the last 168 hours. No results for input(s): AMMONIA in the last 168 hours. Diabetic: No results for input(s):  HGBA1C in the last 72 hours. Recent Labs  Lab 08/02/20 1230 08/02/20 1620 08/02/20 2004 08/03/20 0715 08/03/20 1213  GLUCAP 189* 181* 160* 149* 131*   Cardiac Enzymes: No results for input(s): CKTOTAL, CKMB, CKMBINDEX, TROPONINI in the last 168 hours. No results for input(s): PROBNP in the last 8760 hours. Coagulation Profile: Recent Labs  Lab 08/02/20 0041 08/02/20 0559  INR 1.1 1.1   Thyroid Function Tests: No results for input(s): TSH, T4TOTAL, FREET4, T3FREE, THYROIDAB in the last 72 hours. Lipid Profile: No results for input(s): CHOL, HDL, LDLCALC, TRIG, CHOLHDL, LDLDIRECT in the last 72 hours. Anemia Panel: No results for input(s): VITAMINB12, FOLATE, FERRITIN, TIBC, IRON, RETICCTPCT in the last 72 hours. Urine analysis:    Component Value Date/Time   COLORURINE AMBER (A) 08/02/2020 0540   APPEARANCEUR CLEAR 08/02/2020 0540   LABSPEC 1.020 08/02/2020 0540   PHURINE 5.0 08/02/2020 0540   GLUCOSEU 50 (A) 08/02/2020 0540   HGBUR NEGATIVE 08/02/2020 0540   BILIRUBINUR NEGATIVE 08/02/2020 0540   KETONESUR NEGATIVE 08/02/2020 0540   PROTEINUR >=300 (A) 08/02/2020 0540   UROBILINOGEN 0.2 11/29/2014 1231   NITRITE POSITIVE (A) 08/02/2020 0540   LEUKOCYTESUR NEGATIVE 08/02/2020 0540   Sepsis Labs: Invalid input(s): PROCALCITONIN, LACTICIDVEN  Microbiology: Recent Results (from the past 240 hour(s))  Culture, blood (routine x 2)     Status: None (Preliminary result)   Collection Time: 07/30/20  2:55 AM   Specimen: BLOOD LEFT ARM  Result Value Ref Range Status   Specimen Description BLOOD LEFT ARM  Final   Special Requests   Final    BOTTLES DRAWN AEROBIC AND ANAEROBIC Blood Culture adequate volume   Culture   Final    NO GROWTH 4 DAYS Performed at [facility], [address] [place], [place] [address]    Report Status PENDING  Incomplete  Culture, blood (routine x 2)     Status: None (Preliminary result)   Collection Time: 07/30/20  2:58 AM   Specimen:  BLOOD LEFT HAND  Result Value Ref Range Status   Specimen Description BLOOD LEFT HAND  Final   Special Requests   Final    BOTTLES DRAWN AEROBIC AND ANAEROBIC Blood Culture adequate volume   Culture   Final    NO GROWTH 4 DAYS Performed at [facility], [address], [place], [place] [address]    Report Status PENDING  Incomplete  Resp Panel by RT-PCR (Flu A&B, Covid) Nasopharyngeal Swab     Status: None   Collection Time: 07/30/20  3:05 AM   Specimen: Nasopharyngeal Swab; Nasopharyngeal(NP) swabs in vial transport medium  Result Value Ref Range Status   SARS Coronavirus 2 by RT PCR NEGATIVE NEGATIVE Final    Comment: (NOTE) SARS-CoV-2 target nucleic acids are NOT DETECTED.  The SARS-CoV-2 RNA is generally detectable in upper respiratory specimens during the acute phase of infection. The lowest concentration of SARS-CoV-2 viral copies this assay can detect is 138 copies/mL. A negative result does not preclude SARS-Cov-2 infection and should not be used as the sole basis for treatment or other patient management decisions. A negative result may occur with  improper specimen collection/handling, submission of specimen other than nasopharyngeal swab, presence of viral mutation(s) within the areas targeted by this assay, and inadequate number of viral copies(<138 copies/mL). A negative result must be combined with clinical observations, patient history, and epidemiological information. The expected result is Negative.  Fact Sheet for Patients:  BloggerCourse.com  Fact Sheet for Healthcare  Providers:  SeriousBroker.it  This test is no t yet approved or cleared by the [place] and  has been authorized for detection and/or diagnosis of SARS-CoV-2 by FDA under an Emergency Use Authorization (EUA). This EUA will remain  in effect (meaning this test can be used) for the duration of the COVID-19 declaration under Section  564(b)(1) of the Act, 21 U.S.C.section 360bbb-3(b)(1), unless the authorization is terminated  or revoked sooner.       Influenza A by PCR NEGATIVE NEGATIVE Final   Influenza B by PCR NEGATIVE NEGATIVE Final    Comment: (NOTE) The Xpert Xpress SARS-CoV-2/FLU/RSV plus assay is intended as an aid in the diagnosis of influenza from Nasopharyngeal swab specimens and should not be used as a sole basis for treatment. Nasal washings and aspirates are unacceptable for Xpert Xpress SARS-CoV-2/FLU/RSV testing.  Fact Sheet for Patients: BloggerCourse.com  Fact Sheet for Healthcare Providers: SeriousBroker.it  This test is not yet approved or cleared by the [place] FDA and has been authorized for detection and/or diagnosis of SARS-CoV-2 by FDA under an Emergency Use Authorization (EUA). This EUA will remain in effect (meaning this test can be used) for the duration of the COVID-19 declaration under Section 564(b)(1) of the Act, 21 U.S.C. section 360bbb-3(b)(1), unless the authorization is terminated or revoked.  Performed at [facility], [address], [place], [place] [address]   Urine culture     Status: Abnormal   Collection Time: 07/30/20 11:42 AM   Specimen: Urine, Clean Catch  Result Value Ref Range Status   Specimen Description URINE, CLEAN CATCH  Final   Special Requests NONE  Final   Culture (A)  Final    <10,000 COLONIES/mL INSIGNIFICANT GROWTH Performed at [facility], [address] [place], [place] [address]    Report Status 07/31/2020 FINAL  Final  Blood Culture (routine x 2)     Status: None (Preliminary result)   Collection Time: 08/02/20  1:00 AM   Specimen: BLOOD  Result Value Ref Range Status   Specimen Description   Final    BLOOD BLOOD LEFT FOREARM Performed at [facility], [address] [place], [place] [address]    Special Requests   Final    BOTTLES DRAWN  AEROBIC AND ANAEROBIC Blood Culture results may not be optimal due to an inadequate volume of blood received in culture bottles Performed at [facility], [address], [place], [place] [address]    Culture   Final    NO GROWTH 1 DAY Performed at [facility], [address] [place], [place] [address]    Report Status PENDING  Incomplete  Blood Culture (routine x 2)     Status: None (Preliminary result)   Collection Time: 08/02/20  1:00 AM   Specimen: BLOOD  Result Value Ref Range Status   Specimen Description   Final    BLOOD BLOOD RIGHT FOREARM Performed at [facility], [address] [place], [place] [address]    Special Requests   Final    BOTTLES DRAWN AEROBIC AND ANAEROBIC Blood Culture adequate volume Performed at [facility], [address], [place], [place] [address]    Culture   Final    NO GROWTH 1 DAY Performed at [facility], [address] [place], [place] [address]    Report Status PENDING  Incomplete  Resp Panel by RT-PCR (Flu A&B, Covid) Nasopharyngeal Swab  Status: None   Collection Time: 08/02/20  2:32 AM   Specimen: Nasopharyngeal Swab; Nasopharyngeal(NP) swabs in vial transport medium  Result Value Ref Range Status   SARS Coronavirus 2 by RT PCR NEGATIVE NEGATIVE Final    Comment: (NOTE) SARS-CoV-2 target nucleic acids are NOT DETECTED.  The SARS-CoV-2 RNA is generally detectable in upper respiratory specimens during the acute phase of infection. The lowest concentration of SARS-CoV-2 viral copies this assay can detect is 138 copies/mL. A negative result does not preclude SARS-Cov-2 infection and should not be used as the sole basis for treatment or other patient management decisions. A negative result may occur with  improper specimen collection/handling, submission of specimen other than nasopharyngeal swab, presence of viral mutation(s) within the areas targeted by this  assay, and inadequate number of viral copies(<138 copies/mL). A negative result must be combined with clinical observations, patient history, and epidemiological information. The expected result is Negative.  Fact Sheet for Patients:  BloggerCourse.com  Fact Sheet for Healthcare Providers:  SeriousBroker.it  This test is no t yet approved or cleared by the [place] FDA and  has been authorized for detection and/or diagnosis of SARS-CoV-2 by FDA under an Emergency Use Authorization (EUA). This EUA will remain  in effect (meaning this test can be used) for the duration of the COVID-19 declaration under Section 564(b)(1) of the Act, 21 U.S.C.section 360bbb-3(b)(1), unless the authorization is terminated  or revoked sooner.       Influenza A by PCR NEGATIVE NEGATIVE Final   Influenza B by PCR NEGATIVE NEGATIVE Final    Comment: (NOTE) The Xpert Xpress SARS-CoV-2/FLU/RSV plus assay is intended as an aid in the diagnosis of influenza from Nasopharyngeal swab specimens and should not be used as a sole basis for treatment. Nasal washings and aspirates are unacceptable for Xpert Xpress SARS-CoV-2/FLU/RSV testing.  Fact Sheet for Patients: BloggerCourse.com  Fact Sheet for Healthcare Providers: SeriousBroker.it  This test is not yet approved or cleared by the [place] FDA and has been authorized for detection and/or diagnosis of SARS-CoV-2 by FDA under an Emergency Use Authorization (EUA). This EUA will remain in effect (meaning this test can be used) for the duration of the COVID-19 declaration under Section 564(b)(1) of the Act, 21 U.S.C. section 360bbb-3(b)(1), unless the authorization is terminated or revoked.  Performed at [facility], [address], [place], [place] [address]   Urine culture     Status: None   Collection Time: 08/02/20  5:40 AM    Specimen: In/Out Cath Urine  Result Value Ref Range Status   Specimen Description   Final    IN/OUT CATH URINE Performed at [facility], [address], [place], [place] [address]    Special Requests   Final    NONE Performed at [facility], [address], [place], [place] [address]    Culture   Final    NO GROWTH Performed at [facility], [address], [place], [place] [address]    Report Status 08/03/2020 FINAL  Final  MRSA PCR Screening     Status: None   Collection Time: 08/02/20  9:05 AM   Specimen: Nasopharyngeal  Result Value Ref Range Status   MRSA by PCR NEGATIVE NEGATIVE Final    Comment:        The GeneXpert MRSA Assay (FDA approved for NASAL specimens only), is one component of a comprehensive MRSA colonization surveillance program. It is not intended to diagnose  MRSA infection nor to guide or monitor treatment for MRSA infections. Performed at [facility], [address] [place], [place] [id number]     Radiology Studies: DG Chest 2 View  Result Date: 08/03/2020 CLINICAL DATA:  Respiratory failure. EXAM: CHEST - 2 VIEW COMPARISON:  CT chest and chest x-ray from yesterday. FINDINGS: The heart size and mediastinal contours are within normal limits. Normal pulmonary vascularity. Mild bibasilar atelectasis. No focal consolidation, pleural effusion, or pneumothorax. No acute osseous abnormality. IMPRESSION: 1. Mild bibasilar atelectasis. Electronically Signed   By: Obie DredgeWilliam T Derry M.D.   On: 08/03/2020 11:32     Nitika Jackowski T. Apollos Tenbrink [facility]  If 7PM-7AM, please contact night-coverage [email or web address] 08/03/2020, 1:45 PM

## 2020-08-03 NOTE — Evaluation (Signed)
Physical Therapy Evaluation Patient Details Name: Shirley Chen MRN: [id number] DOB: 12/02/1955 Today's Date: 08/03/2020   History of Present Illness  Pt is 65 yo female who presented to ED on 08/01/20 with shortness of breath and admitted with acute resp failure with hypoxia - cause not clear.  Ddimer was elevated but dopplers and VQ scan were negative for clots.  Pt with PMH of DM-2, HTN, CKD-3, PVD, HLD and tobacco abuse  Clinical Impression  Pt admitted with above diagnosis. Pt very motivated to start moving and improve mobility.  At baseline, she utilized rollator with some decreased endurance but was still able to ambulate in community.  She has chronic back - "sciatic" pain and bil foot pain from neuropathy. She is not on home O2.  Today, she was on 9L O2 rest and 10L O2 with activity with O2 sats 96% or > during session.  Pt ambulate 82' with RW and DOE of 3/4.  She has good support at home and is expected to progress well. Pt currently with functional limitations due to the deficits listed below (see PT Problem List). Pt will benefit from skilled PT to increase their independence and safety with mobility to allow discharge to the venue listed below.       Follow Up Recommendations Home health PT;Supervision for mobility/OOB    Equipment Recommendations  Other (comment) (shower chair)    Recommendations for Other Services       Precautions / Restrictions Precautions Precautions: Fall Precaution Comments: watch sats      Mobility  Bed Mobility               General bed mobility comments: sitting EOB at arrival    Transfers Overall transfer level: Needs assistance Equipment used: Rolling walker (2 wheeled) Transfers: Sit to/from [employer] Transfers Sit to Stand: Supervision Stand pivot transfers: Supervision       General transfer comment: Sit to stand x 3 throughout session - relies on pushing up and momentum  Ambulation/Gait Ambulation/Gait  assistance: Min guard Gait Distance (Feet): 16 Feet Assistive device: Rolling walker (2 wheeled) Gait Pattern/deviations: Step-to pattern;Decreased stride length;Wide base of support;Antalgic;Trunk flexed Gait velocity: decreased   General Gait Details: cues for RW proximity; ambulates with antalgic pattern but reports baseline from neuropathy and sciatica  Stairs            Wheelchair Mobility    Modified Rankin (Stroke Patients Only)       Balance Overall balance assessment: Needs assistance Sitting-balance support: No upper extremity supported Sitting balance-Leahy Scale: Good       Standing balance-Leahy Scale: Fair Standing balance comment: Could stand for ADLs wtihout support but prefers at least single UE support and RW for ambulation                             Pertinent Vitals/Pain Pain Assessment: 0-10 Pain Score:  (10/10 when it "jabs" but 0/10 if not "jabbing") Pain Location: feet - neuropathy, not constant Pain Descriptors / Indicators: Shooting Pain Intervention(s): Limited activity within patient's tolerance;Monitored during session;Repositioned    Home Living Family/patient expects to be discharged to:: Private residence Living Arrangements: Other (Comment) (son in law and grand child) Available Help at Discharge: Family;Available PRN/intermittently (son in law works - looks into getting some friends that might could help) Type of Home: Apartment Home Access: Level entry     Home Layout: One level Home Equipment: Grab bars - tub/shower;Walker -  2 wheels;Walker - 4 wheels      Prior Function     Gait / Transfers Assistance Needed: Able to ambulate using rollator due to decreased endurance; on a normal day could ambulate in community without rest breaks  ADL's / Homemaking Assistance Needed: Pt independent with ADLs and IADLs but reports difficulty due to fatigue  Comments: Pt is not on home O2 at this time     Hand Dominance    Dominant Hand: Right    Extremity/Trunk Assessment   Upper Extremity Assessment Upper Extremity Assessment: Overall WFL for tasks assessed    Lower Extremity Assessment Lower Extremity Assessment: LLE deficits/detail;RLE deficits/detail RLE Deficits / Details: + edema, +peripheral neuropathy with chronic pain that limited ankle ROM and MMT - however, ROM was functional and at least 3/5 strength throughout LLE Deficits / Details: + edema, +peripheral neuropathy with chronic pain that limited ankle ROM and MMT - however, ROM was functional and at least 3/5 strength throughout    Cervical / Trunk Assessment Cervical / Trunk Assessment: Normal  Communication   Communication: No difficulties  Cognition Arousal/Alertness: Awake/alert Behavior During Therapy: WFL for tasks assessed/performed Overall Cognitive Status: Within Functional Limits for tasks assessed                                        General Comments General comments (skin integrity, edema, etc.): Pt on 9 L HFNC at rest with sats 96%. Placed on 10 L for ambulation as 9 not option on O2 tanks with sats 97% ambulation.  Returned back to 9 L with sats 96%.  Notified RN pt's O2 sats stable    Exercises     Assessment/Plan    PT Assessment Patient needs continued PT services  PT Problem List Decreased strength;Decreased mobility;Decreased activity tolerance;Decreased range of motion;Cardiopulmonary status limiting activity;Decreased balance;Decreased knowledge of use of DME;Pain       PT Treatment Interventions DME instruction;Therapeutic activities;Gait training;Therapeutic exercise;Patient/family education;Balance training;Functional mobility training;Modalities    PT Goals (Current goals can be found in the Care Plan section)  Acute Rehab PT Goals Patient Stated Goal: breath better; return home; eager to walk and get better PT Goal Formulation: With patient Time For Goal Achievement:  08/17/20 Potential to Achieve Goals: Good    Frequency Min 3X/week   Barriers to discharge        Co-evaluation               AM-PAC PT "6 Clicks" Mobility  Outcome Measure Help needed turning from your back to your side while in a flat bed without using bedrails?: A Little Help needed moving from lying on your back to sitting on the side of a flat bed without using bedrails?: A Little Help needed moving to and from a bed to a chair (including a wheelchair)?: A Little Help needed standing up from a chair using your arms (e.g., wheelchair or bedside chair)?: A Little Help needed to walk in hospital room?: A Little Help needed climbing 3-5 steps with a railing? : A Little 6 Click Score: 18    End of Session Equipment Utilized During Treatment: Gait belt;Oxygen Activity Tolerance: Patient tolerated treatment well Patient left: with chair alarm set;in chair;with call bell/phone within reach Nurse Communication: Mobility status PT Visit Diagnosis: Other abnormalities of gait and mobility (R26.89);Pain Pain - part of body:  (back and bil feet)    Time:  5465-6812 (Out of room [phone] due to called to assist another pt.  37 mins spent with Shirley Chen.) PT Time Calculation (min) (ACUTE ONLY): 65 min   Charges:   PT Evaluation $PT Eval Moderate Complexity: 1 Mod PT Treatments $Therapeutic Activity: 8-22 mins        Anise Salvo, PT Acute Rehab Services Pager [phone] [facility] [phone]    Rayetta Humphrey 08/03/2020, 1:26 PM

## 2020-08-04 ENCOUNTER — Inpatient Hospital Stay (HOSPITAL_COMMUNITY): Payer: Medicare Other | Admitting: Certified Registered"

## 2020-08-04 ENCOUNTER — Inpatient Hospital Stay (HOSPITAL_COMMUNITY): Payer: Medicare Other

## 2020-08-04 ENCOUNTER — Encounter (HOSPITAL_COMMUNITY)
Admission: EM | Disposition: A | Discharge status: Skilled Nursing Facility | Payer: Self-pay | Source: Home / Self Care | Attending: Student

## 2020-08-04 ENCOUNTER — Encounter (HOSPITAL_COMMUNITY): Payer: Medicare Other

## 2020-08-04 DIAGNOSIS — J441 Chronic obstructive pulmonary disease with (acute) exacerbation: Secondary | ICD-10-CM | POA: Diagnosis not present

## 2020-08-04 DIAGNOSIS — E1151 Type 2 diabetes mellitus with diabetic peripheral angiopathy without gangrene: Secondary | ICD-10-CM | POA: Diagnosis not present

## 2020-08-04 DIAGNOSIS — M726 Necrotizing fasciitis: Secondary | ICD-10-CM | POA: Diagnosis not present

## 2020-08-04 DIAGNOSIS — J9601 Acute respiratory failure with hypoxia: Secondary | ICD-10-CM | POA: Diagnosis not present

## 2020-08-04 DIAGNOSIS — I151 Hypertension secondary to other renal disorders: Secondary | ICD-10-CM | POA: Diagnosis not present

## 2020-08-04 DIAGNOSIS — N179 Acute kidney failure, unspecified: Secondary | ICD-10-CM | POA: Diagnosis not present

## 2020-08-04 DIAGNOSIS — R0609 Other forms of dyspnea: Secondary | ICD-10-CM | POA: Diagnosis not present

## 2020-08-04 HISTORY — PX: I & D EXTREMITY: SHX5045

## 2020-08-04 LAB — RENAL FUNCTION PANEL
Albumin: 2.4 g/dL — ABNORMAL LOW (ref 3.5–5.0)
Anion gap: 10 (ref 5–15)
BUN: 64 mg/dL — ABNORMAL HIGH (ref 8–23)
CO2: 20 mmol/L — ABNORMAL LOW (ref 22–32)
Calcium: 8.8 mg/dL — ABNORMAL LOW (ref 8.9–10.3)
Chloride: 115 mmol/L — ABNORMAL HIGH (ref 98–111)
Creatinine, Ser: 1.9 mg/dL — ABNORMAL HIGH (ref 0.44–1.00)
GFR, Estimated: 29 mL/min — ABNORMAL LOW (ref >60)
Glucose, Bld: 126 mg/dL — ABNORMAL HIGH (ref 70–99)
Phosphorus: 4.2 mg/dL (ref 2.5–4.6)
Potassium: 3.9 mmol/L (ref 3.5–5.1)
Sodium: 145 mmol/L (ref 135–145)

## 2020-08-04 LAB — CBC WITH DIFFERENTIAL/PLATELET
Abs Immature Granulocytes: 0.47 10*3/uL — ABNORMAL HIGH (ref 0.00–0.07)
Basophils Absolute: 0 10*3/uL (ref 0.0–0.1)
Basophils Relative: 0 %
Eosinophils Absolute: 0.1 10*3/uL (ref 0.0–0.5)
Eosinophils Relative: 0 %
HCT: 25.4 % — ABNORMAL LOW (ref 36.0–46.0)
Hemoglobin: 7.9 g/dL — ABNORMAL LOW (ref 12.0–15.0)
Immature Granulocytes: 3 %
Lymphocytes Relative: 5 %
Lymphs Abs: 0.9 10*3/uL (ref 0.7–4.0)
MCH: 30.2 pg (ref 26.0–34.0)
MCHC: 31.1 g/dL (ref 30.0–36.0)
MCV: 96.9 fL (ref 80.0–100.0)
Monocytes Absolute: 1.3 10*3/uL — ABNORMAL HIGH (ref 0.1–1.0)
Monocytes Relative: 7 %
Neutro Abs: 16.1 10*3/uL — ABNORMAL HIGH (ref 1.7–7.7)
Neutrophils Relative %: 85 %
Platelets: 302 10*3/uL (ref 150–400)
RBC: 2.62 MIL/uL — ABNORMAL LOW (ref 3.87–5.11)
RDW: 14.3 % (ref 11.5–15.5)
WBC: 18.8 10*3/uL — ABNORMAL HIGH (ref 4.0–10.5)
nRBC: 0.1 % (ref 0.0–0.2)

## 2020-08-04 LAB — HEPATIC FUNCTION PANEL
ALT: 17 U/L (ref 0–44)
AST: 16 U/L (ref 15–41)
Albumin: 2.4 g/dL — ABNORMAL LOW (ref 3.5–5.0)
Alkaline Phosphatase: 131 U/L — ABNORMAL HIGH (ref 38–126)
Bilirubin, Direct: 0.2 mg/dL (ref 0.0–0.2)
Indirect Bilirubin: 0.8 mg/dL (ref 0.3–0.9)
Total Bilirubin: 1 mg/dL (ref 0.3–1.2)
Total Protein: 6 g/dL — ABNORMAL LOW (ref 6.5–8.1)

## 2020-08-04 LAB — BLOOD GAS, ARTERIAL
Acid-base deficit: 6 mmol/L — ABNORMAL HIGH (ref 0.0–2.0)
Bicarbonate: 19.3 mmol/L — ABNORMAL LOW (ref 20.0–28.0)
O2 Saturation: 93.4 %
Patient temperature: 98.6
pCO2 arterial: 40.1 mmHg (ref 32.0–48.0)
pH, Arterial: 7.305 — ABNORMAL LOW (ref 7.350–7.450)
pO2, Arterial: 72.7 mmHg — ABNORMAL LOW (ref 83.0–108.0)

## 2020-08-04 LAB — CULTURE, BLOOD (ROUTINE X 2)
Culture: NO GROWTH
Culture: NO GROWTH
Special Requests: ADEQUATE
Special Requests: ADEQUATE

## 2020-08-04 LAB — GLUCOSE, CAPILLARY
Glucose-Capillary: 122 mg/dL — ABNORMAL HIGH (ref 70–99)
Glucose-Capillary: 123 mg/dL — ABNORMAL HIGH (ref 70–99)
Glucose-Capillary: 111 mg/dL — ABNORMAL HIGH (ref 70–99)
Glucose-Capillary: 147 mg/dL — ABNORMAL HIGH (ref 70–99)
Glucose-Capillary: 142 mg/dL — ABNORMAL HIGH (ref 70–99)
Glucose-Capillary: 298 mg/dL — ABNORMAL HIGH (ref 70–99)
Glucose-Capillary: 144 mg/dL — ABNORMAL HIGH (ref 70–99)

## 2020-08-04 LAB — ECHOCARDIOGRAM COMPLETE
Area-P 1/2: 3.21 cm2
Height: 63 in
S' Lateral: 3.5 cm
Weight: 3858.93 oz

## 2020-08-04 LAB — HEMOGLOBIN AND HEMATOCRIT, BLOOD
HCT: 28.9 % — ABNORMAL LOW (ref 36.0–46.0)
Hemoglobin: 9.1 g/dL — ABNORMAL LOW (ref 12.0–15.0)

## 2020-08-04 LAB — LACTIC ACID, PLASMA
Lactic Acid, Venous: 0.7 mmol/L (ref 0.5–1.9)
Lactic Acid, Venous: 0.6 mmol/L (ref 0.5–1.9)

## 2020-08-04 LAB — SEDIMENTATION RATE: Sed Rate: 110 mm/hr — ABNORMAL HIGH (ref 0–22)

## 2020-08-04 LAB — PREPARE RBC (CROSSMATCH)

## 2020-08-04 LAB — CK: Total CK: 46 U/L (ref 38–234)

## 2020-08-04 LAB — MAGNESIUM: Magnesium: 2.3 mg/dL (ref 1.7–2.4)

## 2020-08-04 LAB — PROCALCITONIN: Procalcitonin: 0.5 ng/mL

## 2020-08-04 LAB — C-REACTIVE PROTEIN: CRP: 33.3 mg/dL — ABNORMAL HIGH (ref <1.0)

## 2020-08-04 SURGERY — IRRIGATION AND DEBRIDEMENT EXTREMITY
Anesthesia: General | Site: Foot | Laterality: Left

## 2020-08-04 MED ORDER — LACTATED RINGERS IV SOLN: INTRAVENOUS | Status: DC | PRN

## 2020-08-04 MED ORDER — FENTANYL CITRATE (PF) 100 MCG/2ML IJ SOLN
INTRAMUSCULAR | Status: AC
  Filled 2020-08-04: qty 2

## 2020-08-04 MED ORDER — ATORVASTATIN CALCIUM 40 MG PO TABS
40.0000 mg | ORAL_TABLET | Freq: Every day | ORAL | Status: DC
  Administered 2020-08-05 – 2020-08-11 (×7): 40 mg via ORAL
  Filled 2020-08-04 (×7): qty 1

## 2020-08-04 MED ORDER — PROPOFOL 10 MG/ML IV BOLUS
INTRAVENOUS | Status: AC
  Filled 2020-08-04: qty 20

## 2020-08-04 MED ORDER — MIDAZOLAM HCL 2 MG/2ML IJ SOLN
INTRAMUSCULAR | Status: AC
  Filled 2020-08-04: qty 2

## 2020-08-04 MED ORDER — 0.9 % SODIUM CHLORIDE (POUR BTL) OPTIME
TOPICAL | Status: DC | PRN
  Administered 2020-08-04: 3000 mL

## 2020-08-04 MED ORDER — DEXAMETHASONE SODIUM PHOSPHATE 10 MG/ML IJ SOLN
INTRAMUSCULAR | Status: AC
  Filled 2020-08-04: qty 1

## 2020-08-04 MED ORDER — SODIUM CHLORIDE 0.9% IV SOLUTION: Freq: Once | INTRAVENOUS | Status: DC

## 2020-08-04 MED ORDER — FENTANYL CITRATE (PF) 100 MCG/2ML IJ SOLN: 25.0000 ug | INTRAMUSCULAR | Status: DC | PRN

## 2020-08-04 MED ORDER — SODIUM CHLORIDE 0.9 % IV SOLN
8.0000 mg/kg | Freq: Every day | INTRAVENOUS | Status: DC
  Filled 2020-08-04: qty 12

## 2020-08-04 MED ORDER — KETAMINE HCL 10 MG/ML IJ SOLN
INTRAMUSCULAR | Status: AC
  Filled 2020-08-04: qty 1

## 2020-08-04 MED ORDER — ONDANSETRON HCL 4 MG/2ML IJ SOLN
INTRAMUSCULAR | Status: AC
  Filled 2020-08-04: qty 2

## 2020-08-04 MED ORDER — COLCHICINE 0.6 MG PO TABS
0.6000 mg | ORAL_TABLET | Freq: Every day | ORAL | Status: DC
  Administered 2020-08-04 – 2020-08-11 (×8): 0.6 mg via ORAL
  Filled 2020-08-04 (×9): qty 1

## 2020-08-04 MED ORDER — ONDANSETRON HCL 4 MG/2ML IJ SOLN: 4.0000 mg | Freq: Once | INTRAMUSCULAR | Status: DC | PRN

## 2020-08-04 MED ORDER — ATORVASTATIN CALCIUM 40 MG PO TABS: 40.0000 mg | ORAL_TABLET | Freq: Every day | ORAL | Status: DC

## 2020-08-04 MED ORDER — BUPIVACAINE HCL (PF) 0.5 % IJ SOLN
INTRAMUSCULAR | Status: AC
  Filled 2020-08-04: qty 30

## 2020-08-04 MED ORDER — LIDOCAINE HCL 2 % IJ SOLN
INTRAMUSCULAR | Status: AC
  Filled 2020-08-04: qty 20

## 2020-08-04 MED ORDER — BUPIVACAINE HCL (PF) 0.5 % IJ SOLN
INTRAMUSCULAR | Status: DC | PRN
  Administered 2020-08-04: 20 mL

## 2020-08-04 MED ORDER — PHENYLEPHRINE HCL-NACL 10-0.9 MG/250ML-% IV SOLN: INTRAVENOUS | Status: DC | PRN

## 2020-08-04 MED ORDER — KETAMINE HCL 10 MG/ML IJ SOLN
INTRAMUSCULAR | Status: DC | PRN
  Administered 2020-08-04 (×6): 20 mg via INTRAVENOUS

## 2020-08-04 MED ORDER — FUROSEMIDE 10 MG/ML IJ SOLN
40.0000 mg | Freq: Once | INTRAMUSCULAR | Status: AC
  Administered 2020-08-04: 40 mg via INTRAVENOUS
  Filled 2020-08-04: qty 4

## 2020-08-04 MED ORDER — LIDOCAINE 2% (20 MG/ML) 5 ML SYRINGE
INTRAMUSCULAR | Status: AC
  Filled 2020-08-04: qty 5

## 2020-08-04 MED ORDER — ALLOPURINOL 300 MG PO TABS
300.0000 mg | ORAL_TABLET | Freq: Every day | ORAL | Status: DC
  Administered 2020-08-04: 300 mg via ORAL
  Filled 2020-08-04: qty 1

## 2020-08-04 MED ORDER — LINEZOLID 600 MG/300ML IV SOLN
600.0000 mg | Freq: Two times a day (BID) | INTRAVENOUS | Status: DC
  Administered 2020-08-04 – 2020-08-11 (×13): 600 mg via INTRAVENOUS
  Filled 2020-08-04 (×15): qty 300

## 2020-08-04 SURGICAL SUPPLY — 44 items
BNDG ELASTIC 3X5.8 VLCR STR LF (GAUZE/BANDAGES/DRESSINGS) IMPLANT
BNDG ELASTIC 4X5.8 VLCR STR LF (GAUZE/BANDAGES/DRESSINGS) ×2 IMPLANT
BNDG ESMARK 4X9 LF (GAUZE/BANDAGES/DRESSINGS) IMPLANT
BNDG GAUZE ELAST 4 BULKY (GAUZE/BANDAGES/DRESSINGS) ×2 IMPLANT
CHLORAPREP W/TINT 26 (MISCELLANEOUS) ×2 IMPLANT
COVER MAYO STAND STRL (DRAPES) ×2 IMPLANT
COVER WAND RF STERILE (DRAPES) IMPLANT
CUFF TOURN SGL QUICK 34 (TOURNIQUET CUFF)
CUFF TRNQT CYL 34X4.125X (TOURNIQUET CUFF) IMPLANT
DRAPE IMP U-DRAPE 54X76 (DRAPES) ×2 IMPLANT
DRAPE SURG 17X23 STRL (DRAPES) IMPLANT
DRAPE U-SHAPE 47X51 STRL (DRAPES) ×2 IMPLANT
DRSG EMULSION OIL 3X3 NADH (GAUZE/BANDAGES/DRESSINGS) ×2 IMPLANT
DRSG PAD ABDOMINAL 8X10 ST (GAUZE/BANDAGES/DRESSINGS) ×2 IMPLANT
GAUZE PACKING IODOFORM 1X5 (PACKING) ×2 IMPLANT
GAUZE SPONGE 4X4 12PLY STRL (GAUZE/BANDAGES/DRESSINGS) ×2 IMPLANT
GLOVE SURG ENC MOIS LTX SZ7 (GLOVE) ×2 IMPLANT
GLOVE SURG UNDER POLY LF SZ7.5 (GLOVE) ×2 IMPLANT
GOWN STRL REUS W/ TWL LRG LVL3 (GOWN DISPOSABLE) ×1 IMPLANT
GOWN STRL REUS W/ TWL XL LVL3 (GOWN DISPOSABLE) IMPLANT
GOWN STRL REUS W/TWL LRG LVL3 (GOWN DISPOSABLE) ×1
GOWN STRL REUS W/TWL XL LVL3 (GOWN DISPOSABLE)
HANDPIECE INTERPULSE COAX TIP (DISPOSABLE) ×1
KIT BASIN OR (CUSTOM PROCEDURE TRAY) ×2 IMPLANT
NDL SAFETY ECLIPSE 18X1.5 (NEEDLE) IMPLANT
NEEDLE BIOPSY JAMSHIDI 11X6 (NEEDLE) IMPLANT
NEEDLE HYPO 18GX1.5 SHARP (NEEDLE)
NEEDLE HYPO 25X1 1.5 SAFETY (NEEDLE) ×2 IMPLANT
NS IRRIG 1000ML POUR BTL (IV SOLUTION) IMPLANT
PACK ORTHO EXTREMITY (CUSTOM PROCEDURE TRAY) ×2 IMPLANT
PADDING CAST ABS 4INX4YD NS (CAST SUPPLIES) ×2
PADDING CAST ABS COTTON 4X4 ST (CAST SUPPLIES) ×2 IMPLANT
PENCIL SMOKE EVACUATOR (MISCELLANEOUS) ×2 IMPLANT
SET HNDPC FAN SPRY TIP SCT (DISPOSABLE) ×1 IMPLANT
STAPLER VISISTAT 35W (STAPLE) IMPLANT
STOCKINETTE 6  STRL (DRAPES)
STOCKINETTE 6 STRL (DRAPES) IMPLANT
SUT MNCRL AB 3-0 PS2 18 (SUTURE) IMPLANT
SUT MNCRL AB 4-0 PS2 18 (SUTURE) IMPLANT
SUT MON AB 5-0 PS2 18 (SUTURE) IMPLANT
SUT PROLENE 4 0 PS 2 18 (SUTURE) IMPLANT
SUT VIC AB 4-0 P2 18 (SUTURE) IMPLANT
SYR CONTROL 10ML LL (SYRINGE) IMPLANT
UNDERPAD 30X36 HEAVY ABSORB (UNDERPADS AND DIAPERS) ×2 IMPLANT

## 2020-08-04 NOTE — OR Nursing (Signed)
Pt received from OR to PACU on NRB mask at 10L. PRBCs running at 150cc/hr with warmer in place. VSS. CBG- 146. Will continue to monitor.

## 2020-08-04 NOTE — Progress Notes (Signed)
PT is complaining of severe back pain and nausea- RN is aware. PT states her breathing is good.

## 2020-08-04 NOTE — Consult Note (Signed)
  Subjective:  Patient ID: Shirley Chen, female    DOB: 17-Jul-1955,  MRN: [id number]  A 65 y.o. female presents withPMH of DM-2, HTN, CKD-3, PVD, HLD and tobacco abuse presenting with progressive shortness of breath, lower extremity edema and nausea and admitted with SIRS, acute respiratory failure with hypoxia and elevated D-dimer now with concern for left foot abscess/necrotizing fasciitis.  Unfortunately patient is in altered condition secondary to likely foot infection.  MRI showed necrotizing fasciitis/abscess formation to the left foot.  Patient is alert to time place or oriented.  I reached out to emergency contact Shirley Chen and made the decision and discussed the procedure with him in extensive detail. Objective:   Vitals:   08/04/20 1421 08/04/20 1614  BP:  140/66  Pulse: 68 72  Resp:  15  Temp:  99 F (37.2 C)  SpO2: 91% 92%         General AA&O x3. Normal mood and affect.  Vascular Dorsalis pedis and posterior tibial pulses 2/4 bilat. Brisk capillary refill to all digits. Pedal hair present.  Neurologic Epicritic sensation grossly intact.  Dermatologic  dorsal midfoot Crepitus as well as plantar crepitus.  Cellulitis up to the level of the ankle joint.  Cap refill intact.  No open wound or lesion noted.  Orthopedic: MMT 5/5 in dorsiflexion, plantarflexion, inversion, and eversion. Normal joint ROM without pain or crepitus.    Assessment & Plan:  Patient was evaluated and treated and all questions answered.  Left foot abscess/gas gangrene/necrotizing fasciitis with underlying diabetes -All questions and concerns were directly addressed with the patient's emergency contact Shirley Chen -Clinically patient has a severe concern for necrotizing fasciitis given the clinical picture.  At this time I believe patient will benefit from emergency incision and drainage washout debridement with possible packed open.  I discussed all my findings and clinical concerns with Shirley Chen. -She is a high risk of losing the foot versus below the knee amputation given her A1c is uncontrolled with last being at 8.0%. -She will need to be nonweightbearing to the left lower extremity -Continue IV antibiotics and I will plan on taking cultures in the operating room to help narrow down the antibiotics. -Decision was made to the take the patient to the operating room emergently as patient's white count is elevated as well as becoming systemically ill. Candelaria Stagers, DPM  Accessible via secure chat for questions or concerns.

## 2020-08-04 NOTE — Progress Notes (Addendum)
PROGRESS NOTE  Avalynne Diver TIW:[id number] DOB: 10-10-1955   PCP: Marrian Salvage, FNP  Patient is from: Home  DOA: 08/01/2020 LOS: 2  Chief complaints: Shortness of breath  Brief Narrative / Interim history: 65 year old F with PMH of DM-2, HTN, CKD-3, PVD, HLD and tobacco abuse presenting with progressive shortness of breath, lower extremity edema and nausea and admitted with SIRS, acute respiratory failure with hypoxia and elevated D-dimer.  She was empirically started on broad-spectrum antibiotics and IV heparin pending cultures, VQ scan and LE [place].  CT chest without contrast without acute finding.  She also had mildly elevated troponin and BNP.   VQ scan with low probability for central PE.  LE [place] negative for DVT.  Heparin discontinued.  She continues to have significant O2 requirement.   Noted to have left foot swelling and erythema on 4/30.  MRI foot with severe soft edema throughout the foot with area and dorsal aspect of the forefoot tracking proximally to the level of the TMT joint most concerning for necrotizing infection and possible reactive marrow edema  Vs osteomyelitis within the third proximal phalanx.  Added IV daptomycin.  Podiatry, Dr. Posey Pronto consulted and recommended n.p.o. after midnight for surgery on 5/1.  PCCM, Dr. Elsworth Soho reviewed the chart and had no additional recommendation from respiratory standpoint.  Subjective: Seen and examined earlier this morning.  Rapid response called overnight due to mental status change.  She was oriented to self and place only which was a change from her baseline.  She looks awake this morning but only oriented to self and place.  She responds "I do not know" when asked about chest pain or her breathing.  She complains foot pain that she attributes to her neuropathy.  Objective: Vitals:   08/04/20 0352 08/04/20 0913 08/04/20 0921 08/04/20 1213  BP: 137/68  (!) 136/108 (!) 130/56  Pulse: 74 70 70 63  Resp: '18 20  12  ' Temp: 100 F  (37.8 C)  99.6 F (37.6 C) 98.4 F (36.9 C)  TempSrc: Oral  Oral Oral  SpO2: 92% 93% 90% 91%  Weight:      Height:        Intake/Output Summary (Last 24 hours) at 08/04/2020 1223 Last data filed at 08/04/2020 1033 Gross per 24 hour  Intake 920 ml  Output 950 ml  Net -30 ml   Filed Weights   08/01/20 2345 08/03/20 0418  Weight: 108.9 kg 109.4 kg    Examination:  GENERAL: No apparent distress.  Nontoxic. HEENT: MMM.  Vision and hearing grossly intact.  NECK: Supple.  No apparent JVD.  RESP: 88% on 6 L.  No IWOB.  Fair aeration bilaterally. CVS:  RRR. Heart sounds normal.  ABD/GI/GU: BS+. Abd soft, NTND.  MSK/EXT:  Moves extremities.  Erythema and swelling in left foot with what looks like an abscess over dorsal and plantar aspect.  Tender to palpation.  I was not able to palpate DP pulses.  SKIN: no apparent skin lesion or wound NEURO: Awake but not quite alert.  Oriented to self and place.  No apparent focal neuro deficit. PSYCH: Calm. Normal affect.    Procedures:  None  Microbiology summarized: [id number] and influenza PCR nonreactive. MRSA PCR negative. Blood and urine cultures NGTD  Assessment & Plan: Acute respiratory failure with hypoxia due to possible community-acquired pneumonia: Continues to require 6 L to maintain saturation in the low 90s.  Patient is not on oxygen at baseline. CT chest and CXR negative for pneumonia  or significant CHF.  D-dimer elevated but VQ scan and LE [place] negative.  BNP and troponin marginally elevated as well but she has no chest pain or clinical evidence of fluid overload.  ABG with mild metabolic acidosis. -Continue cefepime, Flagyl and azithromycin -Added daptomycin in the setting of possible neck fasciitis to left foot-will discuss with ID. -IV Lasix 40 mg x1 -Wean oxygen as able. -Incentive spirometry/OOB/PT/OT -Discussed with PCCM, Dr. Elsworth Soho.  No additional recommendation. -Follow TTE  Severe sepsis due to possible CAP and left  foot neck fasciitis: POA: Has leukocytosis, tachycardia, tachypnea, AKI, respiratory failure and hypotension.  UA with nitrite and > 300 proteins.  Cultures and MRSA PCR negative.  Now concern for neck fasciitis to left foot.  CRP and ESR elevated.  Leukocytosis seems to be improving. -Antibiotics as above -Podiatry, Dr. Posey Pronto consulted and recommended n.p.o. after midnight for surgery in the morning  Addendum 1:20 PM: Discussed with ID, Dr. Gale Journey who recommended changing daptomycin to linezolid. DR. Vu will see patient.  3/30 p.m.  Went to check on patient.  Still sleepy but awakes to voice.  Oriented to self and place.  Hemodynamically stable.  Saturating in low 90s on 6 L. Left foot now with visually notable abscess over dorsal and plantar aspect but no significant proximal extension.  Discussed with PCCM, Dr. Elsworth Soho who just saw patient and recommended surgical debridement ASAP.  Discussed this with Dr. Posey Pronto the podiatrist on-call who will take patient to OR this evening.  Patient has not eaten today.  Per RN, she only had sips of water about 11:30 AM this morning.  Made patient n.p.o.  Of note, I have also discussed  AKI on CKD-3B/azotemia: Baseline Cr 1.1-1.2.  AKI likely in the setting of possible sepsis and nephrotoxic meds (NSAIDs and ACE inhibitor's). FENa 1.6% suggesting intrinsic process.  UPC 0.49.  Improving Recent Labs    02/02/20 0811 06/21/20 0835 07/29/20 2255 08/01/20 0041 08/02/20 0559 08/03/20 0411 08/04/20 0335  BUN 23 20 25* 46* 46* 56* 64*  CREATININE 1.34* 1.19 1.54* 2.13* 2.27* 2.15* 1.90*  -Recheck renal function in the morning -Continue holding ACE inhibitor's. -Check renal ultrasound  Acute metabolic encephalopathy: Could be from sepsis or iatrogenic from pain medications.  She is awake and oriented to self and place.  No hypercapnia on VBG -Reorientation and delirium precautions  Possible PAD: I was not able to palpate DP pulse -ABI  Elevated  troponin/BNP: Demand ischemia.  Patient has no chest pain.  No prior history of CHF. -IV Lasix as above -Follow TTE  Hypernatremia: Na 147> 145.   -Recheck in the morning  Uncontrolled IDDM-2 with hyperglycemia, CKD and peripheral neuropathy: A1c 8.0% on 06/21/2020.  On Humalog 75/25 at 30 units twice daily, metformin Recent Labs  Lab 08/03/20 1629 08/03/20 2026 08/04/20 0341 08/04/20 0724 08/04/20 1216  GLUCAP 166* 134* 122* 123* 111*  -Continue SSI-moderate -Continue atorvastatin, Zetia and gabapentin.  Proteinuria: UA with > 300 proteins.  UPC 0.5 arguing against nephrotic syndrome. -Resume ACE inhibitor's once renal function improves  Leukocytosis/bandemia: WBC 25>>18. -Antibiotics as above -Continue monitoring  Hyperbilirubinemia: Resolved. -Recheck in the morning  Hypotension in patient with history of hypertension: Likely from possible sepsis.  Hypotension resolved. -Continue home Coreg -Continue holding amlodipine, lisinopril  Normocytic anemia: Drop in Hgb likely dilutional.  She denies melena or hematochezia. Recent Labs    02/02/20 0811 06/21/20 0835 07/29/20 2255 08/02/20 0041 08/02/20 0559 08/03/20 0411 08/04/20 0335  HGB 11.8* 11.5* 10.2*  8.2* 8.4* 7.7* 7.9*  -Transfuse 1 unit of blood for upcoming surgery.  Verbally consented for blood transfusion. -Continue monitoring -Recheck H&H in the morning  Tobacco use disorder: Smokes about half a pack a day. -Encourage tobacco cessation -She is not interested in nicotine patch.  Chronic back pain-recent MRI of T and L-spine with moderate sized rightward disc extrusion at L4-5 with severe lateral recess stenosis and moderate multifactorial bilateral L4 neuroforaminal stenosis.  No red flags -As needed Tylenol, tramadol and oxycodone based on pain scale -Outpatient follow-up -PT/OT eval   Hyperuricemia: Due to renal failure?  Gout? -Start colchicine  Morbid obesity Body mass index is 42.72 kg/m.          DVT prophylaxis:  On subcu heparin  Code Status: Full code Family Communication: Patient and/or RN. Available if any question.  Level of care: Progressive Status is: Inpatient  Remains inpatient appropriate because:Persistent severe electrolyte disturbances, Altered mental status, Ongoing diagnostic testing needed not appropriate for outpatient work up, IV treatments appropriate due to intensity of illness or inability to take PO and Inpatient level of care appropriate due to severity of illness   Dispo: The patient is from: Home              Anticipated d/c is to: Home              Patient currently is not medically stable to d/c.   Difficult to place patient No       Consultants:  None   Sch Meds:  Scheduled Meds: . atorvastatin  40 mg Oral Daily  . azithromycin  500 mg Oral Daily  . carvedilol  25 mg Oral BID WC  . colchicine  0.6 mg Oral Daily  . ezetimibe  10 mg Oral Daily  . gabapentin  300 mg Oral BID  . heparin  5,000 Units Subcutaneous Q8H  . insulin aspart  0-15 Units Subcutaneous TID WC  . insulin aspart  0-5 Units Subcutaneous QHS   Continuous Infusions: . ceFEPime (MAXIPIME) IV 2 g (08/04/20 0219)  . metronidazole 500 mg (08/04/20 1149)   PRN Meds:.acetaminophen **OR** acetaminophen, oxyCODONE, traMADol  Antimicrobials: Anti-infectives (From admission, onward)   Start     Dose/Rate Route Frequency Ordered Stop   08/04/20 0300  vancomycin (VANCOREADY) IVPB 1250 mg/250 mL  Status:  Discontinued        1,250 mg 166.7 mL/hr over 90 Minutes Intravenous Every 48 hours 08/02/20 0508 08/02/20 1115   08/03/20 1300  azithromycin (ZITHROMAX) tablet 500 mg        500 mg Oral Daily 08/03/20 1203 08/08/20 0959   08/02/20 1900  metroNIDAZOLE (FLAGYL) IVPB 500 mg        500 mg 100 mL/hr over 60 Minutes Intravenous Every 8 hours 08/02/20 1059     08/02/20 1400  ceFEPIme (MAXIPIME) 2 g in sodium chloride 0.9 % 100 mL IVPB        2 g 200 mL/hr over 30 Minutes  Intravenous Every 12 hours 08/02/20 0502     08/02/20 1100  metroNIDAZOLE (FLAGYL) IVPB 500 mg  Status:  Discontinued        500 mg 100 mL/hr over 60 Minutes Intravenous Every 8 hours 08/02/20 0500 08/02/20 1100   08/02/20 0515  ceFEPIme (MAXIPIME) 2 g in sodium chloride 0.9 % 100 mL IVPB  Status:  Discontinued        2 g 200 mL/hr over 30 Minutes Intravenous  Once 08/02/20 0500 08/02/20 0502  08/02/20 0515  vancomycin (VANCOCIN) IVPB 1000 mg/200 mL premix  Status:  Discontinued        1,000 mg 200 mL/hr over 60 Minutes Intravenous  Once 08/02/20 0500 08/02/20 0503   08/02/20 0230  vancomycin (VANCOREADY) IVPB 2000 mg/400 mL        2,000 mg 200 mL/hr over 120 Minutes Intravenous STAT 08/02/20 0218 08/02/20 0441   08/02/20 0215  ceFEPIme (MAXIPIME) 2 g in sodium chloride 0.9 % 100 mL IVPB        2 g 200 mL/hr over 30 Minutes Intravenous  Once 08/02/20 0208 08/02/20 0250   08/02/20 0215  metroNIDAZOLE (FLAGYL) IVPB 500 mg        500 mg 100 mL/hr over 60 Minutes Intravenous  Once 08/02/20 0208 08/02/20 0343   08/02/20 0215  vancomycin (VANCOCIN) IVPB 1000 mg/200 mL premix  Status:  Discontinued        1,000 mg 200 mL/hr over 60 Minutes Intravenous  Once 08/02/20 0208 08/02/20 0218       I have personally reviewed the following labs and images: CBC: Recent Labs  Lab 07/29/20 2255 08/02/20 0041 08/02/20 0559 08/03/20 0411 08/04/20 0335  WBC 24.8* 24.9* 25.1* 20.7* 18.8*  NEUTROABS 21.3* 21.7* 21.6*  --  16.1*  HGB 10.2* 8.2* 8.4* 7.7* 7.9*  HCT 32.7* 26.6* 27.7* 25.7* 25.4*  MCV 94.8 96.0 97.2 97.0 96.9  PLT 351 311 289 280 302   BMP &GFR Recent Labs  Lab 07/29/20 2255 08/01/20 0041 08/02/20 0559 08/03/20 0411 08/04/20 0335  NA 142 142 147* 142 145  K 4.7 3.9 4.2 4.0 3.9  CL 107 109 114* 112* 115*  CO2 24 21* 21* 21* 20*  GLUCOSE 216* 230* 223* 155* 126*  BUN 25* 46* 46* 56* 64*  CREATININE 1.54* 2.13* 2.27* 2.15* 1.90*  CALCIUM 9.2 8.6* 8.6* 8.5* 8.8*  MG  --    --   --  2.5* 2.3  PHOS  --   --   --  4.7* 4.2   Estimated Creatinine Clearance: 36 mL/min (A) (by C-G formula based on SCr of 1.9 mg/dL (H)). Liver & Pancreas: Recent Labs  Lab 07/30/20 0255 08/02/20 0559 08/03/20 0411 08/04/20 0335  AST 17 17  --  16  ALT 14 19  --  17  ALKPHOS 101 135*  --  131*  BILITOT 0.8 1.4*  --  1.0  PROT 6.2* 6.1*  --  6.0*  ALBUMIN 2.8* 2.7* 2.5* 2.4*  2.4*   No results for input(s): LIPASE, AMYLASE in the last 168 hours. No results for input(s): AMMONIA in the last 168 hours. Diabetic: No results for input(s): HGBA1C in the last 72 hours. Recent Labs  Lab 08/03/20 1629 08/03/20 2026 08/04/20 0341 08/04/20 0724 08/04/20 1216  GLUCAP 166* 134* 122* 123* 111*   Cardiac Enzymes: Recent Labs  Lab 08/04/20 0335  CKTOTAL 46   No results for input(s): PROBNP in the last 8760 hours. Coagulation Profile: Recent Labs  Lab 08/02/20 0041 08/02/20 0559  INR 1.1 1.1   Thyroid Function Tests: No results for input(s): TSH, T4TOTAL, FREET4, T3FREE, THYROIDAB in the last 72 hours. Lipid Profile: No results for input(s): CHOL, HDL, LDLCALC, TRIG, CHOLHDL, LDLDIRECT in the last 72 hours. Anemia Panel: No results for input(s): VITAMINB12, FOLATE, FERRITIN, TIBC, IRON, RETICCTPCT in the last 72 hours. Urine analysis:    Component Value Date/Time   COLORURINE AMBER (A) 08/02/2020 0540   APPEARANCEUR CLEAR 08/02/2020 0540   LABSPEC 1.020  08/02/2020 0540   PHURINE 5.0 08/02/2020 0540   GLUCOSEU 50 (A) 08/02/2020 0540   HGBUR NEGATIVE 08/02/2020 0540   BILIRUBINUR NEGATIVE 08/02/2020 0540   KETONESUR NEGATIVE 08/02/2020 0540   PROTEINUR >=300 (A) 08/02/2020 0540   UROBILINOGEN 0.2 11/29/2014 1231   NITRITE POSITIVE (A) 08/02/2020 0540   LEUKOCYTESUR NEGATIVE 08/02/2020 0540   Sepsis Labs: Invalid input(s): PROCALCITONIN, [place]  Microbiology: Recent Results (from the past 240 hour(s))  Culture, blood (routine x 2)     Status: None    Collection Time: 07/30/20  2:55 AM   Specimen: BLOOD LEFT ARM  Result Value Ref Range Status   Specimen Description BLOOD LEFT ARM  Final   Special Requests   Final    BOTTLES DRAWN AEROBIC AND ANAEROBIC Blood Culture adequate volume   Culture   Final    NO GROWTH 5 DAYS Performed at [facility], [address], [place], [place] [address]    Report Status 08/04/2020 FINAL  Final  Culture, blood (routine x 2)     Status: None   Collection Time: 07/30/20  2:58 AM   Specimen: BLOOD LEFT HAND  Result Value Ref Range Status   Specimen Description BLOOD LEFT HAND  Final   Special Requests   Final    BOTTLES DRAWN AEROBIC AND ANAEROBIC Blood Culture adequate volume   Culture   Final    NO GROWTH 5 DAYS Performed at [facility], [place] [address], [place], [place] [address]    Report Status 08/04/2020 FINAL  Final  Resp Panel by RT-PCR (Flu A&B, Covid) Nasopharyngeal Swab     Status: None   Collection Time: 07/30/20  3:05 AM   Specimen: Nasopharyngeal Swab; Nasopharyngeal(NP) swabs in vial transport medium  Result Value Ref Range Status   SARS Coronavirus 2 by RT PCR NEGATIVE NEGATIVE Final    Comment: (NOTE) SARS-CoV-2 target nucleic acids are NOT DETECTED.  The SARS-CoV-2 RNA is generally detectable in upper respiratory specimens during the acute phase of infection. The lowest concentration of SARS-CoV-2 viral copies this assay can detect is 138 copies/mL. A negative result does not preclude SARS-Cov-2 infection and should not be used as the sole basis for treatment or other patient management decisions. A negative result may occur with  improper specimen collection/handling, submission of specimen other than nasopharyngeal swab, presence of viral mutation(s) within the areas targeted by this assay, and inadequate number of viral copies(<138 copies/mL). A negative result must be combined with clinical observations, patient history, and epidemiological information. The  expected result is Negative.  Fact Sheet for Patients:  EntrepreneurPulse.com.au  Fact Sheet for Healthcare Providers:  IncredibleEmployment.be  This test is no t yet approved or cleared by the [place] FDA and  has been authorized for detection and/or diagnosis of SARS-CoV-2 by FDA under an Emergency Use Authorization (EUA). This EUA will remain  in effect (meaning this test can be used) for the duration of the COVID-19 declaration under Section 564(b)(1) of the Act, 21 U.S.C.section 360bbb-3(b)(1), unless the authorization is terminated  or revoked sooner.       Influenza A by PCR NEGATIVE NEGATIVE Final   Influenza B by PCR NEGATIVE NEGATIVE Final    Comment: (NOTE) The Xpert Xpress SARS-CoV-2/FLU/RSV plus assay is intended as an aid in the diagnosis of influenza from Nasopharyngeal swab specimens and should not be used as a sole basis for treatment. Nasal washings and aspirates are unacceptable for Xpert Xpress SARS-CoV-2/FLU/RSV testing.  Fact Sheet  for Patients: EntrepreneurPulse.com.au  Fact Sheet for Healthcare Providers: IncredibleEmployment.be  This test is not yet approved or cleared by the [place] FDA and has been authorized for detection and/or diagnosis of SARS-CoV-2 by FDA under an Emergency Use Authorization (EUA). This EUA will remain in effect (meaning this test can be used) for the duration of the COVID-19 declaration under Section 564(b)(1) of the Act, 21 U.S.C. section 360bbb-3(b)(1), unless the authorization is terminated or revoked.  Performed at [facility], [place] [address] [place], [place] [address]   Urine culture     Status: Abnormal   Collection Time: 07/30/20 11:42 AM   Specimen: Urine, Clean Catch  Result Value Ref Range Status   Specimen Description URINE, CLEAN CATCH  Final   Special Requests NONE  Final   Culture (A)  Final    <10,000 COLONIES/mL  INSIGNIFICANT GROWTH Performed at [facility], [place] [address], [place], [place] [address]    Report Status 07/31/2020 FINAL  Final  Blood Culture (routine x 2)     Status: None (Preliminary result)   Collection Time: 08/02/20  1:00 AM   Specimen: BLOOD  Result Value Ref Range Status   Specimen Description   Final    BLOOD BLOOD LEFT FOREARM Performed at [facility] [address], [place], [place] [address]    Special Requests   Final    BOTTLES DRAWN AEROBIC AND ANAEROBIC Blood Culture results may not be optimal due to an inadequate volume of blood received in culture bottles Performed at [place] [address], [place], [place] [address]    Culture   Final    NO GROWTH 2 DAYS Performed at [place] [address] [place], [place] [address]    Report Status PENDING  Incomplete  Blood Culture (routine x 2)     Status: None (Preliminary result)   Collection Time: 08/02/20  1:00 AM   Specimen: BLOOD  Result Value Ref Range Status   Specimen Description   Final    BLOOD BLOOD RIGHT FOREARM Performed at [facility] [place] [address], [place], [place] [address]    Special Requests   Final    BOTTLES DRAWN AEROBIC AND ANAEROBIC Blood Culture adequate volume Performed at [facility] [address], [place], [place] [address]    Culture   Final    NO GROWTH 2 DAYS Performed at [facility] [address] [place],  [address]    Report Status PENDING  Incomplete  Resp Panel by RT-PCR (Flu A&B, Covid) Nasopharyngeal Swab     Status: None   Collection Time: 08/02/20  2:32 AM   Specimen: Nasopharyngeal Swab; Nasopharyngeal(NP) swabs in vial transport medium  Result Value Ref Range Status   SARS Coronavirus 2 by RT PCR NEGATIVE NEGATIVE Final    Comment: (NOTE) SARS-CoV-2 target nucleic acids are NOT DETECTED.  The SARS-CoV-2 RNA is generally detectable in upper  respiratory specimens during the acute phase of infection. The lowest concentration of SARS-CoV-2 viral copies this assay can detect is 138 copies/mL. A negative result does not preclude SARS-Cov-2 infection and should not be used as the sole basis for treatment or other patient management decisions. A negative result may occur with  improper specimen collection/handling, submission of specimen other than nasopharyngeal swab, presence of viral mutation(s) within the areas targeted by this assay, and inadequate number of viral copies(<138 copies/mL). A negative result must  be combined with clinical observations, patient history, and epidemiological information. The expected result is Negative.  Fact Sheet for Patients:  EntrepreneurPulse.com.au  Fact Sheet for Healthcare Providers:  IncredibleEmployment.be  This test is no t yet approved or cleared by the [place] FDA and  has been authorized for detection and/or diagnosis of SARS-CoV-2 by FDA under an Emergency Use Authorization (EUA). This EUA will remain  in effect (meaning this test can be used) for the duration of the COVID-19 declaration under Section 564(b)(1) of the Act, 21 U.S.C.section 360bbb-3(b)(1), unless the authorization is terminated  or revoked sooner.       Influenza A by PCR NEGATIVE NEGATIVE Final   Influenza B by PCR NEGATIVE NEGATIVE Final    Comment: (NOTE) The Xpert Xpress SARS-CoV-2/FLU/RSV plus assay is intended as an aid in the diagnosis of influenza from Nasopharyngeal swab specimens and should not be used as a sole basis for treatment. Nasal washings and aspirates are unacceptable for Xpert Xpress SARS-CoV-2/FLU/RSV testing.  Fact Sheet for Patients: EntrepreneurPulse.com.au  Fact Sheet for Healthcare Providers: IncredibleEmployment.be  This test is not yet approved or cleared by the [place] FDA and has been  authorized for detection and/or diagnosis of SARS-CoV-2 by FDA under an Emergency Use Authorization (EUA). This EUA will remain in effect (meaning this test can be used) for the duration of the COVID-19 declaration under Section 564(b)(1) of the Act, 21 U.S.C. section 360bbb-3(b)(1), unless the authorization is terminated or revoked.  Performed at [facility], [place] [address], [place], [place] [address]   Urine culture     Status: None   Collection Time: 08/02/20  5:40 AM   Specimen: In/Out Cath Urine  Result Value Ref Range Status   Specimen Description   Final    IN/OUT CATH URINE Performed at [facility] [address] [place], [place] [address]    Special Requests   Final    NONE Performed at [facility], [place] [address], [place], [place] [address]    Culture   Final    NO GROWTH Performed at [facility], [place] [address], [place], [place] [address]    Report Status 08/03/2020 FINAL  Final  MRSA PCR Screening     Status: None   Collection Time: 08/02/20  9:05 AM   Specimen: Nasopharyngeal  Result Value Ref Range Status   MRSA by PCR NEGATIVE NEGATIVE Final    Comment:        The GeneXpert MRSA Assay (FDA approved for NASAL specimens only), is one component of a comprehensive MRSA colonization surveillance program. It is not intended to diagnose MRSA infection nor to guide or monitor treatment for MRSA infections. Performed at [facility], [place] [address], [place],  [address]     Radiology Studies: CT HEAD WO CONTRAST  Result Date: 08/04/2020 CLINICAL DATA:  65 year old female with unexplained altered mental status. EXAM: CT HEAD WITHOUT CONTRAST TECHNIQUE: Contiguous axial images were obtained from the base of the skull through the vertex without intravenous contrast. COMPARISON:  None. FINDINGS: Brain: Cerebral volume is within normal limits for age. No midline shift,  ventriculomegaly, mass effect, evidence of mass lesion, intracranial hemorrhage or evidence of cortically based acute infarction. Small hypodensity at the lateral left thalamus near the posterior limb left internal capsule on series 10, image 32. Elsewhere gray-white matter differentiation is symmetric and within normal limits for age. No cortical encephalomalacia identified. Vascular: Calcified atherosclerosis at the skull  base. No suspicious intracranial vascular hyperdensity. Skull: No acute osseous abnormality identified. Sinuses/Orbits: Visualized paranasal sinuses and mastoids are clear. Other: No acute orbit or scalp soft tissue finding. IMPRESSION: 1. Suspected small age indeterminate lacunar infarct in the lateral left thalamus, less likely a perivascular space (normal variant). Query right sided symptoms. 2. Otherwise normal for age non contrast CT appearance of the brain. Electronically Signed   By: Genevie Ann M.D.   On: 08/04/2020 06:04   US RENAL  Result Date: 08/03/2020 CLINICAL DATA:  Acute kidney injury. EXAM: RENAL / URINARY TRACT ULTRASOUND COMPLETE COMPARISON:  November 29, 2014. FINDINGS: Right Kidney: Renal measurements: 12.1 x 6.0 x 6.0 cm = volume: 225 mL. 2.7 cm simple cyst is noted. Echogenicity within normal limits. No mass or hydronephrosis visualized. Left Kidney: Renal measurements: 11.5 x 6.0 x 5.3 cm = volume: 191 mL. Echogenicity within normal limits. No mass or hydronephrosis visualized. Bladder: Appears normal for degree of bladder distention. Other: None. IMPRESSION: No significant renal abnormality is noted. Electronically Signed   By: Marijo Conception M.D.   On: 08/03/2020 16:24   MR FOOT LEFT WO CONTRAST  Result Date: 08/04/2020 CLINICAL DATA:  Left foot swelling. EXAM: MRI OF THE LEFT FOOT WITHOUT CONTRAST TECHNIQUE: Multiplanar, multisequence MR imaging of the left foot was performed. No intravenous contrast was administered. COMPARISON:  None. FINDINGS: Large field of view  limits evaluation. Bones/Joint/Cartilage No fracture or dislocation. Normal alignment. No joint effusion. No periosteal reaction or bone destruction. Minimal marrow edema at the base of the third proximal phalanx. Ligaments Collateral ligaments are intact.  Lisfranc ligament is intact. Muscles and Tendons Flexor, peroneal and extensor compartment tendons are intact. Generalized T2 hyperintense signal throughout the plantar musculature which may be neurogenic versus secondary to myositis. Soft tissue Severe soft tissue edema throughout the left foot. Low signal foci in the dorsal aspect of forefoot consistent with air which tracks proximally to the level of the TMT joints. Small amount air seen along the plantar aspect of the foot along the second MTP joint. No drainable fluid collection to suggest an abscess. IMPRESSION: 1. Severe soft edema throughout the foot with air in the dorsal aspect of the forefoot tracking proximally to the level of the TMT joints most concerning for necrotizing infection. 2. Minimal marrow edema at the base of the third proximal phalanx without cortical destruction may reflect reactive marrow edema versus early osteomyelitis. Electronically Signed   By: Kathreen Devoid   On: 08/04/2020 11:55     Ami Mally T. [place]  If 7PM-7AM, please contact night-coverage [email or web address] 08/04/2020, 12:23 PM

## 2020-08-04 NOTE — Transfer of Care (Signed)
Immediate Anesthesia Transfer of Care Note  Patient: [name]  Procedure(s) Performed: IRRIGATION AND DEBRIDEMENT LEFT FOOT WOUND (Left Foot)  Patient Location: PACU  Anesthesia Type:MAC  Level of Consciousness: the same as preop status.   Airway & Oxygen Therapy: Patient Spontanous Breathing and Patient connected to face mask oxygen  Post-op Assessment: Report given to RN and Post -op Vital signs reviewed and stable  Post vital signs: Reviewed and stable  Last Vitals:  Vitals Value Taken Time  BP    Temp    Pulse    Resp    SpO2      Last Pain:  Vitals:   08/04/20 1213  TempSrc: Oral  PainSc:       Patients Stated Pain Goal: 3 (39/53/20 2334)  Complications: No complications documented.

## 2020-08-04 NOTE — Progress Notes (Signed)
Rapid response called for change in LOC. Provider notified. Pt drowsy/responds to voice,. Was aox4 and currently aox2. MD ordered abg and lactic. Will continue to monitor.

## 2020-08-04 NOTE — Interval H&P Note (Signed)
History and Physical Interval Note:  08/04/2020 4:21 PM  Shirley Chen  has presented today for surgery, with the diagnosis of LEFT FOOT ABSCESS.  The various methods of treatment have been discussed with the patient and family. After consideration of risks, benefits and other options for treatment, the patient has consented to  Procedure(s): IRRIGATION AND DEBRIDEMENT LEFT FOOT WOUND (Left) and incision and drainage as a surgical intervention.  The patient's history has been reviewed, patient examined, no change in status, stable for surgery.  I have reviewed the patient's chart and labs.  Questions were answered to the patient's satisfaction.    This could be part of a staged procedure based on amount of findings and Intra-Op finding patient may need to return back for final closure.    Informed surgical risk consent was reviewed and read aloud to the patient.  I reviewed the films.  I have discussed my findings with the patient in great detail.  I have discussed all risks including but not limited to infection, stiffness, scarring, limp, disability, deformity, damage to blood vessels and nerves, numbness, poor healing, need for braces, arthritis, chronic pain, amputation, death.  All benefits and realistic expectations discussed in great detail.  I have made no promises as to the outcome.  I have provided realistic expectations.  I have offered the patient a 2nd opinion, which they have declined and assured me they preferred to proceed despite the risks    Candelaria Stagers

## 2020-08-04 NOTE — Anesthesia Postprocedure Evaluation (Signed)
Anesthesia Post Note  Patient: Shirley Chen  Procedure(s) Performed: IRRIGATION AND DEBRIDEMENT LEFT FOOT WOUND (Left Foot)     Patient location during evaluation: PACU Anesthesia Type: MAC Level of consciousness: awake and alert Pain management: pain level controlled Vital Signs Assessment: post-procedure vital signs reviewed and stable Respiratory status: spontaneous breathing, nonlabored ventilation, respiratory function stable and patient connected to nasal cannula oxygen Cardiovascular status: stable and blood pressure returned to baseline Postop Assessment: no apparent nausea or vomiting Anesthetic complications: no   No complications documented.  Last Vitals:  Vitals:   08/04/20 1800 08/04/20 1815  BP: (!) 152/61 (!) 141/69  Pulse: 73 74  Resp: 14 16  Temp:  37.3 C  SpO2: 97% 91%    Last Pain:  Vitals:   08/04/20 1213  TempSrc: Oral  PainSc:                  [name]

## 2020-08-04 NOTE — Progress Notes (Signed)
Patient consulted by dr Alanda Slim for diabetic foot infection/sepsis Foot infection severe concerning for nec/fasc Taken to or urgently as of now   On linezolid/cefepime/flagyl bcx negative for mrsa/staph  -f/u surgical culture -continue current antimicrobial -will formally see tomorrow

## 2020-08-04 NOTE — Consult Note (Signed)
Name: Shirley Chen MRN: [id number] DOB: 1956-01-10    ADMISSION DATE:  08/01/2020 CONSULTATION DATE:  08/04/2020  REFERRING MD :  Shirley Chen  CHIEF COMPLAINT: Left foot pain and swelling, respiratory distress, hypoxia  BRIEF PATIENT DESCRIPTION: 65 year old smoker, diabetic admitted with shortness of breath, found to have necrotizing fasciitis of left foot.  Increasing hypoxia requiring 6 L, hence PCCM consulted  SIGNIFICANT EVENTS  4/30 rapid response called for mental status changes  STUDIES:  CT chest without contrast 4/28 no infiltrate VQ scan 4/28 no perfusion defects B Lower extremity duplex negative Echo 4/30 normal LVEF grade 2 diastolic dysfunction   HISTORY OF PRESENT ILLNESS: She was admitted 4/28 with initial complaints of shortness of breath, found to be hypoxic requiring 3 L, Rapidly progressing to 6 L nasal cannula. She was mildly hypotensive and received fluids.  Initial labs significant for WBC of 24K and AKI with creatinine of 2.1.  VQ scan did not show any perfusion defects .  BNP was 383 and troponin was mildly elevated. She was treated with cefepime and Flagyl empirically for severe sepsis, then noted to have painful left foot .  Rapid response was called due to mental status changes overnight on 4/30, head CT negative.  MRI showed air in the dorsal aspect of the forefoot tracking proximally, there were small amount of air along the plantar aspect of the foot.  Concern was raised for necrotizing infection and early osteomyelitis of the third proximal phalanx.  Podiatry was consulted and plan is for OR 5/1.  Linezolid has been added to cefepime and Flagyl. PCCM is consulted for hypoxia and perioperative assistance  She currently complains of pain in her left foot, "are you going to take my foot off?"  Does not offer any complaints of shortness of breath or chest pain  PAST MEDICAL HISTORY :   has a past medical history of Arthritis, Cellulitis of right foot  (10/2013), Constipation, Diabetes mellitus without complication ([place]), Headache, Hyperlipidemia, Hypertension, Tobacco abuse, and UTI (lower urinary tract infection).  has a past surgical history that includes Tibia fracture surgery (Right, 2001 X 5); Ankle fracture surgery (2001 X 5); Fracture surgery; Cardiac catheterization (N/A, 10/23/2014); Femoral-tibial Bypass Graft (Right, 10/27/2014); Intraoperative arteriogram (Right, 10/27/2014); and Amputation (Right, 11/28/2014). Prior to Admission medications   Medication Sig Start Date End Date Taking? Authorizing Provider  amLODipine (NORVASC) 10 MG tablet TAKE 1 TABLET BY MOUTH  DAILY Patient taking differently: Take 10 mg by mouth daily. 03/12/20  Yes Shirley Salvage, FNP  atorvastatin (LIPITOR) 40 MG tablet TAKE 1 TABLET BY MOUTH  DAILY Patient taking differently: Take 40 mg by mouth daily. 03/12/20  Yes Shirley Salvage, FNP  carvedilol (COREG) 25 MG tablet TAKE 1 TABLET BY MOUTH  TWICE DAILY WITH A MEAL Patient taking differently: Take 25 mg by mouth 2 (two) times daily with a meal. 03/12/20  Yes Shirley Salvage, FNP  ezetimibe (ZETIA) 10 MG tablet TAKE 1 TABLET BY MOUTH  DAILY Patient taking differently: Take 10 mg by mouth daily. 03/12/20  Yes Shirley Salvage, FNP  gabapentin (NEURONTIN) 100 MG capsule TAKE 3 CAPSULES BY MOUTH  TWICE DAILY 03/12/20  Yes Shirley Salvage, FNP  hydrALAZINE (APRESOLINE) 50 MG tablet TAKE 1 AND 1/2 TABLETS BY  MOUTH 3 TIMES DAILY Patient taking differently: Take 75 mg by mouth 3 (three) times daily. 10/03/19  Yes Shirley Salvage, FNP  Insulin Lispro Prot & Lispro (HUMALOG MIX 75/25 KWIKPEN) (75-25) 100 UNIT/ML  Kwikpen INJECT SUBCUTANEOUSLY 30  UNITS IN THE MORNING AND 30 UNITS IN THE EVENING Patient taking differently: Inject 30 Units into the skin See admin instructions. IN THE MORNING AND  IN THE EVENING 01/20/20  Yes Shirley Salvage, FNP  lisinopril (ZESTRIL) 10 MG tablet  TAKE 1 TABLET BY MOUTH  DAILY Patient taking differently: Take 10 mg by mouth daily. 03/12/20  Yes Shirley Salvage, FNP  metFORMIN (GLUCOPHAGE) 500 MG tablet Take 1 tablet (500 mg total) by mouth 2 (two) times daily with a meal. 06/22/20  Yes Shirley Salvage, FNP  ACCU-CHEK GUIDE test strip USE 1 STRIP TO CHECK BLOOD  GLUCOSE 3 TIMES DAILY 03/12/20   Shirley Salvage, FNP  blood glucose meter kit and supplies KIT Dispense based on patient and insurance preference. Use up to four times daily as directed. (FOR ICD-9 250.00, 250.01). 08/03/19   Shirley Salvage, FNP  blood glucose meter kit and supplies KIT Dispense based on patient and insurance preference. Use up to four times daily as directed. (FOR ICD-9 250.00, 250.01). 08/05/19   Shirley Salvage, FNP  glucose monitoring kit (FREESTYLE) monitoring kit 1 each by Does not apply route 4 (four) times daily - after meals and at bedtime. 1 month Diabetic Testing Supplies for QAC-QHS accuchecks. Any brand OK. Diagnosis E11.65 02/07/19   Shirley Salvage, FNP  RELION PEN NEEDLE 31G/8MM 31G X 8 MM MISC Use to administer insulin four times a day Dx E11.9 04/18/19   Shirley Salvage, FNP   Allergies  Allergen Reactions  . Losartan Nausea And Vomiting  . Morphine And Related     Pt states " I just got really sick"    FAMILY HISTORY:  family history includes Alcohol abuse in her father; Diabetes in her mother; Heart attack in her father; Heart disease in her father; Hyperlipidemia in her father; Hypertension in her father; Lung cancer in her brother. SOCIAL HISTORY:  reports that she has been smoking cigarettes. She has a 12.25 pack-year smoking history. She has never used smokeless tobacco. She reports that she does not drink alcohol and does not use drugs.  REVIEW OF SYSTEMS:   Complains of left foot pain and swelling  Constitutional: Negative for fever, chills, weight loss, malaise/fatigue and diaphoresis.   HENT: Negative for hearing loss, ear pain, nosebleeds, congestion, sore throat, neck pain, tinnitus and ear discharge.   Eyes: Negative for blurred vision, double vision, photophobia, pain, discharge and redness.  Respiratory: Negative for cough, hemoptysis, sputum production, , wheezing and stridor.   Cardiovascular: Negative for chest pain, palpitations, orthopnea, claudication and PND.  Gastrointestinal: Negative for heartburn, nausea, vomiting, abdominal pain, diarrhea, constipation, blood in stool and melena.  Genitourinary: Negative for dysuria, urgency, frequency, hematuria and flank pain.  Musculoskeletal: Negative for myalgias, back pain, joint pain and falls.  Skin: Negative for itching and rash.  Neurological: Negative for dizziness, tingling, tremors, sensory change, speech change, focal weakness, seizures, loss of consciousness, weakness and headaches.  Endo/Heme/Allergies: Negative for environmental allergies and polydipsia. Does not bruise/bleed easily.  SUBJECTIVE:   VITAL SIGNS: Temp:  [98.4 F (36.9 C)-100.4 F (38 C)] 98.4 F (36.9 C) (04/30 1213) Pulse Rate:  [63-74] 68 (04/30 1421) Resp:  [12-20] 12 (04/30 1213) BP: (110-137)/(56-108) 130/56 (04/30 1213) SpO2:  [90 %-98 %] 91 % (04/30 1421)  PHYSICAL EXAMINATION: General: Chronically ill-appearing, mild distress due to pain Neuro: Awake, follows commands, appears distraught due to pain and anxiety around possible amputation HEENT:  No JVD, mild pallor, icterus Cardiovascular: S1-S2 tacky, no murmur Lungs: Decreased breath sounds bilateral, no rhonchi Abdomen: Soft, nontender abdomen Musculoskeletal: Left foot swollen, erythema tender, 1+ edema up to shins, no skip lesions in calf or thigh Skin: As above, no generalized rash  Recent Labs  Lab 08/02/20 0559 08/03/20 0411 08/04/20 0335  NA 147* 142 145  K 4.2 4.0 3.9  CL 114* 112* 115*  CO2 21* 21* 20*  BUN 46* 56* 64*  CREATININE 2.27* 2.15* 1.90*   GLUCOSE 223* 155* 126*   Recent Labs  Lab 08/02/20 0559 08/03/20 0411 08/04/20 0335  HGB 8.4* 7.7* 7.9*  HCT 27.7* 25.7* 25.4*  WBC 25.1* 20.7* 18.8*  PLT 289 280 302   DG Chest 2 View  Result Date: 08/03/2020 CLINICAL DATA:  Respiratory failure. EXAM: CHEST - 2 VIEW COMPARISON:  CT chest and chest x-ray from yesterday. FINDINGS: The heart size and mediastinal contours are within normal limits. Normal pulmonary vascularity. Mild bibasilar atelectasis. No focal consolidation, pleural effusion, or pneumothorax. No acute osseous abnormality. IMPRESSION: 1. Mild bibasilar atelectasis. Electronically Signed   By: Titus Dubin M.D.   On: 08/03/2020 11:32   CT HEAD WO CONTRAST  Result Date: 08/04/2020 CLINICAL DATA:  65 year old female with unexplained altered mental status. EXAM: CT HEAD WITHOUT CONTRAST TECHNIQUE: Contiguous axial images were obtained from the base of the skull through the vertex without intravenous contrast. COMPARISON:  None. FINDINGS: Brain: Cerebral volume is within normal limits for age. No midline shift, ventriculomegaly, mass effect, evidence of mass lesion, intracranial hemorrhage or evidence of cortically based acute infarction. Small hypodensity at the lateral left thalamus near the posterior limb left internal capsule on series 10, image 32. Elsewhere gray-white matter differentiation is symmetric and within normal limits for age. No cortical encephalomalacia identified. Vascular: Calcified atherosclerosis at the skull base. No suspicious intracranial vascular hyperdensity. Skull: No acute osseous abnormality identified. Sinuses/Orbits: Visualized paranasal sinuses and mastoids are clear. Other: No acute orbit or scalp soft tissue finding. IMPRESSION: 1. Suspected small age indeterminate lacunar infarct in the lateral left thalamus, less likely a perivascular space (normal variant). Query right sided symptoms. 2. Otherwise normal for age non contrast CT appearance of the  brain. Electronically Signed   By: Genevie Ann M.D.   On: 08/04/2020 06:04   US RENAL  Result Date: 08/03/2020 CLINICAL DATA:  Acute kidney injury. EXAM: RENAL / URINARY TRACT ULTRASOUND COMPLETE COMPARISON:  November 29, 2014. FINDINGS: Right Kidney: Renal measurements: 12.1 x 6.0 x 6.0 cm = volume: 225 mL. 2.7 cm simple cyst is noted. Echogenicity within normal limits. No mass or hydronephrosis visualized. Left Kidney: Renal measurements: 11.5 x 6.0 x 5.3 cm = volume: 191 mL. Echogenicity within normal limits. No mass or hydronephrosis visualized. Bladder: Appears normal for degree of bladder distention. Other: None. IMPRESSION: No significant renal abnormality is noted. Electronically Signed   By: Marijo Conception M.D.   On: 08/03/2020 16:24   MR FOOT LEFT WO CONTRAST  Result Date: 08/04/2020 CLINICAL DATA:  Left foot swelling. EXAM: MRI OF THE LEFT FOOT WITHOUT CONTRAST TECHNIQUE: Multiplanar, multisequence MR imaging of the left foot was performed. No intravenous contrast was administered. COMPARISON:  None. FINDINGS: Large field of view limits evaluation. Bones/Joint/Cartilage No fracture or dislocation. Normal alignment. No joint effusion. No periosteal reaction or bone destruction. Minimal marrow edema at the base of the third proximal phalanx. Ligaments Collateral ligaments are intact.  Lisfranc ligament is intact. Muscles and Tendons Flexor, peroneal  and extensor compartment tendons are intact. Generalized T2 hyperintense signal throughout the plantar musculature which may be neurogenic versus secondary to myositis. Soft tissue Severe soft tissue edema throughout the left foot. Low signal foci in the dorsal aspect of forefoot consistent with air which tracks proximally to the level of the TMT joints. Small amount air seen along the plantar aspect of the foot along the second MTP joint. No drainable fluid collection to suggest an abscess. IMPRESSION: 1. Severe soft edema throughout the foot with air in the  dorsal aspect of the forefoot tracking proximally to the level of the TMT joints most concerning for necrotizing infection. 2. Minimal marrow edema at the base of the third proximal phalanx without cortical destruction may reflect reactive marrow edema versus early osteomyelitis. Electronically Signed   By: Kathreen Devoid   On: 08/04/2020 11:55   ECHOCARDIOGRAM COMPLETE  Result Date: 08/04/2020    ECHOCARDIOGRAM REPORT   Patient Name:   [facility] Date of Exam: 08/04/2020 Medical Rec #:  [id number]      Height:       63.0 in Accession #:    [id number]     Weight:       241.2 lb Date of Birth:  Jun 11, 1955      BSA:          2.094 m Patient Age:    75 years       BP:           130/56 mmHg Patient Gender: F              HR:           63 bpm. Exam Location:  Inpatient Procedure: 2D Echo, Cardiac Doppler and Color Doppler Indications:    Dyspnea 06.00  History:        Patient has no prior history of Echocardiogram examinations.                 Risk Factors:Hypertension, Diabetes, Dyslipidemia and Current                 Smoker.  Sonographer:    Tiffany Dance Referring Phys: [id number] Charlesetta Ivory GONFA IMPRESSIONS  1. Left ventricular ejection fraction, by estimation, is 60 to 65%. The left ventricle has normal function. The left ventricle has no regional wall motion abnormalities. Left ventricular diastolic parameters are consistent with Grade II diastolic dysfunction (pseudonormalization).  2. Right ventricular systolic function is normal. The right ventricular size is normal.  3. Left atrial size was mildly dilated.  4. The mitral valve is normal in structure. Trivial mitral valve regurgitation. No evidence of mitral stenosis.  5. The aortic valve is normal in structure. Aortic valve regurgitation is not visualized. No aortic stenosis is present.  6. The inferior vena cava is dilated in size with >50% respiratory variability, suggesting right atrial pressure of 8 mmHg. FINDINGS  Left Ventricle: Left ventricular ejection  fraction, by estimation, is 60 to 65%. The left ventricle has normal function. The left ventricle has no regional wall motion abnormalities. The left ventricular internal cavity size was normal in size. There is  no left ventricular hypertrophy. Left ventricular diastolic parameters are consistent with Grade II diastolic dysfunction (pseudonormalization). Right Ventricle: The right ventricular size is normal. No increase in right ventricular wall thickness. Right ventricular systolic function is normal. Left Atrium: Left atrial size was mildly dilated. Right Atrium: Right atrial size was normal in size. Pericardium: There is no evidence of pericardial effusion. Mitral  Valve: The mitral valve is normal in structure. Trivial mitral valve regurgitation. No evidence of mitral valve stenosis. Tricuspid Valve: The tricuspid valve is normal in structure. Tricuspid valve regurgitation is not demonstrated. No evidence of tricuspid stenosis. Aortic Valve: The aortic valve is normal in structure. Aortic valve regurgitation is not visualized. No aortic stenosis is present. Pulmonic Valve: The pulmonic valve was normal in structure. Pulmonic valve regurgitation is not visualized. No evidence of pulmonic stenosis. Aorta: The aortic root is normal in size and structure. Venous: The inferior vena cava is dilated in size with greater than 50% respiratory variability, suggesting right atrial pressure of 8 mmHg. IAS/Shunts: No atrial level shunt detected by color flow Doppler.  LEFT VENTRICLE PLAX 2D LVIDd:         5.30 cm  Diastology LVIDs:         3.50 cm  LV e' medial:    7.07 cm/s LV PW:         1.10 cm  LV E/e' medial:  18.4 LV IVS:        0.80 cm  LV e' lateral:   10.10 cm/s LVOT diam:     1.90 cm  LV E/e' lateral: 12.9 LV SV:         85 LV SV Index:   41 LVOT Area:     2.84 cm  RIGHT VENTRICLE             IVC RV Basal diam:  2.70 cm     IVC diam: 2.80 cm RV S prime:     13.60 cm/s TAPSE (M-mode): 2.5 cm LEFT ATRIUM               Index       RIGHT ATRIUM           Index LA diam:        3.90 cm  1.86 cm/m  RA Area:     16.10 cm LA Vol (A2C):   111.0 ml 53.01 ml/m RA Volume:   42.10 ml  20.11 ml/m LA Vol (A4C):   41.9 ml  20.01 ml/m LA Biplane Vol: 73.7 ml  35.20 ml/m  AORTIC VALVE LVOT Vmax:   140.00 cm/s LVOT Vmean:  82.100 cm/s LVOT VTI:    0.300 m  AORTA Ao Root diam: 2.90 cm Ao Asc diam:  3.10 cm MITRAL VALVE MV Area (PHT): 3.21 cm     SHUNTS MV Decel Time: 236 msec     Systemic VTI:  0.30 m MV E velocity: 130.00 cm/s  Systemic Diam: 1.90 cm MV A velocity: 86.60 cm/s MV E/A ratio:  1.50 Candee Furbish MD Electronically signed by Candee Furbish MD Signature Date/Time: 08/04/2020/1:50:45 PM    Final     ASSESSMENT / PLAN:  Acute hypoxic respiratory failure -no bronchospasm at present, continue duo nebs, continue oxygen  The bigger concern here is for severe sepsis due to acute necrotizing fasciitis of left foot -  If so, she needs surgical debridement ASAP, hospitalist is having discussions with podiatry and defer to them. Antibiotics have been broadened to cefepime and linezolid  AKI -creatinine has decreased to 1.9, baseline 1.2  PCCM will follow   Kara Mead MD. FCCP. [facility] Pager : [phone]  If no response to pager , please call [phone] until 7 pm After 7:00 pm call Elink  [phone]    08/04/2020, 2:31 PM

## 2020-08-04 NOTE — Progress Notes (Addendum)
Pharmacy Antibiotic Note  Shirley Chen is a 65 y.o. female admitted on 08/01/2020 with sepsis.  Pharmacy has been consulted for zyvox dosing for necrotizing fasciitis/possible osteomyelitis.   Plan: zyvox 600 mg IV q12 Continue on azithromycin 500 mg po qday per MD Continue on Flagyl 500 mg IV q8h per MD Pharmacy to sign off ID to f/u in AM  Height: 5\' 3"  (160 cm) Weight: 109.4 kg (241 lb 2.9 oz) IBW/kg (Calculated) : 52.4  Temp (24hrs), Avg:99.4 F (37.4 C), Min:98.4 F (36.9 C), Max:100.4 F (38 C)  Recent Labs  Lab 07/29/20 2255 07/30/20 0255 08/01/20 0041 08/02/20 0041 08/02/20 0230 08/02/20 0559 08/03/20 0411 08/04/20 0335 08/04/20 0338 08/04/20 0620  WBC 24.8*  --   --  24.9*  --  25.1* 20.7* 18.8*  --   --   CREATININE 1.54*  --  2.13*  --   --  2.27* 2.15* 1.90*  --   --   LATICACIDVEN  --  2.1* 1.3  --  0.9  --   --   --  0.7 0.6    Estimated Creatinine Clearance: 36 mL/min (A) (by C-G formula based on SCr of 1.9 mg/dL (H)).    Allergies  Allergen Reactions  . Losartan Nausea And Vomiting  . Morphine And Related     Pt states " I just got really sick"   Antimicrobials this admission:  4/28 Cefepime>>   4/28 Vanc x 1 4/28 Flagyl >> 4/29 azith >> (5/3) 4/30 zyvox>> Dose adjustments this admission:   Microbiology results:  4/28 BCx: ngtd 4/28 UCx: ngF 4/28 MRSA neg  Thank you for allowing pharmacy to be a part of this patient's care.  5/28, Pharm.D 08/04/2020 1:12 PM

## 2020-08-04 NOTE — Anesthesia Preprocedure Evaluation (Addendum)
Anesthesia Evaluation  Patient identified by MRN, date of birth, ID band Patient confused  General Assessment Comment:Patient unable to respond to questions. Mildly tachypneic Febrile  Reviewed: Patient's Chart, lab work & pertinent test results, Unable to perform ROS - Chart review onlyPreop documentation limited or incomplete due to emergent nature of procedure.  Airway Mallampati: II  TM Distance: >3 FB Neck ROM: Full    Dental no notable dental hx. (+) Upper Dentures, Lower Dentures   Pulmonary Current Smoker,    breath sounds clear to auscultation + decreased breath sounds      Cardiovascular hypertension, + Peripheral Vascular Disease  Normal cardiovascular exam Rhythm:Regular Rate:Normal     Neuro/Psych negative neurological ROS  negative psych ROS   GI/Hepatic negative GI ROS, Neg liver ROS,   Endo/Other  diabetes, Insulin DependentMorbid obesity  Renal/GU Renal InsufficiencyRenal disease  negative genitourinary   Musculoskeletal negative musculoskeletal ROS (+)   Abdominal   Peds negative pediatric ROS (+)  Hematology  (+) anemia ,   Anesthesia Other Findings   Reproductive/Obstetrics negative OB ROS                            Anesthesia Physical Anesthesia Plan  ASA: IV  Anesthesia Plan: General   Post-op Pain Management:    Induction: Intravenous  PONV Risk Score and Plan: 2 and Ondansetron and Treatment may vary due to age or medical condition  Airway Management Planned: LMA  Additional Equipment:   Intra-op Plan:   Post-operative Plan: Extubation in OR  Informed Consent: I have reviewed the patients History and Physical, chart, labs and discussed the procedure including the risks, benefits and alternatives for the proposed anesthesia with the patient or authorized representative who has indicated his/her understanding and acceptance.     Dental advisory  given  Plan Discussed with: CRNA and Surgeon  Anesthesia Plan Comments:         Anesthesia Quick Evaluation

## 2020-08-04 NOTE — Evaluation (Signed)
Occupational Therapy Evaluation Patient Details Name: Shirley Chen MRN: [id number] DOB: [birth year] Today's Date: 08/04/2020    History of Present Illness Pt is 65 yo female who presented to ED on 08/01/20 with shortness of breath and admitted with acute resp failure with hypoxia - cause not clear.  Ddimer was elevated but dopplers and VQ scan were negative for clots.  Pt with PMH of DM-2, HTN, CKD-3, PVD, HLD and tobacco abuse.   Clinical Impression   Patient is currently requiring Total assistance with all ADLs including toileting, UE and LE dressing, bathing, and grooming. Pt is currently NPO. Current level of function is significantly below patient's typical baseline of being Independent, and pt also functioned with increased independence on 08/03/20 with physical therapy when she was more alert.  Pt's RN stated that all day today pt has been in and out of lethargy.  During this evaluation, patient was limited by lethargy, LT foot pain and edema, weakness and confusion possibly due to current state of lethargy.  Current limitations have the potential to impact patient's safety and independence during functional mobility, as well as performance for ADLs. [employer] AM-PAC "6-clicks" Daily Activity Inpatient Short Form score of 6/24 indicates 100% ADL impairment this session. Patient lives with her son in law and grandchild, but only PRN supervision and assistance is available at this time.  Patient demonstrates fair rehab potential (good if can increase level of alertness), and should benefit from continued skilled occupational therapy services while in acute care to maximize safety, independence and quality of life at home.  Whether pt requires continued occupational therapy services after hospitalization is TBD.  ?    Follow Up Recommendations   (TBD. Large discrepancy between function with PT on 4/29 and function with OT on 4/30.)    Equipment Recommendations  3 in 1 bedside commode     Recommendations for Other Services       Precautions / Restrictions Precautions Precautions: Fall Precaution Comments: watch sats Restrictions Weight Bearing Restrictions: No      Mobility Bed Mobility Overal bed mobility: Needs Assistance Bed Mobility: Supine to Sit;Sit to Supine     Supine to sit: Max assist;HOB elevated Sit to supine: Max assist;HOB elevated   General bed mobility comments: Multimodal cues for all bed mobility, use of rail, HOB elevated, increased time/effort.    Transfers                 General transfer comment: Unable to safely attempt stnd as pt lethargic with poor sitting balance at EOB and not following istructions suffiently to attempt.    Balance Overall balance assessment: Needs assistance Sitting-balance support: No upper extremity supported Sitting balance-Leahy Scale: Poor Sitting balance - Comments: Due to lethargy.       Standing balance comment: Unable to test.                           ADL either performed or assessed with clinical judgement   ADL Overall ADL's : Needs assistance/impaired   Eating/Feeding Details (indicate cue type and reason): Too letharguc to eat. Grooming: Total assistance;Sitting Grooming Details (indicate cue type and reason): To comb hair. Upper Body Bathing: Total assistance   Lower Body Bathing: Total assistance   Upper Body Dressing : Total assistance   Lower Body Dressing: Total assistance   Toilet Transfer: Total assistance Toilet Transfer Details (indicate cue type and reason): Pt on pure wick. Reported "I need to pee"  Reinforced use of pure wick and pt voided at bed level as too lethargic to safely pivot to [facility]. Toileting- Clothing Manipulation and Hygiene: Total assistance     Tub/Shower Transfer Details (indicate cue type and reason): NT Functional mobility during ADLs: Maximal assistance (Bed only)       Vision   Additional Comments: Unable to test due to pt  lethargy. Pt fleetingly opening eyes. Agreed to EOB, but keeping eyes closed.     Perception     Praxis      Pertinent Vitals/Pain Pain Assessment: Faces Faces Pain Scale: Hurts whole lot Pain Location: feet - neuropathy, not constant Pain Descriptors / Indicators: Grimacing;Other (Comment) (Groaning when LEs assisted back up onto bed.) Pain Intervention(s): Limited activity within patient's tolerance;Monitored during session;Repositioned (elevated each LE on pillow with heels floating)     Hand Dominance Right   Extremity/Trunk Assessment Upper Extremity Assessment Upper Extremity Assessment: Overall WFL for tasks assessed   Lower Extremity Assessment Lower Extremity Assessment: Defer to PT evaluation RLE Deficits / Details: + edema, +peripheral neuropathy with chronic pain that limited ankle ROM and MMT - however, ROM was functional and at least 3/5 strength throughout LLE Deficits / Details: + edema, +peripheral neuropathy with chronic pain that limited ankle ROM and MMT - however, ROM was functional and at least 3/5 strength throughout   Cervical / Trunk Assessment Cervical / Trunk Assessment: Normal (Slumped at EOB, lethargic.)   Communication Communication Communication: Other (comment) (All above information gathered from PT Evaluation on 4/29. This visit, pt very lethargic, and unable to answer questions beyond name and DOB with incraesed time and cues.)   Cognition Arousal/Alertness: Lethargic Behavior During Therapy: Impulsive Overall Cognitive Status: Impaired/Different from baseline Area of Impairment: Orientation;Attention;Following commands;Safety/judgement;Awareness                 Orientation Level: Place;Time;Situation;Disoriented to Current Attention Level:  (zero due to lethargy)   Following Commands: Follows one step commands inconsistently Safety/Judgement: Decreased awareness of safety Awareness: Intellectual       General Comments        Exercises     Shoulder Instructions      Home Living Family/patient expects to be discharged to:: Private residence Living Arrangements: Other (Comment);Other relatives (Son in Social worker and grandchild) Available Help at Discharge: Family;Available PRN/intermittently (Son in law works. Friends may be able to contribute time.) Type of Home: Apartment Home Access: Level entry     Home Layout: One level     Bathroom Shower/Tub: Chief Strategy Officer: Handicapped height     Home Equipment: Grab bars - tub/shower;Walker - 2 wheels;Walker - 4 wheels          Prior Functioning/Environment Level of Independence: Independent with assistive device(s)  Gait / Transfers Assistance Needed: Able to ambulate using rollator due to decreased endurance; on a normal day could ambulate in community without rest breaks ADL's / Homemaking Assistance Needed: Pt independent with ADLs and IADLs but reports difficulty due to fatigue   Comments: Pt is not on home O2 at this time        OT Problem List: Pain;Decreased cognition;Decreased range of motion;Decreased safety awareness;Decreased activity tolerance;Impaired balance (sitting and/or standing);Decreased knowledge of use of DME or AE;Decreased knowledge of precautions;Decreased strength      OT Treatment/Interventions: Self-care/ADL training;Therapeutic exercise;Therapeutic activities;Cognitive remediation/compensation;Energy conservation;Patient/family education;DME and/or AE instruction;Balance training    OT Goals(Current goals can be found in the care plan section) Acute Rehab OT Goals Patient Stated  Goal: Not lose foot OT Goal Formulation: With patient ADL Goals Pt Will Perform Grooming: with modified independence;standing Pt Will Perform Upper Body Bathing: with modified independence;sitting Pt Will Perform Lower Body Bathing: with modified independence;with adaptive equipment;sitting/lateral leans;sit to/from stand Pt Will  Perform Lower Body Dressing: with modified independence;with adaptive equipment;sitting/lateral leans;sit to/from stand Pt Will Transfer to Toilet: with modified independence;ambulating;bedside commode Pt Will Perform Toileting - Clothing Manipulation and hygiene: with modified independence;sitting/lateral leans;sit to/from stand Additional ADL Goal #1: Pt will engage in at least 5 min standing functional activities without loss of balance, in order to demonstrate improved activity tolerance and balance needed to perform ADLs safely at home.  OT Frequency: Min 2X/week   Barriers to D/C: Decreased caregiver support  Only PRN available right now for support.       Co-evaluation              AM-PAC OT "6 Clicks" Daily Activity     Outcome Measure Help from another person eating meals?: Total (NPO) Help from another person taking care of personal grooming?: Total Help from another person toileting, which includes using toliet, bedpan, or urinal?: Total Help from another person bathing (including washing, rinsing, drying)?: Total Help from another person to put on and taking off regular upper body clothing?: Total Help from another person to put on and taking off regular lower body clothing?: Total 6 Click Score: 6   End of Session Equipment Utilized During Treatment: Oxygen Nurse Communication: Mobility status (Lethargy and hot to touch)  Activity Tolerance: Patient limited by lethargy Patient left: in bed;with call bell/phone within reach;with bed alarm set  OT Visit Diagnosis: Other symptoms and signs involving cognitive function;Pain Pain - Right/Left: Left Pain - part of body: Ankle and joints of foot                Time: 4403-4742 OT Time Calculation (min): 17 min Charges:  OT General Charges $OT Visit: 1 Visit OT Evaluation $OT Eval Low Complexity: 1 Low  Nihar Klus, OT Acute Rehab Services Office: [phone] 08/04/2020  Theodoro Clock 08/04/2020, 2:38 PM

## 2020-08-04 NOTE — Progress Notes (Signed)
  Echocardiogram 2D Echocardiogram has been performed.  Shirley Chen 08/04/2020, 1:01 PM

## 2020-08-05 ENCOUNTER — Encounter (HOSPITAL_COMMUNITY): Payer: Self-pay | Admitting: Podiatry

## 2020-08-05 ENCOUNTER — Inpatient Hospital Stay (HOSPITAL_COMMUNITY): Payer: Medicare Other

## 2020-08-05 DIAGNOSIS — I5031 Acute diastolic (congestive) heart failure: Secondary | ICD-10-CM

## 2020-08-05 DIAGNOSIS — I151 Hypertension secondary to other renal disorders: Secondary | ICD-10-CM | POA: Diagnosis not present

## 2020-08-05 DIAGNOSIS — M726 Necrotizing fasciitis: Secondary | ICD-10-CM

## 2020-08-05 DIAGNOSIS — J9601 Acute respiratory failure with hypoxia: Secondary | ICD-10-CM | POA: Diagnosis not present

## 2020-08-05 DIAGNOSIS — E1151 Type 2 diabetes mellitus with diabetic peripheral angiopathy without gangrene: Secondary | ICD-10-CM | POA: Diagnosis not present

## 2020-08-05 DIAGNOSIS — N179 Acute kidney failure, unspecified: Secondary | ICD-10-CM

## 2020-08-05 LAB — PHOSPHORUS: Phosphorus: 3.5 mg/dL (ref 2.5–4.6)

## 2020-08-05 LAB — CBC WITH DIFFERENTIAL/PLATELET
Abs Immature Granulocytes: 1.28 10*3/uL — ABNORMAL HIGH (ref 0.00–0.07)
Basophils Absolute: 0.1 10*3/uL (ref 0.0–0.1)
Basophils Relative: 0 %
Eosinophils Absolute: 0 10*3/uL (ref 0.0–0.5)
Eosinophils Relative: 0 %
HCT: 29.8 % — ABNORMAL LOW (ref 36.0–46.0)
Hemoglobin: 9.5 g/dL — ABNORMAL LOW (ref 12.0–15.0)
Immature Granulocytes: 5 %
Lymphocytes Relative: 4 %
Lymphs Abs: 1 10*3/uL (ref 0.7–4.0)
MCH: 30.4 pg (ref 26.0–34.0)
MCHC: 31.9 g/dL (ref 30.0–36.0)
MCV: 95.2 fL (ref 80.0–100.0)
Monocytes Absolute: 1.7 10*3/uL — ABNORMAL HIGH (ref 0.1–1.0)
Monocytes Relative: 7 %
Neutro Abs: 21.7 10*3/uL — ABNORMAL HIGH (ref 1.7–7.7)
Neutrophils Relative %: 84 %
Platelets: 360 10*3/uL (ref 150–400)
RBC: 3.13 MIL/uL — ABNORMAL LOW (ref 3.87–5.11)
RDW: 14.2 % (ref 11.5–15.5)
WBC: 25.8 10*3/uL — ABNORMAL HIGH (ref 4.0–10.5)
nRBC: 0 % (ref 0.0–0.2)

## 2020-08-05 LAB — BPAM RBC
Blood Product Expiration Date: 202205182359
ISSUE DATE / TIME: 202204301657
Unit Type and Rh: 6200

## 2020-08-05 LAB — TYPE AND SCREEN
ABO/RH(D): A POS
Antibody Screen: NEGATIVE
Unit division: 0

## 2020-08-05 LAB — COMPREHENSIVE METABOLIC PANEL
ALT: 16 U/L (ref 0–44)
AST: 14 U/L — ABNORMAL LOW (ref 15–41)
Albumin: 2.4 g/dL — ABNORMAL LOW (ref 3.5–5.0)
Alkaline Phosphatase: 128 U/L — ABNORMAL HIGH (ref 38–126)
Anion gap: 9 (ref 5–15)
BUN: 56 mg/dL — ABNORMAL HIGH (ref 8–23)
CO2: 20 mmol/L — ABNORMAL LOW (ref 22–32)
Calcium: 8.7 mg/dL — ABNORMAL LOW (ref 8.9–10.3)
Chloride: 113 mmol/L — ABNORMAL HIGH (ref 98–111)
Creatinine, Ser: 1.66 mg/dL — ABNORMAL HIGH (ref 0.44–1.00)
GFR, Estimated: 34 mL/min — ABNORMAL LOW (ref >60)
Glucose, Bld: 187 mg/dL — ABNORMAL HIGH (ref 70–99)
Potassium: 3.5 mmol/L (ref 3.5–5.1)
Sodium: 142 mmol/L (ref 135–145)
Total Bilirubin: 0.9 mg/dL (ref 0.3–1.2)
Total Protein: 6.3 g/dL — ABNORMAL LOW (ref 6.5–8.1)

## 2020-08-05 LAB — BLOOD GAS, ARTERIAL
Acid-base deficit: 4.4 mmol/L — ABNORMAL HIGH (ref 0.0–2.0)
Bicarbonate: 20.4 mmol/L (ref 20.0–28.0)
FIO2: 44
O2 Saturation: 74 %
Patient temperature: 98.6
pCO2 arterial: 39 mmHg (ref 32.0–48.0)
pH, Arterial: 7.34 — ABNORMAL LOW (ref 7.350–7.450)
pO2, Arterial: 41.3 mmHg — ABNORMAL LOW (ref 83.0–108.0)

## 2020-08-05 LAB — MAGNESIUM: Magnesium: 2.3 mg/dL (ref 1.7–2.4)

## 2020-08-05 LAB — GLUCOSE, CAPILLARY
Glucose-Capillary: 187 mg/dL — ABNORMAL HIGH (ref 70–99)
Glucose-Capillary: 199 mg/dL — ABNORMAL HIGH (ref 70–99)
Glucose-Capillary: 176 mg/dL — ABNORMAL HIGH (ref 70–99)
Glucose-Capillary: 155 mg/dL — ABNORMAL HIGH (ref 70–99)

## 2020-08-05 LAB — LACTIC ACID, PLASMA: Lactic Acid, Venous: 0.9 mmol/L (ref 0.5–1.9)

## 2020-08-05 LAB — BRAIN NATRIURETIC PEPTIDE: B Natriuretic Peptide: 501.4 pg/mL — ABNORMAL HIGH (ref 0.0–100.0)

## 2020-08-05 LAB — PROCALCITONIN: Procalcitonin: 0.47 ng/mL

## 2020-08-05 MED ORDER — ORAL CARE MOUTH RINSE
15.0000 mL | Freq: Two times a day (BID) | OROMUCOSAL | Status: DC
  Administered 2020-08-05 – 2020-08-11 (×13): 15 mL via OROMUCOSAL

## 2020-08-05 MED ORDER — CHLORHEXIDINE GLUCONATE CLOTH 2 % EX PADS
6.0000 | MEDICATED_PAD | Freq: Every day | CUTANEOUS | Status: DC
  Administered 2020-08-05 – 2020-08-08 (×3): 6 via TOPICAL

## 2020-08-05 MED ORDER — LABETALOL HCL 5 MG/ML IV SOLN
10.0000 mg | INTRAVENOUS | Status: DC | PRN
  Administered 2020-08-05 – 2020-08-07 (×8): 10 mg via INTRAVENOUS
  Filled 2020-08-05 (×8): qty 4

## 2020-08-05 MED ORDER — AZITHROMYCIN 250 MG PO TABS
250.0000 mg | ORAL_TABLET | Freq: Every day | ORAL | Status: AC
  Administered 2020-08-06 – 2020-08-07 (×2): 250 mg via ORAL
  Filled 2020-08-05 (×2): qty 1

## 2020-08-05 MED ORDER — AMLODIPINE BESYLATE 10 MG PO TABS
10.0000 mg | ORAL_TABLET | Freq: Every day | ORAL | Status: DC
  Administered 2020-08-05 – 2020-08-11 (×7): 10 mg via ORAL
  Filled 2020-08-05 (×7): qty 1

## 2020-08-05 MED ORDER — FUROSEMIDE 10 MG/ML IJ SOLN
40.0000 mg | Freq: Two times a day (BID) | INTRAMUSCULAR | Status: DC
  Administered 2020-08-05 – 2020-08-09 (×8): 40 mg via INTRAVENOUS
  Filled 2020-08-05 (×8): qty 4

## 2020-08-05 MED ORDER — POTASSIUM CHLORIDE 10 MEQ/100ML IV SOLN
10.0000 meq | INTRAVENOUS | Status: AC
  Administered 2020-08-05 (×3): 10 meq via INTRAVENOUS
  Filled 2020-08-05 (×3): qty 100

## 2020-08-05 MED ORDER — FUROSEMIDE 10 MG/ML IJ SOLN
40.0000 mg | Freq: Once | INTRAMUSCULAR | Status: AC
  Administered 2020-08-05: 40 mg via INTRAVENOUS
  Filled 2020-08-05: qty 4
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
## 2020-08-05 NOTE — Op Note (Signed)
Surgeon: Surgeon(s): Candelaria Stagers, DPM  Assistants: None Pre-operative diagnosis: LEFT FOOT ABSCESS  Post-operative diagnosis: same Procedure: Procedure(s) (LRB): IRRIGATION AND DEBRIDEMENT LEFT FOOT WOUND (Left) with incision and drainage Pathology:  ID Type Source Tests Collected by Time Destination  A : left foot wound Wound Wound AEROBIC/ANAEROBIC CULTURE W GRAM STAIN (SURGICAL/DEEP WOUND) Candelaria Stagers, DPM 08/04/2020 1717     Pertinent Intra-op findings: Severe purulent drainage was noted across the dorsal midfoot.  All of the purulent drainage was thoroughly decompressed.  Cultures were taken Anesthesia: Choice  Hemostasis: * No tourniquets in log * EBL: 10 mL  Materials: Iodoform packing Injectables: None Complications: None  Indications for surgery: A 65 y.o. female presents with left foot abscess/necrotizing fasciitis. Patient has failed all conservative therapy including but not limited to local wound care and IV antibiotics. He wishes to have surgical correction of the foot/deformity. It was determined that patient would benefit from left foot incision and drainage washout debridement of the abscess. Informed surgical risk consent was reviewed and read aloud to the patient.  I reviewed the films.  I have discussed my findings with the patient in great detail.  I have discussed all risks including but not limited to infection, stiffness, scarring, limp, disability, deformity, damage to blood vessels and nerves, numbness, poor healing, need for braces, arthritis, chronic pain, amputation, death.  All benefits and realistic expectations discussed in great detail.  I have made no promises as to the outcome.  I have provided realistic expectations.  I have offered the patient a 2nd opinion, which they have declined and assured me they preferred to proceed despite the risks   Procedure in detail: The patient was both verbally and visually identified by myself, the nursing staff, and  anesthesia staff in the preoperative holding area. They were then transferred to the operating room and placed on the operative table in supine position.  No tourniquet was utilized as this is currently infected.  Attention was directed dorsal midfoot, a dorsal incision was delineated using a skin marker from midfoot to the first interspace.  Using 15 blade, the incision was carried down to deep tissue.  At this time severe amount of purulent drainage was expressed.  Using mosquito all the tissue planes were thoroughly decompressed and all the purulent drainage was decompressed.  Culture was taken and sent to microbiology in standard technique approximately 20 cc of purulent drainage was removed.  Once the area appeared clear of infection 3 L saline solution was used to irrigate via pulse lavage.  At this time no further purulent drainage was noted after the pulse lavage.  Decision was made to pack it open we will plan on coming back as part of a staged procedure to perform delayed primary closure. The wound was packed with iodoform packing Kerlix Ace bandage.  We will plan on returning back to the operating room on Monday for delayed primary closure.  All bony prominences were adequately padded.  At the conclusion of the procedure the patient was awoken from anesthesia and found to have tolerated the procedure well any complications. There were transferred to PACU with vital signs stable and vascular status intact.  Nicholes Rough, DPM

## 2020-08-05 NOTE — Consult Note (Signed)
[facility]    Date of Admission:  08/01/2020     Reason for Consult: severe sepsis, diabetic foot infection    Referring Provider: Alanda Slim   Lines:  Peripheral iv's  Abx: 4/30-c linezolid 4/29-c azith 4/27-c metronidazole 4/27-c cefepime  4/27-28 vanc       Assessment/plan Left diabetic foot infection Acute hypoxic respiratory failure; presumed pna aki improving Fluid overload  65 yo female dm2 admitted with severe sepsis in setting left diabetic foot infection. Mri suggest 3rd mt om vs bone marrow reaction. Course complicated by increased respiratory hypoxemic respiratory distress requiring transferring to ICU for monitoring on hd#3  #severe sepsis #diabetic foot infection Appears an acute process 4/30 S/p  I&D left foot by podiatry; only soft tissue I&D. No primary closure  Mri reviewed. Possibly no OM at this time although this can be difficult to say without longitudinal follow up 4/28 bcx ngtd  -f/u final blood culture -f/u wound cx; initial 4/30 cx show gpc in pairs -continue linezolid/cefepime/flagyl for now    #hypox resp distress presumed pneumonia #cxr R  Perihilar opacity #volume overload #disatolic heart failure Initial xray on admission without abnormality Technically >3 days wouldn't consider atypical coverage and define as HAP. ?aspirate I suspect there is also a component of volume overload playing a role  -already on azith for 3 days reasonable to finish 2 more days -monitor on above abx -fluid management per primary team     I spent 60 minute reviewing data/chart, and coordinating care and >50% direct face to face time providing counseling/discussing diagnostics/treatment plan with patient    ------------------------------------------------ Principal Problem:   Acute respiratory failure with hypoxia (HCC) Active Problems:   Tobacco abuse   PAD (peripheral artery disease) (HCC)   HTN  (hypertension)   Diabetes mellitus with peripheral vascular disease (HCC)   AKI (acute kidney injury) (HCC)   Necrotizing fasciitis (HCC)    HPI: Shirley Chen is a 65 y.o. female dm2, htn, ckd 3, pvd, tobacco use admitted 4/27 for 4 days left foot pain/redness/swelling, found to have severe diabetic foot infection, course complicated by hypoxemic resp failure  The history taking was difficult due to patient's being somewhat confused. Hx via nursing staff discussion and chart  Patient initially came in complaining of foot sx and sob for a few days.   Initially was placed on 3liters on admission; afebrile; leukocytosis 24. vq scan negative. Mri foot showed ssi and bone marrow edema. Given fluid bolus as well for sepsis. Chest ct no parenchymal acute disease  Due to concern for nec fasc, patient taken to or for I&D on 4/30 with wound left open. I reviewed the op note. Debridement not done down to the bone  ?before vs after taken to OR, increased o2 need transferred to ed for monitoring. Repeat cxr HD#5 showed mild increased right perihilar opacity  Initial abx vanc/cefepime/flagyl --> linezolid/cefepime/flagyl. azith added 5/01 with the new cxr finding. Her acute increased o2 need already resolved prior  Right now she is somewhat confused. She reports a little short of breath and left foot pain but no other sx on open ended question   Family History  Problem Relation Age of Onset  . Diabetes Mother   . Alcohol abuse Father   . Hyperlipidemia Father   . Heart disease Father   . Hypertension Father   . Heart attack Father        Late 62s  .  Lung cancer Brother     Social History   Tobacco Use  . Smoking status: Current Every Day Smoker    Packs/day: 0.25    Years: 49.00    Pack years: 12.25    Types: Cigarettes    Last attempt to quit: 11/08/2014    Years since quitting: 5.7  . Smokeless tobacco: Never Used  Vaping Use  . Vaping Use: Never used  Substance Use Topics  .  Alcohol use: No    Alcohol/week: 0.0 standard drinks  . Drug use: No    Allergies  Allergen Reactions  . Losartan Nausea And Vomiting  . Morphine And Related     Pt states " I just got really sick"    Review of Systems: ROS All Other ROS was negative, except mentioned above   Past Medical History:  Diagnosis Date  . Arthritis    "joints in hands and right leg ache" (10/20/2014)  . Cellulitis of right foot 10/2013  . Constipation   . Diabetes mellitus without complication (HCC)    Type II  . Headache    "maybe weekly" (10/20/2014)  . Hyperlipidemia   . Hypertension   . Tobacco abuse   . UTI (lower urinary tract infection)        Scheduled Meds: . sodium chloride   Intravenous Once  . amLODipine  10 mg Oral Daily  . atorvastatin  40 mg Oral Daily  . azithromycin  500 mg Oral Daily  . carvedilol  25 mg Oral BID WC  . Chlorhexidine Gluconate Cloth  6 each Topical Daily  . colchicine  0.6 mg Oral Daily  . ezetimibe  10 mg Oral Daily  . furosemide  40 mg Intravenous BID  . gabapentin  300 mg Oral BID  . heparin  5,000 Units Subcutaneous Q8H  . insulin aspart  0-15 Units Subcutaneous TID WC  . insulin aspart  0-5 Units Subcutaneous QHS  . mouth rinse  15 mL Mouth Rinse BID   Continuous Infusions: . ceFEPime (MAXIPIME) IV Stopped (08/05/20 1523)  . linezolid (ZYVOX) IV Stopped (08/05/20 1052)  . metronidazole Stopped (08/05/20 1225)   PRN Meds:.acetaminophen **OR** acetaminophen, labetalol, traMADol   OBJECTIVE: Blood pressure (!) 189/60, pulse 72, temperature 98.5 F (36.9 C), temperature source Oral, resp. rate 19, height  (1.6 m), weight 108.6 kg, SpO2 100 %.  Physical Exam General/constitutional: moaning but unable to tell me what is bothering her, cooperative, appears slightly confused, not following complex commands HEENT: Normocephalic, PER, Conj Clear, EOMI, Oropharynx clear Neck supple CV: rrr no mrg Lungs: clear to auscultation, normal  respiratory effort Abd: Soft, Nontender Ext: trace bilateral LE edema with wrinkling of skin Skin: No Rash Neuro: nonfocal; generalized weakness Psych: drowsy, oriented to self MSK: right halux old amputation; left foot in bulky dressing; no proximal erythema   Lab Results Lab Results  Component Value Date   WBC 25.8 (H) 08/05/2020   HGB 9.5 (L) 08/05/2020   HCT 29.8 (L) 08/05/2020   MCV 95.2 08/05/2020   PLT 360 08/05/2020    Lab Results  Component Value Date   CREATININE 1.66 (H) 08/05/2020   BUN 56 (H) 08/05/2020   NA 142 08/05/2020   K 3.5 08/05/2020   CL 113 (H) 08/05/2020   CO2 20 (L) 08/05/2020    Lab Results  Component Value Date   ALT 16 08/05/2020   AST 14 (L) 08/05/2020   ALKPHOS 128 (H) 08/05/2020   BILITOT 0.9 08/05/2020  Microbiology: Recent Results (from the past 240 hour(s))  Culture, blood (routine x 2)     Status: None   Collection Time: 07/30/20  2:55 AM   Specimen: BLOOD LEFT ARM  Result Value Ref Range Status   Specimen Description BLOOD LEFT ARM  Final   Special Requests   Final    BOTTLES DRAWN AEROBIC AND ANAEROBIC Blood Culture adequate volume   Culture   Final    NO GROWTH 5 DAYS Performed at [facility], [address] [place], [place] [address]    Report Status 08/04/2020 FINAL  Final  Culture, blood (routine x 2)     Status: None   Collection Time: 07/30/20  2:58 AM   Specimen: BLOOD LEFT HAND  Result Value Ref Range Status   Specimen Description BLOOD LEFT HAND  Final   Special Requests   Final    BOTTLES DRAWN AEROBIC AND ANAEROBIC Blood Culture adequate volume   Culture   Final    NO GROWTH 5 DAYS Performed at [facility], [address] [place], [place] [address]    Report Status 08/04/2020 FINAL  Final  Resp Panel by RT-PCR (Flu A&B, Covid) Nasopharyngeal Swab     Status: None   Collection Time: 07/30/20  3:05 AM   Specimen: Nasopharyngeal Swab; Nasopharyngeal(NP) swabs in vial transport medium   Result Value Ref Range Status   SARS Coronavirus 2 by RT PCR NEGATIVE NEGATIVE Final    Comment: (NOTE) SARS-CoV-2 target nucleic acids are NOT DETECTED.  The SARS-CoV-2 RNA is generally detectable in upper respiratory specimens during the acute phase of infection. The lowest concentration of SARS-CoV-2 viral copies this assay can detect is 138 copies/mL. A negative result does not preclude SARS-Cov-2 infection and should not be used as the sole basis for treatment or other patient management decisions. A negative result may occur with  improper specimen collection/handling, submission of specimen other than nasopharyngeal swab, presence of viral mutation(s) within the areas targeted by this assay, and inadequate number of viral copies(<138 copies/mL). A negative result must be combined with clinical observations, patient history, and epidemiological information. The expected result is Negative.  Fact Sheet for Patients:  BloggerCourse.comhttps://www.fda.gov/media/152166/download  Fact Sheet for Healthcare Providers:  SeriousBroker.ithttps://www.fda.gov/media/152162/download  This test is no t yet approved or cleared by the [place] FDA and  has been authorized for detection and/or diagnosis of SARS-CoV-2 by FDA under an Emergency Use Authorization (EUA). This EUA will remain  in effect (meaning this test can be used) for the duration of the COVID-19 declaration under Section 564(b)(1) of the Act, 21 U.S.C.section 360bbb-3(b)(1), unless the authorization is terminated  or revoked sooner.       Influenza A by PCR NEGATIVE NEGATIVE Final   Influenza B by PCR NEGATIVE NEGATIVE Final    Comment: (NOTE) The Xpert Xpress SARS-CoV-2/FLU/RSV plus assay is intended as an aid in the diagnosis of influenza from Nasopharyngeal swab specimens and should not be used as a sole basis for treatment. Nasal washings and aspirates are unacceptable for Xpert Xpress SARS-CoV-2/FLU/RSV testing.  Fact Sheet for  Patients: BloggerCourse.comhttps://www.fda.gov/media/152166/download  Fact Sheet for Healthcare Providers: SeriousBroker.ithttps://www.fda.gov/media/152162/download  This test is not yet approved or cleared by the [place] FDA and has been authorized for detection and/or diagnosis of SARS-CoV-2 by FDA under an Emergency Use Authorization (EUA). This EUA will remain in effect (meaning this test can be used) for the duration of the COVID-19 declaration under Section 564(b)(1) of the Act, 21  U.S.C. section 360bbb-3(b)(1), unless the authorization is terminated or revoked.  Performed at [facility], [address] [place], [place] [address]   Urine culture     Status: Abnormal   Collection Time: 07/30/20 11:42 AM   Specimen: Urine, Clean Catch  Result Value Ref Range Status   Specimen Description URINE, CLEAN CATCH  Final   Special Requests NONE  Final   Culture (A)  Final    <10,000 COLONIES/mL INSIGNIFICANT GROWTH Performed at [facility], [address], [place], [place] [address]    Report Status 07/31/2020 FINAL  Final  Blood Culture (routine x 2)     Status: None (Preliminary result)   Collection Time: 08/02/20  1:00 AM   Specimen: BLOOD  Result Value Ref Range Status   Specimen Description   Final    BLOOD BLOOD LEFT FOREARM Performed at [facility], [address], [place], [place] [address]    Special Requests   Final    BOTTLES DRAWN AEROBIC AND ANAEROBIC Blood Culture results may not be optimal due to an inadequate volume of blood received in culture bottles Performed at [facility], [address], [place], [place] [address]    Culture   Final    NO GROWTH 2 DAYS Performed at [facility], [address] [place], [place] [address]    Report Status PENDING  Incomplete  Blood Culture (routine x 2)     Status: None (Preliminary result)   Collection Time: 08/02/20  1:00 AM   Specimen: BLOOD  Result Value Ref Range Status    Specimen Description   Final    BLOOD BLOOD RIGHT FOREARM Performed at [facility], [address], [place], [place] [address]    Special Requests   Final    BOTTLES DRAWN AEROBIC AND ANAEROBIC Blood Culture adequate volume Performed at [facility], [address], [place], [place] [address]    Culture   Final    NO GROWTH 2 DAYS Performed at [facility], [address] [place], [place] [address]    Report Status PENDING  Incomplete  Resp Panel by RT-PCR (Flu A&B, Covid) Nasopharyngeal Swab     Status: None   Collection Time: 08/02/20  2:32 AM   Specimen: Nasopharyngeal Swab; Nasopharyngeal(NP) swabs in vial transport medium  Result Value Ref Range Status   SARS Coronavirus 2 by RT PCR NEGATIVE NEGATIVE Final    Comment: (NOTE) SARS-CoV-2 target nucleic acids are NOT DETECTED.  The SARS-CoV-2 RNA is generally detectable in upper respiratory specimens during the acute phase of infection. The lowest concentration of SARS-CoV-2 viral copies this assay can detect is 138 copies/mL. A negative result does not preclude SARS-Cov-2 infection and should not be used as the sole basis for treatment or other patient management decisions. A negative result may occur with  improper specimen collection/handling, submission of specimen other than nasopharyngeal swab, presence of viral mutation(s) within the areas targeted by this assay, and inadequate number of viral copies(<138 copies/mL). A negative result must be combined with clinical observations, patient history, and epidemiological information. The expected result is Negative.  Fact Sheet for Patients:  BloggerCourse.com  Fact Sheet for Healthcare Providers:  SeriousBroker.it  This test is no t yet approved or cleared by the [place] FDA and  has been authorized for detection and/or diagnosis of SARS-CoV-2 by FDA under an Emergency  Use Authorization (EUA). This EUA will remain  in effect (meaning this test can be used) for the duration of the COVID-19 declaration under Section 564(b)(1) of the Act, 21 U.S.C.section 360bbb-3(b)(1), unless the authorization is terminated  or revoked sooner.       Influenza A by PCR NEGATIVE NEGATIVE Final   Influenza B by PCR NEGATIVE NEGATIVE Final    Comment: (NOTE) The Xpert Xpress SARS-CoV-2/FLU/RSV plus assay is intended as an aid in the diagnosis of influenza from Nasopharyngeal swab specimens and should not be used as a sole basis for treatment. Nasal washings and aspirates are unacceptable for Xpert Xpress SARS-CoV-2/FLU/RSV testing.  Fact Sheet for Patients: BloggerCourse.com  Fact Sheet for Healthcare Providers: SeriousBroker.it  This test is not yet approved or cleared by the [place] FDA and has been authorized for detection and/or diagnosis of SARS-CoV-2 by FDA under an Emergency Use Authorization (EUA). This EUA will remain in effect (meaning this test can be used) for the duration of the COVID-19 declaration under Section 564(b)(1) of the Act, 21 U.S.C. section 360bbb-3(b)(1), unless the authorization is terminated or revoked.  Performed at [facility], [address], [place], [place] [address]   Urine culture     Status: None   Collection Time: 08/02/20  5:40 AM   Specimen: In/Out Cath Urine  Result Value Ref Range Status   Specimen Description   Final    IN/OUT CATH URINE Performed at [facility], [address] [place], [place] [address]    Special Requests   Final    NONE Performed at [facility], [address] [place], [place] [address]    Culture   Final    NO GROWTH Performed at [facility], [address] [place], [place] [address]    Report Status 08/03/2020 FINAL  Final  MRSA PCR Screening     Status: None    Collection Time: 08/02/20  9:05 AM   Specimen: Nasopharyngeal  Result Value Ref Range Status   MRSA by PCR NEGATIVE NEGATIVE Final    Comment:        The GeneXpert MRSA Assay (FDA approved for NASAL specimens only), is one component of a comprehensive MRSA colonization surveillance program. It is not intended to diagnose MRSA infection nor to guide or monitor treatment for MRSA infections. Performed at [facility], [address] [place], [place] [address]   Aerobic/Anaerobic Culture w Gram Stain (surgical/deep wound)     Status: None (Preliminary result)   Collection Time: 08/04/20  5:17 PM   Specimen: Wound  Result Value Ref Range Status   Specimen Description ABSCESS LEFT FOOT  Final   Special Requests NONE  Final   Gram Stain   Final    RARE WBC PRESENT,BOTH PMN AND MONONUCLEAR RARE GRAM POSITIVE COCCI IN PAIRS    Culture   Final    NO GROWTH < 12 HOURS Performed at [facility], [address] [place], [place] [address]    Report Status PENDING  Incomplete     Serology:    Imaging: If present, new imagings (plain films, ct scans, and mri) have been personally visualized and interpreted; radiology reports have been reviewed. Decision making incorporated into the Impression / Recommendations.  5/01 cxr Some increasing hazy opacities particularly through the right hemithorax and bilateral lung bases. Could reflect worsening atelectasis, developing airspace disease or atypical infection.  Sequela of prior granulomatous disease.  4/30 tte 1. Left ventricular ejection fraction, by estimation, is  60 to 65%. The  left ventricle has normal function. The left ventricle has no regional  wall motion abnormalities. Left ventricular diastolic parameters are  consistent with Grade II diastolic  dysfunction (pseudonormalization).  2. Right ventricular systolic function is normal. The right ventricular  size is normal.  3. Left atrial size  was mildly dilated.  4. The mitral valve is normal in structure. Trivial mitral valve  regurgitation. No evidence of mitral stenosis.  5. The aortic valve is normal in structure. Aortic valve regurgitation is  not visualized. No aortic stenosis is present.  6. The inferior vena cava is dilated in size with >50% respiratory  variability, suggesting right atrial pressure of 8 mmHg.   4/30 mr left foot 1. Severe soft edema throughout the foot with air in the dorsal aspect of the forefoot tracking proximally to the level of the TMT joints most concerning for necrotizing infection. 2. Minimal marrow edema at the base of the third proximal phalanx without cortical destruction may reflect reactive marrow edema versus early osteomyelitis.  4/29 u/s renal No acute abnormality  4/28 chest ct  1. No evidence of active pulmonary disease. Calcified granulomas in the left lung and left hilum. 2. Aortic atherosclerosis   Raymondo Band, MD [facility] [phone] pager    08/05/2020, 4:10 PM

## 2020-08-05 NOTE — Progress Notes (Signed)
Rapid Response Event Note   Reason for Call : pt confused and lethargic   Initial Focused Assessment: Found pt to be confused and lethargic as well. Pupils and pulses intact.  Breath sounds decreased, increased 02 demand from 5 L [place] to 7 L [place] sats above 95%.  Pt screams out in discomfort as she uses her purewick. (suctiobn intact and adequate).  Urine yellow.  Pt has received general anesthesia for left foot surgery. VSS.   Interventions:   TRIAD, MD asked to come to the bedside and evaluate pt. New orders received and initiated.     Plan of Care: Pt to transfer to SDU  For closer monitoring.   Conley Rolls, RN

## 2020-08-05 NOTE — Progress Notes (Signed)
PROGRESS NOTE  Shirley Chen DUK:[id number] DOB: 10-Oct-1955   PCP: Marrian Salvage, FNP  Patient is from: Home  DOA: 08/01/2020 LOS: 3  Chief complaints: Shortness of breath  Brief Narrative / Interim history: 65 year old F with PMH of DM-2, HTN, CKD-3, PVD, HLD and tobacco abuse presenting with progressive shortness of breath, lower extremity edema and nausea and admitted with SIRS, acute respiratory failure with hypoxia and elevated D-dimer.  She was empirically started on broad-spectrum antibiotics and IV heparin pending cultures, VQ scan and LE [place].  CT chest without contrast without acute finding.  She also had mildly elevated troponin and BNP.   VQ scan with low probability for central PE.  LE [place] negative for DVT.  Heparin discontinued.  She continues to have significant O2 requirement.   Noted to have left foot swelling and erythema on 4/30.  MRI foot with severe soft edema throughout the foot with area and dorsal aspect of the forefoot tracking proximally to the level of the TMT joint most concerning for necrotizing infection and possible reactive marrow edema  Vs osteomyelitis within the third proximal phalanx.  ID and podiatry consulted.  Linezolid added.  Patient underwent incision and drainage by podiatry, Dr. Posey Pronto on 08/04/2020.  Subjective: Seen and examined earlier this morning.  Patient had increased oxygen demand overnight and started on NRB and moved to stepdown unit.  She also spiked fever to 100.8.  Currently saturating 100% on 11 L by NRB.  She admits shortness of breath but denies chest pain or foot pain.  She is more awake and alert.  She is oriented to self, place and month and to some extent to situation.  Objective: Vitals:   08/05/20 0500 08/05/20 0518 08/05/20 0600 08/05/20 0800  BP: (!) 145/53  (!) 141/68 (!) 171/66  Pulse: 72  71 72  Resp: _0 Temp:    98.5 F (36.9 C)  TempSrc:    Oral  SpO2: 100%  99% 100%  Weight:  108.6 kg    Height:         Intake/Output Summary (Last 24 hours) at 08/05/2020 1035 Last data filed at 08/05/2020 0727 Gross per 24 hour  Intake 2539.8 ml  Output 1160 ml  Net 1379.8 ml   Filed Weights   08/01/20 2345 08/03/20 0418 08/05/20 0518  Weight: 108.9 kg 109.4 kg 108.6 kg    Examination:  GENERAL: No apparent distress.  Nontoxic. HEENT: MMM.  Vision and hearing grossly intact.  NECK: Supple.  No apparent JVD.  RESP: 100% on NRB.  No IWOB.  Diminished aeration bilaterally partly due to poor respiratory effort. CVS:  RRR. Heart sounds normal.  ABD/GI/GU: BS+. Abd soft, NTND.  MSK/EXT:  Moves extremities.  Bulky dressing over left foot.  No erythema or swelling proximally. SKIN: Dressing over left foot.  No lesion elsewhere. NEURO: Awake and alert.  Oriented to self, place and months.  No apparent focal neuro deficit. PSYCH: Calm. Normal affect.   Procedures:  08/04/2020-incision and drainage of left foot infection.  Microbiology summarized: COVID-19 and influenza PCR nonreactive. MRSA PCR negative. Blood and urine cultures NGTD Abscess culture pending.  Assessment & Plan: Acute respiratory failure with hypoxia: Multifactorial including CAP, severe sepsis and acute CHF. Patient is not on oxygen at baseline.  Initial CT chest and CXR without significant finding.  D-dimer elevated but VQ scan and LE [place] negative.  BNP 500.  Repeat CXR with bibasilar and right lung opacity. -Continue cefepime, Flagyl  and azithromycin -Added linezolid on 4/30 for left foot infection. -Redose IV Lasix in the afternoon -Wean oxygen as able. -Incentive spirometry/OOB/PT/OT -Appreciate help by PCCM.  Severe sepsis due to  left foot cellulitis with abscess and possible community-acquired pneumonia POA: Has leukocytosis, tachycardia, tachypnea, AKI, respiratory failure and hypotension.  MRI left foot raised concern for necrotizing fasciitis although her had leukocytosis for a week and elevated ESR argues against  necrotizing fasciitis. -Status post I&D of left foot by podiatry, Dr. Posey Pronto on 4/30 -Nonweightbearing on left foot. -Currently on IV cefepime, Flagyl and linezolid right -Patient to return to the OR on 08/06/2020 -Infectious disease following.   Acute diastolic CHF: TTE with LVEF of 60 to 65%, G2-DD.  BNP elevated to 500.  CXR with bibasilar and right lung opacities.  Started on IV Lasix yesterday, and had additional dose earlier this morning.  UOP about 1.8 L.  Creatinine improved but increased oxygen requirement overnight.  -IV Lasix 40 mg twice daily starting this afternoon -Monitor fluid status, renal functions and electrolytes   AKI on CKD-3B/azotemia: Baseline Cr 1.1-1.2.  AKI likely in the setting of possible sepsis and nephrotoxic meds (NSAIDs and ACE inhibitor's). FENa 1.6% suggesting intrinsic process.  UPC 0.49.  Renal ultrasound without significant finding.  Improving Recent Labs    02/02/20 0811 06/21/20 0835 07/29/20 2255 08/01/20 0041 08/02/20 0559 08/03/20 0411 08/04/20 0335 08/05/20 0231  BUN 23 20 25* 46* 46* 56* 64* 56*  CREATININE 1.34* 1.19 1.54* 2.13* 2.27* 2.15* 1.90* 1.66*  -Recheck renal function in the morning -Continue holding ACE inhibitor's.  Acute metabolic encephalopathy: Could be from sepsis or iatrogenic from pain meds.  Improved.  She is more alert today.  Oriented to self, place and month.  ABG reassuring. -Treat treatable causes as above -Discontinued p.o. oxycodone -Reorientation and delirium precautions -RN to perform bedside swallow eval.  SLP eval ordered as well.  Possible PAD: I was not able to palpate DP pulse -ABI  Elevated troponin: Likely demand ischemia in the setting of the above.  Hypernatremia: Na 147> 142.  Resolved. -Recheck in the morning  Uncontrolled IDDM-2 with hyperglycemia, CKD and peripheral neuropathy: A1c 8.0% on 06/21/2020.  On Humalog 75/25 at 30 units twice daily, metformin Recent Labs  Lab 08/04/20 1558  08/04/20 1800 08/04/20 1803 08/04/20 2139 08/05/20 0802  GLUCAP 147* 298* 142* 144* 187*  -Continue SSI-moderate -Continue atorvastatin, Zetia and gabapentin.  Proteinuria: UA with > 300 proteins.  UPC 0.5 arguing against nephrotic syndrome. -Resume ACE inhibitor's once renal function improves  Leukocytosis/bandemia: WBC 25>>18>25. -Antibiotics as above -Continue monitoring  Hyperbilirubinemia: Resolved. -Recheck in the morning  Hypotension: Likely due to sepsis.  Hypertension: BP elevated this morning.  She did not get her Coreg last night. -Continue home Coreg -Resume home amlodipine -As needed labetalol -Continue holding lisinopril  Normocytic anemia: Drop in Hgb likely dilutional.  She denies melena or hematochezia. Recent Labs    02/02/20 0811 06/21/20 0835 07/29/20 2255 08/02/20 0041 08/02/20 0559 08/03/20 0411 08/04/20 0335 08/04/20 2007 08/05/20 0231  HGB 11.8* 11.5* 10.2* 8.2* 8.4* 7.7* 7.9* 9.1* 9.5*  -Transfused 1 unit of blood with appropriate response.  -Continue monitoring -Monitor daily  Tobacco use disorder: Smokes about half a pack a day. -Encourage tobacco cessation -She is not interested in nicotine patch.  Chronic back pain-recent MRI of T and L-spine with moderate sized rightward disc extrusion at L4-5 with severe lateral recess stenosis and moderate multifactorial bilateral L4 neuroforaminal stenosis.  No red flags -  Scheduled Tylenol with as needed tramadol -Outpatient follow-up -PT/OT eval   Hyperuricemia: Due to renal failure?  Gout? -Started colchicine  Morbid obesity Body mass index is 42.41 kg/m.         DVT prophylaxis:  On subcu heparin  Code Status: Full code Family Communication: Updated Mr. Ronnald Ramp, patient's son-in-law with patient's permission Level of care: Stepdown Status is: Inpatient  Remains inpatient appropriate because:Persistent severe electrolyte disturbances, Altered mental status, Ongoing diagnostic  testing needed not appropriate for outpatient work up, IV treatments appropriate due to intensity of illness or inability to take PO and Inpatient level of care appropriate due to severity of illness   Dispo: The patient is from: Home              Anticipated d/c is to: Home              Patient currently is not medically stable to d/c.   Difficult to place patient No       Consultants:  PCCM Infectious disease Podiatery   Sch Meds:  Scheduled Meds: . sodium chloride   Intravenous Once  . amLODipine  10 mg Oral Daily  . atorvastatin  40 mg Oral Daily  . azithromycin  500 mg Oral Daily  . carvedilol  25 mg Oral BID WC  . Chlorhexidine Gluconate Cloth  6 each Topical Daily  . colchicine  0.6 mg Oral Daily  . ezetimibe  10 mg Oral Daily  . furosemide  40 mg Intravenous BID  . gabapentin  300 mg Oral BID  . heparin  5,000 Units Subcutaneous Q8H  . insulin aspart  0-15 Units Subcutaneous TID WC  . insulin aspart  0-5 Units Subcutaneous QHS  . mouth rinse  15 mL Mouth Rinse BID   Continuous Infusions: . ceFEPime (MAXIPIME) IV Stopped (08/05/20 0245)  . linezolid (ZYVOX) IV 600 mg (08/05/20 0915)  . metronidazole Stopped (08/05/20 0500)   PRN Meds:.acetaminophen **OR** acetaminophen, labetalol, traMADol  Antimicrobials: Anti-infectives (From admission, onward)   Start     Dose/Rate Route Frequency Ordered Stop   08/04/20 1430  DAPTOmycin (CUBICIN) 600 mg in sodium chloride 0.9 % IVPB  Status:  Discontinued        8 mg/kg  75.2 kg (Adjusted) 124 mL/hr over 30 Minutes Intravenous Daily 08/04/20 1317 08/04/20 1324   08/04/20 1430  linezolid (ZYVOX) IVPB 600 mg        600 mg 300 mL/hr over 60 Minutes Intravenous Every 12 hours 08/04/20 1331     08/04/20 0300  vancomycin (VANCOREADY) IVPB 1250 mg/250 mL  Status:  Discontinued        1,250 mg 166.7 mL/hr over 90 Minutes Intravenous Every 48 hours 08/02/20 0508 08/02/20 1115   08/03/20 1300  azithromycin (ZITHROMAX) tablet  500 mg        500 mg Oral Daily 08/03/20 1203 08/08/20 0959   08/02/20 1900  metroNIDAZOLE (FLAGYL) IVPB 500 mg        500 mg 100 mL/hr over 60 Minutes Intravenous Every 8 hours 08/02/20 1059     08/02/20 1400  ceFEPIme (MAXIPIME) 2 g in sodium chloride 0.9 % 100 mL IVPB        2 g 200 mL/hr over 30 Minutes Intravenous Every 12 hours 08/02/20 0502     08/02/20 1100  metroNIDAZOLE (FLAGYL) IVPB 500 mg  Status:  Discontinued        500 mg 100 mL/hr over 60 Minutes Intravenous Every 8 hours 08/02/20 0500 08/02/20  1100   08/02/20 0515  ceFEPIme (MAXIPIME) 2 g in sodium chloride 0.9 % 100 mL IVPB  Status:  Discontinued        2 g 200 mL/hr over 30 Minutes Intravenous  Once 08/02/20 0500 08/02/20 0502   08/02/20 0515  vancomycin (VANCOCIN) IVPB 1000 mg/200 mL premix  Status:  Discontinued        1,000 mg 200 mL/hr over 60 Minutes Intravenous  Once 08/02/20 0500 08/02/20 0503   08/02/20 0230  vancomycin (VANCOREADY) IVPB 2000 mg/400 mL        2,000 mg 200 mL/hr over 120 Minutes Intravenous STAT 08/02/20 0218 08/02/20 0441   08/02/20 0215  ceFEPIme (MAXIPIME) 2 g in sodium chloride 0.9 % 100 mL IVPB        2 g 200 mL/hr over 30 Minutes Intravenous  Once 08/02/20 0208 08/02/20 0250   08/02/20 0215  metroNIDAZOLE (FLAGYL) IVPB 500 mg        500 mg 100 mL/hr over 60 Minutes Intravenous  Once 08/02/20 0208 08/02/20 0343   08/02/20 0215  vancomycin (VANCOCIN) IVPB 1000 mg/200 mL premix  Status:  Discontinued        1,000 mg 200 mL/hr over 60 Minutes Intravenous  Once 08/02/20 0208 08/02/20 0218       I have personally reviewed the following labs and images: CBC: Recent Labs  Lab 07/29/20 2255 08/02/20 0041 08/02/20 0559 08/03/20 0411 08/04/20 0335 08/04/20 2007 08/05/20 0231  WBC 24.8* 24.9* 25.1* 20.7* 18.8*  --  25.8*  NEUTROABS 21.3* 21.7* 21.6*  --  16.1*  --  21.7*  HGB 10.2* 8.2* 8.4* 7.7* 7.9* 9.1* 9.5*  HCT 32.7* 26.6* 27.7* 25.7* 25.4* 28.9* 29.8*  MCV 94.8 96.0 97.2 97.0  96.9  --  95.2  PLT 351 311 289 280 302  --  360   BMP &GFR Recent Labs  Lab 08/01/20 0041 08/02/20 0559 08/03/20 0411 08/04/20 0335 08/05/20 0231  NA 142 147* 142 145 142  K 3.9 4.2 4.0 3.9 3.5  CL 109 114* 112* 115* 113*  CO2 21* 21* 21* 20* 20*  GLUCOSE 230* 223* 155* 126* 187*  BUN 46* 46* 56* 64* 56*  CREATININE 2.13* 2.27* 2.15* 1.90* 1.66*  CALCIUM 8.6* 8.6* 8.5* 8.8* 8.7*  MG  --   --  2.5* 2.3 2.3  PHOS  --   --  4.7* 4.2 3.5   Estimated Creatinine Clearance: 41 mL/min (A) (by C-G formula based on SCr of 1.66 mg/dL (H)). Liver & Pancreas: Recent Labs  Lab 07/30/20 0255 08/02/20 0559 08/03/20 0411 08/04/20 0335 08/05/20 0231  AST 17 17  --  16 14*  ALT 14 19  --  17 16  ALKPHOS 101 135*  --  131* 128*  BILITOT 0.8 1.4*  --  1.0 0.9  PROT 6.2* 6.1*  --  6.0* 6.3*  ALBUMIN 2.8* 2.7* 2.5* 2.4*  2.4* 2.4*   No results for input(s): LIPASE, AMYLASE in the last 168 hours. No results for input(s): AMMONIA in the last 168 hours. Diabetic: No results for input(s): HGBA1C in the last 72 hours. Recent Labs  Lab 08/04/20 1558 08/04/20 1800 08/04/20 1803 08/04/20 2139 08/05/20 0802  GLUCAP 147* 298* 142* 144* 187*   Cardiac Enzymes: Recent Labs  Lab 08/04/20 0335  CKTOTAL 46   No results for input(s): PROBNP in the last 8760 hours. Coagulation Profile: Recent Labs  Lab 08/02/20 0041 08/02/20 0559  INR 1.1 1.1   Thyroid Function Tests:  No results for input(s): TSH, T4TOTAL, FREET4, T3FREE, THYROIDAB in the last 72 hours. Lipid Profile: No results for input(s): CHOL, HDL, LDLCALC, TRIG, CHOLHDL, LDLDIRECT in the last 72 hours. Anemia Panel: No results for input(s): VITAMINB12, FOLATE, FERRITIN, TIBC, IRON, RETICCTPCT in the last 72 hours. Urine analysis:    Component Value Date/Time   COLORURINE AMBER (A) 08/02/2020 0540   APPEARANCEUR CLEAR 08/02/2020 0540   LABSPEC 1.020 08/02/2020 0540   PHURINE 5.0 08/02/2020 0540   GLUCOSEU 50 (A)  08/02/2020 0540   HGBUR NEGATIVE 08/02/2020 0540   BILIRUBINUR NEGATIVE 08/02/2020 0540   KETONESUR NEGATIVE 08/02/2020 0540   PROTEINUR >=300 (A) 08/02/2020 0540   UROBILINOGEN 0.2 11/29/2014 1231   NITRITE POSITIVE (A) 08/02/2020 0540   LEUKOCYTESUR NEGATIVE 08/02/2020 0540   Sepsis Labs: Invalid input(s): PROCALCITONIN, [place]  Microbiology: Recent Results (from the past 240 hour(s))  Culture, blood (routine x 2)     Status: None   Collection Time: 07/30/20  2:55 AM   Specimen: BLOOD LEFT ARM  Result Value Ref Range Status   Specimen Description BLOOD LEFT ARM  Final   Special Requests   Final    BOTTLES DRAWN AEROBIC AND ANAEROBIC Blood Culture adequate volume   Culture   Final    NO GROWTH 5 DAYS Performed at [facility], [address], [place], [place] [address]    Report Status 08/04/2020 FINAL  Final  Culture, blood (routine x 2)     Status: None   Collection Time: 07/30/20  2:58 AM   Specimen: BLOOD LEFT HAND  Result Value Ref Range Status   Specimen Description BLOOD LEFT HAND  Final   Special Requests   Final    BOTTLES DRAWN AEROBIC AND ANAEROBIC Blood Culture adequate volume   Culture   Final    NO GROWTH 5 DAYS Performed at [facility], [place] [address] [place], [name] [address]    Report Status 08/04/2020 FINAL  Final  Resp Panel by RT-PCR (Flu A&B, Covid) Nasopharyngeal Swab     Status: None   Collection Time: 07/30/20  3:05 AM   Specimen: Nasopharyngeal Swab; Nasopharyngeal(NP) swabs in vial transport medium  Result Value Ref Range Status   SARS Coronavirus 2 by RT PCR NEGATIVE NEGATIVE Final    Comment: (NOTE) SARS-CoV-2 target nucleic acids are NOT DETECTED.  The SARS-CoV-2 RNA is generally detectable in upper respiratory specimens during the acute phase of infection. The lowest concentration of SARS-CoV-2 viral copies this assay can detect is 138 copies/mL. A negative result does not preclude SARS-Cov-2 infection and should not  be used as the sole basis for treatment or other patient management decisions. A negative result may occur with  improper specimen collection/handling, submission of specimen other than nasopharyngeal swab, presence of viral mutation(s) within the areas targeted by this assay, and inadequate number of viral copies(<138 copies/mL). A negative result must be combined with clinical observations, patient history, and epidemiological information. The expected result is Negative.  Fact Sheet for Patients:  EntrepreneurPulse.com.au  Fact Sheet for Healthcare Providers:  IncredibleEmployment.be  This test is no t yet approved or cleared by the [place] FDA and  has been authorized for detection and/or diagnosis of SARS-CoV-2 by FDA under an Emergency Use Authorization (EUA). This EUA will remain  in effect (meaning this test can be used) for the duration of the COVID-19 declaration under Section 564(b)(1) of the Act, 21 U.S.C.section 360bbb-3(b)(1), unless the authorization is terminated  or revoked  sooner.       Influenza A by PCR NEGATIVE NEGATIVE Final   Influenza B by PCR NEGATIVE NEGATIVE Final    Comment: (NOTE) The Xpert Xpress SARS-CoV-2/FLU/RSV plus assay is intended as an aid in the diagnosis of influenza from Nasopharyngeal swab specimens and should not be used as a sole basis for treatment. Nasal washings and aspirates are unacceptable for Xpert Xpress SARS-CoV-2/FLU/RSV testing.  Fact Sheet for Patients: EntrepreneurPulse.com.au  Fact Sheet for Healthcare Providers: IncredibleEmployment.be  This test is not yet approved or cleared by the [place] FDA and has been authorized for detection and/or diagnosis of SARS-CoV-2 by FDA under an Emergency Use Authorization (EUA). This EUA will remain in effect (meaning this test can be used) for the duration of the COVID-19 declaration under Section  564(b)(1) of the Act, 21 U.S.C. section 360bbb-3(b)(1), unless the authorization is terminated or revoked.  Performed at [facility], [place] [address], [place], [place] [address]   Urine culture     Status: Abnormal   Collection Time: 07/30/20 11:42 AM   Specimen: Urine, Clean Catch  Result Value Ref Range Status   Specimen Description URINE, CLEAN CATCH  Final   Special Requests NONE  Final   Culture (A)  Final    <10,000 COLONIES/mL INSIGNIFICANT GROWTH Performed at [facility], [place] [address], [place], [place] [address]    Report Status 07/31/2020 FINAL  Final  Blood Culture (routine x 2)     Status: None (Preliminary result)   Collection Time: 08/02/20  1:00 AM   Specimen: BLOOD  Result Value Ref Range Status   Specimen Description   Final    BLOOD BLOOD LEFT FOREARM Performed at [facility] [address] [place], [place] [address]    Special Requests   Final    BOTTLES DRAWN AEROBIC AND ANAEROBIC Blood Culture results may not be optimal due to an inadequate volume of blood received in culture bottles Performed at [facility] [address], [place], [place] [address]    Culture   Final    NO GROWTH 2 DAYS Performed at [facility] [address] [place], [place] [address]    Report Status PENDING  Incomplete  Blood Culture (routine x 2)     Status: None (Preliminary result)   Collection Time: 08/02/20  1:00 AM   Specimen: BLOOD  Result Value Ref Range Status   Specimen Description   Final    BLOOD BLOOD RIGHT FOREARM Performed at [place] [address], [place], [place] [address]    Special Requests   Final    BOTTLES DRAWN AEROBIC AND ANAEROBIC Blood Culture adequate volume Performed at [facility] [address], [place], [place] [address]    Culture   Final    NO GROWTH 2 DAYS Performed at [facility] [address], [place], [place]  [address]    Report Status PENDING  Incomplete  Resp Panel by RT-PCR (Flu A&B, Covid) Nasopharyngeal Swab     Status: None   Collection Time: 08/02/20  2:32 AM   Specimen: Nasopharyngeal Swab; Nasopharyngeal(NP) swabs in vial transport medium  Result Value Ref Range Status   SARS Coronavirus 2 by RT PCR NEGATIVE NEGATIVE Final    Comment: (NOTE) SARS-CoV-2 target nucleic acids are NOT DETECTED.  The SARS-CoV-2 RNA is generally detectable in upper respiratory specimens during the acute phase of infection. The lowest concentration of  SARS-CoV-2 viral copies this assay can detect is 138 copies/mL. A negative result does not preclude SARS-Cov-2 infection and should not be used as the sole basis for treatment or other patient management decisions. A negative result may occur with  improper specimen collection/handling, submission of specimen other than nasopharyngeal swab, presence of viral mutation(s) within the areas targeted by this assay, and inadequate number of viral copies(<138 copies/mL). A negative result must be combined with clinical observations, patient history, and epidemiological information. The expected result is Negative.  Fact Sheet for Patients:  EntrepreneurPulse.com.au  Fact Sheet for Healthcare Providers:  IncredibleEmployment.be  This test is no t yet approved or cleared by the [place] FDA and  has been authorized for detection and/or diagnosis of SARS-CoV-2 by FDA under an Emergency Use Authorization (EUA). This EUA will remain  in effect (meaning this test can be used) for the duration of the COVID-19 declaration under Section 564(b)(1) of the Act, 21 U.S.C.section 360bbb-3(b)(1), unless the authorization is terminated  or revoked sooner.       Influenza A by PCR NEGATIVE NEGATIVE Final   Influenza B by PCR NEGATIVE NEGATIVE Final    Comment: (NOTE) The Xpert Xpress SARS-CoV-2/FLU/RSV plus assay is intended as an  aid in the diagnosis of influenza from Nasopharyngeal swab specimens and should not be used as a sole basis for treatment. Nasal washings and aspirates are unacceptable for Xpert Xpress SARS-CoV-2/FLU/RSV testing.  Fact Sheet for Patients: EntrepreneurPulse.com.au  Fact Sheet for Healthcare Providers: IncredibleEmployment.be  This test is not yet approved or cleared by the [place] FDA and has been authorized for detection and/or diagnosis of SARS-CoV-2 by FDA under an Emergency Use Authorization (EUA). This EUA will remain in effect (meaning this test can be used) for the duration of the COVID-19 declaration under Section 564(b)(1) of the Act, 21 U.S.C. section 360bbb-3(b)(1), unless the authorization is terminated or revoked.  Performed at [facility], [place] [address] [place], [place] [address]   Urine culture     Status: None   Collection Time: 08/02/20  5:40 AM   Specimen: In/Out Cath Urine  Result Value Ref Range Status   Specimen Description   Final    IN/OUT CATH URINE Performed at [facility] [address], [place], [place] [address]    Special Requests   Final    NONE Performed at [facility], [place] [address], [place], [place] [address]    Culture   Final    NO GROWTH Performed at [facility], [place] [address] [place], [place] [address]    Report Status 08/03/2020 FINAL  Final  MRSA PCR Screening     Status: None   Collection Time: 08/02/20  9:05 AM   Specimen: Nasopharyngeal  Result Value Ref Range Status   MRSA by PCR NEGATIVE NEGATIVE Final    Comment:        The GeneXpert MRSA Assay (FDA approved for NASAL specimens only), is one component of a comprehensive MRSA colonization surveillance program. It is not intended to diagnose MRSA infection nor to guide or monitor treatment for MRSA infections. Performed at [facility],  [place] [address], [place],  [address]   Aerobic/Anaerobic Culture w Gram Stain (surgical/deep wound)     Status: None (Preliminary result)   Collection Time: 08/04/20  5:17 PM   Specimen: Wound  Result Value Ref Range Status   Specimen Description ABSCESS LEFT FOOT  Final   Special  Requests   Final    NONE Performed at [facility], [place] [address] [place], [place] [address]    Gram Stain PENDING  Incomplete   Culture PENDING  Incomplete   Report Status PENDING  Incomplete    Radiology Studies: MR FOOT LEFT WO CONTRAST  Result Date: 08/04/2020 CLINICAL DATA:  Left foot swelling. EXAM: MRI OF THE LEFT FOOT WITHOUT CONTRAST TECHNIQUE: Multiplanar, multisequence MR imaging of the left foot was performed. No intravenous contrast was administered. COMPARISON:  None. FINDINGS: Large field of view limits evaluation. Bones/Joint/Cartilage No fracture or dislocation. Normal alignment. No joint effusion. No periosteal reaction or bone destruction. Minimal marrow edema at the base of the third proximal phalanx. Ligaments Collateral ligaments are intact.  Lisfranc ligament is intact. Muscles and Tendons Flexor, peroneal and extensor compartment tendons are intact. Generalized T2 hyperintense signal throughout the plantar musculature which may be neurogenic versus secondary to myositis. Soft tissue Severe soft tissue edema throughout the left foot. Low signal foci in the dorsal aspect of forefoot consistent with air which tracks proximally to the level of the TMT joints. Small amount air seen along the plantar aspect of the foot along the second MTP joint. No drainable fluid collection to suggest an abscess. IMPRESSION: 1. Severe soft edema throughout the foot with air in the dorsal aspect of the forefoot tracking proximally to the level of the TMT joints most concerning for necrotizing infection. 2. Minimal marrow edema at the base of the third proximal phalanx without cortical destruction may  reflect reactive marrow edema versus early osteomyelitis. Electronically Signed   By: Kathreen Devoid   On: 08/04/2020 11:55   DG CHEST PORT 1 VIEW  Result Date: 08/05/2020 CLINICAL DATA:  Shortness of breath EXAM: PORTABLE CHEST 1 VIEW COMPARISON:  08/03/2020 radiograph and CT FINDINGS: Increasing hazy opacities are present throughout the right hemithorax and to a lesser extent the left lower lung. Could reflect worsening atelectasis or developing edema. Calcified granulomata again seen in the left lung. Few calcified lymph nodes are noted as well. Stable cardiomediastinal contours. No acute osseous or soft tissue abnormality. Telemetry leads overlie the chest. IMPRESSION: Some increasing hazy opacities particularly through the right hemithorax and bilateral lung bases. Could reflect worsening atelectasis, developing airspace disease or atypical infection. Sequela of prior granulomatous disease. Electronically Signed   By: Lovena Le M.D.   On: 08/05/2020 04:25   ECHOCARDIOGRAM COMPLETE  Result Date: 08/04/2020    ECHOCARDIOGRAM REPORT   Patient Name:   [facility] Date of Exam: 08/04/2020 Medical Rec #:  [id number]      Height:       63.0 in Accession #:    [id number]     Weight:       241.2 lb Date of Birth:  1955/07/21      BSA:          2.094 m Patient Age:    72 years       BP:           130/56 mmHg Patient Gender: F              HR:           63 bpm. Exam Location:  Inpatient Procedure: 2D Echo, Cardiac Doppler and Color Doppler Indications:    Dyspnea 06.00  History:        Patient has no prior history of Echocardiogram examinations.  Risk Factors:Hypertension, Diabetes, Dyslipidemia and Current                 Smoker.  Sonographer:    Tiffany Dance Referring Phys: [id number] Charlesetta Ivory Elysabeth Aust IMPRESSIONS  1. Left ventricular ejection fraction, by estimation, is 60 to 65%. The left ventricle has normal function. The left ventricle has no regional wall motion abnormalities. Left ventricular  diastolic parameters are consistent with Grade II diastolic dysfunction (pseudonormalization).  2. Right ventricular systolic function is normal. The right ventricular size is normal.  3. Left atrial size was mildly dilated.  4. The mitral valve is normal in structure. Trivial mitral valve regurgitation. No evidence of mitral stenosis.  5. The aortic valve is normal in structure. Aortic valve regurgitation is not visualized. No aortic stenosis is present.  6. The inferior vena cava is dilated in size with >50% respiratory variability, suggesting right atrial pressure of 8 mmHg. FINDINGS  Left Ventricle: Left ventricular ejection fraction, by estimation, is 60 to 65%. The left ventricle has normal function. The left ventricle has no regional wall motion abnormalities. The left ventricular internal cavity size was normal in size. There is  no left ventricular hypertrophy. Left ventricular diastolic parameters are consistent with Grade II diastolic dysfunction (pseudonormalization). Right Ventricle: The right ventricular size is normal. No increase in right ventricular wall thickness. Right ventricular systolic function is normal. Left Atrium: Left atrial size was mildly dilated. Right Atrium: Right atrial size was normal in size. Pericardium: There is no evidence of pericardial effusion. Mitral Valve: The mitral valve is normal in structure. Trivial mitral valve regurgitation. No evidence of mitral valve stenosis. Tricuspid Valve: The tricuspid valve is normal in structure. Tricuspid valve regurgitation is not demonstrated. No evidence of tricuspid stenosis. Aortic Valve: The aortic valve is normal in structure. Aortic valve regurgitation is not visualized. No aortic stenosis is present. Pulmonic Valve: The pulmonic valve was normal in structure. Pulmonic valve regurgitation is not visualized. No evidence of pulmonic stenosis. Aorta: The aortic root is normal in size and structure. Venous: The inferior vena cava is  dilated in size with greater than 50% respiratory variability, suggesting right atrial pressure of 8 mmHg. IAS/Shunts: No atrial level shunt detected by color flow Doppler.  LEFT VENTRICLE PLAX 2D LVIDd:         5.30 cm  Diastology LVIDs:         3.50 cm  LV e' medial:    7.07 cm/s LV PW:         1.10 cm  LV E/e' medial:  18.4 LV IVS:        0.80 cm  LV e' lateral:   10.10 cm/s LVOT diam:     1.90 cm  LV E/e' lateral: 12.9 LV SV:         85 LV SV Index:   41 LVOT Area:     2.84 cm  RIGHT VENTRICLE             IVC RV Basal diam:  2.70 cm     IVC diam: 2.80 cm RV S prime:     13.60 cm/s TAPSE (M-mode): 2.5 cm LEFT ATRIUM              Index       RIGHT ATRIUM           Index LA diam:        3.90 cm  1.86 cm/m  RA Area:     16.10 cm LA Vol (  A2C):   111.0 ml 53.01 ml/m RA Volume:   42.10 ml  20.11 ml/m LA Vol (A4C):   41.9 ml  20.01 ml/m LA Biplane Vol: 73.7 ml  35.20 ml/m  AORTIC VALVE LVOT Vmax:   140.00 cm/s LVOT Vmean:  82.100 cm/s LVOT VTI:    0.300 m  AORTA Ao Root diam: 2.90 cm Ao Asc diam:  3.10 cm MITRAL VALVE MV Area (PHT): 3.21 cm     SHUNTS MV Decel Time: 236 msec     Systemic VTI:  0.30 m MV E velocity: 130.00 cm/s  Systemic Diam: 1.90 cm MV A velocity: 86.60 cm/s MV E/A ratio:  1.50 Candee Furbish MD Electronically signed by Candee Furbish MD Signature Date/Time: 08/04/2020/1:50:45 PM    Final      Adleigh Mcmasters T. [place]  If 7PM-7AM, please contact night-coverage [email or web address] 08/05/2020, 10:35 AM

## 2020-08-05 NOTE — Progress Notes (Signed)
eLink Physician-Brief Progress Note Patient Name: Shirley Chen DOB: 1955/09/26 MRN: [id number]   Date of Service  08/05/2020  HPI/Events of Note  Increased O2 requirement - CXR reveals increasing hazy opacities are present throughout the right hemithorax and to a lesser extent the left lower lung. Could reflect worsening atelectasis or developing edema. K+ = 3.5 and Creatinine = 1.66. BP = 146/54 with MAP = 67. I/O positive 2957 mL since admission. BP good, will empirically diurese.  eICU Interventions  Plan: 1. Lasix 40 mg IV X 1 now. 2. Replace K+     Intervention Category Major Interventions: Electrolyte abnormality - evaluation and management;Other:  Lenell Antu 08/05/2020, 4:35 AM

## 2020-08-05 NOTE — Progress Notes (Signed)
Name: Shirley Chen MRN: [id number] DOB: 12/08/1955    ADMISSION DATE:  08/01/2020 CONSULTATION DATE:  08/05/2020  REFERRING MD :  Idalia Needle  CHIEF COMPLAINT: Left foot pain and swelling, respiratory distress, hypoxia  BRIEF PATIENT DESCRIPTION: 65 year old smoker, diabetic admitted with shortness of breath, found to have necrotizing fasciitis of left foot.  Increasing hypoxia requiring 6 L, hence PCCM consulted  SIGNIFICANT EVENTS  4/30 rapid response called for mental status changes 4/30 debridement of left foot under general anesthesia >> purulent drainage across midfoot, left open  STUDIES:  CT chest without contrast 4/28 no infiltrate VQ scan 4/28 no perfusion defects B Lower extremity duplex negative Echo 4/30 normal LVEF grade 2 diastolic dysfunction   HISTORY OF PRESENT ILLNESS: She was admitted 4/28 with initial complaints of shortness of breath, found to be hypoxic requiring 3 L, Rapidly progressing to 6 L nasal cannula. She was mildly hypotensive and received fluids.  Initial labs significant for WBC of 24K and AKI with creatinine of 2.1.  VQ scan did not show any perfusion defects .  BNP was 383 and troponin was mildly elevated. She was treated with cefepime and Flagyl empirically for severe sepsis, then noted to have painful left foot .  Rapid response was called due to mental status changes overnight on 4/30, head CT negative.  MRI showed air in the dorsal aspect of the forefoot tracking proximally, there were small amount of air along the plantar aspect of the foot.  Concern was raised for necrotizing infection and early osteomyelitis of the third proximal phalanx.  Podiatry was consulted and plan is for OR 5/1.  Linezolid has been added to cefepime and Flagyl. PCCM is consulted for hypoxia and perioperative assistance   SUBJECTIVE: Complains of pain in her foot. Breathing okay Sitting up in bed with nonrebreather on her chest   VITAL SIGNS: Temp:  [98.5 F (36.9  C)-100.8 F (38.2 C)] 98.5 F (36.9 C) (05/01 1200) Pulse Rate:  [68-81] 72 (05/01 0800) Resp:  [14-21] 19 (05/01 0800) BP: (119-193)/(47-117) 189/60 (05/01 1125) SpO2:  [91 %-100 %] 100 % (05/01 0800) Weight:  [108.6 kg] 108.6 kg (05/01 0518)  PHYSICAL EXAMINATION: General: Chronically ill-appearing, mild distress due to pain Neuro: Awake, interactive, follows commands HEENT: No JVD, mild pallor, icterus Cardiovascular: S1-S2 tacky, no murmur Lungs: No accessory muscle use, decreased breath sounds bilateral Abdomen: Soft, nontender abdomen Musculoskeletal: Left foot wrapped, 1+ edema up to knee, tender to touch on left as well as right leg Skin: As above, no generalized rash  Recent Labs  Lab 08/03/20 0411 08/04/20 0335 08/05/20 0231  NA 142 145 142  K 4.0 3.9 3.5  CL 112* 115* 113*  CO2 21* 20* 20*  BUN 56* 64* 56*  CREATININE 2.15* 1.90* 1.66*  GLUCOSE 155* 126* 187*   Recent Labs  Lab 08/03/20 0411 08/04/20 0335 08/04/20 2007 08/05/20 0231  HGB 7.7* 7.9* 9.1* 9.5*  HCT 25.7* 25.4* 28.9* 29.8*  WBC 20.7* 18.8*  --  25.8*  PLT 280 302  --  360   CT HEAD WO CONTRAST  Result Date: 08/04/2020 CLINICAL DATA:  65 year old female with unexplained altered mental status. EXAM: CT HEAD WITHOUT CONTRAST TECHNIQUE: Contiguous axial images were obtained from the base of the skull through the vertex without intravenous contrast. COMPARISON:  None. FINDINGS: Brain: Cerebral volume is within normal limits for age. No midline shift, ventriculomegaly, mass effect, evidence of mass lesion, intracranial hemorrhage or evidence of cortically based acute infarction. Small  hypodensity at the lateral left thalamus near the posterior limb left internal capsule on series 10, image 32. Elsewhere gray-white matter differentiation is symmetric and within normal limits for age. No cortical encephalomalacia identified. Vascular: Calcified atherosclerosis at the skull base. No suspicious intracranial  vascular hyperdensity. Skull: No acute osseous abnormality identified. Sinuses/Orbits: Visualized paranasal sinuses and mastoids are clear. Other: No acute orbit or scalp soft tissue finding. IMPRESSION: 1. Suspected small age indeterminate lacunar infarct in the lateral left thalamus, less likely a perivascular space (normal variant). Query right sided symptoms. 2. Otherwise normal for age non contrast CT appearance of the brain. Electronically Signed   By: Odessa FlemingH  Hall M.D.   On: 08/04/2020 06:04   US RENAL  Result Date: 08/03/2020 CLINICAL DATA:  Acute kidney injury. EXAM: RENAL / URINARY TRACT ULTRASOUND COMPLETE COMPARISON:  November 29, 2014. FINDINGS: Right Kidney: Renal measurements: 12.1 x 6.0 x 6.0 cm = volume: 225 mL. 2.7 cm simple cyst is noted. Echogenicity within normal limits. No mass or hydronephrosis visualized. Left Kidney: Renal measurements: 11.5 x 6.0 x 5.3 cm = volume: 191 mL. Echogenicity within normal limits. No mass or hydronephrosis visualized. Bladder: Appears normal for degree of bladder distention. Other: None. IMPRESSION: No significant renal abnormality is noted. Electronically Signed   By: Lupita RaiderJames  Green Jr M.D.   On: 08/03/2020 16:24   MR FOOT LEFT WO CONTRAST  Result Date: 08/04/2020 CLINICAL DATA:  Left foot swelling. EXAM: MRI OF THE LEFT FOOT WITHOUT CONTRAST TECHNIQUE: Multiplanar, multisequence MR imaging of the left foot was performed. No intravenous contrast was administered. COMPARISON:  None. FINDINGS: Large field of view limits evaluation. Bones/Joint/Cartilage No fracture or dislocation. Normal alignment. No joint effusion. No periosteal reaction or bone destruction. Minimal marrow edema at the base of the third proximal phalanx. Ligaments Collateral ligaments are intact.  Lisfranc ligament is intact. Muscles and Tendons Flexor, peroneal and extensor compartment tendons are intact. Generalized T2 hyperintense signal throughout the plantar musculature which may be  neurogenic versus secondary to myositis. Soft tissue Severe soft tissue edema throughout the left foot. Low signal foci in the dorsal aspect of forefoot consistent with air which tracks proximally to the level of the TMT joints. Small amount air seen along the plantar aspect of the foot along the second MTP joint. No drainable fluid collection to suggest an abscess. IMPRESSION: 1. Severe soft edema throughout the foot with air in the dorsal aspect of the forefoot tracking proximally to the level of the TMT joints most concerning for necrotizing infection. 2. Minimal marrow edema at the base of the third proximal phalanx without cortical destruction may reflect reactive marrow edema versus early osteomyelitis. Electronically Signed   By: Elige KoHetal  Patel   On: 08/04/2020 11:55   DG CHEST PORT 1 VIEW  Result Date: 08/05/2020 CLINICAL DATA:  Shortness of breath EXAM: PORTABLE CHEST 1 VIEW COMPARISON:  08/03/2020 radiograph and CT FINDINGS: Increasing hazy opacities are present throughout the right hemithorax and to a lesser extent the left lower lung. Could reflect worsening atelectasis or developing edema. Calcified granulomata again seen in the left lung. Few calcified lymph nodes are noted as well. Stable cardiomediastinal contours. No acute osseous or soft tissue abnormality. Telemetry leads overlie the chest. IMPRESSION: Some increasing hazy opacities particularly through the right hemithorax and bilateral lung bases. Could reflect worsening atelectasis, developing airspace disease or atypical infection. Sequela of prior granulomatous disease. Electronically Signed   By: Kreg ShropshirePrice  DeHay M.D.   On: 08/05/2020 04:25  ECHOCARDIOGRAM COMPLETE  Result Date: 08/04/2020    ECHOCARDIOGRAM REPORT   Patient Name:   [facility] Date of Exam: 08/04/2020 Medical Rec #:  [id number]      Height:       63.0 in Accession #:    [id number]     Weight:       241.2 lb Date of Birth:  Aug 19, 1955      BSA:          2.094 m Patient  Age:    64 years       BP:           130/56 mmHg Patient Gender: F              HR:           63 bpm. Exam Location:  Inpatient Procedure: 2D Echo, Cardiac Doppler and Color Doppler Indications:    Dyspnea 06.00  History:        Patient has no prior history of Echocardiogram examinations.                 Risk Factors:Hypertension, Diabetes, Dyslipidemia and Current                 Smoker.  Sonographer:    Tiffany Dance Referring Phys: [id number] Boyce Medici GONFA IMPRESSIONS  1. Left ventricular ejection fraction, by estimation, is 60 to 65%. The left ventricle has normal function. The left ventricle has no regional wall motion abnormalities. Left ventricular diastolic parameters are consistent with Grade II diastolic dysfunction (pseudonormalization).  2. Right ventricular systolic function is normal. The right ventricular size is normal.  3. Left atrial size was mildly dilated.  4. The mitral valve is normal in structure. Trivial mitral valve regurgitation. No evidence of mitral stenosis.  5. The aortic valve is normal in structure. Aortic valve regurgitation is not visualized. No aortic stenosis is present.  6. The inferior vena cava is dilated in size with >50% respiratory variability, suggesting right atrial pressure of 8 mmHg. FINDINGS  Left Ventricle: Left ventricular ejection fraction, by estimation, is 60 to 65%. The left ventricle has normal function. The left ventricle has no regional wall motion abnormalities. The left ventricular internal cavity size was normal in size. There is  no left ventricular hypertrophy. Left ventricular diastolic parameters are consistent with Grade II diastolic dysfunction (pseudonormalization). Right Ventricle: The right ventricular size is normal. No increase in right ventricular wall thickness. Right ventricular systolic function is normal. Left Atrium: Left atrial size was mildly dilated. Right Atrium: Right atrial size was normal in size. Pericardium: There is no evidence of  pericardial effusion. Mitral Valve: The mitral valve is normal in structure. Trivial mitral valve regurgitation. No evidence of mitral valve stenosis. Tricuspid Valve: The tricuspid valve is normal in structure. Tricuspid valve regurgitation is not demonstrated. No evidence of tricuspid stenosis. Aortic Valve: The aortic valve is normal in structure. Aortic valve regurgitation is not visualized. No aortic stenosis is present. Pulmonic Valve: The pulmonic valve was normal in structure. Pulmonic valve regurgitation is not visualized. No evidence of pulmonic stenosis. Aorta: The aortic root is normal in size and structure. Venous: The inferior vena cava is dilated in size with greater than 50% respiratory variability, suggesting right atrial pressure of 8 mmHg. IAS/Shunts: No atrial level shunt detected by color flow Doppler.  LEFT VENTRICLE PLAX 2D LVIDd:         5.30 cm  Diastology LVIDs:  3.50 cm  LV e' medial:    7.07 cm/s LV PW:         1.10 cm  LV E/e' medial:  18.4 LV IVS:        0.80 cm  LV e' lateral:   10.10 cm/s LVOT diam:     1.90 cm  LV E/e' lateral: 12.9 LV SV:         85 LV SV Index:   41 LVOT Area:     2.84 cm  RIGHT VENTRICLE             IVC RV Basal diam:  2.70 cm     IVC diam: 2.80 cm RV S prime:     13.60 cm/s TAPSE (M-mode): 2.5 cm LEFT ATRIUM              Index       RIGHT ATRIUM           Index LA diam:        3.90 cm  1.86 cm/m  RA Area:     16.10 cm LA Vol (A2C):   111.0 ml 53.01 ml/m RA Volume:   42.10 ml  20.11 ml/m LA Vol (A4C):   41.9 ml  20.01 ml/m LA Biplane Vol: 73.7 ml  35.20 ml/m  AORTIC VALVE LVOT Vmax:   140.00 cm/s LVOT Vmean:  82.100 cm/s LVOT VTI:    0.300 m  AORTA Ao Root diam: 2.90 cm Ao Asc diam:  3.10 cm MITRAL VALVE MV Area (PHT): 3.21 cm     SHUNTS MV Decel Time: 236 msec     Systemic VTI:  0.30 m MV E velocity: 130.00 cm/s  Systemic Diam: 1.90 cm MV A velocity: 86.60 cm/s MV E/A ratio:  1.50 Donato Schultz MD Electronically signed by Donato Schultz MD Signature  Date/Time: 08/04/2020/1:50:45 PM    Final     ASSESSMENT / PLAN:  Acute hypoxic respiratory failure -was able to dial down oxygen to 6 L nasal cannula Maintain saturation 90 to 92% range. -No bronchospasm associated need IV steroids, continue duo nebs Chest x-ray independently reviewed 5/1 shows increasing hazy opacity especially right lung base, could be layering effusion or new infiltrate versus lung injury, continue antibiotics, received Lasix overnight  Severe sepsis  acute necrotizing fasciitis of left foot -  Surgery following,  cefepime and linezolid  AKI -creatinine has decreased to 1.6, baseline 1.2  PCCM will follow   Cyril Mourning MD. FCCP. [address] [facility] Pager : [phone]  If no response to pager , please call [phone] until 7 pm After 7:00 pm call Elink  [phone]    08/05/2020, 1:12 PM

## 2020-08-06 ENCOUNTER — Inpatient Hospital Stay (HOSPITAL_COMMUNITY): Payer: Medicare Other

## 2020-08-06 ENCOUNTER — Inpatient Hospital Stay (HOSPITAL_COMMUNITY): Payer: Medicare Other | Admitting: Anesthesiology

## 2020-08-06 ENCOUNTER — Encounter (HOSPITAL_COMMUNITY)
Admission: EM | Disposition: A | Discharge status: Skilled Nursing Facility | Payer: Self-pay | Source: Home / Self Care | Attending: Student

## 2020-08-06 DIAGNOSIS — I739 Peripheral vascular disease, unspecified: Secondary | ICD-10-CM | POA: Diagnosis not present

## 2020-08-06 DIAGNOSIS — M726 Necrotizing fasciitis: Secondary | ICD-10-CM | POA: Diagnosis not present

## 2020-08-06 DIAGNOSIS — I151 Hypertension secondary to other renal disorders: Secondary | ICD-10-CM | POA: Diagnosis not present

## 2020-08-06 DIAGNOSIS — J9601 Acute respiratory failure with hypoxia: Secondary | ICD-10-CM | POA: Diagnosis not present

## 2020-08-06 DIAGNOSIS — E876 Hypokalemia: Secondary | ICD-10-CM

## 2020-08-06 DIAGNOSIS — N179 Acute kidney failure, unspecified: Secondary | ICD-10-CM | POA: Diagnosis not present

## 2020-08-06 DIAGNOSIS — E1151 Type 2 diabetes mellitus with diabetic peripheral angiopathy without gangrene: Secondary | ICD-10-CM | POA: Diagnosis not present

## 2020-08-06 DIAGNOSIS — Z9889 Other specified postprocedural states: Secondary | ICD-10-CM | POA: Diagnosis not present

## 2020-08-06 DIAGNOSIS — E11628 Type 2 diabetes mellitus with other skin complications: Secondary | ICD-10-CM

## 2020-08-06 DIAGNOSIS — L089 Local infection of the skin and subcutaneous tissue, unspecified: Secondary | ICD-10-CM

## 2020-08-06 HISTORY — PX: I & D EXTREMITY: SHX5045

## 2020-08-06 LAB — GLUCOSE, CAPILLARY
Glucose-Capillary: 178 mg/dL — ABNORMAL HIGH (ref 70–99)
Glucose-Capillary: 182 mg/dL — ABNORMAL HIGH (ref 70–99)
Glucose-Capillary: 254 mg/dL — ABNORMAL HIGH (ref 70–99)

## 2020-08-06 LAB — CBC WITH DIFFERENTIAL/PLATELET
Abs Immature Granulocytes: 1.37 10*3/uL — ABNORMAL HIGH (ref 0.00–0.07)
Basophils Absolute: 0.1 10*3/uL (ref 0.0–0.1)
Basophils Relative: 1 %
Eosinophils Absolute: 0.1 10*3/uL (ref 0.0–0.5)
Eosinophils Relative: 1 %
HCT: 31.3 % — ABNORMAL LOW (ref 36.0–46.0)
Hemoglobin: 10.1 g/dL — ABNORMAL LOW (ref 12.0–15.0)
Immature Granulocytes: 6 %
Lymphocytes Relative: 7 %
Lymphs Abs: 1.5 10*3/uL (ref 0.7–4.0)
MCH: 29.9 pg (ref 26.0–34.0)
MCHC: 32.3 g/dL (ref 30.0–36.0)
MCV: 92.6 fL (ref 80.0–100.0)
Monocytes Absolute: 1.8 10*3/uL — ABNORMAL HIGH (ref 0.1–1.0)
Monocytes Relative: 8 %
Neutro Abs: 17 10*3/uL — ABNORMAL HIGH (ref 1.7–7.7)
Neutrophils Relative %: 77 %
Platelets: 398 10*3/uL (ref 150–400)
RBC: 3.38 MIL/uL — ABNORMAL LOW (ref 3.87–5.11)
RDW: 14.1 % (ref 11.5–15.5)
WBC: 21.9 10*3/uL — ABNORMAL HIGH (ref 4.0–10.5)
nRBC: 0.1 % (ref 0.0–0.2)

## 2020-08-06 LAB — RENAL FUNCTION PANEL
Albumin: 2.5 g/dL — ABNORMAL LOW (ref 3.5–5.0)
Anion gap: 10 (ref 5–15)
BUN: 48 mg/dL — ABNORMAL HIGH (ref 8–23)
CO2: 24 mmol/L (ref 22–32)
Calcium: 8.7 mg/dL — ABNORMAL LOW (ref 8.9–10.3)
Chloride: 112 mmol/L — ABNORMAL HIGH (ref 98–111)
Creatinine, Ser: 1.42 mg/dL — ABNORMAL HIGH (ref 0.44–1.00)
GFR, Estimated: 42 mL/min — ABNORMAL LOW (ref >60)
Glucose, Bld: 192 mg/dL — ABNORMAL HIGH (ref 70–99)
Phosphorus: 2.5 mg/dL (ref 2.5–4.6)
Potassium: 3 mmol/L — ABNORMAL LOW (ref 3.5–5.1)
Sodium: 146 mmol/L — ABNORMAL HIGH (ref 135–145)

## 2020-08-06 LAB — MAGNESIUM: Magnesium: 2 mg/dL (ref 1.7–2.4)

## 2020-08-06 SURGERY — IRRIGATION AND DEBRIDEMENT EXTREMITY
Anesthesia: Monitor Anesthesia Care | Site: Foot | Laterality: Left

## 2020-08-06 MED ORDER — LACTATED RINGERS IV SOLN: INTRAVENOUS | Status: DC | PRN

## 2020-08-06 MED ORDER — ONDANSETRON HCL 4 MG/2ML IJ SOLN: 4.0000 mg | Freq: Once | INTRAMUSCULAR | Status: DC | PRN

## 2020-08-06 MED ORDER — POTASSIUM CHLORIDE CRYS ER 20 MEQ PO TBCR: 40.0000 meq | EXTENDED_RELEASE_TABLET | ORAL | Status: AC

## 2020-08-06 MED ORDER — FENTANYL CITRATE (PF) 100 MCG/2ML IJ SOLN
25.0000 ug | Freq: Once | INTRAMUSCULAR | Status: AC
Start: 2020-08-06 — End: 2020-08-06
  Administered 2020-08-06: 25 ug via INTRAVENOUS
  Filled 2020-08-06: qty 2

## 2020-08-06 MED ORDER — KETAMINE HCL 10 MG/ML IJ SOLN
INTRAMUSCULAR | Status: AC
  Filled 2020-08-06: qty 1

## 2020-08-06 MED ORDER — FENTANYL CITRATE (PF) 100 MCG/2ML IJ SOLN: 25.0000 ug | INTRAMUSCULAR | Status: DC | PRN

## 2020-08-06 MED ORDER — FENTANYL CITRATE (PF) 100 MCG/2ML IJ SOLN
INTRAMUSCULAR | Status: AC
  Filled 2020-08-06: qty 2

## 2020-08-06 MED ORDER — BUPIVACAINE HCL 0.25 % IJ SOLN
INTRAMUSCULAR | Status: AC
  Filled 2020-08-06: qty 1

## 2020-08-06 MED ORDER — ONDANSETRON HCL 4 MG/2ML IJ SOLN
INTRAMUSCULAR | Status: DC | PRN
  Administered 2020-08-06: 4 mg via INTRAVENOUS

## 2020-08-06 MED ORDER — ACETAMINOPHEN 160 MG/5ML PO SOLN: 325.0000 mg | ORAL | Status: DC | PRN

## 2020-08-06 MED ORDER — MEPERIDINE HCL 50 MG/ML IJ SOLN: 6.2500 mg | INTRAMUSCULAR | Status: DC | PRN

## 2020-08-06 MED ORDER — PROPOFOL 500 MG/50ML IV EMUL
INTRAVENOUS | Status: DC | PRN
  Administered 2020-08-06: 50 ug/kg/min via INTRAVENOUS

## 2020-08-06 MED ORDER — BUPIVACAINE HCL 0.5 % IJ SOLN
INTRAMUSCULAR | Status: DC | PRN
  Administered 2020-08-06: 20 mL

## 2020-08-06 MED ORDER — OXYCODONE HCL 5 MG PO TABS: 5.0000 mg | ORAL_TABLET | Freq: Once | ORAL | Status: DC | PRN

## 2020-08-06 MED ORDER — OXYCODONE HCL 5 MG/5ML PO SOLN: 5.0000 mg | Freq: Once | ORAL | Status: DC | PRN

## 2020-08-06 MED ORDER — KETAMINE HCL 10 MG/ML IJ SOLN
INTRAMUSCULAR | Status: DC | PRN
  Administered 2020-08-06: 40 mg via INTRAVENOUS
  Administered 2020-08-06: 20 mg via INTRAVENOUS
  Administered 2020-08-06 (×2): 30 mg via INTRAVENOUS

## 2020-08-06 MED ORDER — SODIUM CHLORIDE 0.9 % IR SOLN
Status: DC | PRN
  Administered 2020-08-06: 3000 mL

## 2020-08-06 MED ORDER — BUPIVACAINE HCL (PF) 0.5 % IJ SOLN
INTRAMUSCULAR | Status: DC | PRN
  Administered 2020-08-06: 10 mL

## 2020-08-06 MED ORDER — SPIRONOLACTONE 25 MG PO TABS
25.0000 mg | ORAL_TABLET | Freq: Every day | ORAL | Status: DC
  Administered 2020-08-06 – 2020-08-11 (×6): 25 mg via ORAL
  Filled 2020-08-06 (×6): qty 1

## 2020-08-06 MED ORDER — LIDOCAINE HCL 2 % IJ SOLN
INTRAMUSCULAR | Status: AC
  Filled 2020-08-06: qty 20

## 2020-08-06 MED ORDER — GLYCOPYRROLATE PF 0.2 MG/ML IJ SOSY
PREFILLED_SYRINGE | INTRAMUSCULAR | Status: DC | PRN
  Administered 2020-08-06: .2 mg via INTRAVENOUS

## 2020-08-06 MED ORDER — PROPOFOL 500 MG/50ML IV EMUL
INTRAVENOUS | Status: DC | PRN
  Administered 2020-08-06: 20 mg via INTRAVENOUS

## 2020-08-06 MED ORDER — ACETAMINOPHEN 325 MG PO TABS: 325.0000 mg | ORAL_TABLET | ORAL | Status: DC | PRN

## 2020-08-06 MED ORDER — LIDOCAINE HCL 2 % IJ SOLN
INTRAMUSCULAR | Status: DC | PRN
  Administered 2020-08-06: 10 mL

## 2020-08-06 MED ORDER — FENTANYL CITRATE (PF) 100 MCG/2ML IJ SOLN
50.0000 ug | INTRAMUSCULAR | Status: DC
  Administered 2020-08-07: 50 ug via INTRAVENOUS
  Filled 2020-08-06: qty 2

## 2020-08-06 SURGICAL SUPPLY — 43 items
BNDG ELASTIC 3X5.8 VLCR STR LF (GAUZE/BANDAGES/DRESSINGS) IMPLANT
BNDG ELASTIC 4X5.8 VLCR STR LF (GAUZE/BANDAGES/DRESSINGS) ×2 IMPLANT
BNDG ESMARK 4X9 LF (GAUZE/BANDAGES/DRESSINGS) IMPLANT
BNDG GAUZE ELAST 4 BULKY (GAUZE/BANDAGES/DRESSINGS) ×4 IMPLANT
CHLORAPREP W/TINT 26 (MISCELLANEOUS) ×2 IMPLANT
COVER MAYO STAND STRL (DRAPES) ×2 IMPLANT
COVER WAND RF STERILE (DRAPES) IMPLANT
CUFF TOURN SGL QUICK 34 (TOURNIQUET CUFF)
CUFF TRNQT CYL 34X4.125X (TOURNIQUET CUFF) IMPLANT
DRAPE IMP U-DRAPE 54X76 (DRAPES) ×2 IMPLANT
DRAPE SURG 17X23 STRL (DRAPES) IMPLANT
DRAPE U-SHAPE 47X51 STRL (DRAPES) ×2 IMPLANT
DRSG EMULSION OIL 3X3 NADH (GAUZE/BANDAGES/DRESSINGS) ×2 IMPLANT
DRSG KUZMA FLUFF (GAUZE/BANDAGES/DRESSINGS) ×2 IMPLANT
DRSG PAD ABDOMINAL 8X10 ST (GAUZE/BANDAGES/DRESSINGS) ×2 IMPLANT
GAUZE PACKING IODOFORM 1X5 (PACKING) ×2 IMPLANT
GAUZE SPONGE 4X4 12PLY STRL (GAUZE/BANDAGES/DRESSINGS) ×2 IMPLANT
GLOVE SURG ENC MOIS LTX SZ7 (GLOVE) ×2 IMPLANT
GLOVE SURG UNDER POLY LF SZ7.5 (GLOVE) ×2 IMPLANT
GOWN STRL REUS W/ TWL LRG LVL3 (GOWN DISPOSABLE) ×1 IMPLANT
GOWN STRL REUS W/ TWL XL LVL3 (GOWN DISPOSABLE) IMPLANT
GOWN STRL REUS W/TWL LRG LVL3 (GOWN DISPOSABLE) ×1
GOWN STRL REUS W/TWL XL LVL3 (GOWN DISPOSABLE)
KIT BASIN OR (CUSTOM PROCEDURE TRAY) ×2 IMPLANT
NDL SAFETY ECLIPSE 18X1.5 (NEEDLE) IMPLANT
NEEDLE BIOPSY JAMSHIDI 11X6 (NEEDLE) IMPLANT
NEEDLE HYPO 18GX1.5 SHARP (NEEDLE)
NEEDLE HYPO 25X1 1.5 SAFETY (NEEDLE) ×2 IMPLANT
NS IRRIG 1000ML POUR BTL (IV SOLUTION) ×2 IMPLANT
PACK ORTHO EXTREMITY (CUSTOM PROCEDURE TRAY) ×2 IMPLANT
PADDING CAST ABS 4INX4YD NS (CAST SUPPLIES)
PADDING CAST ABS COTTON 4X4 ST (CAST SUPPLIES) IMPLANT
PENCIL SMOKE EVACUATOR (MISCELLANEOUS) IMPLANT
STAPLER VISISTAT 35W (STAPLE) IMPLANT
STOCKINETTE 6  STRL (DRAPES)
STOCKINETTE 6 STRL (DRAPES) IMPLANT
SUT MNCRL AB 3-0 PS2 18 (SUTURE) IMPLANT
SUT MNCRL AB 4-0 PS2 18 (SUTURE) IMPLANT
SUT MON AB 5-0 PS2 18 (SUTURE) IMPLANT
SUT PROLENE 4 0 PS 2 18 (SUTURE) IMPLANT
SUT VIC AB 4-0 P2 18 (SUTURE) IMPLANT
SYR CONTROL 10ML LL (SYRINGE) ×2 IMPLANT
UNDERPAD 30X36 HEAVY ABSORB (UNDERPADS AND DIAPERS) ×2 IMPLANT
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
## 2020-08-06 NOTE — TOC Initial Note (Signed)
Transition of Care [facility]) - Initial/Assessment Note    Patient Details  Name: Shirley Chen MRN: [id number] Date of Birth: [birth year]  Transition of Care [facility]) CM/SW Contact:    Golda Acre, RN Phone Number: 08/06/2020, 9:08 AM  Clinical Narrative:                 65 year old smoker, diabetic admitted with shortness of breath, found to have necrotizing fasciitis of left foot.  Increasing hypoxia requiring 6 L, hence PCCM consulted  SIGNIFICANT EVENTS  4/30 rapid response called for mental status changes 4/30 debridement of left foot under general anesthesia >> purulent drainage across midfoot, left open  STUDIES:  CT chest without contrast 4/28 no infiltrate VQ scan 4/28 no perfusion defects B Lower extremity duplex negative Echo 4/30 normal LVEF grade 2 diastolic dysfunction   HISTORY OF PRESENT ILLNESS: She was admitted 4/28 with initial complaints of shortness of breath, found to be hypoxic requiring 3 L, Rapidly progressing to 6 L nasal cannula. She was mildly hypotensive and received fluids.  Initial labs significant for WBC of 24K and AKI with creatinine of 2.1.  VQ scan did not show any perfusion defects .  BNP was 383 and troponin was mildly elevated. She was treated with cefepime and Flagyl empirically for severe sepsis, then noted to have painful left foot .  Rapid response was called due to mental status changes overnight on 4/30, head CT negative.  MRI showed air in the dorsal aspect of the forefoot tracking proximally, there were small amount of air along the plantar aspect of the foot.  Concern was raised for necrotizing infection and early osteomyelitis of the third proximal phalanx.  Podiatry was consulted and plan is for OR 5/1.  Linezolid has been added to cefepime and Flagyl. PCCM is consulted for hypoxia and perioperative assistance PLAN: may need hhc due to wound to foot.  Will follow for dc and toc needs.  Expected Discharge Plan: Home w Home Health  Services Barriers to Discharge: Continued Medical Work up   Patient Goals and CMS Choice Patient states their goals for this hospitalization and ongoing recovery are:: to go home CMS Medicare.gov Compare Post Acute Care list provided to:: Patient Choice offered to / list presented to : Patient  Expected Discharge Plan and Services Expected Discharge Plan: Home w Home Health Services   Discharge Planning Services: CM Consult   Living arrangements for the past 2 months: Apartment                                      Prior Living Arrangements/Services Living arrangements for the past 2 months: Apartment Lives with:: Self Patient language and need for interpreter reviewed:: Yes Do you feel safe going back to the place where you live?: Yes      Need for Family Participation in Patient Care: No (Comment) Care giver support system in place?: No (comment)   Criminal Activity/Legal Involvement Pertinent to Current Situation/Hospitalization: No - Comment as needed  Activities of Daily Living Home Assistive Devices/Equipment: Dan Humphreys (specify type) ADL Screening (condition at time of admission) Patient's cognitive ability adequate to safely complete daily activities?: Yes Is the patient deaf or have difficulty hearing?: No Does the patient have difficulty seeing, even when wearing glasses/contacts?: No Does the patient have difficulty concentrating, remembering, or making decisions?: No Patient able to express need for assistance with ADLs?: Yes Does the patient  have difficulty dressing or bathing?: Yes Independently performs ADLs?: No Does the patient have difficulty walking or climbing stairs?: Yes Weakness of Legs: Both Weakness of Arms/Hands: None  Permission Sought/Granted                  Emotional Assessment Appearance:: Appears stated age Attitude/Demeanor/Rapport: Engaged Affect (typically observed): Calm Orientation: : Oriented to Place,Oriented to  Self,Oriented to Situation,Oriented to  Time Alcohol / Substance Use: Not Applicable Psych Involvement: No (comment)  Admission diagnosis:  Hypoxia [R09.02] Acute respiratory failure with hypoxia (HCC) [J96.01] AKI (acute kidney injury) (HCC) [N17.9] Anemia, unspecified type [D64.9] Patient Active Problem List   Diagnosis Date Noted  . Necrotizing fasciitis (HCC)   . Acute respiratory failure with hypoxia (HCC) 08/02/2020  . AKI (acute kidney injury) (HCC) 08/02/2020  . Status post amputation of great toe, right (HCC) 03/11/2019  . Hyperlipidemia 06/16/2017  . Mononeuropathy due to underlying disease 02/12/2017  . Type 2 diabetes mellitus (HCC) 11/27/2014  . PAD (peripheral artery disease) (HCC) 11/26/2014  . HTN (hypertension) 11/26/2014  . Diabetes mellitus with peripheral vascular disease (HCC) 11/26/2014  . Atherosclerosis of native arteries of the extremities with ulceration (HCC) 10/27/2014  . Tobacco abuse 10/20/2014   PCP:  Olive Bass, FNP Pharmacy:   [facility] [address], [place] - [address] [place] [address] Phone: [phone] Fax: [phone]  [facility] [place], [place] - [address] [place], [address] [place], [address] [place] [address] Phone: [phone] Fax: [phone]     Social Determinants of Health (SDOH) Interventions    Readmission Risk Interventions No flowsheet data found.

## 2020-08-06 NOTE — Progress Notes (Signed)
SLP Cancellation Note  Patient Details Name: Shirley Chen MRN: [id number] DOB: 1955-08-23   Cancelled treatment:       Reason Eval/Treat Not Completed: Patient declined, no reason specified Upon entering room, patient understandably upset and emotional as she is afraid she will have to have her leg amputated. She declined any PO's at this time and said all she wanted was for SLP to pray for her that she doesn't lose her foot. SLP then spoke with her RN who reported that patient has been much more alert and oriented today and so decision was made to upgrade her solids from Dys 2 to Dys 3 as main limiting factor in her swallow safety as per BSE previous date was confusion/AMS.  Angela Nevin, MA, CCC-SLP Speech Therapy

## 2020-08-06 NOTE — Transfer of Care (Signed)
Immediate Anesthesia Transfer of Care Note  Patient: Shirley Chen  Procedure(s) Performed: IRRIGATION AND DEBRIDEMENT LEFT FOOT WOUND (Left Foot)  Patient Location: PACU  Anesthesia Type:General  Level of Consciousness: awake  Airway & Oxygen Therapy: Patient Spontanous Breathing and Patient connected to face mask  Post-op Assessment: Report given to RN and Post -op Vital signs reviewed and stable  Post vital signs: Reviewed and stable  Last Vitals:  Vitals Value Taken Time  BP 153/71 08/06/20 1203  Temp    Pulse 73 08/06/20 1206  Resp 21 08/06/20 1206  SpO2 94 % 08/06/20 1206  Vitals shown include unvalidated device data.  Last Pain:  Vitals:   08/06/20 0030  TempSrc: Oral  PainSc:       Patients Stated Pain Goal: 3 (08/04/20 0917)  Complications: No complications documented.

## 2020-08-06 NOTE — Anesthesia Preprocedure Evaluation (Addendum)
Anesthesia Evaluation  Patient identified by MRN, date of birth, ID band Patient awake  General Assessment Comment:Patient unable to respond to questions. Mildly tachypneic Febrile  Reviewed: Allergy & Precautions, NPO status , Patient's Chart, lab work & pertinent test results  History of Anesthesia Complications Negative for: history of anesthetic complications  Airway Mallampati: II  TM Distance: >3 FB Neck ROM: Full    Dental no notable dental hx. (+) Edentulous Upper, Edentulous Lower   Pulmonary Current Smoker and Patient abstained from smoking.,    Pulmonary exam normal breath sounds clear to auscultation (-) decreased breath sounds      Cardiovascular hypertension, Pt. on home beta blockers + Peripheral Vascular Disease  Normal cardiovascular exam Rhythm:Regular Rate:Normal     Neuro/Psych negative psych ROS   GI/Hepatic   Endo/Other  diabetes, Insulin DependentMorbid obesity  Renal/GU Renal InsufficiencyRenal disease     Musculoskeletal  (+) Arthritis , Osteoarthritis,    Abdominal   Peds  Hematology   Anesthesia Other Findings   Reproductive/Obstetrics                          Anesthesia Physical  Anesthesia Plan  ASA: IV  Anesthesia Plan: General and Regional   Post-op Pain Management: GA combined w/ Regional for post-op pain   Induction: Intravenous  PONV Risk Score and Plan: 2 and Ondansetron and Treatment may vary due to age or medical condition  Airway Management Planned: LMA  Additional Equipment: None  Intra-op Plan:   Post-operative Plan: Extubation in OR  Informed Consent:   Plan Discussed with: Anesthesiologist and CRNA  Anesthesia Plan Comments:        Anesthesia Quick Evaluation

## 2020-08-06 NOTE — Progress Notes (Signed)
PT Cancellation Note  Patient Details Name: Shirley Chen MRN: [id number] DOB: [birth year]   Cancelled Treatment:    Reason Eval/Treat Not Completed: Patient at procedure or test/unavailable, I and D left foot.   Rada Hay 08/06/2020, 11:12 AM  Blanchard Kelch PT Acute Rehabilitation Services Pager [phone] Office [phone]

## 2020-08-06 NOTE — Progress Notes (Signed)
ABI's have been completed. Preliminary results can be found in CV Proc through chart review.   08/06/20 8:51 AM Olen Cordial RVT

## 2020-08-06 NOTE — Progress Notes (Signed)
SLP Cancellation Note  Patient Details Name: Shirley Chen MRN: [id number] DOB: 1955-12-12   Cancelled treatment:       Reason Eval/Treat Not Completed: Patient at procedure or test/unavailable. Patient being transported for irrigation and debridement of left foot wound. SLP will attempt to return this PM.   Angela Nevin, MA, CCC-SLP Speech Therapy

## 2020-08-06 NOTE — Op Note (Signed)
Surgeon: Surgeon(s): Candelaria Stagers, DPM  Assistants: NOne Pre-operative diagnosis: LEFT FOOT ABSCESS  Post-operative diagnosis: same Procedure: Procedure(s) (LRB): IRRIGATION AND DEBRIDEMENT LEFT FOOT WOUND (Left) with incision and drainage. Pathology: * No specimens in log *  Pertinent Intra-op findings: Still significant purulent drainage noted.  Multiple cultures were obtained.  Vascular status is poor Anesthesia: Choice  Hemostasis: * No tourniquets in log * EBL: Minimal Materials: Iodoform packing Injectables: None Complications: None  Indications for surgery: A 65 y.o. female presents with left foot abscess that was previously I&D was done with packing open. Patient has failed all conservative therapy including but not limited to IV antibiotics local wound care. She wishes to have surgical correction of the foot/deformity. It was determined that patient would benefit from revisional incision and drainage washout debridement. Informed surgical risk consent was reviewed and read aloud to the patient.  I reviewed the films.  I have discussed my findings with the patient in great detail.  I have discussed all risks including but not limited to infection, stiffness, scarring, limp, disability, deformity, damage to blood vessels and nerves, numbness, poor healing, need for braces, arthritis, chronic pain, amputation, death.  All benefits and realistic expectations discussed in great detail.  I have made no promises as to the outcome.  I have provided realistic expectations.  I have offered the patient a 2nd opinion, which they have declined and assured me they preferred to proceed despite the risks   Procedure in detail: The patient was both verbally and visually identified by myself, the nursing staff, and anesthesia staff in the preoperative holding area. They were then transferred to the operating room and placed on the operative table in supine position.  Attention was directed dorsal  midfoot, a previous dorsal incision was evaluated.  At this time severe amount of purulent drainage was expressed.  Using mosquito all the tissue planes were thoroughly decompressed and all the purulent drainage was decompressed.  Culture was taken and sent to microbiology in standard technique approximately 5 cc of purulent drainage was removed.  Once the area appeared clear of infection 3 L saline solution was used to irrigate via pulse lavage.  At this time no further purulent drainage was noted after the pulse lavage.  Decision was made to pack it open we will plan on coming back as part of a staged procedure to perform delayed primary closure.  Patient will need vascular work-up and intervention prior to undergoing closure.  We will allow her to heal by secondary intention for now once vascular status has improved bring her back to the operating for primary closure.  The wound was packed with iodoform packing Kerlix Ace bandage.  We will plan on returning back to the operating room on Monday for delayed primary closure.  All bony prominences were adequately padded.  At the conclusion of the procedure the patient was awoken from anesthesia and found to have tolerated the procedure well any complications. There were transferred to PACU with vital signs stable and vascular status intact.  Nicholes Rough, DPM

## 2020-08-06 NOTE — Progress Notes (Signed)
PROGRESS NOTE  Shirley Chen YSA:[id number] DOB: 1955-10-31   PCP: Marrian Salvage, FNP  Patient is from: Home  DOA: 08/01/2020 LOS: 4  Chief complaints: Shortness of breath  Brief Narrative / Interim history: 65 year old F with PMH of DM-2, HTN, CKD-3, PVD, HLD and tobacco abuse presenting with progressive shortness of breath, lower extremity edema and nausea and admitted with SIRS, acute respiratory failure with hypoxia and elevated D-dimer.  She was empirically started on broad-spectrum antibiotics and IV heparin pending cultures, VQ scan and LE [place].  CT chest without contrast without acute finding.  She also had mildly elevated troponin and BNP.   VQ scan with low probability for central PE.  LE [place] negative for DVT.  Heparin discontinued.  However, she continued to have significant O2 requirement likely due to pneumonia and CHF exacerbation.  Started on IV Lasix in addition to broad-spectrum antibiotics.  Respiratory status improving.  Patient was noted to have left foot swelling and erythema on 4/30.  MRI foot with severe soft edema throughout the foot with area and dorsal aspect of the forefoot tracking proximally to the level of the TMT joint most concerning for necrotizing infection and possible reactive marrow edema vs osteomyelitis within the third proximal phalanx.  ID and podiatry consulted.  Linezolid added.  Patient underwent irrigation and debridement with incision and drainage by podiatry, Dr. Posey Pronto on 4/30 and 5/2.   Left lower extremity ABI concerning for PAD.  Vascular surgery consulted.  Subjective: Seen and examined earlier this morning.  No major events overnight of this morning.  No complaints other than generalized weakness.  She denies chest pain, shortness of breath, left foot pain, GI or UTI symptoms.  Saturating at 89 to 90% on room air  Objective: Vitals:   08/06/20 1215 08/06/20 1230 08/06/20 1245 08/06/20 1300  BP: (!) 162/75 (!) 171/73 (!) 149/71 (!)  155/78  Pulse: 74 73 72 73  Resp: '20 18 17 16  ' Temp:   98 F (36.7 C)   TempSrc:      SpO2: 96% 90% 92% 91%  Weight:      Height:        Intake/Output Summary (Last 24 hours) at 08/06/2020 1331 Last data filed at 08/06/2020 1154 Gross per 24 hour  Intake 1400 ml  Output 3120 ml  Net -1720 ml   Filed Weights   08/03/20 0418 08/05/20 0518 08/06/20 0556  Weight: 109.4 kg 108.6 kg 105.5 kg    Examination:  GENERAL: No apparent distress.  Nontoxic. HEENT: MMM.  Vision and hearing grossly intact.  NECK: Supple.  No apparent JVD.  RESP: 90% on RA.  No IWOB.  Fair aeration bilaterally. CVS:  RRR. Heart sounds normal.  ABD/GI/GU: BS+. Abd soft, NTND.  MSK/EXT:  Moves extremities. No apparent deformity. No edema.  SKIN: no apparent skin lesion or wound NEURO: Awake but not quite alert.  Oriented to self, place and month.  No apparent focal neuro deficit but generalized weakness PSYCH: Calm. Normal affect.    Procedures:  08/04/2020-irrigation and debridement with incision and drainage of left foot infection. 08/06/2020-irrigation and debridement with incision and drainage of left foot infection  Microbiology summarized: COVID-19 and influenza PCR nonreactive. MRSA PCR negative. Blood and urine cultures NGTD Abscess culture NGTD.  Assessment & Plan: Acute respiratory failure with hypoxia: Multifactorial including CAP, severe sepsis and acute CHF. Patient is not on oxygen at baseline.  Initial CT chest and CXR without significant finding.  D-dimer elevated but VQ  scan and LE [place] negative.  BNP 500.  Repeat CXR with bibasilar and right lung opacity.  She was weaned to 2 L by [place] but was not using it when seen this morning -Continue cefepime, Flagyl and azithromycin -Added linezolid on 4/30 for left foot infection. -Continue IV Lasix 40 mg twice daily -Wean oxygen as able. -Incentive spirometry/OOB/PT/OT -Appreciate help by PCCM.  Severe sepsis due to  left foot cellulitis with  abscess and possible community-acquired pneumonia POA: Has leukocytosis, tachycardia, tachypnea, AKI, respiratory failure and hypotension.  MRI left foot raised concern for necrotizing fasciitis although her had leukocytosis for a week and elevated ESR argues against necrotizing fasciitis. -Status post I&D of left foot by podiatry, Dr. Posey Pronto on 4/30 and 5/2. -Nonweightbearing on left foot. -Patient will return to the OR for I&D and wound closure. -Currently on IV cefepime, Flagyl and linezolid -Infectious disease and podiatry following following.  Left lower extremity PAD: ABI 0.65 with monophasic waveforms -Vascular surgery consulted   Acute diastolic CHF: TTE with LVEF of 60 to 65%, G2-DD.  BNP elevated to 500.  CXR with bibasilar and right lung opacities.  Started on IV Lasix.  Had 3.2 L UOP/24 hours.  Renal function and respiratory status improved. -Continue IV Lasix 40 mg twice daily -Added p.o. Aldactone to help with hypernatremia and hypokalemia -Monitor fluid status, renal functions and electrolytes   AKI on CKD-3B/azotemia: Baseline Cr 1.1-1.2.  AKI likely in the setting of possible sepsis and nephrotoxic meds (NSAIDs and ACE inhibitor's). FENa 1.6% suggesting intrinsic process.  UPC 0.49.  Renal US without significant finding.  Improving Recent Labs    02/02/20 0811 06/21/20 0835 07/29/20 2255 08/01/20 0041 08/02/20 0559 08/03/20 0411 08/04/20 0335 08/05/20 0231 08/06/20 0244  BUN 23 20 25* 46* 46* 56* 64* 56* 48*  CREATININE 1.34* 1.19 1.54* 2.13* 2.27* 2.15* 1.90* 1.66* 1.42*  -Recheck renal function in the morning -Continue holding ACE inhibitor's.  Acute metabolic encephalopathy: Could be from sepsis or iatrogenic from pain meds.  Improved.  She is more alert today.  Oriented to self, place and month.  ABG reassuring. -Treat treatable causes as above -Discontinued p.o. oxycodone -Reorientation and delirium precautions -Continue dysphagia 2 diet  Elevated  troponin: Likely demand ischemia in the setting of the above.  Hypernatremia: Na 147> 142> 146. -Added Aldactone as above  Hypokalemia: Likely due to diuretics. -K-Dur 40 mEq x 2 -P.o. Aldactone as above  Uncontrolled IDDM-2 with hyperglycemia, CKD and peripheral neuropathy: A1c 8.0% on 06/21/2020.  On Humalog 75/25 at 30 units twice daily, metformin Recent Labs  Lab 08/05/20 0802 08/05/20 1222 08/05/20 1620 08/05/20 2109 08/06/20 0852  GLUCAP 187* 199* 176* 155* 178*  -Continue SSI-moderate -Continue atorvastatin, Zetia and gabapentin.  Proteinuria: UA with > 300 proteins.  UPC 0.5 arguing against nephrotic syndrome. -Resume ACE inhibitor's once renal function improves  Leukocytosis/bandemia: WBC 25>>18>25> 22 -Antibiotics as above -Continue monitoring  Hyperbilirubinemia: Resolved. -Recheck in the morning  Hypotension: Likely due to sepsis.    Hypertension: BP elevated this morning.  She did not get her Coreg last night. -Continue home Coreg and amlodipine -Diuretics as above -As needed labetalol -Continue holding lisinopril  Normocytic anemia: Drop in Hgb likely dilutional.  She denies melena or hematochezia.  Transfused 1 units on 4/30 with appropriate response. Recent Labs    02/02/20 0811 06/21/20 0835 07/29/20 2255 08/02/20 0041 08/02/20 0559 08/03/20 0411 08/04/20 0335 08/04/20 2007 08/05/20 0231 08/06/20 0244  HGB 11.8* 11.5* 10.2* 8.2* 8.4* 7.7*  7.9* 9.1* 9.5* 10.1*  -Continue monitoring -Monitor daily  Tobacco use disorder: Smokes about half a pack a day. -Encourage tobacco cessation -She is not interested in nicotine patch.  Chronic back pain-recent MRI of T and L-spine with moderate sized rightward disc extrusion at L4-5 with severe lateral recess stenosis and moderate multifactorial bilateral L4 neuroforaminal stenosis.  No red flags -Scheduled Tylenol with as needed tramadol -Outpatient follow-up -PT/OT eval   Hyperuricemia: Due to renal  failure?  Gout? -Continue colchicine  Morbid obesity Body mass index is 41.2 kg/m.         DVT prophylaxis:  On subcu heparin  Code Status: Full code Family Communication: Updated Mr. Ronnald Ramp, patient's son-in-law with patient's permission on 5/1 Level of care: Stepdown.  Transfer to progressive care. Status is: Inpatient  Remains inpatient appropriate because:Persistent severe electrolyte disturbances, Altered mental status, Ongoing diagnostic testing needed not appropriate for outpatient work up, IV treatments appropriate due to intensity of illness or inability to take PO and Inpatient level of care appropriate due to severity of illness   Dispo: The patient is from: Home              Anticipated d/c is to: Home              Patient currently is not medically stable to d/c.   Difficult to place patient No       Consultants:  PCCM Infectious disease Podiatery Vascular surgery   Sch Meds:  Scheduled Meds: . sodium chloride   Intravenous Once  . amLODipine  10 mg Oral Daily  . atorvastatin  40 mg Oral Daily  . azithromycin  250 mg Oral Daily  . carvedilol  25 mg Oral BID WC  . Chlorhexidine Gluconate Cloth  6 each Topical Daily  . colchicine  0.6 mg Oral Daily  . ezetimibe  10 mg Oral Daily  . fentaNYL  50-100 mcg Intravenous UD  . furosemide  40 mg Intravenous BID  . gabapentin  300 mg Oral BID  . heparin  5,000 Units Subcutaneous Q8H  . insulin aspart  0-15 Units Subcutaneous TID WC  . insulin aspart  0-5 Units Subcutaneous QHS  . mouth rinse  15 mL Mouth Rinse BID  . potassium chloride  40 mEq Oral Q4H  . spironolactone  25 mg Oral Daily   Continuous Infusions: . ceFEPime (MAXIPIME) IV Stopped (08/06/20 0240)  . linezolid (ZYVOX) IV Stopped (08/05/20 2230)  . metronidazole 0 mg (08/06/20 0400)   PRN Meds:.acetaminophen **OR** acetaminophen, labetalol, traMADol  Antimicrobials: Anti-infectives (From admission, onward)   Start     Dose/Rate Route  Frequency Ordered Stop   08/06/20 1000  azithromycin (ZITHROMAX) tablet 250 mg        250 mg Oral Daily 08/05/20 1628 08/08/20 0959   08/04/20 1430  DAPTOmycin (CUBICIN) 600 mg in sodium chloride 0.9 % IVPB  Status:  Discontinued        8 mg/kg  75.2 kg (Adjusted) 124 mL/hr over 30 Minutes Intravenous Daily 08/04/20 1317 08/04/20 1324   08/04/20 1430  linezolid (ZYVOX) IVPB 600 mg        600 mg 300 mL/hr over 60 Minutes Intravenous Every 12 hours 08/04/20 1331     08/04/20 0300  vancomycin (VANCOREADY) IVPB 1250 mg/250 mL  Status:  Discontinued        1,250 mg 166.7 mL/hr over 90 Minutes Intravenous Every 48 hours 08/02/20 0508 08/02/20 1115   08/03/20 1300  azithromycin (ZITHROMAX) tablet 500  mg  Status:  Discontinued        500 mg Oral Daily 08/03/20 1203 08/05/20 1621   08/02/20 1900  metroNIDAZOLE (FLAGYL) IVPB 500 mg        500 mg 100 mL/hr over 60 Minutes Intravenous Every 8 hours 08/02/20 1059     08/02/20 1400  ceFEPIme (MAXIPIME) 2 g in sodium chloride 0.9 % 100 mL IVPB        2 g 200 mL/hr over 30 Minutes Intravenous Every 12 hours 08/02/20 0502     08/02/20 1100  metroNIDAZOLE (FLAGYL) IVPB 500 mg  Status:  Discontinued        500 mg 100 mL/hr over 60 Minutes Intravenous Every 8 hours 08/02/20 0500 08/02/20 1100   08/02/20 0515  ceFEPIme (MAXIPIME) 2 g in sodium chloride 0.9 % 100 mL IVPB  Status:  Discontinued        2 g 200 mL/hr over 30 Minutes Intravenous  Once 08/02/20 0500 08/02/20 0502   08/02/20 0515  vancomycin (VANCOCIN) IVPB 1000 mg/200 mL premix  Status:  Discontinued        1,000 mg 200 mL/hr over 60 Minutes Intravenous  Once 08/02/20 0500 08/02/20 0503   08/02/20 0230  vancomycin (VANCOREADY) IVPB 2000 mg/400 mL        2,000 mg 200 mL/hr over 120 Minutes Intravenous STAT 08/02/20 0218 08/02/20 0441   08/02/20 0215  ceFEPIme (MAXIPIME) 2 g in sodium chloride 0.9 % 100 mL IVPB        2 g 200 mL/hr over 30 Minutes Intravenous  Once 08/02/20 0208 08/02/20 0250    08/02/20 0215  metroNIDAZOLE (FLAGYL) IVPB 500 mg        500 mg 100 mL/hr over 60 Minutes Intravenous  Once 08/02/20 0208 08/02/20 0343   08/02/20 0215  vancomycin (VANCOCIN) IVPB 1000 mg/200 mL premix  Status:  Discontinued        1,000 mg 200 mL/hr over 60 Minutes Intravenous  Once 08/02/20 0208 08/02/20 0218       I have personally reviewed the following labs and images: CBC: Recent Labs  Lab 08/02/20 0041 08/02/20 0559 08/03/20 0411 08/04/20 0335 08/04/20 2007 08/05/20 0231 08/06/20 0244  WBC 24.9* 25.1* 20.7* 18.8*  --  25.8* 21.9*  NEUTROABS 21.7* 21.6*  --  16.1*  --  21.7* 17.0*  HGB 8.2* 8.4* 7.7* 7.9* 9.1* 9.5* 10.1*  HCT 26.6* 27.7* 25.7* 25.4* 28.9* 29.8* 31.3*  MCV 96.0 97.2 97.0 96.9  --  95.2 92.6  PLT 311 289 280 302  --  360 398   BMP &GFR Recent Labs  Lab 08/02/20 0559 08/03/20 0411 08/04/20 0335 08/05/20 0231 08/06/20 0244  NA 147* 142 145 142 146*  K 4.2 4.0 3.9 3.5 3.0*  CL 114* 112* 115* 113* 112*  CO2 21* 21* 20* 20* 24  GLUCOSE 223* 155* 126* 187* 192*  BUN 46* 56* 64* 56* 48*  CREATININE 2.27* 2.15* 1.90* 1.66* 1.42*  CALCIUM 8.6* 8.5* 8.8* 8.7* 8.7*  MG  --  2.5* 2.3 2.3 2.0  PHOS  --  4.7* 4.2 3.5 2.5   Estimated Creatinine Clearance: 47.1 mL/min (A) (by C-G formula based on SCr of 1.42 mg/dL (H)). Liver & Pancreas: Recent Labs  Lab 08/02/20 0559 08/03/20 0411 08/04/20 0335 08/05/20 0231 08/06/20 0244  AST 17  --  16 14*  --   ALT 19  --  17 16  --   ALKPHOS 135*  --  131* 128*  --  BILITOT 1.4*  --  1.0 0.9  --   PROT 6.1*  --  6.0* 6.3*  --   ALBUMIN 2.7* 2.5* 2.4*  2.4* 2.4* 2.5*   No results for input(s): LIPASE, AMYLASE in the last 168 hours. No results for input(s): AMMONIA in the last 168 hours. Diabetic: No results for input(s): HGBA1C in the last 72 hours. Recent Labs  Lab 08/05/20 0802 08/05/20 1222 08/05/20 1620 08/05/20 2109 08/06/20 0852  GLUCAP 187* 199* 176* 155* 178*   Cardiac Enzymes: Recent  Labs  Lab 08/04/20 0335  CKTOTAL 46   No results for input(s): PROBNP in the last 8760 hours. Coagulation Profile: Recent Labs  Lab 08/02/20 0041 08/02/20 0559  INR 1.1 1.1   Thyroid Function Tests: No results for input(s): TSH, T4TOTAL, FREET4, T3FREE, THYROIDAB in the last 72 hours. Lipid Profile: No results for input(s): CHOL, HDL, LDLCALC, TRIG, CHOLHDL, LDLDIRECT in the last 72 hours. Anemia Panel: No results for input(s): VITAMINB12, FOLATE, FERRITIN, TIBC, IRON, RETICCTPCT in the last 72 hours. Urine analysis:    Component Value Date/Time   COLORURINE AMBER (A) 08/02/2020 0540   APPEARANCEUR CLEAR 08/02/2020 0540   LABSPEC 1.020 08/02/2020 0540   PHURINE 5.0 08/02/2020 0540   GLUCOSEU 50 (A) 08/02/2020 0540   HGBUR NEGATIVE 08/02/2020 0540   BILIRUBINUR NEGATIVE 08/02/2020 0540   KETONESUR NEGATIVE 08/02/2020 0540   PROTEINUR >=300 (A) 08/02/2020 0540   UROBILINOGEN 0.2 11/29/2014 1231   NITRITE POSITIVE (A) 08/02/2020 0540   LEUKOCYTESUR NEGATIVE 08/02/2020 0540   Sepsis Labs: Invalid input(s): PROCALCITONIN, [place]  Microbiology: Recent Results (from the past 240 hour(s))  Culture, blood (routine x 2)     Status: None   Collection Time: 07/30/20  2:55 AM   Specimen: BLOOD LEFT ARM  Result Value Ref Range Status   Specimen Description BLOOD LEFT ARM  Final   Special Requests   Final    BOTTLES DRAWN AEROBIC AND ANAEROBIC Blood Culture adequate volume   Culture   Final    NO GROWTH 5 DAYS Performed at [facility], [address], [place], [place] [address]    Report Status 08/04/2020 FINAL  Final  Culture, blood (routine x 2)     Status: None   Collection Time: 07/30/20  2:58 AM   Specimen: BLOOD LEFT HAND  Result Value Ref Range Status   Specimen Description BLOOD LEFT HAND  Final   Special Requests   Final    BOTTLES DRAWN AEROBIC AND ANAEROBIC Blood Culture adequate volume   Culture   Final    NO GROWTH 5 DAYS Performed at [facility], [place] [address], [place], [place] [address]    Report Status 08/04/2020 FINAL  Final  Resp Panel by RT-PCR (Flu A&B, Covid) Nasopharyngeal Swab     Status: None   Collection Time: 07/30/20  3:05 AM   Specimen: Nasopharyngeal Swab; Nasopharyngeal(NP) swabs in vial transport medium  Result Value Ref Range Status   SARS Coronavirus 2 by RT PCR NEGATIVE NEGATIVE Final    Comment: (NOTE) SARS-CoV-2 target nucleic acids are NOT DETECTED.  The SARS-CoV-2 RNA is generally detectable in upper respiratory specimens during the acute phase of infection. The lowest concentration of SARS-CoV-2 viral copies this assay can detect is 138 copies/mL. A negative result does not preclude SARS-Cov-2 infection and should not be used as the sole basis for treatment or other patient management decisions. A negative result may occur with  improper specimen collection/handling, submission  of specimen other than nasopharyngeal swab, presence of viral mutation(s) within the areas targeted by this assay, and inadequate number of viral copies(<138 copies/mL). A negative result must be combined with clinical observations, patient history, and epidemiological information. The expected result is Negative.  Fact Sheet for Patients:  EntrepreneurPulse.com.au  Fact Sheet for Healthcare Providers:  IncredibleEmployment.be  This test is no t yet approved or cleared by the [place] FDA and  has been authorized for detection and/or diagnosis of SARS-CoV-2 by FDA under an Emergency Use Authorization (EUA). This EUA will remain  in effect (meaning this test can be used) for the duration of the COVID-19 declaration under Section 564(b)(1) of the Act, 21 U.S.C.section 360bbb-3(b)(1), unless the authorization is terminated  or revoked sooner.       Influenza A by PCR NEGATIVE NEGATIVE Final   Influenza B by PCR NEGATIVE NEGATIVE Final    Comment: (NOTE) The Xpert Xpress  SARS-CoV-2/FLU/RSV plus assay is intended as an aid in the diagnosis of influenza from Nasopharyngeal swab specimens and should not be used as a sole basis for treatment. Nasal washings and aspirates are unacceptable for Xpert Xpress SARS-CoV-2/FLU/RSV testing.  Fact Sheet for Patients: EntrepreneurPulse.com.au  Fact Sheet for Healthcare Providers: IncredibleEmployment.be  This test is not yet approved or cleared by the [place] FDA and has been authorized for detection and/or diagnosis of SARS-CoV-2 by FDA under an Emergency Use Authorization (EUA). This EUA will remain in effect (meaning this test can be used) for the duration of the COVID-19 declaration under Section 564(b)(1) of the Act, 21 U.S.C. section 360bbb-3(b)(1), unless the authorization is terminated or revoked.  Performed at [facility], [place] [address], [place], [place] [address]   Urine culture     Status: Abnormal   Collection Time: 07/30/20 11:42 AM   Specimen: Urine, Clean Catch  Result Value Ref Range Status   Specimen Description URINE, CLEAN CATCH  Final   Special Requests NONE  Final   Culture (A)  Final    <10,000 COLONIES/mL INSIGNIFICANT GROWTH Performed at [facility], [place] [address] [place], [place] [address]    Report Status 07/31/2020 FINAL  Final  Blood Culture (routine x 2)     Status: None (Preliminary result)   Collection Time: 08/02/20  1:00 AM   Specimen: BLOOD  Result Value Ref Range Status   Specimen Description   Final    BLOOD BLOOD LEFT FOREARM Performed at [place] [address], [place], [place] [address]    Special Requests   Final    BOTTLES DRAWN AEROBIC AND ANAEROBIC Blood Culture results may not be optimal due to an inadequate volume of blood received in culture bottles Performed at [place] [address] [place], [place] [address]    Culture   Final    NO GROWTH 4  DAYS Performed at [facility], [place] [address] [place], [place] [address]    Report Status PENDING  Incomplete  Blood Culture (routine x 2)     Status: None (Preliminary result)   Collection Time: 08/02/20  1:00 AM   Specimen: BLOOD  Result Value Ref Range Status   Specimen Description   Final    BLOOD BLOOD RIGHT FOREARM Performed at [place] [address], [place], [place] [address]    Special Requests   Final    BOTTLES DRAWN AEROBIC AND ANAEROBIC Blood Culture adequate volume Performed at [facility], [place] [address], [place], [place] [address]    Culture   Final    NO GROWTH 4 DAYS Performed at [facility], [place] [address] [place], [place] [address]    Report Status PENDING  Incomplete  Resp Panel by RT-PCR (Flu A&B, Covid) Nasopharyngeal Swab     Status: None   Collection Time: 08/02/20  2:32 AM   Specimen: Nasopharyngeal Swab; Nasopharyngeal(NP) swabs in vial transport medium  Result Value Ref Range Status   SARS Coronavirus 2 by RT PCR NEGATIVE NEGATIVE Final    Comment: (NOTE) SARS-CoV-2 target nucleic acids are NOT DETECTED.  The SARS-CoV-2 RNA is generally detectable in upper respiratory specimens during the acute phase of infection. The lowest concentration of SARS-CoV-2 viral copies this assay can detect is 138 copies/mL. A negative result does not preclude SARS-Cov-2 infection and should not be used as the sole basis for treatment or other patient management decisions. A negative result may occur with  improper specimen collection/handling, submission of specimen other than nasopharyngeal swab, presence of viral mutation(s) within the areas targeted by this assay, and inadequate number of viral copies(<138 copies/mL). A negative result must be combined with clinical observations, patient history, and epidemiological information. The expected result is Negative.  Fact Sheet for Patients:   EntrepreneurPulse.com.au  Fact Sheet for Healthcare Providers:  IncredibleEmployment.be  This test is no t yet approved or cleared by the [place] FDA and  has been authorized for detection and/or diagnosis of SARS-CoV-2 by FDA under an Emergency Use Authorization (EUA). This EUA will remain  in effect (meaning this test can be used) for the duration of the COVID-19 declaration under Section 564(b)(1) of the Act, 21 U.S.C.section 360bbb-3(b)(1), unless the authorization is terminated  or revoked sooner.       Influenza A by PCR NEGATIVE NEGATIVE Final   Influenza B by PCR NEGATIVE NEGATIVE Final    Comment: (NOTE) The Xpert Xpress SARS-CoV-2/FLU/RSV plus assay is intended as an aid in the diagnosis of influenza from Nasopharyngeal swab specimens and should not be used as a sole basis for treatment. Nasal washings and aspirates are unacceptable for Xpert Xpress SARS-CoV-2/FLU/RSV testing.  Fact Sheet for Patients: EntrepreneurPulse.com.au  Fact Sheet for Healthcare Providers: IncredibleEmployment.be  This test is not yet approved or cleared by the [place] FDA and has been authorized for detection and/or diagnosis of SARS-CoV-2 by FDA under an Emergency Use Authorization (EUA). This EUA will remain in effect (meaning this test can be used) for the duration of the COVID-19 declaration under Section 564(b)(1) of the Act, 21 U.S.C. section 360bbb-3(b)(1), unless the authorization is terminated or revoked.  Performed at [facility], [place] [address] [place], [place] [address]   Urine culture     Status: None   Collection Time: 08/02/20  5:40 AM   Specimen: In/Out Cath Urine  Result Value Ref Range Status   Specimen Description   Final    IN/OUT CATH URINE Performed at [place] [address], [place],  [address]    Special Requests   Final     NONE Performed at [facility], [place] [address] [place],  [address]    Culture   Final    NO GROWTH Performed at [facility], [place] [address], [place],  [address]    Report Status 08/03/2020 FINAL  Final  MRSA PCR Screening     Status: None   Collection Time: 08/02/20  9:05 AM   Specimen: Nasopharyngeal  Result Value Ref Range Status   MRSA by PCR NEGATIVE NEGATIVE Final    Comment:        The GeneXpert MRSA Assay (FDA approved for NASAL specimens only), is one component of a comprehensive MRSA colonization surveillance program. It is not intended to diagnose MRSA infection nor to guide or monitor treatment for MRSA infections. Performed at [facility], [place] [address] [place], [place] [address]   Aerobic/Anaerobic Culture w Gram Stain (surgical/deep wound)     Status: None (Preliminary result)   Collection Time: 08/04/20  5:17 PM   Specimen: Wound  Result Value Ref Range Status   Specimen Description ABSCESS LEFT FOOT  Final   Special Requests NONE  Final   Gram Stain   Final    RARE WBC PRESENT,BOTH PMN AND MONONUCLEAR RARE GRAM POSITIVE COCCI IN PAIRS    Culture   Final    NO GROWTH 2 DAYS Performed at [place] [facility], [place] [address], [place], [place] [address]    Report Status PENDING  Incomplete    Radiology Studies: VAS [place] ABI WITH/WO TBI  Result Date: 08/06/2020  LOWER EXTREMITY DOPPLER STUDY Patient Name:  Shirley Chen  Date of Exam:   08/06/2020 Medical Rec #: [id number]       Accession #:    [id number] Date of Birth: 1955/08/25       Patient Gender: F Patient Age:   64Y Exam Location:  [facility] Procedure:      VAS [place] ABI WITH/WO TBI Referring Phys: [id number] KEVIN P PATEL --------------------------------------------------------------------------------  Indications: Peripheral artery disease, and Right great toe amputation. High Risk Factors: Hypertension, hyperlipidemia, current smoker. Other  Factors: 08/06/2020 - IRRIGATION AND DEBRIDEMENT LEFT FOOT WOUND.  Limitations: Today's exam was limited due to patient intolerant to cuff              pressure, an open wound and bandages. Comparison Study: No prior studies. Performing Technologist: Carlos Levering RVT  Examination Guidelines: A complete evaluation includes at minimum, Doppler waveform signals and systolic blood pressure reading at the level of bilateral brachial, anterior tibial, and posterior tibial arteries, when vessel segments are accessible. Bilateral testing is considered an integral part of a complete examination. Photoelectric Plethysmograph (PPG) waveforms and toe systolic pressure readings are included as required and additional duplex testing as needed. Limited examinations for reoccurring indications may be performed as noted.  ABI Findings: +--------+------------------+-----+---------+--------+ Right   Rt Pressure (mmHg)IndexWaveform Comment  +--------+------------------+-----+---------+--------+ [id number]                    triphasic         +--------+------------------+-----+---------+--------+ PTA     155               0.82 biphasic          +--------+------------------+-----+---------+--------+ DP      167               0.88 triphasic         +--------+------------------+-----+---------+--------+ +--------+------------------+-----+----------+-------+ Left    Lt Pressure (mmHg)IndexWaveform  Comment +--------+------------------+-----+----------+-------+ [id number]                    triphasic         +--------+------------------+-----+----------+-------+ PTA     123               0.65 monophasic        +--------+------------------+-----+----------+-------+ DP  111               0.59 monophasic        +--------+------------------+-----+----------+-------+ +-------+-----------+-----------+------------+------------+ ABI/TBIToday's ABIToday's TBIPrevious ABIPrevious TBI  +-------+-----------+-----------+------------+------------+ Right  1.04                                           +-------+-----------+-----------+------------+------------+ Left   0.65                                           +-------+-----------+-----------+------------+------------+  Summary: Right: Resting right ankle-brachial index is within normal range. No evidence of significant right lower extremity arterial disease. 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 Left: Resting left ankle-brachial index indicates moderate left lower extremity arterial disease. Unable to obtain TBI due to bandages and open wound.  *See table(s) above for measurements and observations.     Preliminary      Zaven Klemens T. [place]  If 7PM-7AM, please contact night-coverage [email or web address] 08/06/2020, 1:31 PM

## 2020-08-06 NOTE — Progress Notes (Signed)
Assisted Dr. Heather Roberts with Left Popliteal  block. Side rails up, monitors on throughout procedure. See vital signs in flow sheet. Tolerated Procedure well.

## 2020-08-06 NOTE — Interval H&P Note (Signed)
History and Physical Interval Note:  08/06/2020 10:22 AM  Shirley Chen  has presented today for surgery, with the diagnosis of LEFT FOOT ABSCESS.  The various methods of treatment have been discussed with the patient and family. After consideration of risks, benefits and other options for treatment, the patient has consented to  Procedure(s): IRRIGATION AND DEBRIDEMENT LEFT FOOT WOUND (Left) as a surgical intervention.  The patient's history has been reviewed, patient examined, no change in status, stable for surgery.  I have reviewed the patient's chart and labs.  Questions were answered to the patient's satisfaction.     Candelaria Stagers

## 2020-08-06 NOTE — Anesthesia Procedure Notes (Signed)
Anesthesia Regional Block: Popliteal block   Pre-Anesthetic Checklist: ,, timeout performed, Correct Patient, Correct Site, Correct Laterality, Correct Procedure, Correct Position, site marked, Risks and benefits discussed,  Surgical consent,  Pre-op evaluation,  At surgeon's request and post-op pain management  Laterality: Left  Prep: chloraprep       Needles:  Injection technique: Single-shot  Needle Type: Echogenic Stimulator Needle          Additional Needles:   Narrative:  Start time: 08/06/2020 10:21 AM End time: 08/06/2020 10:31 AM Injection made incrementally with aspirations every 5 mL.  Performed by: Personally  Anesthesiologist: Heather Roberts, MD  Additional Notes: A functioning IV was confirmed and monitors were applied.  Sterile prep and drape, hand hygiene and sterile gloves were used.  Negative aspiration and test dose prior to incremental administration of local anesthetic. The patient tolerated the procedure well.Ultrasound  guidance: relevant anatomy identified, needle position confirmed, local anesthetic spread visualized around nerve(s), vascular puncture avoided.  Image printed for medical record.

## 2020-08-06 NOTE — Progress Notes (Signed)
Improving  Discussed with Dr. Alanda Slim, PCCM will sign off Call as needed

## 2020-08-06 NOTE — Anesthesia Procedure Notes (Signed)
Procedure Name: LMA Insertion Performed by: Zamia Tyminski A, CRNA Pre-anesthesia Checklist: Patient identified, Emergency Drugs available, Suction available and Patient being monitored Patient Re-evaluated:Patient Re-evaluated prior to induction Oxygen Delivery Method: Circle system utilized Preoxygenation: Pre-oxygenation with 100% oxygen Induction Type: IV induction LMA: LMA inserted LMA Size: 4.0 Tube type: Oral Number of attempts: 1 Placement Confirmation: positive ETCO2 and breath sounds checked- equal and bilateral Tube secured with: Tape Dental Injury: Teeth and Oropharynx as per pre-operative assessment        

## 2020-08-07 ENCOUNTER — Encounter (HOSPITAL_COMMUNITY): Payer: Self-pay | Admitting: Podiatry

## 2020-08-07 DIAGNOSIS — I70229 Atherosclerosis of native arteries of extremities with rest pain, unspecified extremity: Secondary | ICD-10-CM

## 2020-08-07 DIAGNOSIS — I739 Peripheral vascular disease, unspecified: Secondary | ICD-10-CM

## 2020-08-07 DIAGNOSIS — J9601 Acute respiratory failure with hypoxia: Secondary | ICD-10-CM | POA: Diagnosis not present

## 2020-08-07 DIAGNOSIS — L089 Local infection of the skin and subcutaneous tissue, unspecified: Secondary | ICD-10-CM | POA: Diagnosis not present

## 2020-08-07 DIAGNOSIS — E11628 Type 2 diabetes mellitus with other skin complications: Secondary | ICD-10-CM | POA: Diagnosis not present

## 2020-08-07 DIAGNOSIS — N179 Acute kidney failure, unspecified: Secondary | ICD-10-CM | POA: Diagnosis not present

## 2020-08-07 DIAGNOSIS — E1151 Type 2 diabetes mellitus with diabetic peripheral angiopathy without gangrene: Secondary | ICD-10-CM | POA: Diagnosis not present

## 2020-08-07 DIAGNOSIS — M726 Necrotizing fasciitis: Secondary | ICD-10-CM | POA: Diagnosis not present

## 2020-08-07 DIAGNOSIS — I151 Hypertension secondary to other renal disorders: Secondary | ICD-10-CM | POA: Diagnosis not present

## 2020-08-07 LAB — CBC WITH DIFFERENTIAL/PLATELET
Abs Immature Granulocytes: 1.34 10*3/uL — ABNORMAL HIGH (ref 0.00–0.07)
Basophils Absolute: 0.1 10*3/uL (ref 0.0–0.1)
Basophils Relative: 1 %
Eosinophils Absolute: 0.1 10*3/uL (ref 0.0–0.5)
Eosinophils Relative: 0 %
HCT: 35.1 % — ABNORMAL LOW (ref 36.0–46.0)
Hemoglobin: 11.1 g/dL — ABNORMAL LOW (ref 12.0–15.0)
Immature Granulocytes: 6 %
Lymphocytes Relative: 8 %
Lymphs Abs: 1.9 10*3/uL (ref 0.7–4.0)
MCH: 29.7 pg (ref 26.0–34.0)
MCHC: 31.6 g/dL (ref 30.0–36.0)
MCV: 93.9 fL (ref 80.0–100.0)
Monocytes Absolute: 2.1 10*3/uL — ABNORMAL HIGH (ref 0.1–1.0)
Monocytes Relative: 9 %
Neutro Abs: 17.8 10*3/uL — ABNORMAL HIGH (ref 1.7–7.7)
Neutrophils Relative %: 76 %
Platelets: 400 10*3/uL (ref 150–400)
RBC: 3.74 MIL/uL — ABNORMAL LOW (ref 3.87–5.11)
RDW: 14 % (ref 11.5–15.5)
WBC: 23.3 10*3/uL — ABNORMAL HIGH (ref 4.0–10.5)
nRBC: 0 % (ref 0.0–0.2)

## 2020-08-07 LAB — CULTURE, BLOOD (ROUTINE X 2)
Culture: NO GROWTH
Culture: NO GROWTH
Special Requests: ADEQUATE

## 2020-08-07 LAB — RENAL FUNCTION PANEL
Albumin: 2.3 g/dL — ABNORMAL LOW (ref 3.5–5.0)
Anion gap: 10 (ref 5–15)
BUN: 40 mg/dL — ABNORMAL HIGH (ref 8–23)
CO2: 28 mmol/L (ref 22–32)
Calcium: 8.3 mg/dL — ABNORMAL LOW (ref 8.9–10.3)
Chloride: 106 mmol/L (ref 98–111)
Creatinine, Ser: 1.3 mg/dL — ABNORMAL HIGH (ref 0.44–1.00)
GFR, Estimated: 46 mL/min — ABNORMAL LOW (ref >60)
Glucose, Bld: 265 mg/dL — ABNORMAL HIGH (ref 70–99)
Phosphorus: 3 mg/dL (ref 2.5–4.6)
Potassium: 2.8 mmol/L — ABNORMAL LOW (ref 3.5–5.1)
Sodium: 144 mmol/L (ref 135–145)

## 2020-08-07 LAB — MAGNESIUM: Magnesium: 1.7 mg/dL (ref 1.7–2.4)

## 2020-08-07 LAB — BRAIN NATRIURETIC PEPTIDE: B Natriuretic Peptide: 596.2 pg/mL — ABNORMAL HIGH (ref 0.0–100.0)

## 2020-08-07 LAB — GLUCOSE, CAPILLARY
Glucose-Capillary: 222 mg/dL — ABNORMAL HIGH (ref 70–99)
Glucose-Capillary: 232 mg/dL — ABNORMAL HIGH (ref 70–99)
Glucose-Capillary: 277 mg/dL — ABNORMAL HIGH (ref 70–99)
Glucose-Capillary: 257 mg/dL — ABNORMAL HIGH (ref 70–99)

## 2020-08-07 LAB — CK: Total CK: 22 U/L — ABNORMAL LOW (ref 38–234)

## 2020-08-07 MED ORDER — POTASSIUM CHLORIDE 10 MEQ/100ML IV SOLN
10.0000 meq | INTRAVENOUS | Status: AC
  Administered 2020-08-07 (×4): 10 meq via INTRAVENOUS
  Filled 2020-08-07 (×3): qty 100

## 2020-08-07 MED ORDER — POTASSIUM CHLORIDE CRYS ER 20 MEQ PO TBCR
40.0000 meq | EXTENDED_RELEASE_TABLET | ORAL | Status: AC
  Administered 2020-08-07 (×2): 40 meq via ORAL
  Filled 2020-08-07 (×2): qty 2

## 2020-08-07 MED ORDER — MAGNESIUM SULFATE 2 GM/50ML IV SOLN
2.0000 g | Freq: Once | INTRAVENOUS | Status: AC
  Administered 2020-08-07: 2 g via INTRAVENOUS
  Filled 2020-08-07: qty 50

## 2020-08-07 NOTE — Progress Notes (Signed)
OT Cancellation Note  Patient Details Name: Shirley Chen MRN: [id number] DOB: [birth year]   Cancelled Treatment:    Reason Eval/Treat Not Completed: Other (comment) Per vascular note patient to be transferred to [facility] today for possible amputation. Will defer OT at this time.  Marlyce Huge OT OT pager: [phone]   Carmelia Roller 08/07/2020, 7:00 AM

## 2020-08-07 NOTE — Progress Notes (Signed)
Pharmacy Antibiotic Note  Shirley Chen is a 65 y.o. female admitted on 08/01/2020 with sepsis.  Pharmacy has been consulted for zyvox/cefepime dosing for necrotizing fasciitis/possible osteomyelitis. Also on Flagyl per Md. Patient just underwent I&D per podiatry on 5/2 now per vascular surgery possibility of amputation  D6 cefepime/flagyl and D4 Zyvox WBC 23.3 up SCr 1.3 improved, est CrCl 51 Afebrile Wound cultures still pending For Zyvox monitoring, Hgb and Plts still stable  Plan:  Continue Zyvox 600 mg IV q12  Continue Cefepime 2g IV q12 per current renal function  Continue on Flagyl 500 mg IV q8h per MD  Further plans per ID and podiatry/vascular surgery  Height: 5\' 3"  (160 cm) Weight: 102.2 kg (225 lb 5 oz) IBW/kg (Calculated) : 52.4  Temp (24hrs), Avg:98.2 F (36.8 C), Min:97.5 F (36.4 C), Max:98.7 F (37.1 C)  Recent Labs  Lab 08/01/20 0041 08/02/20 0041 08/02/20 0230 08/02/20 0559 08/03/20 0411 08/04/20 0335 08/04/20 08/06/20 08/04/20 0620 08/05/20 0231 08/05/20 0312 08/06/20 0244 08/07/20 0236  WBC  --    < >  --    < > 20.7* 18.8*  --   --  25.8*  --  21.9* 23.3*  CREATININE 2.13*  --   --    < > 2.15* 1.90*  --   --  1.66*  --  1.42* 1.30*  LATICACIDVEN 1.3  --  0.9  --   --   --  0.7 0.6  --  0.9  --   --    < > = values in this interval not displayed.    Estimated Creatinine Clearance: 50.6 mL/min (A) (by C-G formula based on SCr of 1.3 mg/dL (H)).    Allergies  Allergen Reactions  . Losartan Nausea And Vomiting  . Morphine And Related     Pt states " I just got really sick"   Antimicrobials this admission:  4/28 Cefepime>>   4/28 Vanc x 1 4/28 Flagyl >> 4/29 azith >> (5/3) 4/30 zyvox>>  Dose adjustments this admission:   Microbiology results:  4/28 BCx: ngtd 4/28 UCx: ngF 4/28 MRSA neg 4/30 Left food abscess: ngtd 5/2 left food wound: pending  Thank you for allowing pharmacy to be a part of this patient's care.  5/30,  PharmD, BCPS Secure Chat if ?s 08/07/2020 9:22 AM

## 2020-08-07 NOTE — Progress Notes (Signed)
Pt still at [facility].  Will reevaluate her after her transfer to [facility].  Fabienne Bruns, MD [facility] Office: [phone]

## 2020-08-07 NOTE — Progress Notes (Addendum)
Son in Social worker, Dorma Russell,  called and updated on pts transfer.  All pts belongings placed in pt belongings bag (cell phone, clothes, phone charger, glasses) and given to patient to go with [facility] to [facility].

## 2020-08-07 NOTE — Progress Notes (Signed)
PROGRESS NOTE  Shirley Chen YFV:[id number] DOB: Jul 15, 1955   PCP: Marrian Salvage, FNP  Patient is from: Home  DOA: 08/01/2020 LOS: 5  Chief complaints: Shortness of breath  Brief Narrative / Interim history: 65 year old F with PMH of DM-2, HTN, CKD-3, PVD, HLD and tobacco abuse presenting with progressive shortness of breath, lower extremity edema and nausea and admitted with SIRS, acute respiratory failure with hypoxia and elevated D-dimer.  She was empirically started on broad-spectrum antibiotics and IV heparin pending cultures, VQ scan and LE [place].  CT chest without contrast without acute finding.  She also had mildly elevated troponin and BNP.   VQ scan with low probability for central PE.  LE [place] negative for DVT.  Heparin discontinued.  However, she continued to have significant O2 requirement likely due to pneumonia and CHF exacerbation.  Started on IV Lasix in addition to broad-spectrum antibiotics.  Respiratory status improving.  Patient was noted to have left foot swelling and erythema on 4/30.  MRI foot with severe soft edema throughout the foot with area and dorsal aspect of the forefoot tracking proximally to the level of the TMT joint most concerning for necrotizing infection and possible reactive marrow edema vs osteomyelitis within the third proximal phalanx.  ID and podiatry consulted.  Linezolid added.  Patient underwent irrigation and debridement with incision and drainage by podiatry, Dr. Posey Pronto on 4/30 and 5/2.   Left lower extremity ABI consistent with PAD.  Vascular surgery recommended transfer to [facility] for evaluation and treatment  Subjective: Seen and examined earlier this morning.  No major events overnight of this morning.  No complaints.  She denies chest pain, shortness of breath, GI or UTI symptoms.  Surgical site pain fairly controlled.  She is awake and alert.  Oriented to self, place, year, person and situation.  Objective: Vitals:   08/07/20 0500  08/07/20 0700 08/07/20 0800 08/07/20 1000  BP: (!) 160/55 (!) 188/62 (!) 165/87 (!) 174/63  Pulse: 67  66 66  Resp: _0 Temp:   98 F (36.7 C)   TempSrc:   Oral   SpO2: 95%  95%   Weight:      Height:        Intake/Output Summary (Last 24 hours) at 08/07/2020 1018 Last data filed at 08/07/2020 0920 Gross per 24 hour  Intake 1102.26 ml  Output 3352 ml  Net -2249.74 ml   Filed Weights   08/05/20 0518 08/06/20 0556 08/07/20 0408  Weight: 108.6 kg 105.5 kg 102.2 kg    Examination:  GENERAL: No apparent distress.  Nontoxic. HEENT: MMM.  Vision and hearing grossly intact.  NECK: Supple.  No apparent JVD.  RESP: 96% on 4 L.  No IWOB.  Diminished aeration partly due to poor respiratory effort CVS:  RRR. Heart sounds normal.  ABD/GI/GU: BS+. Abd soft, NTND.  MSK/EXT:  Moves extremities. No apparent deformity. No edema.  SKIN: no apparent skin lesion or wound NEURO: Awake.  Oriented to self, place, year and situation.  No apparent focal neuro deficit. PSYCH: Calm. Normal affect.   Procedures:  08/04/2020-irrigation and debridement with incision and drainage of left foot infection. 08/06/2020-irrigation and debridement with incision and drainage of left foot infection  Microbiology summarized: COVID-19 and influenza PCR nonreactive. MRSA PCR negative. Blood and urine cultures NGTD Abscess culture NGTD.  Assessment & Plan: Acute respiratory failure with hypoxia: Multifactorial including CAP, severe sepsis and acute CHF. Patient is not on oxygen at baseline.  Initial  CT chest and CXR without significant finding.  D-dimer elevated but VQ scan and LE [place] negative.  BNP 500>596.  Repeat CXR with bibasilar and right lung opacity.  Started on diuretics in addition to antibiotics.  Respiratory failure improved. -On broad-spectrum antibiotics mainly for foot infection. -Complete azithromycin for 5 days -Continue IV Lasix 40 mg twice daily -Wean oxygen as able-minimum oxygen to keep  saturation above 90%. -Incentive spirometry/OOB/PT/OT -Appreciate help by PCCM.  Severe sepsis due to  left foot cellulitis with abscess and possible community-acquired pneumonia POA: Has leukocytosis, tachycardia, tachypnea, AKI, respiratory failure and hypotension.  MRI left foot raised concern for necrotizing fasciitis although her leukocytosis for a week and elevated ESR argues against necrotizing fasciitis. -Status post I&D of left foot by podiatry, Dr. Posey Pronto on 4/30 and 5/2. -Nonweightbearing on left foot. -Currently on IV cefepime, Flagyl and linezolid -ID, podiatry and vascular surgery following.  Left lower extremity PAD: ABI 0.65 with monophasic waveforms.  Per podiatry, discoloration of her toes raising concern for critical limb -Vascular surgery recommended transfer to [facility] for further evaluation and intervention -Patient is waiting on bed.   Acute diastolic CHF: TTE with LVEF of 60 to 65%, G2-DD.  BNP elevated to 500.  CXR with bibasilar and right lung opacities.  Started on IV Lasix.  Had 2.8 L UOP/24 hours.  Renal function and respiratory status improved. -Continue IV Lasix 40 mg twice daily -Continue Aldactone 25 mg daily for hypernatremia and hypokalemia. -Monitor fluid status, renal functions and electrolytes   AKI on CKD-3B/azotemia: Baseline Cr 1.1-1.2.  AKI likely in the setting of possible sepsis and nephrotoxic meds (NSAIDs and ACE inhibitor's). FENa 1.6% suggesting intrinsic process.  UPC 0.49.  Renal US without significant finding.  Improving Recent Labs    02/02/20 0811 06/21/20 0835 07/29/20 2255 08/01/20 0041 08/02/20 0559 08/03/20 0411 08/04/20 0335 08/05/20 0231 08/06/20 0244 08/07/20 0236  BUN 23 20 25* 46* 46* 56* 64* 56* 48* 40*  CREATININE 1.34* 1.19 1.54* 2.13* 2.27* 2.15* 1.90* 1.66* 1.42* 1.30*  -Recheck renal function in the morning -Continue holding ACE inhibitor's.  Acute metabolic encephalopathy: Could be from sepsis or iatrogenic  from pain meds.  ABG reassuring.  Awake and oriented x4 except date of month.  -Treat treatable causes as above -Avoid or minimize sedating medications -Reorientation and delirium precautions -Upgraded to dysphagia 3 diet by dietitian.  Elevated troponin: Likely demand ischemia in the setting of the above.  Hypernatremia: Na 147>>>>144. -Aldactone as above  Hypokalemia/hypomagnesemia: K2.8.  Mg 1.7.  Likely due to diuretics. -IV KCl 10 mill equivalent x4 ordered earlier this morning -Added p.o. K-Dur 40 mEq x 2 -IV magnesium sulfate 2 g x 1 -Continue p.o. Aldactone -Recheck in the morning.  Uncontrolled IDDM-2 with hyperglycemia, CKD and peripheral neuropathy: A1c 8.0% on 06/21/2020.  On Humalog 75/25 at 30 units twice daily, metformin Recent Labs  Lab 08/05/20 2109 08/06/20 0852 08/06/20 1621 08/06/20 2141 08/07/20 0756  GLUCAP 155* 178* 182* 254* 222*  -Continue SSI-moderate -Add mealtime coverage as 3 units 3 times daily -On Lantus 6 units twice daily -Continue atorvastatin, Zetia and gabapentin.  Proteinuria: UA with > 300 proteins.  UPC 0.5 arguing against nephrotic syndrome. -Resume ACE inhibitor's once renal function improves  Leukocytosis/bandemia: WBC 25>>18>25> 23 -Antibiotics as above -Continue monitoring  Hyperbilirubinemia: Resolved. -Recheck in the morning  Hypotension: Likely due to sepsis.  Resolved.  Hypertension: BP elevated -Continue home Coreg and amlodipine -Lasix and Aldactone as above -As needed labetalol -  Continue holding lisinopril in the setting of AKI  Normocytic anemia: Drop in Hgb likely dilutional.  She denies melena or hematochezia.  Transfused 1 units on 4/30 with appropriate response. Recent Labs    06/21/20 0835 07/29/20 2255 08/02/20 0041 08/02/20 0559 08/03/20 0411 08/04/20 0335 08/04/20 2007 08/05/20 0231 08/06/20 0244 08/07/20 0236  HGB 11.5* 10.2* 8.2* 8.4* 7.7* 7.9* 9.1* 9.5* 10.1* 11.1*  -Continue  monitoring -Monitor daily  Tobacco use disorder: Smokes about half a pack a day. -Encourage tobacco cessation -She is not interested in nicotine patch.  Chronic back pain-recent MRI of T and L-spine with moderate sized rightward disc extrusion at L4-5 with severe lateral recess stenosis and moderate multifactorial bilateral L4 neuroforaminal stenosis.  No red flags -Scheduled Tylenol with as needed tramadol -Outpatient follow-up -PT/OT eval   Hyperuricemia: Due to renal failure?  Gout? -Continue colchicine  Morbid obesity Body mass index is 39.91 kg/m.         DVT prophylaxis:  On subcu heparin  Code Status: Full code Family Communication: Updated Mr. Ronnald Ramp, patient's son-in-law with patient's permission Level of care: Progressive.  Transfer to [facility] for vascular surgery evaluation and intervention Status is: Inpatient  Remains inpatient appropriate because:Persistent severe electrolyte disturbances, Altered mental status, Ongoing diagnostic testing needed not appropriate for outpatient work up, IV treatments appropriate due to intensity of illness or inability to take PO and Inpatient level of care appropriate due to severity of illness   Dispo: The patient is from: Home              Anticipated d/c is to: Home              Patient currently is not medically stable to d/c.   Difficult to place patient No       Consultants:  PCCM-signed off Infectious disease Podiatery Vascular surgery   Sch Meds:  Scheduled Meds: . sodium chloride   Intravenous Once  . amLODipine  10 mg Oral Daily  . atorvastatin  40 mg Oral Daily  . carvedilol  25 mg Oral BID WC  . Chlorhexidine Gluconate Cloth  6 each Topical Daily  . colchicine  0.6 mg Oral Daily  . ezetimibe  10 mg Oral Daily  . fentaNYL  50-100 mcg Intravenous UD  . furosemide  40 mg Intravenous BID  . gabapentin  300 mg Oral BID  . heparin  5,000 Units Subcutaneous Q8H  . insulin aspart  0-15 Units  Subcutaneous TID WC  . insulin aspart  0-5 Units Subcutaneous QHS  . mouth rinse  15 mL Mouth Rinse BID  . potassium chloride  40 mEq Oral Q4H  . spironolactone  25 mg Oral Daily   Continuous Infusions: . ceFEPime (MAXIPIME) IV Stopped (08/07/20 0155)  . linezolid (ZYVOX) IV Stopped (08/06/20 2256)  . metronidazole Stopped (08/07/20 0344)  . potassium chloride 10 mEq (08/07/20 0919)   PRN Meds:.acetaminophen **OR** acetaminophen, labetalol, traMADol  Antimicrobials: Anti-infectives (From admission, onward)   Start     Dose/Rate Route Frequency Ordered Stop   08/06/20 1000  azithromycin (ZITHROMAX) tablet 250 mg        250 mg Oral Daily 08/05/20 1628 08/07/20 0927   08/04/20 1430  DAPTOmycin (CUBICIN) 600 mg in sodium chloride 0.9 % IVPB  Status:  Discontinued        8 mg/kg  75.2 kg (Adjusted) 124 mL/hr over 30 Minutes Intravenous Daily 08/04/20 1317 08/04/20 1324   08/04/20 1430  linezolid (ZYVOX) IVPB 600 mg  600 mg 300 mL/hr over 60 Minutes Intravenous Every 12 hours 08/04/20 1331     08/04/20 0300  vancomycin (VANCOREADY) IVPB 1250 mg/250 mL  Status:  Discontinued        1,250 mg 166.7 mL/hr over 90 Minutes Intravenous Every 48 hours 08/02/20 0508 08/02/20 1115   08/03/20 1300  azithromycin (ZITHROMAX) tablet 500 mg  Status:  Discontinued        500 mg Oral Daily 08/03/20 1203 08/05/20 1621   08/02/20 1900  metroNIDAZOLE (FLAGYL) IVPB 500 mg        500 mg 100 mL/hr over 60 Minutes Intravenous Every 8 hours 08/02/20 1059     08/02/20 1400  ceFEPIme (MAXIPIME) 2 g in sodium chloride 0.9 % 100 mL IVPB        2 g 200 mL/hr over 30 Minutes Intravenous Every 12 hours 08/02/20 0502     08/02/20 1100  metroNIDAZOLE (FLAGYL) IVPB 500 mg  Status:  Discontinued        500 mg 100 mL/hr over 60 Minutes Intravenous Every 8 hours 08/02/20 0500 08/02/20 1100   08/02/20 0515  ceFEPIme (MAXIPIME) 2 g in sodium chloride 0.9 % 100 mL IVPB  Status:  Discontinued        2 g 200 mL/hr  over 30 Minutes Intravenous  Once 08/02/20 0500 08/02/20 0502   08/02/20 0515  vancomycin (VANCOCIN) IVPB 1000 mg/200 mL premix  Status:  Discontinued        1,000 mg 200 mL/hr over 60 Minutes Intravenous  Once 08/02/20 0500 08/02/20 0503   08/02/20 0230  vancomycin (VANCOREADY) IVPB 2000 mg/400 mL        2,000 mg 200 mL/hr over 120 Minutes Intravenous STAT 08/02/20 0218 08/02/20 0441   08/02/20 0215  ceFEPIme (MAXIPIME) 2 g in sodium chloride 0.9 % 100 mL IVPB        2 g 200 mL/hr over 30 Minutes Intravenous  Once 08/02/20 0208 08/02/20 0250   08/02/20 0215  metroNIDAZOLE (FLAGYL) IVPB 500 mg        500 mg 100 mL/hr over 60 Minutes Intravenous  Once 08/02/20 0208 08/02/20 0343   08/02/20 0215  vancomycin (VANCOCIN) IVPB 1000 mg/200 mL premix  Status:  Discontinued        1,000 mg 200 mL/hr over 60 Minutes Intravenous  Once 08/02/20 0208 08/02/20 0218       I have personally reviewed the following labs and images: CBC: Recent Labs  Lab 08/02/20 0559 08/03/20 0411 08/04/20 0335 08/04/20 2007 08/05/20 0231 08/06/20 0244 08/07/20 0236  WBC 25.1* 20.7* 18.8*  --  25.8* 21.9* 23.3*  NEUTROABS 21.6*  --  16.1*  --  21.7* 17.0* 17.8*  HGB 8.4* 7.7* 7.9* 9.1* 9.5* 10.1* 11.1*  HCT 27.7* 25.7* 25.4* 28.9* 29.8* 31.3* 35.1*  MCV 97.2 97.0 96.9  --  95.2 92.6 93.9  PLT 289 280 302  --  360 398 400   BMP &GFR Recent Labs  Lab 08/03/20 0411 08/04/20 0335 08/05/20 0231 08/06/20 0244 08/07/20 0236  NA 142 145 142 146* 144  K 4.0 3.9 3.5 3.0* 2.8*  CL 112* 115* 113* 112* 106  CO2 21* 20* 20* 24 28  GLUCOSE 155* 126* 187* 192* 265*  BUN 56* 64* 56* 48* 40*  CREATININE 2.15* 1.90* 1.66* 1.42* 1.30*  CALCIUM 8.5* 8.8* 8.7* 8.7* 8.3*  MG 2.5* 2.3 2.3 2.0 1.7  PHOS 4.7* 4.2 3.5 2.5 3.0   Estimated Creatinine Clearance: 50.6 mL/min (A) (by  C-G formula based on SCr of 1.3 mg/dL (H)). Liver & Pancreas: Recent Labs  Lab 08/02/20 0559 08/03/20 0411 08/04/20 0335 08/05/20 0231  08/06/20 0244 08/07/20 0236  AST 17  --  16 14*  --   --   ALT 19  --  17 16  --   --   ALKPHOS 135*  --  131* 128*  --   --   BILITOT 1.4*  --  1.0 0.9  --   --   PROT 6.1*  --  6.0* 6.3*  --   --   ALBUMIN 2.7* 2.5* 2.4*  2.4* 2.4* 2.5* 2.3*   No results for input(s): LIPASE, AMYLASE in the last 168 hours. No results for input(s): AMMONIA in the last 168 hours. Diabetic: No results for input(s): HGBA1C in the last 72 hours. Recent Labs  Lab 08/05/20 2109 08/06/20 0852 08/06/20 1621 08/06/20 2141 08/07/20 0756  GLUCAP 155* 178* 182* 254* 222*   Cardiac Enzymes: Recent Labs  Lab 08/04/20 0335 08/07/20 0236  CKTOTAL 46 22*   No results for input(s): PROBNP in the last 8760 hours. Coagulation Profile: Recent Labs  Lab 08/02/20 0041 08/02/20 0559  INR 1.1 1.1   Thyroid Function Tests: No results for input(s): TSH, T4TOTAL, FREET4, T3FREE, THYROIDAB in the last 72 hours. Lipid Profile: No results for input(s): CHOL, HDL, LDLCALC, TRIG, CHOLHDL, LDLDIRECT in the last 72 hours. Anemia Panel: No results for input(s): VITAMINB12, FOLATE, FERRITIN, TIBC, IRON, RETICCTPCT in the last 72 hours. Urine analysis:    Component Value Date/Time   COLORURINE AMBER (A) 08/02/2020 0540   APPEARANCEUR CLEAR 08/02/2020 0540   LABSPEC 1.020 08/02/2020 0540   PHURINE 5.0 08/02/2020 0540   GLUCOSEU 50 (A) 08/02/2020 0540   HGBUR NEGATIVE 08/02/2020 0540   BILIRUBINUR NEGATIVE 08/02/2020 0540   KETONESUR NEGATIVE 08/02/2020 0540   PROTEINUR >=300 (A) 08/02/2020 0540   UROBILINOGEN 0.2 11/29/2014 1231   NITRITE POSITIVE (A) 08/02/2020 0540   LEUKOCYTESUR NEGATIVE 08/02/2020 0540   Sepsis Labs: Invalid input(s): PROCALCITONIN, [place]  Microbiology: Recent Results (from the past 240 hour(s))  Culture, blood (routine x 2)     Status: None   Collection Time: 07/30/20  2:55 AM   Specimen: BLOOD LEFT ARM  Result Value Ref Range Status   Specimen Description BLOOD LEFT ARM   Final   Special Requests   Final    BOTTLES DRAWN AEROBIC AND ANAEROBIC Blood Culture adequate volume   Culture   Final    NO GROWTH 5 DAYS Performed at [facility], [address], [place], [place] [address]    Report Status 08/04/2020 FINAL  Final  Culture, blood (routine x 2)     Status: None   Collection Time: 07/30/20  2:58 AM   Specimen: BLOOD LEFT HAND  Result Value Ref Range Status   Specimen Description BLOOD LEFT HAND  Final   Special Requests   Final    BOTTLES DRAWN AEROBIC AND ANAEROBIC Blood Culture adequate volume   Culture   Final    NO GROWTH 5 DAYS Performed at [facility], [place] [address] [place], [place] [address]    Report Status 08/04/2020 FINAL  Final  Resp Panel by RT-PCR (Flu A&B, Covid) Nasopharyngeal Swab     Status: None   Collection Time: 07/30/20  3:05 AM   Specimen: Nasopharyngeal Swab; Nasopharyngeal(NP) swabs in vial transport medium  Result Value Ref Range Status   SARS Coronavirus 2 by RT PCR  NEGATIVE NEGATIVE Final    Comment: (NOTE) SARS-CoV-2 target nucleic acids are NOT DETECTED.  The SARS-CoV-2 RNA is generally detectable in upper respiratory specimens during the acute phase of infection. The lowest concentration of SARS-CoV-2 viral copies this assay can detect is 138 copies/mL. A negative result does not preclude SARS-Cov-2 infection and should not be used as the sole basis for treatment or other patient management decisions. A negative result may occur with  improper specimen collection/handling, submission of specimen other than nasopharyngeal swab, presence of viral mutation(s) within the areas targeted by this assay, and inadequate number of viral copies(<138 copies/mL). A negative result must be combined with clinical observations, patient history, and epidemiological information. The expected result is Negative.  Fact Sheet for Patients:  EntrepreneurPulse.com.au  Fact Sheet for Healthcare  Providers:  IncredibleEmployment.be  This test is no t yet approved or cleared by the [place] FDA and  has been authorized for detection and/or diagnosis of SARS-CoV-2 by FDA under an Emergency Use Authorization (EUA). This EUA will remain  in effect (meaning this test can be used) for the duration of the COVID-19 declaration under Section 564(b)(1) of the Act, 21 U.S.C.section 360bbb-3(b)(1), unless the authorization is terminated  or revoked sooner.       Influenza A by PCR NEGATIVE NEGATIVE Final   Influenza B by PCR NEGATIVE NEGATIVE Final    Comment: (NOTE) The Xpert Xpress SARS-CoV-2/FLU/RSV plus assay is intended as an aid in the diagnosis of influenza from Nasopharyngeal swab specimens and should not be used as a sole basis for treatment. Nasal washings and aspirates are unacceptable for Xpert Xpress SARS-CoV-2/FLU/RSV testing.  Fact Sheet for Patients: EntrepreneurPulse.com.au  Fact Sheet for Healthcare Providers: IncredibleEmployment.be  This test is not yet approved or cleared by the [place] FDA and has been authorized for detection and/or diagnosis of SARS-CoV-2 by FDA under an Emergency Use Authorization (EUA). This EUA will remain in effect (meaning this test can be used) for the duration of the COVID-19 declaration under Section 564(b)(1) of the Act, 21 U.S.C. section 360bbb-3(b)(1), unless the authorization is terminated or revoked.  Performed at [facility], [place] [address], [place], [place] [address]   Urine culture     Status: Abnormal   Collection Time: 07/30/20 11:42 AM   Specimen: Urine, Clean Catch  Result Value Ref Range Status   Specimen Description URINE, CLEAN CATCH  Final   Special Requests NONE  Final   Culture (A)  Final    <10,000 COLONIES/mL INSIGNIFICANT GROWTH Performed at [facility], [place] [address], [place], [place] [address]    Report Status 07/31/2020  FINAL  Final  Blood Culture (routine x 2)     Status: None (Preliminary result)   Collection Time: 08/02/20  1:00 AM   Specimen: BLOOD  Result Value Ref Range Status   Specimen Description   Final    BLOOD BLOOD LEFT FOREARM Performed at [place] [address] [place], [place] [address]    Special Requests   Final    BOTTLES DRAWN AEROBIC AND ANAEROBIC Blood Culture results may not be optimal due to an inadequate volume of blood received in culture bottles Performed at [place] [address] [place], [place] [address]    Culture   Final    NO GROWTH 4 DAYS Performed at [facility], [place] [address], [place], [place] [address]    Report Status PENDING  Incomplete  Blood  Culture (routine x 2)     Status: None (Preliminary result)   Collection Time: 08/02/20  1:00 AM   Specimen: BLOOD  Result Value Ref Range Status   Specimen Description   Final    BLOOD BLOOD RIGHT FOREARM Performed at [place] [address], [place], [place] [address]    Special Requests   Final    BOTTLES DRAWN AEROBIC AND ANAEROBIC Blood Culture adequate volume Performed at [place] [address] [place], [place] [address]    Culture   Final    NO GROWTH 4 DAYS Performed at [facility], [place] [address] [place], [place] [address]    Report Status PENDING  Incomplete  Resp Panel by RT-PCR (Flu A&B, Covid) Nasopharyngeal Swab     Status: None   Collection Time: 08/02/20  2:32 AM   Specimen: Nasopharyngeal Swab; Nasopharyngeal(NP) swabs in vial transport medium  Result Value Ref Range Status   SARS Coronavirus 2 by RT PCR NEGATIVE NEGATIVE Final    Comment: (NOTE) SARS-CoV-2 target nucleic acids are NOT DETECTED.  The SARS-CoV-2 RNA is generally detectable in upper respiratory specimens during the acute phase of infection. The lowest concentration of SARS-CoV-2 viral copies this assay can detect  is 138 copies/mL. A negative result does not preclude SARS-Cov-2 infection and should not be used as the sole basis for treatment or other patient management decisions. A negative result may occur with  improper specimen collection/handling, submission of specimen other than nasopharyngeal swab, presence of viral mutation(s) within the areas targeted by this assay, and inadequate number of viral copies(<138 copies/mL). A negative result must be combined with clinical observations, patient history, and epidemiological information. The expected result is Negative.  Fact Sheet for Patients:  EntrepreneurPulse.com.au  Fact Sheet for Healthcare Providers:  IncredibleEmployment.be  This test is no t yet approved or cleared by the [place] FDA and  has been authorized for detection and/or diagnosis of SARS-CoV-2 by FDA under an Emergency Use Authorization (EUA). This EUA will remain  in effect (meaning this test can be used) for the duration of the COVID-19 declaration under Section 564(b)(1) of the Act, 21 U.S.C.section 360bbb-3(b)(1), unless the authorization is terminated  or revoked sooner.       Influenza A by PCR NEGATIVE NEGATIVE Final   Influenza B by PCR NEGATIVE NEGATIVE Final    Comment: (NOTE) The Xpert Xpress SARS-CoV-2/FLU/RSV plus assay is intended as an aid in the diagnosis of influenza from Nasopharyngeal swab specimens and should not be used as a sole basis for treatment. Nasal washings and aspirates are unacceptable for Xpert Xpress SARS-CoV-2/FLU/RSV testing.  Fact Sheet for Patients: EntrepreneurPulse.com.au  Fact Sheet for Healthcare Providers: IncredibleEmployment.be  This test is not yet approved or cleared by the [place] FDA and has been authorized for detection and/or diagnosis of SARS-CoV-2 by FDA under an Emergency Use Authorization (EUA). This EUA will remain in effect  (meaning this test can be used) for the duration of the COVID-19 declaration under Section 564(b)(1) of the Act, 21 U.S.C. section 360bbb-3(b)(1), unless the authorization is terminated or revoked.  Performed at [facility], [place] [address], [place], [place] [address]   Urine culture     Status: None   Collection Time: 08/02/20  5:40 AM   Specimen: In/Out Cath Urine  Result Value Ref Range Status   Specimen Description   Final    IN/OUT CATH URINE Performed at [facility],  [place] [address] [place], [place] [address]    Special Requests   Final    NONE Performed at [facility], [place] [address] [place], [place] [address]    Culture   Final    NO GROWTH Performed at [facility], [place] [address] [place], [place] [address]    Report Status 08/03/2020 FINAL  Final  MRSA PCR Screening     Status: None   Collection Time: 08/02/20  9:05 AM   Specimen: Nasopharyngeal  Result Value Ref Range Status   MRSA by PCR NEGATIVE NEGATIVE Final    Comment:        The GeneXpert MRSA Assay (FDA approved for NASAL specimens only), is one component of a comprehensive MRSA colonization surveillance program. It is not intended to diagnose MRSA infection nor to guide or monitor treatment for MRSA infections. Performed at [facility], [place] [address] [place], [place] [address]   Aerobic/Anaerobic Culture w Gram Stain (surgical/deep wound)     Status: None (Preliminary result)   Collection Time: 08/04/20  5:17 PM   Specimen: Wound  Result Value Ref Range Status   Specimen Description ABSCESS LEFT FOOT  Final   Special Requests NONE  Final   Gram Stain   Final    RARE WBC PRESENT,BOTH PMN AND MONONUCLEAR RARE GRAM POSITIVE COCCI IN PAIRS    Culture   Final    NO GROWTH 2 DAYS Performed at [facility], [place] [address], [place], [place] [address]    Report Status PENDING  Incomplete  Aerobic/Anaerobic  Culture w Gram Stain (surgical/deep wound)     Status: None (Preliminary result)   Collection Time: 08/06/20 11:57 AM   Specimen: Soft Tissue, Other  Result Value Ref Range Status   Specimen Description   Final    WOUND LT FOOT Performed at [place] [address], [place], [place] [address]    Special Requests   Final    NONE Performed at [facility], [place] [address] [place], [place] [address]    Gram Stain   Final    RARE WBC PRESENT, PREDOMINANTLY PMN NO ORGANISMS SEEN Performed at [facility], [place] [address] [place], [place] [address]    Culture PENDING  Incomplete   Report Status PENDING  Incomplete    Radiology Studies: No results found.   Tristin Vandeusen T. [place]  If 7PM-7AM, please contact night-coverage [email or web address] 08/07/2020, 10:18 AM

## 2020-08-07 NOTE — Progress Notes (Signed)
Report given to Trish RN at [facility] . All questions answered.

## 2020-08-07 NOTE — Progress Notes (Signed)
Brief podiatry note  Courtesy visit made to patient.  Patient is scheduled for a below the knee amputation of the left on tomorrow with Dr. Myra Gianotti secondary to progressive left foot infection with gangrene and significant tissue loss that did not improve after 2 incision and drainage and debridement procedures by Dr. Allena Katz. Patient thanked me for stopping by to visit and reports that she knows she needs the foot to go because of the very bad infection and wants me to thank Dr. Allena Katz for trying to save her foot.  Defer to vascular.  Podiatry to sign off at this time.  Dr. Marylene Land [facility] [phone] office [phone] cell Available via secure chat

## 2020-08-07 NOTE — Progress Notes (Signed)
[facility]  Date of Admission:  08/01/2020     CC: Diabetic foot infection  Abx: 4/30-c linezolid 4/29-c azith 4/27-c metronidazole 4/27-c cefepime  4/27-28 vanc                                          Assessment/plan Left diabetic foot infection Acute hypoxic respiratory failure; presumed pna aki improving Fluid overload  65 yo female dm2 admitted with severe sepsis in setting left diabetic foot infection. Mri suggest 3rd mt om vs bone marrow reaction. Course complicated by increased respiratory hypoxemic respiratory distress requiring transferring to ICU for monitoring on hd#3  #severe sepsis resolved #severe diabetic foot infection #severe peripheral vascular disease S/p I&D twice; wound cx not yet growing  Ongoing necrosis in the left foot Wbc still significantly elevated as of 5/03 Vascular surgery following, still unclear if she needs amputation 4/28 bcx ngtd   -f/u wound cx -continue linezolid, cefepime, metronidazole for now    #hypox resp distress presumed pneumonia #cxr R  Perihilar opacity #volume overload #disatolic heart failure Initial xray on admission without abnormality, but evolved to show perihilar opacity S/p CAP tx Respiratory distress resolved   I spent more than 35 minute reviewing data/chart, and coordinating care and >50% direct face to face time providing counseling/discussing diagnostics/treatment plan with patient    Principal Problem:   Acute respiratory failure with hypoxia (HCC) Active Problems:   Tobacco abuse   PAD (peripheral artery disease) (HCC)   HTN (hypertension)   Diabetes mellitus with peripheral vascular disease (HCC)   AKI (acute kidney injury) (HCC)   Necrotizing fasciitis (HCC)   Allergies  Allergen Reactions  . Losartan Nausea And Vomiting  . Morphine And Related     Pt states " I just got really sick"    Scheduled Meds: . sodium chloride    Intravenous Once  . amLODipine  10 mg Oral Daily  . atorvastatin  40 mg Oral Daily  . carvedilol  25 mg Oral BID WC  . Chlorhexidine Gluconate Cloth  6 each Topical Daily  . colchicine  0.6 mg Oral Daily  . ezetimibe  10 mg Oral Daily  . fentaNYL  50-100 mcg Intravenous UD  . furosemide  40 mg Intravenous BID  . gabapentin  300 mg Oral BID  . heparin  5,000 Units Subcutaneous Q8H  . insulin aspart  0-15 Units Subcutaneous TID WC  . insulin aspart  0-5 Units Subcutaneous QHS  . mouth rinse  15 mL Mouth Rinse BID  . spironolactone  25 mg Oral Daily   Continuous Infusions: . ceFEPime (MAXIPIME) IV Stopped (08/07/20 0155)  . linezolid (ZYVOX) IV Stopped (08/07/20 1140)  . metronidazole Stopped (08/07/20 1302)   PRN Meds:.acetaminophen **OR** acetaminophen, labetalol, traMADol   SUBJECTIVE: Improving pain left foot No n/v/diarrhea No other rash S/p I&D x2 by podiatry. Ongoing necrosis and high wbc Afebrile Poor vascular flow on abi  [facility] vs amputation   Review of Systems: ROS All other ROS was negative, except mentioned above     OBJECTIVE: Vitals:   08/07/20 0800 08/07/20 1000 08/07/20 1200 08/07/20 1416  BP: (!) 165/87 (!) 174/63 (!) 126/57 (!) 158/64  Pulse: 66 66 67 70  Resp: 12 12 18 19   Temp: 98 F (36.7 C)  97.6 F (36.4  C) 98.2 F (36.8 C)  TempSrc: Oral  Oral Oral  SpO2: 95%  98% 96%  Weight:      Height:       Body mass index is 39.91 kg/m.  Physical Exam       Skin/msk: erythema and moderate tenderness around left foot wound; the wound bed appears well debrided but extensive ongoing necrosis present  General/constitutional: more alert today and coherant, answering questions and following complex commands HEENT: Normocephalic, PER, Conj Clear, EOMI, Oropharynx clear Neck supple CV: rrr no mrg Lungs: clear to auscultation, normal respiratory effort Abd: Soft, Nontender Ext: no edema Neuro:  nonfocal   Lab Results Lab Results  Component Value Date   WBC 23.3 (H) 08/07/2020   HGB 11.1 (L) 08/07/2020   HCT 35.1 (L) 08/07/2020   MCV 93.9 08/07/2020   PLT 400 08/07/2020    Lab Results  Component Value Date   CREATININE 1.30 (H) 08/07/2020   BUN 40 (H) 08/07/2020   NA 144 08/07/2020   K 2.8 (L) 08/07/2020   CL 106 08/07/2020   CO2 28 08/07/2020    Lab Results  Component Value Date   ALT 16 08/05/2020   AST 14 (L) 08/05/2020   ALKPHOS 128 (H) 08/05/2020   BILITOT 0.9 08/05/2020      Microbiology: Recent Results (from the past 240 hour(s))  Culture, blood (routine x 2)     Status: None   Collection Time: 07/30/20  2:55 AM   Specimen: BLOOD LEFT ARM  Result Value Ref Range Status   Specimen Description BLOOD LEFT ARM  Final   Special Requests   Final    BOTTLES DRAWN AEROBIC AND ANAEROBIC Blood Culture adequate volume   Culture   Final    NO GROWTH 5 DAYS Performed at [facility], [address] [place], [place] [address]    Report Status 08/04/2020 FINAL  Final  Culture, blood (routine x 2)     Status: None   Collection Time: 07/30/20  2:58 AM   Specimen: BLOOD LEFT HAND  Result Value Ref Range Status   Specimen Description BLOOD LEFT HAND  Final   Special Requests   Final    BOTTLES DRAWN AEROBIC AND ANAEROBIC Blood Culture adequate volume   Culture   Final    NO GROWTH 5 DAYS Performed at [facility], [address] [place], [place] [address]    Report Status 08/04/2020 FINAL  Final  Resp Panel by RT-PCR (Flu A&B, Covid) Nasopharyngeal Swab     Status: None   Collection Time: 07/30/20  3:05 AM   Specimen: Nasopharyngeal Swab; Nasopharyngeal(NP) swabs in vial transport medium  Result Value Ref Range Status   SARS Coronavirus 2 by RT PCR NEGATIVE NEGATIVE Final    Comment: (NOTE) SARS-CoV-2 target nucleic acids are NOT DETECTED.  The SARS-CoV-2 RNA is generally detectable in upper respiratory specimens during the acute phase of  infection. The lowest concentration of SARS-CoV-2 viral copies this assay can detect is 138 copies/mL. A negative result does not preclude SARS-Cov-2 infection and should not be used as the sole basis for treatment or other patient management decisions. A negative result may occur with  improper specimen collection/handling, submission of specimen other than nasopharyngeal swab, presence of viral mutation(s) within the areas targeted by this assay, and inadequate number of viral copies(<138 copies/mL). A negative result must be combined with clinical observations, patient history, and epidemiological information. The expected result is Negative.  Fact Sheet  for Patients:  BloggerCourse.com  Fact Sheet for Healthcare Providers:  SeriousBroker.it  This test is no t yet approved or cleared by the [place] FDA and  has been authorized for detection and/or diagnosis of SARS-CoV-2 by FDA under an Emergency Use Authorization (EUA). This EUA will remain  in effect (meaning this test can be used) for the duration of the COVID-19 declaration under Section 564(b)(1) of the Act, 21 U.S.C.section 360bbb-3(b)(1), unless the authorization is terminated  or revoked sooner.       Influenza A by PCR NEGATIVE NEGATIVE Final   Influenza B by PCR NEGATIVE NEGATIVE Final    Comment: (NOTE) The Xpert Xpress SARS-CoV-2/FLU/RSV plus assay is intended as an aid in the diagnosis of influenza from Nasopharyngeal swab specimens and should not be used as a sole basis for treatment. Nasal washings and aspirates are unacceptable for Xpert Xpress SARS-CoV-2/FLU/RSV testing.  Fact Sheet for Patients: BloggerCourse.com  Fact Sheet for Healthcare Providers: SeriousBroker.it  This test is not yet approved or cleared by the [place] FDA and has been authorized for detection and/or diagnosis of SARS-CoV-2  by FDA under an Emergency Use Authorization (EUA). This EUA will remain in effect (meaning this test can be used) for the duration of the COVID-19 declaration under Section 564(b)(1) of the Act, 21 U.S.C. section 360bbb-3(b)(1), unless the authorization is terminated or revoked.  Performed at [facility], [address] [place], [place] [address]   Urine culture     Status: Abnormal   Collection Time: 07/30/20 11:42 AM   Specimen: Urine, Clean Catch  Result Value Ref Range Status   Specimen Description URINE, CLEAN CATCH  Final   Special Requests NONE  Final   Culture (A)  Final    <10,000 COLONIES/mL INSIGNIFICANT GROWTH Performed at [facility], [address] [place], [place] [address]    Report Status 07/31/2020 FINAL  Final  Blood Culture (routine x 2)     Status: None   Collection Time: 08/02/20  1:00 AM   Specimen: BLOOD  Result Value Ref Range Status   Specimen Description   Final    BLOOD BLOOD LEFT FOREARM Performed at [facility], [address] [place], [place] [address]    Special Requests   Final    BOTTLES DRAWN AEROBIC AND ANAEROBIC Blood Culture results may not be optimal due to an inadequate volume of blood received in culture bottles Performed at [facility], [address] [place], [place] [address]    Culture   Final    NO GROWTH 5 DAYS Performed at [facility], [address] [place], [place] [address]    Report Status 08/07/2020 FINAL  Final  Blood Culture (routine x 2)     Status: None   Collection Time: 08/02/20  1:00 AM   Specimen: BLOOD  Result Value Ref Range Status   Specimen Description   Final    BLOOD BLOOD RIGHT FOREARM Performed at [facility], [address], [place], [place] [address]    Special Requests   Final    BOTTLES DRAWN AEROBIC AND ANAEROBIC Blood Culture adequate volume Performed at [facility], [address],  [place], [place] [address]    Culture   Final    NO GROWTH 5 DAYS Performed at [facility], [address], [place], [place] [address]    Report Status 08/07/2020 FINAL  Final  Resp Panel by  RT-PCR (Flu A&B, Covid) Nasopharyngeal Swab     Status: None   Collection Time: 08/02/20  2:32 AM   Specimen: Nasopharyngeal Swab; Nasopharyngeal(NP) swabs in vial transport medium  Result Value Ref Range Status   SARS Coronavirus 2 by RT PCR NEGATIVE NEGATIVE Final    Comment: (NOTE) SARS-CoV-2 target nucleic acids are NOT DETECTED.  The SARS-CoV-2 RNA is generally detectable in upper respiratory specimens during the acute phase of infection. The lowest concentration of SARS-CoV-2 viral copies this assay can detect is 138 copies/mL. A negative result does not preclude SARS-Cov-2 infection and should not be used as the sole basis for treatment or other patient management decisions. A negative result may occur with  improper specimen collection/handling, submission of specimen other than nasopharyngeal swab, presence of viral mutation(s) within the areas targeted by this assay, and inadequate number of viral copies(<138 copies/mL). A negative result must be combined with clinical observations, patient history, and epidemiological information. The expected result is Negative.  Fact Sheet for Patients:  BloggerCourse.com  Fact Sheet for Healthcare Providers:  SeriousBroker.it  This test is no t yet approved or cleared by the [place] FDA and  has been authorized for detection and/or diagnosis of SARS-CoV-2 by FDA under an Emergency Use Authorization (EUA). This EUA will remain  in effect (meaning this test can be used) for the duration of the COVID-19 declaration under Section 564(b)(1) of the Act, 21 U.S.C.section 360bbb-3(b)(1), unless the authorization is terminated  or revoked sooner.       Influenza A by PCR NEGATIVE NEGATIVE  Final   Influenza B by PCR NEGATIVE NEGATIVE Final    Comment: (NOTE) The Xpert Xpress SARS-CoV-2/FLU/RSV plus assay is intended as an aid in the diagnosis of influenza from Nasopharyngeal swab specimens and should not be used as a sole basis for treatment. Nasal washings and aspirates are unacceptable for Xpert Xpress SARS-CoV-2/FLU/RSV testing.  Fact Sheet for Patients: BloggerCourse.com  Fact Sheet for Healthcare Providers: SeriousBroker.it  This test is not yet approved or cleared by the [place] FDA and has been authorized for detection and/or diagnosis of SARS-CoV-2 by FDA under an Emergency Use Authorization (EUA). This EUA will remain in effect (meaning this test can be used) for the duration of the COVID-19 declaration under Section 564(b)(1) of the Act, 21 U.S.C. section 360bbb-3(b)(1), unless the authorization is terminated or revoked.  Performed at [facility], [address], [place], [place] [address]   Urine culture     Status: None   Collection Time: 08/02/20  5:40 AM   Specimen: In/Out Cath Urine  Result Value Ref Range Status   Specimen Description   Final    IN/OUT CATH URINE Performed at [facility], [address], [place], [place] [address]    Special Requests   Final    NONE Performed at [facility], [address], [place], [place] [address]    Culture   Final    NO GROWTH Performed at [facility], [address], [place], [place] [address]    Report Status 08/03/2020 FINAL  Final  MRSA PCR Screening     Status: None   Collection Time: 08/02/20  9:05 AM   Specimen: Nasopharyngeal  Result Value Ref Range Status   MRSA by PCR NEGATIVE NEGATIVE Final    Comment:        The GeneXpert MRSA Assay (FDA approved for NASAL specimens only), is one component of a comprehensive MRSA  colonization surveillance program. It is  not intended to diagnose MRSA infection nor to guide or monitor treatment for MRSA infections. Performed at [facility], [address], [place], [place] [address]   Aerobic/Anaerobic Culture w Gram Stain (surgical/deep wound)     Status: None (Preliminary result)   Collection Time: 08/04/20  5:17 PM   Specimen: Wound  Result Value Ref Range Status   Specimen Description ABSCESS LEFT FOOT  Final   Special Requests NONE  Final   Gram Stain   Final    RARE WBC PRESENT,BOTH PMN AND MONONUCLEAR RARE GRAM POSITIVE COCCI IN PAIRS    Culture   Final    NO GROWTH 3 DAYS NO ANAEROBES ISOLATED; CULTURE IN PROGRESS FOR 5 DAYS Performed at [facility], [address], [place], [place] [address]    Report Status PENDING  Incomplete  Aerobic/Anaerobic Culture w Gram Stain (surgical/deep wound)     Status: None (Preliminary result)   Collection Time: 08/06/20 11:57 AM   Specimen: Soft Tissue, Other  Result Value Ref Range Status   Specimen Description   Final    WOUND LT FOOT Performed at [facility], [address], [place], [place] [address]    Special Requests   Final    NONE Performed at [facility], [address] [place], [place] [address]    Gram Stain   Final    RARE WBC PRESENT, PREDOMINANTLY PMN NO ORGANISMS SEEN    Culture   Final    NO GROWTH < 24 HOURS Performed at [facility], [address], [place], [place] [address]    Report Status PENDING  Incomplete     Serology:   Imaging: If present, new imagings (plain films, ct scans, and mri) have been personally visualized and interpreted; radiology reports have been reviewed. Decision making incorporated into the Impression / Recommendations.  5/02 abi Right: Resting right ankle-brachial index is within normal range. No  evidence of significant right lower extremity arterial disease.   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  Left:  Resting left ankle-brachial index indicates moderate left lower  extremity arterial disease.   Unable to obtain TBI due to bandages and open wound.    4/30 tte 1. Left ventricular ejection fraction, by estimation, is 60 to 65%. The  left ventricle has normal function. The left ventricle has no regional  wall motion abnormalities. Left ventricular diastolic parameters are  consistent with Grade II diastolic  dysfunction (pseudonormalization).  2. Right ventricular systolic function is normal. The right ventricular  size is normal.  3. Left atrial size was mildly dilated.  4. The mitral valve is normal in structure. Trivial mitral valve  regurgitation. No evidence of mitral stenosis.  5. The aortic valve is normal in structure. Aortic valve regurgitation is  not visualized. No aortic stenosis is present.  6. The inferior vena cava is dilated in size with >50% respiratory  variability, suggesting right atrial pressure of 8 mmHg.    4/30 mri left foot 1. Severe soft edema throughout the foot with air in the dorsal aspect of the forefoot tracking proximally to the level of the TMT joints most concerning for necrotizing infection. 2. Minimal marrow edema at the base of the third proximal phalanx without cortical destruction may reflect reactive marrow edema versus early osteomyelitis.    Raymondo Band, MD [facility] [phone] pager  08/07/2020, 3:30 PM

## 2020-08-07 NOTE — Progress Notes (Signed)
  Speech Language Pathology Treatment: Dysphagia  Patient Details Name: Shirley Chen MRN: [id number] DOB: 1955-04-27 Today's Date: 08/07/2020 Time: 1444-5848 SLP Time Calculation (min) (ACUTE ONLY): 15 min  Assessment / Plan / Recommendation Clinical Impression  Pt seen to assure po tolerance and for education to dysphagia compensation strategies.  SLP observed pt consumin beef roast, mashed potatoes and corn.  No indication of dysphagia - adequate oral manipulation observed with intact oral clearance. Pharyngeal swallow clinically appeared swift.  Suspect resolution of concerns for dysphagia with improved respiratory status.  Pt denies any dysphagia and did not demonstrate any symptoms of dysphagia.  Recommend regular consistency diet - encouraged pt to order her own meals as she is edentulous and advised her to specific foods to avoid (ie pork chops, etc).     HPI HPI: 65 year old F admitted with progressive shortness of breath, lower extremity edema and nausea, dx with SIRS, acute respiratory failure with hypoxia, sepsis due to left foot cellulitis with abscess, encephalopathy. Has required supplemental 02. PMH DM-2, HTN, CKD-3, PVD, HLD and tobacco abuse.  Pt underwent BSE and was placed on a dys2 then dys3 diet.  Follow up to assure po tolerance. She is edentulous.      SLP Plan  All goals met       Recommendations  Medication Administration: Whole meds with puree Supervision: Patient able to self feed Compensations: Slow rate;Small sips/bites Postural Changes and/or Swallow Maneuvers: Seated upright 90 degrees;Upright 30-60 min after meal                Oral Care Recommendations: Oral care BID Follow up Recommendations: None SLP Visit Diagnosis: Dysphagia, unspecified (R13.10) Plan: All goals met       GO              Shirley Lime, MS [facility] SLP Acute Rehab Services Office [phone] Pager [phone]    Shirley Chen 08/07/2020, 1:32 PM

## 2020-08-07 NOTE — Anesthesia Postprocedure Evaluation (Signed)
Anesthesia Post Note  Patient: Shirley Chen  Procedure(s) Performed: IRRIGATION AND DEBRIDEMENT LEFT FOOT WOUND (Left Foot)     Patient location during evaluation: PACU Anesthesia Type: Regional and General Level of consciousness: awake and alert Pain management: pain level controlled Vital Signs Assessment: post-procedure vital signs reviewed and stable Respiratory status: spontaneous breathing, nonlabored ventilation, respiratory function stable and patient connected to nasal cannula oxygen Cardiovascular status: blood pressure returned to baseline and stable Postop Assessment: no apparent nausea or vomiting Anesthetic complications: no   No complications documented.  Last Vitals:  Vitals:   08/07/20 0700 08/07/20 0800  BP: (!) 188/62 (!) 165/87  Pulse:  66  Resp:  12  Temp:  36.7 C  SpO2:  95%    Last Pain:  Vitals:   08/07/20 0800  TempSrc: Oral  PainSc: 0-No pain                 Rayven Rettig

## 2020-08-07 NOTE — Progress Notes (Addendum)
Left foot examined there is dead tissue up to the level of the tarsal bones with 2 gangrenous toes and the foot has diffuse erythema. WBC 23  She will need left BKA.  Procedure risk benefits d/w pt NPO BKA tomorrow by Dr Frazier Butt, MD [facility] Office: [phone]

## 2020-08-07 NOTE — Progress Notes (Signed)
Attempted to call report , nurse not available at this time. Number for this RN given to secretary on 4E at [facility]. Will try again.

## 2020-08-07 NOTE — Progress Notes (Signed)
PT Cancellation Note  Patient Details Name: Shirley Chen MRN: [id number] DOB: 02/10/1956   Cancelled Treatment:    Reason Eval/Treat Not Completed: Medical issues which prohibited therapy, to transfer to [name]  .   Rada Hay 08/07/2020, 7:06 AM Blanchard Kelch PT Acute Rehabilitation Services Pager [phone] Office [phone]

## 2020-08-07 NOTE — Progress Notes (Signed)
Responded to consult for assessment of IV sites. Per RN, consult was by Day shift RN and night RN has not assessed pt yet. RAFA PIV infusing and flushes easily with no signs of infiltration/phlebitis. Based on current IV orders, one site is sufficient for medications. VAST available for any further needs.

## 2020-08-08 ENCOUNTER — Inpatient Hospital Stay (HOSPITAL_COMMUNITY): Payer: Medicare Other | Admitting: Certified Registered Nurse Anesthetist

## 2020-08-08 ENCOUNTER — Encounter (HOSPITAL_COMMUNITY)
Admission: EM | Disposition: A | Discharge status: Skilled Nursing Facility | Payer: Self-pay | Source: Home / Self Care | Attending: Student

## 2020-08-08 ENCOUNTER — Encounter (HOSPITAL_COMMUNITY): Payer: Self-pay | Admitting: Internal Medicine

## 2020-08-08 DIAGNOSIS — J9601 Acute respiratory failure with hypoxia: Secondary | ICD-10-CM | POA: Diagnosis not present

## 2020-08-08 DIAGNOSIS — E11628 Type 2 diabetes mellitus with other skin complications: Secondary | ICD-10-CM

## 2020-08-08 DIAGNOSIS — D649 Anemia, unspecified: Secondary | ICD-10-CM

## 2020-08-08 DIAGNOSIS — N179 Acute kidney failure, unspecified: Secondary | ICD-10-CM | POA: Diagnosis not present

## 2020-08-08 DIAGNOSIS — L089 Local infection of the skin and subcutaneous tissue, unspecified: Secondary | ICD-10-CM

## 2020-08-08 HISTORY — PX: AMPUTATION: SHX166

## 2020-08-08 LAB — BASIC METABOLIC PANEL
Anion gap: 8 (ref 5–15)
BUN: 33 mg/dL — ABNORMAL HIGH (ref 8–23)
CO2: 32 mmol/L (ref 22–32)
Calcium: 7.9 mg/dL — ABNORMAL LOW (ref 8.9–10.3)
Chloride: 101 mmol/L (ref 98–111)
Creatinine, Ser: 1.17 mg/dL — ABNORMAL HIGH (ref 0.44–1.00)
GFR, Estimated: 52 mL/min — ABNORMAL LOW (ref >60)
Glucose, Bld: 258 mg/dL — ABNORMAL HIGH (ref 70–99)
Potassium: 3.1 mmol/L — ABNORMAL LOW (ref 3.5–5.1)
Sodium: 141 mmol/L (ref 135–145)

## 2020-08-08 LAB — CBC
HCT: 34.3 % — ABNORMAL LOW (ref 36.0–46.0)
Hemoglobin: 11.1 g/dL — ABNORMAL LOW (ref 12.0–15.0)
MCH: 29.8 pg (ref 26.0–34.0)
MCHC: 32.4 g/dL (ref 30.0–36.0)
MCV: 92 fL (ref 80.0–100.0)
Platelets: 358 10*3/uL (ref 150–400)
RBC: 3.73 MIL/uL — ABNORMAL LOW (ref 3.87–5.11)
RDW: 13.7 % (ref 11.5–15.5)
WBC: 21.7 10*3/uL — ABNORMAL HIGH (ref 4.0–10.5)
nRBC: 0 % (ref 0.0–0.2)

## 2020-08-08 LAB — GLUCOSE, CAPILLARY
Glucose-Capillary: 213 mg/dL — ABNORMAL HIGH (ref 70–99)
Glucose-Capillary: 196 mg/dL — ABNORMAL HIGH (ref 70–99)
Glucose-Capillary: 217 mg/dL — ABNORMAL HIGH (ref 70–99)
Glucose-Capillary: 329 mg/dL — ABNORMAL HIGH (ref 70–99)
Glucose-Capillary: 343 mg/dL — ABNORMAL HIGH (ref 70–99)

## 2020-08-08 SURGERY — AMPUTATION BELOW KNEE
Anesthesia: General | Site: Knee | Laterality: Left

## 2020-08-08 MED ORDER — LACTATED RINGERS IV SOLN: INTRAVENOUS | Status: DC

## 2020-08-08 MED ORDER — POTASSIUM CHLORIDE 10 MEQ/100ML IV SOLN
10.0000 meq | INTRAVENOUS | Status: AC
  Administered 2020-08-08 (×2): 10 meq via INTRAVENOUS
  Filled 2020-08-08 (×2): qty 100

## 2020-08-08 MED ORDER — ONDANSETRON HCL 4 MG/2ML IJ SOLN
INTRAMUSCULAR | Status: DC | PRN
  Administered 2020-08-08: 4 mg via INTRAVENOUS

## 2020-08-08 MED ORDER — OXYCODONE HCL 5 MG PO TABS: 5.0000 mg | ORAL_TABLET | Freq: Once | ORAL | Status: DC | PRN

## 2020-08-08 MED ORDER — PROPOFOL 10 MG/ML IV BOLUS
INTRAVENOUS | Status: DC | PRN
  Administered 2020-08-08: 150 mg via INTRAVENOUS

## 2020-08-08 MED ORDER — MIDAZOLAM HCL 2 MG/2ML IJ SOLN
INTRAMUSCULAR | Status: AC
  Filled 2020-08-08: qty 2

## 2020-08-08 MED ORDER — 0.9 % SODIUM CHLORIDE (POUR BTL) OPTIME
TOPICAL | Status: DC | PRN
  Administered 2020-08-08: 1000 mL

## 2020-08-08 MED ORDER — FENTANYL CITRATE (PF) 250 MCG/5ML IJ SOLN
INTRAMUSCULAR | Status: DC | PRN
  Administered 2020-08-08: 50 ug via INTRAVENOUS

## 2020-08-08 MED ORDER — EPHEDRINE 5 MG/ML INJ
INTRAVENOUS | Status: AC
  Filled 2020-08-08: qty 10

## 2020-08-08 MED ORDER — OXYCODONE HCL 5 MG/5ML PO SOLN: 5.0000 mg | Freq: Once | ORAL | Status: DC | PRN

## 2020-08-08 MED ORDER — MEPERIDINE HCL 25 MG/ML IJ SOLN: 6.2500 mg | INTRAMUSCULAR | Status: DC | PRN

## 2020-08-08 MED ORDER — HYDROMORPHONE HCL 1 MG/ML IJ SOLN: 0.2500 mg | INTRAMUSCULAR | Status: DC | PRN

## 2020-08-08 MED ORDER — MORPHINE SULFATE (PF) 2 MG/ML IV SOLN: 2.0000 mg | INTRAVENOUS | Status: DC | PRN

## 2020-08-08 MED ORDER — GLYCOPYRROLATE PF 0.2 MG/ML IJ SOSY
PREFILLED_SYRINGE | INTRAMUSCULAR | Status: AC
  Filled 2020-08-08: qty 1

## 2020-08-08 MED ORDER — INSULIN ASPART 100 UNIT/ML IJ SOLN
4.0000 [IU] | Freq: Three times a day (TID) | INTRAMUSCULAR | Status: DC
  Administered 2020-08-08 – 2020-08-09 (×3): 4 [IU] via SUBCUTANEOUS

## 2020-08-08 MED ORDER — CEFDINIR 300 MG PO CAPS
300.0000 mg | ORAL_CAPSULE | Freq: Two times a day (BID) | ORAL | Status: DC
  Administered 2020-08-08 – 2020-08-11 (×6): 300 mg via ORAL
  Filled 2020-08-08 (×7): qty 1

## 2020-08-08 MED ORDER — DEXAMETHASONE SODIUM PHOSPHATE 10 MG/ML IJ SOLN
INTRAMUSCULAR | Status: DC | PRN
  Administered 2020-08-08: 5 mg via INTRAVENOUS

## 2020-08-08 MED ORDER — PHENYLEPHRINE 40 MCG/ML (10ML) SYRINGE FOR IV PUSH (FOR BLOOD PRESSURE SUPPORT)
PREFILLED_SYRINGE | INTRAVENOUS | Status: AC
  Filled 2020-08-08: qty 10

## 2020-08-08 MED ORDER — DEXAMETHASONE SODIUM PHOSPHATE 10 MG/ML IJ SOLN
INTRAMUSCULAR | Status: AC
  Filled 2020-08-08: qty 1

## 2020-08-08 MED ORDER — GLYCOPYRROLATE 0.2 MG/ML IJ SOLN
INTRAMUSCULAR | Status: DC | PRN
  Administered 2020-08-08 (×2): .1 mg via INTRAVENOUS

## 2020-08-08 MED ORDER — MIDAZOLAM HCL 2 MG/2ML IJ SOLN: 0.5000 mg | Freq: Once | INTRAMUSCULAR | Status: DC | PRN

## 2020-08-08 MED ORDER — FENTANYL CITRATE (PF) 250 MCG/5ML IJ SOLN
INTRAMUSCULAR | Status: AC
  Filled 2020-08-08: qty 5

## 2020-08-08 MED ORDER — ROPIVACAINE HCL 7.5 MG/ML IJ SOLN
INTRAMUSCULAR | Status: DC | PRN
  Administered 2020-08-08: 20 mL via PERINEURAL

## 2020-08-08 MED ORDER — EPHEDRINE SULFATE 50 MG/ML IJ SOLN
INTRAMUSCULAR | Status: DC | PRN
  Administered 2020-08-08: 10 mg via INTRAVENOUS

## 2020-08-08 MED ORDER — LACTATED RINGERS IV SOLN: INTRAVENOUS | Status: DC | PRN

## 2020-08-08 MED ORDER — PROMETHAZINE HCL 25 MG/ML IJ SOLN: 6.2500 mg | INTRAMUSCULAR | Status: DC | PRN

## 2020-08-08 MED ORDER — PROPOFOL 10 MG/ML IV BOLUS
INTRAVENOUS | Status: AC
  Filled 2020-08-08: qty 40

## 2020-08-08 MED ORDER — BUPIVACAINE LIPOSOME 1.3 % IJ SUSP
INTRAMUSCULAR | Status: AC
  Filled 2020-08-08: qty 20

## 2020-08-08 MED ORDER — OXYCODONE-ACETAMINOPHEN 7.5-325 MG PO TABS
1.0000 | ORAL_TABLET | ORAL | Status: DC | PRN
Start: 2020-08-08 — End: 2020-08-11
  Administered 2020-08-08 – 2020-08-11 (×9): 1 via ORAL
  Filled 2020-08-08 (×9): qty 1

## 2020-08-08 MED ORDER — LIDOCAINE HCL (CARDIAC) PF 100 MG/5ML IV SOSY
PREFILLED_SYRINGE | INTRAVENOUS | Status: DC | PRN
  Administered 2020-08-08: 40 mg via INTRATRACHEAL

## 2020-08-08 MED ORDER — CEFAZOLIN SODIUM 1 G IJ SOLR
INTRAMUSCULAR | Status: AC
  Filled 2020-08-08: qty 20

## 2020-08-08 MED ORDER — INSULIN GLARGINE 100 UNIT/ML ~~LOC~~ SOLN
12.0000 [IU] | Freq: Every day | SUBCUTANEOUS | Status: DC
  Administered 2020-08-08: 12 [IU] via SUBCUTANEOUS
  Filled 2020-08-08 (×2): qty 0.12

## 2020-08-08 MED ORDER — CEFAZOLIN SODIUM-DEXTROSE 2-3 GM-%(50ML) IV SOLR
INTRAVENOUS | Status: DC | PRN
  Administered 2020-08-08: 2 g via INTRAVENOUS

## 2020-08-08 MED ORDER — ONDANSETRON HCL 4 MG/2ML IJ SOLN
INTRAMUSCULAR | Status: AC
  Filled 2020-08-08: qty 2

## 2020-08-08 MED ORDER — BUPIVACAINE-EPINEPHRINE (PF) 0.5% -1:200000 IJ SOLN
INTRAMUSCULAR | Status: DC | PRN
  Administered 2020-08-08: 25 mL via PERINEURAL

## 2020-08-08 MED ORDER — PHENYLEPHRINE HCL (PRESSORS) 10 MG/ML IV SOLN
INTRAVENOUS | Status: DC | PRN
  Administered 2020-08-08 (×2): 80 ug via INTRAVENOUS

## 2020-08-08 SURGICAL SUPPLY — 56 items
BANDAGE ESMARK 6X9 LF (GAUZE/BANDAGES/DRESSINGS) IMPLANT
BLADE SAW GIGLI 510 (BLADE) ×2 IMPLANT
BNDG COHESIVE 6X5 TAN STRL LF (GAUZE/BANDAGES/DRESSINGS) ×2 IMPLANT
BNDG ELASTIC 4X5.8 VLCR STR LF (GAUZE/BANDAGES/DRESSINGS) ×2 IMPLANT
BNDG ELASTIC 6X5.8 VLCR STR LF (GAUZE/BANDAGES/DRESSINGS) ×2 IMPLANT
BNDG ESMARK 6X9 LF (GAUZE/BANDAGES/DRESSINGS)
BNDG GAUZE ELAST 4 BULKY (GAUZE/BANDAGES/DRESSINGS) IMPLANT
CANISTER SUCT 3000ML PPV (MISCELLANEOUS) ×2 IMPLANT
CLIP VESOCCLUDE MED 6/CT (CLIP) IMPLANT
COVER SURGICAL LIGHT HANDLE (MISCELLANEOUS) ×2 IMPLANT
COVER WAND RF STERILE (DRAPES) ×2 IMPLANT
CUFF TOURN SGL QUICK 34 (TOURNIQUET CUFF)
CUFF TOURN SGL QUICK 42 (TOURNIQUET CUFF) IMPLANT
CUFF TRNQT CYL 34X4.125X (TOURNIQUET CUFF) IMPLANT
DRAIN CHANNEL 19F RND (DRAIN) IMPLANT
DRAPE HALF SHEET 40X57 (DRAPES) ×2 IMPLANT
DRAPE INCISE IOBAN 66X45 STRL (DRAPES) IMPLANT
DRAPE ORTHO SPLIT 77X108 STRL (DRAPES) ×2
DRAPE SURG ORHT 6 SPLT 77X108 (DRAPES) ×2 IMPLANT
DRESSING PREVENA PLUS CUSTOM (GAUZE/BANDAGES/DRESSINGS) IMPLANT
DRSG ADAPTIC 3X8 NADH LF (GAUZE/BANDAGES/DRESSINGS) ×2 IMPLANT
DRSG PREVENA PLUS CUSTOM (GAUZE/BANDAGES/DRESSINGS)
ELECT REM PT RETURN 9FT ADLT (ELECTROSURGICAL) ×2
ELECTRODE REM PT RTRN 9FT ADLT (ELECTROSURGICAL) ×1 IMPLANT
EVACUATOR SILICONE 100CC (DRAIN) IMPLANT
GAUZE 4X4 16PLY RFD (DISPOSABLE) ×2 IMPLANT
GAUZE SPONGE 4X4 12PLY STRL (GAUZE/BANDAGES/DRESSINGS) ×2 IMPLANT
GLOVE BIOGEL PI IND STRL 7.5 (GLOVE) ×1 IMPLANT
GLOVE BIOGEL PI INDICATOR 7.5 (GLOVE) ×1
GLOVE SURG SS PI 7.5 STRL IVOR (GLOVE) ×2 IMPLANT
GOWN STRL REUS W/ TWL LRG LVL3 (GOWN DISPOSABLE) ×2 IMPLANT
GOWN STRL REUS W/ TWL XL LVL3 (GOWN DISPOSABLE) ×1 IMPLANT
GOWN STRL REUS W/TWL LRG LVL3 (GOWN DISPOSABLE) ×2
GOWN STRL REUS W/TWL XL LVL3 (GOWN DISPOSABLE) ×1
KIT BASIN OR (CUSTOM PROCEDURE TRAY) ×2 IMPLANT
KIT TURNOVER KIT B (KITS) ×2 IMPLANT
NS IRRIG 1000ML POUR BTL (IV SOLUTION) ×2 IMPLANT
PACK GENERAL/GYN (CUSTOM PROCEDURE TRAY) ×2 IMPLANT
PAD ARMBOARD 7.5X6 YLW CONV (MISCELLANEOUS) ×4 IMPLANT
PREVENA RESTOR ARTHOFORM 46X30 (CANNISTER) IMPLANT
STAPLER VISISTAT 35W (STAPLE) ×2 IMPLANT
STOCKINETTE IMPERVIOUS LG (DRAPES) ×2 IMPLANT
SUT BONE WAX W31G (SUTURE) IMPLANT
SUT ETHIBOND X763 2 0 SH 1 (SUTURE) ×1 IMPLANT
SUT ETHILON 3 0 PS 1 (SUTURE) IMPLANT
SUT SILK 0 TIES 10X30 (SUTURE) ×2 IMPLANT
SUT SILK 2 0 (SUTURE)
SUT SILK 2 0 SH CR/8 (SUTURE) ×2 IMPLANT
SUT SILK 2-0 18XBRD TIE 12 (SUTURE) IMPLANT
SUT SILK 3 0 (SUTURE)
SUT SILK 3-0 18XBRD TIE 12 (SUTURE) IMPLANT
SUT VIC AB 2-0 CT1 18 (SUTURE) ×5 IMPLANT
TAPE UMBILICAL COTTON 1/8X30 (MISCELLANEOUS) IMPLANT
TOWEL GREEN STERILE (TOWEL DISPOSABLE) ×4 IMPLANT
UNDERPAD 30X36 HEAVY ABSORB (UNDERPADS AND DIAPERS) ×2 IMPLANT
WATER STERILE IRR 1000ML POUR (IV SOLUTION) ×2 IMPLANT

## 2020-08-08 NOTE — Anesthesia Procedure Notes (Signed)
Anesthesia Regional Block: Popliteal block   Pre-Anesthetic Checklist: ,, timeout performed, Correct Patient, Correct Site, Correct Laterality, Correct Procedure, Correct Position, site marked, Risks and benefits discussed,  Surgical consent,  Pre-op evaluation,  At surgeon's request and post-op pain management  Laterality: Left and Lower  Prep: chloraprep       Needles:  Injection technique: Single-shot  Needle Type: Echogenic Needle     Needle Length: 9cm  Needle Gauge: 21     Additional Needles:   Procedures:,,,, ultrasound used (permanent image in chart),,,,  Narrative:  Start time: 08/08/2020 12:01 PM End time: 08/08/2020 12:04 PM Injection made incrementally with aspirations every 5 mL.  Performed by: Personally  Anesthesiologist: Jairo Ben, MD  Additional Notes: Pt identified in Holding room.  Monitors applied. Working IV access confirmed. Sterile prep L lateral knee.  #21ga ECHOgenic Arrow block needle to sciatic nerve at split with US guidance.  25cc 0.5% Bupivacaine with 1:200k epi injected incrementally after negative test dose.  Patient asymptomatic, VSS, no heme aspirated, tolerated well.  Sandford Craze, MD

## 2020-08-08 NOTE — Progress Notes (Signed)
OT Cancellation Note  Patient Details Name: Lateesha Bezold MRN: [id number] DOB: Apr 11, 1955   Cancelled Treatment:    Reason Eval/Treat Not Completed: Patient at procedure or test/ unavailable (Pt going to sx for BKA.)  Evern Bio 08/08/2020, 10:28 AM  Martie Round, OTR/L Acute Rehabilitation Services Pager: [phone] Office: [phone]

## 2020-08-08 NOTE — Anesthesia Preprocedure Evaluation (Addendum)
Anesthesia Evaluation  Patient identified by MRN, date of birth, ID band Patient awake    Reviewed: Allergy & Precautions, NPO status , Patient's Chart, lab work & pertinent test results  History of Anesthesia Complications Negative for: history of anesthetic complications  Airway Mallampati: II  TM Distance: >3 FB Neck ROM: Full    Dental  (+) Edentulous Upper, Edentulous Lower   Pulmonary Current Smoker and Patient abstained from smoking.,  08/02/2020 SARS coronaivrus NEG   Pulmonary exam normal        Cardiovascular hypertension, + Peripheral Vascular Disease   Rhythm:Regular Rate:Normal  08/04/2020 ECHO: EF 60-65%. LV normal function, no regional wall motion abnormalities. Grade II DD, no significant valvular abnormalities   Neuro/Psych  Headaches, negative psych ROS   GI/Hepatic negative GI ROS, Neg liver ROS,   Endo/Other  diabetes (glu 213), Insulin Dependent, Oral Hypoglycemic AgentsMorbid obesity  Renal/GU Renal InsufficiencyRenal disease (creat 1.17)     Musculoskeletal  (+) Arthritis , Osteoarthritis,    Abdominal (+) + obese,   Peds  Hematology negative hematology ROS (+)   Anesthesia Other Findings   Reproductive/Obstetrics                            Anesthesia Physical Anesthesia Plan  ASA: III  Anesthesia Plan: General   Post-op Pain Management: GA combined w/ Regional for post-op pain   Induction: Intravenous  PONV Risk Score and Plan: 2 and Ondansetron, Dexamethasone and Treatment may vary due to age or medical condition  Airway Management Planned: LMA  Additional Equipment: None  Intra-op Plan:   Post-operative Plan:   Informed Consent: I have reviewed the patients History and Physical, chart, labs and discussed the procedure including the risks, benefits and alternatives for the proposed anesthesia with the patient or authorized representative who has  indicated his/her understanding and acceptance.       Plan Discussed with: CRNA and Surgeon  Anesthesia Plan Comments: (Plan routine monitors, GA with adductor canal and post tib blocks for post op analgesia)        Anesthesia Quick Evaluation

## 2020-08-08 NOTE — Progress Notes (Signed)
Chart check  Diabetic foot infection/om No BSI  Patient going for BKA today, ie will be source controlled   -given the extent of celllulitis. After patient gets bka, would continue abx for another 5 days then stop (on 5/09) -would continue abx with oral regimen linezolid 600 mg po bid and cefdinir 300 mg po bid   Id will sign off, please call if further question

## 2020-08-08 NOTE — Progress Notes (Addendum)
PROGRESS NOTE        PATIENT DETAILS Name: Shirley Chen Age: 65 y.o. Sex: female Date of Birth: [birth year] Admit Date: 08/01/2020 Admitting Physician Eduard Clos, MD DDU:[id number], Allyne Gee, FNP  Brief Narrative: Patient is a 65 y.o. female DM-2, HTN, CKD stage IIIb, HLD, PAD, tobacco abuse-who presented with shortness of breath, lower extremity edema-with worsening left foot swelling/erythema.  She was thought to have acute hypoxic respiratory failure due to a combination of decompensated heart failure and PNA-and noted to have a abscess on her foot-subsequently evaluated by podiatry-and underwent I&D.  Unfortunately-she continued to have signs and symptoms suggestive of critical limb ischemia-subsequently vascular surgery evaluation was obtained-with recommendations to transfer patient to [facility] for possible arteriogram/BKA.  See below for further details.  Significant events: 4/27>> presenting with shortness of breath- hypoxia due to CHF/PNA-and left foot infection.  Admit to [facility]. 5/2>> irrigation/debridement of left foot wound 5/3>> transfer to [facility] for possible left BKA.   Significant studies: 4/28>> CT chest: No evidence of active pulmonary disease, calcified granulomas in the left lung/left hilum. 4/28>> lower extremity Doppler: No DVT 4/28>> VQ scan: No findings indicative of PE. 4/29>> renal ultrasound: No hydronephrosis 4/30>> CT head: No acute abnormalities. 4/30>> MRI left foot: Severe soft tissue edema-with air in the dorsal aspect of the forefoot-edema at the base of third proximal phalanx-reactive bone marrow versus early osteomyelitis 4/30>> Echo: EF 60-65%, grade 2 diastolic dysfunction. 5/2>> ABI: Right-normal range, left ankle brachial index-moderate left lower extremity arterial disease  Antimicrobial therapy: Vancomycin: 4/27 x 1 Flagyl: 4/27>> Cefepime: 4/27>> Zithromax: 4/27>> 5/3 Zyvox: 4/30>>  Microbiology data: 4/30>>  swab-abscess left foot: No growth 5/2>> left foot wound/intraoperative cultures: No growth  Procedures : 4/30>> left foot irrigation/debridement by podiatry 5/2>> left foot irrigation/debridement by podiatry  Consults: ID, vascular surgery, podiatry  DVT Prophylaxis : heparin injection 5,000 Units Start: 08/02/20 1515   Subjective: Lying comfortably in bed-no chest pain or shortness of breath.  Assessment/Plan: Acute hypoxic respiratory failure: Felt to be due to a combination of CAP/HFpEF exacerbation.  Stable-on broad-spectrum antimicrobial therapy and IV Lasix/Aldactone.  Continue to attempt to slowly titrate down FiO2.  Severe sepsis with left foot necrotizing infection/abscess with critical left leg ischemia: Sepsis physiology has resolved-culture data as above-on broad-spectrum antimicrobial therapy with Zyvox/Flagyl/cefepime-unfortunately even with irrigation and debridement x2-no improvement.  Subsequently evaluated by vascular surgery-for BKA today.  Acute metabolic encephalopathy: Due to hypoxemia/sepsis-resolved.  She is completely awake and alert today.  HFpEF with exacerbation: Volume status is stable-on IV Lasix/Aldactone-we will reassess tomorrow to see if we can transition to oral diuretic regimen.  Hypokalemia: Replete and recheck  AKI on CKD stage IIIb: AKI likely hemodynamically mediated-improving with supportive care.  Renal ultrasound negative for hydronephrosis.  Has significant proteinuria-and probably has underlying diabetic nephropathy  HTN: BP stable-continue amlodipine, Coreg, Lasix, Aldactone-follow and adjust  HLD: Continue statin  DM-2 (A1c 8.0 on 3/17): CBGs on the higher side-add 12 units of Lantus, 4 units of NovoLog with meals-continue SSI-follow and adjust.  Recent Labs    08/07/20 1700 08/07/20 2127 08/08/20 0616  GLUCAP 277* 257* 213*   History of PAD: Vascular surgery following  Normocytic anemia: Due to acute illness-probably has  some amount of anemia due to CKD.  Did require 1 unit of PRBC transfusion on 4/30.  Chronic back pain: Recent MRI  with disc extrusion at L4-L5-physical exam is benign-we will need outpatient follow-up with neurosurgery.  Gout: No flare-continue colchicine  Tobacco use: We will need counseling over the next few days.   Obesity: Estimated body mass index is 39.87 kg/m as calculated from the following:   Height as of this encounter: 5\' 3"  (1.6 m).   Weight as of this encounter: 102.1 kg.    Diet: Diet Order            Diet NPO time specified Except for: Sips with Meds  Diet effective midnight                  Code Status: Full code   Family Communication: None at bedside  Disposition Plan: Status is: Inpatient  Remains inpatient appropriate because:Inpatient level of care appropriate due to severity of illness   Dispo: The patient is from: Home              Anticipated d/c is to: Home              Patient currently is not medically stable to d/c.   Difficult to place patient No   Barriers to Discharge: Left foot necrotizing infection with gangrene-for BKA on 5/4  Antimicrobial agents: Anti-infectives (From admission, onward)   Start     Dose/Rate Route Frequency Ordered Stop   08/06/20 1000  azithromycin (ZITHROMAX) tablet 250 mg        250 mg Oral Daily 08/05/20 1628 08/07/20 0927   08/04/20 1430  DAPTOmycin (CUBICIN) 600 mg in sodium chloride 0.9 % IVPB  Status:  Discontinued        8 mg/kg  75.2 kg (Adjusted) 124 mL/hr over 30 Minutes Intravenous Daily 08/04/20 1317 08/04/20 1324   08/04/20 1430  linezolid (ZYVOX) IVPB 600 mg        600 mg 300 mL/hr over 60 Minutes Intravenous Every 12 hours 08/04/20 1331     08/04/20 0300  vancomycin (VANCOREADY) IVPB 1250 mg/250 mL  Status:  Discontinued        1,250 mg 166.7 mL/hr over 90 Minutes Intravenous Every 48 hours 08/02/20 0508 08/02/20 1115   08/03/20 1300  azithromycin (ZITHROMAX) tablet 500 mg  Status:   Discontinued        500 mg Oral Daily 08/03/20 1203 08/05/20 1621   08/02/20 1900  metroNIDAZOLE (FLAGYL) IVPB 500 mg        500 mg 100 mL/hr over 60 Minutes Intravenous Every 8 hours 08/02/20 1059     08/02/20 1400  ceFEPIme (MAXIPIME) 2 g in sodium chloride 0.9 % 100 mL IVPB        2 g 200 mL/hr over 30 Minutes Intravenous Every 12 hours 08/02/20 0502     08/02/20 1100  metroNIDAZOLE (FLAGYL) IVPB 500 mg  Status:  Discontinued        500 mg 100 mL/hr over 60 Minutes Intravenous Every 8 hours 08/02/20 0500 08/02/20 1100   08/02/20 0515  ceFEPIme (MAXIPIME) 2 g in sodium chloride 0.9 % 100 mL IVPB  Status:  Discontinued        2 g 200 mL/hr over 30 Minutes Intravenous  Once 08/02/20 0500 08/02/20 0502   08/02/20 0515  vancomycin (VANCOCIN) IVPB 1000 mg/200 mL premix  Status:  Discontinued        1,000 mg 200 mL/hr over 60 Minutes Intravenous  Once 08/02/20 0500 08/02/20 0503   08/02/20 0230  vancomycin (VANCOREADY) IVPB 2000 mg/400 mL  2,000 mg 200 mL/hr over 120 Minutes Intravenous STAT 08/02/20 0218 08/02/20 0441   08/02/20 0215  ceFEPIme (MAXIPIME) 2 g in sodium chloride 0.9 % 100 mL IVPB        2 g 200 mL/hr over 30 Minutes Intravenous  Once 08/02/20 0208 08/02/20 0250   08/02/20 0215  metroNIDAZOLE (FLAGYL) IVPB 500 mg        500 mg 100 mL/hr over 60 Minutes Intravenous  Once 08/02/20 0208 08/02/20 0343   08/02/20 0215  vancomycin (VANCOCIN) IVPB 1000 mg/200 mL premix  Status:  Discontinued        1,000 mg 200 mL/hr over 60 Minutes Intravenous  Once 08/02/20 [id number] 08/02/20 0218       Time spent: 25- minutes-Greater than 50% of this time was spent in counseling, explanation of diagnosis, planning of further management, and coordination of care.  MEDICATIONS: Scheduled Meds: . sodium chloride   Intravenous Once  . amLODipine  10 mg Oral Daily  . atorvastatin  40 mg Oral Daily  . carvedilol  25 mg Oral BID WC  . Chlorhexidine Gluconate Cloth  6 each Topical Daily  .  colchicine  0.6 mg Oral Daily  . ezetimibe  10 mg Oral Daily  . fentaNYL  50-100 mcg Intravenous UD  . furosemide  40 mg Intravenous BID  . gabapentin  300 mg Oral BID  . heparin  5,000 Units Subcutaneous Q8H  . insulin aspart  0-15 Units Subcutaneous TID WC  . insulin aspart  0-5 Units Subcutaneous QHS  . mouth rinse  15 mL Mouth Rinse BID  . spironolactone  25 mg Oral Daily   Continuous Infusions: . ceFEPime (MAXIPIME) IV 2 g (08/08/20 0207)  . linezolid (ZYVOX) IV 600 mg (08/07/20 2236)  . metronidazole 500 mg (08/08/20 0323)  . potassium chloride 10 mEq (08/08/20 0845)   PRN Meds:.acetaminophen **OR** acetaminophen, labetalol, traMADol   PHYSICAL EXAM: Vital signs: Vitals:   08/07/20 1926 08/07/20 2352 08/08/20 0459 08/08/20 0500  BP: (!) 160/70 (!) 163/67 (!) 160/67 (!) 160/67  Pulse: 72 70 74 72  Resp: 11 16 14 18   Temp: 98 F (36.7 C) 98 F (36.7 C) 97.8 F (36.6 C) 98.3 F (36.8 C)  TempSrc: Oral Oral Oral Oral  SpO2: 98% 99% 99% 94%  Weight:   102.1 kg   Height:       Filed Weights   08/06/20 0556 08/07/20 0408 08/08/20 0459  Weight: 105.5 kg 102.2 kg 102.1 kg   Body mass index is 39.87 kg/m.   Gen Exam:Alert awake-not in any distress HEENT:atraumatic, normocephalic Chest: B/L clear to auscultation anteriorly CVS:S1S2 regular Abdomen:soft non tender, non distended Extremities:no edema Neurology: Non focal Skin: no rash  I have personally reviewed following labs and imaging studies  LABORATORY DATA: CBC: Recent Labs  Lab 08/02/20 0559 08/03/20 0411 08/04/20 0335 08/04/20 2007 08/05/20 0231 08/06/20 0244 08/07/20 0236 08/08/20 0121  WBC 25.1*   < > 18.8*  --  25.8* 21.9* 23.3* 21.7*  NEUTROABS 21.6*  --  16.1*  --  21.7* 17.0* 17.8*  --   HGB 8.4*   < > 7.9* 9.1* 9.5* 10.1* 11.1* 11.1*  HCT 27.7*   < > 25.4* 28.9* 29.8* 31.3* 35.1* 34.3*  MCV 97.2   < > 96.9  --  95.2 92.6 93.9 92.0  PLT 289   < > 302  --  360 398 400 358   < > = values  in this interval not displayed.  Basic Metabolic Panel: Recent Labs  Lab 08/03/20 0411 08/04/20 0335 08/05/20 0231 08/06/20 0244 08/07/20 0236 08/08/20 0121  NA 142 145 142 146* 144 141  K 4.0 3.9 3.5 3.0* 2.8* 3.1*  CL 112* 115* 113* 112* 106 101  CO2 21* 20* 20* 24 28 32  GLUCOSE 155* 126* 187* 192* 265* 258*  BUN 56* 64* 56* 48* 40* 33*  CREATININE 2.15* 1.90* 1.66* 1.42* 1.30* 1.17*  CALCIUM 8.5* 8.8* 8.7* 8.7* 8.3* 7.9*  MG 2.5* 2.3 2.3 2.0 1.7  --   PHOS 4.7* 4.2 3.5 2.5 3.0  --     GFR: Estimated Creatinine Clearance: 56.2 mL/min (A) (by C-G formula based on SCr of 1.17 mg/dL (H)).  Liver Function Tests: Recent Labs  Lab 08/02/20 0559 08/03/20 0411 08/04/20 0335 08/05/20 0231 08/06/20 0244 08/07/20 0236  AST 17  --  16 14*  --   --   ALT 19  --  17 16  --   --   ALKPHOS 135*  --  131* 128*  --   --   BILITOT 1.4*  --  1.0 0.9  --   --   PROT 6.1*  --  6.0* 6.3*  --   --   ALBUMIN 2.7* 2.5* 2.4*  2.4* 2.4* 2.5* 2.3*   No results for input(s): LIPASE, AMYLASE in the last 168 hours. No results for input(s): AMMONIA in the last 168 hours.  Coagulation Profile: Recent Labs  Lab 08/02/20 0041 08/02/20 0559  INR 1.1 1.1    Cardiac Enzymes: Recent Labs  Lab 08/04/20 0335 08/07/20 0236  CKTOTAL 46 22*    BNP (last 3 results) No results for input(s): PROBNP in the last 8760 hours.  Lipid Profile: No results for input(s): CHOL, HDL, LDLCALC, TRIG, CHOLHDL, LDLDIRECT in the last 72 hours.  Thyroid Function Tests: No results for input(s): TSH, T4TOTAL, FREET4, T3FREE, THYROIDAB in the last 72 hours.  Anemia Panel: No results for input(s): VITAMINB12, FOLATE, FERRITIN, TIBC, IRON, RETICCTPCT in the last 72 hours.  Urine analysis:    Component Value Date/Time   COLORURINE AMBER (A) 08/02/2020 0540   APPEARANCEUR CLEAR 08/02/2020 0540   LABSPEC 1.020 08/02/2020 0540   PHURINE 5.0 08/02/2020 0540   GLUCOSEU 50 (A) 08/02/2020 0540   HGBUR  NEGATIVE 08/02/2020 0540   BILIRUBINUR NEGATIVE 08/02/2020 0540   KETONESUR NEGATIVE 08/02/2020 0540   PROTEINUR >=300 (A) 08/02/2020 0540   UROBILINOGEN 0.2 11/29/2014 1231   NITRITE POSITIVE (A) 08/02/2020 0540   LEUKOCYTESUR NEGATIVE 08/02/2020 0540    Sepsis Labs: Lactic Acid, Venous    Component Value Date/Time   LATICACIDVEN 0.9 08/05/2020 0312    MICROBIOLOGY: Recent Results (from the past 240 hour(s))  Culture, blood (routine x 2)     Status: None   Collection Time: 07/30/20  2:55 AM   Specimen: BLOOD LEFT ARM  Result Value Ref Range Status   Specimen Description BLOOD LEFT ARM  Final   Special Requests   Final    BOTTLES DRAWN AEROBIC AND ANAEROBIC Blood Culture adequate volume   Culture   Final    NO GROWTH 5 DAYS Performed at [facility], [address] [place], [place] [address]    Report Status 08/04/2020 FINAL  Final  Culture, blood (routine x 2)     Status: None   Collection Time: 07/30/20  2:58 AM   Specimen: BLOOD LEFT HAND  Result Value Ref Range Status   Specimen Description BLOOD LEFT HAND  Final   Special Requests   Final    BOTTLES DRAWN AEROBIC AND ANAEROBIC Blood Culture adequate volume   Culture   Final    NO GROWTH 5 DAYS Performed at [facility], [address], [place], [place] [address]    Report Status 08/04/2020 FINAL  Final  Resp Panel by RT-PCR (Flu A&B, Covid) Nasopharyngeal Swab     Status: None   Collection Time: 07/30/20  3:05 AM   Specimen: Nasopharyngeal Swab; Nasopharyngeal(NP) swabs in vial transport medium  Result Value Ref Range Status   SARS Coronavirus 2 by RT PCR NEGATIVE NEGATIVE Final    Comment: (NOTE) SARS-CoV-2 target nucleic acids are NOT DETECTED.  The SARS-CoV-2 RNA is generally detectable in upper respiratory specimens during the acute phase of infection. The lowest concentration of SARS-CoV-2 viral copies this assay can detect is 138 copies/mL. A negative result does not preclude  SARS-Cov-2 infection and should not be used as the sole basis for treatment or other patient management decisions. A negative result may occur with  improper specimen collection/handling, submission of specimen other than nasopharyngeal swab, presence of viral mutation(s) within the areas targeted by this assay, and inadequate number of viral copies(<138 copies/mL). A negative result must be combined with clinical observations, patient history, and epidemiological information. The expected result is Negative.  Fact Sheet for Patients:  BloggerCourse.com  Fact Sheet for Healthcare Providers:  SeriousBroker.it  This test is no t yet approved or cleared by the [place] FDA and  has been authorized for detection and/or diagnosis of SARS-CoV-2 by FDA under an Emergency Use Authorization (EUA). This EUA will remain  in effect (meaning this test can be used) for the duration of the COVID-19 declaration under Section 564(b)(1) of the Act, 21 U.S.C.section 360bbb-3(b)(1), unless the authorization is terminated  or revoked sooner.       Influenza A by PCR NEGATIVE NEGATIVE Final   Influenza B by PCR NEGATIVE NEGATIVE Final    Comment: (NOTE) The Xpert Xpress SARS-CoV-2/FLU/RSV plus assay is intended as an aid in the diagnosis of influenza from Nasopharyngeal swab specimens and should not be used as a sole basis for treatment. Nasal washings and aspirates are unacceptable for Xpert Xpress SARS-CoV-2/FLU/RSV testing.  Fact Sheet for Patients: BloggerCourse.com  Fact Sheet for Healthcare Providers: SeriousBroker.it  This test is not yet approved or cleared by the [place] FDA and has been authorized for detection and/or diagnosis of SARS-CoV-2 by FDA under an Emergency Use Authorization (EUA). This EUA will remain in effect (meaning this test can be used) for the duration of  the COVID-19 declaration under Section 564(b)(1) of the Act, 21 U.S.C. section 360bbb-3(b)(1), unless the authorization is terminated or revoked.  Performed at [facility], [address] [place], [place] [address]   Urine culture     Status: Abnormal   Collection Time: 07/30/20 11:42 AM   Specimen: Urine, Clean Catch  Result Value Ref Range Status   Specimen Description URINE, CLEAN CATCH  Final   Special Requests NONE  Final   Culture (A)  Final    <10,000 COLONIES/mL INSIGNIFICANT GROWTH Performed at [facility], [address] [place], [place] [address]    Report Status 07/31/2020 FINAL  Final  Blood Culture (routine x 2)     Status: None   Collection Time: 08/02/20  1:00 AM   Specimen: BLOOD  Result Value Ref Range Status   Specimen Description   Final  BLOOD BLOOD LEFT FOREARM Performed at [facility], [address] [place], [place] [address]    Special Requests   Final    BOTTLES DRAWN AEROBIC AND ANAEROBIC Blood Culture results may not be optimal due to an inadequate volume of blood received in culture bottles Performed at [facility], [address] [place], [place] [address]    Culture   Final    NO GROWTH 5 DAYS Performed at [facility], [address] [place], [place] [address]    Report Status 08/07/2020 FINAL  Final  Blood Culture (routine x 2)     Status: None   Collection Time: 08/02/20  1:00 AM   Specimen: BLOOD  Result Value Ref Range Status   Specimen Description   Final    BLOOD BLOOD RIGHT FOREARM Performed at [facility], [address] [place], [place] [address]    Special Requests   Final    BOTTLES DRAWN AEROBIC AND ANAEROBIC Blood Culture adequate volume Performed at [facility], [address], [place], [place] [address]    Culture   Final    NO GROWTH 5 DAYS Performed at [facility], [address] [place], [place]  [address]    Report Status 08/07/2020 FINAL  Final  Resp Panel by RT-PCR (Flu A&B, Covid) Nasopharyngeal Swab     Status: None   Collection Time: 08/02/20  2:32 AM   Specimen: Nasopharyngeal Swab; Nasopharyngeal(NP) swabs in vial transport medium  Result Value Ref Range Status   SARS Coronavirus 2 by RT PCR NEGATIVE NEGATIVE Final    Comment: (NOTE) SARS-CoV-2 target nucleic acids are NOT DETECTED.  The SARS-CoV-2 RNA is generally detectable in upper respiratory specimens during the acute phase of infection. The lowest concentration of SARS-CoV-2 viral copies this assay can detect is 138 copies/mL. A negative result does not preclude SARS-Cov-2 infection and should not be used as the sole basis for treatment or other patient management decisions. A negative result may occur with  improper specimen collection/handling, submission of specimen other than nasopharyngeal swab, presence of viral mutation(s) within the areas targeted by this assay, and inadequate number of viral copies(<138 copies/mL). A negative result must be combined with clinical observations, patient history, and epidemiological information. The expected result is Negative.  Fact Sheet for Patients:  BloggerCourse.com  Fact Sheet for Healthcare Providers:  SeriousBroker.it  This test is no t yet approved or cleared by the [place] FDA and  has been authorized for detection and/or diagnosis of SARS-CoV-2 by FDA under an Emergency Use Authorization (EUA). This EUA will remain  in effect (meaning this test can be used) for the duration of the COVID-19 declaration under Section 564(b)(1) of the Act, 21 U.S.C.section 360bbb-3(b)(1), unless the authorization is terminated  or revoked sooner.       Influenza A by PCR NEGATIVE NEGATIVE Final   Influenza B by PCR NEGATIVE NEGATIVE Final    Comment: (NOTE) The Xpert Xpress SARS-CoV-2/FLU/RSV plus assay is intended as an  aid in the diagnosis of influenza from Nasopharyngeal swab specimens and should not be used as a sole basis for treatment. Nasal washings and aspirates are unacceptable for Xpert Xpress SARS-CoV-2/FLU/RSV testing.  Fact Sheet for Patients: BloggerCourse.com  Fact Sheet for Healthcare Providers: SeriousBroker.it  This test is not yet approved or cleared by the [place] FDA and has been authorized for detection and/or diagnosis of SARS-CoV-2 by FDA under an Emergency Use  Authorization (EUA). This EUA will remain in effect (meaning this test can be used) for the duration of the COVID-19 declaration under Section 564(b)(1) of the Act, 21 U.S.C. section 360bbb-3(b)(1), unless the authorization is terminated or revoked.  Performed at [facility], [address], [place], [place] [address]   Urine culture     Status: None   Collection Time: 08/02/20  5:40 AM   Specimen: In/Out Cath Urine  Result Value Ref Range Status   Specimen Description   Final    IN/OUT CATH URINE Performed at [facility], [address], [place], [place] [address]    Special Requests   Final    NONE Performed at [facility], [address], [place], [place] [address]    Culture   Final    NO GROWTH Performed at [facility], [address] [place], [place] [address]    Report Status 08/03/2020 FINAL  Final  MRSA PCR Screening     Status: None   Collection Time: 08/02/20  9:05 AM   Specimen: Nasopharyngeal  Result Value Ref Range Status   MRSA by PCR NEGATIVE NEGATIVE Final    Comment:        The GeneXpert MRSA Assay (FDA approved for NASAL specimens only), is one component of a comprehensive MRSA colonization surveillance program. It is not intended to diagnose MRSA infection nor to guide or monitor treatment for MRSA infections. Performed at [facility],  [address] [place], [place] [address]   Aerobic/Anaerobic Culture w Gram Stain (surgical/deep wound)     Status: None (Preliminary result)   Collection Time: 08/04/20  5:17 PM   Specimen: Wound  Result Value Ref Range Status   Specimen Description ABSCESS LEFT FOOT  Final   Special Requests NONE  Final   Gram Stain   Final    RARE WBC PRESENT,BOTH PMN AND MONONUCLEAR RARE GRAM POSITIVE COCCI IN PAIRS    Culture   Final    NO GROWTH 3 DAYS NO ANAEROBES ISOLATED; CULTURE IN PROGRESS FOR 5 DAYS Performed at [facility], [address] [place], [place] [address]    Report Status PENDING  Incomplete  Aerobic/Anaerobic Culture w Gram Stain (surgical/deep wound)     Status: None (Preliminary result)   Collection Time: 08/06/20 11:57 AM   Specimen: Soft Tissue, Other  Result Value Ref Range Status   Specimen Description   Final    WOUND LT FOOT Performed at [facility], [address] [place], [place] [address]    Special Requests   Final    NONE Performed at [facility], [address], [place], [place] [address]    Gram Stain   Final    RARE WBC PRESENT, PREDOMINANTLY PMN NO ORGANISMS SEEN    Culture   Final    NO GROWTH < 24 HOURS Performed at [facility], [address], [place], [place] [address]    Report Status PENDING  Incomplete    RADIOLOGY STUDIES/RESULTS: No results found.   LOS: 6 days   Jeoffrey Massed, MD  [facility]    To contact the attending provider between 7A-7P or the covering provider during after hours 7P-7A, please log into the web site [email or web address] and access using universal [employer] password for that web site. If you do not have the password, please call the hospital operator.  08/08/2020, 9:31 AM

## 2020-08-08 NOTE — Care Management Important Message (Signed)
Important Message  Patient Details  Name: Shirley Chen MRN: [id number] Date of Birth: 1956/01/25   Medicare Important Message Given:  Yes     Renie Ora 08/08/2020, 10:14 AM

## 2020-08-08 NOTE — Transfer of Care (Signed)
Immediate Anesthesia Transfer of Care Note  Patient: Shirley Chen  Procedure(s) Performed: LEFT BELOW KNEE AMPUTATION (Left Knee)  Patient Location: PACU  Anesthesia Type:General and Regional  Level of Consciousness: drowsy and patient cooperative  Airway & Oxygen Therapy: Patient Spontanous Breathing and Patient connected to face mask oxygen  Post-op Assessment: Report given to RN and Post -op Vital signs reviewed and stable  Post vital signs: Reviewed and stable  Last Vitals:  Vitals Value Taken Time  BP 159/55 08/08/20 1333  Temp    Pulse 73 08/08/20 1335  Resp 15 08/08/20 1335  SpO2 92 % 08/08/20 1335  Vitals shown include unvalidated device data.  Last Pain:  Vitals:   08/08/20 1114  TempSrc:   PainSc: 0-No pain      Patients Stated Pain Goal: 3 (08/08/20 1114)  Complications: No complications documented.

## 2020-08-08 NOTE — Interval H&P Note (Signed)
History and Physical Interval Note:  08/08/2020 11:22 AM  Shirley Chen  has presented today for surgery, with the diagnosis of PAD with left foot wound.  The various methods of treatment have been discussed with the patient and family. After consideration of risks, benefits and other options for treatment, the patient has consented to  Procedure(s): LEFT BELOW KNEE AMPUTATION (Left) as a surgical intervention.  The patient's history has been reviewed, patient examined, no change in status, stable for surgery.  I have reviewed the patient's chart and labs.  Questions were answered to the patient's satisfaction.     Durene Cal

## 2020-08-08 NOTE — Progress Notes (Signed)
[facility] [place]  Subjective  -feels okay   Objective (!) 160/67 72 98.3 F (36.8 C) (Oral) 18 94%  Intake/Output Summary (Last 24 hours) at 08/08/2020 0829 Last data filed at 08/08/2020 0502 Gross per 24 hour  Intake --  Output 3101 ml  Net -3101 ml     Assessment/Planning: Plan discussed with patient.  We will proceed with left below-knee amputation later today.  Risk benefits procedure details discussed.  Need to try to continue to improve glucose control to assist with wound healing.  Shirley Chen 08/08/2020 8:29 AM --  Laboratory Lab Results: Recent Labs    08/07/20 0236 08/08/20 0121  WBC 23.3* 21.7*  HGB 11.1* 11.1*  HCT 35.1* 34.3*  PLT 400 358   BMET Recent Labs    08/07/20 0236 08/08/20 0121  NA 144 141  K 2.8* 3.1*  CL 106 101  CO2 28 32  GLUCOSE 265* 258*  BUN 40* 33*  CREATININE 1.30* 1.17*  CALCIUM 8.3* 7.9*    COAG Lab Results  Component Value Date   INR 1.1 08/02/2020   INR 1.1 08/02/2020   INR 1.07 10/20/2014   No results found for: PTT

## 2020-08-08 NOTE — H&P (View-Only) (Signed)
[facility] [place]  Subjective  -feels okay   Objective (!) 160/67 72 98.3 F (36.8 C) (Oral) 18 94%  Intake/Output Summary (Last 24 hours) at 08/08/2020 0829 Last data filed at 08/08/2020 0502 Gross per 24 hour  Intake --  Output 3101 ml  Net -3101 ml     Assessment/Planning: Plan discussed with patient.  We will proceed with left below-knee amputation later today.  Risk benefits procedure details discussed.  Need to try to continue to improve glucose control to assist with wound healing.  Shirley Chen 08/08/2020 8:29 AM --  Laboratory Lab Results: Recent Labs    08/07/20 0236 08/08/20 0121  WBC 23.3* 21.7*  HGB 11.1* 11.1*  HCT 35.1* 34.3*  PLT 400 358   BMET Recent Labs    08/07/20 0236 08/08/20 0121  NA 144 141  K 2.8* 3.1*  CL 106 101  CO2 28 32  GLUCOSE 265* 258*  BUN 40* 33*  CREATININE 1.30* 1.17*  CALCIUM 8.3* 7.9*    COAG Lab Results  Component Value Date   INR 1.1 08/02/2020   INR 1.1 08/02/2020   INR 1.07 10/20/2014   No results found for: PTT     

## 2020-08-08 NOTE — Anesthesia Procedure Notes (Signed)
Anesthesia Regional Block: Adductor canal block   Pre-Anesthetic Checklist: ,, timeout performed, Correct Patient, Correct Site, Correct Laterality, Correct Procedure, Correct Position, site marked, Risks and benefits discussed,  Surgical consent,  Pre-op evaluation,  At surgeon's request and post-op pain management  Laterality: Left and Lower  Prep: chloraprep       Needles:  Injection technique: Single-shot  Needle Type: Echogenic Needle     Needle Length: 9cm  Needle Gauge: 21     Additional Needles:   Procedures:,,,, ultrasound used (permanent image in chart),,,,  Narrative:  Start time: 08/08/2020 11:54 AM End time: 08/08/2020 12:01 PM Injection made incrementally with aspirations every 5 mL.  Performed by: Personally  Anesthesiologist: Jairo Ben, MD  Additional Notes: Pt identified in Holding room.  Monitors applied. Working IV access confirmed. Sterile prep L thigh.  #21ga ECHOgenic Arrow block needle into adductor canal with US guidance.  20cc 0.75% Ropivacaine injected incrementally after negative test dose.  Patient asymptomatic, VSS, no heme aspirated, tolerated well.  Sandford Craze, MD

## 2020-08-08 NOTE — Op Note (Signed)
    Patient name: Shirley Chen MRN: [id number] DOB: [birth year] Sex: female  08/08/2020 Pre-operative Diagnosis: Left foot ulcer Post-operative diagnosis:  Same Surgeon:  Durene Cal Assistants: Wendi Maya Procedure:   Left below-knee amputation Anesthesia: General Blood Loss: 150 cc Specimens: Left le  Findings: Healthy appearing muscle and good capillary bleeding at the amputation site  Indications: This is a 65 year old female with nonhealing left foot wound.  She comes in today for a left below-knee amputation.  I discussed the risks and benefits of the procedure with the patient.  All questions were answered.  Procedure:  The patient was identified in the holding area and taken to [facility] OR ROOM 11  The patient was then placed supine on the table. general anesthesia was administered.  The patient was prepped and draped in the usual sterile fashion.  A time out was called and antibiotics were administered.  A PA was necessary to expedite the procedure and assist with technical details  A circumferential measurement of the cath was made 14 cm below the tibial tuberosity.  I then created a two thirds one third posterior flap.  #10 blade was used to make an incision.  Cautery was used to divide the subcutaneous tissue.  I then circumferentially exposed the tibia.  A periosteal elevator was used to elevate the periosteum.  I then divided the muscle over top of the fibula.  The anterior tibial neurovascular bundle was ligated between silk ties.  The fibula was circumferentially exposed.  The periosteum was elevated.  A Gigli saw was then used to transect the tibia, beveling the anterior surface.  A double action bone cutter was used to transect the fibula, proximal to the cut edge of the tibia.  I then divided the neurovascular bundle between hemostats and an amputation knife was used to divide the remaining muscle.  The leg was removed as a specimen.  I then ligated the neurovascular bundle  proximal to the cut edge of the tibia, individually with a 2-0 silk tie.  I rasp was used to smooth the bone surface.  The wound was then copiously irrigated.  Hemostasis was achieved.  The fascia was reapproximated with interrupted 2-0 Vicryl and the skin was closed with staples followed by sterile dressings.  There were no immediate complications.   Disposition: To PACU stable.   Juleen China, M.D., [facility] [place] Office: [phone] Pager:  [phone]

## 2020-08-08 NOTE — Anesthesia Procedure Notes (Signed)
Procedure Name: LMA Insertion Date/Time: 08/08/2020 12:26 PM Performed by: Modena Morrow, CRNA Pre-anesthesia Checklist: Patient identified, Emergency Drugs available, Suction available and Patient being monitored Patient Re-evaluated:Patient Re-evaluated prior to induction Oxygen Delivery Method: Circle system utilized Preoxygenation: Pre-oxygenation with 100% oxygen Induction Type: IV induction Ventilation: Mask ventilation without difficulty LMA: LMA inserted LMA Size: 4.0 Placement Confirmation: positive ETCO2 Tube secured with: Tape Dental Injury: Teeth and Oropharynx as per pre-operative assessment

## 2020-08-08 NOTE — Anesthesia Postprocedure Evaluation (Signed)
Anesthesia Post Note  Patient: Shirley Chen  Procedure(s) Performed: LEFT BELOW KNEE AMPUTATION (Left Knee)     Patient location during evaluation: PACU Anesthesia Type: General Level of consciousness: awake and alert, patient cooperative and oriented Pain management: pain level controlled Vital Signs Assessment: post-procedure vital signs reviewed and stable Respiratory status: spontaneous breathing, nonlabored ventilation, respiratory function stable and patient connected to nasal cannula oxygen Cardiovascular status: blood pressure returned to baseline and stable Postop Assessment: no apparent nausea or vomiting Anesthetic complications: no   No complications documented.  Last Vitals:  Vitals:   08/08/20 1403 08/08/20 1423  BP: (!) 166/65 (!) 151/65  Pulse: 69 70  Resp: 15 18  Temp: (!) 36.2 C 36.7 C  SpO2: 94% 93%    Last Pain:  Vitals:   08/08/20 1423  TempSrc: Oral  PainSc:                  Kiwanna Spraker,E. Jadi Deyarmin

## 2020-08-08 NOTE — Progress Notes (Signed)
PT Cancellation Note  Patient Details Name: Shirley Chen MRN: [id number] DOB: 30-Nov-1955   Cancelled Treatment:    Reason Eval/Treat Not Completed: Patient at procedure or test/unavailable;Patient not medically ready.  Pt is surgery for BKA.  Will see pt 5/5 as appropriate and able. 08/08/2020  Jacinto Halim., PT Acute Rehabilitation Services [phone]  (pager) [phone]  (office)   Shirley Chen [name] 08/08/2020, 1:43 PM

## 2020-08-09 ENCOUNTER — Encounter (HOSPITAL_COMMUNITY): Payer: Self-pay | Admitting: Surgery

## 2020-08-09 DIAGNOSIS — D649 Anemia, unspecified: Secondary | ICD-10-CM | POA: Diagnosis not present

## 2020-08-09 DIAGNOSIS — N179 Acute kidney failure, unspecified: Secondary | ICD-10-CM | POA: Diagnosis not present

## 2020-08-09 DIAGNOSIS — E11628 Type 2 diabetes mellitus with other skin complications: Secondary | ICD-10-CM | POA: Diagnosis not present

## 2020-08-09 DIAGNOSIS — J9601 Acute respiratory failure with hypoxia: Secondary | ICD-10-CM | POA: Diagnosis not present

## 2020-08-09 LAB — BASIC METABOLIC PANEL
Anion gap: 7 (ref 5–15)
BUN: 28 mg/dL — ABNORMAL HIGH (ref 8–23)
CO2: 36 mmol/L — ABNORMAL HIGH (ref 22–32)
Calcium: 8.1 mg/dL — ABNORMAL LOW (ref 8.9–10.3)
Chloride: 97 mmol/L — ABNORMAL LOW (ref 98–111)
Creatinine, Ser: 1.27 mg/dL — ABNORMAL HIGH (ref 0.44–1.00)
GFR, Estimated: 48 mL/min — ABNORMAL LOW (ref >60)
Glucose, Bld: 282 mg/dL — ABNORMAL HIGH (ref 70–99)
Potassium: 2.8 mmol/L — ABNORMAL LOW (ref 3.5–5.1)
Sodium: 140 mmol/L (ref 135–145)

## 2020-08-09 LAB — CBC
HCT: 32.9 % — ABNORMAL LOW (ref 36.0–46.0)
Hemoglobin: 10.6 g/dL — ABNORMAL LOW (ref 12.0–15.0)
MCH: 29.7 pg (ref 26.0–34.0)
MCHC: 32.2 g/dL (ref 30.0–36.0)
MCV: 92.2 fL (ref 80.0–100.0)
Platelets: 290 10*3/uL (ref 150–400)
RBC: 3.57 MIL/uL — ABNORMAL LOW (ref 3.87–5.11)
RDW: 13.5 % (ref 11.5–15.5)
WBC: 25.5 10*3/uL — ABNORMAL HIGH (ref 4.0–10.5)
nRBC: 0 % (ref 0.0–0.2)

## 2020-08-09 LAB — AEROBIC/ANAEROBIC CULTURE W GRAM STAIN (SURGICAL/DEEP WOUND): Culture: NO GROWTH

## 2020-08-09 LAB — GLUCOSE, CAPILLARY
Glucose-Capillary: 300 mg/dL — ABNORMAL HIGH (ref 70–99)
Glucose-Capillary: 362 mg/dL — ABNORMAL HIGH (ref 70–99)
Glucose-Capillary: 245 mg/dL — ABNORMAL HIGH (ref 70–99)
Glucose-Capillary: 262 mg/dL — ABNORMAL HIGH (ref 70–99)

## 2020-08-09 MED ORDER — POTASSIUM CHLORIDE 10 MEQ/100ML IV SOLN
10.0000 meq | INTRAVENOUS | Status: AC
  Administered 2020-08-09 (×3): 10 meq via INTRAVENOUS
  Filled 2020-08-09 (×3): qty 100

## 2020-08-09 MED ORDER — INSULIN ASPART 100 UNIT/ML IJ SOLN
8.0000 [IU] | Freq: Three times a day (TID) | INTRAMUSCULAR | Status: DC
  Administered 2020-08-09 – 2020-08-10 (×2): 8 [IU] via SUBCUTANEOUS

## 2020-08-09 MED ORDER — POTASSIUM CHLORIDE CRYS ER 20 MEQ PO TBCR
40.0000 meq | EXTENDED_RELEASE_TABLET | Freq: Once | ORAL | Status: AC
  Administered 2020-08-09: 40 meq via ORAL
  Filled 2020-08-09: qty 2

## 2020-08-09 MED ORDER — INSULIN GLARGINE 100 UNIT/ML ~~LOC~~ SOLN
18.0000 [IU] | Freq: Every day | SUBCUTANEOUS | Status: DC
  Administered 2020-08-09: 18 [IU] via SUBCUTANEOUS
  Filled 2020-08-09 (×2): qty 0.18

## 2020-08-09 MED ORDER — FUROSEMIDE 40 MG PO TABS
40.0000 mg | ORAL_TABLET | Freq: Every day | ORAL | Status: DC
  Administered 2020-08-10 – 2020-08-11 (×2): 40 mg via ORAL
  Filled 2020-08-09 (×2): qty 1

## 2020-08-09 NOTE — Progress Notes (Signed)
Inpatient Diabetes Program Recommendations  AACE/ADA: New Consensus Statement on Inpatient Glycemic Control (2015)  Target Ranges:  Prepandial:   less than 140 mg/dL      Peak postprandial:   less than 180 mg/dL (1-2 hours)      Critically ill patients:  140 - 180 mg/dL   Lab Results  Component Value Date   GLUCAP 300 (H) 08/09/2020   HGBA1C 8.0 (H) 06/21/2020    Review of Glycemic Control Results for CHERAL, CAPPUCCI (MRN [id number]) as of 08/09/2020 10:33  Ref. Range 08/08/2020 10:31 08/08/2020 13:34 08/08/2020 16:53 08/08/2020 21:04 08/09/2020 05:58  Glucose-Capillary Latest Ref Range: 70 - 99 mg/dL 557 (H) 322 (H) 025 (H) 343 (H) 300 (H)    Current orders for Inpatient glycemic control:  Levemir 12 units QHS Novolog 4 units TID  Novolog 0-15 units TID and 0-5 units QHS  Inpatient Diabetes Program Recommendations:    Received Decadron 5 mg on 08/09/20 at 12:25.  CBG's now elevated.  Please consider:  Levemir 12 units BID until steroids cleared Novolog 0-20 units TID   Will continue to follow while inpatient.  Thank you, Dulce Sellar, RN, BSN Diabetes Coordinator Inpatient Diabetes Program [phone] (team pager from 8a-5p)

## 2020-08-09 NOTE — Progress Notes (Signed)
Physical Therapy Re-evaluation Patient Details Name: Shirley Chen MRN: [id number] DOB: Feb 19, 1956 Today's Date: 08/09/2020    History of Present Illness Pt is 65 yo female who presented to ED on 08/01/20 with shortness of breath and admitted with acute resp failure with hypoxia - cause not clear.  Ddimer was elevated but dopplers and VQ scan were negative for clots. S/P I and D  L foot on  08/05/20. To ICU for respiratory distress. transferred to [facility] for Left BKA 5/4.   Pt with PMH of DM-2, HTN, CKD-3, PVD, HLD and tobacco abuse.    PT Comments    Pt admitted with above diagnosis. Pt was able unable to stand on her right LE even with +2 assist and RW.  Pt was able to scoot to drop arm recliner with min assist of 2 persons.  Pt will need therapy to gain strength and learn to use wheelchair to get around home as she may need to rely on wheelchair until residual limb heals.  will continue PT.  Pt currently with functional limitations due to the deficits listed below (see PT Problem List). Pt will benefit from skilled PT to increase their independence and safety with mobility to allow discharge to the venue listed below.     Follow Up Recommendations  SNF     Equipment Recommendations  Other (comment);Amputee Wheelchair [phone] lightweight amputee wheelchair with left amputee pad, anti tippers, desk armrests, and foot rest for right);Wheelchair cushion (18x16 pressure relieving cushion) (tub bench)    Recommendations for Other Services       Precautions / Restrictions Precautions Precautions: Fall Precaution Comments: watch sats Restrictions Weight Bearing Restrictions: No LLE Weight Bearing: Non weight bearing    Mobility  Bed Mobility Overal bed mobility: Needs Assistance Bed Mobility: Supine to Sit;Sit to Supine     Supine to sit: Min assist;HOB elevated Sit to supine: Min assist   General bed mobility comments: min A to use rail, elevate trunk and to scoot hips to EOB     Transfers Overall transfer level: Needs assistance Equipment used: Rolling walker (2 wheeled) Transfers: Sit to/from Stand;Lateral/Scoot Transfers Sit to Stand: Max assist;+2 physical assistance;From elevated surface        Lateral/Scoot Transfers: Min assist;+2 safety/equipment General transfer comment: attempted sit - stand x 2 from EOB, pt unable to clear buttocks from bed with first  attempt and second attempt barely cleared buttocks.  Dropped arm to recliner and pt scooted to recliner with min assist of 2 for safety as pt needed cues to maintain good posture and scoot correctly as well as to hold chair in place.  Overall pt did well with transfer.  Ambulation/Gait             General Gait Details: Unable at present   Stairs             Wheelchair Mobility    Modified Rankin (Stroke Patients Only)       Balance Overall balance assessment: Needs assistance Sitting-balance support: No upper extremity supported Sitting balance-Leahy Scale: Fair Sitting balance - Comments: can sit EOB without physical assist wihtout challenges   Standing balance support: Bilateral upper extremity supported;During functional activity Standing balance-Leahy Scale: Zero Standing balance comment: could not clear buttocks for standing                            Cognition Arousal/Alertness: Awake/alert Behavior During Therapy: WFL for tasks assessed/performed Overall Cognitive Status:  Within Functional Limits for tasks assessed Area of Impairment: Orientation;Attention;Following commands;Safety/judgement;Awareness                       Following Commands: Follows multi-step commands consistently Safety/Judgement: Decreased awareness of safety Awareness: Intellectual          Exercises General Exercises - Lower Extremity Ankle Circles/Pumps: AROM;Right;10 reps;Seated Quad Sets: AROM;Both;10 reps;Seated Gluteal Sets: AROM;Both;10 reps;Supine Long Arc  Quad: AROM;Right;10 reps;Seated Amputee Exercises Hip ABduction/ADduction: AROM;Left;5 reps;Seated Hip Flexion/Marching: AAROM;Left;5 reps;Seated Straight Leg Raises: AAROM;Left;5 reps;Seated    General Comments General comments (skin integrity, edema, etc.): Pt on 3LO2 on arrival with sats >94%.  Desat to 88% with activity therefore reapplied O2 at 3L once pt was in chair.      Pertinent Vitals/Pain Pain Assessment: Faces Faces Pain Scale: Hurts a little bit Pain Location: R foot - neuropathy Pain Descriptors / Indicators: Grimacing;Discomfort Pain Intervention(s): Limited activity within patient's tolerance;Monitored during session;Premedicated before session;Repositioned    Home Living Family/patient expects to be discharged to:: Private residence   Available Help at Discharge: Available PRN/intermittently Type of Home: Apartment Home Access: Level entry   Home Layout: One level Home Equipment: Grab bars - tub/shower;Walker - 2 wheels;Walker - 4 wheels;Wheelchair - manual Additional Comments: Need tub bench    Prior Function            PT Goals (current goals can now be found in the care plan section) Acute Rehab PT Goals Patient Stated Goal: get better, walk again PT Goal Formulation: With patient Time For Goal Achievement: 08/23/20 Potential to Achieve Goals: Good Progress towards PT goals: Progressing toward goals    Frequency    Min 3X/week      PT Plan Discharge plan needs to be updated    Co-evaluation              AM-PAC PT "6 Clicks" Mobility   Outcome Measure  Help needed turning from your back to your side while in a flat bed without using bedrails?: A Little Help needed moving from lying on your back to sitting on the side of a flat bed without using bedrails?: A Little Help needed moving to and from a bed to a chair (including a wheelchair)?: Total Help needed standing up from a chair using your arms (e.g., wheelchair or bedside chair)?:  Total Help needed to walk in hospital room?: Total Help needed climbing 3-5 steps with a railing? : Total 6 Click Score: 10    End of Session Equipment Utilized During Treatment: Gait belt;Oxygen Activity Tolerance: Patient limited by fatigue Patient left: with chair alarm set;in chair;with call bell/phone within reach Nurse Communication: Mobility status PT Visit Diagnosis: Other abnormalities of gait and mobility (R26.89);Pain     Time: 7619-5093 PT Time Calculation (min) (ACUTE ONLY): 23 min  Charges:  $Therapeutic Exercise: 8-22 mins                     Anshul Meddings M,PT Acute Rehab Services [phone] (pager)   Bevelyn Buckles 08/09/2020, 4:34 PM

## 2020-08-09 NOTE — Progress Notes (Addendum)
Occupational Therapy Re Evaluation Patient Details Name: Shirley Chen MRN: [id number] DOB: 04-14-1955 Today's Date: 08/09/2020    History of present illness Pt is 65 yo female who presented to ED on 08/01/20 with shortness of breath and admitted with acute resp failure with hypoxia - cause not clear.  Ddimer was elevated but dopplers and VQ scan were negative for clots. S/P I and D  L foot on  08/05/20. To ICU for respirtory distress. Pt with PMH of DM-2, HTN, CKD-3, PVD, HLD and tobacco abuse. Pt now s/p L BKA 08/08/2020   OT comments  Pt presents with decline in function and safety with ADLs and ADL mobility with impaired strength, balance and endurance. Pt presented to ED 08/01/2020 for SOB and respiratory failure and now s/p L BKA. Pt currently requires min A to sit EOB, min guard A with UB ADLS, mod - max A with LB ADLs/selfcare, total A with toileting at bed level. Pt unable to stand from EOB with RW for SPTs with 4 attempts (unable to clear buttocks to stand). Pt eager and motivated to work with therapy. Pt would continue to benefit from acute OT services to maximize level of function and safety.  Follow Up Recommendations  SNF    Equipment Recommendations  3 in 1 bedside commode;Wheelchair (measurements OT);Wheelchair cushion (measurements OT) (drop arm BSC? RW)    Recommendations for Other Services      Precautions / Restrictions Precautions Precautions: Fall Restrictions Weight Bearing Restrictions: No LLE Weight Bearing: Non weight bearing       Mobility Bed Mobility Overal bed mobility: Needs Assistance Bed Mobility: Supine to Sit;Sit to Supine     Supine to sit: Min assist;HOB elevated Sit to supine: Min assist   General bed mobility comments: min A to use rail, elevate trunk and to scoot hips to EOB    Transfers Overall transfer level: Needs assistance Equipment used: Rolling walker (2 wheeled) Transfers: Sit to/from Stand Sit to Stand: Max assist          General transfer comment: attempted sit - stand x 4 from EOB, pt unable to clear buttocks from bed with first 3 attempts. On 4th attempt pt able to clear buttocks but unable to complete stand    Balance Overall balance assessment: Needs assistance Sitting-balance support: No upper extremity supported Sitting balance-Leahy Scale: Fair       Standing balance-Leahy Scale: Zero                             ADL either performed or assessed with clinical judgement   ADL Overall ADL's : Needs assistance/impaired     Grooming: Wash/dry face;Wash/dry hands;Min guard;Sitting   Upper Body Bathing: Min guard;Sitting   Lower Body Bathing: Sitting/lateral leans;Moderate assistance   Upper Body Dressing : Min guard;Sitting   Lower Body Dressing: Maximal assistance     Toilet Transfer Details (indicate cue type and reason): unable to SPT to [facility] Toileting- Clothing Manipulation and Hygiene: Total assistance;Bed level         General ADL Comments: pt unable to SPT to [facility] after 4 attempts. Pt able to sit EOB for grooming, bathing and dressing tasks     Vision Patient Visual Report: No change from baseline     Perception     Praxis      Cognition Arousal/Alertness: Awake/alert Behavior During Therapy: WFL for tasks assessed/performed Overall Cognitive Status: Within Functional Limits for tasks assessed  Following Commands: Follows multi-step commands consistently   Awareness: Intellectual            Exercises     Shoulder Instructions       General Comments      Pertinent Vitals/ Pain       Pain Assessment: Faces Faces Pain Scale: Hurts a little bit Pain Location: R foot - neuropathy Pain Descriptors / Indicators: Grimacing;Discomfort Pain Intervention(s): Monitored during session;Repositioned  Home Living Family/patient expects to be discharged to:: Private residence   Available Help at Discharge: Available  PRN/intermittently Type of Home: Apartment Home Access: Level entry     Home Layout: One level     Bathroom Shower/Tub: Chief Strategy Officer: Handicapped height Bathroom Accessibility: Yes   Home Equipment: Grab bars - tub/shower;Walker - 2 wheels;Walker - 4 wheels;Wheelchair - manual   Additional Comments: Need tub bench      Prior Functioning/Environment              Frequency  Min 2X/week        Progress Toward Goals  OT Goals(current goals can now be found in the care plan section)  Progress towards OT goals: OT to reassess next treatment (goals updated)  Acute Rehab OT Goals Patient Stated Goal: get better, walk again OT Goal Formulation: With patient ADL Goals Pt Will Perform Grooming: with supervision;with set-up;sitting Pt Will Perform Upper Body Bathing: with supervision;with set-up;sitting Pt Will Perform Lower Body Bathing: sitting/lateral leans;with min assist Pt Will Perform Lower Body Dressing: with mod assist;sitting/lateral leans Pt Will Transfer to Toilet: with max assist;with mod assist;with +2 assist;bedside commode;stand pivot transfer;squat pivot transfer Pt Will Perform Toileting - Clothing Manipulation and hygiene: with max assist;with mod assist;sitting/lateral leans  Plan Discharge plan needs to be updated    Co-evaluation                 AM-PAC OT "6 Clicks" Daily Activity     Outcome Measure   Help from another person eating meals?: None Help from another person taking care of personal grooming?: A Little Help from another person toileting, which includes using toliet, bedpan, or urinal?: Total Help from another person bathing (including washing, rinsing, drying)?: A Lot Help from another person to put on and taking off regular upper body clothing?: A Little Help from another person to put on and taking off regular lower body clothing?: A Lot 6 Click Score: 15    End of Session Equipment Utilized During  Treatment: Gait belt;Rolling walker  OT Visit Diagnosis: Other symptoms and signs involving cognitive function;Pain Pain - Right/Left: Right Pain - part of body: Ankle and joints of foot   Activity Tolerance Patient limited by fatigue   Patient Left in bed;with call bell/phone within reach;with bed alarm set   Nurse Communication          Time: 6659-9357 OT Time Calculation (min): 20 min  Charges: OT General Charges $OT Visit: 1 Visit OT Evaluation $OT Re-eval: 1 Re-eval     Galen Manila 08/09/2020, 3:25 PM

## 2020-08-09 NOTE — Progress Notes (Signed)
PROGRESS NOTE        PATIENT DETAILS Name: Shirley Chen Age: 65 y.o. Sex: female Date of Birth: 23-Mar-1956 Admit Date: 08/01/2020 Admitting Physician Eduard Clos, MD ZOX:[id number], Allyne Gee, FNP  Brief Narrative: Patient is a 65 y.o. female DM-2, HTN, CKD stage IIIb, HLD, PAD, tobacco abuse-who presented with shortness of breath, lower extremity edema-with worsening left foot swelling/erythema.  She was thought to have acute hypoxic respiratory failure due to a combination of decompensated heart failure and PNA-and noted to have a abscess on her foot-subsequently evaluated by podiatry-and underwent I&D.  Unfortunately-she continued to have signs and symptoms suggestive of critical limb ischemia-subsequently vascular surgery evaluation was obtained-with recommendations to transfer patient to [facility] for possible arteriogram/BKA.  See below for further details.  Significant events: 4/27>> presenting with shortness of breath- hypoxia due to CHF/PNA-and left foot infection.  Admit to [facility] 5/2>> irrigation/debridement of left foot wound 5/3>> transfer to [facility] for possible left BKA.   Significant studies: 4/28>> CT chest: No evidence of active pulmonary disease, calcified granulomas in the left lung/left hilum. 4/28>> lower extremity Doppler: No DVT 4/28>> VQ scan: No findings indicative of PE. 4/29>> renal ultrasound: No hydronephrosis 4/30>> CT head: No acute abnormalities. 4/30>> MRI left foot: Severe soft tissue edema-with air in the dorsal aspect of the forefoot-edema at the base of third proximal phalanx-reactive bone marrow versus early osteomyelitis 4/30>> Echo: EF 60-65%, grade 2 diastolic dysfunction. 5/2>> ABI: Right-normal range, left ankle brachial index-moderate left lower extremity arterial disease  Antimicrobial therapy: Vancomycin: 4/27 x 1 Flagyl: 4/27>> 5/3 Cefepime: 4/27>> 5/3 Zithromax: 4/27>> 5/3 Zyvox: 4/30>> Cefdinir:  5/4>>  Microbiology data: 4/30>> swab-abscess left foot: No growth 5/2>> left foot wound/intraoperative cultures: No growth  Procedures : 4/30>> left foot irrigation/debridement by podiatry 5/2>> left foot irrigation/debridement by podiatry 5/4>> left BKA  Consults: ID, vascular surgery, podiatry  DVT Prophylaxis : heparin injection 5,000 Units Start: 08/02/20 1515   Subjective: Lying comfortably in bed-no major issues overnight.  Assessment/Plan: Acute hypoxic respiratory failure: Felt to be due to a combination of CAP/HFpEF exacerbation.  Improved-attempt to titrate off oxygen.  Remains on antimicrobial therapy and diuretics.  Severe sepsis with left foot necrotizing infection/abscess with critical left leg ischemia-s/p left BKA on 5/4: Sepsis physiology has resolved-transitioned to oral antimicrobial therapy by ID on 5/4-stop date of 5/9.  Postop care deferred to vascular surgery..  Acute metabolic encephalopathy: Due to hypoxemia/sepsis-resolved.  She is completely awake and alert today.  HFpEF with exacerbation: Volume status has improved-changed to oral Lasix-continues to be on Aldactone.  Follow closely.   Hypokalemia: Continue to replete and recheck.  AKI on CKD stage IIIb: AKI likely hemodynamically mediated-improving with supportive care.  Renal ultrasound negative for hydronephrosis.  Has significant proteinuria-and probably has underlying diabetic nephropathy  HTN: BP stable-continue amlodipine, Coreg, Lasix, Aldactone-follow and adjust  HLD: Continue statin  DM-2 (A1c 8.0 on 3/17): CBGs on the higher side-increase Lantus to 18 units, increase Premeal NovoLog to 8 units-follow and adjust.    Recent Labs    08/08/20 2104 08/09/20 0558 08/09/20 1128  GLUCAP 343* 300* 362*   History of PAD: Vascular surgery following  Normocytic anemia: Due to acute illness-probably has some amount of anemia due to CKD.  Did require 1 unit of PRBC transfusion on  4/30.  Chronic back pain: Recent MRI with disc  extrusion at L4-L5-physical exam is benign-we will need outpatient follow-up with neurosurgery.  Gout: No flare-continue colchicine  Tobacco use: We will need counseling over the next few days.   Obesity: Estimated body mass index is 38.55 kg/m as calculated from the following:   Height as of this encounter: 5\' 3"  (1.6 m).   Weight as of this encounter: 98.7 kg.    Diet: Diet Order            Diet Carb Modified Fluid consistency: Thin; Room service appropriate? Yes  Diet effective now                  Code Status: Full code   Family Communication: None at bedside  Disposition Plan: Status is: Inpatient  Remains inpatient appropriate because:Inpatient level of care appropriate due to severity of illness   Dispo: The patient is from: Home              Anticipated d/c is to: Home              Patient currently is not medically stable to d/c.   Difficult to place patient No   Barriers to Discharge: Left foot necrotizing infection with gangrene-s/p BKA-await rehab services evaluation.  Antimicrobial agents: Anti-infectives (From admission, onward)   Start     Dose/Rate Route Frequency Ordered Stop   08/08/20 2200  cefdinir (OMNICEF) capsule 300 mg        300 mg Oral Every 12 hours 08/08/20 1339 08/14/20 0959   08/06/20 1000  azithromycin (ZITHROMAX) tablet 250 mg        250 mg Oral Daily 08/05/20 1628 08/07/20 0927   08/04/20 1430  DAPTOmycin (CUBICIN) 600 mg in sodium chloride 0.9 % IVPB  Status:  Discontinued        8 mg/kg  75.2 kg (Adjusted) 124 mL/hr over 30 Minutes Intravenous Daily 08/04/20 1317 08/04/20 1324   08/04/20 1430  linezolid (ZYVOX) IVPB 600 mg        600 mg 300 mL/hr over 60 Minutes Intravenous Every 12 hours 08/04/20 1331 08/13/20 2359   08/04/20 0300  vancomycin (VANCOREADY) IVPB 1250 mg/250 mL  Status:  Discontinued        1,250 mg 166.7 mL/hr over 90 Minutes Intravenous Every 48 hours  08/02/20 0508 08/02/20 1115   08/03/20 1300  azithromycin (ZITHROMAX) tablet 500 mg  Status:  Discontinued        500 mg Oral Daily 08/03/20 1203 08/05/20 1621   08/02/20 1900  metroNIDAZOLE (FLAGYL) IVPB 500 mg  Status:  Discontinued        500 mg 100 mL/hr over 60 Minutes Intravenous Every 8 hours 08/02/20 1059 08/08/20 1339   08/02/20 1400  ceFEPIme (MAXIPIME) 2 g in sodium chloride 0.9 % 100 mL IVPB  Status:  Discontinued        2 g 200 mL/hr over 30 Minutes Intravenous Every 12 hours 08/02/20 0502 08/08/20 1339   08/02/20 1100  metroNIDAZOLE (FLAGYL) IVPB 500 mg  Status:  Discontinued        500 mg 100 mL/hr over 60 Minutes Intravenous Every 8 hours 08/02/20 0500 08/02/20 1100   08/02/20 0515  ceFEPIme (MAXIPIME) 2 g in sodium chloride 0.9 % 100 mL IVPB  Status:  Discontinued        2 g 200 mL/hr over 30 Minutes Intravenous  Once 08/02/20 0500 08/02/20 0502   08/02/20 0515  vancomycin (VANCOCIN) IVPB 1000 mg/200 mL premix  Status:  Discontinued  1,000 mg 200 mL/hr over 60 Minutes Intravenous  Once 08/02/20 0500 08/02/20 0503   08/02/20 0230  vancomycin (VANCOREADY) IVPB 2000 mg/400 mL        2,000 mg 200 mL/hr over 120 Minutes Intravenous STAT 08/02/20 0218 08/02/20 0441   08/02/20 0215  ceFEPIme (MAXIPIME) 2 g in sodium chloride 0.9 % 100 mL IVPB        2 g 200 mL/hr over 30 Minutes Intravenous  Once 08/02/20 0208 08/02/20 0250   08/02/20 0215  metroNIDAZOLE (FLAGYL) IVPB 500 mg        500 mg 100 mL/hr over 60 Minutes Intravenous  Once 08/02/20 0208 08/02/20 0343   08/02/20 0215  vancomycin (VANCOCIN) IVPB 1000 mg/200 mL premix  Status:  Discontinued        1,000 mg 200 mL/hr over 60 Minutes Intravenous  Once 08/02/20 4098 08/02/20 0218       Time spent: 25- minutes-Greater than 50% of this time was spent in counseling, explanation of diagnosis, planning of further management, and coordination of care.  MEDICATIONS: Scheduled Meds: . sodium chloride   Intravenous  Once  . amLODipine  10 mg Oral Daily  . atorvastatin  40 mg Oral Daily  . carvedilol  25 mg Oral BID WC  . cefdinir  300 mg Oral Q12H  . Chlorhexidine Gluconate Cloth  6 each Topical Daily  . colchicine  0.6 mg Oral Daily  . ezetimibe  10 mg Oral Daily  . fentaNYL  50-100 mcg Intravenous UD  . furosemide  40 mg Intravenous BID  . gabapentin  300 mg Oral BID  . heparin  5,000 Units Subcutaneous Q8H  . insulin aspart  0-15 Units Subcutaneous TID WC  . insulin aspart  0-5 Units Subcutaneous QHS  . insulin aspart  4 Units Subcutaneous TID WC  . insulin glargine  12 Units Subcutaneous QHS  . mouth rinse  15 mL Mouth Rinse BID  . spironolactone  25 mg Oral Daily   Continuous Infusions: . linezolid (ZYVOX) IV 600 mg (08/09/20 0832)   PRN Meds:.acetaminophen **OR** acetaminophen, labetalol, morphine injection, oxyCODONE-acetaminophen, traMADol   PHYSICAL EXAM: Vital signs: Vitals:   08/09/20 0400 08/09/20 0438 08/09/20 0501 08/09/20 1129  BP:  (!) 176/75 (!) 154/68 (!) 154/66  Pulse: 60 74 71 66  Resp:  Temp:  98.1 F (36.7 C)  98 F (36.7 C)  TempSrc:  Oral  Oral  SpO2:  98% 93% 98%  Weight:   98.7 kg   Height:       Filed Weights   08/07/20 0408 08/08/20 0459 08/09/20 0501  Weight: 102.2 kg 102.1 kg 98.7 kg   Body mass index is 38.55 kg/m.   Gen Exam:Alert awake-not in any distress HEENT:atraumatic, normocephalic Chest: B/L clear to auscultation anteriorly CVS:S1S2 regular Abdomen:soft non tender, non distended Extremities: Left BKA Neurology: Non focal Skin: no rash  I have personally reviewed following labs and imaging studies  LABORATORY DATA: CBC: Recent Labs  Lab 08/04/20 0335 08/04/20 2007 08/05/20 0231 08/06/20 0244 08/07/20 0236 08/08/20 0121 08/09/20 0742  WBC 18.8*  --  25.8* 21.9* 23.3* 21.7* 25.5*  NEUTROABS 16.1*  --  21.7* 17.0* 17.8*  --   --   HGB 7.9*   < > 9.5* 10.1* 11.1* 11.1* 10.6*  HCT 25.4*   < > 29.8* 31.3* 35.1*  34.3* 32.9*  MCV 96.9  --  95.2 92.6 93.9 92.0 92.2  PLT 302  --  360 398  400 358 290   < > = values in this interval not displayed.    Basic Metabolic Panel: Recent Labs  Lab 08/03/20 0411 08/04/20 0335 08/05/20 0231 08/06/20 0244 08/07/20 0236 08/08/20 0121 08/09/20 0742  NA 142 145 142 146* 144 141 140  K 4.0 3.9 3.5 3.0* 2.8* 3.1* 2.8*  CL 112* 115* 113* 112* 106 101 97*  CO2 21* 20* 20* 24 28 32 36*  GLUCOSE 155* 126* 187* 192* 265* 258* 282*  BUN 56* 64* 56* 48* 40* 33* 28*  CREATININE 2.15* 1.90* 1.66* 1.42* 1.30* 1.17* 1.27*  CALCIUM 8.5* 8.8* 8.7* 8.7* 8.3* 7.9* 8.1*  MG 2.5* 2.3 2.3 2.0 1.7  --   --   PHOS 4.7* 4.2 3.5 2.5 3.0  --   --     GFR: Estimated Creatinine Clearance: 50.7 mL/min (A) (by C-G formula based on SCr of 1.27 mg/dL (H)).  Liver Function Tests: Recent Labs  Lab 08/03/20 0411 08/04/20 0335 08/05/20 0231 08/06/20 0244 08/07/20 0236  AST  --  16 14*  --   --   ALT  --  17 16  --   --   ALKPHOS  --  131* 128*  --   --   BILITOT  --  1.0 0.9  --   --   PROT  --  6.0* 6.3*  --   --   ALBUMIN 2.5* 2.4*  2.4* 2.4* 2.5* 2.3*   No results for input(s): LIPASE, AMYLASE in the last 168 hours. No results for input(s): AMMONIA in the last 168 hours.  Coagulation Profile: No results for input(s): INR, PROTIME in the last 168 hours.  Cardiac Enzymes: Recent Labs  Lab 08/04/20 0335 08/07/20 0236  CKTOTAL 46 22*    BNP (last 3 results) No results for input(s): PROBNP in the last 8760 hours.  Lipid Profile: No results for input(s): CHOL, HDL, LDLCALC, TRIG, CHOLHDL, LDLDIRECT in the last 72 hours.  Thyroid Function Tests: No results for input(s): TSH, T4TOTAL, FREET4, T3FREE, THYROIDAB in the last 72 hours.  Anemia Panel: No results for input(s): VITAMINB12, FOLATE, FERRITIN, TIBC, IRON, RETICCTPCT in the last 72 hours.  Urine analysis:    Component Value Date/Time   COLORURINE AMBER (A) 08/02/2020 0540   APPEARANCEUR CLEAR  08/02/2020 0540   LABSPEC 1.020 08/02/2020 0540   PHURINE 5.0 08/02/2020 0540   GLUCOSEU 50 (A) 08/02/2020 0540   HGBUR NEGATIVE 08/02/2020 0540   BILIRUBINUR NEGATIVE 08/02/2020 0540   KETONESUR NEGATIVE 08/02/2020 0540   PROTEINUR >=300 (A) 08/02/2020 0540   UROBILINOGEN 0.2 11/29/2014 1231   NITRITE POSITIVE (A) 08/02/2020 0540   LEUKOCYTESUR NEGATIVE 08/02/2020 0540    Sepsis Labs: Lactic Acid, Venous    Component Value Date/Time   LATICACIDVEN 0.9 08/05/2020 0312    MICROBIOLOGY: Recent Results (from the past 240 hour(s))  Blood Culture (routine x 2)     Status: None   Collection Time: 08/02/20  1:00 AM   Specimen: BLOOD  Result Value Ref Range Status   Specimen Description   Final    BLOOD BLOOD LEFT FOREARM Performed at [facility], [address] [place], [place] [address]    Special Requests   Final    BOTTLES DRAWN AEROBIC AND ANAEROBIC Blood Culture results may not be optimal due to an inadequate volume of blood received in culture bottles Performed at [facility], [address] [place], [place] [address]    Culture   Final  NO GROWTH 5 DAYS Performed at [facility], [address], [place], [place] [address]    Report Status 08/07/2020 FINAL  Final  Blood Culture (routine x 2)     Status: None   Collection Time: 08/02/20  1:00 AM   Specimen: BLOOD  Result Value Ref Range Status   Specimen Description   Final    BLOOD BLOOD RIGHT FOREARM Performed at [facility], [address] [place], [place] [address]    Special Requests   Final    BOTTLES DRAWN AEROBIC AND ANAEROBIC Blood Culture adequate volume Performed at [facility], [address], [place], [place] [address]    Culture   Final    NO GROWTH 5 DAYS Performed at [facility], [address] [place], [place] [address]    Report Status 08/07/2020 FINAL  Final  Resp Panel by RT-PCR (Flu A&B, Covid)  Nasopharyngeal Swab     Status: None   Collection Time: 08/02/20  2:32 AM   Specimen: Nasopharyngeal Swab; Nasopharyngeal(NP) swabs in vial transport medium  Result Value Ref Range Status   SARS Coronavirus 2 by RT PCR NEGATIVE NEGATIVE Final    Comment: (NOTE) SARS-CoV-2 target nucleic acids are NOT DETECTED.  The SARS-CoV-2 RNA is generally detectable in upper respiratory specimens during the acute phase of infection. The lowest concentration of SARS-CoV-2 viral copies this assay can detect is 138 copies/mL. A negative result does not preclude SARS-Cov-2 infection and should not be used as the sole basis for treatment or other patient management decisions. A negative result may occur with  improper specimen collection/handling, submission of specimen other than nasopharyngeal swab, presence of viral mutation(s) within the areas targeted by this assay, and inadequate number of viral copies(<138 copies/mL). A negative result must be combined with clinical observations, patient history, and epidemiological information. The expected result is Negative.  Fact Sheet for Patients:  BloggerCourse.com  Fact Sheet for Healthcare Providers:  SeriousBroker.it  This test is no t yet approved or cleared by the [place] FDA and  has been authorized for detection and/or diagnosis of SARS-CoV-2 by FDA under an Emergency Use Authorization (EUA). This EUA will remain  in effect (meaning this test can be used) for the duration of the COVID-19 declaration under Section 564(b)(1) of the Act, 21 U.S.C.section 360bbb-3(b)(1), unless the authorization is terminated  or revoked sooner.       Influenza A by PCR NEGATIVE NEGATIVE Final   Influenza B by PCR NEGATIVE NEGATIVE Final    Comment: (NOTE) The Xpert Xpress SARS-CoV-2/FLU/RSV plus assay is intended as an aid in the diagnosis of influenza from Nasopharyngeal swab specimens and should not be  used as a sole basis for treatment. Nasal washings and aspirates are unacceptable for Xpert Xpress SARS-CoV-2/FLU/RSV testing.  Fact Sheet for Patients: BloggerCourse.com  Fact Sheet for Healthcare Providers: SeriousBroker.it  This test is not yet approved or cleared by the [place] FDA and has been authorized for detection and/or diagnosis of SARS-CoV-2 by FDA under an Emergency Use Authorization (EUA). This EUA will remain in effect (meaning this test can be used) for the duration of the COVID-19 declaration under Section 564(b)(1) of the Act, 21 U.S.C. section 360bbb-3(b)(1), unless the authorization is terminated or revoked.  Performed at [facility], [address], [place], [place] [address]   Urine culture     Status: None   Collection Time: 08/02/20  5:40 AM   Specimen: In/Out  Cath Urine  Result Value Ref Range Status   Specimen Description   Final    IN/OUT CATH URINE Performed at [facility], [address] [place], [place] [address]    Special Requests   Final    NONE Performed at [facility], [address], [place], [place] [address]    Culture   Final    NO GROWTH Performed at [facility], [address] [place], [place] [address]    Report Status 08/03/2020 FINAL  Final  MRSA PCR Screening     Status: None   Collection Time: 08/02/20  9:05 AM   Specimen: Nasopharyngeal  Result Value Ref Range Status   MRSA by PCR NEGATIVE NEGATIVE Final    Comment:        The GeneXpert MRSA Assay (FDA approved for NASAL specimens only), is one component of a comprehensive MRSA colonization surveillance program. It is not intended to diagnose MRSA infection nor to guide or monitor treatment for MRSA infections. Performed at [facility], [address] [place], [place] [address]   Aerobic/Anaerobic Culture w Gram Stain  (surgical/deep wound)     Status: None (Preliminary result)   Collection Time: 08/04/20  5:17 PM   Specimen: Wound  Result Value Ref Range Status   Specimen Description ABSCESS LEFT FOOT  Final   Special Requests NONE  Final   Gram Stain   Final    RARE WBC PRESENT,BOTH PMN AND MONONUCLEAR RARE GRAM POSITIVE COCCI IN PAIRS    Culture   Final    NO GROWTH 4 DAYS NO ANAEROBES ISOLATED; CULTURE IN PROGRESS FOR 5 DAYS Performed at [facility], [address], [place], [place] [address]    Report Status PENDING  Incomplete  Aerobic/Anaerobic Culture w Gram Stain (surgical/deep wound)     Status: None (Preliminary result)   Collection Time: 08/06/20 11:57 AM   Specimen: Soft Tissue, Other  Result Value Ref Range Status   Specimen Description   Final    WOUND LT FOOT Performed at [facility], [address], [place], [place] [address]    Special Requests   Final    NONE Performed at [facility], [address], [place], [place] [address]    Gram Stain   Final    RARE WBC PRESENT, PREDOMINANTLY PMN NO ORGANISMS SEEN    Culture   Final    NO GROWTH 3 DAYS NO ANAEROBES ISOLATED; CULTURE IN PROGRESS FOR 5 DAYS Performed at [facility], [address], [place], [place] [address]    Report Status PENDING  Incomplete    RADIOLOGY STUDIES/RESULTS: No results found.   LOS: 7 days   Jeoffrey Massed, MD  [facility]    To contact the attending provider between 7A-7P or the covering provider during after hours 7P-7A, please log into the web site [email or web address] and access using universal [place] password for that web site. If you do not have the password, please call the hospital operator.  08/09/2020, 1:34 PM

## 2020-08-09 NOTE — Progress Notes (Addendum)
   VASCULAR SURGERY ASSESSMENT & PLAN:   1 Day Post-Op: Left BKA VSS. Afebrile. Pain controlled. Dressing dry.  Take down dressing tomorrow.  PT/OT  SUBJECTIVE:   Some mild left residual limb cramping  PHYSICAL EXAM:   Vitals:   08/08/20 2251 08/09/20 0400 08/09/20 0438 08/09/20 0501  BP: (!) 149/71  (!) 176/75 (!) 154/68  Pulse: 74 60 74 71  Resp: 18  14 13   Temp: 98.7 F (37.1 C)  98.1 F (36.7 C)   TempSrc: Oral  Oral   SpO2: 97%  98% 93%  Weight:    98.7 kg  Height:       General appearance: Awake, alert in no apparent distress Cardiac: Heart rate and rhythm are regular Respirations: Nonlabored Extremities: left residual limb surgical dressing is dry and intact. 2+ right DP pulse  LABS:   Lab Results  Component Value Date   WBC 21.7 (H) 08/08/2020   HGB 11.1 (L) 08/08/2020   HCT 34.3 (L) 08/08/2020   MCV 92.0 08/08/2020   PLT 358 08/08/2020   Lab Results  Component Value Date   CREATININE 1.17 (H) 08/08/2020   Lab Results  Component Value Date   INR 1.1 08/02/2020   CBG (last 3)  Recent Labs    08/08/20 1653 08/08/20 2104 08/09/20 0558  GLUCAP 329* 343* 300*    PROBLEM LIST:    Principal Problem:   Acute respiratory failure with hypoxia (HCC) Active Problems:   Tobacco abuse   PAD (peripheral artery disease) (HCC)   HTN (hypertension)   Diabetes mellitus with peripheral vascular disease (HCC)   Diabetic foot infection (HCC)   AKI (acute kidney injury) (HCC)   Necrotizing fasciitis (HCC)   CURRENT MEDS:   . sodium chloride   Intravenous Once  . amLODipine  10 mg Oral Daily  . atorvastatin  40 mg Oral Daily  . carvedilol  25 mg Oral BID WC  . cefdinir  300 mg Oral Q12H  . Chlorhexidine Gluconate Cloth  6 each Topical Daily  . colchicine  0.6 mg Oral Daily  . ezetimibe  10 mg Oral Daily  . fentaNYL  50-100 mcg Intravenous UD  . furosemide  40 mg Intravenous BID  . gabapentin  300 mg Oral BID  . heparin  5,000 Units Subcutaneous  Q8H  . insulin aspart  0-15 Units Subcutaneous TID WC  . insulin aspart  0-5 Units Subcutaneous QHS  . insulin aspart  4 Units Subcutaneous TID WC  . insulin glargine  12 Units Subcutaneous QHS  . mouth rinse  15 mL Mouth Rinse BID  . spironolactone  25 mg Oral Daily   10/09/20, Milinda Antis Office: [phone] 08/09/2020

## 2020-08-10 DIAGNOSIS — D649 Anemia, unspecified: Secondary | ICD-10-CM | POA: Diagnosis not present

## 2020-08-10 DIAGNOSIS — E11628 Type 2 diabetes mellitus with other skin complications: Secondary | ICD-10-CM | POA: Diagnosis not present

## 2020-08-10 DIAGNOSIS — J9601 Acute respiratory failure with hypoxia: Secondary | ICD-10-CM | POA: Diagnosis not present

## 2020-08-10 DIAGNOSIS — N179 Acute kidney failure, unspecified: Secondary | ICD-10-CM | POA: Diagnosis not present

## 2020-08-10 LAB — GLUCOSE, CAPILLARY
Glucose-Capillary: 213 mg/dL — ABNORMAL HIGH (ref 70–99)
Glucose-Capillary: 221 mg/dL — ABNORMAL HIGH (ref 70–99)
Glucose-Capillary: 226 mg/dL — ABNORMAL HIGH (ref 70–99)
Glucose-Capillary: 269 mg/dL — ABNORMAL HIGH (ref 70–99)

## 2020-08-10 LAB — MAGNESIUM: Magnesium: 1.8 mg/dL (ref 1.7–2.4)

## 2020-08-10 LAB — BASIC METABOLIC PANEL
Anion gap: 8 (ref 5–15)
BUN: 26 mg/dL — ABNORMAL HIGH (ref 8–23)
CO2: 35 mmol/L — ABNORMAL HIGH (ref 22–32)
Calcium: 7.9 mg/dL — ABNORMAL LOW (ref 8.9–10.3)
Chloride: 95 mmol/L — ABNORMAL LOW (ref 98–111)
Creatinine, Ser: 1.28 mg/dL — ABNORMAL HIGH (ref 0.44–1.00)
GFR, Estimated: 47 mL/min — ABNORMAL LOW (ref >60)
Glucose, Bld: 271 mg/dL — ABNORMAL HIGH (ref 70–99)
Potassium: 3.1 mmol/L — ABNORMAL LOW (ref 3.5–5.1)
Sodium: 138 mmol/L (ref 135–145)

## 2020-08-10 LAB — SARS CORONAVIRUS 2 (TAT 6-24 HRS): SARS Coronavirus 2: NEGATIVE

## 2020-08-10 LAB — SURGICAL PATHOLOGY

## 2020-08-10 MED ORDER — POTASSIUM CHLORIDE CRYS ER 20 MEQ PO TBCR
40.0000 meq | EXTENDED_RELEASE_TABLET | ORAL | Status: AC
  Administered 2020-08-10 (×2): 40 meq via ORAL
  Filled 2020-08-10 (×2): qty 2

## 2020-08-10 MED ORDER — INSULIN ASPART 100 UNIT/ML IJ SOLN
10.0000 [IU] | Freq: Three times a day (TID) | INTRAMUSCULAR | Status: DC
  Administered 2020-08-10 – 2020-08-11 (×3): 10 [IU] via SUBCUTANEOUS

## 2020-08-10 MED ORDER — INSULIN GLARGINE 100 UNIT/ML ~~LOC~~ SOLN
22.0000 [IU] | Freq: Every day | SUBCUTANEOUS | Status: DC
  Administered 2020-08-10: 22 [IU] via SUBCUTANEOUS
  Filled 2020-08-10 (×2): qty 0.22

## 2020-08-10 MED ORDER — MAGNESIUM SULFATE 2 GM/50ML IV SOLN
2.0000 g | Freq: Once | INTRAVENOUS | Status: AC
  Administered 2020-08-10: 2 g via INTRAVENOUS
  Filled 2020-08-10: qty 50

## 2020-08-10 NOTE — Progress Notes (Addendum)
  Progress Note    08/10/2020 7:36 AM 2 Days Post-Op  Subjective:  Pain controlled with p.o. pain medication   Vitals:   08/09/20 2305 08/10/20 0329  BP: (!) 151/58 (!) 159/62  Pulse: 66 70  Resp: 19 16  Temp: 97.6 F (36.4 C) 98.2 F (36.8 C)  SpO2: 95% 95%   Physical Exam: Lungs:  Non labored Incisions:  L BKA incision c/d/i Extremities:  Palpable R DP pulse Neurologic: A&O  CBC    Component Value Date/Time   WBC 25.5 (H) 08/09/2020 0742   RBC 3.57 (L) 08/09/2020 0742   HGB 10.6 (L) 08/09/2020 0742   HCT 32.9 (L) 08/09/2020 0742   PLT 290 08/09/2020 0742   MCV 92.2 08/09/2020 0742   MCH 29.7 08/09/2020 0742   MCHC 32.2 08/09/2020 0742   RDW 13.5 08/09/2020 0742   LYMPHSABS 1.9 08/07/2020 0236   MONOABS 2.1 (H) 08/07/2020 0236   EOSABS 0.1 08/07/2020 0236   BASOSABS 0.1 08/07/2020 0236    BMET    Component Value Date/Time   NA 138 08/10/2020 0134   K 3.1 (L) 08/10/2020 0134   CL 95 (L) 08/10/2020 0134   CO2 35 (H) 08/10/2020 0134   GLUCOSE 271 (H) 08/10/2020 0134   BUN 26 (H) 08/10/2020 0134   CREATININE 1.28 (H) 08/10/2020 0134   CREATININE 0.62 10/25/2014 1549   CALCIUM 7.9 (L) 08/10/2020 0134   GFRNONAA 47 (L) 08/10/2020 0134   GFRNONAA >89 10/25/2014 1549   GFRAA 47 (L) 12/03/2014 0635   GFRAA >89 10/25/2014 1549    INR    Component Value Date/Time   INR 1.1 08/02/2020 0559     Intake/Output Summary (Last 24 hours) at 08/10/2020 0736 Last data filed at 08/10/2020 0300 Gross per 24 hour  Intake 1440.65 ml  Output 1700 ml  Net -259.35 ml     Assessment/Plan:  65 y.o. female is s/p L BKA 2 Days Post-Op   L BKA incision healing well; dressing changed; can probably switch to retention sock tomorrow OOB with therapy Ok for d/c to SNF when approved    Emilie Rutter, PA-C [facility] [phone] 08/10/2020 7:36 AM  I agree with the above.  I have seen and evaluated the patient.  She is postoperative day 2 from a  left below-knee amputation.  She has a strong desire to ambulate again with a prosthesis.  Hopefully her disposition can be modified so that she could go to CIR.  She will return to clinic in 4 weeks for staple removal.  Durene Cal

## 2020-08-10 NOTE — TOC Progression Note (Signed)
Transition of Care [facility]) - Progression Note    Patient Details  Name: Aleesa Sweigert MRN: [id number] Date of Birth: 1955-08-21  Transition of Care [facility]) CM/SW Contact  Eduard Roux, [place] Phone Number: 08/10/2020, 4:53 PM  Clinical Narrative:     [facility] confirmed bed offer-can admit tomorrow if medically stable and insurance authorization has been approved.  RN updated and covid test requested   CSW will continue to follow and assist with discharging  Planning  Antony Blackbird, MSW, LCSW Clinical Social Worker   Expected Discharge Plan: Home w Home Health Services Barriers to Discharge: Continued Medical Work up  Expected Discharge Plan and Services Expected Discharge Plan: Home w Home Health Services   Discharge Planning Services: CM Consult   Living arrangements for the past 2 months: Apartment                                       Social Determinants of Health (SDOH) Interventions    Readmission Risk Interventions No flowsheet data found.

## 2020-08-10 NOTE — Progress Notes (Signed)
PROGRESS NOTE        PATIENT DETAILS Name: Shirley Chen Age: 65 y.o. Sex: female Date of Birth: 11-Oct-1955 Admit Date: 08/01/2020 Admitting Physician Eduard Clos, MD WUJ:[id number], Allyne Gee, FNP  Brief Narrative: Patient is a 65 y.o. female DM-2, HTN, CKD stage IIIb, HLD, PAD, tobacco abuse-who presented with shortness of breath, lower extremity edema-with worsening left foot swelling/erythema.  She was thought to have acute hypoxic respiratory failure due to a combination of decompensated heart failure and PNA-and noted to have a abscess on her foot-subsequently evaluated by podiatry-and underwent I&D.  Unfortunately-she continued to have signs and symptoms suggestive of critical limb ischemia-subsequently vascular surgery evaluation was obtained-with recommendations to transfer patient to [facility] for possible arteriogram/BKA.  See below for further details.  Significant events: 4/27>> presenting with shortness of breath- hypoxia due to CHF/PNA-and left foot infection.  Admit to [facility]. 5/2>> irrigation/debridement of left foot wound 5/3>> transfer to [facility] for possible left BKA.   Significant studies: 4/28>> CT chest: No evidence of active pulmonary disease, calcified granulomas in the left lung/left hilum. 4/28>> lower extremity Doppler: No DVT 4/28>> VQ scan: No findings indicative of PE. 4/29>> renal ultrasound: No hydronephrosis 4/30>> CT head: No acute abnormalities. 4/30>> MRI left foot: Severe soft tissue edema-with air in the dorsal aspect of the forefoot-edema at the base of third proximal phalanx-reactive bone marrow versus early osteomyelitis 4/30>> Echo: EF 60-65%, grade 2 diastolic dysfunction. 5/2>> ABI: Right-normal range, left ankle brachial index-moderate left lower extremity arterial disease  Antimicrobial therapy: Vancomycin: 4/27 x 1 Flagyl: 4/27>> 5/3 Cefepime: 4/27>> 5/3 Zithromax: 4/27>> 5/3 Zyvox: 4/30>> Cefdinir:  5/4>>  Microbiology data: 4/30>> swab-abscess left foot: No growth 5/2>> left foot wound/intraoperative cultures: No growth  Procedures : 4/30>> left foot irrigation/debridement by podiatry 5/2>> left foot irrigation/debridement by podiatry 5/4>> left BKA  Consults: ID, vascular surgery, podiatry  DVT Prophylaxis : heparin injection 5,000 Units Start: 08/02/20 1515   Subjective: Lying comfortably in bed-no major issues overnight.  Assessment/Plan: Acute hypoxic respiratory failure: Felt to be due to a combination of CAP/HFpEF exacerbation.  Improved-attempt to titrate off oxygen.  Remains on antimicrobial therapy and diuretics.  Severe sepsis with left foot necrotizing infection/abscess with critical left leg ischemia-s/p left BKA on 5/4: Sepsis physiology has resolved-transitioned to oral antimicrobial therapy by ID on 5/4-stop date of 5/9.  Postop care deferred to vascular surgery..  Acute metabolic encephalopathy: Due to hypoxemia/sepsis-resolved.  She is completely awake and alert today.  HFpEF with exacerbation: Volume status has improved-changed to oral Lasix-continues to be on Aldactone.  Follow closely.   Hypokalemia/hypomagnesemia: Continue to replete-recheck tomorrow.  AKI on CKD stage IIIb: AKI likely hemodynamically mediated-improving with supportive care.  Renal ultrasound negative for hydronephrosis.  Has significant proteinuria-and probably has underlying diabetic nephropathy  HTN: BP stable-continue amlodipine, Coreg, Lasix, Aldactone-follow and adjust  HLD: Continue statin  DM-2 (A1c 8.0 on 3/17): CBGs remain on the higher side-increase Lantus to 22 units nightly, increase Premeal NovoLog to 10 units-continue SSI-follow and adjust.    Recent Labs    08/09/20 1957 08/10/20 0629 08/10/20 1114  GLUCAP 262* 213* 221*   History of PAD: Vascular surgery following  Normocytic anemia: Due to acute illness-probably has some amount of anemia due to CKD.  Did  require 1 unit of PRBC transfusion on 4/30.  Chronic back pain: Recent MRI  with disc extrusion at L4-L5-physical exam is benign-we will need outpatient follow-up with neurosurgery.  Gout: No flare-continue colchicine  Tobacco use: We will need counseling over the next few days.   Obesity: Estimated body mass index is 38.55 kg/m as calculated from the following:   Height as of this encounter: 5\' 3"  (1.6 m).   Weight as of this encounter: 98.7 kg.    Diet: Diet Order            Diet Carb Modified Fluid consistency: Thin; Room service appropriate? Yes  Diet effective now                  Code Status: Full code   Family Communication: None at bedside-she will update family herself-I have asked her to let me know if is any questions.  Disposition Plan: Status is: Inpatient  Remains inpatient appropriate because:Inpatient level of care appropriate due to severity of illness   Dispo: The patient is from: Home              Anticipated d/c is to: Home              Patient currently is not medically stable to d/c.   Difficult to place patient No   Barriers to Discharge: Left foot necrotizing infection with gangrene-s/p BKA-await SNF  Antimicrobial agents: Anti-infectives (From admission, onward)   Start     Dose/Rate Route Frequency Ordered Stop   08/08/20 2200  cefdinir (OMNICEF) capsule 300 mg        300 mg Oral Every 12 hours 08/08/20 1339 08/14/20 0959   08/06/20 1000  azithromycin (ZITHROMAX) tablet 250 mg        250 mg Oral Daily 08/05/20 1628 08/07/20 0927   08/04/20 1430  DAPTOmycin (CUBICIN) 600 mg in sodium chloride 0.9 % IVPB  Status:  Discontinued        8 mg/kg  75.2 kg (Adjusted) 124 mL/hr over 30 Minutes Intravenous Daily 08/04/20 1317 08/04/20 1324   08/04/20 1430  linezolid (ZYVOX) IVPB 600 mg        600 mg 300 mL/hr over 60 Minutes Intravenous Every 12 hours 08/04/20 1331 08/13/20 2359   08/04/20 0300  vancomycin (VANCOREADY) IVPB 1250 mg/250 mL   Status:  Discontinued        1,250 mg 166.7 mL/hr over 90 Minutes Intravenous Every 48 hours 08/02/20 0508 08/02/20 1115   08/03/20 1300  azithromycin (ZITHROMAX) tablet 500 mg  Status:  Discontinued        500 mg Oral Daily 08/03/20 1203 08/05/20 1621   08/02/20 1900  metroNIDAZOLE (FLAGYL) IVPB 500 mg  Status:  Discontinued        500 mg 100 mL/hr over 60 Minutes Intravenous Every 8 hours 08/02/20 1059 08/08/20 1339   08/02/20 1400  ceFEPIme (MAXIPIME) 2 g in sodium chloride 0.9 % 100 mL IVPB  Status:  Discontinued        2 g 200 mL/hr over 30 Minutes Intravenous Every 12 hours 08/02/20 0502 08/08/20 1339   08/02/20 1100  metroNIDAZOLE (FLAGYL) IVPB 500 mg  Status:  Discontinued        500 mg 100 mL/hr over 60 Minutes Intravenous Every 8 hours 08/02/20 0500 08/02/20 1100   08/02/20 0515  ceFEPIme (MAXIPIME) 2 g in sodium chloride 0.9 % 100 mL IVPB  Status:  Discontinued        2 g 200 mL/hr over 30 Minutes Intravenous  Once 08/02/20 0500 08/02/20 0502   08/02/20  0515  vancomycin (VANCOCIN) IVPB 1000 mg/200 mL premix  Status:  Discontinued        1,000 mg 200 mL/hr over 60 Minutes Intravenous  Once 08/02/20 0500 08/02/20 0503   08/02/20 0230  vancomycin (VANCOREADY) IVPB 2000 mg/400 mL        2,000 mg 200 mL/hr over 120 Minutes Intravenous STAT 08/02/20 0218 08/02/20 0441   08/02/20 0215  ceFEPIme (MAXIPIME) 2 g in sodium chloride 0.9 % 100 mL IVPB        2 g 200 mL/hr over 30 Minutes Intravenous  Once 08/02/20 0208 08/02/20 0250   08/02/20 0215  metroNIDAZOLE (FLAGYL) IVPB 500 mg        500 mg 100 mL/hr over 60 Minutes Intravenous  Once 08/02/20 0208 08/02/20 0343   08/02/20 0215  vancomycin (VANCOCIN) IVPB 1000 mg/200 mL premix  Status:  Discontinued        1,000 mg 200 mL/hr over 60 Minutes Intravenous  Once 08/02/20 4765 08/02/20 0218       Time spent: 25- minutes-Greater than 50% of this time was spent in counseling, explanation of diagnosis, planning of further management,  and coordination of care.  MEDICATIONS: Scheduled Meds: . amLODipine  10 mg Oral Daily  . atorvastatin  40 mg Oral Daily  . carvedilol  25 mg Oral BID WC  . cefdinir  300 mg Oral Q12H  . Chlorhexidine Gluconate Cloth  6 each Topical Daily  . colchicine  0.6 mg Oral Daily  . ezetimibe  10 mg Oral Daily  . fentaNYL  50-100 mcg Intravenous UD  . furosemide  40 mg Oral Daily  . gabapentin  300 mg Oral BID  . heparin  5,000 Units Subcutaneous Q8H  . insulin aspart  0-15 Units Subcutaneous TID WC  . insulin aspart  0-5 Units Subcutaneous QHS  . insulin aspart  8 Units Subcutaneous TID WC  . insulin glargine  18 Units Subcutaneous QHS  . mouth rinse  15 mL Mouth Rinse BID  . potassium chloride  40 mEq Oral Q4H  . spironolactone  25 mg Oral Daily   Continuous Infusions: . linezolid (ZYVOX) IV 600 mg (08/10/20 1052)   PRN Meds:.acetaminophen **OR** acetaminophen, labetalol, morphine injection, oxyCODONE-acetaminophen, traMADol   PHYSICAL EXAM: Vital signs: Vitals:   08/09/20 2305 08/10/20 0329 08/10/20 0742 08/10/20 1115  BP: (!) 151/58 (!) 159/62 (!) 132/53 (!) 149/61  Pulse: 66 70 71 79  Resp: 19 16 13 16   Temp: 97.6 F (36.4 C) 98.2 F (36.8 C) 97.7 F (36.5 C) 97.8 F (36.6 C)  TempSrc: Oral Oral Oral Oral  SpO2: 95% 95% 94% 93%  Weight:      Height:       Filed Weights   08/07/20 0408 08/08/20 0459 08/09/20 0501  Weight: 102.2 kg 102.1 kg 98.7 kg   Body mass index is 38.55 kg/m.   Gen Exam:Alert awake-not in any distress HEENT:atraumatic, normocephalic Chest: B/L clear to auscultation anteriorly CVS:S1S2 regular Abdomen:soft non tender, non distended Extremities: Left BKA. Neurology: Non focal Skin: no rash  I have personally reviewed following labs and imaging studies  LABORATORY DATA: CBC: Recent Labs  Lab 08/04/20 0335 08/04/20 2007 08/05/20 0231 08/06/20 0244 08/07/20 0236 08/08/20 0121 08/09/20 0742  WBC 18.8*  --  25.8* 21.9* 23.3* 21.7*  25.5*  NEUTROABS 16.1*  --  21.7* 17.0* 17.8*  --   --   HGB 7.9*   < > 9.5* 10.1* 11.1* 11.1* 10.6*  HCT 25.4*   < >  29.8* 31.3* 35.1* 34.3* 32.9*  MCV 96.9  --  95.2 92.6 93.9 92.0 92.2  PLT 302  --  360 398 400 358 290   < > = values in this interval not displayed.    Basic Metabolic Panel: Recent Labs  Lab 08/04/20 0335 08/05/20 0231 08/06/20 0244 08/07/20 0236 08/08/20 0121 08/09/20 0742 08/10/20 0134  NA 145 142 146* 144 141 140 138  K 3.9 3.5 3.0* 2.8* 3.1* 2.8* 3.1*  CL 115* 113* 112* 106 101 97* 95*  CO2 20* 20* 24 28 32 36* 35*  GLUCOSE 126* 187* 192* 265* 258* 282* 271*  BUN 64* 56* 48* 40* 33* 28* 26*  CREATININE 1.90* 1.66* 1.42* 1.30* 1.17* 1.27* 1.28*  CALCIUM 8.8* 8.7* 8.7* 8.3* 7.9* 8.1* 7.9*  MG 2.3 2.3 2.0 1.7  --   --  1.8  PHOS 4.2 3.5 2.5 3.0  --   --   --     GFR: Estimated Creatinine Clearance: 50.4 mL/min (A) (by C-G formula based on SCr of 1.28 mg/dL (H)).  Liver Function Tests: Recent Labs  Lab 08/04/20 0335 08/05/20 0231 08/06/20 0244 08/07/20 0236  AST 16 14*  --   --   ALT 17 16  --   --   ALKPHOS 131* 128*  --   --   BILITOT 1.0 0.9  --   --   PROT 6.0* 6.3*  --   --   ALBUMIN 2.4*  2.4* 2.4* 2.5* 2.3*   No results for input(s): LIPASE, AMYLASE in the last 168 hours. No results for input(s): AMMONIA in the last 168 hours.  Coagulation Profile: No results for input(s): INR, PROTIME in the last 168 hours.  Cardiac Enzymes: Recent Labs  Lab 08/04/20 0335 08/07/20 0236  CKTOTAL 46 22*    BNP (last 3 results) No results for input(s): PROBNP in the last 8760 hours.  Lipid Profile: No results for input(s): CHOL, HDL, LDLCALC, TRIG, CHOLHDL, LDLDIRECT in the last 72 hours.  Thyroid Function Tests: No results for input(s): TSH, T4TOTAL, FREET4, T3FREE, THYROIDAB in the last 72 hours.  Anemia Panel: No results for input(s): VITAMINB12, FOLATE, FERRITIN, TIBC, IRON, RETICCTPCT in the last 72 hours.  Urine analysis:     Component Value Date/Time   COLORURINE AMBER (A) 08/02/2020 0540   APPEARANCEUR CLEAR 08/02/2020 0540   LABSPEC 1.020 08/02/2020 0540   PHURINE 5.0 08/02/2020 0540   GLUCOSEU 50 (A) 08/02/2020 0540   HGBUR NEGATIVE 08/02/2020 0540   BILIRUBINUR NEGATIVE 08/02/2020 0540   KETONESUR NEGATIVE 08/02/2020 0540   PROTEINUR >=300 (A) 08/02/2020 0540   UROBILINOGEN 0.2 11/29/2014 1231   NITRITE POSITIVE (A) 08/02/2020 0540   LEUKOCYTESUR NEGATIVE 08/02/2020 0540    Sepsis Labs: Lactic Acid, Venous    Component Value Date/Time   LATICACIDVEN 0.9 08/05/2020 0312    MICROBIOLOGY: Recent Results (from the past 240 hour(s))  Blood Culture (routine x 2)     Status: None   Collection Time: 08/02/20  1:00 AM   Specimen: BLOOD  Result Value Ref Range Status   Specimen Description   Final    BLOOD BLOOD LEFT FOREARM Performed at [facility], [address], [place], [place] [address]    Special Requests   Final    BOTTLES DRAWN AEROBIC AND ANAEROBIC Blood Culture results may not be optimal due to an inadequate volume of blood received in culture bottles Performed at [facility], [address] [place], [place] [address]  Culture   Final    NO GROWTH 5 DAYS Performed at [facility], [address], [place], [place] [address]    Report Status 08/07/2020 FINAL  Final  Blood Culture (routine x 2)     Status: None   Collection Time: 08/02/20  1:00 AM   Specimen: BLOOD  Result Value Ref Range Status   Specimen Description   Final    BLOOD BLOOD RIGHT FOREARM Performed at [facility], [address] [place], [place] [address]    Special Requests   Final    BOTTLES DRAWN AEROBIC AND ANAEROBIC Blood Culture adequate volume Performed at [facility], [address] [place], [place] [address]    Culture   Final    NO GROWTH 5 DAYS Performed at [facility], [address], [place], [place]  [address]    Report Status 08/07/2020 FINAL  Final  Resp Panel by RT-PCR (Flu A&B, Covid) Nasopharyngeal Swab     Status: None   Collection Time: 08/02/20  2:32 AM   Specimen: Nasopharyngeal Swab; Nasopharyngeal(NP) swabs in vial transport medium  Result Value Ref Range Status   SARS Coronavirus 2 by RT PCR NEGATIVE NEGATIVE Final    Comment: (NOTE) SARS-CoV-2 target nucleic acids are NOT DETECTED.  The SARS-CoV-2 RNA is generally detectable in upper respiratory specimens during the acute phase of infection. The lowest concentration of SARS-CoV-2 viral copies this assay can detect is 138 copies/mL. A negative result does not preclude SARS-Cov-2 infection and should not be used as the sole basis for treatment or other patient management decisions. A negative result may occur with  improper specimen collection/handling, submission of specimen other than nasopharyngeal swab, presence of viral mutation(s) within the areas targeted by this assay, and inadequate number of viral copies(<138 copies/mL). A negative result must be combined with clinical observations, patient history, and epidemiological information. The expected result is Negative.  Fact Sheet for Patients:  BloggerCourse.com  Fact Sheet for Healthcare Providers:  SeriousBroker.it  This test is no t yet approved or cleared by the [place] FDA and  has been authorized for detection and/or diagnosis of SARS-CoV-2 by FDA under an Emergency Use Authorization (EUA). This EUA will remain  in effect (meaning this test can be used) for the duration of the COVID-19 declaration under Section 564(b)(1) of the Act, 21 U.S.C.section 360bbb-3(b)(1), unless the authorization is terminated  or revoked sooner.       Influenza A by PCR NEGATIVE NEGATIVE Final   Influenza B by PCR NEGATIVE NEGATIVE Final    Comment: (NOTE) The Xpert Xpress SARS-CoV-2/FLU/RSV plus assay is intended as an  aid in the diagnosis of influenza from Nasopharyngeal swab specimens and should not be used as a sole basis for treatment. Nasal washings and aspirates are unacceptable for Xpert Xpress SARS-CoV-2/FLU/RSV testing.  Fact Sheet for Patients: BloggerCourse.com  Fact Sheet for Healthcare Providers: SeriousBroker.it  This test is not yet approved or cleared by the [place] FDA and has been authorized for detection and/or diagnosis of SARS-CoV-2 by FDA under an Emergency Use Authorization (EUA). This EUA will remain in effect (meaning this test can be used) for the duration of the COVID-19 declaration under Section 564(b)(1) of the Act, 21 U.S.C. section 360bbb-3(b)(1), unless the authorization is terminated or revoked.  Performed at [facility], [address] [place], [place] [address]   Urine culture     Status: None   Collection Time: 08/02/20  5:40 AM   Specimen: In/Out Cath Urine  Result Value Ref Range Status   Specimen Description   Final    IN/OUT CATH URINE Performed at [facility], [address], [place], [place] [address]    Special Requests   Final    NONE Performed at [facility], [address], [place], [place] [address]    Culture   Final    NO GROWTH Performed at [facility], [address], [place], [place] [address]    Report Status 08/03/2020 FINAL  Final  MRSA PCR Screening     Status: None   Collection Time: 08/02/20  9:05 AM   Specimen: Nasopharyngeal  Result Value Ref Range Status   MRSA by PCR NEGATIVE NEGATIVE Final    Comment:        The GeneXpert MRSA Assay (FDA approved for NASAL specimens only), is one component of a comprehensive MRSA colonization surveillance program. It is not intended to diagnose MRSA infection nor to guide or monitor treatment for MRSA infections. Performed at [facility],  [address], [place], [place] [address]   Aerobic/Anaerobic Culture w Gram Stain (surgical/deep wound)     Status: None   Collection Time: 08/04/20  5:17 PM   Specimen: Wound  Result Value Ref Range Status   Specimen Description ABSCESS LEFT FOOT  Final   Special Requests NONE  Final   Gram Stain   Final    RARE WBC PRESENT,BOTH PMN AND MONONUCLEAR RARE GRAM POSITIVE COCCI IN PAIRS    Culture   Final    No growth aerobically or anaerobically. Performed at [facility], [address], [place], [place] [address]    Report Status 08/09/2020 FINAL  Final  Aerobic/Anaerobic Culture w Gram Stain (surgical/deep wound)     Status: None (Preliminary result)   Collection Time: 08/06/20 11:57 AM   Specimen: Soft Tissue, Other  Result Value Ref Range Status   Specimen Description   Final    WOUND LT FOOT Performed at [facility], [address], [place], [place] [address]    Special Requests   Final    NONE Performed at [facility], [address], [place], [place] [address]    Gram Stain   Final    RARE WBC PRESENT, PREDOMINANTLY PMN NO ORGANISMS SEEN    Culture   Final    NO GROWTH 4 DAYS NO ANAEROBES ISOLATED; CULTURE IN PROGRESS FOR 5 DAYS Performed at [facility], [address] [place], [place] [address]    Report Status PENDING  Incomplete    RADIOLOGY STUDIES/RESULTS: No results found.   LOS: 8 days   Jeoffrey Massed, MD  [facility]    To contact the attending provider between 7A-7P or the covering provider during after hours 7P-7A, please log into the web site [email or web address] and access using universal [place] password for that web site. If you do not have the password, please call the hospital operator.  08/10/2020, 12:17 PM

## 2020-08-10 NOTE — Progress Notes (Signed)
Mobility Specialist: Progress Note   08/10/20 1750  Mobility  Activity Transferred:  Chair to bed  Level of Assistance Minimal assist, patient does 75% or more  Assistive Device None  Mobility Response Tolerated well  Mobility performed by Mobility specialist;Nurse tech  $Mobility charge 1 Mobility   Pt c/o pain during ambulation but was fine after she got in the bed. Pt was minA using sliding board to get back in bed with assistance from NT. NT still present in room.   [facility] Eilis Chestnutt Mobility Specialist Mobility Specialist Phone: [phone]

## 2020-08-10 NOTE — Progress Notes (Signed)
Mobility Specialist: Progress Note   08/10/20 1708  Mobility  Activity Transferred:  Bed to chair  Assistive Device None  Mobility Response Tolerated well  Bed Position Chair  $Mobility charge 1 Mobility   Assisted PTA Carly in transferring pt from the bed to the chair. Will f/u later to transfer pt back to bed.   [facility] Tyrez Berrios Mobility Specialist Mobility Specialist Phone: [phone]

## 2020-08-10 NOTE — Progress Notes (Signed)
Orthopedic Tech Progress Note Patient Details:  Shirley Chen 14-May-1955 [id number] Put in order for Retention Sock with [employer]  Patient ID: [name], female   DOB: 02/16/1956, 65 y.o.   MRN: [id number]   [name] 08/10/2020, 8:49 AM

## 2020-08-10 NOTE — TOC Initial Note (Addendum)
Transition of Care [facility]) - Initial/Assessment Note    Patient Details  Name: Shirley Chen MRN: [id number] Date of Birth: Sep 15, 1955  Transition of Care [facility]) CM/SW Contact:    Vinie Sill, LCSW Phone Number: 08/10/2020, 2:53 PM  Clinical Narrative:        CSW met with patient at bedside. CSW introduced self and explained role. CSW discussed PT recommendnation of short term rehab at SNF   Patient states she lives in the home with her son in law (her daughter passes away about a year ago) and her grandchildren. She states she works 12 hr shifts alternating between day and night shifts. She states she is agreeable to rehab at [facility]. Preferred SNF is [facility] CSW wsa given permission to send referrals to other SNFs as back up to preferred SNF. She has not received covid vaccine and states she does not want one at this time. All questions answered.  CSW will provide bed offers once available.   CSW started insurance auth reference # [id number] CSW will continue to follow and assist with discharge planning.   Thurmond Butts, MSW, LCSW Clinical Social Worker         Expected Discharge Plan: Home w Home Health Services Barriers to Discharge: Continued Medical Work up   Patient Goals and CMS Choice Patient states their goals for this hospitalization and ongoing recovery are:: to go home CMS Medicare.gov Compare Post Acute Care list provided to:: Patient Choice offered to / list presented to : Patient  Expected Discharge Plan and Services Expected Discharge Plan: [place]   Discharge Planning Services: CM Consult   Living arrangements for the past 2 months: Apartment                                      Prior Living Arrangements/Services Living arrangements for the past 2 months: Apartment Lives with:: Self Patient language and need for interpreter reviewed:: Yes Do you feel safe going back to the place where you live?: Yes      Need for  Family Participation in Patient Care: No (Comment) Care giver support system in place?: No (comment)   Criminal Activity/Legal Involvement Pertinent to Current Situation/Hospitalization: No - Comment as needed  Activities of Daily Living Home Assistive Devices/Equipment: Walker (specify type) ADL Screening (condition at time of admission) Patient's cognitive ability adequate to safely complete daily activities?: Yes Is the patient deaf or have difficulty hearing?: No Does the patient have difficulty seeing, even when wearing glasses/contacts?: No Does the patient have difficulty concentrating, remembering, or making decisions?: No Patient able to express need for assistance with ADLs?: Yes Does the patient have difficulty dressing or bathing?: Yes Independently performs ADLs?: No Does the patient have difficulty walking or climbing stairs?: Yes Weakness of Legs: Both Weakness of Arms/Hands: None  Permission Sought/Granted                  Emotional Assessment Appearance:: Appears stated age Attitude/Demeanor/Rapport: Engaged Affect (typically observed): Calm Orientation: : Oriented to Place,Oriented to Self,Oriented to Situation,Oriented to  Time Alcohol / Substance Use: Not Applicable Psych Involvement: No (comment)  Admission diagnosis:  Hypoxia [R09.02] Acute respiratory failure with hypoxia ([place]) [J96.01] AKI (acute kidney injury) ([place]) [N17.9] Anemia, unspecified type [D64.9] Patient Active Problem List   Diagnosis Date Noted  . Necrotizing fasciitis ([place])   . Acute respiratory failure with hypoxia ([place])  08/02/2020  . AKI (acute kidney injury) ([place]) 08/02/2020  . Status post amputation of great toe, right ([place]) 03/11/2019  . Hyperlipidemia 06/16/2017  . Mononeuropathy due to underlying disease 02/12/2017  . Diabetic foot infection ([place])   . Type 2 diabetes mellitus ([place]) 11/27/2014  . PAD (peripheral artery disease) ([place]) 11/26/2014  . HTN (hypertension)  11/26/2014  . Diabetes mellitus with peripheral vascular disease ([place]) 11/26/2014  . Atherosclerosis of native arteries of the extremities with ulceration ([place]) 10/27/2014  . Tobacco abuse 10/20/2014   PCP:  Marrian Salvage, FNP Pharmacy:   [place], [place] - [address] [place] [place] [address] Phone: [phone] Fax: [place], [address], [address] [place], [place] [address] Phone: [phone] Fax: [phone]     Social Determinants of Health (SDOH) Interventions    Readmission Risk Interventions No flowsheet data found.

## 2020-08-10 NOTE — Progress Notes (Signed)
Physical Therapy Treatment Patient Details Name: Shirley Chen MRN: [id number] DOB: July 24, 1955 Today's Date: 08/10/2020    History of Present Illness Pt is 65 yo female who presented to ED on 08/01/20 with shortness of breath and admitted with acute resp failure with hypoxia - cause not clear.  Ddimer was elevated but dopplers and VQ scan were negative for clots. S/P I and D  L foot on  08/05/20. To ICU for respiratory distress. transferred to [facility] for Left BKA 5/4.   Pt with PMH of DM-2, HTN, CKD-3, PVD, HLD and tobacco abuse.    PT Comments    Pt received in supine, agreeable to therapy session and motivated to participate, with good tolerance for bed mobility and transfer training. Pt performed slide board transfer and other mobility tasks with min to modA (+2 present more for safety than physical assist) and pt with fair carryover of instructions given. Pt instructed on HEP and issued handout (link: [email or web address] Access Code: [email or web address]) and encouraged to perform TID as able. Pt continues to benefit from PT services to progress toward functional mobility goals. Continue to recommend SNF.  Follow Up Recommendations  SNF;Supervision for mobility/OOB     Equipment Recommendations  Other (comment);Wheelchair (measurements PT);Wheelchair cushion (measurements PT) ((all bariatric) tub bench, may need slide board (pt may have at home will check?), may need bariatric drop arm bedside commode)    Recommendations for Other Services       Precautions / Restrictions Precautions Precautions: Fall Precaution Comments: watch sats Restrictions Weight Bearing Restrictions: Yes LLE Weight Bearing: Non weight bearing    Mobility  Bed Mobility Overal bed mobility: Needs Assistance Bed Mobility: Rolling;Sidelying to Sit Rolling: Min guard;Min assist Sidelying to sit: Mod assist;+2 for safety/equipment       General bed mobility comments: log roll to L EOB, pt needing increased time to  perform and second staff member present for line mgmt/safety    Transfers Overall transfer level: Needs assistance Equipment used: Sliding board Transfers: Lateral/Scoot Transfers          Lateral/Scoot Transfers: +2 safety/equipment;Mod assist;With slide board General transfer comment: scooting to L side chair needs increased assist; pt needs step by step cues for use of slide board and for proper posture as she tends to lean backward; pt/nursing tech also given instruction on return transfer technique and it should be easier to return given she will be going to strong side (did not have room to flip chair to opposite side for initial transfer).  Ambulation/Gait             General Gait Details: Unable at present   Stairs             Wheelchair Mobility    Modified Rankin (Stroke Patients Only)       Balance Overall balance assessment: Needs assistance Sitting-balance support: No upper extremity supported Sitting balance-Leahy Scale: Fair Sitting balance - Comments: can sit EOB without physical assist without challenges                                    Cognition Arousal/Alertness: Awake/alert Behavior During Therapy: WFL for tasks assessed/performed Overall Cognitive Status: Within Functional Limits for tasks assessed Area of Impairment: Attention;Following commands;Safety/judgement;Awareness;Memory;Problem solving                   Current Attention Level: Selective   Following Commands: Follows multi-step commands with increased time;Follows  one step commands consistently Safety/Judgement: Decreased awareness of safety;Decreased awareness of deficits Awareness: Emergent Problem Solving: Difficulty sequencing;Requires verbal cues General Comments: Pt with fair carryover of instruction for transfers and therapist emphasized importance of her doing as much as she can to assist with functional mobility tasks/nsg care in order to  prepare for prosthetic/regain independence. Pt motivated to progress mobility but some decreased insight into deficits.      Exercises General Exercises - Lower Extremity Ankle Circles/Pumps: AROM;Right;10 reps;Seated Quad Sets: AROM;Both;10 reps;Seated (only trace L quad contraction, will need reinforcement) Gluteal Sets: AROM;Both;10 reps;Seated Long Arc Quad: AROM;Right;10 reps;Seated Hip ABduction/ADduction: AAROM;Left;5 reps;Seated Straight Leg Raises: AAROM;Left;Seated;5 reps Other Exercises Other Exercises: single leg bridges in supine x5 reps (also written on handout given to pt)    General Comments        Pertinent Vitals/Pain Pain Assessment: Faces Faces Pain Scale: Hurts a little bit Pain Location: L BKA Pain Descriptors / Indicators: Grimacing;Discomfort Pain Intervention(s): Monitored during session;Repositioned    Home Living                      Prior Function            PT Goals (current goals can now be found in the care plan section) Acute Rehab PT Goals Patient Stated Goal: get better, walk again PT Goal Formulation: With patient Time For Goal Achievement: 08/23/20 Potential to Achieve Goals: Good Progress towards PT goals: Progressing toward goals    Frequency    Min 3X/week      PT Plan Current plan remains appropriate    Co-evaluation              AM-PAC PT "6 Clicks" Mobility   Outcome Measure  Help needed turning from your back to your side while in a flat bed without using bedrails?: A Little Help needed moving from lying on your back to sitting on the side of a flat bed without using bedrails?: A Lot Help needed moving to and from a bed to a chair (including a wheelchair)?: A Lot Help needed standing up from a chair using your arms (e.g., wheelchair or bedside chair)?: Total Help needed to walk in hospital room?: Total Help needed climbing 3-5 steps with a railing? : Total 6 Click Score: 10    End of Session  Equipment Utilized During Treatment: Oxygen (0.5L O2 [place]) Activity Tolerance: Patient tolerated treatment well;Other (comment) (need to use bed pan limiting time for mobility but did not have BM) Patient left: with chair alarm set;in chair;with call bell/phone within reach Nurse Communication: Mobility status;Other (comment) (NT instructed on use of slide board for transfer) PT Visit Diagnosis: Other abnormalities of gait and mobility (R26.89);Pain     Time: 9518-8416 PT Time Calculation (min) (ACUTE ONLY): 51 min  Charges:  $Therapeutic Exercise: 8-22 mins $Therapeutic Activity: 23-37 mins                     Khyri Hinzman P., PTA Acute Rehabilitation Services Pager: [phone] Office: [phone]   [place] 08/10/2020, 6:30 PM

## 2020-08-10 NOTE — NC FL2 (Addendum)
[place] MEDICAID FL2 LEVEL OF CARE SCREENING TOOL     IDENTIFICATION  Patient Name: Shirley Chen Birthdate: [birth year] Sex: female Admission Date (Current Location): 08/01/2020  [facility] and [place] Number:  Producer, television/film/video and Address:  [facility]. [facility], [address], [place], [place] [address]      Provider Number: [id number]  Attending Physician Name and Address:  Maretta Bees, MD  Relative Name and Phone Number:       Current Level of Care: Hospital Recommended Level of Care: Skilled Nursing Facility Prior Approval Number:    Date Approved/Denied:   PASRR Number: [id number]  Discharge Plan: SNF    Current Diagnoses: Patient Active Problem List   Diagnosis Date Noted  . Necrotizing fasciitis (HCC)   . Acute respiratory failure with hypoxia (HCC) 08/02/2020  . AKI (acute kidney injury) (HCC) 08/02/2020  . Status post amputation of great toe, right (HCC) 03/11/2019  . Hyperlipidemia 06/16/2017  . Mononeuropathy due to underlying disease 02/12/2017  . Diabetic foot infection (HCC)   . Type 2 diabetes mellitus (HCC) 11/27/2014  . PAD (peripheral artery disease) (HCC) 11/26/2014  . HTN (hypertension) 11/26/2014  . Diabetes mellitus with peripheral vascular disease (HCC) 11/26/2014  . Atherosclerosis of native arteries of the extremities with ulceration (HCC) 10/27/2014  . Tobacco abuse 10/20/2014    Orientation RESPIRATION BLADDER Height & Weight     Self,Time,Situation,Place  O2 External catheter,Incontinent Weight: 217 lb 9.5 oz (98.7 kg) Height:  5\' 3"  (160 cm)  BEHAVIORAL SYMPTOMS/MOOD NEUROLOGICAL BOWEL NUTRITION STATUS      Continent Diet (please see discharge summary)  AMBULATORY STATUS COMMUNICATION OF NEEDS Skin   Supervision Verbally Surgical wounds (closed incision left foot, closed incision left leg)                       Personal Care Assistance Level of Assistance  Bathing,Feeding,Dressing Bathing  Assistance: Limited assistance Feeding assistance: Independent Dressing Assistance: Limited assistance     Functional Limitations Info  Sight,Hearing,Speech Sight Info: Impaired (wears eye glasses) Hearing Info: Adequate Speech Info: Adequate    SPECIAL CARE FACTORS FREQUENCY                       Contractures Contractures Info: Not present    Additional Factors Info  Code Status,Allergies Code Status Info: FULL Allergies Info: Losartan, morphine and related           Current Medications (08/10/2020):  This is the current hospital active medication list Current Facility-Administered Medications  Medication Dose Route Frequency Provider Last Rate Last Admin  . acetaminophen (TYLENOL) tablet 650 mg  650 mg Oral Q6H PRN 10/10/2020, PA-C   650 mg at 08/04/20 08/06/20   Or  . acetaminophen (TYLENOL) suppository 650 mg  650 mg Rectal Q6H PRN 7846, PA-C      . amLODipine (NORVASC) tablet 10 mg  10 mg Oral Daily Milinda Antis, PA-C   10 mg at 08/10/20 0847  . atorvastatin (LIPITOR) tablet 40 mg  40 mg Oral Daily 10/10/20, PA-C   40 mg at 08/10/20 0847  . carvedilol (COREG) tablet 25 mg  25 mg Oral BID WC 10/10/20, PA-C   25 mg at 08/10/20 10/10/20  . cefdinir (OMNICEF) capsule 300 mg  300 mg Oral Q12H Vu, Trung T, MD   300 mg at 08/10/20 0846  . Chlorhexidine Gluconate Cloth 2 % PADS  6 each  6 each Topical Daily Milinda Antis, PA-C   6 each at 08/08/20 [phone]  . colchicine tablet 0.6 mg  0.6 mg Oral Daily Milinda Antis, PA-C   0.6 mg at 08/10/20 0847  . ezetimibe (ZETIA) tablet 10 mg  10 mg Oral Daily Milinda Antis, PA-C   10 mg at 08/10/20 0847  . fentaNYL (SUBLIMAZE) injection 50-100 mcg  50-100 mcg Intravenous UD Milinda Antis, PA-C   50 mcg at 08/07/20 0459  . furosemide (LASIX) tablet 40 mg  40 mg Oral Daily Maretta Bees, MD   40 mg at 08/10/20 0847  . gabapentin (NEURONTIN) capsule 300 mg  300 mg Oral BID Milinda Antis, PA-C    300 mg at 08/10/20 0847  . heparin injection 5,000 Units  5,000 Units Subcutaneous Q8H Milinda Antis, PA-C   5,000 Units at 08/10/20 1354  . insulin aspart (novoLOG) injection 0-15 Units  0-15 Units Subcutaneous TID WC SetzerLynnell Jude, PA-C   5 Units at 08/10/20 1230  . insulin aspart (novoLOG) injection 0-5 Units  0-5 Units Subcutaneous QHS Milinda Antis, PA-C   3 Units at 08/09/20 2104  . insulin aspart (novoLOG) injection 10 Units  10 Units Subcutaneous TID WC Maretta Bees, MD   10 Units at 08/10/20 1354  . insulin glargine (LANTUS) injection 22 Units  22 Units Subcutaneous QHS Ghimire, Shanker M, MD      . labetalol (NORMODYNE) injection 10 mg  10 mg Intravenous Q2H PRN Milinda Antis, PA-C   10 mg at 08/07/20 1005  . linezolid (ZYVOX) IVPB 600 mg  600 mg Intravenous Q12H Milinda Antis, PA-C 300 mL/hr at 08/10/20 1052 600 mg at 08/10/20 1052  . MEDLINE mouth rinse  15 mL Mouth Rinse BID Milinda Antis, PA-C   15 mL at 08/09/20 2108  . morphine 2 MG/ML injection 2 mg  2 mg Intravenous Q4H PRN Setzer, Lynnell Jude, PA-C      . oxyCODONE-acetaminophen (PERCOCET) 7.5-325 MG per tablet 1 tablet  1 tablet Oral Q4H PRN Milinda Antis, PA-C   1 tablet at 08/10/20 1055  . spironolactone (ALDACTONE) tablet 25 mg  25 mg Oral Daily Milinda Antis, PA-C   25 mg at 08/10/20 0847  . traMADol (ULTRAM) tablet 50 mg  50 mg Oral Q12H PRN Milinda Antis, PA-C   50 mg at 08/09/20 2103     Discharge Medications: Please see discharge summary for a list of discharge medications.  Relevant Imaging Results:  Relevant Lab Results:   Additional Information [id number]  Eduard Roux, LCSW Patient has not received covid vaccine and does not want vaccine

## 2020-08-11 DIAGNOSIS — R110 Nausea: Secondary | ICD-10-CM | POA: Diagnosis not present

## 2020-08-11 DIAGNOSIS — I739 Peripheral vascular disease, unspecified: Secondary | ICD-10-CM | POA: Diagnosis not present

## 2020-08-11 DIAGNOSIS — I10 Essential (primary) hypertension: Secondary | ICD-10-CM

## 2020-08-11 DIAGNOSIS — E876 Hypokalemia: Secondary | ICD-10-CM | POA: Diagnosis not present

## 2020-08-11 DIAGNOSIS — M109 Gout, unspecified: Secondary | ICD-10-CM | POA: Diagnosis not present

## 2020-08-11 DIAGNOSIS — Z4802 Encounter for removal of sutures: Secondary | ICD-10-CM | POA: Diagnosis not present

## 2020-08-11 DIAGNOSIS — J9601 Acute respiratory failure with hypoxia: Secondary | ICD-10-CM | POA: Diagnosis not present

## 2020-08-11 DIAGNOSIS — E1122 Type 2 diabetes mellitus with diabetic chronic kidney disease: Secondary | ICD-10-CM | POA: Diagnosis not present

## 2020-08-11 DIAGNOSIS — U071 COVID-19: Secondary | ICD-10-CM | POA: Diagnosis not present

## 2020-08-11 DIAGNOSIS — L089 Local infection of the skin and subcutaneous tissue, unspecified: Secondary | ICD-10-CM | POA: Diagnosis not present

## 2020-08-11 DIAGNOSIS — M6281 Muscle weakness (generalized): Secondary | ICD-10-CM | POA: Diagnosis not present

## 2020-08-11 DIAGNOSIS — Z4781 Encounter for orthopedic aftercare following surgical amputation: Secondary | ICD-10-CM | POA: Diagnosis not present

## 2020-08-11 DIAGNOSIS — E1159 Type 2 diabetes mellitus with other circulatory complications: Secondary | ICD-10-CM | POA: Diagnosis not present

## 2020-08-11 DIAGNOSIS — Z89512 Acquired absence of left leg below knee: Secondary | ICD-10-CM | POA: Diagnosis not present

## 2020-08-11 DIAGNOSIS — E785 Hyperlipidemia, unspecified: Secondary | ICD-10-CM | POA: Diagnosis not present

## 2020-08-11 DIAGNOSIS — R600 Localized edema: Secondary | ICD-10-CM | POA: Diagnosis not present

## 2020-08-11 DIAGNOSIS — M726 Necrotizing fasciitis: Secondary | ICD-10-CM | POA: Diagnosis not present

## 2020-08-11 DIAGNOSIS — Z720 Tobacco use: Secondary | ICD-10-CM | POA: Diagnosis not present

## 2020-08-11 DIAGNOSIS — N179 Acute kidney failure, unspecified: Secondary | ICD-10-CM | POA: Diagnosis not present

## 2020-08-11 DIAGNOSIS — Z8616 Personal history of COVID-19: Secondary | ICD-10-CM | POA: Diagnosis not present

## 2020-08-11 DIAGNOSIS — Z7401 Bed confinement status: Secondary | ICD-10-CM | POA: Diagnosis not present

## 2020-08-11 DIAGNOSIS — D649 Anemia, unspecified: Secondary | ICD-10-CM | POA: Diagnosis not present

## 2020-08-11 DIAGNOSIS — E1151 Type 2 diabetes mellitus with diabetic peripheral angiopathy without gangrene: Secondary | ICD-10-CM | POA: Diagnosis not present

## 2020-08-11 DIAGNOSIS — E11628 Type 2 diabetes mellitus with other skin complications: Secondary | ICD-10-CM | POA: Diagnosis not present

## 2020-08-11 DIAGNOSIS — I5032 Chronic diastolic (congestive) heart failure: Secondary | ICD-10-CM | POA: Diagnosis not present

## 2020-08-11 DIAGNOSIS — G8929 Other chronic pain: Secondary | ICD-10-CM | POA: Diagnosis not present

## 2020-08-11 DIAGNOSIS — Z89411 Acquired absence of right great toe: Secondary | ICD-10-CM | POA: Diagnosis not present

## 2020-08-11 DIAGNOSIS — M79604 Pain in right leg: Secondary | ICD-10-CM | POA: Diagnosis not present

## 2020-08-11 DIAGNOSIS — R5381 Other malaise: Secondary | ICD-10-CM | POA: Diagnosis not present

## 2020-08-11 DIAGNOSIS — M2550 Pain in unspecified joint: Secondary | ICD-10-CM | POA: Diagnosis not present

## 2020-08-11 DIAGNOSIS — N1832 Chronic kidney disease, stage 3b: Secondary | ICD-10-CM | POA: Diagnosis not present

## 2020-08-11 DIAGNOSIS — Z743 Need for continuous supervision: Secondary | ICD-10-CM | POA: Diagnosis not present

## 2020-08-11 LAB — BASIC METABOLIC PANEL
Anion gap: 8 (ref 5–15)
BUN: 25 mg/dL — ABNORMAL HIGH (ref 8–23)
CO2: 33 mmol/L — ABNORMAL HIGH (ref 22–32)
Calcium: 7.9 mg/dL — ABNORMAL LOW (ref 8.9–10.3)
Chloride: 97 mmol/L — ABNORMAL LOW (ref 98–111)
Creatinine, Ser: 1.34 mg/dL — ABNORMAL HIGH (ref 0.44–1.00)
GFR, Estimated: 45 mL/min — ABNORMAL LOW (ref >60)
Glucose, Bld: 287 mg/dL — ABNORMAL HIGH (ref 70–99)
Potassium: 3.8 mmol/L (ref 3.5–5.1)
Sodium: 138 mmol/L (ref 135–145)

## 2020-08-11 LAB — GLUCOSE, CAPILLARY
Glucose-Capillary: 298 mg/dL — ABNORMAL HIGH (ref 70–99)
Glucose-Capillary: 318 mg/dL — ABNORMAL HIGH (ref 70–99)

## 2020-08-11 LAB — AEROBIC/ANAEROBIC CULTURE W GRAM STAIN (SURGICAL/DEEP WOUND): Culture: NO GROWTH

## 2020-08-11 LAB — MAGNESIUM: Magnesium: 2.3 mg/dL (ref 1.7–2.4)

## 2020-08-11 MED ORDER — POTASSIUM CHLORIDE CRYS ER 20 MEQ PO TBCR: 20.0000 meq | EXTENDED_RELEASE_TABLET | Freq: Every day | ORAL | Status: DC

## 2020-08-11 MED ORDER — CEFDINIR 300 MG PO CAPS: 300.0000 mg | ORAL_CAPSULE | Freq: Two times a day (BID) | ORAL | 0 refills | Status: AC

## 2020-08-11 MED ORDER — HYDRALAZINE HCL 50 MG PO TABS: 50.0000 mg | ORAL_TABLET | Freq: Three times a day (TID) | ORAL | 3 refills | Status: DC

## 2020-08-11 MED ORDER — INSULIN ASPART 100 UNIT/ML IJ SOLN
14.0000 [IU] | Freq: Three times a day (TID) | INTRAMUSCULAR | Status: DC
  Administered 2020-08-11: 14 [IU] via SUBCUTANEOUS

## 2020-08-11 MED ORDER — LINEZOLID 600 MG PO TABS: 600.0000 mg | ORAL_TABLET | Freq: Two times a day (BID) | ORAL | 0 refills | Status: AC

## 2020-08-11 MED ORDER — SPIRONOLACTONE 25 MG PO TABS: 25.0000 mg | ORAL_TABLET | Freq: Every day | ORAL | Status: DC

## 2020-08-11 MED ORDER — INSULIN ASPART 100 UNIT/ML IJ SOLN: 14.0000 [IU] | Freq: Three times a day (TID) | INTRAMUSCULAR | 11 refills | Status: DC

## 2020-08-11 MED ORDER — OXYCODONE-ACETAMINOPHEN 7.5-325 MG PO TABS: 1.0000 | ORAL_TABLET | Freq: Four times a day (QID) | ORAL | 0 refills | Status: DC | PRN

## 2020-08-11 MED ORDER — LINEZOLID 600 MG PO TABS
600.0000 mg | ORAL_TABLET | Freq: Two times a day (BID) | ORAL | Status: DC
  Filled 2020-08-11: qty 1

## 2020-08-11 MED ORDER — INSULIN GLARGINE 100 UNIT/ML ~~LOC~~ SOLN
26.0000 [IU] | Freq: Every day | SUBCUTANEOUS | Status: DC
  Filled 2020-08-11: qty 0.26

## 2020-08-11 MED ORDER — FUROSEMIDE 40 MG PO TABS: 40.0000 mg | ORAL_TABLET | Freq: Every day | ORAL | Status: DC

## 2020-08-11 MED ORDER — INSULIN ASPART 100 UNIT/ML IJ SOLN: INTRAMUSCULAR | 11 refills | Status: DC

## 2020-08-11 MED ORDER — INSULIN GLARGINE 100 UNIT/ML ~~LOC~~ SOLN: 26.0000 [IU] | Freq: Every day | SUBCUTANEOUS | 11 refills | Status: DC

## 2020-08-11 MED ORDER — COLCHICINE 0.6 MG PO TABS
0.6000 mg | ORAL_TABLET | Freq: Every day | ORAL | Status: DC
Start: 2020-08-12 — End: 2021-03-27

## 2020-08-11 NOTE — Discharge Summary (Signed)
PATIENT DETAILS Name: Shirley Chen Age: 65 y.o. Sex: female Date of Birth: [birth year] MRN: [id number]. Admitting Physician: Eduard Clos, MD PEH:[id number], Allyne Gee, FNP  Admit Date: 08/01/2020 Discharge date: 08/11/2020  Recommendations for Outpatient Follow-up:  1. Follow up with PCP in 1-2 weeks 2. Please obtain CMP/CBC in one week 3. Antibiotics stop date 5/9 4. Please ensure follow-up with vascular surgery. 5. Consider outpatient nephrology follow-up for CKD and proteinuria. 6. Please ensure outpatient follow-up with neurosurgery.  Admitted From:  Home  Disposition: SNF   Home Health: No  Equipment/Devices: None  Discharge Condition: Stable  CODE STATUS: FULL CODE  Diet recommendation:  Diet Order            Diet - low sodium heart healthy           Diet Carb Modified           Diet Carb Modified Fluid consistency: Thin; Room service appropriate? Yes  Diet effective now                  Brief Narrative: Patient is a 65 y.o. female DM-2, HTN, CKD stage IIIb, HLD, PAD, tobacco abuse-who presented with shortness of breath, lower extremity edema-with worsening left foot swelling/erythema.  She was thought to have acute hypoxic respiratory failure due to a combination of decompensated heart failure and PNA-and noted to have a abscess on her foot-subsequently evaluated by podiatry-and underwent I&D.  Unfortunately-she continued to have signs and symptoms suggestive of critical limb ischemia-subsequently vascular surgery evaluation was obtained-with recommendations to transfer patient to [facility] for possible arteriogram/BKA.  See below for further details.  Significant events: 4/27>> presenting with shortness of breath- hypoxia due to CHF/PNA-and left foot infection.  Admit to [facility]. 5/2>> irrigation/debridement of left foot wound 5/3>> transfer to [facility] for possible left BKA.   Significant studies: 4/28>> CT chest: No evidence of active pulmonary  disease, calcified granulomas in the left lung/left hilum. 4/28>> lower extremity Doppler: No DVT 4/28>> VQ scan: No findings indicative of PE. 4/29>> renal ultrasound: No hydronephrosis 4/30>> CT head: No acute abnormalities. 4/30>> MRI left foot: Severe soft tissue edema-with air in the dorsal aspect of the forefoot-edema at the base of third proximal phalanx-reactive bone marrow versus early osteomyelitis 4/30>> Echo: EF 60-65%, grade 2 diastolic dysfunction. 5/2>> ABI: Right-normal range, left ankle brachial index-moderate left lower extremity arterial disease  Antimicrobial therapy: Vancomycin: 4/27 x 1 Flagyl: 4/27>> 5/3 Cefepime: 4/27>> 5/3 Zithromax: 4/27>> 5/3 Zyvox: 4/30>> Cefdinir: 5/4>>  Microbiology data: 4/30>> swab-abscess left foot: No growth 5/2>> left foot wound/intraoperative cultures: No growth  Procedures : 4/30>> left foot irrigation/debridement by podiatry 5/2>> left foot irrigation/debridement by podiatry 5/4>> left BKA  Consults: ID, vascular surgery, podiatry   Brief Hospital Course: Acute hypoxic respiratory failure: Felt to be due to a combination of CAP/HFpEF exacerbation.  Improved-attempt to titrate off oxygen.  Remains on antimicrobial therapy and diuretics.  Severe sepsis with left foot necrotizing infection/abscess with critical left leg ischemia-s/p left BKA on 5/4: Sepsis physiology has resolved-transitioned to oral antimicrobial therapy by ID on 5/4-stop date of 5/9.    Vascular surgery followed closely.  Vascular surgery will arrange for follow-up in 4-5 weeks for staple removal.  Acute metabolic encephalopathy: Due to hypoxemia/sepsis-resolved.  She is completely awake and alert today.  HFpEF with exacerbation: Volume status has improved-treated with IV Lasix-currently stable on oral Lasix and Aldactone.  Continue to monitor closely.    Hypokalemia/hypomagnesemia: Repleted-recheck electrolytes in 1 week.  AKI on CKD stage IIIb:  AKI likely hemodynamically mediated-improving with supportive care.  Renal ultrasound negative for hydronephrosis.  Has significant proteinuria-and probably has underlying diabetic nephropathy.  Please ensure outpatient follow-up with nephrology.  HTN: BP stable-continue amlodipine, Coreg, Lasix, Aldactone-follow and adjust  HLD: Continue statin  DM-2 (A1c 8.0 on 3/17): CBGs overall improved but they remain on the higher side-on discharge will increase Lantus to 26 units daily, increase Premeal NovoLog to 14 units-and continue with SSI.   History of PAD: Vascular surgery following  Normocytic anemia: Due to acute illness-probably has some amount of anemia due to CKD.  Did require 1 unit of PRBC transfusion on 4/30.  Chronic back pain: Recent MRI with disc extrusion at L4-L5-physical exam is benign-we will need outpatient follow-up with neurosurgery.  Gout: No flare-continue colchicine  Tobacco use: We will need counseling over the next few days.   Obesity: Estimated body mass index is 38.55 kg/m as calculated from the following:   Height as of this encounter: $RemoveBeforeD'5\' 3"'SxalQIIbdaoYdA$  (1.6 m).   Weight as of this encounter: 98.7 kg.    Discharge Diagnoses:  Principal Problem:   Acute respiratory failure with hypoxia (HCC) Active Problems:   Tobacco abuse   PAD (peripheral artery disease) (HCC)   HTN (hypertension)   Diabetes mellitus with peripheral vascular disease ([place])   Diabetic foot infection ([place])   AKI (acute kidney injury) ([place])   Necrotizing fasciitis ([place])   Discharge Instructions:  Activity:  As tolerated with Full fall precautions use walker/cane & assistance as needed  Discharge Instructions    Call MD for:  redness, tenderness, or signs of infection (pain, swelling, redness, odor or green/yellow discharge around incision site)   Complete by: As directed    Diet - low sodium heart healthy   Complete by: As directed    Diet Carb Modified   Complete by: As directed     Increase activity slowly   Complete by: As directed    No dressing needed   Complete by: As directed      Allergies as of 08/11/2020      Reactions   Losartan Nausea And Vomiting   Morphine And Related    Pt states " I just got really sick"      Medication List    STOP taking these medications   Insulin Lispro Prot & Lispro (75-25) 100 UNIT/ML Kwikpen Commonly known as: HumaLOG Mix 75/25 KwikPen   lisinopril 10 MG tablet Commonly known as: ZESTRIL   RELION PEN NEEDLE 31G/8MM 31G X 8 MM Misc Generic drug: Insulin Pen Needle     TAKE these medications   Accu-Chek Guide test strip Generic drug: glucose blood USE 1 STRIP TO CHECK BLOOD  GLUCOSE 3 TIMES DAILY   amLODipine 10 MG tablet Commonly known as: NORVASC TAKE 1 TABLET BY MOUTH  DAILY   atorvastatin 40 MG tablet Commonly known as: LIPITOR TAKE 1 TABLET BY MOUTH  DAILY   blood glucose meter kit and supplies Kit Dispense based on patient and insurance preference. Use up to four times daily as directed. (FOR ICD-9 250.00, 250.01).   blood glucose meter kit and supplies Kit Dispense based on patient and insurance preference. Use up to four times daily as directed. (FOR ICD-9 250.00, 250.01).   carvedilol 25 MG tablet Commonly known as: COREG TAKE 1 TABLET BY MOUTH  TWICE DAILY WITH A MEAL   cefdinir 300 MG capsule Commonly known as: OMNICEF Take 1 capsule (300 mg total)  by mouth every 12 (twelve) hours for 2 days. Stop date on 5/9   colchicine 0.6 MG tablet Take 1 tablet (0.6 mg total) by mouth daily. Start taking on: Aug 12, 2020   ezetimibe 10 MG tablet Commonly known as: ZETIA TAKE 1 TABLET BY MOUTH  DAILY   furosemide 40 MG tablet Commonly known as: LASIX Take 1 tablet (40 mg total) by mouth daily. Start taking on: Aug 12, 2020   gabapentin 100 MG capsule Commonly known as: NEURONTIN TAKE 3 CAPSULES BY MOUTH  TWICE DAILY   glucose monitoring kit monitoring kit 1 each by Does not apply route 4 (four)  times daily - after meals and at bedtime. 1 month Diabetic Testing Supplies for QAC-QHS accuchecks. Any brand OK. Diagnosis E11.65   hydrALAZINE 50 MG tablet Commonly known as: APRESOLINE Take 1 tablet (50 mg total) by mouth 3 (three) times daily. What changed:   how much to take  how to take this  when to take this  additional instructions   insulin aspart 100 UNIT/ML injection Commonly known as: novoLOG 0-15 Units, Subcutaneous, 3 times daily with meals CBG < 70: Implement Hypoglycemia measures CBG 70 - 120: 0 units CBG 121 - 150: 2 units CBG 151 - 200: 3 units CBG 201 - 250: 5 units CBG 251 - 300: 8 units CBG 301 - 350: 11 units CBG 351 - 400: 15 units CBG > 400: call MD   insulin aspart 100 UNIT/ML injection Commonly known as: novoLOG Inject 14 Units into the skin 3 (three) times daily with meals.   insulin glargine 100 UNIT/ML injection Commonly known as: LANTUS Inject 0.26 mLs (26 Units total) into the skin at bedtime.   linezolid 600 MG tablet Commonly known as: ZYVOX Take 1 tablet (600 mg total) by mouth every 12 (twelve) hours for 2 days. Stop date 5/9   metFORMIN 500 MG tablet Commonly known as: GLUCOPHAGE Take 1 tablet (500 mg total) by mouth 2 (two) times daily with a meal.   oxyCODONE-acetaminophen 7.5-325 MG tablet Commonly known as: PERCOCET Take 1 tablet by mouth every 6 (six) hours as needed for moderate pain.   potassium chloride SA 20 MEQ tablet Commonly known as: KLOR-CON Take 1 tablet (20 mEq total) by mouth daily.   spironolactone 25 MG tablet Commonly known as: ALDACTONE Take 1 tablet (25 mg total) by mouth daily. Start taking on: Aug 12, 2020            Discharge Care Instructions  (From admission, onward)         Start     Ordered   08/11/20 0000  No dressing needed        08/11/20 1025          Follow-up Information    [facility] In 4 weeks.   Specialty: Vascular Surgery Why: Office  will call you to arrange your appt (sent) Contact information: [address] [place], [place], [place]. Schedule an appointment as soon as possible for a visit in 1 week(s).   Specialty: Internal Medicine Contact information: [place] [address] [phone]              Allergies  Allergen Reactions  . Losartan Nausea And Vomiting  . Morphine And Related     Pt states " I just got really sick"      Other Procedures/Studies: DG Chest 2  View  Result Date: 08/03/2020 CLINICAL DATA:  Respiratory failure. EXAM: CHEST - 2 VIEW COMPARISON:  CT chest and chest x-ray from yesterday. FINDINGS: The heart size and mediastinal contours are within normal limits. Normal pulmonary vascularity. Mild bibasilar atelectasis. No focal consolidation, pleural effusion, or pneumothorax. No acute osseous abnormality. IMPRESSION: 1. Mild bibasilar atelectasis. Electronically Signed   By: Titus Dubin M.D.   On: 08/03/2020 11:32   CT HEAD WO CONTRAST  Result Date: 08/04/2020 CLINICAL DATA:  65 year old female with unexplained altered mental status. EXAM: CT HEAD WITHOUT CONTRAST TECHNIQUE: Contiguous axial images were obtained from the base of the skull through the vertex without intravenous contrast. COMPARISON:  None. FINDINGS: Brain: Cerebral volume is within normal limits for age. No midline shift, ventriculomegaly, mass effect, evidence of mass lesion, intracranial hemorrhage or evidence of cortically based acute infarction. Small hypodensity at the lateral left thalamus near the posterior limb left internal capsule on series 10, image 32. Elsewhere gray-white matter differentiation is symmetric and within normal limits for age. No cortical encephalomalacia identified. Vascular: Calcified atherosclerosis at the skull base. No suspicious intracranial vascular hyperdensity. Skull: No acute osseous abnormality identified.  Sinuses/Orbits: Visualized paranasal sinuses and mastoids are clear. Other: No acute orbit or scalp soft tissue finding. IMPRESSION: 1. Suspected small age indeterminate lacunar infarct in the lateral left thalamus, less likely a perivascular space (normal variant). Query right sided symptoms. 2. Otherwise normal for age non contrast CT appearance of the brain. Electronically Signed   By: Genevie Ann M.D.   On: 08/04/2020 06:04   CT Chest Wo Contrast  Result Date: 08/02/2020 CLINICAL DATA:  Respiratory distress with shortness of breath. Nondiagnostic x-ray. EXAM: CT CHEST WITHOUT CONTRAST TECHNIQUE: Multidetector CT imaging of the chest was performed following the standard protocol without IV contrast. COMPARISON:  Chest radiograph 08/02/2020 FINDINGS: Cardiovascular: Lack of IV contrast material limits evaluation. Normal heart size. No pericardial effusions. Mild to moderate coronary artery and aortic calcification. No aortic aneurysm. Mediastinum/Nodes: Esophagus is decompressed. No significant lymphadenopathy in the chest. Thyroid gland is unremarkable. Lungs/Pleura: Calcified granulomas in the left lung and left hilum. No airspace disease or consolidation in the lungs. No pleural effusions. No pneumothorax. Airways are patent. Upper Abdomen: Calcified granulomas in the spleen. Vascular calcifications. No acute abnormalities demonstrated in the visualized upper abdomen. Musculoskeletal: Mild degenerative changes in the spine. No destructive bone lesions. IMPRESSION: 1. No evidence of active pulmonary disease. Calcified granulomas in the left lung and left hilum. 2. Aortic atherosclerosis. Aortic Atherosclerosis (ICD10-I70.0). Electronically Signed   By: Lucienne Capers M.D.   On: 08/02/2020 03:00   MR THORACIC SPINE W WO CONTRAST  Result Date: 07/30/2020 CLINICAL DATA:  65 year old female with fever. Back pain radiating to the left leg. EXAM: MRI THORACIC WITHOUT AND WITH CONTRAST TECHNIQUE: Multiplanar and  multiecho pulse sequences of the thoracic spine were obtained without and with intravenous contrast. CONTRAST:  63mL GADAVIST GADOBUTROL 1 MMOL/ML IV SOLN COMPARISON:  None. FINDINGS: Limited cervical spine imaging: Negative aside from straightening of lordosis. Thoracic spine segmentation:  Appears to be normal. Alignment:  Preserved thoracic kyphosis.  No spondylolisthesis. Vertebrae: Chronic T11 superior endplate compression or Schmorl's node. Only mild loss of T11 vertebral body height. Normal thoracic vertebral height elsewhere. Normal background bone marrow signal. No marrow edema or evidence of acute osseous abnormality. Cord: Thoracic spinal cord appears to be normal. Conus medullaris is below T12-L1. No abnormal intradural enhancement or dural thickening is identified. Paraspinal and other soft tissues: Negative  aside from a possible 4 cm lipoma in the posterior paraspinal soft tissues at T11-T12 (series 24, image 36). Disc levels: Axial images are degraded by motion despite repeated imaging attempts. In general there is mild thoracic spine degeneration, notable for a small central disc protrusion at T5-T6 on series 23, image 17. No associated stenosis. IMPRESSION: 1. Mild for age thoracic spine degeneration and no acute or inflammatory process identified. 2. Mild chronic T11 Schmorl's node or compression fracture. Tiny disc herniation at T5-T6 with no neural impingement. Electronically Signed   By: Genevie Ann M.D.   On: 07/30/2020 06:33   MR Lumbar Spine W Wo Contrast  Result Date: 07/30/2020 CLINICAL DATA:  65 year old female with fever. Back pain radiating to the left leg. EXAM: MRI LUMBAR SPINE WITHOUT AND WITH CONTRAST TECHNIQUE: Multiplanar and multiecho pulse sequences of the lumbar spine were obtained without and with intravenous contrast. CONTRAST:  70mL GADAVIST GADOBUTROL 1 MMOL/ML IV SOLN in conjunction with contrast enhanced imaging of the thoracic spine reported separately. COMPARISON:   Thoracic spine MRI today reported separately. FINDINGS: Segmentation: Normal, concordant with the thoracic spine numbering today. Alignment: Mild straightening of lumbar lordosis. No spondylolisthesis. Vertebrae: No marrow edema or evidence of acute osseous abnormality. Visualized bone marrow signal is within normal limits. Intact visible sacrum and SI joints. Conus medullaris and cauda equina: Conus extends to the L1 level. No lower spinal cord or conus signal abnormality. Proximal cauda equina nerve roots are normal. No abnormal intradural enhancement. No dural thickening identified. Paraspinal and other soft tissues: Negative. Disc levels: T12-L1:  Negative. L1-L2:  Negative. L2-L3: Mild disc desiccation and circumferential disc bulge. Mild facet hypertrophy. Mild left L2 foraminal stenosis. L3-L4: Mild disc desiccation and circumferential disc bulge. Mild posterior element hypertrophy. Borderline to mild bilateral L3 neural foraminal stenosis. L4-L5: Disc desiccation, disc space loss and vacuum disc. Circumferential disc bulge with superimposed moderate sized disc extrusion into the right lateral recess (series 1, image 7 and series 4, image 24). Rim enhancement of the herniated disc (series 6, image 7). Superimposed mild posterior element hypertrophy. Severe right lateral recess stenosis (descending right L5 nerve level). Mild overall spinal stenosis. Moderate bilateral multifactorial L4 neural foraminal stenosis. L5-S1:  Mild disc bulging and endplate spurring.  No stenosis. IMPRESSION: 1. Moderate-sized rightward disc extrusion at L4-L5 with severe lateral recess stenosis. Query Right L5 radiculitis. Mild overall spinal stenosis, and moderate multifactorial bilateral L4 neural foraminal stenosis at that level. 2. No other acute or inflammatory process. Mild for age lumbar spine degeneration elsewhere. Electronically Signed   By: Genevie Ann M.D.   On: 07/30/2020 06:38   NM Pulmonary Perfusion  Result Date:  08/02/2020 CLINICAL DATA:  Shortness of breath EXAM: NUCLEAR MEDICINE PERFUSION LUNG SCAN TECHNIQUE: Perfusion images were obtained in multiple projections after intravenous injection of radiopharmaceutical. Views: Anterior, posterior, left lateral, right lateral, RPO, LPO, RAO, LAO. RADIOPHARMACEUTICALS:  4.4 mCi Tc-76m MAA IV COMPARISON:  Chest radiograph and chest CT August 02, 2020 FINDINGS: Radiotracer uptake is homogeneous and symmetric bilaterally. No evident perfusion defects. IMPRESSION: No evident perfusion defects. No findings indicative of pulmonary embolus. Electronically Signed   By: Lowella Grip III M.D.   On: 08/02/2020 12:23   US RENAL  Result Date: 08/03/2020 CLINICAL DATA:  Acute kidney injury. EXAM: RENAL / URINARY TRACT ULTRASOUND COMPLETE COMPARISON:  November 29, 2014. FINDINGS: Right Kidney: Renal measurements: 12.1 x 6.0 x 6.0 cm = volume: 225 mL. 2.7 cm simple cyst is noted. Echogenicity within normal  limits. No mass or hydronephrosis visualized. Left Kidney: Renal measurements: 11.5 x 6.0 x 5.3 cm = volume: 191 mL. Echogenicity within normal limits. No mass or hydronephrosis visualized. Bladder: Appears normal for degree of bladder distention. Other: None. IMPRESSION: No significant renal abnormality is noted. Electronically Signed   By: Marijo Conception M.D.   On: 08/03/2020 16:24   MR FOOT LEFT WO CONTRAST  Result Date: 08/04/2020 CLINICAL DATA:  Left foot swelling. EXAM: MRI OF THE LEFT FOOT WITHOUT CONTRAST TECHNIQUE: Multiplanar, multisequence MR imaging of the left foot was performed. No intravenous contrast was administered. COMPARISON:  None. FINDINGS: Large field of view limits evaluation. Bones/Joint/Cartilage No fracture or dislocation. Normal alignment. No joint effusion. No periosteal reaction or bone destruction. Minimal marrow edema at the base of the third proximal phalanx. Ligaments Collateral ligaments are intact.  Lisfranc ligament is intact. Muscles and Tendons  Flexor, peroneal and extensor compartment tendons are intact. Generalized T2 hyperintense signal throughout the plantar musculature which may be neurogenic versus secondary to myositis. Soft tissue Severe soft tissue edema throughout the left foot. Low signal foci in the dorsal aspect of forefoot consistent with air which tracks proximally to the level of the TMT joints. Small amount air seen along the plantar aspect of the foot along the second MTP joint. No drainable fluid collection to suggest an abscess. IMPRESSION: 1. Severe soft edema throughout the foot with air in the dorsal aspect of the forefoot tracking proximally to the level of the TMT joints most concerning for necrotizing infection. 2. Minimal marrow edema at the base of the third proximal phalanx without cortical destruction may reflect reactive marrow edema versus early osteomyelitis. Electronically Signed   By: Kathreen Devoid   On: 08/04/2020 11:55   DG CHEST PORT 1 VIEW  Result Date: 08/05/2020 CLINICAL DATA:  Shortness of breath EXAM: PORTABLE CHEST 1 VIEW COMPARISON:  08/03/2020 radiograph and CT FINDINGS: Increasing hazy opacities are present throughout the right hemithorax and to a lesser extent the left lower lung. Could reflect worsening atelectasis or developing edema. Calcified granulomata again seen in the left lung. Few calcified lymph nodes are noted as well. Stable cardiomediastinal contours. No acute osseous or soft tissue abnormality. Telemetry leads overlie the chest. IMPRESSION: Some increasing hazy opacities particularly through the right hemithorax and bilateral lung bases. Could reflect worsening atelectasis, developing airspace disease or atypical infection. Sequela of prior granulomatous disease. Electronically Signed   By: Lovena Le M.D.   On: 08/05/2020 04:25   DG Chest Port 1 View  Result Date: 08/02/2020 CLINICAL DATA:  Dyspnea EXAM: PORTABLE CHEST 1 VIEW COMPARISON:  07/30/2020 FINDINGS: The heart size and  mediastinal contours are within normal limits. Both lungs are clear. The visualized skeletal structures are unremarkable. IMPRESSION: No active disease. Electronically Signed   By: Fidela Salisbury MD   On: 08/02/2020 01:35   DG Chest Port 1 View  Result Date: 07/30/2020 CLINICAL DATA:  Fever EXAM: PORTABLE CHEST 1 VIEW COMPARISON:  None. FINDINGS: Patient is rotated. Mild cardiac enlargement with some vascular congestion. No edema or consolidation. No pleural effusions. No pneumothorax. Mediastinal contours appear intact. IMPRESSION: Cardiac enlargement with mild vascular congestion. No edema or consolidation. Electronically Signed   By: Lucienne Capers M.D.   On: 07/30/2020 02:49   VAS [place] ABI WITH/WO TBI  Result Date: 08/06/2020  LOWER EXTREMITY DOPPLER STUDY Patient Name:  SHACOLA SCHUSSLER  Date of Exam:   08/06/2020 Medical Rec #: [id number]  Accession #:    [id number] Date of Birth: 1956-01-28       Patient Gender: F Patient Age:   64Y Exam Location:  [facility] Procedure:      VAS [place] ABI WITH/WO TBI Referring Phys: [id number] KEVIN P PATEL --------------------------------------------------------------------------------  Indications: Peripheral artery disease, and Right great toe amputation. High Risk Factors: Hypertension, hyperlipidemia, current smoker. Other Factors: 08/06/2020 - IRRIGATION AND DEBRIDEMENT LEFT FOOT WOUND.  Limitations: Today's exam was limited due to patient intolerant to cuff              pressure, an open wound and bandages. Comparison Study: No prior studies. Performing Technologist: Carlos Levering RVT  Examination Guidelines: A complete evaluation includes at minimum, Doppler waveform signals and systolic blood pressure reading at the level of bilateral brachial, anterior tibial, and posterior tibial arteries, when vessel segments are accessible. Bilateral testing is considered an integral part of a complete examination. Photoelectric Plethysmograph (PPG) waveforms and toe  systolic pressure readings are included as required and additional duplex testing as needed. Limited examinations for reoccurring indications may be performed as noted.  ABI Findings: +--------+------------------+-----+---------+--------+ Right   Rt Pressure (mmHg)IndexWaveform Comment  +--------+------------------+-----+---------+--------+ [id number]                    triphasic         +--------+------------------+-----+---------+--------+ PTA     155               0.82 biphasic          +--------+------------------+-----+---------+--------+ DP      167               0.88 triphasic         +--------+------------------+-----+---------+--------+ +--------+------------------+-----+----------+-------+ Left    Lt Pressure (mmHg)IndexWaveform  Comment +--------+------------------+-----+----------+-------+ [id number]                    triphasic         +--------+------------------+-----+----------+-------+ PTA     123               0.65 monophasic        +--------+------------------+-----+----------+-------+ DP      111               0.59 monophasic        +--------+------------------+-----+----------+-------+ +-------+-----------+-----------+------------+------------+ ABI/TBIToday's ABIToday's TBIPrevious ABIPrevious TBI +-------+-----------+-----------+------------+------------+ Right  1.04                                           +-------+-----------+-----------+------------+------------+ Left   0.65                                           +-------+-----------+-----------+------------+------------+  Summary: Right: Resting right ankle-brachial index is within normal range. No evidence of significant right lower extremity arterial disease. 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 Left: Resting left ankle-brachial index indicates moderate left lower extremity arterial disease. Unable to obtain TBI due to bandages and open wound.  *See  table(s) above for measurements and observations.  Electronically signed by Ruta Hinds MD on 08/06/2020 at 2:30:02 PM.    Final    ECHOCARDIOGRAM COMPLETE  Result Date: 08/04/2020    ECHOCARDIOGRAM REPORT   Patient Name:   [facility] Date of  Exam: 08/04/2020 Medical Rec #:  [id number]      Height:       63.0 in Accession #:    [id number]     Weight:       241.2 lb Date of Birth:  January 13, 1956      BSA:          2.094 m Patient Age:    36 years       BP:           130/56 mmHg Patient Gender: F              HR:           63 bpm. Exam Location:  Inpatient Procedure: 2D Echo, Cardiac Doppler and Color Doppler Indications:    Dyspnea 06.00  History:        Patient has no prior history of Echocardiogram examinations.                 Risk Factors:Hypertension, Diabetes, Dyslipidemia and Current                 Smoker.  Sonographer:    Tiffany Dance Referring Phys: [id number] Charlesetta Ivory GONFA IMPRESSIONS  1. Left ventricular ejection fraction, by estimation, is 60 to 65%. The left ventricle has normal function. The left ventricle has no regional wall motion abnormalities. Left ventricular diastolic parameters are consistent with Grade II diastolic dysfunction (pseudonormalization).  2. Right ventricular systolic function is normal. The right ventricular size is normal.  3. Left atrial size was mildly dilated.  4. The mitral valve is normal in structure. Trivial mitral valve regurgitation. No evidence of mitral stenosis.  5. The aortic valve is normal in structure. Aortic valve regurgitation is not visualized. No aortic stenosis is present.  6. The inferior vena cava is dilated in size with >50% respiratory variability, suggesting right atrial pressure of 8 mmHg. FINDINGS  Left Ventricle: Left ventricular ejection fraction, by estimation, is 60 to 65%. The left ventricle has normal function. The left ventricle has no regional wall motion abnormalities. The left ventricular internal cavity size was normal in size. There is  no  left ventricular hypertrophy. Left ventricular diastolic parameters are consistent with Grade II diastolic dysfunction (pseudonormalization). Right Ventricle: The right ventricular size is normal. No increase in right ventricular wall thickness. Right ventricular systolic function is normal. Left Atrium: Left atrial size was mildly dilated. Right Atrium: Right atrial size was normal in size. Pericardium: There is no evidence of pericardial effusion. Mitral Valve: The mitral valve is normal in structure. Trivial mitral valve regurgitation. No evidence of mitral valve stenosis. Tricuspid Valve: The tricuspid valve is normal in structure. Tricuspid valve regurgitation is not demonstrated. No evidence of tricuspid stenosis. Aortic Valve: The aortic valve is normal in structure. Aortic valve regurgitation is not visualized. No aortic stenosis is present. Pulmonic Valve: The pulmonic valve was normal in structure. Pulmonic valve regurgitation is not visualized. No evidence of pulmonic stenosis. Aorta: The aortic root is normal in size and structure. Venous: The inferior vena cava is dilated in size with greater than 50% respiratory variability, suggesting right atrial pressure of 8 mmHg. IAS/Shunts: No atrial level shunt detected by color flow Doppler.  LEFT VENTRICLE PLAX 2D LVIDd:         5.30 cm  Diastology LVIDs:         3.50 cm  LV e' medial:    7.07 cm/s LV PW:         1.10  cm  LV E/e' medial:  18.4 LV IVS:        0.80 cm  LV e' lateral:   10.10 cm/s LVOT diam:     1.90 cm  LV E/e' lateral: 12.9 LV SV:         85 LV SV Index:   41 LVOT Area:     2.84 cm  RIGHT VENTRICLE             IVC RV Basal diam:  2.70 cm     IVC diam: 2.80 cm RV S prime:     13.60 cm/s TAPSE (M-mode): 2.5 cm LEFT ATRIUM              Index       RIGHT ATRIUM           Index LA diam:        3.90 cm  1.86 cm/m  RA Area:     16.10 cm LA Vol (A2C):   111.0 ml 53.01 ml/m RA Volume:   42.10 ml  20.11 ml/m LA Vol (A4C):   41.9 ml  20.01 ml/m LA  Biplane Vol: 73.7 ml  35.20 ml/m  AORTIC VALVE LVOT Vmax:   140.00 cm/s LVOT Vmean:  82.100 cm/s LVOT VTI:    0.300 m  AORTA Ao Root diam: 2.90 cm Ao Asc diam:  3.10 cm MITRAL VALVE MV Area (PHT): 3.21 cm     SHUNTS MV Decel Time: 236 msec     Systemic VTI:  0.30 m MV E velocity: 130.00 cm/s  Systemic Diam: 1.90 cm MV A velocity: 86.60 cm/s MV E/A ratio:  1.50 Candee Furbish MD Electronically signed by Candee Furbish MD Signature Date/Time: 08/04/2020/1:50:45 PM    Final    VAS [place] LOWER EXTREMITY VENOUS (DVT)  Result Date: 08/02/2020  Lower Venous DVT Study Patient Name:  MAELYN BERREY  Date of Exam:   08/02/2020 Medical Rec #: [id number]       Accession #:    [id number] Date of Birth: 05-01-1955       Patient Gender: F Patient Age:   64Y Exam Location:  [facility] Procedure:      VAS [place] LOWER EXTREMITY VENOUS (DVT) Referring Phys: [place] --------------------------------------------------------------------------------  Indications: Elevated Ddimer.  Risk Factors: None identified. Limitations: Body habitus, poor ultrasound/tissue interface and patient movement, patient pain tolerance. Comparison Study: No prior studies. Performing Technologist: Oliver Hum RVT  Examination Guidelines: A complete evaluation includes B-mode imaging, spectral Doppler, color Doppler, and power Doppler as needed of all accessible portions of each vessel. Bilateral testing is considered an integral part of a complete examination. Limited examinations for reoccurring indications may be performed as noted. The reflux portion of the exam is performed with the patient in reverse Trendelenburg.  +---------+---------------+---------+-----------+----------+--------------+ RIGHT    CompressibilityPhasicitySpontaneityPropertiesThrombus Aging +---------+---------------+---------+-----------+----------+--------------+ CFV      Full           Yes      Yes                                  +---------+---------------+---------+-----------+----------+--------------+ SFJ      Full                                                        +---------+---------------+---------+-----------+----------+--------------+  FV Prox  Full                                                        +---------+---------------+---------+-----------+----------+--------------+ FV Mid   Full                                                        +---------+---------------+---------+-----------+----------+--------------+ FV DistalFull                                                        +---------+---------------+---------+-----------+----------+--------------+ PFV      Full                                                        +---------+---------------+---------+-----------+----------+--------------+ POP      Full           Yes      Yes                                 +---------+---------------+---------+-----------+----------+--------------+ PTV      Full                                                        +---------+---------------+---------+-----------+----------+--------------+ PERO     Full                                                        +---------+---------------+---------+-----------+----------+--------------+   +---------+---------------+---------+-----------+----------+--------------+ LEFT     CompressibilityPhasicitySpontaneityPropertiesThrombus Aging +---------+---------------+---------+-----------+----------+--------------+ CFV      Full           Yes      Yes                                 +---------+---------------+---------+-----------+----------+--------------+ SFJ      Full                                                        +---------+---------------+---------+-----------+----------+--------------+ FV Prox  Full                                                         +---------+---------------+---------+-----------+----------+--------------+  FV Mid   Full                                                        +---------+---------------+---------+-----------+----------+--------------+ FV Distal               Yes      Yes                                 +---------+---------------+---------+-----------+----------+--------------+ PFV      Full                                                        +---------+---------------+---------+-----------+----------+--------------+ POP      Full           Yes      Yes                                 +---------+---------------+---------+-----------+----------+--------------+ PTV      Full                                                        +---------+---------------+---------+-----------+----------+--------------+ PERO     Full                                                        +---------+---------------+---------+-----------+----------+--------------+     Summary: RIGHT: - There is no evidence of deep vein thrombosis in the lower extremity. However, portions of this examination were limited- see technologist comments above.  - No cystic structure found in the popliteal fossa.  LEFT: - There is no evidence of deep vein thrombosis in the lower extremity. However, portions of this examination were limited- see technologist comments above.  - No cystic structure found in the popliteal fossa.  *See table(s) above for measurements and observations. Electronically signed by Jamelle Haring on 08/02/2020 at 3:39:09 PM.    Final      TODAY-DAY OF DISCHARGE:  Subjective:   Bartholomew Crews today has no headache,no chest abdominal pain,no new weakness tingling or numbness, feels much better wants to go home today.   Objective:   Blood pressure (!) 155/55, pulse 75, temperature 97.8 F (36.6 C), temperature source Oral, resp. rate 18, height $RemoveBe'5\' 3"'LbrlBgwWt$  (1.6 m), weight 99.6 kg, SpO2 94  %.  Intake/Output Summary (Last 24 hours) at 08/11/2020 1026 Last data filed at 08/11/2020 0809 Gross per 24 hour  Intake 1140 ml  Output 700 ml  Net 440 ml   Filed Weights   08/08/20 0459 08/09/20 0501 08/11/20 0418  Weight: 102.1 kg 98.7 kg 99.6 kg    Exam: Awake Alert, Oriented *3, No new F.N deficits, Normal affect [place].AT,PERRAL Supple Neck,No JVD, No cervical lymphadenopathy  appriciated.  Symmetrical Chest wall movement, Good air movement bilaterally, CTAB RRR,No Gallops,Rubs or new Murmurs, No Parasternal Heave +ve B.Sounds, Abd Soft, Non tender, No organomegaly appriciated, No rebound -guarding or rigidity. No Cyanosis, Clubbing or edema, No new Rash or bruise   PERTINENT RADIOLOGIC STUDIES: No results found.   PERTINENT LAB RESULTS: CBC: Recent Labs    08/09/20 0742  WBC 25.5*  HGB 10.6*  HCT 32.9*  PLT 290   CMET CMP     Component Value Date/Time   NA 138 08/11/2020 0110   K 3.8 08/11/2020 0110   CL 97 (L) 08/11/2020 0110   CO2 33 (H) 08/11/2020 0110   GLUCOSE 287 (H) 08/11/2020 0110   BUN 25 (H) 08/11/2020 0110   CREATININE 1.34 (H) 08/11/2020 0110   CREATININE 0.62 10/25/2014 1549   CALCIUM 7.9 (L) 08/11/2020 0110   PROT 6.3 (L) 08/05/2020 0231   ALBUMIN 2.3 (L) 08/07/2020 0236   AST 14 (L) 08/05/2020 0231   ALT 16 08/05/2020 0231   ALKPHOS 128 (H) 08/05/2020 0231   BILITOT 0.9 08/05/2020 0231   GFRNONAA 45 (L) 08/11/2020 0110   GFRNONAA >89 10/25/2014 1549   GFRAA 47 (L) 12/03/2014 0635   GFRAA >89 10/25/2014 1549    GFR Estimated Creatinine Clearance: 48.4 mL/min (A) (by C-G formula based on SCr of 1.34 mg/dL (H)). No results for input(s): LIPASE, AMYLASE in the last 72 hours. No results for input(s): CKTOTAL, CKMB, CKMBINDEX, TROPONINI in the last 72 hours. Invalid input(s): POCBNP No results for input(s): DDIMER in the last 72 hours. No results for input(s): HGBA1C in the last 72 hours. No results for input(s): CHOL, HDL, LDLCALC, TRIG,  CHOLHDL, LDLDIRECT in the last 72 hours. No results for input(s): TSH, T4TOTAL, T3FREE, THYROIDAB in the last 72 hours.  Invalid input(s): FREET3 No results for input(s): VITAMINB12, FOLATE, FERRITIN, TIBC, IRON, RETICCTPCT in the last 72 hours. Coags: No results for input(s): INR in the last 72 hours.  Invalid input(s): PT Microbiology: Recent Results (from the past 240 hour(s))  Blood Culture (routine x 2)     Status: None   Collection Time: 08/02/20  1:00 AM   Specimen: BLOOD  Result Value Ref Range Status   Specimen Description   Final    BLOOD BLOOD LEFT FOREARM Performed at [facility] [address], [place], [place] [address]    Special Requests   Final    BOTTLES DRAWN AEROBIC AND ANAEROBIC Blood Culture results may not be optimal due to an inadequate volume of blood received in culture bottles Performed at [facility] [address] [place], [place] [address]    Culture   Final    NO GROWTH 5 DAYS Performed at [facility], [place] [address] [place], [place] [address]    Report Status 08/07/2020 FINAL  Final  Blood Culture (routine x 2)     Status: None   Collection Time: 08/02/20  1:00 AM   Specimen: BLOOD  Result Value Ref Range Status   Specimen Description   Final    BLOOD BLOOD RIGHT FOREARM Performed at [facility] [address], [place], [place] [address]    Special Requests   Final    BOTTLES DRAWN AEROBIC AND ANAEROBIC Blood Culture adequate volume Performed at [facility] [address] [place],  [address]    Culture   Final    NO GROWTH 5 DAYS Performed at [facility], [address] [place], [place] [address]    Report Status 08/07/2020 FINAL  Final  Resp Panel by RT-PCR (Flu A&B, Covid) Nasopharyngeal Swab     Status: None   Collection Time: 08/02/20  2:32 AM   Specimen: Nasopharyngeal Swab; Nasopharyngeal(NP) swabs in vial transport medium   Result Value Ref Range Status   SARS Coronavirus 2 by RT PCR NEGATIVE NEGATIVE Final    Comment: (NOTE) SARS-CoV-2 target nucleic acids are NOT DETECTED.  The SARS-CoV-2 RNA is generally detectable in upper respiratory specimens during the acute phase of infection. The lowest concentration of SARS-CoV-2 viral copies this assay can detect is 138 copies/mL. A negative result does not preclude SARS-Cov-2 infection and should not be used as the sole basis for treatment or other patient management decisions. A negative result may occur with  improper specimen collection/handling, submission of specimen other than nasopharyngeal swab, presence of viral mutation(s) within the areas targeted by this assay, and inadequate number of viral copies(<138 copies/mL). A negative result must be combined with clinical observations, patient history, and epidemiological information. The expected result is Negative.  Fact Sheet for Patients:  BloggerCourse.com  Fact Sheet for Healthcare Providers:  SeriousBroker.it  This test is no t yet approved or cleared by the [place] FDA and  has been authorized for detection and/or diagnosis of SARS-CoV-2 by FDA under an Emergency Use Authorization (EUA). This EUA will remain  in effect (meaning this test can be used) for the duration of the COVID-19 declaration under Section 564(b)(1) of the Act, 21 U.S.C.section 360bbb-3(b)(1), unless the authorization is terminated  or revoked sooner.       Influenza A by PCR NEGATIVE NEGATIVE Final   Influenza B by PCR NEGATIVE NEGATIVE Final    Comment: (NOTE) The Xpert Xpress SARS-CoV-2/FLU/RSV plus assay is intended as an aid in the diagnosis of influenza from Nasopharyngeal swab specimens and should not be used as a sole basis for treatment. Nasal washings and aspirates are unacceptable for Xpert Xpress SARS-CoV-2/FLU/RSV testing.  Fact Sheet for  Patients: BloggerCourse.com  Fact Sheet for Healthcare Providers: SeriousBroker.it  This test is not yet approved or cleared by the [place] FDA and has been authorized for detection and/or diagnosis of SARS-CoV-2 by FDA under an Emergency Use Authorization (EUA). This EUA will remain in effect (meaning this test can be used) for the duration of the COVID-19 declaration under Section 564(b)(1) of the Act, 21 U.S.C. section 360bbb-3(b)(1), unless the authorization is terminated or revoked.  Performed at [facility], [address], [place], [place] [address]   Urine culture     Status: None   Collection Time: 08/02/20  5:40 AM   Specimen: In/Out Cath Urine  Result Value Ref Range Status   Specimen Description   Final    IN/OUT CATH URINE Performed at [facility], [address], [place], [place] [address]    Special Requests   Final    NONE Performed at [facility], [address] [place], [place] [address]    Culture   Final    NO GROWTH Performed at [facility], [address] [place], [place] [address]    Report Status 08/03/2020 FINAL  Final  MRSA PCR Screening     Status: None   Collection Time: 08/02/20  9:05 AM   Specimen: Nasopharyngeal  Result Value Ref Range Status   MRSA by PCR NEGATIVE NEGATIVE Final    Comment:  The GeneXpert MRSA Assay (FDA approved for NASAL specimens only), is one component of a comprehensive MRSA colonization surveillance program. It is not intended to diagnose MRSA infection nor to guide or monitor treatment for MRSA infections. Performed at [facility], [place] [address], [place], [place] [address]   Aerobic/Anaerobic Culture w Gram Stain (surgical/deep wound)     Status: None   Collection Time: 08/04/20  5:17 PM   Specimen: Wound  Result Value Ref Range Status   Specimen Description  ABSCESS LEFT FOOT  Final   Special Requests NONE  Final   Gram Stain   Final    RARE WBC PRESENT,BOTH PMN AND MONONUCLEAR RARE GRAM POSITIVE COCCI IN PAIRS    Culture   Final    No growth aerobically or anaerobically. Performed at [facility], [place] [address] [place], [place] [address]    Report Status 08/09/2020 FINAL  Final  Aerobic/Anaerobic Culture w Gram Stain (surgical/deep wound)     Status: None (Preliminary result)   Collection Time: 08/06/20 11:57 AM   Specimen: Soft Tissue, Other  Result Value Ref Range Status   Specimen Description   Final    WOUND LT FOOT Performed at [facility] [address] [place], [place] [address]    Special Requests   Final    NONE Performed at [facility], [place] [address] [place], [place] [address]    Gram Stain   Final    RARE WBC PRESENT, PREDOMINANTLY PMN NO ORGANISMS SEEN    Culture   Final    NO GROWTH 4 DAYS NO ANAEROBES ISOLATED; CULTURE IN PROGRESS FOR 5 DAYS Performed at [facility] [address] [place], [place] [address]    Report Status PENDING  Incomplete  SARS CORONAVIRUS 2 (TAT 6-24 HRS) Nasopharyngeal Nasopharyngeal Swab     Status: None   Collection Time: 08/10/20  5:01 PM   Specimen: Nasopharyngeal Swab  Result Value Ref Range Status   SARS Coronavirus 2 NEGATIVE NEGATIVE Final    Comment: (NOTE) SARS-CoV-2 target nucleic acids are NOT DETECTED.  The SARS-CoV-2 RNA is generally detectable in upper and lower respiratory specimens during the acute phase of infection. Negative results do not preclude SARS-CoV-2 infection, do not rule out co-infections with other pathogens, and should not be used as the sole basis for treatment or other patient management decisions. Negative results must be combined with clinical observations, patient history, and epidemiological information. The expected result is Negative.  Fact Sheet for  Patients: SugarRoll.be  Fact Sheet for Healthcare Providers: https://www.woods-mathews.com/  This test is not yet approved or cleared by the [place] FDA and  has been authorized for detection and/or diagnosis of SARS-CoV-2 by FDA under an Emergency Use Authorization (EUA). This EUA will remain  in effect (meaning this test can be used) for the duration of the COVID-19 declaration under Se ction 564(b)(1) of the Act, 21 U.S.C. section 360bbb-3(b)(1), unless the authorization is terminated or revoked sooner.  Performed at [facility], [place] [address], [place],  [address]     FURTHER DISCHARGE INSTRUCTIONS:  Get Medicines reviewed and adjusted: Please take all your medications with you for your next visit with your Primary MD  Laboratory/radiological data: Please request your Primary MD to go over all hospital tests and procedure/radiological results at the follow up, please ask your Primary MD to get all Hospital records sent to his/her office.  In some cases, they will  be blood work, cultures and biopsy results pending at the time of your discharge. Please request that your primary care M.D. goes through all the records of your hospital data and follows up on these results.  Also Note the following: If you experience worsening of your admission symptoms, develop shortness of breath, life threatening emergency, suicidal or homicidal thoughts you must seek medical attention immediately by calling 911 or calling your MD immediately  if symptoms less severe.  You must read complete instructions/literature along with all the possible adverse reactions/side effects for all the Medicines you take and that have been prescribed to you. Take any new Medicines after you have completely understood and accpet all the possible adverse reactions/side effects.   Do not drive when taking Pain medications or sleeping medications  (Benzodaizepines)  Do not take more than prescribed Pain, Sleep and Anxiety Medications. It is not advisable to combine anxiety,sleep and pain medications without talking with your primary care practitioner  Special Instructions: If you have smoked or chewed Tobacco  in the last 2 yrs please stop smoking, stop any regular Alcohol  and or any Recreational drug use.  Wear Seat belts while driving.  Please note: You were cared for by a hospitalist during your hospital stay. Once you are discharged, your primary care physician will handle any further medical issues. Please note that NO REFILLS for any discharge medications will be authorized once you are discharged, as it is imperative that you return to your primary care physician (or establish a relationship with a primary care physician if you do not have one) for your post hospital discharge needs so that they can reassess your need for medications and monitor your lab values.  Total Time spent coordinating discharge including counseling, education and face to face time equals 35 minutes.  SignedOren Binet 08/11/2020 10:26 AM

## 2020-08-11 NOTE — TOC Progression Note (Addendum)
Transition of Care [facility]) - Progression Note    Patient Details  Name: Shirley Chen MRN: [id number] Date of Birth: 1955/11/15  Transition of Care [facility]) CM/SW Contact  Patrice [place], [place] Phone Number: [phone] 08/11/2020, 8:34 AM  Clinical Narrative:     CSW called [facility] and authorization has been approved. Talbot Grumbling #- [id number] Health Plan #- [id number] Case Manager: Marcelina Morel Authorization dates: 5/7-5/11 Fax Number: [phone]   9:05am- called facility awaiting a call back.  TOC team will continue to assist with discharge planning needs.  Expected Discharge Plan: Home w Home Health Services Barriers to Discharge: Continued Medical Work up  Expected Discharge Plan and Services Expected Discharge Plan: Home w Home Health Services   Discharge Planning Services: CM Consult   Living arrangements for the past 2 months: Apartment                                       Social Determinants of Health (SDOH) Interventions    Readmission Risk Interventions No flowsheet data found.

## 2020-08-11 NOTE — Progress Notes (Addendum)
Called report to Wyatt Mage, Charity fundraiser at [employer] care.  D/C tele and IV. D/c documents in pt's chart.   Lawson Radar, RN

## 2020-08-11 NOTE — TOC Transition Note (Signed)
Transition of Care [facility]) - CM/SW Discharge Note   Patient Details  Name: Shirley Chen MRN: [id number] Date of Birth: Feb 12, 1956  Transition of Care [facility]) CM/SW Contact:  Patrice Paradise, LCSW Phone Number:336 [phone]  08/11/2020, 1:40 PM   Clinical Narrative:     Patient will DC to:?[name] Anticipated DC date:?08/11/2020 Family notified:?Eddie Transport by: Sharin Mons   Per MD patient ready for DC to [facility]. RN, patient, patient's family, and facility notified of DC. Discharge Summary sent to facility. RN given number for report  [phone] room 101b . DC packet on chart. Ambulance transport requested for patient.   CSW signing off.   Judd Lien, [place] [phone]   Final next level of care: Skilled Nursing Facility Barriers to Discharge: Barriers Resolved   Patient Goals and CMS Choice Patient states their goals for this hospitalization and ongoing recovery are:: to go home CMS Medicare.gov Compare Post Acute Care list provided to:: Patient Choice offered to / list presented to : Patient  Discharge Placement              Patient chooses bed at: [facility] Patient to be transferred to facility by: [facility] Name of family member notified: Dorma Russell Patient and family notified of of transfer: 08/11/20  Discharge Plan and Services   Discharge Planning Services: CM Consult                                 Social Determinants of Health (SDOH) Interventions     Readmission Risk Interventions No flowsheet data found.

## 2020-08-11 NOTE — Progress Notes (Addendum)
  Progress Note    08/11/2020 9:26 AM 3 Days Post-Op  Subjective:  Says she feels like she has a charlie horse on the left leg.  Says she has quit smoking and this surgery has changed her perspective about things.   Afebrile  Vitals:   08/11/20 0418 08/11/20 0808  BP: (!) 161/59 (!) 155/55  Pulse: 62 75  Resp: 17 18  Temp: 97.9 F (36.6 C) 97.8 F (36.6 C)  SpO2: 94% 94%    Physical Exam: Incisions:     Minimal drainage on bandage just lateral of midline.    CBC    Component Value Date/Time   WBC 25.5 (H) 08/09/2020 0742   RBC 3.57 (L) 08/09/2020 0742   HGB 10.6 (L) 08/09/2020 0742   HCT 32.9 (L) 08/09/2020 0742   PLT 290 08/09/2020 0742   MCV 92.2 08/09/2020 0742   MCH 29.7 08/09/2020 0742   MCHC 32.2 08/09/2020 0742   RDW 13.5 08/09/2020 0742   LYMPHSABS 1.9 08/07/2020 0236   MONOABS 2.1 (H) 08/07/2020 0236   EOSABS 0.1 08/07/2020 0236   BASOSABS 0.1 08/07/2020 0236    BMET    Component Value Date/Time   NA 138 08/11/2020 0110   K 3.8 08/11/2020 0110   CL 97 (L) 08/11/2020 0110   CO2 33 (H) 08/11/2020 0110   GLUCOSE 287 (H) 08/11/2020 0110   BUN 25 (H) 08/11/2020 0110   CREATININE 1.34 (H) 08/11/2020 0110   CREATININE 0.62 10/25/2014 1549   CALCIUM 7.9 (L) 08/11/2020 0110   GFRNONAA 45 (L) 08/11/2020 0110   GFRNONAA >89 10/25/2014 1549   GFRAA 47 (L) 12/03/2014 0635   GFRAA >89 10/25/2014 1549    INR    Component Value Date/Time   INR 1.1 08/02/2020 0559     Intake/Output Summary (Last 24 hours) at 08/11/2020 0926 Last data filed at 08/11/2020 0809 Gross per 24 hour  Intake 1140 ml  Output 700 ml  Net 440 ml     Assessment/Plan:  65 y.o. female is s/p left below knee amputation  3 Days Post-Op  -pt's incision looks good with only minimal drainage on bandage from yesterday. -will have her f/u in 4-5 weeks for staple removal  -call with questions   Doreatha Massed, PA-C [facility] [phone] 08/11/2020 9:26  AM  BKA healing well  She says she is going to rehab today She has follow up with [place] scheduled  Fabienne Bruns, MD [facility] [place] Office: [phone]

## 2020-08-16 DIAGNOSIS — I5032 Chronic diastolic (congestive) heart failure: Secondary | ICD-10-CM | POA: Diagnosis not present

## 2020-08-16 DIAGNOSIS — Z89512 Acquired absence of left leg below knee: Secondary | ICD-10-CM | POA: Diagnosis not present

## 2020-08-16 DIAGNOSIS — I739 Peripheral vascular disease, unspecified: Secondary | ICD-10-CM | POA: Diagnosis not present

## 2020-08-16 DIAGNOSIS — E1122 Type 2 diabetes mellitus with diabetic chronic kidney disease: Secondary | ICD-10-CM | POA: Diagnosis not present

## 2020-08-23 DIAGNOSIS — M79604 Pain in right leg: Secondary | ICD-10-CM | POA: Diagnosis not present

## 2020-08-23 DIAGNOSIS — E1159 Type 2 diabetes mellitus with other circulatory complications: Secondary | ICD-10-CM | POA: Diagnosis not present

## 2020-08-23 DIAGNOSIS — Z89512 Acquired absence of left leg below knee: Secondary | ICD-10-CM | POA: Diagnosis not present

## 2020-09-10 ENCOUNTER — Other Ambulatory Visit: Payer: Self-pay | Admitting: Family

## 2020-09-10 DIAGNOSIS — I10 Essential (primary) hypertension: Secondary | ICD-10-CM

## 2020-09-11 DIAGNOSIS — E1159 Type 2 diabetes mellitus with other circulatory complications: Secondary | ICD-10-CM | POA: Diagnosis not present

## 2020-09-11 DIAGNOSIS — U071 COVID-19: Secondary | ICD-10-CM | POA: Diagnosis not present

## 2020-09-11 DIAGNOSIS — R600 Localized edema: Secondary | ICD-10-CM | POA: Diagnosis not present

## 2020-09-11 DIAGNOSIS — Z89512 Acquired absence of left leg below knee: Secondary | ICD-10-CM | POA: Diagnosis not present

## 2020-09-17 DIAGNOSIS — Z89512 Acquired absence of left leg below knee: Secondary | ICD-10-CM | POA: Diagnosis not present

## 2020-09-17 DIAGNOSIS — R110 Nausea: Secondary | ICD-10-CM | POA: Diagnosis not present

## 2020-09-17 DIAGNOSIS — R5381 Other malaise: Secondary | ICD-10-CM | POA: Diagnosis not present

## 2020-09-17 DIAGNOSIS — U071 COVID-19: Secondary | ICD-10-CM | POA: Diagnosis not present

## 2020-09-18 DIAGNOSIS — U071 COVID-19: Secondary | ICD-10-CM | POA: Diagnosis not present

## 2020-09-18 DIAGNOSIS — E1151 Type 2 diabetes mellitus with diabetic peripheral angiopathy without gangrene: Secondary | ICD-10-CM | POA: Diagnosis not present

## 2020-09-18 DIAGNOSIS — I739 Peripheral vascular disease, unspecified: Secondary | ICD-10-CM | POA: Diagnosis not present

## 2020-09-18 DIAGNOSIS — Z89512 Acquired absence of left leg below knee: Secondary | ICD-10-CM | POA: Diagnosis not present

## 2020-09-20 DIAGNOSIS — Z89512 Acquired absence of left leg below knee: Secondary | ICD-10-CM | POA: Diagnosis not present

## 2020-09-20 DIAGNOSIS — Z4802 Encounter for removal of sutures: Secondary | ICD-10-CM | POA: Diagnosis not present

## 2020-09-20 DIAGNOSIS — R600 Localized edema: Secondary | ICD-10-CM | POA: Diagnosis not present

## 2020-09-24 DIAGNOSIS — M6281 Muscle weakness (generalized): Secondary | ICD-10-CM | POA: Diagnosis not present

## 2020-09-24 DIAGNOSIS — Z4781 Encounter for orthopedic aftercare following surgical amputation: Secondary | ICD-10-CM | POA: Diagnosis not present

## 2020-09-24 DIAGNOSIS — Z89512 Acquired absence of left leg below knee: Secondary | ICD-10-CM | POA: Diagnosis not present

## 2020-09-24 DIAGNOSIS — R262 Difficulty in walking, not elsewhere classified: Secondary | ICD-10-CM | POA: Diagnosis not present

## 2020-09-25 ENCOUNTER — Emergency Department (HOSPITAL_COMMUNITY): Payer: Medicare Other

## 2020-09-25 ENCOUNTER — Encounter (HOSPITAL_COMMUNITY): Payer: Self-pay

## 2020-09-25 ENCOUNTER — Other Ambulatory Visit: Payer: Self-pay

## 2020-09-25 ENCOUNTER — Inpatient Hospital Stay (HOSPITAL_COMMUNITY)
Admission: EM | Admit: 2020-09-25 | Discharge: 2020-10-04 | DRG: 871 | Disposition: A | Discharge status: Skilled Nursing Facility | Payer: Medicare Other | Source: Skilled Nursing Facility | Attending: Internal Medicine | Admitting: Internal Medicine

## 2020-09-25 DIAGNOSIS — M6281 Muscle weakness (generalized): Secondary | ICD-10-CM | POA: Diagnosis not present

## 2020-09-25 DIAGNOSIS — I739 Peripheral vascular disease, unspecified: Secondary | ICD-10-CM | POA: Diagnosis not present

## 2020-09-25 DIAGNOSIS — I129 Hypertensive chronic kidney disease with stage 1 through stage 4 chronic kidney disease, or unspecified chronic kidney disease: Secondary | ICD-10-CM | POA: Diagnosis present

## 2020-09-25 DIAGNOSIS — M726 Necrotizing fasciitis: Secondary | ICD-10-CM | POA: Diagnosis present

## 2020-09-25 DIAGNOSIS — R262 Difficulty in walking, not elsewhere classified: Secondary | ICD-10-CM | POA: Diagnosis not present

## 2020-09-25 DIAGNOSIS — U071 COVID-19: Secondary | ICD-10-CM | POA: Diagnosis not present

## 2020-09-25 DIAGNOSIS — Z83438 Family history of other disorder of lipoprotein metabolism and other lipidemia: Secondary | ICD-10-CM | POA: Diagnosis not present

## 2020-09-25 DIAGNOSIS — Z833 Family history of diabetes mellitus: Secondary | ICD-10-CM | POA: Diagnosis not present

## 2020-09-25 DIAGNOSIS — L089 Local infection of the skin and subcutaneous tissue, unspecified: Secondary | ICD-10-CM | POA: Diagnosis present

## 2020-09-25 DIAGNOSIS — R0902 Hypoxemia: Secondary | ICD-10-CM | POA: Diagnosis not present

## 2020-09-25 DIAGNOSIS — R059 Cough, unspecified: Secondary | ICD-10-CM | POA: Diagnosis not present

## 2020-09-25 DIAGNOSIS — B9689 Other specified bacterial agents as the cause of diseases classified elsewhere: Secondary | ICD-10-CM | POA: Diagnosis not present

## 2020-09-25 DIAGNOSIS — R7881 Bacteremia: Secondary | ICD-10-CM | POA: Diagnosis not present

## 2020-09-25 DIAGNOSIS — Z8249 Family history of ischemic heart disease and other diseases of the circulatory system: Secondary | ICD-10-CM | POA: Diagnosis not present

## 2020-09-25 DIAGNOSIS — R5381 Other malaise: Secondary | ICD-10-CM | POA: Diagnosis not present

## 2020-09-25 DIAGNOSIS — A419 Sepsis, unspecified organism: Secondary | ICD-10-CM | POA: Diagnosis not present

## 2020-09-25 DIAGNOSIS — Z794 Long term (current) use of insulin: Secondary | ICD-10-CM

## 2020-09-25 DIAGNOSIS — I10 Essential (primary) hypertension: Secondary | ICD-10-CM | POA: Diagnosis present

## 2020-09-25 DIAGNOSIS — Z720 Tobacco use: Secondary | ICD-10-CM | POA: Diagnosis not present

## 2020-09-25 DIAGNOSIS — Z89411 Acquired absence of right great toe: Secondary | ICD-10-CM | POA: Diagnosis not present

## 2020-09-25 DIAGNOSIS — R112 Nausea with vomiting, unspecified: Secondary | ICD-10-CM

## 2020-09-25 DIAGNOSIS — N1832 Chronic kidney disease, stage 3b: Secondary | ICD-10-CM | POA: Diagnosis not present

## 2020-09-25 DIAGNOSIS — Z79899 Other long term (current) drug therapy: Secondary | ICD-10-CM

## 2020-09-25 DIAGNOSIS — E1151 Type 2 diabetes mellitus with diabetic peripheral angiopathy without gangrene: Secondary | ICD-10-CM | POA: Diagnosis present

## 2020-09-25 DIAGNOSIS — I152 Hypertension secondary to endocrine disorders: Secondary | ICD-10-CM | POA: Diagnosis present

## 2020-09-25 DIAGNOSIS — N179 Acute kidney failure, unspecified: Secondary | ICD-10-CM | POA: Diagnosis not present

## 2020-09-25 DIAGNOSIS — J9601 Acute respiratory failure with hypoxia: Secondary | ICD-10-CM | POA: Diagnosis not present

## 2020-09-25 DIAGNOSIS — Z8616 Personal history of COVID-19: Secondary | ICD-10-CM | POA: Diagnosis not present

## 2020-09-25 DIAGNOSIS — E785 Hyperlipidemia, unspecified: Secondary | ICD-10-CM | POA: Diagnosis present

## 2020-09-25 DIAGNOSIS — E876 Hypokalemia: Secondary | ICD-10-CM | POA: Diagnosis present

## 2020-09-25 DIAGNOSIS — E860 Dehydration: Secondary | ICD-10-CM | POA: Diagnosis not present

## 2020-09-25 DIAGNOSIS — Z4781 Encounter for orthopedic aftercare following surgical amputation: Secondary | ICD-10-CM | POA: Diagnosis not present

## 2020-09-25 DIAGNOSIS — B9620 Unspecified Escherichia coli [E. coli] as the cause of diseases classified elsewhere: Secondary | ICD-10-CM | POA: Diagnosis present

## 2020-09-25 DIAGNOSIS — Z7984 Long term (current) use of oral hypoglycemic drugs: Secondary | ICD-10-CM

## 2020-09-25 DIAGNOSIS — M109 Gout, unspecified: Secondary | ICD-10-CM | POA: Diagnosis not present

## 2020-09-25 DIAGNOSIS — Z743 Need for continuous supervision: Secondary | ICD-10-CM | POA: Diagnosis not present

## 2020-09-25 DIAGNOSIS — Z89512 Acquired absence of left leg below knee: Secondary | ICD-10-CM | POA: Diagnosis not present

## 2020-09-25 DIAGNOSIS — R531 Weakness: Secondary | ICD-10-CM | POA: Diagnosis not present

## 2020-09-25 DIAGNOSIS — E875 Hyperkalemia: Secondary | ICD-10-CM | POA: Diagnosis not present

## 2020-09-25 DIAGNOSIS — E1122 Type 2 diabetes mellitus with diabetic chronic kidney disease: Secondary | ICD-10-CM | POA: Diagnosis present

## 2020-09-25 DIAGNOSIS — G9341 Metabolic encephalopathy: Secondary | ICD-10-CM | POA: Diagnosis present

## 2020-09-25 DIAGNOSIS — D72829 Elevated white blood cell count, unspecified: Secondary | ICD-10-CM | POA: Insufficient documentation

## 2020-09-25 DIAGNOSIS — R6520 Severe sepsis without septic shock: Secondary | ICD-10-CM | POA: Diagnosis present

## 2020-09-25 DIAGNOSIS — Z87891 Personal history of nicotine dependence: Secondary | ICD-10-CM

## 2020-09-25 DIAGNOSIS — E872 Acidosis, unspecified: Secondary | ICD-10-CM

## 2020-09-25 DIAGNOSIS — E1159 Type 2 diabetes mellitus with other circulatory complications: Secondary | ICD-10-CM | POA: Diagnosis present

## 2020-09-25 DIAGNOSIS — E1169 Type 2 diabetes mellitus with other specified complication: Secondary | ICD-10-CM | POA: Diagnosis present

## 2020-09-25 DIAGNOSIS — M79604 Pain in right leg: Secondary | ICD-10-CM | POA: Diagnosis not present

## 2020-09-25 DIAGNOSIS — E11628 Type 2 diabetes mellitus with other skin complications: Secondary | ICD-10-CM | POA: Diagnosis present

## 2020-09-25 DIAGNOSIS — R1111 Vomiting without nausea: Secondary | ICD-10-CM | POA: Diagnosis not present

## 2020-09-25 DIAGNOSIS — R509 Fever, unspecified: Secondary | ICD-10-CM | POA: Diagnosis not present

## 2020-09-25 DIAGNOSIS — D649 Anemia, unspecified: Secondary | ICD-10-CM | POA: Diagnosis not present

## 2020-09-25 DIAGNOSIS — Z1612 Extended spectrum beta lactamase (ESBL) resistance: Secondary | ICD-10-CM | POA: Diagnosis not present

## 2020-09-25 DIAGNOSIS — R17 Unspecified jaundice: Secondary | ICD-10-CM | POA: Diagnosis not present

## 2020-09-25 LAB — CBC
HCT: 40.2 % (ref 36.0–46.0)
Hemoglobin: 12.6 g/dL (ref 12.0–15.0)
MCH: 30.4 pg (ref 26.0–34.0)
MCHC: 31.3 g/dL (ref 30.0–36.0)
MCV: 97.1 fL (ref 80.0–100.0)
Platelets: 262 10*3/uL (ref 150–400)
RBC: 4.14 MIL/uL (ref 3.87–5.11)
RDW: 15.8 % — ABNORMAL HIGH (ref 11.5–15.5)
WBC: 17.5 10*3/uL — ABNORMAL HIGH (ref 4.0–10.5)
nRBC: 0 % (ref 0.0–0.2)

## 2020-09-25 LAB — COMPREHENSIVE METABOLIC PANEL
ALT: 26 U/L (ref 0–44)
AST: 20 U/L (ref 15–41)
Albumin: 3.8 g/dL (ref 3.5–5.0)
Alkaline Phosphatase: 150 U/L — ABNORMAL HIGH (ref 38–126)
Anion gap: 10 (ref 5–15)
BUN: 49 mg/dL — ABNORMAL HIGH (ref 8–23)
CO2: 21 mmol/L — ABNORMAL LOW (ref 22–32)
Calcium: 9.4 mg/dL (ref 8.9–10.3)
Chloride: 105 mmol/L (ref 98–111)
Creatinine, Ser: 2.56 mg/dL — ABNORMAL HIGH (ref 0.44–1.00)
GFR, Estimated: 20 mL/min — ABNORMAL LOW (ref >60)
Glucose, Bld: 166 mg/dL — ABNORMAL HIGH (ref 70–99)
Potassium: 7.4 mmol/L (ref 3.5–5.1)
Sodium: 136 mmol/L (ref 135–145)
Total Bilirubin: 1.6 mg/dL — ABNORMAL HIGH (ref 0.3–1.2)
Total Protein: 7.4 g/dL (ref 6.5–8.1)

## 2020-09-25 LAB — LIPASE, BLOOD: Lipase: 20 U/L (ref 11–51)

## 2020-09-25 LAB — GLUCOSE, CAPILLARY: Glucose-Capillary: 203 mg/dL — ABNORMAL HIGH (ref 70–99)

## 2020-09-25 LAB — RESP PANEL BY RT-PCR (FLU A&B, COVID) ARPGX2
Influenza A by PCR: NEGATIVE
Influenza B by PCR: NEGATIVE
SARS Coronavirus 2 by RT PCR: POSITIVE — AB

## 2020-09-25 LAB — CBG MONITORING, ED
Glucose-Capillary: 177 mg/dL — ABNORMAL HIGH (ref 70–99)
Glucose-Capillary: 223 mg/dL — ABNORMAL HIGH (ref 70–99)

## 2020-09-25 MED ORDER — DEXTROSE 50 % IV SOLN
1.0000 | Freq: Once | INTRAVENOUS | Status: AC
  Administered 2020-09-25: 50 mL via INTRAVENOUS
  Filled 2020-09-25: qty 50

## 2020-09-25 MED ORDER — SODIUM CHLORIDE 0.9 % IV BOLUS
500.0000 mL | Freq: Once | INTRAVENOUS | Status: AC
  Administered 2020-09-25: 500 mL via INTRAVENOUS

## 2020-09-25 MED ORDER — SODIUM CHLORIDE 0.9 % IV SOLN
1.0000 g | Freq: Once | INTRAVENOUS | Status: DC
  Filled 2020-09-25: qty 10

## 2020-09-25 MED ORDER — SODIUM CHLORIDE 0.9 % IV SOLN
100.0000 mg | Freq: Once | INTRAVENOUS | Status: AC
  Administered 2020-09-26: 100 mg via INTRAVENOUS
  Filled 2020-09-25: qty 20

## 2020-09-25 MED ORDER — SODIUM ZIRCONIUM CYCLOSILICATE 10 G PO PACK
10.0000 g | PACK | Freq: Once | ORAL | Status: AC
  Administered 2020-09-25: 10 g via ORAL
  Filled 2020-09-25: qty 1

## 2020-09-25 MED ORDER — ZOLPIDEM TARTRATE 5 MG PO TABS
5.0000 mg | ORAL_TABLET | Freq: Every evening | ORAL | Status: DC | PRN
  Administered 2020-09-26: 5 mg via ORAL
  Filled 2020-09-25: qty 1

## 2020-09-25 MED ORDER — VANCOMYCIN HCL 1750 MG/350ML IV SOLN
1750.0000 mg | INTRAVENOUS | Status: AC
  Administered 2020-09-26: 1750 mg via INTRAVENOUS
  Filled 2020-09-25: qty 350

## 2020-09-25 MED ORDER — HYDROXYZINE HCL 25 MG PO TABS: 25.0000 mg | ORAL_TABLET | Freq: Three times a day (TID) | ORAL | Status: DC | PRN

## 2020-09-25 MED ORDER — CALCIUM CARBONATE ANTACID 1250 MG/5ML PO SUSP
500.0000 mg | Freq: Four times a day (QID) | ORAL | Status: DC | PRN
  Filled 2020-09-25: qty 5

## 2020-09-25 MED ORDER — HEPARIN SODIUM (PORCINE) 5000 UNIT/ML IJ SOLN
5000.0000 [IU] | Freq: Three times a day (TID) | INTRAMUSCULAR | Status: DC
  Administered 2020-09-25 – 2020-10-04 (×26): 5000 [IU] via SUBCUTANEOUS
  Filled 2020-09-25 (×27): qty 1

## 2020-09-25 MED ORDER — SODIUM CHLORIDE 0.9 % IV SOLN
2.0000 g | INTRAVENOUS | Status: DC
  Administered 2020-09-26: 2 g via INTRAVENOUS
  Filled 2020-09-25: qty 2

## 2020-09-25 MED ORDER — ONDANSETRON HCL 4 MG PO TABS: 4.0000 mg | ORAL_TABLET | Freq: Four times a day (QID) | ORAL | Status: DC | PRN

## 2020-09-25 MED ORDER — ACETAMINOPHEN 650 MG RE SUPP
650.0000 mg | RECTAL | Status: DC | PRN
  Administered 2020-09-25: 650 mg via RECTAL
  Filled 2020-09-25: qty 1

## 2020-09-25 MED ORDER — SODIUM CHLORIDE 0.9 % IV SOLN
2.0000 g | INTRAVENOUS | Status: AC
  Administered 2020-09-26: 2 g via INTRAVENOUS
  Filled 2020-09-25: qty 2

## 2020-09-25 MED ORDER — INSULIN ASPART 100 UNIT/ML IV SOLN
5.0000 [IU] | Freq: Once | INTRAVENOUS | Status: AC
  Administered 2020-09-25: 5 [IU] via INTRAVENOUS
  Filled 2020-09-25: qty 0.05

## 2020-09-25 MED ORDER — ONDANSETRON HCL 4 MG/2ML IJ SOLN: 4.0000 mg | Freq: Four times a day (QID) | INTRAMUSCULAR | Status: DC | PRN

## 2020-09-25 MED ORDER — CAMPHOR-MENTHOL 0.5-0.5 % EX LOTN
1.0000 "application " | TOPICAL_LOTION | Freq: Three times a day (TID) | CUTANEOUS | Status: DC | PRN
  Filled 2020-09-25: qty 222

## 2020-09-25 MED ORDER — ACETAMINOPHEN 325 MG PO TABS
650.0000 mg | ORAL_TABLET | Freq: Four times a day (QID) | ORAL | Status: DC | PRN
  Administered 2020-09-26 – 2020-09-29 (×7): 650 mg via ORAL
  Filled 2020-09-25 (×8): qty 2

## 2020-09-25 MED ORDER — NEPRO/CARBSTEADY PO LIQD
237.0000 mL | Freq: Three times a day (TID) | ORAL | Status: DC | PRN
  Filled 2020-09-25: qty 237

## 2020-09-25 MED ORDER — DEXAMETHASONE SODIUM PHOSPHATE 10 MG/ML IJ SOLN
6.0000 mg | INTRAMUSCULAR | Status: DC
  Administered 2020-09-25: 6 mg via INTRAVENOUS
  Filled 2020-09-25: qty 1

## 2020-09-25 MED ORDER — STERILE WATER FOR INJECTION IV SOLN
INTRAVENOUS | Status: DC
  Filled 2020-09-25: qty 150
  Filled 2020-09-25: qty 1000
  Filled 2020-09-25: qty 150

## 2020-09-25 MED ORDER — ALBUTEROL SULFATE (2.5 MG/3ML) 0.083% IN NEBU
10.0000 mg | INHALATION_SOLUTION | Freq: Once | RESPIRATORY_TRACT | Status: AC
  Administered 2020-09-25: 10 mg via RESPIRATORY_TRACT
  Filled 2020-09-25: qty 12

## 2020-09-25 MED ORDER — CALCIUM GLUCONATE-NACL 1-0.675 GM/50ML-% IV SOLN
1.0000 g | Freq: Once | INTRAVENOUS | Status: AC
  Administered 2020-09-25: 1000 mg via INTRAVENOUS
  Filled 2020-09-25: qty 50

## 2020-09-25 MED ORDER — ACETAMINOPHEN 650 MG RE SUPP: 650.0000 mg | Freq: Four times a day (QID) | RECTAL | Status: DC | PRN

## 2020-09-25 MED ORDER — SORBITOL 70 % SOLN
30.0000 mL | Status: DC | PRN
  Administered 2020-09-28: 30 mL via ORAL
  Filled 2020-09-25: qty 30

## 2020-09-25 MED ORDER — SODIUM CHLORIDE 0.9 % IV SOLN
100.0000 mg | Freq: Every day | INTRAVENOUS | Status: AC
  Administered 2020-09-27 – 2020-09-30 (×4): 100 mg via INTRAVENOUS
  Filled 2020-09-25 (×4): qty 20

## 2020-09-25 MED ORDER — VANCOMYCIN HCL 1000 MG/200ML IV SOLN: 1000.0000 mg | INTRAVENOUS | Status: DC

## 2020-09-25 MED ORDER — DOCUSATE SODIUM 283 MG RE ENEM
1.0000 | ENEMA | RECTAL | Status: DC | PRN
  Filled 2020-09-25: qty 1

## 2020-09-25 NOTE — ED Triage Notes (Signed)
Per EMS- patient is a resident at [facility]  Patient ahs been vomiting x 2 days especially after taking pain meds.

## 2020-09-25 NOTE — ED Notes (Signed)
Admitting provider at bedside.
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
## 2020-09-25 NOTE — ED Notes (Signed)
Spoke with Marena Chancy, RN at [facility] regarding pts care. Staff requesting call back when dispo is available.
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
## 2020-09-26 DIAGNOSIS — A419 Sepsis, unspecified organism: Principal | ICD-10-CM

## 2020-09-26 DIAGNOSIS — R6520 Severe sepsis without septic shock: Secondary | ICD-10-CM

## 2020-09-26 DIAGNOSIS — N1832 Chronic kidney disease, stage 3b: Secondary | ICD-10-CM

## 2020-09-26 DIAGNOSIS — E872 Acidosis, unspecified: Secondary | ICD-10-CM

## 2020-09-26 DIAGNOSIS — N179 Acute kidney failure, unspecified: Secondary | ICD-10-CM

## 2020-09-26 DIAGNOSIS — U071 COVID-19: Secondary | ICD-10-CM

## 2020-09-26 LAB — URINALYSIS, ROUTINE W REFLEX MICROSCOPIC
Bilirubin Urine: NEGATIVE
Glucose, UA: NEGATIVE mg/dL
Ketones, ur: NEGATIVE mg/dL
Nitrite: NEGATIVE
Protein, ur: 30 mg/dL — AB
Specific Gravity, Urine: 1.01 (ref 1.005–1.030)
WBC, UA: >50 WBC/hpf — ABNORMAL HIGH (ref 0–5)
pH: 5 (ref 5.0–8.0)

## 2020-09-26 LAB — GLUCOSE, CAPILLARY: Glucose-Capillary: 338 mg/dL — ABNORMAL HIGH (ref 70–99)

## 2020-09-26 LAB — BASIC METABOLIC PANEL
Anion gap: 10 (ref 5–15)
BUN: 52 mg/dL — ABNORMAL HIGH (ref 8–23)
CO2: 26 mmol/L (ref 22–32)
Calcium: 8.6 mg/dL — ABNORMAL LOW (ref 8.9–10.3)
Chloride: 100 mmol/L (ref 98–111)
Creatinine, Ser: 2.15 mg/dL — ABNORMAL HIGH (ref 0.44–1.00)
GFR, Estimated: 25 mL/min — ABNORMAL LOW (ref >60)
Glucose, Bld: 299 mg/dL — ABNORMAL HIGH (ref 70–99)
Potassium: 4.8 mmol/L (ref 3.5–5.1)
Sodium: 136 mmol/L (ref 135–145)

## 2020-09-26 LAB — COMPREHENSIVE METABOLIC PANEL
ALT: 21 U/L (ref 0–44)
AST: 17 U/L (ref 15–41)
Albumin: 3.1 g/dL — ABNORMAL LOW (ref 3.5–5.0)
Alkaline Phosphatase: 118 U/L (ref 38–126)
Anion gap: 13 (ref 5–15)
BUN: 53 mg/dL — ABNORMAL HIGH (ref 8–23)
CO2: 18 mmol/L — ABNORMAL LOW (ref 22–32)
Calcium: 8.9 mg/dL (ref 8.9–10.3)
Chloride: 106 mmol/L (ref 98–111)
Creatinine, Ser: 2.68 mg/dL — ABNORMAL HIGH (ref 0.44–1.00)
GFR, Estimated: 19 mL/min — ABNORMAL LOW (ref >60)
Glucose, Bld: 213 mg/dL — ABNORMAL HIGH (ref 70–99)
Potassium: 5.2 mmol/L — ABNORMAL HIGH (ref 3.5–5.1)
Sodium: 137 mmol/L (ref 135–145)
Total Bilirubin: 2 mg/dL — ABNORMAL HIGH (ref 0.3–1.2)
Total Protein: 6.3 g/dL — ABNORMAL LOW (ref 6.5–8.1)

## 2020-09-26 LAB — CBC
HCT: 36.6 % (ref 36.0–46.0)
Hemoglobin: 11.3 g/dL — ABNORMAL LOW (ref 12.0–15.0)
MCH: 30.5 pg (ref 26.0–34.0)
MCHC: 30.9 g/dL (ref 30.0–36.0)
MCV: 98.7 fL (ref 80.0–100.0)
Platelets: 197 10*3/uL (ref 150–400)
RBC: 3.71 MIL/uL — ABNORMAL LOW (ref 3.87–5.11)
RDW: 15.9 % — ABNORMAL HIGH (ref 11.5–15.5)
WBC: 17 10*3/uL — ABNORMAL HIGH (ref 4.0–10.5)
nRBC: 0 % (ref 0.0–0.2)

## 2020-09-26 LAB — CREATININE, SERUM
Creatinine, Ser: 2.69 mg/dL — ABNORMAL HIGH (ref 0.44–1.00)
GFR, Estimated: 19 mL/min — ABNORMAL LOW (ref >60)

## 2020-09-26 LAB — PROCALCITONIN: Procalcitonin: 140.09 ng/mL

## 2020-09-26 LAB — MRSA PCR SCREENING: MRSA by PCR: NEGATIVE

## 2020-09-26 LAB — LACTIC ACID, PLASMA: Lactic Acid, Venous: 1.4 mmol/L (ref 0.5–1.9)

## 2020-09-26 MED ORDER — METHYLPREDNISOLONE SODIUM SUCC 125 MG IJ SOLR
60.0000 mg | Freq: Two times a day (BID) | INTRAMUSCULAR | Status: DC
  Administered 2020-09-26 – 2020-09-30 (×9): 60 mg via INTRAVENOUS
  Filled 2020-09-26 (×10): qty 2

## 2020-09-26 MED ORDER — INSULIN ASPART 100 UNIT/ML IJ SOLN
0.0000 [IU] | Freq: Three times a day (TID) | INTRAMUSCULAR | Status: DC
Start: 2020-09-27 — End: 2020-10-04
  Administered 2020-09-27 (×3): 7 [IU] via SUBCUTANEOUS
  Administered 2020-09-28: 9 [IU] via SUBCUTANEOUS
  Administered 2020-09-28: 7 [IU] via SUBCUTANEOUS
  Administered 2020-09-28: 3 [IU] via SUBCUTANEOUS
  Administered 2020-09-29: 7 [IU] via SUBCUTANEOUS
  Administered 2020-09-29: 9 [IU] via SUBCUTANEOUS
  Administered 2020-09-29 – 2020-09-30 (×3): 7 [IU] via SUBCUTANEOUS
  Administered 2020-09-30: 9 [IU] via SUBCUTANEOUS
  Administered 2020-10-01: 7 [IU] via SUBCUTANEOUS
  Administered 2020-10-01: 9 [IU] via SUBCUTANEOUS
  Administered 2020-10-01: 7 [IU] via SUBCUTANEOUS
  Administered 2020-10-02: 1 [IU] via SUBCUTANEOUS
  Administered 2020-10-02 – 2020-10-03 (×2): 2 [IU] via SUBCUTANEOUS
  Administered 2020-10-03: 1 [IU] via SUBCUTANEOUS
  Administered 2020-10-03: 3 [IU] via SUBCUTANEOUS
  Administered 2020-10-04: 2 [IU] via SUBCUTANEOUS

## 2020-09-26 MED ORDER — LACTATED RINGERS IV SOLN: INTRAVENOUS | Status: DC

## 2020-09-26 MED ORDER — INSULIN ASPART 100 UNIT/ML IJ SOLN
0.0000 [IU] | Freq: Every day | INTRAMUSCULAR | Status: DC
  Administered 2020-09-26 – 2020-09-27 (×2): 4 [IU] via SUBCUTANEOUS
  Administered 2020-09-28 – 2020-10-01 (×4): 5 [IU] via SUBCUTANEOUS

## 2020-09-26 MED ORDER — BOOST / RESOURCE BREEZE PO LIQD CUSTOM
1.0000 | Freq: Two times a day (BID) | ORAL | Status: DC
  Administered 2020-09-26 – 2020-09-29 (×5): 1 via ORAL

## 2020-09-26 NOTE — Progress Notes (Signed)
Progress Note    Shirley Chen   YJE:[id number]  DOB: 18-Oct-1955  DOA: 09/25/2020     1  PCP: Marrian Salvage, FNP  CC: weakness, N/V  Hospital Course: Shirley Chen is a 65 y.o. female with medical history significant of hypertension, diabetes, peripheral vascular disease, tobacco abuse, recent necrotizing fasciitis with left below-knee amputation in May, history of femoral-tibial bypass surgery in the past who was discharged to the skilled facility where she resides.   She presented to the hospital with nausea/vomiting. On work-up she was found to be febrile, 105 degrees, lethargic.  There was concern for underlying sepsis and she was started on vancomycin and cefepime for empiric coverage.  COVID-19 testing also came back positive and she had been placed on oxygen for low normal saturations. Further work-up revealed AKI and she was started on fluids as well.  Interval History:  Seen this morning resting in her bed comfortable but still lethargic but was able to carry on conversation easily.  ROS: Constitutional: positive for chills, fatigue, fevers, and malaise, negative for night sweats, Respiratory: negative for cough and sputum, Cardiovascular: negative for chest pain, and Gastrointestinal: negative for abdominal pain  Assessment & Plan: * Hyperkalemia-resolved as of 09/26/2020 - Treated on admission - Trend BMP  COVID-19 virus infection - continue remdesivir and steroids for now - unclear if was truly hypoxic on admission; now weaned to RA on 6/22 - trend inflammatory markers   Severe sepsis (HCC) - febrile, tachypnea, leukocytosis, initially on O2 on admission - now weaned off O2 - possible source considered covid vs other u/k source currently - continue empiric vanc and cefepime; low threshold to add Flagyl if further decline or not much improvement - Trend procalcitonin (140 >>) - lactic normal  Acute renal failure superimposed on stage 3b chronic kidney  disease ([place]) - patient has history of CKD3b. Baseline creat ~ 1.4, eGFR 42 - patient presents with increase in creat >0.3 mg/dL above baseline, creat increase >1.5x baseline presumed to have occurred within past 7 days PTA - creat 2.56 on admission; etiology presumed to be decreased oral intake from acute illness -Continue fluids and trend BMP  Metabolic acidosis - presumed from renal failure - s/p bicarb drip; responded well - transition IVF to LR - trend BMP  Diabetic foot infection ([place]) - s/p L BKA on 08/08/20. Stump looks okay  Diabetes mellitus with peripheral vascular disease ([place]) - last A1c 8% on 06/21/20 - continue SSI and CBG monitoring - watch glucose while on steroids; will adjust regimen as needed  HTN (hypertension) - Hold home meds given soft blood pressures on admission  PAD (peripheral artery disease) ([place]) - s/p L BKA on 08/08/20  Tobacco abuse - nicotine patch if needed   Old records reviewed in assessment of this patient  Antimicrobials: Cefepime 09/26/2020 >> current Vancomycin 09/26/2020 >> current  DVT prophylaxis: heparin injection 5,000 Units Start: 09/25/20 2315   Code Status:   Code Status: Full Code Family Communication:   Disposition Plan: Status is: Inpatient  Remains inpatient appropriate because:Unsafe d/c plan, IV treatments appropriate due to intensity of illness or inability to take PO, and Inpatient level of care appropriate due to severity of illness  Dispo: The patient is from: SNF              Anticipated d/c is to: SNF              Patient currently is not medically stable to d/c.   Difficult  to place patient No  Risk of unplanned readmission score: Unplanned Admission- Pilot do not use: 24.53   Objective: Blood pressure (!) 132/53, pulse 68, temperature 98.5 F (36.9 C), temperature source Rectal, resp. rate 13, height _0  (1.626 m), weight 89.3 kg, SpO2 96 %.  Examination: General appearance: cooperative, fatigued, no  distress, and slowed mentation Head: Normocephalic, without obvious abnormality, atraumatic Eyes:  EOMI Lungs: clear to auscultation bilaterally Heart: regular rate and rhythm and S1, S2 normal Abdomen: normal findings: bowel sounds normal and soft, non-tender Extremities:  L BKA noted with well healing stump (appropriately TTP) Skin: mobility and turgor normal Neurologic: Grossly normal  Consultants:    Procedures:    Data Reviewed: I have personally reviewed following labs and imaging studies Results for orders placed or performed during the hospital encounter of 09/25/20 (from the past 24 hour(s))  CBG monitoring, ED     Status: Abnormal   Collection Time: 09/25/20  7:44 PM  Result Value Ref Range   Glucose-Capillary 223 (H) 70 - 99 mg/dL  Resp Panel by RT-PCR (Flu A&B, Covid) Nasopharyngeal Swab     Status: Abnormal   Collection Time: 09/25/20  7:46 PM   Specimen: Nasopharyngeal Swab; Nasopharyngeal(NP) swabs in vial transport medium  Result Value Ref Range   SARS Coronavirus 2 by RT PCR POSITIVE (A) NEGATIVE   Influenza A by PCR NEGATIVE NEGATIVE   Influenza B by PCR NEGATIVE NEGATIVE  Glucose, capillary     Status: Abnormal   Collection Time: 09/25/20 10:54 PM  Result Value Ref Range   Glucose-Capillary 203 (H) 70 - 99 mg/dL  Creatinine, serum     Status: Abnormal   Collection Time: 09/26/20  1:53 AM  Result Value Ref Range   Creatinine, Ser 2.69 (H) 0.44 - 1.00 mg/dL   GFR, Estimated 19 (L) >60 mL/min  Comprehensive metabolic panel     Status: Abnormal   Collection Time: 09/26/20  1:53 AM  Result Value Ref Range   Sodium 137 135 - 145 mmol/L   Potassium 5.2 (H) 3.5 - 5.1 mmol/L   Chloride 106 98 - 111 mmol/L   CO2 18 (L) 22 - 32 mmol/L   Glucose, Bld 213 (H) 70 - 99 mg/dL   BUN 53 (H) 8 - 23 mg/dL   Creatinine, Ser 2.68 (H) 0.44 - 1.00 mg/dL   Calcium 8.9 8.9 - 10.3 mg/dL   Total Protein 6.3 (L) 6.5 - 8.1 g/dL   Albumin 3.1 (L) 3.5 - 5.0 g/dL   AST 17 15 - 41  U/L   ALT 21 0 - 44 U/L   Alkaline Phosphatase 118 38 - 126 U/L   Total Bilirubin 2.0 (H) 0.3 - 1.2 mg/dL   GFR, Estimated 19 (L) >60 mL/min   Anion gap 13 5 - 15  CBC     Status: Abnormal   Collection Time: 09/26/20  1:53 AM  Result Value Ref Range   WBC 17.0 (H) 4.0 - 10.5 K/uL   RBC 3.71 (L) 3.87 - 5.11 MIL/uL   Hemoglobin 11.3 (L) 12.0 - 15.0 g/dL   HCT 36.6 36.0 - 46.0 %   MCV 98.7 80.0 - 100.0 fL   MCH 30.5 26.0 - 34.0 pg   MCHC 30.9 30.0 - 36.0 g/dL   RDW 15.9 (H) 11.5 - 15.5 %   Platelets 197 150 - 400 K/uL   nRBC 0.0 0.0 - 0.2 %  Urinalysis, Routine w reflex microscopic Urine, Clean Catch  Status: Abnormal   Collection Time: 09/26/20  3:30 AM  Result Value Ref Range   Color, Urine YELLOW YELLOW   APPearance HAZY (A) CLEAR   Specific Gravity, Urine 1.010 1.005 - 1.030   pH 5.0 5.0 - 8.0   Glucose, UA NEGATIVE NEGATIVE mg/dL   Hgb urine dipstick SMALL (A) NEGATIVE   Bilirubin Urine NEGATIVE NEGATIVE   Ketones, ur NEGATIVE NEGATIVE mg/dL   Protein, ur 30 (A) NEGATIVE mg/dL   Nitrite NEGATIVE NEGATIVE   Leukocytes,Ua LARGE (A) NEGATIVE   RBC / HPF 0-5 0 - 5 RBC/hpf   WBC, UA >50 (H) 0 - 5 WBC/hpf   Bacteria, UA MANY (A) NONE SEEN   Squamous Epithelial / LPF 0-5 0 - 5   Mucus PRESENT   MRSA PCR Screening     Status: None   Collection Time: 09/26/20  3:30 AM  Result Value Ref Range   MRSA by PCR NEGATIVE NEGATIVE  Lactic acid, plasma     Status: None   Collection Time: 09/26/20  7:58 AM  Result Value Ref Range   Lactic Acid, Venous 1.4 0.5 - 1.9 mmol/L  Procalcitonin - Baseline     Status: None   Collection Time: 09/26/20  7:58 AM  Result Value Ref Range   Procalcitonin 140.09 ng/mL  Basic metabolic panel     Status: Abnormal   Collection Time: 09/26/20  2:25 PM  Result Value Ref Range   Sodium 136 135 - 145 mmol/L   Potassium 4.8 3.5 - 5.1 mmol/L   Chloride 100 98 - 111 mmol/L   CO2 26 22 - 32 mmol/L   Glucose, Bld 299 (H) 70 - 99 mg/dL   BUN 52 (H) 8  - 23 mg/dL   Creatinine, Ser 2.15 (H) 0.44 - 1.00 mg/dL   Calcium 8.6 (L) 8.9 - 10.3 mg/dL   GFR, Estimated 25 (L) >60 mL/min   Anion gap 10 5 - 15    Recent Results (from the past 240 hour(s))  Resp Panel by RT-PCR (Flu A&B, Covid) Nasopharyngeal Swab     Status: Abnormal   Collection Time: 09/25/20  7:46 PM   Specimen: Nasopharyngeal Swab; Nasopharyngeal(NP) swabs in vial transport medium  Result Value Ref Range Status   SARS Coronavirus 2 by RT PCR POSITIVE (A) NEGATIVE Final    Comment: RESULT CALLED TO, READ BACK BY AND VERIFIED WITH: J LOUDERMILK AT 2050 ON 09/25/2020 BY MOSLEY,J (NOTE) SARS-CoV-2 target nucleic acids are DETECTED.  The SARS-CoV-2 RNA is generally detectable in upper respiratory specimens during the acute phase of infection. Positive results are indicative of the presence of the identified virus, but do not rule out bacterial infection or co-infection with other pathogens not detected by the test. Clinical correlation with patient history and other diagnostic information is necessary to determine patient infection status. The expected result is Negative.  Fact Sheet for Patients: EntrepreneurPulse.com.au  Fact Sheet for Healthcare Providers: IncredibleEmployment.be  This test is not yet approved or cleared by the [place] FDA and  has been authorized for detection and/or diagnosis of SARS-CoV-2 by FDA under an Emergency Use Authorization (EUA).  This EUA will remain in effect (meaning this  test can be used) for the duration of  the COVID-19 declaration under Section 564(b)(1) of the Act, 21 U.S.C. section 360bbb-3(b)(1), unless the authorization is terminated or revoked sooner.     Influenza A by PCR NEGATIVE NEGATIVE Final   Influenza B by PCR NEGATIVE NEGATIVE Final  Comment: (NOTE) The Xpert Xpress SARS-CoV-2/FLU/RSV plus assay is intended as an aid in the diagnosis of influenza from Nasopharyngeal swab  specimens and should not be used as a sole basis for treatment. Nasal washings and aspirates are unacceptable for Xpert Xpress SARS-CoV-2/FLU/RSV testing.  Fact Sheet for Patients: EntrepreneurPulse.com.au  Fact Sheet for Healthcare Providers: IncredibleEmployment.be  This test is not yet approved or cleared by the [place] FDA and has been authorized for detection and/or diagnosis of SARS-CoV-2 by FDA under an Emergency Use Authorization (EUA). This EUA will remain in effect (meaning this test can be used) for the duration of the COVID-19 declaration under Section 564(b)(1) of the Act, 21 U.S.C. section 360bbb-3(b)(1), unless the authorization is terminated or revoked.  Performed at [facility], [place] [address], [place], [place] [address]   MRSA PCR Screening     Status: None   Collection Time: 09/26/20  3:30 AM  Result Value Ref Range Status   MRSA by PCR NEGATIVE NEGATIVE Final    Comment:        The GeneXpert MRSA Assay (FDA approved for NASAL specimens only), is one component of a comprehensive MRSA colonization surveillance program. It is not intended to diagnose MRSA infection nor to guide or monitor treatment for MRSA infections. Performed at [facility], [place] [address] [place], [place] [address]      Radiology Studies: DG ABD ACUTE 2+V W 1V CHEST  Result Date: 09/25/2020 CLINICAL DATA:  Cough vomiting EXAM: DG ABDOMEN ACUTE WITH 1 VIEW CHEST COMPARISON:  08/03/2020, 08/05/2020 FINDINGS: Single-view chest demonstrates no focal opacity or pleural effusion. Normal cardiomediastinal silhouette with aortic atherosclerosis. Calcified granuloma on the left. Supine and upright views of the abdomen demonstrate no free air beneath the diaphragm. Gas-filled nondilated small bowel in the left mid abdomen with scattered distal gas. No radiopaque calculi IMPRESSION: No acute cardiopulmonary disease.  Overall nonobstructed bowel gas pattern Electronically Signed   By: Donavan Foil M.D.   On: 09/25/2020 18:58   DG ABD ACUTE 2+V W 1V CHEST  Final Result      Scheduled Meds:  feeding supplement  1 Container Oral BID BM   heparin  5,000 Units Subcutaneous Q8H   methylPREDNISolone (SOLU-MEDROL) injection  60 mg Intravenous BID   PRN Meds: acetaminophen, acetaminophen **OR** [DISCONTINUED] acetaminophen, calcium carbonate (dosed in mg elemental calcium), camphor-menthol **AND** hydrOXYzine, docusate sodium, ondansetron **OR** ondansetron (ZOFRAN) IV, sorbitol, zolpidem Continuous Infusions:  ceFEPime (MAXIPIME) IV     lactated ringers     [START ON 09/27/2020] remdesivir 100 mg in NS 100 mL     [START ON 09/28/2020] vancomycin       LOS: 1 day  Time spent: Greater than 50% of the 35 minute visit was spent in counseling/coordination of care for the patient as laid out in the A&P.   Dwyane Dee, MD [facility] 09/26/2020, 4:11 PM

## 2020-09-26 NOTE — Assessment & Plan Note (Addendum)
-   Resume amlodipine - Continue holding Coreg and hydralazine

## 2020-09-26 NOTE — Assessment & Plan Note (Addendum)
-  patient has history of CKD3b. Baseline creat ~ 1.4, eGFR 42 - patient presents with increase in creat >0.3 mg/dL above baseline, creat increase >1.5x baseline presumed to have occurred within past 7 days PTA - creat 2.56 on admission; etiology presumed to be decreased oral intake from acute illness -Creatinine has responded well to fluids and her appetite has improved - Discontinue fluids and monitor renal function off

## 2020-09-26 NOTE — Assessment & Plan Note (Signed)
-   s/p L BKA on 08/08/20

## 2020-09-26 NOTE — Assessment & Plan Note (Signed)
-   nicotine patch if needed

## 2020-09-26 NOTE — Assessment & Plan Note (Signed)
-   Treated on admission - Trend BMP

## 2020-09-26 NOTE — Assessment & Plan Note (Signed)
-   s/p L BKA on 08/08/20. Stump looks okay

## 2020-09-26 NOTE — Assessment & Plan Note (Addendum)
-   last A1c 8% on 06/21/20 - continue SSI and CBG monitoring - stopped steroids on 6/27. CBGs should start to improve now

## 2020-09-26 NOTE — Plan of Care (Signed)

## 2020-09-26 NOTE — Progress Notes (Signed)
Initial Nutrition Assessment  DOCUMENTATION CODES:   Obesity unspecified  INTERVENTION:  Provide Boost Breeze po BID, each supplement provides 250 kcal and 9 grams of protein.  Encourage adequate PO intake.  NUTRITION DIAGNOSIS:   Increased nutrient needs related to catabolic illness (COVID) as evidenced by estimated needs.  GOAL:   Patient will meet greater than or equal to 90% of their needs  MONITOR:   PO intake, Supplement acceptance, Skin, Weight trends, Labs, I & O's  REASON FOR ASSESSMENT:   Malnutrition Screening Tool    ASSESSMENT:   65 y.o. female with medical history significant of hypertension, diabetes, peripheral vascular disease, tobacco abuse, recent necrotizing fasciitis with left below-knee amputation in May, history of femoral-tibial bypass surgery. Presents with n/v. Pt with AKI, COVID positive.  Pt reports having a good appetite. Meal completion 100%. Pt reports no n/v at time of contact. Pt from nursing facility and reports poor po intake due to dislike of food. Pt however reports favoring the hospital food currently and reports good intake with no difficulties. RD to order nutritional supplements to aid in adequate nutrition. Pt reports dislike of Ensure. RD to order Boost Breeze instead. Per weight records, pt with a 10% weight loss in 1 month, significant for time frame, however reports weight loss associated with L BKA done last month.  Unable to complete Nutrition-Focused physical exam at this time.   Labs and medications reviewed.   Diet Order:   Diet Order             Diet renal/carb modified with fluid restriction Diet-HS Snack? Nothing; Fluid restriction: 1200 mL Fluid; Room service appropriate? Yes; Fluid consistency: Thin  Diet effective now                   EDUCATION NEEDS:   Not appropriate for education at this time  Skin:  Skin Assessment: Reviewed RN Assessment  Last BM:  6/21  Height:   Ht Readings from Last 1  Encounters:  09/25/20 5\' 4"  (1.626 m)    Weight:   Wt Readings from Last 1 Encounters:  09/25/20 89.3 kg   BMI:  Body mass index is 33.79 kg/m.  Estimated Nutritional Needs:   Kcal:  2000-2200  Protein:  100-110 grams  Fluid:  1.2 L/day  09/27/20, MS, RD, LDN RD pager number/after hours weekend pager number on Amion.

## 2020-09-26 NOTE — Progress Notes (Signed)
Pharmacy Antibiotic Note  Shirley Chen is a 65 y.o. female admitted on 09/25/2020 with sepsis.  PMH significant for HTN, DM, PVD, recent necrotizing fasciitis with left BKA May 2022.  Pharmacy has been consulted for Vancomycin and Cefepime dosing.  Plan: Vancomycin 1750mg  IV x 1 followed by 1000 mg IV Q 48 hrs. Goal AUC 400-550.  Expected AUC: 542.8  SCr used: 2.56 Cefepime 2gm IV q24h Follow renal function F/U culture results and sensitivities  Height: 5\' 4"  (162.6 cm) Weight: 90.7 kg (200 lb) IBW/kg (Calculated) : 54.7  Temp (24hrs), Avg:101.8 F (38.8 C), Min:97.8 F (36.6 C), Max:105.7 F (40.9 C)  Recent Labs  Lab 09/25/20 1417  WBC 17.5*  CREATININE 2.56*    Estimated Creatinine Clearance: 24.2 mL/min (A) (by C-G formula based on SCr of 2.56 mg/dL (H)).    Allergies  Allergen Reactions   Losartan Nausea And Vomiting   Morphine And Related     Pt states " I just got really sick"    Antimicrobials this admission: 6/22 Cefepime >>   6/22 Vancomycin >>   6/22 Remdesivir >>  Dose adjustments this admission:    Microbiology results: 6/21 Covid-19: positive 6/21 BCx: ordered  Thank you for allowing pharmacy to be a part of this patient's care.  7/21, PharmD 09/26/2020 12:17 AM

## 2020-09-26 NOTE — Assessment & Plan Note (Signed)
-   continue remdesivir and steroids for now - unclear if was truly hypoxic on admission; now weaned to RA on 6/22 - trend inflammatory markers

## 2020-09-26 NOTE — Assessment & Plan Note (Addendum)
-   presumed from renal failure - s/p bicarb drip; responded well - transitioned IVF to LR; d/c IVF on 6/24 - trend BMP

## 2020-09-26 NOTE — Progress Notes (Signed)
Inpatient Diabetes Program Recommendations  AACE/ADA: New Consensus Statement on Inpatient Glycemic Control   Target Ranges:  Prepandial:   less than 140 mg/dL      Peak postprandial:   less than 180 mg/dL (1-2 hours)      Critically ill patients:  140 - 180 mg/dL  Results for Shirley Chen, Shirley Chen (MRN [id number]) as of 09/26/2020 13:41  Ref. Range 09/26/2020 01:53  Glucose Latest Ref Range: 70 - 99 mg/dL 702 (H)   Results for Shirley Chen, Shirley Chen (MRN [id number]) as of 09/26/2020 13:41  Ref. Range 08/11/2020 06:09 08/11/2020 11:10 09/25/2020 12:09 09/25/2020 19:44 09/25/2020 22:54  Glucose-Capillary Latest Ref Range: 70 - 99 mg/dL 277 (H) 412 (H) 878 (H) 223 (H) 203 (H)    Review of Glycemic Control  Diabetes history: DM2 Outpatient Diabetes medications: Lantus 26 units QHS, Novolog 0-15 units TID with meals, Metformin 500 mg BID Current orders for Inpatient glycemic control: Solumedrol 60 mg BID  Inpatient Diabetes Program Recommendations:    Insulin: Please consider ordering CBGs AC&HS, Lantus 10 units Q24H, and Novolog 0-15 units TID with meals and Novolog 0-5 units QHS.  Thanks, Orlando Penner, RN, MSN, CDE Diabetes Coordinator Inpatient Diabetes Program [phone] (Team Pager from 8am to 5pm)

## 2020-09-26 NOTE — Hospital Course (Signed)
Shirley Chen is a 64 y.o. female with medical history significant of hypertension, diabetes, peripheral vascular disease, tobacco abuse, recent necrotizing fasciitis with left below-knee amputation in May, history of femoral-tibial bypass surgery in the past who was discharged to the skilled facility where she resides.   She presented to the hospital with nausea/vomiting. On work-up she was found to be febrile, 105 degrees, lethargic.  There was concern for underlying sepsis and she was started on vancomycin and cefepime for empiric coverage.  COVID-19 testing also came back positive and she had been placed on oxygen for low normal saturations. Further work-up revealed AKI and she was started on fluids as well.

## 2020-09-26 NOTE — Assessment & Plan Note (Addendum)
-   febrile, tachypnea, leukocytosis, initially on O2 on admission - now weaned off O2 - possible source considered covid vs other u/k source currently - continue empiric vanc and cefepime; low threshold to add Flagyl if further decline or not much improvement - Trend procalcitonin (140 >>133>>54) - lactic normal -bacterial source presented itself on 6/23, 1/2 blood cx positive for E Coli ESBL. Abx modified from vanc/cefepime to monotherapy meropenem - repeat blood culture on 6/24

## 2020-09-26 NOTE — Progress Notes (Signed)
   09/25/20 2305  Assess: MEWS Score  Temp (!) 105.7 F (40.9 C)  BP 126/63  Pulse Rate 93  Resp (!) 26  SpO2 94 %  O2 Device Nasal Cannula  O2 Flow Rate (L/min) 2 L/min  Assess: MEWS Score  MEWS Temp 2  MEWS Systolic 0  MEWS Pulse 0  MEWS RR 2  MEWS LOC 0  MEWS Score 4  MEWS Score Color Red  Assess: if the MEWS score is Yellow or Red  Were vital signs taken at a resting state? Yes  Does the patient meet 2 or more of the SIRS criteria? Yes  Does the patient have a confirmed or suspected source of infection? Yes  Provider and Rapid Response Notified? Yes  MEWS guidelines implemented *See Row Information* Yes  Treat  Pain Scale Faces  Faces Pain Scale 0  Take Vital Signs  Increase Vital Sign Frequency  Red: Q 1hr X 4 then Q 4hr X 4, if remains red, continue Q 4hrs  Escalate  MEWS: Escalate Red: discuss with charge nurse/RN and provider, consider discussing with RRT  Notify: Charge Nurse/RN  Name of Charge Nurse/RN Notified Cephus Slater, RN  Date Charge Nurse/RN Notified 09/25/20  Time Charge Nurse/RN Notified 2300  Notify: Provider  Provider Name/Title Colin Benton, MD  Date Provider Notified 09/25/20  Time Provider Notified 2310  Notification Type Page  Notification Reason Change in status  Provider response In department;See new orders  Date of Provider Response 09/25/20  Time of Provider Response 2315  Notify: Rapid Response  Name of Rapid Response RN Notified Janell, RN  Date Rapid Response Notified 09/25/20  Time Rapid Response Notified 2310  Assess: SIRS CRITERIA  SIRS Temperature  1  SIRS Pulse 1  SIRS Respirations  1  SIRS WBC 1  SIRS Score Sum  4

## 2020-09-27 LAB — COMPREHENSIVE METABOLIC PANEL
ALT: 19 U/L (ref 0–44)
AST: 15 U/L (ref 15–41)
Albumin: 2.9 g/dL — ABNORMAL LOW (ref 3.5–5.0)
Alkaline Phosphatase: 131 U/L — ABNORMAL HIGH (ref 38–126)
Anion gap: 9 (ref 5–15)
BUN: 45 mg/dL — ABNORMAL HIGH (ref 8–23)
CO2: 27 mmol/L (ref 22–32)
Calcium: 8.5 mg/dL — ABNORMAL LOW (ref 8.9–10.3)
Chloride: 102 mmol/L (ref 98–111)
Creatinine, Ser: 1.64 mg/dL — ABNORMAL HIGH (ref 0.44–1.00)
GFR, Estimated: 35 mL/min — ABNORMAL LOW (ref >60)
Glucose, Bld: 326 mg/dL — ABNORMAL HIGH (ref 70–99)
Potassium: 4.1 mmol/L (ref 3.5–5.1)
Sodium: 138 mmol/L (ref 135–145)
Total Bilirubin: 0.9 mg/dL (ref 0.3–1.2)
Total Protein: 6.3 g/dL — ABNORMAL LOW (ref 6.5–8.1)

## 2020-09-27 LAB — BLOOD CULTURE ID PANEL (REFLEXED) - BCID2

## 2020-09-27 LAB — CBC WITH DIFFERENTIAL/PLATELET
Abs Immature Granulocytes: 0.12 10*3/uL — ABNORMAL HIGH (ref 0.00–0.07)
Basophils Absolute: 0 10*3/uL (ref 0.0–0.1)
Basophils Relative: 0 %
Eosinophils Absolute: 0 10*3/uL (ref 0.0–0.5)
Eosinophils Relative: 0 %
HCT: 36 % (ref 36.0–46.0)
Hemoglobin: 11.5 g/dL — ABNORMAL LOW (ref 12.0–15.0)
Immature Granulocytes: 1 %
Lymphocytes Relative: 6 %
Lymphs Abs: 0.8 10*3/uL (ref 0.7–4.0)
MCH: 29.7 pg (ref 26.0–34.0)
MCHC: 31.9 g/dL (ref 30.0–36.0)
MCV: 93 fL (ref 80.0–100.0)
Monocytes Absolute: 0.6 10*3/uL (ref 0.1–1.0)
Monocytes Relative: 5 %
Neutro Abs: 11.2 10*3/uL — ABNORMAL HIGH (ref 1.7–7.7)
Neutrophils Relative %: 88 %
Platelets: 229 10*3/uL (ref 150–400)
RBC: 3.87 MIL/uL (ref 3.87–5.11)
RDW: 15.6 % — ABNORMAL HIGH (ref 11.5–15.5)
WBC: 12.7 10*3/uL — ABNORMAL HIGH (ref 4.0–10.5)
nRBC: 0 % (ref 0.0–0.2)

## 2020-09-27 LAB — GLUCOSE, CAPILLARY
Glucose-Capillary: 316 mg/dL — ABNORMAL HIGH (ref 70–99)
Glucose-Capillary: 319 mg/dL — ABNORMAL HIGH (ref 70–99)
Glucose-Capillary: 325 mg/dL — ABNORMAL HIGH (ref 70–99)
Glucose-Capillary: 318 mg/dL — ABNORMAL HIGH (ref 70–99)

## 2020-09-27 LAB — PROCALCITONIN: Procalcitonin: 133.3 ng/mL

## 2020-09-27 LAB — HEMOGLOBIN A1C
Hgb A1c MFr Bld: 7 % — ABNORMAL HIGH (ref 4.8–5.6)
Mean Plasma Glucose: 154.2 mg/dL

## 2020-09-27 LAB — MAGNESIUM: Magnesium: 2 mg/dL (ref 1.7–2.4)

## 2020-09-27 MED ORDER — INSULIN GLARGINE 100 UNIT/ML ~~LOC~~ SOLN
10.0000 [IU] | Freq: Every day | SUBCUTANEOUS | Status: DC
  Administered 2020-09-27 – 2020-09-28 (×2): 10 [IU] via SUBCUTANEOUS
  Filled 2020-09-27 (×2): qty 0.1

## 2020-09-27 MED ORDER — GABAPENTIN 300 MG PO CAPS
300.0000 mg | ORAL_CAPSULE | Freq: Three times a day (TID) | ORAL | Status: DC
  Administered 2020-09-27 – 2020-10-04 (×22): 300 mg via ORAL
  Filled 2020-09-27 (×22): qty 1

## 2020-09-27 MED ORDER — SODIUM CHLORIDE 0.9 % IV SOLN
1.0000 g | Freq: Two times a day (BID) | INTRAVENOUS | Status: DC
  Filled 2020-09-27 (×2): qty 1

## 2020-09-27 MED ORDER — SODIUM CHLORIDE 0.9 % IV SOLN
2.0000 g | Freq: Two times a day (BID) | INTRAVENOUS | Status: DC
  Administered 2020-09-27: 2 g via INTRAVENOUS
  Filled 2020-09-27: qty 2

## 2020-09-27 MED ORDER — VANCOMYCIN HCL 1250 MG/250ML IV SOLN: 1250.0000 mg | INTRAVENOUS | Status: DC

## 2020-09-27 NOTE — Progress Notes (Signed)
Inpatient Diabetes Program Recommendations  AACE/ADA: New Consensus Statement on Inpatient Glycemic Control (2015)  Target Ranges:  Prepandial:   less than 140 mg/dL      Peak postprandial:   less than 180 mg/dL (1-2 hours)      Critically ill patients:  140 - 180 mg/dL   Lab Results  Component Value Date   GLUCAP 316 (H) 09/27/2020   HGBA1C 7.0 (H) 09/26/2020    Review of Glycemic Control Results for Shirley Chen, Shirley Chen (MRN [id number]) as of 09/27/2020 10:01  Ref. Range 09/25/2020 12:09 09/25/2020 19:44 09/25/2020 22:54 09/26/2020 20:58 09/27/2020 08:04  Glucose-Capillary Latest Ref Range: 70 - 99 mg/dL 416 (H) 606 (H) 301 (H) 338 (H) 316 (H)  Diabetes history: DM2 Outpatient Diabetes medications: Lantus 26 units QHS, Novolog 0-15 units TID with meals, Metformin 500 mg BID Current orders for Inpatient glycemic control: Solumedrol 60 mg BID Novolog sensitive tid with meals and HS, Lantus 10 units daily Inpatient Diabetes Program Recommendations:    Please increase Lantus to 26 units q HS and add Novolog meal coverage 3 units tid with meals.   Thanks,  Beryl Meager, RN, BC-ADM Inpatient Diabetes Coordinator Pager [phone]  (8a-5p)

## 2020-09-27 NOTE — Progress Notes (Signed)
Pharmacy Antibiotic Note  Shirley Chen is a 65 y.o. female with hx necrotizing fasciitis with left below-knee amputation in May and recent COVID infection presented to the ED from skilled nursing facility on 09/25/2020 with c/o n/v.  She was started on vancomycin and cefepime on admission for sepsis.  Today, 09/27/2020: - Afeb, wbc 12.7 - PCT down 133 - scr improved to 1.64 (sodium bicarb drip d/ced on 6/22)   Plan: - adjust cefepime to 2gm q12h and vancomycin to 1250mg  IV q48h for est AUC 480 - monitor renal function closely  ___________________________________________  Height: 5\' 4"  (162.6 cm) Weight: 89.3 kg (196 lb 13.9 oz) IBW/kg (Calculated) : 54.7  Temp (24hrs), Avg:98.1 F (36.7 C), Min:97.6 F (36.4 C), Max:98.5 F (36.9 C)  Recent Labs  Lab 09/25/20 1417 09/26/20 0153 09/26/20 0758 09/26/20 1425 09/27/20 0414  WBC 17.5* 17.0*  --   --  12.7*  CREATININE 2.56* 2.69*  2.68*  --  2.15* 1.64*  LATICACIDVEN  --   --  1.4  --   --     Estimated Creatinine Clearance: 37.5 mL/min (A) (by C-G formula based on SCr of 1.64 mg/dL (H)).    Allergies  Allergen Reactions   Losartan Nausea And Vomiting   Morphine And Related     Pt states " I just got really sick"     Thank you for allowing pharmacy to be a part of this patient's care.  09/28/20 09/27/2020 12:08 PM

## 2020-09-27 NOTE — Progress Notes (Signed)
PHARMACY - PHYSICIAN COMMUNICATION CRITICAL VALUE ALERT - BLOOD CULTURE IDENTIFICATION (BCID)  Shirley Chen is an 65 y.o. female who presented to [facility] on 09/25/2020 with a chief complaint of emesis  Assessment: unknown source  Name of physician (or Provider) Contacted: D. Girguis via Secure Chat  Current antibiotics: vancomycin & Cefepime  Changes to prescribed antibiotics recommended:  Change to Meropenem 1 gm IV q12  Results for orders placed or performed during the hospital encounter of 09/25/20  Blood Culture ID Panel (Reflexed) (Collected: 09/26/2020  1:40 AM)  Result Value Ref Range   Enterococcus faecalis NOT DETECTED NOT DETECTED   Enterococcus Faecium NOT DETECTED NOT DETECTED   Listeria monocytogenes NOT DETECTED NOT DETECTED   Staphylococcus species NOT DETECTED NOT DETECTED   Staphylococcus aureus (BCID) NOT DETECTED NOT DETECTED   Staphylococcus epidermidis NOT DETECTED NOT DETECTED   Staphylococcus lugdunensis NOT DETECTED NOT DETECTED   Streptococcus species NOT DETECTED NOT DETECTED   Streptococcus agalactiae NOT DETECTED NOT DETECTED   Streptococcus pneumoniae NOT DETECTED NOT DETECTED   Streptococcus pyogenes NOT DETECTED NOT DETECTED   A.calcoaceticus-baumannii NOT DETECTED NOT DETECTED   Bacteroides fragilis NOT DETECTED NOT DETECTED   Enterobacterales DETECTED (A) NOT DETECTED   Enterobacter cloacae complex NOT DETECTED NOT DETECTED   Escherichia coli DETECTED (A) NOT DETECTED   Klebsiella aerogenes NOT DETECTED NOT DETECTED   Klebsiella oxytoca NOT DETECTED NOT DETECTED   Klebsiella pneumoniae NOT DETECTED NOT DETECTED   Proteus species NOT DETECTED NOT DETECTED   Salmonella species NOT DETECTED NOT DETECTED   Serratia marcescens NOT DETECTED NOT DETECTED   Haemophilus influenzae NOT DETECTED NOT DETECTED   Neisseria meningitidis NOT DETECTED NOT DETECTED   Pseudomonas aeruginosa NOT DETECTED NOT DETECTED   Stenotrophomonas maltophilia NOT  DETECTED NOT DETECTED   Candida albicans NOT DETECTED NOT DETECTED   Candida auris NOT DETECTED NOT DETECTED   Candida glabrata NOT DETECTED NOT DETECTED   Candida krusei NOT DETECTED NOT DETECTED   Candida parapsilosis NOT DETECTED NOT DETECTED   Candida tropicalis NOT DETECTED NOT DETECTED   Cryptococcus neoformans/gattii NOT DETECTED NOT DETECTED   CTX-M ESBL DETECTED (A) NOT DETECTED   Carbapenem resistance IMP NOT DETECTED NOT DETECTED   Carbapenem resistance KPC NOT DETECTED NOT DETECTED   Carbapenem resistance NDM NOT DETECTED NOT DETECTED   Carbapenem resist OXA 48 LIKE NOT DETECTED NOT DETECTED   Carbapenem resistance VIM NOT DETECTED NOT DETECTED    Herby Abraham, Pharm.D 09/27/2020 4:32 PM

## 2020-09-27 NOTE — Progress Notes (Signed)
Progress Note    Shirley Chen   WIO:[id number]  DOB: Sep 10, 1955  DOA: 09/25/2020     2  PCP: Marrian Salvage, FNP  CC: weakness, N/V  Hospital Course: Shirley Chen is a 65 y.o. female with medical history significant of hypertension, diabetes, peripheral vascular disease, tobacco abuse, recent necrotizing fasciitis with left below-knee amputation in May, history of femoral-tibial bypass surgery in the past who was discharged to the skilled facility where she resides.   She presented to the hospital with nausea/vomiting. On work-up she was found to be febrile, 105 degrees, lethargic.  There was concern for underlying sepsis and she was started on vancomycin and cefepime for empiric coverage.  COVID-19 testing also came back positive and she had been placed on oxygen for low normal saturations. Further work-up revealed AKI and she was started on fluids as well.  Interval History:  No events overnight.  Looks better this morning in terms of her color and level of energy.  ROS: Constitutional: positive for chills, fatigue, fevers, and malaise, negative for night sweats, Respiratory: negative for cough and sputum, Cardiovascular: negative for chest pain, and Gastrointestinal: negative for abdominal pain  Assessment & Plan: * Hyperkalemia-resolved as of 09/26/2020 - Treated on admission - Trend BMP  COVID-19 virus infection - continue remdesivir and steroids for now - unclear if was truly hypoxic on admission; now weaned to RA on 6/22 - trend inflammatory markers   Severe sepsis (HCC) - febrile, tachypnea, leukocytosis, initially on O2 on admission - now weaned off O2 - possible source considered covid vs other u/k source currently - continue empiric vanc and cefepime; low threshold to add Flagyl if further decline or not much improvement - Trend procalcitonin (140 >>133>>) - lactic normal -Still no obvious source but clinically improving.  continue antibiotics and will  de-escalate as able  Acute renal failure superimposed on stage 3b chronic kidney disease ([place]) - patient has history of CKD3b. Baseline creat ~ 1.4, eGFR 42 - patient presents with increase in creat >0.3 mg/dL above baseline, creat increase >1.5x baseline presumed to have occurred within past 7 days PTA - creat 2.56 on admission; etiology presumed to be decreased oral intake from acute illness -Continue fluids and trend BMP -Renal function improving while on fluids, continue  Metabolic acidosis - presumed from renal failure - s/p bicarb drip; responded well - transition IVF to LR - trend BMP  Diabetic foot infection ([place]) - s/p L BKA on 08/08/20. Stump looks okay  Diabetes mellitus with peripheral vascular disease ([place]) - last A1c 8% on 06/21/20 - continue SSI and CBG monitoring - watch glucose while on steroids; will adjust regimen as needed  HTN (hypertension) - Hold home meds given soft blood pressures on admission  PAD (peripheral artery disease) ([place]) - s/p L BKA on 08/08/20  Tobacco abuse - nicotine patch if needed   Old records reviewed in assessment of this patient  Antimicrobials: Cefepime 09/26/2020 >> current Vancomycin 09/26/2020 >> current  DVT prophylaxis: heparin injection 5,000 Units Start: 09/25/20 2315   Code Status:   Code Status: Full Code Family Communication:   Disposition Plan: Status is: Inpatient  Remains inpatient appropriate because:Unsafe d/c plan, IV treatments appropriate due to intensity of illness or inability to take PO, and Inpatient level of care appropriate due to severity of illness  Dispo: The patient is from: SNF              Anticipated d/c is to: SNF  Patient currently is not medically stable to d/c.   Difficult to place patient No  Risk of unplanned readmission score: Unplanned Admission- Pilot do not use: 26.48   Objective: Blood pressure (!) 155/70, pulse 76, temperature (!) 97.3 F (36.3 C), temperature  source Axillary, resp. rate 16, height '5\' 4"'  (1.626 m), weight 89.3 kg, SpO2 96 %.  Examination: General appearance:  Much more awake, alert.  She is less fatigued and mentation is much improved Head: Normocephalic, without obvious abnormality, atraumatic Eyes:  EOMI Lungs: clear to auscultation bilaterally Heart: regular rate and rhythm and S1, S2 normal Abdomen: normal findings: bowel sounds normal and soft, non-tender Extremities:  L BKA noted with well healing stump (appropriately TTP) Skin: mobility and turgor normal Neurologic: Grossly normal  Consultants:    Procedures:    Data Reviewed: I have personally reviewed following labs and imaging studies Results for orders placed or performed during the hospital encounter of 09/25/20 (from the past 24 hour(s))  Glucose, capillary     Status: Abnormal   Collection Time: 09/26/20  8:58 PM  Result Value Ref Range   Glucose-Capillary 338 (H) 70 - 99 mg/dL  Hemoglobin A1c     Status: Abnormal   Collection Time: 09/26/20 10:14 PM  Result Value Ref Range   Hgb A1c MFr Bld 7.0 (H) 4.8 - 5.6 %   Mean Plasma Glucose 154.2 mg/dL  Procalcitonin     Status: None   Collection Time: 09/27/20  4:14 AM  Result Value Ref Range   Procalcitonin 133.30 ng/mL  CBC with Differential/Platelet     Status: Abnormal   Collection Time: 09/27/20  4:14 AM  Result Value Ref Range   WBC 12.7 (H) 4.0 - 10.5 K/uL   RBC 3.87 3.87 - 5.11 MIL/uL   Hemoglobin 11.5 (L) 12.0 - 15.0 g/dL   HCT 36.0 36.0 - 46.0 %   MCV 93.0 80.0 - 100.0 fL   MCH 29.7 26.0 - 34.0 pg   MCHC 31.9 30.0 - 36.0 g/dL   RDW 15.6 (H) 11.5 - 15.5 %   Platelets 229 150 - 400 K/uL   nRBC 0.0 0.0 - 0.2 %   Neutrophils Relative % 88 %   Neutro Abs 11.2 (H) 1.7 - 7.7 K/uL   Lymphocytes Relative 6 %   Lymphs Abs 0.8 0.7 - 4.0 K/uL   Monocytes Relative 5 %   Monocytes Absolute 0.6 0.1 - 1.0 K/uL   Eosinophils Relative 0 %   Eosinophils Absolute 0.0 0.0 - 0.5 K/uL   Basophils Relative 0  %   Basophils Absolute 0.0 0.0 - 0.1 K/uL   Immature Granulocytes 1 %   Abs Immature Granulocytes 0.12 (H) 0.00 - 0.07 K/uL  Comprehensive metabolic panel     Status: Abnormal   Collection Time: 09/27/20  4:14 AM  Result Value Ref Range   Sodium 138 135 - 145 mmol/L   Potassium 4.1 3.5 - 5.1 mmol/L   Chloride 102 98 - 111 mmol/L   CO2 27 22 - 32 mmol/L   Glucose, Bld 326 (H) 70 - 99 mg/dL   BUN 45 (H) 8 - 23 mg/dL   Creatinine, Ser 1.64 (H) 0.44 - 1.00 mg/dL   Calcium 8.5 (L) 8.9 - 10.3 mg/dL   Total Protein 6.3 (L) 6.5 - 8.1 g/dL   Albumin 2.9 (L) 3.5 - 5.0 g/dL   AST 15 15 - 41 U/L   ALT 19 0 - 44 U/L   Alkaline Phosphatase 131 (H)  38 - 126 U/L   Total Bilirubin 0.9 0.3 - 1.2 mg/dL   GFR, Estimated 35 (L) >60 mL/min   Anion gap 9 5 - 15  Magnesium     Status: None   Collection Time: 09/27/20  4:14 AM  Result Value Ref Range   Magnesium 2.0 1.7 - 2.4 mg/dL  Glucose, capillary     Status: Abnormal   Collection Time: 09/27/20  8:04 AM  Result Value Ref Range   Glucose-Capillary 316 (H) 70 - 99 mg/dL  Glucose, capillary     Status: Abnormal   Collection Time: 09/27/20 11:19 AM  Result Value Ref Range   Glucose-Capillary 319 (H) 70 - 99 mg/dL    Recent Results (from the past 240 hour(s))  Resp Panel by RT-PCR (Flu A&B, Covid) Nasopharyngeal Swab     Status: Abnormal   Collection Time: 09/25/20  7:46 PM   Specimen: Nasopharyngeal Swab; Nasopharyngeal(NP) swabs in vial transport medium  Result Value Ref Range Status   SARS Coronavirus 2 by RT PCR POSITIVE (A) NEGATIVE Final    Comment: RESULT CALLED TO, READ BACK BY AND VERIFIED WITH: J LOUDERMILK AT 2050 ON 09/25/2020 BY MOSLEY,J (NOTE) SARS-CoV-2 target nucleic acids are DETECTED.  The SARS-CoV-2 RNA is generally detectable in upper respiratory specimens during the acute phase of infection. Positive results are indicative of the presence of the identified virus, but do not rule out bacterial infection or co-infection  with other pathogens not detected by the test. Clinical correlation with patient history and other diagnostic information is necessary to determine patient infection status. The expected result is Negative.  Fact Sheet for Patients: EntrepreneurPulse.com.au  Fact Sheet for Healthcare Providers: IncredibleEmployment.be  This test is not yet approved or cleared by the [place] FDA and  has been authorized for detection and/or diagnosis of SARS-CoV-2 by FDA under an Emergency Use Authorization (EUA).  This EUA will remain in effect (meaning this  test can be used) for the duration of  the COVID-19 declaration under Section 564(b)(1) of the Act, 21 U.S.C. section 360bbb-3(b)(1), unless the authorization is terminated or revoked sooner.     Influenza A by PCR NEGATIVE NEGATIVE Final   Influenza B by PCR NEGATIVE NEGATIVE Final    Comment: (NOTE) The Xpert Xpress SARS-CoV-2/FLU/RSV plus assay is intended as an aid in the diagnosis of influenza from Nasopharyngeal swab specimens and should not be used as a sole basis for treatment. Nasal washings and aspirates are unacceptable for Xpert Xpress SARS-CoV-2/FLU/RSV testing.  Fact Sheet for Patients: EntrepreneurPulse.com.au  Fact Sheet for Healthcare Providers: IncredibleEmployment.be  This test is not yet approved or cleared by the [place] FDA and has been authorized for detection and/or diagnosis of SARS-CoV-2 by FDA under an Emergency Use Authorization (EUA). This EUA will remain in effect (meaning this test can be used) for the duration of the COVID-19 declaration under Section 564(b)(1) of the Act, 21 U.S.C. section 360bbb-3(b)(1), unless the authorization is terminated or revoked.  Performed at [facility], [place] [address], [place], [place] [address]   Culture, blood (Routine X 2) w Reflex to ID Panel     Status: None  (Preliminary result)   Collection Time: 09/26/20  1:40 AM   Specimen: BLOOD  Result Value Ref Range Status   Specimen Description   Final    BLOOD BLOOD LEFT HAND Performed at [facility] [address] [place], [place] [address]    Special Requests   Final  BOTTLES DRAWN AEROBIC ONLY Blood Culture adequate volume Performed at [facility] [address], [place], [place] [address]    Culture  Setup Time   Final    GRAM NEGATIVE RODS AEROBIC BOTTLE ONLY Organism ID to follow Performed at [facility], [place] [address], [place], [place] [address]    Culture GRAM NEGATIVE RODS  Final   Report Status PENDING  Incomplete  MRSA PCR Screening     Status: None   Collection Time: 09/26/20  3:30 AM  Result Value Ref Range Status   MRSA by PCR NEGATIVE NEGATIVE Final    Comment:        The GeneXpert MRSA Assay (FDA approved for NASAL specimens only), is one component of a comprehensive MRSA colonization surveillance program. It is not intended to diagnose MRSA infection nor to guide or monitor treatment for MRSA infections. Performed at [facility], [place] [address], [place], [place] [address]      Radiology Studies: DG ABD ACUTE 2+V W 1V CHEST  Result Date: 09/25/2020 CLINICAL DATA:  Cough vomiting EXAM: DG ABDOMEN ACUTE WITH 1 VIEW CHEST COMPARISON:  08/03/2020, 08/05/2020 FINDINGS: Single-view chest demonstrates no focal opacity or pleural effusion. Normal cardiomediastinal silhouette with aortic atherosclerosis. Calcified granuloma on the left. Supine and upright views of the abdomen demonstrate no free air beneath the diaphragm. Gas-filled nondilated small bowel in the left mid abdomen with scattered distal gas. No radiopaque calculi IMPRESSION: No acute cardiopulmonary disease. Overall nonobstructed bowel gas pattern Electronically Signed   By: Donavan Foil M.D.   On: 09/25/2020 18:58   DG ABD ACUTE 2+V W 1V  CHEST  Final Result      Scheduled Meds:  feeding supplement  1 Container Oral BID BM   gabapentin  300 mg Oral TID   heparin  5,000 Units Subcutaneous Q8H   insulin aspart  0-5 Units Subcutaneous QHS   insulin aspart  0-9 Units Subcutaneous TID WC   insulin glargine  10 Units Subcutaneous Daily   methylPREDNISolone (SOLU-MEDROL) injection  60 mg Intravenous BID   PRN Meds: acetaminophen, acetaminophen **OR** [DISCONTINUED] acetaminophen, calcium carbonate (dosed in mg elemental calcium), camphor-menthol **AND** hydrOXYzine, docusate sodium, ondansetron **OR** ondansetron (ZOFRAN) IV, sorbitol, zolpidem Continuous Infusions:  ceFEPime (MAXIPIME) IV 2 g (09/27/20 1339)   lactated ringers 100 mL/hr at 09/27/20 0442   remdesivir 100 mg in NS 100 mL 100 mg (09/27/20 0946)   vancomycin       LOS: 2 days  Time spent: Greater than 50% of the 35 minute visit was spent in counseling/coordination of care for the patient as laid out in the A&P.   Dwyane Dee, MD [facility] 09/27/2020, 4:13 PM

## 2020-09-27 NOTE — Tx Team (Signed)
Pt requests that members of the treatment team to please make updates to son in law  Donia Guiles @ [phone] regarding treatments, discharge plans ect as it is intended that she return home with him

## 2020-09-28 DIAGNOSIS — R7881 Bacteremia: Secondary | ICD-10-CM

## 2020-09-28 LAB — CBC WITH DIFFERENTIAL/PLATELET
Abs Immature Granulocytes: 0.2 10*3/uL — ABNORMAL HIGH (ref 0.00–0.07)
Basophils Absolute: 0 10*3/uL (ref 0.0–0.1)
Basophils Relative: 0 %
Eosinophils Absolute: 0 10*3/uL (ref 0.0–0.5)
Eosinophils Relative: 0 %
HCT: 37.7 % (ref 36.0–46.0)
Hemoglobin: 12.1 g/dL (ref 12.0–15.0)
Immature Granulocytes: 2 %
Lymphocytes Relative: 7 %
Lymphs Abs: 0.9 10*3/uL (ref 0.7–4.0)
MCH: 29.8 pg (ref 26.0–34.0)
MCHC: 32.1 g/dL (ref 30.0–36.0)
MCV: 92.9 fL (ref 80.0–100.0)
Monocytes Absolute: 1.2 10*3/uL — ABNORMAL HIGH (ref 0.1–1.0)
Monocytes Relative: 9 %
Neutro Abs: 11.2 10*3/uL — ABNORMAL HIGH (ref 1.7–7.7)
Neutrophils Relative %: 82 %
Platelets: 224 10*3/uL (ref 150–400)
RBC: 4.06 MIL/uL (ref 3.87–5.11)
RDW: 15.5 % (ref 11.5–15.5)
WBC: 13.5 10*3/uL — ABNORMAL HIGH (ref 4.0–10.5)
nRBC: 0 % (ref 0.0–0.2)

## 2020-09-28 LAB — COMPREHENSIVE METABOLIC PANEL
ALT: 19 U/L (ref 0–44)
AST: 16 U/L (ref 15–41)
Albumin: 3 g/dL — ABNORMAL LOW (ref 3.5–5.0)
Alkaline Phosphatase: 120 U/L (ref 38–126)
Anion gap: 11 (ref 5–15)
BUN: 37 mg/dL — ABNORMAL HIGH (ref 8–23)
CO2: 26 mmol/L (ref 22–32)
Calcium: 9 mg/dL (ref 8.9–10.3)
Chloride: 103 mmol/L (ref 98–111)
Creatinine, Ser: 1.07 mg/dL — ABNORMAL HIGH (ref 0.44–1.00)
GFR, Estimated: 58 mL/min — ABNORMAL LOW (ref >60)
Glucose, Bld: 253 mg/dL — ABNORMAL HIGH (ref 70–99)
Potassium: 3.9 mmol/L (ref 3.5–5.1)
Sodium: 140 mmol/L (ref 135–145)
Total Bilirubin: 0.8 mg/dL (ref 0.3–1.2)
Total Protein: 6.6 g/dL (ref 6.5–8.1)

## 2020-09-28 LAB — GLUCOSE, CAPILLARY
Glucose-Capillary: 226 mg/dL — ABNORMAL HIGH (ref 70–99)
Glucose-Capillary: 308 mg/dL — ABNORMAL HIGH (ref 70–99)
Glucose-Capillary: 393 mg/dL — ABNORMAL HIGH (ref 70–99)
Glucose-Capillary: 361 mg/dL — ABNORMAL HIGH (ref 70–99)

## 2020-09-28 LAB — PROCALCITONIN: Procalcitonin: 54.5 ng/mL

## 2020-09-28 LAB — MAGNESIUM: Magnesium: 2.1 mg/dL (ref 1.7–2.4)

## 2020-09-28 MED ORDER — SODIUM CHLORIDE 0.9 % IV SOLN
1.0000 g | Freq: Three times a day (TID) | INTRAVENOUS | Status: DC
  Administered 2020-09-28 – 2020-10-04 (×19): 1 g via INTRAVENOUS
  Filled 2020-09-28 (×20): qty 1

## 2020-09-28 MED ORDER — HYDRALAZINE HCL 20 MG/ML IJ SOLN
5.0000 mg | Freq: Three times a day (TID) | INTRAMUSCULAR | Status: DC | PRN
  Administered 2020-09-28 – 2020-09-29 (×2): 5 mg via INTRAVENOUS
  Filled 2020-09-28 (×2): qty 1

## 2020-09-28 MED ORDER — INSULIN GLARGINE 100 UNIT/ML ~~LOC~~ SOLN
26.0000 [IU] | Freq: Every day | SUBCUTANEOUS | Status: DC
  Administered 2020-09-29 – 2020-10-04 (×6): 26 [IU] via SUBCUTANEOUS
  Filled 2020-09-28 (×6): qty 0.26

## 2020-09-28 MED ORDER — INSULIN GLARGINE 100 UNIT/ML ~~LOC~~ SOLN
15.0000 [IU] | Freq: Once | SUBCUTANEOUS | Status: AC
  Administered 2020-09-28: 15 [IU] via SUBCUTANEOUS
  Filled 2020-09-28: qty 0.15

## 2020-09-28 NOTE — Progress Notes (Signed)
PHARMACY NOTE:  ANTIMICROBIAL RENAL DOSAGE ADJUSTMENT  Current antimicrobial regimen includes a mismatch between antimicrobial dosage and estimated renal function.  As per policy approved by the Pharmacy & Therapeutics and Medical Executive Committees, the antimicrobial dosage will be adjusted accordingly.  Current antimicrobial dosage:  Meropenem 1gm IV q12h  Indication: ESBL bacteremia  Renal Function:  Estimated Creatinine Clearance: 57.4 mL/min (A) (by C-G formula based on SCr of 1.07 mg/dL (H)). []      On intermittent HD, scheduled: []      On CRRT    Antimicrobial dosage has been changed to:  Meropenem 1gm IV q8h  Additional comments:   Thank you for allowing pharmacy to be a part of this patient's care.  , [facility] 09/28/2020 5:09 AM

## 2020-09-28 NOTE — Progress Notes (Signed)
Inpatient Diabetes Program Recommendations  AACE/ADA: New Consensus Statement on Inpatient Glycemic Control (2015)  Target Ranges:  Prepandial:   less than 140 mg/dL      Peak postprandial:   less than 180 mg/dL (1-2 hours)      Critically ill patients:  140 - 180 mg/dL   Lab Results  Component Value Date   GLUCAP 226 (H) 09/28/2020   HGBA1C 7.0 (H) 09/26/2020    Review of Glycemic Control Results for Shirley Chen, Shirley Chen (MRN [id number]) as of 09/28/2020 11:27  Ref. Range 09/27/2020 08:04 09/27/2020 11:19 09/27/2020 16:47 09/27/2020 20:52 09/28/2020 08:37  Glucose-Capillary Latest Ref Range: 70 - 99 mg/dL 601 (H) 093 (H) 235 (H) 318 (H) 226 (H)  Diabetes history: DM2 Outpatient Diabetes medications: Lantus 26 units QHS, Novolog 0-15 units TID with meals, Metformin 500 mg BID Current orders for Inpatient glycemic control: Solumedrol 60 mg BID Novolog sensitive tid with meals and HS, Lantus 10 units daily  Inpatient Diabetes Program Recommendations:    Please increase Lantus to 26 units q HS and add Novolog meal coverage 3 units tid with meals.  Thanks,  Beryl Meager, RN, BC-ADM Inpatient Diabetes Coordinator Pager [phone]  (8a-5p)

## 2020-09-28 NOTE — Progress Notes (Signed)
Progress Note    Shirley Chen   HAL:[id number]  DOB: [birth year]  DOA: 09/25/2020     3  PCP: Marrian Salvage, FNP  CC: weakness, N/V  Hospital Course: Shirley Chen is a 65 y.o. female with medical history significant of hypertension, diabetes, peripheral vascular disease, tobacco abuse, recent necrotizing fasciitis with left below-knee amputation in May, history of femoral-tibial bypass surgery in the past who was discharged to the skilled facility where she resides.   She presented to the hospital with nausea/vomiting. On work-up she was found to be febrile, 105 degrees, lethargic.  There was concern for underlying sepsis and she was started on vancomycin and cefepime for empiric coverage.  COVID-19 testing also came back positive and she had been placed on oxygen for low normal saturations. Further work-up revealed AKI and she was started on fluids as well.  Interval History:  No events overnight.  Energy continues to improve daily.  Feeling okay this morning.  ROS: Constitutional: positive for chills, fatigue, fevers, and malaise, negative for night sweats, Respiratory: negative for cough and sputum, Cardiovascular: negative for chest pain, and Gastrointestinal: negative for abdominal pain  Assessment & Plan: * Hyperkalemia-resolved as of 09/26/2020 - Treated on admission - Trend BMP  Bacteremia due to Gram-negative bacteria - see sepsis - unclear source; no prior known hx ESBL infections; recent LBKA may somehow be involved - continue meropenem - will discuss treatment options with ID after sensitivities result   COVID-19 virus infection - continue remdesivir and steroids for now - unclear if was truly hypoxic on admission; now weaned to RA on 6/22 - trend inflammatory markers   Severe sepsis (HCC) - febrile, tachypnea, leukocytosis, initially on O2 on admission - now weaned off O2 - possible source considered covid vs other u/k source currently - continue  empiric vanc and cefepime; low threshold to add Flagyl if further decline or not much improvement - Trend procalcitonin (140 >>133>>54) - lactic normal -bacterial source presented itself on 6/23, 1/2 blood cx positive for E Coli ESBL. Abx modified from vanc/cefepime to monotherapy meropenem - repeat blood culture on 6/24  Acute renal failure superimposed on stage 3b chronic kidney disease ([place]) - patient has history of CKD3b. Baseline creat ~ 1.4, eGFR 42 - patient presents with increase in creat >0.3 mg/dL above baseline, creat increase >1.5x baseline presumed to have occurred within past 7 days PTA - creat 2.56 on admission; etiology presumed to be decreased oral intake from acute illness -Creatinine has responded well to fluids and her appetite has improved - Discontinue fluids and monitor renal function off  Metabolic acidosis - presumed from renal failure - s/p bicarb drip; responded well - transitioned IVF to LR; d/c IVF on 6/24 - trend BMP  Diabetic foot infection ([place]) - s/p L BKA on 08/08/20. Stump looks okay  Diabetes mellitus with peripheral vascular disease ([place]) - last A1c 8% on 06/21/20 - continue SSI and CBG monitoring - watch glucose while on steroids; will adjust regimen as needed  HTN (hypertension) - Hold home meds given soft blood pressures on admission  PAD (peripheral artery disease) ([place]) - s/p L BKA on 08/08/20  Tobacco abuse - nicotine patch if needed   Old records reviewed in assessment of this patient  Antimicrobials: Cefepime 09/26/2020 >> 09/27/2020 Vancomycin 09/26/2020 >> 09/27/2020 Meropenem 09/27/2020 >> current  DVT prophylaxis: heparin injection 5,000 Units Start: 09/25/20 2315   Code Status:   Code Status: Full Code Family Communication:   Disposition Plan: Status  is: Inpatient  Remains inpatient appropriate because:Unsafe d/c plan, IV treatments appropriate due to intensity of illness or inability to take PO, and Inpatient level of care  appropriate due to severity of illness  Dispo: The patient is from: SNF              Anticipated d/c is to: SNF              Patient currently is not medically stable to d/c.   Difficult to place patient No  Risk of unplanned readmission score: Unplanned Admission- Pilot do not use: 21.3   Objective: Blood pressure (!) 170/75, pulse 61, temperature (!) 96.6 F (35.9 C), resp. rate 14, height '5\' 4"'  (1.626 m), weight 89.5 kg, SpO2 97 %.  Examination: General appearance:  Much more awake, alert.  She is less fatigued and mentation is much improved Head: Normocephalic, without obvious abnormality, atraumatic Eyes:  EOMI Lungs: clear to auscultation bilaterally Heart: regular rate and rhythm and S1, S2 normal Abdomen: normal findings: bowel sounds normal and soft, non-tender Extremities:  L BKA noted with well healing stump (appropriately TTP) Skin: mobility and turgor normal Neurologic: Grossly normal  Consultants:    Procedures:    Data Reviewed: I have personally reviewed following labs and imaging studies Results for orders placed or performed during the hospital encounter of 09/25/20 (from the past 24 hour(s))  Glucose, capillary     Status: Abnormal   Collection Time: 09/27/20  4:47 PM  Result Value Ref Range   Glucose-Capillary 325 (H) 70 - 99 mg/dL  Glucose, capillary     Status: Abnormal   Collection Time: 09/27/20  8:52 PM  Result Value Ref Range   Glucose-Capillary 318 (H) 70 - 99 mg/dL  Procalcitonin     Status: None   Collection Time: 09/28/20  3:35 AM  Result Value Ref Range   Procalcitonin 54.50 ng/mL  CBC with Differential/Platelet     Status: Abnormal   Collection Time: 09/28/20  3:35 AM  Result Value Ref Range   WBC 13.5 (H) 4.0 - 10.5 K/uL   RBC 4.06 3.87 - 5.11 MIL/uL   Hemoglobin 12.1 12.0 - 15.0 g/dL   HCT 37.7 36.0 - 46.0 %   MCV 92.9 80.0 - 100.0 fL   MCH 29.8 26.0 - 34.0 pg   MCHC 32.1 30.0 - 36.0 g/dL   RDW 15.5 11.5 - 15.5 %   Platelets 224  150 - 400 K/uL   nRBC 0.0 0.0 - 0.2 %   Neutrophils Relative % 82 %   Neutro Abs 11.2 (H) 1.7 - 7.7 K/uL   Lymphocytes Relative 7 %   Lymphs Abs 0.9 0.7 - 4.0 K/uL   Monocytes Relative 9 %   Monocytes Absolute 1.2 (H) 0.1 - 1.0 K/uL   Eosinophils Relative 0 %   Eosinophils Absolute 0.0 0.0 - 0.5 K/uL   Basophils Relative 0 %   Basophils Absolute 0.0 0.0 - 0.1 K/uL   Immature Granulocytes 2 %   Abs Immature Granulocytes 0.20 (H) 0.00 - 0.07 K/uL  Comprehensive metabolic panel     Status: Abnormal   Collection Time: 09/28/20  3:35 AM  Result Value Ref Range   Sodium 140 135 - 145 mmol/L   Potassium 3.9 3.5 - 5.1 mmol/L   Chloride 103 98 - 111 mmol/L   CO2 26 22 - 32 mmol/L   Glucose, Bld 253 (H) 70 - 99 mg/dL   BUN 37 (H) 8 - 23 mg/dL  Creatinine, Ser 1.07 (H) 0.44 - 1.00 mg/dL   Calcium 9.0 8.9 - 10.3 mg/dL   Total Protein 6.6 6.5 - 8.1 g/dL   Albumin 3.0 (L) 3.5 - 5.0 g/dL   AST 16 15 - 41 U/L   ALT 19 0 - 44 U/L   Alkaline Phosphatase 120 38 - 126 U/L   Total Bilirubin 0.8 0.3 - 1.2 mg/dL   GFR, Estimated 58 (L) >60 mL/min   Anion gap 11 5 - 15  Magnesium     Status: None   Collection Time: 09/28/20  3:35 AM  Result Value Ref Range   Magnesium 2.1 1.7 - 2.4 mg/dL  Glucose, capillary     Status: Abnormal   Collection Time: 09/28/20  8:37 AM  Result Value Ref Range   Glucose-Capillary 226 (H) 70 - 99 mg/dL  Glucose, capillary     Status: Abnormal   Collection Time: 09/28/20 12:07 PM  Result Value Ref Range   Glucose-Capillary 308 (H) 70 - 99 mg/dL    Recent Results (from the past 240 hour(s))  Resp Panel by RT-PCR (Flu A&B, Covid) Nasopharyngeal Swab     Status: Abnormal   Collection Time: 09/25/20  7:46 PM   Specimen: Nasopharyngeal Swab; Nasopharyngeal(NP) swabs in vial transport medium  Result Value Ref Range Status   SARS Coronavirus 2 by RT PCR POSITIVE (A) NEGATIVE Final    Comment: RESULT CALLED TO, READ BACK BY AND VERIFIED WITH: J LOUDERMILK AT 2050 ON  09/25/2020 BY MOSLEY,J (NOTE) SARS-CoV-2 target nucleic acids are DETECTED.  The SARS-CoV-2 RNA is generally detectable in upper respiratory specimens during the acute phase of infection. Positive results are indicative of the presence of the identified virus, but do not rule out bacterial infection or co-infection with other pathogens not detected by the test. Clinical correlation with patient history and other diagnostic information is necessary to determine patient infection status. The expected result is Negative.  Fact Sheet for Patients: EntrepreneurPulse.com.au  Fact Sheet for Healthcare Providers: IncredibleEmployment.be  This test is not yet approved or cleared by the [place] FDA and  has been authorized for detection and/or diagnosis of SARS-CoV-2 by FDA under an Emergency Use Authorization (EUA).  This EUA will remain in effect (meaning this  test can be used) for the duration of  the COVID-19 declaration under Section 564(b)(1) of the Act, 21 U.S.C. section 360bbb-3(b)(1), unless the authorization is terminated or revoked sooner.     Influenza A by PCR NEGATIVE NEGATIVE Final   Influenza B by PCR NEGATIVE NEGATIVE Final    Comment: (NOTE) The Xpert Xpress SARS-CoV-2/FLU/RSV plus assay is intended as an aid in the diagnosis of influenza from Nasopharyngeal swab specimens and should not be used as a sole basis for treatment. Nasal washings and aspirates are unacceptable for Xpert Xpress SARS-CoV-2/FLU/RSV testing.  Fact Sheet for Patients: EntrepreneurPulse.com.au  Fact Sheet for Healthcare Providers: IncredibleEmployment.be  This test is not yet approved or cleared by the [place] FDA and has been authorized for detection and/or diagnosis of SARS-CoV-2 by FDA under an Emergency Use Authorization (EUA). This EUA will remain in effect (meaning this test can be used) for the duration  of the COVID-19 declaration under Section 564(b)(1) of the Act, 21 U.S.C. section 360bbb-3(b)(1), unless the authorization is terminated or revoked.  Performed at [facility], [place] [address], [place], [place] [address]   Culture, blood (Routine X 2) w Reflex to ID Panel     Status: Abnormal (Preliminary  result)   Collection Time: 09/26/20  1:40 AM   Specimen: BLOOD  Result Value Ref Range Status   Specimen Description   Final    BLOOD BLOOD LEFT HAND Performed at [facility] [address] [place], [place] [address]    Special Requests   Final    BOTTLES DRAWN AEROBIC ONLY Blood Culture adequate volume Performed at [facility] [address], [place], [place] [address]    Culture  Setup Time   Final    GRAM NEGATIVE RODS AEROBIC BOTTLE ONLY CRITICAL RESULT CALLED TO, READ BACK BY AND VERIFIED WITH: Colin Rhein PHARMD [id number] 09/27/20 A BROWNING    Culture (A)  Final    ESCHERICHIA COLI SUSCEPTIBILITIES TO FOLLOW Performed at [facility], [address] [place], [place] [address]    Report Status PENDING  Incomplete  Blood Culture ID Panel (Reflexed)     Status: Abnormal   Collection Time: 09/26/20  1:40 AM  Result Value Ref Range Status   Enterococcus faecalis NOT DETECTED NOT DETECTED Final   Enterococcus Faecium NOT DETECTED NOT DETECTED Final   Listeria monocytogenes NOT DETECTED NOT DETECTED Final   Staphylococcus species NOT DETECTED NOT DETECTED Final   Staphylococcus aureus (BCID) NOT DETECTED NOT DETECTED Final   Staphylococcus epidermidis NOT DETECTED NOT DETECTED Final   Staphylococcus lugdunensis NOT DETECTED NOT DETECTED Final   Streptococcus species NOT DETECTED NOT DETECTED Final   Streptococcus agalactiae NOT DETECTED NOT DETECTED Final   Streptococcus pneumoniae NOT DETECTED NOT DETECTED Final   Streptococcus pyogenes NOT DETECTED NOT DETECTED Final   A.calcoaceticus-baumannii NOT DETECTED NOT  DETECTED Final   Bacteroides fragilis NOT DETECTED NOT DETECTED Final   Enterobacterales DETECTED (A) NOT DETECTED Final    Comment: Enterobacterales represent a large order of gram negative bacteria, not a single organism. CRITICAL RESULT CALLED TO, READ BACK BY AND VERIFIED WITH: Colin Rhein PHARMD [id number] 09/27/20 A BROWNING    Enterobacter cloacae complex NOT DETECTED NOT DETECTED Final   Escherichia coli DETECTED (A) NOT DETECTED Final    Comment: CRITICAL RESULT CALLED TO, READ BACK BY AND VERIFIED WITH: Colin Rhein PHARMD [id number] 09/27/20 A BROWNING    Klebsiella aerogenes NOT DETECTED NOT DETECTED Final   Klebsiella oxytoca NOT DETECTED NOT DETECTED Final   Klebsiella pneumoniae NOT DETECTED NOT DETECTED Final   Proteus species NOT DETECTED NOT DETECTED Final   Salmonella species NOT DETECTED NOT DETECTED Final   Serratia marcescens NOT DETECTED NOT DETECTED Final   Haemophilus influenzae NOT DETECTED NOT DETECTED Final   Neisseria meningitidis NOT DETECTED NOT DETECTED Final   Pseudomonas aeruginosa NOT DETECTED NOT DETECTED Final   Stenotrophomonas maltophilia NOT DETECTED NOT DETECTED Final   Candida albicans NOT DETECTED NOT DETECTED Final   Candida auris NOT DETECTED NOT DETECTED Final   Candida glabrata NOT DETECTED NOT DETECTED Final   Candida krusei NOT DETECTED NOT DETECTED Final   Candida parapsilosis NOT DETECTED NOT DETECTED Final   Candida tropicalis NOT DETECTED NOT DETECTED Final   Cryptococcus neoformans/gattii NOT DETECTED NOT DETECTED Final   CTX-M ESBL DETECTED (A) NOT DETECTED Final    Comment: CRITICAL RESULT CALLED TO, READ BACK BY AND VERIFIED WITH: Colin Rhein PHARMD 0865 09/27/20 A BROWNING (NOTE) Extended spectrum beta-lactamase detected. Recommend a carbapenem as initial therapy.      Carbapenem resistance IMP NOT DETECTED NOT DETECTED Final   Carbapenem resistance KPC NOT DETECTED NOT DETECTED Final   Carbapenem resistance NDM  NOT DETECTED NOT DETECTED Final    Carbapenem resist OXA 48 LIKE NOT DETECTED NOT DETECTED Final   Carbapenem resistance VIM NOT DETECTED NOT DETECTED Final    Comment: Performed at [facility], [place] [address], [place], [place] [address]  MRSA PCR Screening     Status: None   Collection Time: 09/26/20  3:30 AM  Result Value Ref Range Status   MRSA by PCR NEGATIVE NEGATIVE Final    Comment:        The GeneXpert MRSA Assay (FDA approved for NASAL specimens only), is one component of a comprehensive MRSA colonization surveillance program. It is not intended to diagnose MRSA infection nor to guide or monitor treatment for MRSA infections. Performed at [facility], [place] [address] [place], [place] [address]      Radiology Studies: No results found. DG ABD ACUTE 2+V W 1V CHEST  Final Result      Scheduled Meds:  feeding supplement  1 Container Oral BID BM   gabapentin  300 mg Oral TID   heparin  5,000 Units Subcutaneous Q8H   insulin aspart  0-5 Units Subcutaneous QHS   insulin aspart  0-9 Units Subcutaneous TID WC   insulin glargine  15 Units Subcutaneous Once   [START ON 09/29/2020] insulin glargine  26 Units Subcutaneous Daily   methylPREDNISolone (SOLU-MEDROL) injection  60 mg Intravenous BID   PRN Meds: acetaminophen, acetaminophen **OR** [DISCONTINUED] acetaminophen, calcium carbonate (dosed in mg elemental calcium), camphor-menthol **AND** hydrOXYzine, docusate sodium, ondansetron **OR** ondansetron (ZOFRAN) IV, sorbitol, zolpidem Continuous Infusions:  meropenem (MERREM) IV Stopped (09/28/20 1328)   remdesivir 100 mg in NS 100 mL Stopped (09/28/20 0928)     LOS: 3 days  Time spent: Greater than 50% of the 35 minute visit was spent in counseling/coordination of care for the patient as laid out in the A&P.   Dwyane Dee, MD [facility] 09/28/2020, 3:35 PM

## 2020-09-28 NOTE — Assessment & Plan Note (Addendum)
-   see sepsis - unclear source; no prior known hx ESBL infections; recent LBKA may somehow be involved - continue meropenem - discussed with ID. Plan will be for 7 day course of meropenem. End date would be 10/04/20.

## 2020-09-28 NOTE — TOC Progression Note (Signed)
Transition of Care [facility]) - Progression Note    Patient Details  Name: Shirley Chen MRN: [id number] Date of Birth: 1956/01/08  Transition of Care [facility]) CM/SW Contact  Geni Bers, RN Phone Number: 09/28/2020, 3:54 PM  Clinical Narrative:     Spoke with pt concerning discharge plans. Pt plan to discharge to SNF.   Expected Discharge Plan: Skilled Nursing Facility Barriers to Discharge: No Barriers Identified  Expected Discharge Plan and Services Expected Discharge Plan: Skilled Nursing Facility     Post Acute Care Choice: Home Health Living arrangements for the past 2 months: Skilled Nursing Facility                                       Social Determinants of Health (SDOH) Interventions    Readmission Risk Interventions No flowsheet data found.

## 2020-09-28 NOTE — Evaluation (Signed)
Physical Therapy Evaluation Patient Details Name: Shirley Chen MRN: [id number] DOB: 08-Mar-1956 Today's Date: 09/28/2020   History of Present Illness  65 y.o. female with admitted with sepsis. found to be covid positive also.  PMH: hypertension, diabetes, peripheral vascular disease, tobacco abuse, recent necrotizing fasciitis with left below-knee amputation in May, history of femoral-tibial bypass surgery in the past who was discharged to the skilled facility.  Clinical Impression  Pt admitted with above diagnosis.  Pt is very motivated, has made excellent progress during her recent SNF stay. Pt reports she is supposed to get her prosthesis soon, rehab with it, then she will be ready to go home.  Recommend continued rehab at SNF level post acute. Pt is interested in going to a different SNF   Pt currently with functional limitations due to the deficits listed below (see PT Problem List). Pt will benefit from skilled PT to increase their independence and safety with mobility to allow discharge to the venue listed below.       Follow Up Recommendations SNF    Equipment Recommendations  Other (comment) (defer to SNF)    Recommendations for Other Services       Precautions / Restrictions Precautions Precautions: Fall Restrictions Weight Bearing Restrictions: No      Mobility  Bed Mobility Overal bed mobility: Needs Assistance Bed Mobility: Supine to Sit     Supine to sit: Min guard     General bed mobility comments: for safety and lines    Transfers Overall transfer level: Needs assistance Equipment used: None Transfers: Sit to/from [employer] Sit to Stand: Min guard Stand pivot transfers: Min guard;Min assist       General transfer comment: cues for hand placement, pt able to stand and then perform 3/4-stand pivot bed to chair  Ambulation/Gait             General Gait Details: deferred amb attempts as pt needed to transfer to [facility]  Stairs             Wheelchair Mobility    Modified Rankin (Stroke Patients Only)       Balance Overall balance assessment: Needs assistance Sitting-balance support: Feet supported;No upper extremity supported Sitting balance-Leahy Scale: Good       Standing balance-Leahy Scale: Poor Standing balance comment: reliant on UE support                             Pertinent Vitals/Pain Pain Assessment: No/denies pain    Home Living Family/patient expects to be discharged to:: Skilled nursing facility                      Prior Function           Comments: pt reports she was doing transfers, amb up to 2' with RW,  working on LE strengthening and w/c mobility. pt reports she is supposed to be getting her prosthesis very soon     Hand Dominance        Extremity/Trunk Assessment   Upper Extremity Assessment Upper Extremity Assessment: Overall WFL for tasks assessed;Defer to OT evaluation    Lower Extremity Assessment Lower Extremity Assessment: LLE deficits/detail;RLE deficits/detail RLE Deficits / Details: grossly WFL LLE Deficits / Details: L BKA, ROM grossly WFL       Communication      Cognition Arousal/Alertness: Awake/alert Behavior During Therapy: WFL for tasks assessed/performed Overall Cognitive Status: Within Functional Limits for  tasks assessed                                        General Comments      Exercises     Assessment/Plan    PT Assessment Patient needs continued PT services  PT Problem List Decreased strength;Decreased mobility;Decreased balance;Decreased knowledge of use of DME;Decreased activity tolerance       PT Treatment Interventions DME instruction;Therapeutic exercise;Gait training;Functional mobility training;Therapeutic activities;Patient/family education    PT Goals (Current goals can be found in the Care Plan section)  Acute Rehab PT Goals Patient Stated Goal: home after rehab PT  Goal Formulation: With patient Time For Goal Achievement: 10/12/20 Potential to Achieve Goals: Good    Frequency     Barriers to discharge        Co-evaluation               AM-PAC PT "6 Clicks" Mobility  Outcome Measure Help needed turning from your back to your side while in a flat bed without using bedrails?: A Little Help needed moving from lying on your back to sitting on the side of a flat bed without using bedrails?: A Little Help needed moving to and from a bed to a chair (including a wheelchair)?: A Little Help needed standing up from a chair using your arms (e.g., wheelchair or bedside chair)?: A Little Help needed to walk in hospital room?: A Lot Help needed climbing 3-5 steps with a railing? : Total 6 Click Score: 15    End of Session   Activity Tolerance: Patient tolerated treatment well Patient left: with call bell/phone within reach;Other (comment) (on BSC, NT aware) Nurse Communication: Mobility status PT Visit Diagnosis: Other abnormalities of gait and mobility (R26.89)    Time: 5643-3295 PT Time Calculation (min) (ACUTE ONLY): 18 min   Charges:   PT Evaluation $PT Eval Low Complexity: 1 Low          Latissa Frick, PT  Acute Rehab Dept ([facility]) [phone] Pager [phone]  09/28/2020   [facility] 09/28/2020, 4:46 PM

## 2020-09-29 LAB — COMPREHENSIVE METABOLIC PANEL
ALT: 29 U/L (ref 0–44)
AST: 26 U/L (ref 15–41)
Albumin: 3 g/dL — ABNORMAL LOW (ref 3.5–5.0)
Alkaline Phosphatase: 129 U/L — ABNORMAL HIGH (ref 38–126)
Anion gap: 9 (ref 5–15)
BUN: 37 mg/dL — ABNORMAL HIGH (ref 8–23)
CO2: 27 mmol/L (ref 22–32)
Calcium: 8.9 mg/dL (ref 8.9–10.3)
Chloride: 100 mmol/L (ref 98–111)
Creatinine, Ser: 0.99 mg/dL (ref 0.44–1.00)
GFR, Estimated: >60 mL/min (ref >60)
Glucose, Bld: 292 mg/dL — ABNORMAL HIGH (ref 70–99)
Potassium: 3.9 mmol/L (ref 3.5–5.1)
Sodium: 136 mmol/L (ref 135–145)
Total Bilirubin: 0.9 mg/dL (ref 0.3–1.2)
Total Protein: 6.9 g/dL (ref 6.5–8.1)

## 2020-09-29 LAB — CBC WITH DIFFERENTIAL/PLATELET
Abs Immature Granulocytes: 0.29 10*3/uL — ABNORMAL HIGH (ref 0.00–0.07)
Basophils Absolute: 0 10*3/uL (ref 0.0–0.1)
Basophils Relative: 0 %
Eosinophils Absolute: 0 10*3/uL (ref 0.0–0.5)
Eosinophils Relative: 0 %
HCT: 39.9 % (ref 36.0–46.0)
Hemoglobin: 12.9 g/dL (ref 12.0–15.0)
Immature Granulocytes: 4 %
Lymphocytes Relative: 9 %
Lymphs Abs: 0.7 10*3/uL (ref 0.7–4.0)
MCH: 29.7 pg (ref 26.0–34.0)
MCHC: 32.3 g/dL (ref 30.0–36.0)
MCV: 91.7 fL (ref 80.0–100.0)
Monocytes Absolute: 0.3 10*3/uL (ref 0.1–1.0)
Monocytes Relative: 3 %
Neutro Abs: 6.8 10*3/uL (ref 1.7–7.7)
Neutrophils Relative %: 84 %
Platelets: 211 10*3/uL (ref 150–400)
RBC: 4.35 MIL/uL (ref 3.87–5.11)
RDW: 15.1 % (ref 11.5–15.5)
WBC: 8.2 10*3/uL (ref 4.0–10.5)
nRBC: 0 % (ref 0.0–0.2)

## 2020-09-29 LAB — CULTURE, BLOOD (ROUTINE X 2): Special Requests: ADEQUATE

## 2020-09-29 LAB — GLUCOSE, CAPILLARY
Glucose-Capillary: 306 mg/dL — ABNORMAL HIGH (ref 70–99)
Glucose-Capillary: 381 mg/dL — ABNORMAL HIGH (ref 70–99)
Glucose-Capillary: 305 mg/dL — ABNORMAL HIGH (ref 70–99)
Glucose-Capillary: 322 mg/dL — ABNORMAL HIGH (ref 70–99)

## 2020-09-29 LAB — PROCALCITONIN: Procalcitonin: 27.04 ng/mL

## 2020-09-29 LAB — MAGNESIUM: Magnesium: 2 mg/dL (ref 1.7–2.4)

## 2020-09-29 MED ORDER — LABETALOL HCL 5 MG/ML IV SOLN
10.0000 mg | INTRAVENOUS | Status: DC | PRN
  Filled 2020-09-29 (×2): qty 4

## 2020-09-29 MED ORDER — AMLODIPINE BESYLATE 10 MG PO TABS
10.0000 mg | ORAL_TABLET | Freq: Every day | ORAL | Status: DC
  Administered 2020-09-29 – 2020-10-04 (×6): 10 mg via ORAL
  Filled 2020-09-29 (×6): qty 1

## 2020-09-29 MED ORDER — HYDRALAZINE HCL 25 MG PO TABS
25.0000 mg | ORAL_TABLET | ORAL | Status: DC | PRN
  Filled 2020-09-29 (×2): qty 1

## 2020-09-29 MED ORDER — HYDROMORPHONE HCL 1 MG/ML IJ SOLN
0.5000 mg | Freq: Once | INTRAMUSCULAR | Status: AC
  Administered 2020-09-29: 0.5 mg via INTRAVENOUS
  Filled 2020-09-29: qty 0.5

## 2020-09-29 MED ORDER — OXYCODONE HCL 5 MG PO TABS
5.0000 mg | ORAL_TABLET | ORAL | Status: DC | PRN
  Administered 2020-09-29 – 2020-10-04 (×17): 5 mg via ORAL
  Filled 2020-09-29 (×16): qty 1

## 2020-09-29 NOTE — [place] FL2 (Signed)
[place] MEDICAID FL2 LEVEL OF CARE SCREENING TOOL     IDENTIFICATION  Patient Name: Shirley Chen Birthdate: 1956-03-08 Sex: female Admission Date (Current Location): 09/25/2020  [facility] and [place] Number:  Producer, television/film/video and Address:  [facility],  [address] [place], [place] [address]      Provider Number: [id number]  Attending Physician Name and Address:  Lewie Chamber, MD  Relative Name and Phone Number:  Zachary George" (Relative)   [phone]    Current Level of Care: Hospital Recommended Level of Care: Skilled Nursing Facility Prior Approval Number:    Date Approved/Denied:   PASRR Number: [id number]  Discharge Plan: SNF    Current Diagnoses: Patient Active Problem List   Diagnosis Date Noted   Bacteremia due to Gram-negative bacteria 09/28/2020   Metabolic acidosis 09/26/2020   COVID-19 virus infection 09/26/2020   Leucocytosis 09/25/2020   Severe sepsis (HCC) 09/25/2020   Necrotizing fasciitis (HCC)    Acute respiratory failure with hypoxia (HCC) 08/02/2020   Acute renal failure superimposed on stage 3b chronic kidney disease (HCC) 08/02/2020   Status post amputation of great toe, right (HCC) 03/11/2019   Hyperlipidemia 06/16/2017   Mononeuropathy due to underlying disease 02/12/2017   Diabetic foot infection (HCC)    Type 2 diabetes mellitus (HCC) 11/27/2014   PAD (peripheral artery disease) (HCC) 11/26/2014   HTN (hypertension) 11/26/2014   Diabetes mellitus with peripheral vascular disease (HCC) 11/26/2014   Atherosclerosis of native arteries of the extremities with ulceration (HCC) 10/27/2014   Tobacco abuse 10/20/2014    Orientation RESPIRATION BLADDER Height & Weight     Self, Time, Situation, Place  Normal External catheter Weight: 89.4 kg Height:  5\' 4"  (162.6 cm)  BEHAVIORAL SYMPTOMS/MOOD NEUROLOGICAL BOWEL NUTRITION STATUS   (none)  (none) Continent Diet (see d/c summary)  AMBULATORY STATUS COMMUNICATION OF  NEEDS Skin   Extensive Assist Verbally Surgical wounds (closed incision L foot, L leg)                       Personal Care Assistance Level of Assistance  Bathing, Feeding, Dressing Bathing Assistance: Limited assistance Feeding assistance: Independent Dressing Assistance: Limited assistance     Functional Limitations Info  Sight, Hearing, Speech Sight Info: Adequate Hearing Info: Adequate Speech Info: Adequate    SPECIAL CARE FACTORS FREQUENCY  PT (By licensed PT), OT (By licensed OT)     PT Frequency: 5X/W OT Frequency: 5X/W            Contractures      Additional Factors Info  Code Status, Allergies Code Status Info: full Allergies Info: losartan, morphine           Current Medications (09/29/2020):  This is the current hospital active medication list Current Facility-Administered Medications  Medication Dose Route Frequency Provider Last Rate Last Admin   acetaminophen (TYLENOL) suppository 650 mg  650 mg Rectal Q4H PRN 10/01/2020, MD   650 mg at 09/25/20 2323   acetaminophen (TYLENOL) tablet 650 mg  650 mg Oral Q6H PRN 2324, MD   650 mg at 09/29/20 1006   amLODipine (NORVASC) tablet 10 mg  10 mg Oral Daily 10/01/20, MD   10 mg at 09/29/20 1529   calcium carbonate (dosed in mg elemental calcium) suspension 500 mg of elemental calcium  500 mg of elemental calcium Oral Q6H PRN 10/01/20, MD       camphor-menthol (SARNA) lotion 1 application  1  application Topical Q8H PRN Rometta Emery, MD       And   hydrOXYzine (ATARAX/VISTARIL) tablet 25 mg  25 mg Oral Q8H PRN Rometta Emery, MD       docusate sodium (ENEMEEZ) enema 283 mg  1 enema Rectal PRN Rometta Emery, MD       feeding supplement (BOOST / RESOURCE BREEZE) liquid 1 Container  1 Container Oral BID BM Lewie Chamber, MD   1 Container at 09/29/20 1005   gabapentin (NEURONTIN) capsule 300 mg  300 mg Oral TID Lewie Chamber, MD   300 mg at 09/29/20 1529   heparin  injection 5,000 Units  5,000 Units Subcutaneous Q8H Rometta Emery, MD   5,000 Units at 09/29/20 1307   hydrALAZINE (APRESOLINE) tablet 25 mg  25 mg Oral Q4H PRN Lewie Chamber, MD       insulin aspart (novoLOG) injection 0-5 Units  0-5 Units Subcutaneous QHS Luiz Iron, NP   5 Units at 09/28/20 2136   insulin aspart (novoLOG) injection 0-9 Units  0-9 Units Subcutaneous TID WC Luiz Iron, NP   9 Units at 09/29/20 1307   insulin glargine (LANTUS) injection 26 Units  26 Units Subcutaneous Daily Lewie Chamber, MD   26 Units at 09/29/20 1006   labetalol (NORMODYNE) injection 10 mg  10 mg Intravenous Q4H PRN Lewie Chamber, MD       meropenem (MERREM) 1 g in sodium chloride 0.9 % 100 mL IVPB  1 g Intravenous Q8H Phylliss Blakes, RPH 200 mL/hr at 09/29/20 1534 1 g at 09/29/20 1534   methylPREDNISolone sodium succinate (SOLU-MEDROL) 125 mg/2 mL injection 60 mg  60 mg Intravenous BID Lewie Chamber, MD   60 mg at 09/29/20 1308   ondansetron (ZOFRAN) tablet 4 mg  4 mg Oral Q6H PRN Rometta Emery, MD       Or   ondansetron (ZOFRAN) injection 4 mg  4 mg Intravenous Q6H PRN Rometta Emery, MD       oxyCODONE (Oxy IR/ROXICODONE) immediate release tablet 5 mg  5 mg Oral Q4H PRN Lewie Chamber, MD   5 mg at 09/29/20 1532   remdesivir 100 mg in sodium chloride 0.9 % 100 mL IVPB  100 mg Intravenous Daily Poindexter, Leann T, RPH 200 mL/hr at 09/29/20 1307 100 mg at 09/29/20 1307   sorbitol 70 % solution 30 mL  30 mL Oral PRN Rometta Emery, MD   30 mL at 09/28/20 0541   zolpidem (AMBIEN) tablet 5 mg  5 mg Oral QHS PRN Rometta Emery, MD   5 mg at 09/26/20 2130     Discharge Medications: Please see discharge summary for a list of discharge medications.  Relevant Imaging Results:  Relevant Lab Results:   Additional Information [id number]  Baldo Daub [place], [place]

## 2020-09-29 NOTE — Progress Notes (Signed)
Progress Note    Shirley Chen   URK:[id number]  DOB: 12-19-1955  DOA: 09/25/2020     4  PCP: Marrian Salvage, FNP  CC: weakness, N/V  Hospital Course: Shirley Chen is a 65 y.o. female with medical history significant of hypertension, diabetes, peripheral vascular disease, tobacco abuse, recent necrotizing fasciitis with left below-knee amputation in May, history of femoral-tibial bypass surgery in the past who was discharged to the skilled facility where she resides.   She presented to the hospital with nausea/vomiting. On work-up she was found to be febrile, 105 degrees, lethargic.  There was concern for underlying sepsis and she was started on vancomycin and cefepime for empiric coverage.  COVID-19 testing also came back positive and she had been placed on oxygen for low normal saturations. Further work-up revealed AKI and she was started on fluids as well.  Interval History:  No events overnight.  This morning she was in a significant amount of pain all over.  Seemed to be component of phantom pain as well as generalized pains.  Was ordered some pain medication for relief.  ROS: Constitutional: positive for chills, fatigue, fevers, and malaise, negative for night sweats, Respiratory: negative for cough and sputum, Cardiovascular: negative for chest pain, and Gastrointestinal: negative for abdominal pain  Assessment & Plan: * Hyperkalemia-resolved as of 09/26/2020 - Treated on admission - Trend BMP  Bacteremia due to Gram-negative bacteria - see sepsis - unclear source; no prior known hx ESBL infections; recent LBKA may somehow be involved - continue meropenem - will discuss treatment options with ID after sensitivities result   COVID-19 virus infection - continue remdesivir and steroids for now - unclear if was truly hypoxic on admission; now weaned to RA on 6/22 - trend inflammatory markers   Severe sepsis (HCC) - febrile, tachypnea, leukocytosis, initially on  O2 on admission - now weaned off O2 - possible source considered covid vs other u/k source currently - continue empiric vanc and cefepime; low threshold to add Flagyl if further decline or not much improvement - Trend procalcitonin (140 >>133>>54) - lactic normal -bacterial source presented itself on 6/23, 1/2 blood cx positive for E Coli ESBL. Abx modified from vanc/cefepime to monotherapy meropenem - repeat blood culture on 6/24  Acute renal failure superimposed on stage 3b chronic kidney disease ([place]) - patient has history of CKD3b. Baseline creat ~ 1.4, eGFR 42 - patient presents with increase in creat >0.3 mg/dL above baseline, creat increase >1.5x baseline presumed to have occurred within past 7 days PTA - creat 2.56 on admission; etiology presumed to be decreased oral intake from acute illness -Creatinine has responded well to fluids and her appetite has improved - Discontinue fluids and monitor renal function off  Metabolic acidosis - presumed from renal failure - s/p bicarb drip; responded well - transitioned IVF to LR; d/c IVF on 6/24 - trend BMP  Diabetic foot infection ([place]) - s/p L BKA on 08/08/20. Stump looks okay  Diabetes mellitus with peripheral vascular disease ([place]) - last A1c 8% on 06/21/20 - continue SSI and CBG monitoring - watch glucose while on steroids; will adjust regimen as needed  HTN (hypertension) - Resume amlodipine - Continue holding Coreg and hydralazine  PAD (peripheral artery disease) (HCC) - s/p L BKA on 08/08/20  Tobacco abuse - nicotine patch if needed   Old records reviewed in assessment of this patient  Antimicrobials: Cefepime 09/26/2020 >> 09/27/2020 Vancomycin 09/26/2020 >> 09/27/2020 Meropenem 09/27/2020 >> current  DVT prophylaxis: heparin injection  5,000 Units Start: 09/25/20 2315   Code Status:   Code Status: Full Code Family Communication:   Disposition Plan: Status is: Inpatient  Remains inpatient appropriate  because:Unsafe d/c plan, IV treatments appropriate due to intensity of illness or inability to take PO, and Inpatient level of care appropriate due to severity of illness  Dispo: The patient is from: SNF              Anticipated d/c is to: SNF              Patient currently is not medically stable to d/c.   Difficult to place patient No  Risk of unplanned readmission score: Unplanned Admission- Pilot do not use: 20.92   Objective: Blood pressure (!) 142/69, pulse 63, temperature 98.2 F (36.8 C), temperature source Oral, resp. rate 13, height 5' 4" (1.626 m), weight 89.4 kg, SpO2 96 %.  Examination: General appearance:  Tearful and upset from diffuse pain throughout including phantom pain Head: Normocephalic, without obvious abnormality, atraumatic Eyes:  EOMI Lungs: clear to auscultation bilaterally Heart: regular rate and rhythm and S1, S2 normal Abdomen: normal findings: bowel sounds normal and soft, non-tender Extremities:  L BKA noted with well healing stump (appropriately TTP) Skin: mobility and turgor normal Neurologic: Grossly normal  Consultants:    Procedures:    Data Reviewed: I have personally reviewed following labs and imaging studies Results for orders placed or performed during the hospital encounter of 09/25/20 (from the past 24 hour(s))  Glucose, capillary     Status: Abnormal   Collection Time: 09/28/20  4:55 PM  Result Value Ref Range   Glucose-Capillary 393 (H) 70 - 99 mg/dL  Glucose, capillary     Status: Abnormal   Collection Time: 09/28/20  8:31 PM  Result Value Ref Range   Glucose-Capillary 361 (H) 70 - 99 mg/dL  CBC with Differential/Platelet     Status: Abnormal   Collection Time: 09/29/20  4:34 AM  Result Value Ref Range   WBC 8.2 4.0 - 10.5 K/uL   RBC 4.35 3.87 - 5.11 MIL/uL   Hemoglobin 12.9 12.0 - 15.0 g/dL   HCT 39.9 36.0 - 46.0 %   MCV 91.7 80.0 - 100.0 fL   MCH 29.7 26.0 - 34.0 pg   MCHC 32.3 30.0 - 36.0 g/dL   RDW 15.1 11.5 - 15.5 %    Platelets 211 150 - 400 K/uL   nRBC 0.0 0.0 - 0.2 %   Neutrophils Relative % 84 %   Neutro Abs 6.8 1.7 - 7.7 K/uL   Lymphocytes Relative 9 %   Lymphs Abs 0.7 0.7 - 4.0 K/uL   Monocytes Relative 3 %   Monocytes Absolute 0.3 0.1 - 1.0 K/uL   Eosinophils Relative 0 %   Eosinophils Absolute 0.0 0.0 - 0.5 K/uL   Basophils Relative 0 %   Basophils Absolute 0.0 0.0 - 0.1 K/uL   Immature Granulocytes 4 %   Abs Immature Granulocytes 0.29 (H) 0.00 - 0.07 K/uL  Comprehensive metabolic panel     Status: Abnormal   Collection Time: 09/29/20  4:34 AM  Result Value Ref Range   Sodium 136 135 - 145 mmol/L   Potassium 3.9 3.5 - 5.1 mmol/L   Chloride 100 98 - 111 mmol/L   CO2 27 22 - 32 mmol/L   Glucose, Bld 292 (H) 70 - 99 mg/dL   BUN 37 (H) 8 - 23 mg/dL   Creatinine, Ser 0.99 0.44 - 1.00 mg/dL  Calcium 8.9 8.9 - 10.3 mg/dL   Total Protein 6.9 6.5 - 8.1 g/dL   Albumin 3.0 (L) 3.5 - 5.0 g/dL   AST 26 15 - 41 U/L   ALT 29 0 - 44 U/L   Alkaline Phosphatase 129 (H) 38 - 126 U/L   Total Bilirubin 0.9 0.3 - 1.2 mg/dL   GFR, Estimated >60 >60 mL/min   Anion gap 9 5 - 15  Magnesium     Status: None   Collection Time: 09/29/20  4:34 AM  Result Value Ref Range   Magnesium 2.0 1.7 - 2.4 mg/dL  Procalcitonin - Baseline     Status: None   Collection Time: 09/29/20  4:34 AM  Result Value Ref Range   Procalcitonin 27.04 ng/mL  Glucose, capillary     Status: Abnormal   Collection Time: 09/29/20  7:21 AM  Result Value Ref Range   Glucose-Capillary 306 (H) 70 - 99 mg/dL  Glucose, capillary     Status: Abnormal   Collection Time: 09/29/20 11:20 AM  Result Value Ref Range   Glucose-Capillary 381 (H) 70 - 99 mg/dL    Recent Results (from the past 240 hour(s))  Resp Panel by RT-PCR (Flu A&B, Covid) Nasopharyngeal Swab     Status: Abnormal   Collection Time: 09/25/20  7:46 PM   Specimen: Nasopharyngeal Swab; Nasopharyngeal(NP) swabs in vial transport medium  Result Value Ref Range Status   SARS  Coronavirus 2 by RT PCR POSITIVE (A) NEGATIVE Final    Comment: RESULT CALLED TO, READ BACK BY AND VERIFIED WITH: J LOUDERMILK AT 2050 ON 09/25/2020 BY MOSLEY,J (NOTE) SARS-CoV-2 target nucleic acids are DETECTED.  The SARS-CoV-2 RNA is generally detectable in upper respiratory specimens during the acute phase of infection. Positive results are indicative of the presence of the identified virus, but do not rule out bacterial infection or co-infection with other pathogens not detected by the test. Clinical correlation with patient history and other diagnostic information is necessary to determine patient infection status. The expected result is Negative.  Fact Sheet for Patients: EntrepreneurPulse.com.au  Fact Sheet for Healthcare Providers: IncredibleEmployment.be  This test is not yet approved or cleared by the [place] FDA and  has been authorized for detection and/or diagnosis of SARS-CoV-2 by FDA under an Emergency Use Authorization (EUA).  This EUA will remain in effect (meaning this  test can be used) for the duration of  the COVID-19 declaration under Section 564(b)(1) of the Act, 21 U.S.C. section 360bbb-3(b)(1), unless the authorization is terminated or revoked sooner.     Influenza A by PCR NEGATIVE NEGATIVE Final   Influenza B by PCR NEGATIVE NEGATIVE Final    Comment: (NOTE) The Xpert Xpress SARS-CoV-2/FLU/RSV plus assay is intended as an aid in the diagnosis of influenza from Nasopharyngeal swab specimens and should not be used as a sole basis for treatment. Nasal washings and aspirates are unacceptable for Xpert Xpress SARS-CoV-2/FLU/RSV testing.  Fact Sheet for Patients: EntrepreneurPulse.com.au  Fact Sheet for Healthcare Providers: IncredibleEmployment.be  This test is not yet approved or cleared by the [place] FDA and has been authorized for detection and/or diagnosis of  SARS-CoV-2 by FDA under an Emergency Use Authorization (EUA). This EUA will remain in effect (meaning this test can be used) for the duration of the COVID-19 declaration under Section 564(b)(1) of the Act, 21 U.S.C. section 360bbb-3(b)(1), unless the authorization is terminated or revoked.  Performed at [facility], [place] [address] [place], [place] [address]  Culture, blood (Routine X 2) w Reflex to ID Panel     Status: Abnormal   Collection Time: 09/26/20  1:40 AM   Specimen: BLOOD  Result Value Ref Range Status   Specimen Description   Final    BLOOD BLOOD LEFT HAND Performed at [facility] [address] [place], [place] [address]    Special Requests   Final    BOTTLES DRAWN AEROBIC ONLY Blood Culture adequate volume Performed at [facility] [address] [place], [place] [address]    Culture  Setup Time   Final    GRAM NEGATIVE RODS AEROBIC BOTTLE ONLY CRITICAL RESULT CALLED TO, READ BACK BY AND VERIFIED WITH: Colin Rhein [facility] [id number] 09/27/20 A BROWNING Performed at [facility], [place] [address], [place], [place] [address]    Culture (A)  Final    ESCHERICHIA COLI Confirmed Extended Spectrum Beta-Lactamase Producer (ESBL).  In bloodstream infections from ESBL organisms, carbapenems are preferred over piperacillin/tazobactam. They are shown to have a lower risk of mortality.    Report Status 09/29/2020 FINAL  Final   Organism ID, Bacteria ESCHERICHIA COLI  Final      Susceptibility   Escherichia coli - MIC*    AMPICILLIN >=32 RESISTANT Resistant     CEFAZOLIN >=64 RESISTANT Resistant     CEFEPIME >=32 RESISTANT Resistant     CEFTAZIDIME RESISTANT Resistant     CEFTRIAXONE >=64 RESISTANT Resistant     CIPROFLOXACIN >=4 RESISTANT Resistant     GENTAMICIN <=1 SENSITIVE Sensitive     IMIPENEM <=0.25 SENSITIVE Sensitive     TRIMETH/SULFA <=20 SENSITIVE Sensitive     AMPICILLIN/SULBACTAM 16 INTERMEDIATE  Intermediate     PIP/TAZO <=4 SENSITIVE Sensitive     * ESCHERICHIA COLI  Blood Culture ID Panel (Reflexed)     Status: Abnormal   Collection Time: 09/26/20  1:40 AM  Result Value Ref Range Status   Enterococcus faecalis NOT DETECTED NOT DETECTED Final   Enterococcus Faecium NOT DETECTED NOT DETECTED Final   Listeria monocytogenes NOT DETECTED NOT DETECTED Final   Staphylococcus species NOT DETECTED NOT DETECTED Final   Staphylococcus aureus (BCID) NOT DETECTED NOT DETECTED Final   Staphylococcus epidermidis NOT DETECTED NOT DETECTED Final   Staphylococcus lugdunensis NOT DETECTED NOT DETECTED Final   Streptococcus species NOT DETECTED NOT DETECTED Final   Streptococcus agalactiae NOT DETECTED NOT DETECTED Final   Streptococcus pneumoniae NOT DETECTED NOT DETECTED Final   Streptococcus pyogenes NOT DETECTED NOT DETECTED Final   A.calcoaceticus-baumannii NOT DETECTED NOT DETECTED Final   Bacteroides fragilis NOT DETECTED NOT DETECTED Final   Enterobacterales DETECTED (A) NOT DETECTED Final    Comment: Enterobacterales represent a large order of gram negative bacteria, not a single organism. CRITICAL RESULT CALLED TO, READ BACK BY AND VERIFIED WITH: Colin Rhein PHARMD [id number] 09/27/20 A BROWNING    Enterobacter cloacae complex NOT DETECTED NOT DETECTED Final   Escherichia coli DETECTED (A) NOT DETECTED Final    Comment: CRITICAL RESULT CALLED TO, READ BACK BY AND VERIFIED WITH: Colin Rhein PHARMD [id number] 09/27/20 A BROWNING    Klebsiella aerogenes NOT DETECTED NOT DETECTED Final   Klebsiella oxytoca NOT DETECTED NOT DETECTED Final   Klebsiella pneumoniae NOT DETECTED NOT DETECTED Final   Proteus species NOT DETECTED NOT DETECTED Final   Salmonella species NOT DETECTED NOT DETECTED Final   Serratia marcescens NOT DETECTED NOT DETECTED Final   Haemophilus influenzae NOT DETECTED NOT DETECTED Final   Neisseria meningitidis  NOT DETECTED NOT DETECTED Final   Pseudomonas aeruginosa NOT DETECTED NOT  DETECTED Final   Stenotrophomonas maltophilia NOT DETECTED NOT DETECTED Final   Candida albicans NOT DETECTED NOT DETECTED Final   Candida auris NOT DETECTED NOT DETECTED Final   Candida glabrata NOT DETECTED NOT DETECTED Final   Candida krusei NOT DETECTED NOT DETECTED Final   Candida parapsilosis NOT DETECTED NOT DETECTED Final   Candida tropicalis NOT DETECTED NOT DETECTED Final   Cryptococcus neoformans/gattii NOT DETECTED NOT DETECTED Final   CTX-M ESBL DETECTED (A) NOT DETECTED Final    Comment: CRITICAL RESULT CALLED TO, READ BACK BY AND VERIFIED WITH: Colin Rhein PHARMD [id number] 09/27/20 A BROWNING (NOTE) Extended spectrum beta-lactamase detected. Recommend a carbapenem as initial therapy.      Carbapenem resistance IMP NOT DETECTED NOT DETECTED Final   Carbapenem resistance KPC NOT DETECTED NOT DETECTED Final   Carbapenem resistance NDM NOT DETECTED NOT DETECTED Final   Carbapenem resist OXA 48 LIKE NOT DETECTED NOT DETECTED Final   Carbapenem resistance VIM NOT DETECTED NOT DETECTED Final    Comment: Performed at [facility], [place] [address] [place], [place] [address]  MRSA PCR Screening     Status: None   Collection Time: 09/26/20  3:30 AM  Result Value Ref Range Status   MRSA by PCR NEGATIVE NEGATIVE Final    Comment:        The GeneXpert MRSA Assay (FDA approved for NASAL specimens only), is one component of a comprehensive MRSA colonization surveillance program. It is not intended to diagnose MRSA infection nor to guide or monitor treatment for MRSA infections. Performed at [facility], [place] [address] [place], [place] [address]   Culture, blood (routine x 2)     Status: None (Preliminary result)   Collection Time: 09/28/20 10:54 AM   Specimen: BLOOD  Result Value Ref Range Status   Specimen Description   Final    BLOOD RIGHT ANTECUBITAL Performed at [facility] [address], [place], [place] [address]    Special  Requests   Final    BOTTLES DRAWN AEROBIC ONLY Blood Culture adequate volume Performed at [facility] [address], [place], [place] [address]    Culture   Final    NO GROWTH < 24 HOURS Performed at [facility] [address] [place], [place] [address]    Report Status PENDING  Incomplete  Culture, blood (routine x 2)     Status: None (Preliminary result)   Collection Time: 09/28/20 10:54 AM   Specimen: BLOOD RIGHT HAND  Result Value Ref Range Status   Specimen Description   Final    BLOOD RIGHT HAND Performed at [facility] [address], [place], [place] [address]    Special Requests   Final    BOTTLES DRAWN AEROBIC ONLY Blood Culture results may not be optimal due to an inadequate volume of blood received in culture bottles Performed at [facility] [address] [place], [place] [address]    Culture   Final    NO GROWTH < 24 HOURS Performed at [facility] [address], [place], [place] [address]    Report Status PENDING  Incomplete     Radiology Studies: No results found. DG ABD ACUTE 2+V W 1V CHEST  Final Result      Scheduled Meds:  amLODipine  10 mg Oral Daily   feeding supplement  1  Container Oral BID BM   gabapentin  300 mg Oral TID   heparin  5,000 Units Subcutaneous Q8H   insulin aspart  0-5 Units Subcutaneous QHS   insulin aspart  0-9 Units Subcutaneous TID WC   insulin glargine  26 Units Subcutaneous Daily   methylPREDNISolone (SOLU-MEDROL) injection  60 mg Intravenous BID   PRN Meds: acetaminophen, acetaminophen **OR** [DISCONTINUED] acetaminophen, calcium carbonate (dosed in mg elemental calcium), camphor-menthol **AND** hydrOXYzine, docusate sodium, hydrALAZINE, labetalol, ondansetron **OR** ondansetron (ZOFRAN) IV, oxyCODONE, sorbitol, zolpidem Continuous Infusions:  meropenem (MERREM) IV 1 g (09/29/20 0529)   remdesivir 100 mg in NS 100 mL 100 mg (09/29/20 1307)      LOS: 4 days  Time spent: Greater than 50% of the 35 minute visit was spent in counseling/coordination of care for the patient as laid out in the A&P.   Dwyane Dee, MD [facility] 09/29/2020, 1:59 PM

## 2020-09-29 NOTE — TOC Progression Note (Addendum)
Transition of Care [facility]) - Progression Note    Patient Details  Name: Shirley Chen MRN: [id number] Date of Birth: 26-Jan-1956  Transition of Care [facility]) CM/SW Contact  Ida Rogue, [place] Phone Number: 09/29/2020, 3:55 PM  Clinical Narrative:   Spoke with patient who has been at [facility] since May for short term rehab, states she does not want to return there.  She goes on to explain she cannot return to her son in laws home until she gets her prothesis as she is unable to get around in the house without it, rendering her bed bound or chair bound.  I explained that her COVID positive status renders here ineligible for all but two facilities that I am aware of, and also pointed out that she is into co-pay days.  She voiced understanding. Spoke to son in law per her request to update him.  Bed search sent to [facility] and [place]. TOC will continue to follow during the course of hospitalization.     Expected Discharge Plan: Skilled Nursing Facility Barriers to Discharge: SNF Pending bed offer  Expected Discharge Plan and Services Expected Discharge Plan: Skilled Nursing Facility     Post Acute Care Choice: Home Health Living arrangements for the past 2 months: Skilled Nursing Facility                                       Social Determinants of Health (SDOH) Interventions    Readmission Risk Interventions No flowsheet data found.

## 2020-09-30 ENCOUNTER — Encounter (HOSPITAL_COMMUNITY): Payer: Self-pay | Admitting: Internal Medicine

## 2020-09-30 LAB — CBC WITH DIFFERENTIAL/PLATELET
Abs Immature Granulocytes: 0.42 10*3/uL — ABNORMAL HIGH (ref 0.00–0.07)
Basophils Absolute: 0.1 10*3/uL (ref 0.0–0.1)
Basophils Relative: 1 %
Eosinophils Absolute: 0 10*3/uL (ref 0.0–0.5)
Eosinophils Relative: 0 %
HCT: 39.9 % (ref 36.0–46.0)
Hemoglobin: 12.8 g/dL (ref 12.0–15.0)
Immature Granulocytes: 4 %
Lymphocytes Relative: 11 %
Lymphs Abs: 1.1 10*3/uL (ref 0.7–4.0)
MCH: 29.8 pg (ref 26.0–34.0)
MCHC: 32.1 g/dL (ref 30.0–36.0)
MCV: 93 fL (ref 80.0–100.0)
Monocytes Absolute: 0.3 10*3/uL (ref 0.1–1.0)
Monocytes Relative: 3 %
Neutro Abs: 8.4 10*3/uL — ABNORMAL HIGH (ref 1.7–7.7)
Neutrophils Relative %: 81 %
Platelets: 211 10*3/uL (ref 150–400)
RBC: 4.29 MIL/uL (ref 3.87–5.11)
RDW: 15 % (ref 11.5–15.5)
WBC: 10.3 10*3/uL (ref 4.0–10.5)
nRBC: 0 % (ref 0.0–0.2)

## 2020-09-30 LAB — COMPREHENSIVE METABOLIC PANEL
ALT: 35 U/L (ref 0–44)
AST: 29 U/L (ref 15–41)
Albumin: 2.9 g/dL — ABNORMAL LOW (ref 3.5–5.0)
Alkaline Phosphatase: 107 U/L (ref 38–126)
Anion gap: 9 (ref 5–15)
BUN: 44 mg/dL — ABNORMAL HIGH (ref 8–23)
CO2: 28 mmol/L (ref 22–32)
Calcium: 8.8 mg/dL — ABNORMAL LOW (ref 8.9–10.3)
Chloride: 100 mmol/L (ref 98–111)
Creatinine, Ser: 0.9 mg/dL (ref 0.44–1.00)
GFR, Estimated: >60 mL/min (ref >60)
Glucose, Bld: 341 mg/dL — ABNORMAL HIGH (ref 70–99)
Potassium: 4.1 mmol/L (ref 3.5–5.1)
Sodium: 137 mmol/L (ref 135–145)
Total Bilirubin: 0.8 mg/dL (ref 0.3–1.2)
Total Protein: 6.3 g/dL — ABNORMAL LOW (ref 6.5–8.1)

## 2020-09-30 LAB — GLUCOSE, CAPILLARY
Glucose-Capillary: 324 mg/dL — ABNORMAL HIGH (ref 70–99)
Glucose-Capillary: 361 mg/dL — ABNORMAL HIGH (ref 70–99)
Glucose-Capillary: 346 mg/dL — ABNORMAL HIGH (ref 70–99)
Glucose-Capillary: 405 mg/dL — ABNORMAL HIGH (ref 70–99)

## 2020-09-30 LAB — PROCALCITONIN: Procalcitonin: 9.78 ng/mL

## 2020-09-30 LAB — MAGNESIUM: Magnesium: 2 mg/dL (ref 1.7–2.4)

## 2020-09-30 NOTE — Progress Notes (Signed)
Pharmacy Antibiotic Note  Shirley Chen is a 65 y.o. female with hx necrotizing fasciitis with left below-knee amputation in May and recent COVID infection presented to the ED from skilled nursing facility on 09/25/2020 with c/o n/v.  She was started on vancomycin and cefepime on admission for sepsis. Changed to Meropenem on 6/23 for +ESBL bacterermia.  Today, 09/30/2020: - Day #4 Meropenem - Afeb - WBC improved 10.3 - PCT down 9.78 - SCr improved to 0.9  Plan: Continue Meropenem 1g IV q8h   Height: 5\' 4"  (162.6 cm) Weight: 89.1 kg (196 lb 6.4 oz) IBW/kg (Calculated) : 54.7  Temp (24hrs), Avg:98 F (36.7 C), Min:97.6 F (36.4 C), Max:98.2 F (36.8 C)  Recent Labs  Lab 09/26/20 0153 09/26/20 0758 09/26/20 1425 09/27/20 0414 09/28/20 0335 09/29/20 0434 09/30/20 0342  WBC 17.0*  --   --  12.7* 13.5* 8.2 10.3  CREATININE 2.69*  2.68*  --  2.15* 1.64* 1.07* 0.99 0.90  LATICACIDVEN  --  1.4  --   --   --   --   --      Estimated Creatinine Clearance: 68.3 mL/min (by C-G formula based on SCr of 0.9 mg/dL).    Allergies  Allergen Reactions   Losartan Nausea And Vomiting   Morphine And Related     Pt states " I just got really sick"   Antimicrobials this admission:  6/22 Vanc >> 6/23 6/22 Cefepime >>6/23 6/23 Meropenem>> 6/22 Remdesivir >> 6/26  Dose adjustments this admission:  6/24 increase from 1g q12h to 1g q8h for improved SCr  Microbiology results:  6/21 BCx x1(only aerobic bottle drawn):  ESBL E Coli (S imipenem) 6/22 Covid: positive 6/22 MRSA PCR: neg 6/24 BCx: ngtd  Thank you for allowing pharmacy to be a part of this patient's care.  7/24, PharmD, BCPS Pharmacy: [phone] 09/30/2020 10:57 AM

## 2020-09-30 NOTE — Progress Notes (Signed)
Progress Note    Shirley Chen   OIZ:[id number]  DOB: 1955/11/01  DOA: 09/25/2020     5  PCP: Marrian Salvage, FNP  CC: weakness, N/V  Hospital Course: Shirley Chen is a 65 y.o. female with medical history significant of hypertension, diabetes, peripheral vascular disease, tobacco abuse, recent necrotizing fasciitis with left below-knee amputation in May, history of femoral-tibial bypass surgery in the past who was discharged to the skilled facility where she resides.   She presented to the hospital with nausea/vomiting. On work-up she was found to be febrile, 105 degrees, lethargic.  There was concern for underlying sepsis and she was started on vancomycin and cefepime for empiric coverage.  COVID-19 testing also came back positive and she had been placed on oxygen for low normal saturations. Further work-up revealed AKI and she was started on fluids as well.  Interval History:  Pain is much better today when seen.  No events overnight.  Discussed plan for antibiotics and she understands completion will be on Thursday and we will need to figure out if she can go to nursing facility prior to then or needs to remain in the hospital until then.  ROS: Constitutional: positive for chills, fatigue, fevers, and malaise, negative for night sweats, Respiratory: negative for cough and sputum, Cardiovascular: negative for chest pain, and Gastrointestinal: negative for abdominal pain  Assessment & Plan: * Hyperkalemia-resolved as of 09/26/2020 - Treated on admission - Trend BMP  Bacteremia due to Gram-negative bacteria - see sepsis - unclear source; no prior known hx ESBL infections; recent LBKA may somehow be involved - continue meropenem - discussed with ID. Plan will be for 7 day course of meropenem. End date would be 10/04/20. 2 options would be remaining inpatient to completion vs discharge to SNF with PIV in place to administer abx (if covered since using carbapenem) until  completed (not worth placing a PICC for the days left)  COVID-19 virus infection - continue remdesivir and steroids for now - unclear if was truly hypoxic on admission; now weaned to RA on 6/22 - trend inflammatory markers   Severe sepsis ([place]) - febrile, tachypnea, leukocytosis, initially on O2 on admission - now weaned off O2 - possible source considered covid vs other u/k source currently - continue empiric vanc and cefepime; low threshold to add Flagyl if further decline or not much improvement - Trend procalcitonin (140 >>133>>54) - lactic normal -bacterial source presented itself on 6/23, 1/2 blood cx positive for E Coli ESBL. Abx modified from vanc/cefepime to monotherapy meropenem - repeat blood culture on 6/24  Acute renal failure superimposed on stage 3b chronic kidney disease ([place]) - patient has history of CKD3b. Baseline creat ~ 1.4, eGFR 42 - patient presents with increase in creat >0.3 mg/dL above baseline, creat increase >1.5x baseline presumed to have occurred within past 7 days PTA - creat 2.56 on admission; etiology presumed to be decreased oral intake from acute illness -Creatinine has responded well to fluids and her appetite has improved - Discontinue fluids and monitor renal function off  Metabolic acidosis-resolved as of 09/30/2020 - presumed from renal failure - s/p bicarb drip; responded well - transitioned IVF to LR; d/c IVF on 6/24 - trend BMP  Diabetic foot infection ([place]) - s/p L BKA on 08/08/20. Stump looks okay  Diabetes mellitus with peripheral vascular disease ([place]) - last A1c 8% on 06/21/20 - continue SSI and CBG monitoring - watch glucose while on steroids; will adjust regimen as needed  HTN (hypertension) -  Resume amlodipine - Continue holding Coreg and hydralazine  PAD (peripheral artery disease) ([facility]) - s/p L BKA on 08/08/20  Tobacco abuse - nicotine patch if needed   Old records reviewed in assessment of this  patient  Antimicrobials: Cefepime 09/26/2020 >> 09/27/2020 Vancomycin 09/26/2020 >> 09/27/2020 Meropenem 09/27/2020 >> current  DVT prophylaxis: heparin injection 5,000 Units Start: 09/25/20 2315   Code Status:   Code Status: Full Code Family Communication:   Disposition Plan: Status is: Inpatient  Remains inpatient appropriate because:Unsafe d/c plan, IV treatments appropriate due to intensity of illness or inability to take PO, and Inpatient level of care appropriate due to severity of illness  Dispo: The patient is from: SNF              Anticipated d/c is to: SNF              Patient currently is not medically stable to d/c.   Difficult to place patient No  Risk of unplanned readmission score: Unplanned Admission- Pilot do not use: 23.89   Objective: Blood pressure (!) 177/73, pulse 65, temperature 97.6 F (36.4 C), temperature source Axillary, resp. rate 18, height '5\' 4"'  (1.626 m), weight 89.1 kg, SpO2 96 %.  Examination: General appearance:  Much more comfortable appearing and resting in bed this morning in no distress Head: Normocephalic, without obvious abnormality, atraumatic Eyes:  EOMI Lungs: clear to auscultation bilaterally Heart: regular rate and rhythm and S1, S2 normal Abdomen: normal findings: bowel sounds normal and soft, non-tender Extremities:  L BKA noted with well healing stump (appropriately TTP) Skin: mobility and turgor normal Neurologic: Grossly normal  Consultants:    Procedures:    Data Reviewed: I have personally reviewed following labs and imaging studies Results for orders placed or performed during the hospital encounter of 09/25/20 (from the past 24 hour(s))  Glucose, capillary     Status: Abnormal   Collection Time: 09/29/20  4:56 PM  Result Value Ref Range   Glucose-Capillary 305 (H) 70 - 99 mg/dL  Glucose, capillary     Status: Abnormal   Collection Time: 09/29/20  9:54 PM  Result Value Ref Range   Glucose-Capillary 322 (H) 70 - 99  mg/dL  CBC with Differential/Platelet     Status: Abnormal   Collection Time: 09/30/20  3:42 AM  Result Value Ref Range   WBC 10.3 4.0 - 10.5 K/uL   RBC 4.29 3.87 - 5.11 MIL/uL   Hemoglobin 12.8 12.0 - 15.0 g/dL   HCT 39.9 36.0 - 46.0 %   MCV 93.0 80.0 - 100.0 fL   MCH 29.8 26.0 - 34.0 pg   MCHC 32.1 30.0 - 36.0 g/dL   RDW 15.0 11.5 - 15.5 %   Platelets 211 150 - 400 K/uL   nRBC 0.0 0.0 - 0.2 %   Neutrophils Relative % 81 %   Neutro Abs 8.4 (H) 1.7 - 7.7 K/uL   Lymphocytes Relative 11 %   Lymphs Abs 1.1 0.7 - 4.0 K/uL   Monocytes Relative 3 %   Monocytes Absolute 0.3 0.1 - 1.0 K/uL   Eosinophils Relative 0 %   Eosinophils Absolute 0.0 0.0 - 0.5 K/uL   Basophils Relative 1 %   Basophils Absolute 0.1 0.0 - 0.1 K/uL   Immature Granulocytes 4 %   Abs Immature Granulocytes 0.42 (H) 0.00 - 0.07 K/uL  Comprehensive metabolic panel     Status: Abnormal   Collection Time: 09/30/20  3:42 AM  Result Value Ref Range  Sodium 137 135 - 145 mmol/L   Potassium 4.1 3.5 - 5.1 mmol/L   Chloride 100 98 - 111 mmol/L   CO2 28 22 - 32 mmol/L   Glucose, Bld 341 (H) 70 - 99 mg/dL   BUN 44 (H) 8 - 23 mg/dL   Creatinine, Ser 0.90 0.44 - 1.00 mg/dL   Calcium 8.8 (L) 8.9 - 10.3 mg/dL   Total Protein 6.3 (L) 6.5 - 8.1 g/dL   Albumin 2.9 (L) 3.5 - 5.0 g/dL   AST 29 15 - 41 U/L   ALT 35 0 - 44 U/L   Alkaline Phosphatase 107 38 - 126 U/L   Total Bilirubin 0.8 0.3 - 1.2 mg/dL   GFR, Estimated >60 >60 mL/min   Anion gap 9 5 - 15  Magnesium     Status: None   Collection Time: 09/30/20  3:42 AM  Result Value Ref Range   Magnesium 2.0 1.7 - 2.4 mg/dL  Procalcitonin     Status: None   Collection Time: 09/30/20  3:42 AM  Result Value Ref Range   Procalcitonin 9.78 ng/mL  Glucose, capillary     Status: Abnormal   Collection Time: 09/30/20  8:15 AM  Result Value Ref Range   Glucose-Capillary 324 (H) 70 - 99 mg/dL   Comment 1 Notify RN   Glucose, capillary     Status: Abnormal   Collection Time:  09/30/20 11:45 AM  Result Value Ref Range   Glucose-Capillary 361 (H) 70 - 99 mg/dL    Recent Results (from the past 240 hour(s))  Resp Panel by RT-PCR (Flu A&B, Covid) Nasopharyngeal Swab     Status: Abnormal   Collection Time: 09/25/20  7:46 PM   Specimen: Nasopharyngeal Swab; Nasopharyngeal(NP) swabs in vial transport medium  Result Value Ref Range Status   SARS Coronavirus 2 by RT PCR POSITIVE (A) NEGATIVE Final    Comment: RESULT CALLED TO, READ BACK BY AND VERIFIED WITH: J LOUDERMILK AT 2050 ON 09/25/2020 BY MOSLEY,J (NOTE) SARS-CoV-2 target nucleic acids are DETECTED.  The SARS-CoV-2 RNA is generally detectable in upper respiratory specimens during the acute phase of infection. Positive results are indicative of the presence of the identified virus, but do not rule out bacterial infection or co-infection with other pathogens not detected by the test. Clinical correlation with patient history and other diagnostic information is necessary to determine patient infection status. The expected result is Negative.  Fact Sheet for Patients: EntrepreneurPulse.com.au  Fact Sheet for Healthcare Providers: IncredibleEmployment.be  This test is not yet approved or cleared by the [place] FDA and  has been authorized for detection and/or diagnosis of SARS-CoV-2 by FDA under an Emergency Use Authorization (EUA).  This EUA will remain in effect (meaning this  test can be used) for the duration of  the COVID-19 declaration under Section 564(b)(1) of the Act, 21 U.S.C. section 360bbb-3(b)(1), unless the authorization is terminated or revoked sooner.     Influenza A by PCR NEGATIVE NEGATIVE Final   Influenza B by PCR NEGATIVE NEGATIVE Final    Comment: (NOTE) The Xpert Xpress SARS-CoV-2/FLU/RSV plus assay is intended as an aid in the diagnosis of influenza from Nasopharyngeal swab specimens and should not be used as a sole basis for treatment.  Nasal washings and aspirates are unacceptable for Xpert Xpress SARS-CoV-2/FLU/RSV testing.  Fact Sheet for Patients: EntrepreneurPulse.com.au  Fact Sheet for Healthcare Providers: IncredibleEmployment.be  This test is not yet approved or cleared by the [place] FDA and has been  authorized for detection and/or diagnosis of SARS-CoV-2 by FDA under an Emergency Use Authorization (EUA). This EUA will remain in effect (meaning this test can be used) for the duration of the COVID-19 declaration under Section 564(b)(1) of the Act, 21 U.S.C. section 360bbb-3(b)(1), unless the authorization is terminated or revoked.  Performed at [facility], [place] [address] [place], [place] [address]   Culture, blood (Routine X 2) w Reflex to ID Panel     Status: Abnormal   Collection Time: 09/26/20  1:40 AM   Specimen: BLOOD  Result Value Ref Range Status   Specimen Description   Final    BLOOD BLOOD LEFT HAND Performed at [place] [address] [place], [place] [address]    Special Requests   Final    BOTTLES DRAWN AEROBIC ONLY Blood Culture adequate volume Performed at [facility] [address] [place], [place] [address]    Culture  Setup Time   Final    GRAM NEGATIVE RODS AEROBIC BOTTLE ONLY CRITICAL RESULT CALLED TO, READ BACK BY AND VERIFIED WITH: Colin Rhein [employer] [id number] 09/27/20 A BROWNING Performed at [facility], [place] [address], [place], [place] [address]    Culture (A)  Final    ESCHERICHIA COLI Confirmed Extended Spectrum Beta-Lactamase Producer (ESBL).  In bloodstream infections from ESBL organisms, carbapenems are preferred over piperacillin/tazobactam. They are shown to have a lower risk of mortality.    Report Status 09/29/2020 FINAL  Final   Organism ID, Bacteria ESCHERICHIA COLI  Final      Susceptibility   Escherichia coli - MIC*    AMPICILLIN >=32 RESISTANT Resistant      CEFAZOLIN >=64 RESISTANT Resistant     CEFEPIME >=32 RESISTANT Resistant     CEFTAZIDIME RESISTANT Resistant     CEFTRIAXONE >=64 RESISTANT Resistant     CIPROFLOXACIN >=4 RESISTANT Resistant     GENTAMICIN <=1 SENSITIVE Sensitive     IMIPENEM <=0.25 SENSITIVE Sensitive     TRIMETH/SULFA <=20 SENSITIVE Sensitive     AMPICILLIN/SULBACTAM 16 INTERMEDIATE Intermediate     PIP/TAZO <=4 SENSITIVE Sensitive     * ESCHERICHIA COLI  Blood Culture ID Panel (Reflexed)     Status: Abnormal   Collection Time: 09/26/20  1:40 AM  Result Value Ref Range Status   Enterococcus faecalis NOT DETECTED NOT DETECTED Final   Enterococcus Faecium NOT DETECTED NOT DETECTED Final   Listeria monocytogenes NOT DETECTED NOT DETECTED Final   Staphylococcus species NOT DETECTED NOT DETECTED Final   Staphylococcus aureus (BCID) NOT DETECTED NOT DETECTED Final   Staphylococcus epidermidis NOT DETECTED NOT DETECTED Final   Staphylococcus lugdunensis NOT DETECTED NOT DETECTED Final   Streptococcus species NOT DETECTED NOT DETECTED Final   Streptococcus agalactiae NOT DETECTED NOT DETECTED Final   Streptococcus pneumoniae NOT DETECTED NOT DETECTED Final   Streptococcus pyogenes NOT DETECTED NOT DETECTED Final   A.calcoaceticus-baumannii NOT DETECTED NOT DETECTED Final   Bacteroides fragilis NOT DETECTED NOT DETECTED Final   Enterobacterales DETECTED (A) NOT DETECTED Final    Comment: Enterobacterales represent a large order of gram negative bacteria, not a single organism. CRITICAL RESULT CALLED TO, READ BACK BY AND VERIFIED WITH: Colin Rhein PHARMD 0160 09/27/20 A BROWNING    Enterobacter cloacae complex NOT DETECTED NOT DETECTED Final   Escherichia coli DETECTED (A) NOT DETECTED Final    Comment: CRITICAL RESULT CALLED TO, READ BACK BY AND VERIFIED WITH: Colin Rhein PHARMD 1093 09/27/20 A BROWNING  Klebsiella aerogenes NOT DETECTED NOT DETECTED Final   Klebsiella oxytoca NOT DETECTED NOT DETECTED Final   Klebsiella  pneumoniae NOT DETECTED NOT DETECTED Final   Proteus species NOT DETECTED NOT DETECTED Final   Salmonella species NOT DETECTED NOT DETECTED Final   Serratia marcescens NOT DETECTED NOT DETECTED Final   Haemophilus influenzae NOT DETECTED NOT DETECTED Final   Neisseria meningitidis NOT DETECTED NOT DETECTED Final   Pseudomonas aeruginosa NOT DETECTED NOT DETECTED Final   Stenotrophomonas maltophilia NOT DETECTED NOT DETECTED Final   Candida albicans NOT DETECTED NOT DETECTED Final   Candida auris NOT DETECTED NOT DETECTED Final   Candida glabrata NOT DETECTED NOT DETECTED Final   Candida krusei NOT DETECTED NOT DETECTED Final   Candida parapsilosis NOT DETECTED NOT DETECTED Final   Candida tropicalis NOT DETECTED NOT DETECTED Final   Cryptococcus neoformans/gattii NOT DETECTED NOT DETECTED Final   CTX-M ESBL DETECTED (A) NOT DETECTED Final    Comment: CRITICAL RESULT CALLED TO, READ BACK BY AND VERIFIED WITH: Colin Rhein PHARMD 0093 09/27/20 A BROWNING (NOTE) Extended spectrum beta-lactamase detected. Recommend a carbapenem as initial therapy.      Carbapenem resistance IMP NOT DETECTED NOT DETECTED Final   Carbapenem resistance KPC NOT DETECTED NOT DETECTED Final   Carbapenem resistance NDM NOT DETECTED NOT DETECTED Final   Carbapenem resist OXA 48 LIKE NOT DETECTED NOT DETECTED Final   Carbapenem resistance VIM NOT DETECTED NOT DETECTED Final    Comment: Performed at [facility], [place] [address], [place], [place] [address]  MRSA PCR Screening     Status: None   Collection Time: 09/26/20  3:30 AM  Result Value Ref Range Status   MRSA by PCR NEGATIVE NEGATIVE Final    Comment:        The GeneXpert MRSA Assay (FDA approved for NASAL specimens only), is one component of a comprehensive MRSA colonization surveillance program. It is not intended to diagnose MRSA infection nor to guide or monitor treatment for MRSA infections. Performed at [facility], [place]  [address], [place], [place] [address]   Culture, blood (routine x 2)     Status: None (Preliminary result)   Collection Time: 09/28/20 10:54 AM   Specimen: BLOOD  Result Value Ref Range Status   Specimen Description   Final    BLOOD RIGHT ANTECUBITAL Performed at [facility] [place] [address] [place], [place] [address]    Special Requests   Final    BOTTLES DRAWN AEROBIC ONLY Blood Culture adequate volume Performed at [facility] [address], [place], [place] [address]    Culture   Final    NO GROWTH 2 DAYS Performed at [facility] [address], [place], [place] [address]    Report Status PENDING  Incomplete  Culture, blood (routine x 2)     Status: None (Preliminary result)   Collection Time: 09/28/20 10:54 AM   Specimen: BLOOD RIGHT HAND  Result Value Ref Range Status   Specimen Description   Final    BLOOD RIGHT HAND Performed at [facility] [address], [place], [place] [address]    Special Requests   Final    BOTTLES DRAWN AEROBIC ONLY Blood Culture results may not be optimal due to an inadequate volume of blood received in culture bottles Performed at [facility] [address], [place], [place] [address]    Culture   Final    NO  GROWTH 2 DAYS Performed at [facility], [place] [address], [place], [place] [address]    Report Status PENDING  Incomplete     Radiology Studies: No results found. DG ABD ACUTE 2+V W 1V CHEST  Final Result      Scheduled Meds:  amLODipine  10 mg Oral Daily   feeding supplement  1 Container Oral BID BM   gabapentin  300 mg Oral TID   heparin  5,000 Units Subcutaneous Q8H   insulin aspart  0-5 Units Subcutaneous QHS   insulin aspart  0-9 Units Subcutaneous TID WC   insulin glargine  26 Units Subcutaneous Daily   methylPREDNISolone (SOLU-MEDROL) injection  60 mg Intravenous BID   PRN Meds: acetaminophen, acetaminophen **OR**  [DISCONTINUED] acetaminophen, calcium carbonate (dosed in mg elemental calcium), camphor-menthol **AND** hydrOXYzine, docusate sodium, hydrALAZINE, labetalol, ondansetron **OR** ondansetron (ZOFRAN) IV, oxyCODONE, sorbitol, zolpidem Continuous Infusions:  meropenem (MERREM) IV 1 g (09/30/20 1344)     LOS: 5 days  Time spent: Greater than 50% of the 35 minute visit was spent in counseling/coordination of care for the patient as laid out in the A&P.   Dwyane Dee, MD [facility] 09/30/2020, 1:50 PM

## 2020-10-01 LAB — COMPREHENSIVE METABOLIC PANEL
ALT: 33 U/L (ref 0–44)
AST: 20 U/L (ref 15–41)
Albumin: 2.7 g/dL — ABNORMAL LOW (ref 3.5–5.0)
Alkaline Phosphatase: 97 U/L (ref 38–126)
Anion gap: 7 (ref 5–15)
BUN: 45 mg/dL — ABNORMAL HIGH (ref 8–23)
CO2: 27 mmol/L (ref 22–32)
Calcium: 8.7 mg/dL — ABNORMAL LOW (ref 8.9–10.3)
Chloride: 104 mmol/L (ref 98–111)
Creatinine, Ser: 0.9 mg/dL (ref 0.44–1.00)
GFR, Estimated: >60 mL/min (ref >60)
Glucose, Bld: 361 mg/dL — ABNORMAL HIGH (ref 70–99)
Potassium: 4.2 mmol/L (ref 3.5–5.1)
Sodium: 138 mmol/L (ref 135–145)
Total Bilirubin: 0.8 mg/dL (ref 0.3–1.2)
Total Protein: 6 g/dL — ABNORMAL LOW (ref 6.5–8.1)

## 2020-10-01 LAB — GLUCOSE, CAPILLARY
Glucose-Capillary: 335 mg/dL — ABNORMAL HIGH (ref 70–99)
Glucose-Capillary: 347 mg/dL — ABNORMAL HIGH (ref 70–99)
Glucose-Capillary: 363 mg/dL — ABNORMAL HIGH (ref 70–99)
Glucose-Capillary: 369 mg/dL — ABNORMAL HIGH (ref 70–99)

## 2020-10-01 LAB — CBC WITH DIFFERENTIAL/PLATELET
Band Neutrophils: 1 %
Basophils Relative: 0 %
Blasts: NONE SEEN %
Eosinophils Relative: 0 %
HCT: 38.7 % (ref 36.0–46.0)
Hemoglobin: 12.6 g/dL (ref 12.0–15.0)
Lymphocytes Relative: 8 %
MCH: 29.9 pg (ref 26.0–34.0)
MCHC: 32.6 g/dL (ref 30.0–36.0)
MCV: 91.9 fL (ref 80.0–100.0)
Metamyelocytes Relative: NONE SEEN %
Monocytes Relative: 8 %
Myelocytes: 1 %
Neutrophils Relative %: 82 %
Platelets: 213 10*3/uL (ref 150–400)
Promyelocytes Relative: NONE SEEN %
RBC Morphology: NORMAL
RBC: 4.21 MIL/uL (ref 3.87–5.11)
RDW: 14.6 % (ref 11.5–15.5)
WBC Morphology: NORMAL
WBC: 10.3 10*3/uL (ref 4.0–10.5)
nRBC: 0 % (ref 0.0–0.2)
nRBC: NONE SEEN /100 WBC

## 2020-10-01 LAB — PROCALCITONIN: Procalcitonin: 4.33 ng/mL

## 2020-10-01 LAB — MAGNESIUM: Magnesium: 2.3 mg/dL (ref 1.7–2.4)

## 2020-10-01 NOTE — Progress Notes (Signed)
Physical Therapy Treatment Patient Details Name: Shirley Chen MRN: [id number] DOB: 07-23-1955 Today's Date: 10/01/2020    History of Present Illness 65 y.o. female with admitted with sepsis. found to be covid positive also.  PMH: hypertension, diabetes, peripheral vascular disease, tobacco abuse, recent necrotizing fasciitis with left below-knee amputation in May, history of femoral-tibial bypass surgery in the past who was discharged to the skilled facility.    PT Comments    Progressing with mobility but pt reports being weaker now than she was on admission. She is somewhat discouraged by this. Would benefit from increased activity (OOB to chair/bsc with nursing). Requested OT consult to increase therapy sessions and activity. Will continue to progress activity as tolerated.    Follow Up Recommendations  SNF     Equipment Recommendations   (TBD at next venue)    Recommendations for Other Services       Precautions / Restrictions Precautions Precautions: Fall Restrictions Weight Bearing Restrictions: No    Mobility  Bed Mobility Overal bed mobility: Needs Assistance Bed Mobility: Supine to Sit     Supine to sit: Min guard;HOB elevated     General bed mobility comments: for safety and lines    Transfers Overall transfer level: Needs assistance Equipment used: Rolling walker (2 wheeled) Transfers: Sit to/from Stand Sit to Stand: Min assist;From elevated surface         General transfer comment: Assist to rise, steady, control descent. Cues for safety, technique, hand placement. Increased time. Unsteady.  Ambulation/Gait Ambulation/Gait assistance: Min assist Gait Distance (Feet): 2 Feet Assistive device: Rolling walker (2 wheeled) Gait Pattern/deviations: Step-to pattern     General Gait Details: Pt was able to take two unsteady hopping steps before becoming fatigued. Brought recliner up behind pt to sit.   Stairs             Wheelchair Mobility     Modified Rankin (Stroke Patients Only)       Balance Overall balance assessment: Needs assistance         Standing balance support: Bilateral upper extremity supported Standing balance-Leahy Scale: Poor                              Cognition Arousal/Alertness: Awake/alert Behavior During Therapy: WFL for tasks assessed/performed Overall Cognitive Status: Within Functional Limits for tasks assessed                                        Exercises General Exercises - Lower Extremity Long Arc Quad: AROM;Both;15 reps;Seated (x2) Hip Flexion/Marching: AROM;Both;15 reps;Seated (x2)    General Comments        Pertinent Vitals/Pain Pain Assessment: No/denies pain    Home Living                      Prior Function            PT Goals (current goals can now be found in the care plan section) Progress towards PT goals: Progressing toward goals    Frequency    Min 3X/week      PT Plan Current plan remains appropriate    Co-evaluation              AM-PAC PT "6 Clicks" Mobility   Outcome Measure  Help needed turning from your back to your side while in  a flat bed without using bedrails?: A Little Help needed moving from lying on your back to sitting on the side of a flat bed without using bedrails?: A Little Help needed moving to and from a bed to a chair (including a wheelchair)?: A Little Help needed standing up from a chair using your arms (e.g., wheelchair or bedside chair)?: A Little Help needed to walk in hospital room?: A Lot Help needed climbing 3-5 steps with a railing? : Total 6 Click Score: 15    End of Session   Activity Tolerance: Patient tolerated treatment well;Patient limited by fatigue Patient left: in chair;with call bell/phone within reach;with chair alarm set   PT Visit Diagnosis: Muscle weakness (generalized) (M62.81);Other abnormalities of gait and mobility (R26.89)     Time: 5277-8242 PT  Time Calculation (min) (ACUTE ONLY): 28 min  Charges:  $Gait Training: 8-22 mins $Therapeutic Exercise: 8-22 mins                        Faye Ramsay, PT Acute Rehabilitation  Office: [phone] Pager: [phone]

## 2020-10-01 NOTE — Progress Notes (Signed)
Inpatient Diabetes Program Recommendations  AACE/ADA: New Consensus Statement on Inpatient Glycemic Control (2015)  Target Ranges:  Prepandial:   less than 140 mg/dL      Peak postprandial:   less than 180 mg/dL (1-2 hours)      Critically ill patients:  140 - 180 mg/dL   Lab Results  Component Value Date   GLUCAP 335 (H) 10/01/2020   HGBA1C 7.0 (H) 09/26/2020    Review of Glycemic Control  Diabetes history: DM2 Outpatient Diabetes medications: Lantus 26 units QHS, Novolog 0-15 units TID, metformin 500 mg BID Current orders for Inpatient glycemic control: Lantus 26 units QD, Novolog 0-9 units TID with meals and 0-5 HS  HgbA1C - 7.0% Steroids d/ced although post-prandials elevated.  Inpatient Diabetes Program Recommendations:    Consider adding Novolog 4 units TID with meals   Continue to follow.  Thank you. Ailene Ards, RD, LDN, CDE Inpatient Diabetes Coordinator [phone]

## 2020-10-01 NOTE — TOC Progression Note (Signed)
Transition of Care [facility]) - Progression Note    Patient Details  Name: Shirley Chen MRN: [id number] Date of Birth: [birth year]  [facility] CM/SW Contact  Darleene Cleaver, [place] Phone Number: 10/01/2020, 5:45 PM  Clinical Narrative:     CSW sent updated clinicals for SNF placement, awaiting bed offers.  Expected Discharge Plan: Skilled Nursing Facility Barriers to Discharge: SNF Pending bed offer  Expected Discharge Plan and Services Expected Discharge Plan: Skilled Nursing Facility     Post Acute Care Choice: Home Health Living arrangements for the past 2 months: Skilled Nursing Facility                                       Social Determinants of Health (SDOH) Interventions    Readmission Risk Interventions No flowsheet data found.

## 2020-10-01 NOTE — Progress Notes (Signed)
Progress Note    Shirley Chen   PPI:[id number]  DOB: [birth year]  DOA: 09/25/2020     6  PCP: Marrian Salvage, FNP  CC: weakness, N/V  Hospital Course: Shirley Chen is a 65 y.o. female with medical history significant of hypertension, diabetes, peripheral vascular disease, tobacco abuse, recent necrotizing fasciitis with left below-knee amputation in May, history of femoral-tibial bypass surgery in the past who was discharged to the skilled facility where she resides.   She presented to the hospital with nausea/vomiting. On work-up she was found to be febrile, 105 degrees, lethargic.  There was concern for underlying sepsis and she was started on vancomycin and cefepime for empiric coverage.  COVID-19 testing also came back positive and she had been placed on oxygen for low normal saturations. Further work-up revealed AKI and she was started on fluids as well.  Interval History:  No events overnight. Resting in bed comfortably; pain still controlled and improved.   ROS: Constitutional: positive for chills, fatigue, fevers, and malaise, negative for night sweats, Respiratory: negative for cough and sputum, Cardiovascular: negative for chest pain, and Gastrointestinal: negative for abdominal pain  Assessment & Plan: * Hyperkalemia-resolved as of 09/26/2020 - Treated on admission - Trend BMP  Bacteremia due to Gram-negative bacteria - see sepsis - unclear source; no prior known hx ESBL infections; recent LBKA may somehow be involved - continue meropenem - discussed with ID. Plan will be for 7 day course of meropenem. End date would be 10/04/20.  COVID-19 virus infection - continue remdesivir and steroids for now - unclear if was truly hypoxic on admission; now weaned to RA on 6/22 - trend inflammatory markers   Severe sepsis ([place]) - febrile, tachypnea, leukocytosis, initially on O2 on admission - now weaned off O2 - possible source considered covid vs other u/k  source currently - continue empiric vanc and cefepime; low threshold to add Flagyl if further decline or not much improvement - Trend procalcitonin (140 >>133>>54) - lactic normal -bacterial source presented itself on 6/23, 1/2 blood cx positive for E Coli ESBL. Abx modified from vanc/cefepime to monotherapy meropenem - repeat blood culture on 6/24  Acute renal failure superimposed on stage 3b chronic kidney disease ([place]) - patient has history of CKD3b. Baseline creat ~ 1.4, eGFR 42 - patient presents with increase in creat >0.3 mg/dL above baseline, creat increase >1.5x baseline presumed to have occurred within past 7 days PTA - creat 2.56 on admission; etiology presumed to be decreased oral intake from acute illness -Creatinine has responded well to fluids and her appetite has improved - Discontinue fluids and monitor renal function off  Metabolic acidosis-resolved as of 09/30/2020 - presumed from renal failure - s/p bicarb drip; responded well - transitioned IVF to LR; d/c IVF on 6/24 - trend BMP  Diabetic foot infection ([place]) - s/p L BKA on 08/08/20. Stump looks okay  Diabetes mellitus with peripheral vascular disease ([place]) - last A1c 8% on 06/21/20 - continue SSI and CBG monitoring - stopped steroids on 6/27. CBGs should start to improve now  HTN (hypertension) - Resume amlodipine - Continue holding Coreg and hydralazine  PAD (peripheral artery disease) ([facility]) - s/p L BKA on 08/08/20  Tobacco abuse - nicotine patch if needed   Old records reviewed in assessment of this patient  Antimicrobials: Cefepime 09/26/2020 >> 09/27/2020 Vancomycin 09/26/2020 >> 09/27/2020 Meropenem 09/27/2020 >> current  DVT prophylaxis: heparin injection 5,000 Units Start: 09/25/20 2315   Code Status:   Code Status: Full  Code Family Communication:   Disposition Plan: Status is: Inpatient  Remains inpatient appropriate because:Unsafe d/c plan, IV treatments appropriate due to intensity of  illness or inability to take PO, and Inpatient level of care appropriate due to severity of illness  Dispo: The patient is from: SNF              Anticipated d/c is to: SNF              Patient currently is not medically stable to d/c.   Difficult to place patient No  Risk of unplanned readmission score: Unplanned Admission- Pilot do not use: 22.57   Objective: Blood pressure (!) 163/65, pulse 61, temperature 98 F (36.7 C), temperature source Oral, resp. rate 18, height '5\' 4"'  (1.626 m), weight 90 kg, SpO2 99 %.  Examination: General appearance:  Much more comfortable appearing and resting in bed this morning in no distress Head: Normocephalic, without obvious abnormality, atraumatic Eyes:  EOMI Lungs: clear to auscultation bilaterally Heart: regular rate and rhythm and S1, S2 normal Abdomen: normal findings: bowel sounds normal and soft, non-tender Extremities:  L BKA noted with well healing stump (appropriately TTP) Skin: mobility and turgor normal Neurologic: Grossly normal  Consultants:    Procedures:    Data Reviewed: I have personally reviewed following labs and imaging studies Results for orders placed or performed during the hospital encounter of 09/25/20 (from the past 24 hour(s))  Glucose, capillary     Status: Abnormal   Collection Time: 09/30/20  4:18 PM  Result Value Ref Range   Glucose-Capillary 346 (H) 70 - 99 mg/dL  Glucose, capillary     Status: Abnormal   Collection Time: 09/30/20  8:56 PM  Result Value Ref Range   Glucose-Capillary 405 (H) 70 - 99 mg/dL  CBC with Differential/Platelet     Status: None   Collection Time: 10/01/20  3:33 AM  Result Value Ref Range   WBC 10.3 4.0 - 10.5 K/uL   RBC 4.21 3.87 - 5.11 MIL/uL   Hemoglobin 12.6 12.0 - 15.0 g/dL   HCT 38.7 36.0 - 46.0 %   MCV 91.9 80.0 - 100.0 fL   MCH 29.9 26.0 - 34.0 pg   MCHC 32.6 30.0 - 36.0 g/dL   RDW 14.6 11.5 - 15.5 %   Platelets 213 150 - 400 K/uL   nRBC 0.0 0.0 - 0.2 %    Neutrophils Relative % 82 %   Band Neutrophils 1 %   Lymphocytes Relative 8 %   Monocytes Relative 8 %   Eosinophils Relative 0 %   Basophils Relative 0 %   WBC Morphology NORMAL    RBC Morphology NORMAL    Smear Review DONE    nRBC NONE SEEN 0 /100 WBC   Metamyelocytes Relative NONE SEEN %   Myelocytes 1 %   Promyelocytes Relative NONE SEEN %   Blasts NONE SEEN %  Comprehensive metabolic panel     Status: Abnormal   Collection Time: 10/01/20  3:33 AM  Result Value Ref Range   Sodium 138 135 - 145 mmol/L   Potassium 4.2 3.5 - 5.1 mmol/L   Chloride 104 98 - 111 mmol/L   CO2 27 22 - 32 mmol/L   Glucose, Bld 361 (H) 70 - 99 mg/dL   BUN 45 (H) 8 - 23 mg/dL   Creatinine, Ser 0.90 0.44 - 1.00 mg/dL   Calcium 8.7 (L) 8.9 - 10.3 mg/dL   Total Protein 6.0 (L) 6.5 - 8.1  g/dL   Albumin 2.7 (L) 3.5 - 5.0 g/dL   AST 20 15 - 41 U/L   ALT 33 0 - 44 U/L   Alkaline Phosphatase 97 38 - 126 U/L   Total Bilirubin 0.8 0.3 - 1.2 mg/dL   GFR, Estimated >60 >60 mL/min   Anion gap 7 5 - 15  Magnesium     Status: None   Collection Time: 10/01/20  3:33 AM  Result Value Ref Range   Magnesium 2.3 1.7 - 2.4 mg/dL  Procalcitonin     Status: None   Collection Time: 10/01/20  3:33 AM  Result Value Ref Range   Procalcitonin 4.33 ng/mL  Glucose, capillary     Status: Abnormal   Collection Time: 10/01/20  8:23 AM  Result Value Ref Range   Glucose-Capillary 335 (H) 70 - 99 mg/dL  Glucose, capillary     Status: Abnormal   Collection Time: 10/01/20 12:42 PM  Result Value Ref Range   Glucose-Capillary 347 (H) 70 - 99 mg/dL    Recent Results (from the past 240 hour(s))  Resp Panel by RT-PCR (Flu A&B, Covid) Nasopharyngeal Swab     Status: Abnormal   Collection Time: 09/25/20  7:46 PM   Specimen: Nasopharyngeal Swab; Nasopharyngeal(NP) swabs in vial transport medium  Result Value Ref Range Status   SARS Coronavirus 2 by RT PCR POSITIVE (A) NEGATIVE Final    Comment: RESULT CALLED TO, READ BACK BY AND  VERIFIED WITH: J LOUDERMILK AT 2050 ON 09/25/2020 BY MOSLEY,J (NOTE) SARS-CoV-2 target nucleic acids are DETECTED.  The SARS-CoV-2 RNA is generally detectable in upper respiratory specimens during the acute phase of infection. Positive results are indicative of the presence of the identified virus, but do not rule out bacterial infection or co-infection with other pathogens not detected by the test. Clinical correlation with patient history and other diagnostic information is necessary to determine patient infection status. The expected result is Negative.  Fact Sheet for Patients: EntrepreneurPulse.com.au  Fact Sheet for Healthcare Providers: IncredibleEmployment.be  This test is not yet approved or cleared by the [place] FDA and  has been authorized for detection and/or diagnosis of SARS-CoV-2 by FDA under an Emergency Use Authorization (EUA).  This EUA will remain in effect (meaning this  test can be used) for the duration of  the COVID-19 declaration under Section 564(b)(1) of the Act, 21 U.S.C. section 360bbb-3(b)(1), unless the authorization is terminated or revoked sooner.     Influenza A by PCR NEGATIVE NEGATIVE Final   Influenza B by PCR NEGATIVE NEGATIVE Final    Comment: (NOTE) The Xpert Xpress SARS-CoV-2/FLU/RSV plus assay is intended as an aid in the diagnosis of influenza from Nasopharyngeal swab specimens and should not be used as a sole basis for treatment. Nasal washings and aspirates are unacceptable for Xpert Xpress SARS-CoV-2/FLU/RSV testing.  Fact Sheet for Patients: EntrepreneurPulse.com.au  Fact Sheet for Healthcare Providers: IncredibleEmployment.be  This test is not yet approved or cleared by the [place] FDA and has been authorized for detection and/or diagnosis of SARS-CoV-2 by FDA under an Emergency Use Authorization (EUA). This EUA will remain in effect (meaning  this test can be used) for the duration of the COVID-19 declaration under Section 564(b)(1) of the Act, 21 U.S.C. section 360bbb-3(b)(1), unless the authorization is terminated or revoked.  Performed at [facility], [place] [address] [place], [place] [address]   Culture, blood (Routine X 2) w Reflex to ID Panel     Status: Abnormal  Collection Time: 09/26/20  1:40 AM   Specimen: BLOOD  Result Value Ref Range Status   Specimen Description   Final    BLOOD BLOOD LEFT HAND Performed at [place] [address] [place], [place] [address]    Special Requests   Final    BOTTLES DRAWN AEROBIC ONLY Blood Culture adequate volume Performed at [facility] [address] [place], [place] [address]    Culture  Setup Time   Final    GRAM NEGATIVE RODS AEROBIC BOTTLE ONLY CRITICAL RESULT CALLED TO, READ BACK BY AND VERIFIED WITH: Colin Rhein [facility] 1761 09/27/20 A BROWNING Performed at [facility], [place] [address], [place], [place] [address]    Culture (A)  Final    ESCHERICHIA COLI Confirmed Extended Spectrum Beta-Lactamase Producer (ESBL).  In bloodstream infections from ESBL organisms, carbapenems are preferred over piperacillin/tazobactam. They are shown to have a lower risk of mortality.    Report Status 09/29/2020 FINAL  Final   Organism ID, Bacteria ESCHERICHIA COLI  Final      Susceptibility   Escherichia coli - MIC*    AMPICILLIN >=32 RESISTANT Resistant     CEFAZOLIN >=64 RESISTANT Resistant     CEFEPIME >=32 RESISTANT Resistant     CEFTAZIDIME RESISTANT Resistant     CEFTRIAXONE >=64 RESISTANT Resistant     CIPROFLOXACIN >=4 RESISTANT Resistant     GENTAMICIN <=1 SENSITIVE Sensitive     IMIPENEM <=0.25 SENSITIVE Sensitive     TRIMETH/SULFA <=20 SENSITIVE Sensitive     AMPICILLIN/SULBACTAM 16 INTERMEDIATE Intermediate     PIP/TAZO <=4 SENSITIVE Sensitive     * ESCHERICHIA COLI  Blood Culture ID Panel (Reflexed)      Status: Abnormal   Collection Time: 09/26/20  1:40 AM  Result Value Ref Range Status   Enterococcus faecalis NOT DETECTED NOT DETECTED Final   Enterococcus Faecium NOT DETECTED NOT DETECTED Final   Listeria monocytogenes NOT DETECTED NOT DETECTED Final   Staphylococcus species NOT DETECTED NOT DETECTED Final   Staphylococcus aureus (BCID) NOT DETECTED NOT DETECTED Final   Staphylococcus epidermidis NOT DETECTED NOT DETECTED Final   Staphylococcus lugdunensis NOT DETECTED NOT DETECTED Final   Streptococcus species NOT DETECTED NOT DETECTED Final   Streptococcus agalactiae NOT DETECTED NOT DETECTED Final   Streptococcus pneumoniae NOT DETECTED NOT DETECTED Final   Streptococcus pyogenes NOT DETECTED NOT DETECTED Final   A.calcoaceticus-baumannii NOT DETECTED NOT DETECTED Final   Bacteroides fragilis NOT DETECTED NOT DETECTED Final   Enterobacterales DETECTED (A) NOT DETECTED Final    Comment: Enterobacterales represent a large order of gram negative bacteria, not a single organism. CRITICAL RESULT CALLED TO, READ BACK BY AND VERIFIED WITH: Colin Rhein PHARMD 1062 09/27/20 A BROWNING    Enterobacter cloacae complex NOT DETECTED NOT DETECTED Final   Escherichia coli DETECTED (A) NOT DETECTED Final    Comment: CRITICAL RESULT CALLED TO, READ BACK BY AND VERIFIED WITH: Colin Rhein PHARMD 6948 09/27/20 A BROWNING    Klebsiella aerogenes NOT DETECTED NOT DETECTED Final   Klebsiella oxytoca NOT DETECTED NOT DETECTED Final   Klebsiella pneumoniae NOT DETECTED NOT DETECTED Final   Proteus species NOT DETECTED NOT DETECTED Final   Salmonella species NOT DETECTED NOT DETECTED Final   Serratia marcescens NOT DETECTED NOT DETECTED Final   Haemophilus influenzae NOT DETECTED NOT DETECTED Final   Neisseria meningitidis NOT DETECTED NOT DETECTED Final   Pseudomonas aeruginosa NOT DETECTED NOT DETECTED Final   Stenotrophomonas maltophilia  NOT DETECTED NOT DETECTED Final   Candida albicans NOT DETECTED NOT  DETECTED Final   Candida auris NOT DETECTED NOT DETECTED Final   Candida glabrata NOT DETECTED NOT DETECTED Final   Candida krusei NOT DETECTED NOT DETECTED Final   Candida parapsilosis NOT DETECTED NOT DETECTED Final   Candida tropicalis NOT DETECTED NOT DETECTED Final   Cryptococcus neoformans/gattii NOT DETECTED NOT DETECTED Final   CTX-M ESBL DETECTED (A) NOT DETECTED Final    Comment: CRITICAL RESULT CALLED TO, READ BACK BY AND VERIFIED WITH: Colin Rhein PHARMD 0539 09/27/20 A BROWNING (NOTE) Extended spectrum beta-lactamase detected. Recommend a carbapenem as initial therapy.      Carbapenem resistance IMP NOT DETECTED NOT DETECTED Final   Carbapenem resistance KPC NOT DETECTED NOT DETECTED Final   Carbapenem resistance NDM NOT DETECTED NOT DETECTED Final   Carbapenem resist OXA 48 LIKE NOT DETECTED NOT DETECTED Final   Carbapenem resistance VIM NOT DETECTED NOT DETECTED Final    Comment: Performed at [facility], [place] [address], [place], [place] [address]  MRSA PCR Screening     Status: None   Collection Time: 09/26/20  3:30 AM  Result Value Ref Range Status   MRSA by PCR NEGATIVE NEGATIVE Final    Comment:        The GeneXpert MRSA Assay (FDA approved for NASAL specimens only), is one component of a comprehensive MRSA colonization surveillance program. It is not intended to diagnose MRSA infection nor to guide or monitor treatment for MRSA infections. Performed at [facility], [place] [address], [place], [place] [address]   Culture, blood (routine x 2)     Status: None (Preliminary result)   Collection Time: 09/28/20 10:54 AM   Specimen: BLOOD  Result Value Ref Range Status   Specimen Description   Final    BLOOD RIGHT ANTECUBITAL Performed at [facility] [address], [place],  [place] [address]    Special Requests   Final    BOTTLES DRAWN AEROBIC ONLY Blood Culture adequate volume Performed at [facility] [address] [place], [place] [address]    Culture   Final    NO GROWTH 3 DAYS Performed at [facility], [place] [address], [place], [place] [address]    Report Status PENDING  Incomplete  Culture, blood (routine x 2)     Status: None (Preliminary result)   Collection Time: 09/28/20 10:54 AM   Specimen: BLOOD RIGHT HAND  Result Value Ref Range Status   Specimen Description   Final    BLOOD RIGHT HAND Performed at [facility] [address], [place], [place] [address]    Special Requests   Final    BOTTLES DRAWN AEROBIC ONLY Blood Culture results may not be optimal due to an inadequate volume of blood received in culture bottles Performed at [facility] [address] [place], [place] [address]    Culture   Final    NO GROWTH 3 DAYS Performed at [facility], [place] [address], [place], [place] [address]    Report Status PENDING  Incomplete     Radiology Studies: No results found. DG ABD ACUTE 2+V W 1V CHEST  Final Result      Scheduled Meds:  amLODipine  10 mg Oral Daily   feeding supplement  1 Container Oral BID BM   gabapentin  300 mg Oral TID   heparin  5,000 Units Subcutaneous Q8H  insulin aspart  0-5 Units Subcutaneous QHS   insulin aspart  0-9 Units Subcutaneous TID WC   insulin glargine  26 Units Subcutaneous Daily   PRN Meds: acetaminophen, acetaminophen **OR** [DISCONTINUED] acetaminophen, calcium carbonate (dosed in mg elemental calcium), camphor-menthol **AND** hydrOXYzine, docusate sodium, hydrALAZINE, labetalol, ondansetron **OR** ondansetron (ZOFRAN) IV, oxyCODONE, sorbitol, zolpidem Continuous Infusions:  meropenem (MERREM) IV 1 g (10/01/20 1246)     LOS: 6 days  Time spent: Greater than 50% of the 35 minute visit was spent in counseling/coordination of care for the patient as laid out in the A&P.   Dwyane Dee, MD [facility] 10/01/2020, 2:58 PM

## 2020-10-01 NOTE — Care Management Important Message (Signed)
Important Message  Patient Details IM Letter given to the Patient. Name: Shirley Chen MRN: [id number] Date of Birth: October 20, 1955   Medicare Important Message Given:  Yes     Caren Macadam 10/01/2020, 2:50 PM

## 2020-10-02 LAB — GLUCOSE, CAPILLARY
Glucose-Capillary: 136 mg/dL — ABNORMAL HIGH (ref 70–99)
Glucose-Capillary: 131 mg/dL — ABNORMAL HIGH (ref 70–99)
Glucose-Capillary: 163 mg/dL — ABNORMAL HIGH (ref 70–99)
Glucose-Capillary: 200 mg/dL — ABNORMAL HIGH (ref 70–99)

## 2020-10-02 NOTE — Progress Notes (Signed)
Progress Note    Nastassia Bazaldua   GHW:[id number]  DOB: 1956-02-27  DOA: 09/25/2020     7  PCP: Olive Bass, FNP  CC: weakness, N/V  Hospital Course: Calleigh Lafontant is a 65 y.o. female with medical history significant of hypertension, diabetes, peripheral vascular disease, tobacco abuse, recent necrotizing fasciitis with left below-knee amputation in May, history of femoral-tibial bypass surgery in the past who was discharged to the skilled facility where she resides.   She presented to the hospital with nausea/vomiting. On work-up she was found to be febrile, 105 degrees, lethargic.  There was concern for underlying sepsis and she was started on vancomycin and cefepime for empiric coverage.  COVID-19 testing also came back positive and she had been placed on oxygen for low normal saturations. Further work-up revealed AKI and she was started on fluids as well.  Interval History:  Patient was seen and examined today.  No new complaint.  She does not want to go back to her original facility.  Assessment & Plan:  Old records reviewed in assessment of this patient  Gram-negative bacteremia with ESBL E. coli.  Procalcitonin improving.  Remained afebrile. -Continue with meropenem-we will complete course on 10/04/2020.  COVID-19 infection.  Initially requiring oxygen, now on room air. Completed a course of remdesivir. -Continue with supplement and supportive care  AKI with CKD stage IIIb.  AKI resolved. Creatinine stable and at baseline. -Continue to monitor renal function -Avoid nephrotoxins.  Diabetes mellitus with peripheral vascular disease.  Uncontrolled with A1c of 8 checked in March 2022.  S/p left BKA on 08/08/2020.  Stump look okay. -Continue with SSI  Hypertension.  Blood pressure within goal. -Continue with current management  Tobacco abuse. -Continue with nicotine patch  Antimicrobials: Cefepime 09/26/2020 >> 09/27/2020 Vancomycin 09/26/2020 >>  09/27/2020 Meropenem 09/27/2020 >> current  DVT prophylaxis: heparin injection 5,000 Units Start: 09/25/20 2315   Code Status:   Code Status: Full Code Family Communication:   Disposition Plan: Status is: Inpatient  Remains inpatient appropriate because:Unsafe d/c plan, IV treatments appropriate due to intensity of illness or inability to take PO, and Inpatient level of care appropriate due to severity of illness  Dispo: The patient is from: SNF              Anticipated d/c is to: SNF              Patient currently is not medically stable to d/c.   Difficult to place patient No  Risk of unplanned readmission score: Unplanned Admission- Pilot do not use: 22.93   Objective: Blood pressure 139/77, pulse (!) 55, temperature (!) 97.5 F (36.4 C), temperature source Oral, resp. rate 18, height 5\' 4"  (1.626 m), weight 89.9 kg, SpO2 100 %.  Examination: General.  Well-developed, obese lady, in no acute distress. Pulmonary.  Lungs clear bilaterally, normal respiratory effort. CV.  Regular rate and rhythm, no JVD, rub or murmur. Abdomen.  Soft, nontender, nondistended, BS positive. CNS.  Alert and oriented.  No focal neurologic deficit. Extremities.  No LE edema, no cyanosis, L BKA, R first ray amputation. Psychiatry.  Judgment and insight appears normal.   Consultants:    Procedures:    Data Reviewed: I have personally reviewed following labs and imaging studies Results for orders placed or performed during the hospital encounter of 09/25/20 (from the past 24 hour(s))  Glucose, capillary     Status: Abnormal   Collection Time: 10/01/20  9:10 PM  Result Value Ref Range  Glucose-Capillary 369 (H) 70 - 99 mg/dL  Glucose, capillary     Status: Abnormal   Collection Time: 10/02/20  7:57 AM  Result Value Ref Range   Glucose-Capillary 136 (H) 70 - 99 mg/dL  Glucose, capillary     Status: Abnormal   Collection Time: 10/02/20 10:55 AM  Result Value Ref Range   Glucose-Capillary 131  (H) 70 - 99 mg/dL  Glucose, capillary     Status: Abnormal   Collection Time: 10/02/20  4:15 PM  Result Value Ref Range   Glucose-Capillary 163 (H) 70 - 99 mg/dL    Recent Results (from the past 240 hour(s))  Resp Panel by RT-PCR (Flu A&B, Covid) Nasopharyngeal Swab     Status: Abnormal   Collection Time: 09/25/20  7:46 PM   Specimen: Nasopharyngeal Swab; Nasopharyngeal(NP) swabs in vial transport medium  Result Value Ref Range Status   SARS Coronavirus 2 by RT PCR POSITIVE (A) NEGATIVE Final    Comment: RESULT CALLED TO, READ BACK BY AND VERIFIED WITH: J LOUDERMILK AT 2050 ON 09/25/2020 BY MOSLEY,J (NOTE) SARS-CoV-2 target nucleic acids are DETECTED.  The SARS-CoV-2 RNA is generally detectable in upper respiratory specimens during the acute phase of infection. Positive results are indicative of the presence of the identified virus, but do not rule out bacterial infection or co-infection with other pathogens not detected by the test. Clinical correlation with patient history and other diagnostic information is necessary to determine patient infection status. The expected result is Negative.  Fact Sheet for Patients: BloggerCourse.com  Fact Sheet for Healthcare Providers: SeriousBroker.it  This test is not yet approved or cleared by the [place] FDA and  has been authorized for detection and/or diagnosis of SARS-CoV-2 by FDA under an Emergency Use Authorization (EUA).  This EUA will remain in effect (meaning this  test can be used) for the duration of  the COVID-19 declaration under Section 564(b)(1) of the Act, 21 U.S.C. section 360bbb-3(b)(1), unless the authorization is terminated or revoked sooner.     Influenza A by PCR NEGATIVE NEGATIVE Final   Influenza B by PCR NEGATIVE NEGATIVE Final    Comment: (NOTE) The Xpert Xpress SARS-CoV-2/FLU/RSV plus assay is intended as an aid in the diagnosis of influenza from  Nasopharyngeal swab specimens and should not be used as a sole basis for treatment. Nasal washings and aspirates are unacceptable for Xpert Xpress SARS-CoV-2/FLU/RSV testing.  Fact Sheet for Patients: BloggerCourse.com  Fact Sheet for Healthcare Providers: SeriousBroker.it  This test is not yet approved or cleared by the [place] FDA and has been authorized for detection and/or diagnosis of SARS-CoV-2 by FDA under an Emergency Use Authorization (EUA). This EUA will remain in effect (meaning this test can be used) for the duration of the COVID-19 declaration under Section 564(b)(1) of the Act, 21 U.S.C. section 360bbb-3(b)(1), unless the authorization is terminated or revoked.  Performed at [facility], [address] [place], [place] [address]   Culture, blood (Routine X 2) w Reflex to ID Panel     Status: Abnormal   Collection Time: 09/26/20  1:40 AM   Specimen: BLOOD  Result Value Ref Range Status   Specimen Description   Final    BLOOD BLOOD LEFT HAND Performed at [facility], [address] [place], [place] [address]    Special Requests   Final    BOTTLES DRAWN AEROBIC ONLY Blood Culture adequate volume Performed at [facility], [address] [place], [place]  [address]    Culture  Setup Time   Final    GRAM NEGATIVE RODS AEROBIC BOTTLE ONLY CRITICAL RESULT CALLED TO, READ BACK BY AND VERIFIED WITH: Sophronia Simas [facility] 0623 09/27/20 A BROWNING Performed at [facility], [address], [place], [place] [address]    Culture (A)  Final    ESCHERICHIA COLI Confirmed Extended Spectrum Beta-Lactamase Producer (ESBL).  In bloodstream infections from ESBL organisms, carbapenems are preferred over piperacillin/tazobactam. They are shown to have a lower risk of mortality.    Report Status 09/29/2020 FINAL  Final   Organism ID, Bacteria ESCHERICHIA COLI  Final       Susceptibility   Escherichia coli - MIC*    AMPICILLIN >=32 RESISTANT Resistant     CEFAZOLIN >=64 RESISTANT Resistant     CEFEPIME >=32 RESISTANT Resistant     CEFTAZIDIME RESISTANT Resistant     CEFTRIAXONE >=64 RESISTANT Resistant     CIPROFLOXACIN >=4 RESISTANT Resistant     GENTAMICIN <=1 SENSITIVE Sensitive     IMIPENEM <=0.25 SENSITIVE Sensitive     TRIMETH/SULFA <=20 SENSITIVE Sensitive     AMPICILLIN/SULBACTAM 16 INTERMEDIATE Intermediate     PIP/TAZO <=4 SENSITIVE Sensitive     * ESCHERICHIA COLI  Blood Culture ID Panel (Reflexed)     Status: Abnormal   Collection Time: 09/26/20  1:40 AM  Result Value Ref Range Status   Enterococcus faecalis NOT DETECTED NOT DETECTED Final   Enterococcus Faecium NOT DETECTED NOT DETECTED Final   Listeria monocytogenes NOT DETECTED NOT DETECTED Final   Staphylococcus species NOT DETECTED NOT DETECTED Final   Staphylococcus aureus (BCID) NOT DETECTED NOT DETECTED Final   Staphylococcus epidermidis NOT DETECTED NOT DETECTED Final   Staphylococcus lugdunensis NOT DETECTED NOT DETECTED Final   Streptococcus species NOT DETECTED NOT DETECTED Final   Streptococcus agalactiae NOT DETECTED NOT DETECTED Final   Streptococcus pneumoniae NOT DETECTED NOT DETECTED Final   Streptococcus pyogenes NOT DETECTED NOT DETECTED Final   A.calcoaceticus-baumannii NOT DETECTED NOT DETECTED Final   Bacteroides fragilis NOT DETECTED NOT DETECTED Final   Enterobacterales DETECTED (A) NOT DETECTED Final    Comment: Enterobacterales represent a large order of gram negative bacteria, not a single organism. CRITICAL RESULT CALLED TO, READ BACK BY AND VERIFIED WITH: Sophronia Simas [facility] [id number] 09/27/20 A BROWNING    Enterobacter cloacae complex NOT DETECTED NOT DETECTED Final   Escherichia coli DETECTED (A) NOT DETECTED Final    Comment: CRITICAL RESULT CALLED TO, READ BACK BY AND VERIFIED WITH: Sophronia Simas PHAR[facility] [id number] 09/27/20 A BROWNING    Klebsiella aerogenes NOT DETECTED NOT  DETECTED Final   Klebsiella oxytoca NOT DETECTED NOT DETECTED Final   Klebsiella pneumoniae NOT DETECTED NOT DETECTED Final   Proteus species NOT DETECTED NOT DETECTED Final   Salmonella species NOT DETECTED NOT DETECTED Final   Serratia marcescens NOT DETECTED NOT DETECTED Final   Haemophilus influenzae NOT DETECTED NOT DETECTED Final   Neisseria meningitidis NOT DETECTED NOT DETECTED Final   Pseudomonas aeruginosa NOT DETECTED NOT DETECTED Final   Stenotrophomonas maltophilia NOT DETECTED NOT DETECTED Final   Candida albicans NOT DETECTED NOT DETECTED Final   Candida auris NOT DETECTED NOT DETECTED Final   Candida glabrata NOT DETECTED NOT DETECTED Final   Candida krusei NOT DETECTED NOT DETECTED Final   Candida parapsilosis NOT DETECTED NOT DETECTED Final   Candida tropicalis NOT DETECTED NOT DETECTED Final   Cryptococcus neoformans/gattii NOT DETECTED NOT DETECTED Final   CTX-M ESBL DETECTED (  A) NOT DETECTED Final    Comment: CRITICAL RESULT CALLED TO, READ BACK BY AND VERIFIED WITH: Sophronia SimasM BELL PHARMD [phone] 09/27/20 A BROWNING (NOTE) Extended spectrum beta-lactamase detected. Recommend a carbapenem as initial therapy.      Carbapenem resistance IMP NOT DETECTED NOT DETECTED Final   Carbapenem resistance KPC NOT DETECTED NOT DETECTED Final   Carbapenem resistance NDM NOT DETECTED NOT DETECTED Final   Carbapenem resist OXA 48 LIKE NOT DETECTED NOT DETECTED Final   Carbapenem resistance VIM NOT DETECTED NOT DETECTED Final    Comment: Performed at [facility], [address] [place], [place] [address]  MRSA PCR Screening     Status: None   Collection Time: 09/26/20  3:30 AM  Result Value Ref Range Status   MRSA by PCR NEGATIVE NEGATIVE Final    Comment:        The GeneXpert MRSA Assay (FDA approved for NASAL specimens only), is one component of a comprehensive MRSA colonization surveillance program. It is not intended to diagnose MRSA infection nor to guide or monitor  treatment for MRSA infections. Performed at [facility], [address] [place], [place] [address]   Culture, blood (routine x 2)     Status: None (Preliminary result)   Collection Time: 09/28/20 10:54 AM   Specimen: BLOOD  Result Value Ref Range Status   Specimen Description   Final    BLOOD RIGHT ANTECUBITAL Performed at [facility], [address] [place], [place] [address]    Special Requests   Final    BOTTLES DRAWN AEROBIC ONLY Blood Culture adequate volume Performed at [facility], [address] [place], [place] [address]    Culture   Final    NO GROWTH 4 DAYS Performed at [facility], [address] [place], [place] [address]    Report Status PENDING  Incomplete  Culture, blood (routine x 2)     Status: None (Preliminary result)   Collection Time: 09/28/20 10:54 AM   Specimen: BLOOD RIGHT HAND  Result Value Ref Range Status   Specimen Description   Final    BLOOD RIGHT HAND Performed at [facility], [address] [place], [place] [address]    Special Requests   Final    BOTTLES DRAWN AEROBIC ONLY Blood Culture results may not be optimal due to an inadequate volume of blood received in culture bottles Performed at [facility], [address] [place], [place] [address]    Culture   Final    NO GROWTH 4 DAYS Performed at [facility], [address] [place], [place] [address]    Report Status PENDING  Incomplete     Radiology Studies: No results found. DG ABD ACUTE 2+V W 1V CHEST  Final Result      Scheduled Meds:  amLODipine  10 mg Oral Daily   feeding supplement  1 Container Oral BID BM   gabapentin  300 mg Oral TID   heparin  5,000 Units Subcutaneous Q8H   insulin aspart  0-5 Units Subcutaneous QHS   insulin aspart  0-9 Units Subcutaneous TID WC   insulin glargine  26 Units Subcutaneous Daily   PRN Meds: acetaminophen, acetaminophen  **OR** [DISCONTINUED] acetaminophen, calcium carbonate (dosed in mg elemental calcium), camphor-menthol **AND** hydrOXYzine, docusate sodium, hydrALAZINE, labetalol, ondansetron **OR** ondansetron (ZOFRAN) IV, oxyCODONE, sorbitol, zolpidem Continuous Infusions:  meropenem (MERREM) IV 1 g (10/02/20 1500)  LOS: 7 days  Time spent: Greater than 50% of the 35 minute visit was spent in counseling/coordination of care for the patient as laid out in the A&P.   Arnetha Courser, MD [facility] 10/02/2020, 4:56 PM

## 2020-10-02 NOTE — Evaluation (Signed)
Occupational Therapy Evaluation Patient Details Name: Shirley Chen MRN: [id number] DOB: 1955-06-19 Today's Date: 10/02/2020    History of Present Illness 65 y.o. female with admitted with sepsis. found to be covid positive also.  PMH: hypertension, diabetes, peripheral vascular disease, tobacco abuse, recent necrotizing fasciitis with left below-knee amputation in May, history of femoral-tibial bypass surgery in the past who was discharged to the skilled facility.   Clinical Impression   Patient from SNF rehab, currently presenting with decreased strength, activity tolerance needing two attempts to power up to standing from edge of bed. First attempt patient R LE giving out before hips extended. Second attempt with bed height elevated and min x2 to power up patient able to maintain static standing ~1 min, unable to hop laterally or forward. Max A to don shoe at edge of bed prior to standing. Would recommend return to rehab, patient reports needs an appointment set up "like now" for prosthesis. Acute OT to follow.     Follow Up Recommendations  SNF    Equipment Recommendations  Other (comment) (defer to next venue)       Precautions / Restrictions Precautions Precautions: Fall      Mobility Bed Mobility               General bed mobility comments: seated edge of bed upon arrival    Transfers Overall transfer level: Needs assistance Equipment used: Rolling walker (2 wheeled) Transfers: Sit to/from Stand Sit to Stand: Min assist;+2 physical assistance;+2 safety/equipment;From elevated surface         General transfer comment: please see toilet transfer in ADL section    Balance Overall balance assessment: Needs assistance Sitting-balance support: Feet supported Sitting balance-Leahy Scale: Good     Standing balance support: Bilateral upper extremity supported Standing balance-Leahy Scale: Poor Standing balance comment: reliant on UE support                            ADL either performed or assessed with clinical judgement   ADL Overall ADL's : Needs assistance/impaired Eating/Feeding: Independent;Sitting   Grooming: Set up;Sitting   Upper Body Bathing: Set up;Sitting   Lower Body Bathing: Moderate assistance;Sitting/lateral leans;Sit to/from stand   Upper Body Dressing : Set up;Sitting   Lower Body Dressing: Maximal assistance;Sitting/lateral leans Lower Body Dressing Details (indicate cue type and reason): to don shoe to R foot Toilet Transfer: Minimal assistance;+2 for physical assistance;+2 for safety/equipment;RW Toilet Transfer Details (indicate cue type and reason): needed two attempts to stand from edge of bed with first attempt at lower bed height and R LE giving out before fully extending at hips having to sit quickly. second attempt patient min x2 to power up to standing from elevated bed height. tolerate standing for ~77min before return to sitting, was unable to hop with walker Toileting- Clothing Manipulation and Hygiene: Maximal assistance;Sitting/lateral lean;Sit to/from stand       Functional mobility during ADLs: Minimal assistance;+2 for physical assistance;+2 for safety/equipment;Cueing for safety;Rolling walker        Pertinent Vitals/Pain Pain Assessment: Faces Faces Pain Scale: Hurts even more Pain Location: R thigh worse in sitting vs standing Pain Descriptors / Indicators: Grimacing;Crying Pain Intervention(s): Monitored during session     Hand Dominance Right   Extremity/Trunk Assessment Upper Extremity Assessment Upper Extremity Assessment: Generalized weakness   Lower Extremity Assessment Lower Extremity Assessment: Defer to PT evaluation       Communication Communication Communication: No difficulties  Cognition Arousal/Alertness: Awake/alert Behavior During Therapy: Flat affect Overall Cognitive Status: Within Functional Limits for tasks assessed                                  General Comments: patient reports she's "about to start freaking out" as the doctor told her she could D/C in two days but still does not have a rehab placement. appears discouraged getting weaker during hospitalization              Home Living Family/patient expects to be discharged to:: Skilled nursing facility                                        Prior Functioning/Environment Level of Independence: Independent with assistive device(s)        Comments: prior to amputation patient independent, at rehab pt reports she was doing transfers, amb up to 14' with RW,  working on LE strengthening and w/c mobility. pt reports she is supposed to be getting her prosthesis very soon        OT Problem List: Decreased strength;Decreased activity tolerance;Impaired balance (sitting and/or standing)      OT Treatment/Interventions: Self-care/ADL training;Balance training;Patient/family education;Therapeutic activities    OT Goals(Current goals can be found in the care plan section) Acute Rehab OT Goals Patient Stated Goal: home after rehab OT Goal Formulation: With patient Time For Goal Achievement: 10/16/20 Potential to Achieve Goals: Good  OT Frequency: Min 2X/week    AM-PAC OT "6 Clicks" Daily Activity     Outcome Measure Help from another person eating meals?: None Help from another person taking care of personal grooming?: A Little Help from another person toileting, which includes using toliet, bedpan, or urinal?: A Lot Help from another person bathing (including washing, rinsing, drying)?: A Lot Help from another person to put on and taking off regular upper body clothing?: A Little Help from another person to put on and taking off regular lower body clothing?: A Lot 6 Click Score: 16   End of Session Equipment Utilized During Treatment: Rolling walker;Gait belt Nurse Communication: Mobility status  Activity Tolerance: Patient limited by  lethargy;Patient tolerated treatment well Patient left: Other (comment);with call bell/phone within reach (seated EOB)  OT Visit Diagnosis: Other abnormalities of gait and mobility (R26.89);Unsteadiness on feet (R26.81);Muscle weakness (generalized) (M62.81)                Time: 8144-8185 OT Time Calculation (min): 17 min Charges:  OT General Charges $OT Visit: 1 Visit OT Evaluation $OT Eval Low Complexity: 1 Low  Marlyce Huge OT OT pager: [phone]  Carmelia Roller 10/02/2020, 12:29 PM

## 2020-10-03 LAB — CULTURE, BLOOD (ROUTINE X 2)
Culture: NO GROWTH
Culture: NO GROWTH
Special Requests: ADEQUATE

## 2020-10-03 LAB — GLUCOSE, CAPILLARY
Glucose-Capillary: 129 mg/dL — ABNORMAL HIGH (ref 70–99)
Glucose-Capillary: 201 mg/dL — ABNORMAL HIGH (ref 70–99)
Glucose-Capillary: 165 mg/dL — ABNORMAL HIGH (ref 70–99)

## 2020-10-03 MED ORDER — BOOST / RESOURCE BREEZE PO LIQD CUSTOM: 1.0000 | ORAL | Status: DC

## 2020-10-03 MED ORDER — GLUCERNA SHAKE PO LIQD
237.0000 mL | Freq: Two times a day (BID) | ORAL | Status: DC
  Filled 2020-10-03 (×3): qty 237

## 2020-10-03 NOTE — Progress Notes (Signed)
Physical Therapy Treatment Patient Details Name: Annaliz Aven MRN: [id number] DOB: [birth year] Today's Date: 10/03/2020    History of Present Illness 65 y.o. female with admitted with sepsis. found to be covid positive also.  PMH: hypertension, diabetes, peripheral vascular disease, tobacco abuse, recent necrotizing fasciitis with left below-knee amputation in May, history of femoral-tibial bypass surgery in the past who was discharged to the skilled facility.    PT Comments    Pt is weaker than last session. Heavy Mod A to stand with use of STEDY x 4 on today to work on strengthening and activity tolerance. Pt tearful and discouraged about current condition and d/c plan. She does not wish to return to same SNF-"they made me sick". She stated she has not spoken with a SW about this yet. Encouraged pt to try to stay focused on rehab goals and to not give up. She seemed to be making good progress at previous SNF and was working on getting her prosthesis. Will continue to follow and progress activity as tolerated.    Follow Up Recommendations  SNF     Equipment Recommendations   (TBD at next venue)    Recommendations for Other Services       Precautions / Restrictions Precautions Precautions: Fall Precaution Comments: L BKA Restrictions Weight Bearing Restrictions: No    Mobility  Bed Mobility Overal bed mobility: Needs Assistance Bed Mobility: Supine to Sit     Supine to sit: Min guard;HOB elevated     General bed mobility comments: Min guard for safety.    Transfers Overall transfer level: Needs assistance   Transfers: Sit to/from Stand Sit to Stand: Mod assist;From elevated surface         General transfer comment: Sit to stand x 4 for strengthening, activity tolerance. Pt stood for ~ 1 minute each time. Assist to power up, control descent. Cues for safety, technique. Used STEDY to transfer to recliner  Ambulation/Gait             General Gait Details: NT  on today-pt unable-weaker   Stairs             Wheelchair Mobility    Modified Rankin (Stroke Patients Only)       Balance Overall balance assessment: Needs assistance         Standing balance support: Bilateral upper extremity supported Standing balance-Leahy Scale: Poor                              Cognition Arousal/Alertness: Awake/alert Behavior During Therapy: Flat affect Overall Cognitive Status: Within Functional Limits for tasks assessed                                 General Comments: teaful, discouraged about being weaker, placement options, etc      Exercises General Exercises - Lower Extremity Long Arc Quad: AROM;Both;15 reps;Seated Hip Flexion/Marching: AROM;Both;15 reps;Seated    General Comments        Pertinent Vitals/Pain Pain Assessment: 0-10 Faces Pain Scale: Hurts even more Pain Location: R ankle with standing Pain Descriptors / Indicators: Discomfort;Grimacing Pain Intervention(s): Limited activity within patient's tolerance;Monitored during session;Repositioned    Home Living                      Prior Function            PT Goals (  current goals can now be found in the care plan section) Progress towards PT goals: Progressing toward goals    Frequency    Min 3X/week      PT Plan Current plan remains appropriate    Co-evaluation              AM-PAC PT "6 Clicks" Mobility   Outcome Measure  Help needed turning from your back to your side while in a flat bed without using bedrails?: A Little Help needed moving from lying on your back to sitting on the side of a flat bed without using bedrails?: A Little Help needed moving to and from a bed to a chair (including a wheelchair)?: Total Help needed standing up from a chair using your arms (e.g., wheelchair or bedside chair)?: A Lot Help needed to walk in hospital room?: Total Help needed climbing 3-5 steps with a railing? :  Total 6 Click Score: 11    End of Session   Activity Tolerance: Patient limited by fatigue;Patient limited by pain Patient left: in chair;with call bell/phone within reach   PT Visit Diagnosis: Muscle weakness (generalized) (M62.81);Other abnormalities of gait and mobility (R26.89)     Time: 0100-7121 PT Time Calculation (min) (ACUTE ONLY): 32 min  Charges:  $Therapeutic Activity: 23-37 mins                         Faye Ramsay, PT Acute Rehabilitation  Office: [phone] Pager: [phone]

## 2020-10-03 NOTE — Progress Notes (Signed)
Nutrition Follow-up  DOCUMENTATION CODES:   Obesity unspecified  INTERVENTION:  - will decrease Boost Breeze from BID to once/day, each supplement provides 250 kcal and 9 grams of protein. - will order Glucerna Shake BID, each supplement provides 220 kcal and 10 grams of protein.   NUTRITION DIAGNOSIS:   Increased nutrient needs related to catabolic illness (COVID) as evidenced by estimated needs. -ongoing  GOAL:   Patient will meet greater than or equal to 90% of their needs -minimally met on average   MONITOR:   PO intake, Supplement acceptance, Labs, Weight trends  ASSESSMENT:   65 y.o. female with medical history significant of hypertension, diabetes, peripheral vascular disease, tobacco abuse, recent necrotizing fasciitis with left below-knee amputation in May, history of femoral-tibial bypass surgery. Presents with n/v. Pt with AKI, COVID positive.  She has been eating mainly 50% over the past 1 week (50-100%). She has been accepting Boost Breeze ~50-75% of the time offered.   Weight has been stable since admission on 6/21. Non-pitting edema to RLE documented in the edema flow sheet on 6/27.  Per notes, patient is from SNF with plan to return to SNF at the time of d/c.     Labs reviewed; CBGs: 129 mg/dl. Medications reviewed; sliding scale novolog, 26 units lantus/day.     Diet Order:   Diet Order             Diet Carb Modified Fluid consistency: Thin; Room service appropriate? Yes  Diet effective now                   EDUCATION NEEDS:   No education needs have been identified at this time  Skin:  Skin Assessment: Reviewed RN Assessment  Last BM:  6/24  Height:   Ht Readings from Last 1 Encounters:  09/25/20 '5\' 4"'  (1.626 m)    Weight:   Wt Readings from Last 1 Encounters:  10/02/20 89.9 kg     Estimated Nutritional Needs:  Kcal:  2000-2200 Protein:  100-110 grams Fluid:  1.2 L/day      Jarome Matin, MS, RD, LDN,  CNSC Inpatient Clinical Dietitian RD pager # available in [facility]  After hours/weekend pager # available in [facility]

## 2020-10-03 NOTE — TOC Progression Note (Signed)
Transition of Care [facility]) - Progression Note    Patient Details  Name: Shirley Chen MRN: [id number] Date of Birth: 07/26/1955  Transition [facility]) CM/SW Contact  Geni Bers, RN Phone Number: 10/03/2020, 2:37 PM  Clinical Narrative:    Authorization was started with [facility].    Expected Discharge Plan: Skilled Nursing Facility Barriers to Discharge: SNF Pending bed offer  Expected Discharge Plan and Services Expected Discharge Plan: Skilled Nursing Facility     Post Acute Care Choice: Home Health Living arrangements for the past 2 months: Skilled Nursing Facility                                       Social Determinants of Health (SDOH) Interventions    Readmission Risk Interventions No flowsheet data found.

## 2020-10-03 NOTE — Progress Notes (Signed)
Progress Note    Shirley Chen   VWU:[id number]  DOB: July 20, 1955  DOA: 09/25/2020     8  PCP: Olive Bass, FNP  CC: weakness, N/V  Hospital Course: Shirley Chen is a 65 y.o. female with medical history significant of hypertension, diabetes, peripheral vascular disease, tobacco abuse, recent necrotizing fasciitis with left below-knee amputation in May, history of femoral-tibial bypass surgery in the past who was discharged to the skilled facility where she resides.   She presented to the hospital with nausea/vomiting. On work-up she was found to be febrile, 105 degrees, lethargic.  There was concern for underlying sepsis and she was started on vancomycin and cefepime for empiric coverage.  COVID-19 testing also came back positive and she had been placed on oxygen for low normal saturations. Further work-up revealed AKI and she was started on fluids as well.  Patient stable, will complete antibiotics tomorrow and go back to her facility.  Interval History:  Patient was seen and examined today.  No new complaints.  She was asking about getting fitting done for prosthesis and I told her that this is an outpatient procedure and she should contact her orthopedic surgeon for further recommendations. She was LE  Assessment & Plan:  Old records reviewed in assessment of this patient  Gram-negative bacteremia with ESBL E. coli.  Procalcitonin improving.  Remained afebrile. -Continue with meropenem-we will complete course on 10/04/2020.  COVID-19 infection.  Initially requiring oxygen, now on room air. Completed a course of remdesivir. -Continue with supplement and supportive care  AKI with CKD stage IIIb.  AKI resolved. Creatinine stable and at baseline. -Continue to monitor renal function -Avoid nephrotoxins.  Diabetes mellitus with peripheral vascular disease.  Uncontrolled with A1c of 8 checked in March 2022.  S/p left BKA on 08/08/2020.  Stump look okay. -Continue with  SSI  Hypertension.  Blood pressure within goal. -Continue with current management  Tobacco abuse. -Continue with nicotine patch  Antimicrobials: Cefepime 09/26/2020 >> 09/27/2020 Vancomycin 09/26/2020 >> 09/27/2020 Meropenem 09/27/2020 >> current  DVT prophylaxis: heparin injection 5,000 Units Start: 09/25/20 2315   Code Status:   Code Status: Full Code Family Communication:   Disposition Plan: Status is: Inpatient  Remains inpatient appropriate because:Unsafe d/c plan, IV treatments appropriate due to intensity of illness or inability to take PO, and Inpatient level of care appropriate due to severity of illness  Dispo: The patient is from: SNF              Anticipated d/c is to: SNF              Patient currently is not medically stable to d/c.   Difficult to place patient No  Risk of unplanned readmission score: Unplanned Admission- Pilot do not use: 22.84   Objective: Blood pressure 134/71, pulse 70, temperature 97.8 F (36.6 C), temperature source Oral, resp. rate 18, height  (1.626 m), weight 89.9 kg, SpO2 98 %.  Examination:  General.  Well-developed lady, in no acute distress. Pulmonary.  Lungs clear bilaterally, normal respiratory effort. CV.  Regular rate and rhythm, no JVD, rub or murmur. Abdomen.  Soft, nontender, nondistended, BS positive. CNS.  Alert and oriented .  No focal neurologic deficit. Extremities.  Left BKA, right first ray amputation.  No Psychiatry.  Judgment and insight appears normal.   Consultants:    Procedures:    Data Reviewed: I have personally reviewed following labs and imaging studies Results for orders placed or performed during the hospital encounter  of 09/25/20 (from the past 24 hour(s))  Glucose, capillary     Status: Abnormal   Collection Time: 10/02/20  8:52 PM  Result Value Ref Range   Glucose-Capillary 200 (H) 70 - 99 mg/dL  Glucose, capillary     Status: Abnormal   Collection Time: 10/03/20  8:27 AM  Result Value  Ref Range   Glucose-Capillary 129 (H) 70 - 99 mg/dL  Glucose, capillary     Status: Abnormal   Collection Time: 10/03/20 12:37 PM  Result Value Ref Range   Glucose-Capillary 201 (H) 70 - 99 mg/dL  Glucose, capillary     Status: Abnormal   Collection Time: 10/03/20  4:00 PM  Result Value Ref Range   Glucose-Capillary 165 (H) 70 - 99 mg/dL    Recent Results (from the past 240 hour(s))  Resp Panel by RT-PCR (Flu A&B, Covid) Nasopharyngeal Swab     Status: Abnormal   Collection Time: 09/25/20  7:46 PM   Specimen: Nasopharyngeal Swab; Nasopharyngeal(NP) swabs in vial transport medium  Result Value Ref Range Status   SARS Coronavirus 2 by RT PCR POSITIVE (A) NEGATIVE Final    Comment: RESULT CALLED TO, READ BACK BY AND VERIFIED WITH: J LOUDERMILK AT 2050 ON 09/25/2020 BY MOSLEY,J (NOTE) SARS-CoV-2 target nucleic acids are DETECTED.  The SARS-CoV-2 RNA is generally detectable in upper respiratory specimens during the acute phase of infection. Positive results are indicative of the presence of the identified virus, but do not rule out bacterial infection or co-infection with other pathogens not detected by the test. Clinical correlation with patient history and other diagnostic information is necessary to determine patient infection status. The expected result is Negative.  Fact Sheet for Patients: BloggerCourse.com  Fact Sheet for Healthcare Providers: SeriousBroker.it  This test is not yet approved or cleared by the [place] FDA and  has been authorized for detection and/or diagnosis of SARS-CoV-2 by FDA under an Emergency Use Authorization (EUA).  This EUA will remain in effect (meaning this  test can be used) for the duration of  the COVID-19 declaration under Section 564(b)(1) of the Act, 21 U.S.C. section 360bbb-3(b)(1), unless the authorization is terminated or revoked sooner.     Influenza A by PCR NEGATIVE NEGATIVE  Final   Influenza B by PCR NEGATIVE NEGATIVE Final    Comment: (NOTE) The Xpert Xpress SARS-CoV-2/FLU/RSV plus assay is intended as an aid in the diagnosis of influenza from Nasopharyngeal swab specimens and should not be used as a sole basis for treatment. Nasal washings and aspirates are unacceptable for Xpert Xpress SARS-CoV-2/FLU/RSV testing.  Fact Sheet for Patients: BloggerCourse.com  Fact Sheet for Healthcare Providers: SeriousBroker.it  This test is not yet approved or cleared by the [place] FDA and has been authorized for detection and/or diagnosis of SARS-CoV-2 by FDA under an Emergency Use Authorization (EUA). This EUA will remain in effect (meaning this test can be used) for the duration of the COVID-19 declaration under Section 564(b)(1) of the Act, 21 U.S.C. section 360bbb-3(b)(1), unless the authorization is terminated or revoked.  Performed at [facility], [address], [place], [place] [address]   Culture, blood (Routine X 2) w Reflex to ID Panel     Status: Abnormal   Collection Time: 09/26/20  1:40 AM   Specimen: BLOOD  Result Value Ref Range Status   Specimen Description   Final    BLOOD BLOOD LEFT HAND Performed at [facility], [address] [place], [place] [address]  Special Requests   Final    BOTTLES DRAWN AEROBIC ONLY Blood Culture adequate volume Performed at [facility], [address] [place], [place] [address]    Culture  Setup Time   Final    GRAM NEGATIVE RODS AEROBIC BOTTLE ONLY CRITICAL RESULT CALLED TO, READ BACK BY AND VERIFIED WITH: Sophronia SimasM BELL [facility] [id number] 09/27/20 A BROWNING Performed at [facility], [address] [place], [place] [address]    Culture (A)  Final    ESCHERICHIA COLI Confirmed Extended Spectrum Beta-Lactamase Producer (ESBL).  In bloodstream infections from ESBL organisms, carbapenems are  preferred over piperacillin/tazobactam. They are shown to have a lower risk of mortality.    Report Status 09/29/2020 FINAL  Final   Organism ID, Bacteria ESCHERICHIA COLI  Final      Susceptibility   Escherichia coli - MIC*    AMPICILLIN >=32 RESISTANT Resistant     CEFAZOLIN >=64 RESISTANT Resistant     CEFEPIME >=32 RESISTANT Resistant     CEFTAZIDIME RESISTANT Resistant     CEFTRIAXONE >=64 RESISTANT Resistant     CIPROFLOXACIN >=4 RESISTANT Resistant     GENTAMICIN <=1 SENSITIVE Sensitive     IMIPENEM <=0.25 SENSITIVE Sensitive     TRIMETH/SULFA <=20 SENSITIVE Sensitive     AMPICILLIN/SULBACTAM 16 INTERMEDIATE Intermediate     PIP/TAZO <=4 SENSITIVE Sensitive     * ESCHERICHIA COLI  Blood Culture ID Panel (Reflexed)     Status: Abnormal   Collection Time: 09/26/20  1:40 AM  Result Value Ref Range Status   Enterococcus faecalis NOT DETECTED NOT DETECTED Final   Enterococcus Faecium NOT DETECTED NOT DETECTED Final   Listeria monocytogenes NOT DETECTED NOT DETECTED Final   Staphylococcus species NOT DETECTED NOT DETECTED Final   Staphylococcus aureus (BCID) NOT DETECTED NOT DETECTED Final   Staphylococcus epidermidis NOT DETECTED NOT DETECTED Final   Staphylococcus lugdunensis NOT DETECTED NOT DETECTED Final   Streptococcus species NOT DETECTED NOT DETECTED Final   Streptococcus agalactiae NOT DETECTED NOT DETECTED Final   Streptococcus pneumoniae NOT DETECTED NOT DETECTED Final   Streptococcus pyogenes NOT DETECTED NOT DETECTED Final   A.calcoaceticus-baumannii NOT DETECTED NOT DETECTED Final   Bacteroides fragilis NOT DETECTED NOT DETECTED Final   Enterobacterales DETECTED (A) NOT DETECTED Final    Comment: Enterobacterales represent a large order of gram negative bacteria, not a single organism. CRITICAL RESULT CALLED TO, READ BACK BY AND VERIFIED WITH: Sophronia SimasM BELL PHARMD [id number] 09/27/20 A BROWNING    Enterobacter cloacae complex NOT DETECTED NOT DETECTED Final   Escherichia coli  DETECTED (A) NOT DETECTED Final    Comment: CRITICAL RESULT CALLED TO, READ BACK BY AND VERIFIED WITH: Sophronia SimasM BELL PHARMD [id number] 09/27/20 A BROWNING    Klebsiella aerogenes NOT DETECTED NOT DETECTED Final   Klebsiella oxytoca NOT DETECTED NOT DETECTED Final   Klebsiella pneumoniae NOT DETECTED NOT DETECTED Final   Proteus species NOT DETECTED NOT DETECTED Final   Salmonella species NOT DETECTED NOT DETECTED Final   Serratia marcescens NOT DETECTED NOT DETECTED Final   Haemophilus influenzae NOT DETECTED NOT DETECTED Final   Neisseria meningitidis NOT DETECTED NOT DETECTED Final   Pseudomonas aeruginosa NOT DETECTED NOT DETECTED Final   Stenotrophomonas maltophilia NOT DETECTED NOT DETECTED Final   Candida albicans NOT DETECTED NOT DETECTED Final   Candida auris NOT DETECTED NOT DETECTED Final   Candida glabrata NOT DETECTED NOT DETECTED Final   Candida krusei NOT DETECTED NOT DETECTED Final   Candida parapsilosis  NOT DETECTED NOT DETECTED Final   Candida tropicalis NOT DETECTED NOT DETECTED Final   Cryptococcus neoformans/gattii NOT DETECTED NOT DETECTED Final   CTX-M ESBL DETECTED (A) NOT DETECTED Final    Comment: CRITICAL RESULT CALLED TO, READ BACK BY AND VERIFIED WITH: Sophronia Simas PHARMD [id number] 09/27/20 A BROWNING (NOTE) Extended spectrum beta-lactamase detected. Recommend a carbapenem as initial therapy.      Carbapenem resistance IMP NOT DETECTED NOT DETECTED Final   Carbapenem resistance KPC NOT DETECTED NOT DETECTED Final   Carbapenem resistance NDM NOT DETECTED NOT DETECTED Final   Carbapenem resist OXA 48 LIKE NOT DETECTED NOT DETECTED Final   Carbapenem resistance VIM NOT DETECTED NOT DETECTED Final    Comment: Performed at [facility], [address] [place], [place] [address]  MRSA PCR Screening     Status: None   Collection Time: 09/26/20  3:30 AM  Result Value Ref Range Status   MRSA by PCR NEGATIVE NEGATIVE Final    Comment:        The GeneXpert MRSA Assay  (FDA approved for NASAL specimens only), is one component of a comprehensive MRSA colonization surveillance program. It is not intended to diagnose MRSA infection nor to guide or monitor treatment for MRSA infections. Performed at [facility], [address], [place], [place] [address]   Culture, blood (routine x 2)     Status: None   Collection Time: 09/28/20 10:54 AM   Specimen: BLOOD  Result Value Ref Range Status   Specimen Description   Final    BLOOD RIGHT ANTECUBITAL Performed at [facility], [address] [place], [place] [address]    Special Requests   Final    BOTTLES DRAWN AEROBIC ONLY Blood Culture adequate volume Performed at [facility], [address], [place], [place] [address]    Culture   Final    NO GROWTH 5 DAYS Performed at [facility], [address], [place], [place] [address]    Report Status 10/03/2020 FINAL  Final  Culture, blood (routine x 2)     Status: None   Collection Time: 09/28/20 10:54 AM   Specimen: BLOOD RIGHT HAND  Result Value Ref Range Status   Specimen Description   Final    BLOOD RIGHT HAND Performed at [facility], [address], [place], [place] [address]    Special Requests   Final    BOTTLES DRAWN AEROBIC ONLY Blood Culture results may not be optimal due to an inadequate volume of blood received in culture bottles Performed at [facility], [address], [place], [place] [address]    Culture   Final    NO GROWTH 5 DAYS Performed at [facility], [address] [place], [place] [address]    Report Status 10/03/2020 FINAL  Final     Radiology Studies: No results found. DG ABD ACUTE 2+V W 1V CHEST  Final Result      Scheduled Meds:  amLODipine  10 mg Oral Daily   feeding supplement  1 Container Oral Q24H   feeding supplement (GLUCERNA SHAKE)  237 mL Oral BID BM   gabapentin  300 mg Oral TID   heparin   5,000 Units Subcutaneous Q8H   insulin aspart  0-5 Units Subcutaneous QHS   insulin aspart  0-9 Units Subcutaneous TID WC   insulin glargine  26 Units Subcutaneous Daily   PRN Meds: acetaminophen, acetaminophen **OR** [DISCONTINUED]  acetaminophen, calcium carbonate (dosed in mg elemental calcium), camphor-menthol **AND** hydrOXYzine, docusate sodium, hydrALAZINE, labetalol, ondansetron **OR** ondansetron (ZOFRAN) IV, oxyCODONE, sorbitol, zolpidem Continuous Infusions:  meropenem (MERREM) IV 1 g (10/03/20 1300)     LOS: 8 days  Time spent: Greater than 50% of the 32 minute visit was spent in counseling/coordination of care for the patient as laid out in the A&P.   Arnetha Courser, MD [facility] 10/03/2020, 5:11 PM

## 2020-10-03 NOTE — Progress Notes (Signed)
Pharmacy Antibiotic Note  Shirley Chen is a 65 y.o. female with hx necrotizing fasciitis with left below-knee amputation in May and recent COVID infection presented to the ED from skilled nursing facility on 09/25/2020 with c/o n/v.  She was started on vancomycin and cefepime on admission for sepsis. Changed to Meropenem on 6/23 for +ESBL bacterermia. Plan is to treat for 7 days (thru 6/30)  Today, 10/03/2020: - Day #6/7 Meropenem - Afeb - SCr improved to 0.9 on 6/27 (crcl~68)  Plan: Continue Meropenem 1g IV q8h (stop date entered for 6/30)  ____________________________________________ Height: 5\' 4"  (162.6 cm) Weight: 89.9 kg (198 lb 3.1 oz) IBW/kg (Calculated) : 54.7  Temp (24hrs), Avg:98 F (36.7 C), Min:97.8 F (36.6 C), Max:98.1 F (36.7 C)  Recent Labs  Lab 09/27/20 0414 09/28/20 0335 09/29/20 0434 09/30/20 0342 10/01/20 0333  WBC 12.7* 13.5* 8.2 10.3 10.3  CREATININE 1.64* 1.07* 0.99 0.90 0.90     Estimated Creatinine Clearance: 68.6 mL/min (by C-G formula based on SCr of 0.9 mg/dL).    Allergies  Allergen Reactions   Losartan Nausea And Vomiting   Morphine And Related     Pt states " I just got really sick"   Antimicrobials this admission:  6/22 Vanc >> 6/23 6/22 Cefepime >>6/23 6/23 Meropenem>> 6/22 Remdesivir >> 6/26   Microbiology results:  6/21 BCx x1(only aerobic bottle drawn):  ESBL E Coli (S imipenem) 6/22 Covid: positive 6/22 MRSA PCR: neg 6/24 BCx x2: neg FINAL  Thank you for allowing pharmacy to be a part of this patient's care.  7/24, PharmD, BCPS 10/03/2020 10:13 AM

## 2020-10-03 NOTE — TOC Progression Note (Addendum)
Transition of Care [facility]) - Progression Note    Patient Details  Name: Shirley Chen MRN: [id number] Date of Birth: [birth year]  Transition of Care [facility]) CM/SW Contact  Geni Bers, RN Phone Number: 10/03/2020, 4:37 PM  Clinical Narrative:    Pt was approved to go to [facility] for SNF by insurance. [id number], ASTM [id number] 6/30-7/5.    Expected Discharge Plan: Skilled Nursing Facility Barriers to Discharge: SNF Pending bed offer  Expected Discharge Plan and Services Expected Discharge Plan: Skilled Nursing Facility     Post Acute Care Choice: Home Health Living arrangements for the past 2 months: Skilled Nursing Facility                                       Social Determinants of Health (SDOH) Interventions    Readmission Risk Interventions No flowsheet data found.

## 2020-10-04 DIAGNOSIS — R6520 Severe sepsis without septic shock: Secondary | ICD-10-CM | POA: Diagnosis not present

## 2020-10-04 DIAGNOSIS — R262 Difficulty in walking, not elsewhere classified: Secondary | ICD-10-CM | POA: Diagnosis not present

## 2020-10-04 DIAGNOSIS — D649 Anemia, unspecified: Secondary | ICD-10-CM | POA: Diagnosis not present

## 2020-10-04 DIAGNOSIS — Z89512 Acquired absence of left leg below knee: Secondary | ICD-10-CM | POA: Diagnosis not present

## 2020-10-04 DIAGNOSIS — I739 Peripheral vascular disease, unspecified: Secondary | ICD-10-CM | POA: Diagnosis not present

## 2020-10-04 DIAGNOSIS — L03116 Cellulitis of left lower limb: Secondary | ICD-10-CM | POA: Diagnosis not present

## 2020-10-04 DIAGNOSIS — R5381 Other malaise: Secondary | ICD-10-CM | POA: Diagnosis not present

## 2020-10-04 DIAGNOSIS — M109 Gout, unspecified: Secondary | ICD-10-CM | POA: Diagnosis not present

## 2020-10-04 DIAGNOSIS — E11628 Type 2 diabetes mellitus with other skin complications: Secondary | ICD-10-CM | POA: Diagnosis not present

## 2020-10-04 DIAGNOSIS — W19XXXA Unspecified fall, initial encounter: Secondary | ICD-10-CM | POA: Diagnosis not present

## 2020-10-04 DIAGNOSIS — E1159 Type 2 diabetes mellitus with other circulatory complications: Secondary | ICD-10-CM | POA: Diagnosis not present

## 2020-10-04 DIAGNOSIS — M726 Necrotizing fasciitis: Secondary | ICD-10-CM | POA: Diagnosis not present

## 2020-10-04 DIAGNOSIS — E876 Hypokalemia: Secondary | ICD-10-CM | POA: Diagnosis not present

## 2020-10-04 DIAGNOSIS — N1832 Chronic kidney disease, stage 3b: Secondary | ICD-10-CM | POA: Diagnosis not present

## 2020-10-04 DIAGNOSIS — J9601 Acute respiratory failure with hypoxia: Secondary | ICD-10-CM | POA: Diagnosis not present

## 2020-10-04 DIAGNOSIS — Z720 Tobacco use: Secondary | ICD-10-CM | POA: Diagnosis not present

## 2020-10-04 DIAGNOSIS — E785 Hyperlipidemia, unspecified: Secondary | ICD-10-CM | POA: Diagnosis not present

## 2020-10-04 DIAGNOSIS — Z8616 Personal history of COVID-19: Secondary | ICD-10-CM | POA: Diagnosis not present

## 2020-10-04 DIAGNOSIS — R7881 Bacteremia: Secondary | ICD-10-CM | POA: Diagnosis not present

## 2020-10-04 DIAGNOSIS — M79662 Pain in left lower leg: Secondary | ICD-10-CM | POA: Diagnosis not present

## 2020-10-04 DIAGNOSIS — R600 Localized edema: Secondary | ICD-10-CM | POA: Diagnosis not present

## 2020-10-04 DIAGNOSIS — E875 Hyperkalemia: Secondary | ICD-10-CM | POA: Diagnosis not present

## 2020-10-04 DIAGNOSIS — M6281 Muscle weakness (generalized): Secondary | ICD-10-CM | POA: Diagnosis not present

## 2020-10-04 DIAGNOSIS — A419 Sepsis, unspecified organism: Secondary | ICD-10-CM | POA: Diagnosis not present

## 2020-10-04 DIAGNOSIS — I10 Essential (primary) hypertension: Secondary | ICD-10-CM | POA: Diagnosis not present

## 2020-10-04 DIAGNOSIS — N179 Acute kidney failure, unspecified: Secondary | ICD-10-CM | POA: Diagnosis not present

## 2020-10-04 DIAGNOSIS — S81802D Unspecified open wound, left lower leg, subsequent encounter: Secondary | ICD-10-CM | POA: Diagnosis not present

## 2020-10-04 DIAGNOSIS — M79661 Pain in right lower leg: Secondary | ICD-10-CM | POA: Diagnosis not present

## 2020-10-04 DIAGNOSIS — Z4781 Encounter for orthopedic aftercare following surgical amputation: Secondary | ICD-10-CM | POA: Diagnosis not present

## 2020-10-04 DIAGNOSIS — I872 Venous insufficiency (chronic) (peripheral): Secondary | ICD-10-CM | POA: Diagnosis not present

## 2020-10-04 DIAGNOSIS — S81802A Unspecified open wound, left lower leg, initial encounter: Secondary | ICD-10-CM | POA: Diagnosis not present

## 2020-10-04 DIAGNOSIS — E1151 Type 2 diabetes mellitus with diabetic peripheral angiopathy without gangrene: Secondary | ICD-10-CM | POA: Diagnosis not present

## 2020-10-04 DIAGNOSIS — Z1612 Extended spectrum beta lactamase (ESBL) resistance: Secondary | ICD-10-CM | POA: Diagnosis not present

## 2020-10-04 DIAGNOSIS — Z89411 Acquired absence of right great toe: Secondary | ICD-10-CM | POA: Diagnosis not present

## 2020-10-04 DIAGNOSIS — U099 Post covid-19 condition, unspecified: Secondary | ICD-10-CM | POA: Diagnosis not present

## 2020-10-04 DIAGNOSIS — Z743 Need for continuous supervision: Secondary | ICD-10-CM | POA: Diagnosis not present

## 2020-10-04 LAB — GLUCOSE, CAPILLARY: Glucose-Capillary: 151 mg/dL — ABNORMAL HIGH (ref 70–99)

## 2020-10-04 MED ORDER — GABAPENTIN 300 MG PO CAPS: 300.0000 mg | ORAL_CAPSULE | Freq: Three times a day (TID) | ORAL | Status: DC

## 2020-10-04 MED ORDER — CALCIUM CARBONATE ANTACID 1250 MG/5ML PO SUSP: 500.0000 mg | Freq: Four times a day (QID) | ORAL | Status: DC | PRN

## 2020-10-04 MED ORDER — GLUCERNA SHAKE PO LIQD: 237.0000 mL | Freq: Two times a day (BID) | ORAL | 0 refills | Status: DC

## 2020-10-04 NOTE — Care Management Important Message (Signed)
Important Message  Patient Details IM Letter placed in Patient's door caddy. Name: Allisha Harter MRN: [id number] Date of Birth: 11/08/1955   Medicare Important Message Given:  Yes     Caren Macadam 10/04/2020, 10:43 AM

## 2020-10-04 NOTE — Progress Notes (Signed)
Occupational Therapy Treatment Patient Details Name: Shirley Chen MRN: [id number] DOB: [birth year] Today's Date: 10/04/2020    History of present illness 65 y.o. female with admitted with sepsis. found to be covid positive also.  PMH: hypertension, diabetes, peripheral vascular disease, tobacco abuse, recent necrotizing fasciitis with left below-knee amputation in May, history of femoral-tibial bypass surgery in the past who was discharged to the skilled facility.   OT comments  Treatment focused on dressing and grooming tasks using compensatory strategies as well as functional mobility. Patient able to don underwear and pants using bridging in supine and lateral leans and min assist from therapist for threading right foot through pants. Patient able to stand with RW with min assist and scoot/slide foot to move towards head of bed. Only able to perform one "hop" with walker as patient reports decreased strength. Continue POC.   Follow Up Recommendations  SNF    Equipment Recommendations  Other (comment) (defer to next venue)    Recommendations for Other Services      Precautions / Restrictions Precautions Precautions: Fall Precaution Comments: L BKA Restrictions Weight Bearing Restrictions: No       Mobility Bed Mobility Overal bed mobility: Modified Independent;Needs Assistance Bed Mobility: Supine to Sit     Supine to sit: Supervision     General bed mobility comments: supervision for safety - use of bed rails needed.    Transfers Overall transfer level: Needs assistance Equipment used: Rolling walker (2 wheeled) Transfers: Sit to/from Stand Sit to Stand: Min assist         General transfer comment: Sit to stand and scooting/sliding foot to move toward head of bed with RW. Min assist from therapist. Patient only able to perform one "hop" with walker. Reports generalized weakness.    Balance Overall balance assessment: Needs assistance Sitting-balance support:  No upper extremity supported Sitting balance-Leahy Scale: Good     Standing balance support: Bilateral upper extremity supported Standing balance-Leahy Scale: Poor Standing balance comment: reliant on UE support                           ADL either performed or assessed with clinical judgement   ADL Overall ADL's : Needs assistance/impaired     Grooming: Set up;Sitting;Brushing hair;Wash/dry face;Wash/dry hands Grooming Details (indicate cue type and reason): wased face, hands sitting at side of bed. Brushed hair at side of bed.             Lower Body Dressing: Minimal assistance;Sitting/lateral leans;Bed level Lower Body Dressing Details (indicate cue type and reason): Patient able to don underwear and pants using leaning technique and returning to supine position for bridging with min assist from therapist (to thread over right foot).             Functional mobility during ADLs: Minimal assistance;Rolling walker General ADL Comments: Min assist to stand with RW and to slide/scoot foot towards head of bed. ABle to perform one "hop" only. Patient reports she is weaker than she had been at rehab.     Vision Patient Visual Report: No change from baseline     Perception     Praxis      Cognition Arousal/Alertness: Awake/alert Behavior During Therapy: Flat affect Overall Cognitive Status: Within Functional Limits for tasks assessed  General Comments: Flat affect. Discouraged about decline in abilities and having to return to facility.        Exercises     Shoulder Instructions       General Comments      Pertinent Vitals/ Pain       Pain Assessment: Faces Pain Score: 4  Pain Location: R ankle with standing Pain Descriptors / Indicators: Discomfort;Grimacing Pain Intervention(s): Limited activity within patient's tolerance;Monitored during session;RN gave pain meds during session  Home Living                                           Prior Functioning/Environment              Frequency  Min 2X/week        Progress Toward Goals  OT Goals(current goals can now be found in the care plan section)  Progress towards OT goals: Progressing toward goals  Acute Rehab OT Goals Patient Stated Goal: to regain strength OT Goal Formulation: With patient Time For Goal Achievement: 10/16/20 Potential to Achieve Goals: Good  Plan Discharge plan remains appropriate    Co-evaluation                 AM-PAC OT "6 Clicks" Daily Activity     Outcome Measure   Help from another person eating meals?: None Help from another person taking care of personal grooming?: A Little Help from another person toileting, which includes using toliet, bedpan, or urinal?: A Lot Help from another person bathing (including washing, rinsing, drying)?: A Lot Help from another person to put on and taking off regular upper body clothing?: A Little Help from another person to put on and taking off regular lower body clothing?: A Little (at seated/bed level) 6 Click Score: 17    End of Session Equipment Utilized During Treatment: Rolling walker;Gait belt  OT Visit Diagnosis: Other abnormalities of gait and mobility (R26.89);Unsteadiness on feet (R26.81);Muscle weakness (generalized) (M62.81)   Activity Tolerance Patient tolerated treatment well   Patient Left in bed;with call bell/phone within reach;with nursing/sitter in room;with bed alarm set   Nurse Communication Mobility status        Time: 1030-1046 OT Time Calculation (min): 16 min  Charges: OT General Charges $OT Visit: 1 Visit OT Treatments $Self Care/Home Management : 8-22 mins  Waldron Session, OTR/L Acute Care Rehab Services  Office [phone] Pager: [phone]    Kelli Churn 10/04/2020, 11:17 AM

## 2020-10-04 NOTE — Discharge Summary (Signed)
Physician Discharge Summary  Shirley Chen ZOX:[id number] DOB: [birth year] DOA: 09/25/2020  PCP: Marrian Salvage, FNP  Admit date: 09/25/2020 Discharge date: 10/04/2020  Admitted From: SNF Disposition: SNF  Recommendations for Outpatient Follow-up:  Follow up with PCP in 1-2 weeks Follow-up with orthopedic surgery for prosthesis fitting. Please obtain BMP/CBC in one week Please follow up on the following pending results: None  Home Health: None Equipment/Devices: Wheelchair Discharge Condition: Stable CODE STATUS: Full Diet recommendation: Heart Healthy / Carb Modified clinical code  Brief/Interim Summary: Shirley Chen is a 65 y.o. female with medical history significant of hypertension, diabetes, peripheral vascular disease, tobacco abuse, recent necrotizing fasciitis with left below-knee amputation in May, history of femoral-tibial bypass surgery in the past who was discharged to the skilled facility where she resides.   She presented to the hospital with nausea/vomiting. On work-up she was found to be febrile, 105 degrees, lethargic.  There was concern for underlying sepsis and she was started on vancomycin and cefepime for empiric coverage.  COVID-19 testing also came back positive and she had been placed on oxygen for low normal saturations. Further work-up revealed AKI and she was started on fluids as well.  Blood cultures were positive.  For ESBL E. Coli, patient completed course of antibiotics with meropenem.  Remained stable and afebrile.  Patient was also found to have positive for COVID-19 infection, no malaise fevers given a 5-day course of remdesivir which she completed during hospitalization.  Remained stable and now on room.  Patient was also found to have AKI with history of CKD stage III.  It was resolved with IV fluid and creatinine stable at baseline now.  Neurontin was increased to 3 times daily due to her complaint of lower extremity pain which resulted in  improvement of the symptoms and she was discharged on that dose.  She will continue with rest of her home medications and follow up with her providers for further management.  Discharge Diagnoses:  Active Problems:   Tobacco abuse   PAD (peripheral artery disease) (HCC)   HTN (hypertension)   Diabetes mellitus with peripheral vascular disease ([place])   Diabetic foot infection ([place])   Hyperlipidemia   Acute renal failure superimposed on stage 3b chronic kidney disease (HCC)   Severe sepsis ([place])   COVID-19 virus infection   Bacteremia due to Gram-negative bacteria   Discharge Instructions  Discharge Instructions     Diet - low sodium heart healthy   Complete by: As directed    Increase activity slowly   Complete by: As directed       Allergies as of 10/04/2020       Reactions   Losartan Nausea And Vomiting   Morphine And Related    Pt states " I just got really sick"        Medication List     STOP taking these medications    metFORMIN 500 MG tablet Commonly known as: GLUCOPHAGE   potassium chloride SA 20 MEQ tablet Commonly known as: KLOR-CON       TAKE these medications    Accu-Chek Guide test strip Generic drug: glucose blood USE 1 STRIP TO CHECK BLOOD  GLUCOSE 3 TIMES DAILY   ascorbic acid 500 MG tablet Commonly known as: VITAMIN C Take 1,000 mg by mouth at bedtime.   blood glucose meter kit and supplies Kit Dispense based on patient and insurance preference. Use up to four times daily as directed. (FOR ICD-9 250.00, 250.01). What changed: Another medication with  the same name was removed. Continue taking this medication, and follow the directions you see here.   calcium carbonate (dosed in mg elemental calcium) 1250 MG/5ML Susp Take 5 mLs (500 mg of elemental calcium total) by mouth every 6 (six) hours as needed for indigestion.   Cholecalciferol 25 MCG (1000 UT) Chew Chew 1,000 Units by mouth at bedtime.   colchicine 0.6 MG tablet Take 1  tablet (0.6 mg total) by mouth daily.   feeding supplement (GLUCERNA SHAKE) Liqd Take 237 mLs by mouth 2 (two) times daily between meals.   furosemide 40 MG tablet Commonly known as: LASIX Take 1 tablet (40 mg total) by mouth daily.   gabapentin 300 MG capsule Commonly known as: NEURONTIN Take 1 capsule (300 mg total) by mouth 3 (three) times daily. What changed:  medication strength when to take this   glucose monitoring kit monitoring kit 1 each by Does not apply route 4 (four) times daily - after meals and at bedtime. 1 month Diabetic Testing Supplies for QAC-QHS accuchecks. Any brand OK. Diagnosis E11.65   hydrALAZINE 50 MG tablet Commonly known as: APRESOLINE Take 1 tablet (50 mg total) by mouth 3 (three) times daily.   insulin glargine 100 UNIT/ML injection Commonly known as: LANTUS Inject 0.26 mLs (26 Units total) into the skin at bedtime.   ondansetron 4 MG tablet Commonly known as: ZOFRAN Take 4 mg by mouth every 6 (six) hours as needed for nausea or vomiting.   oxyCODONE-acetaminophen 7.5-325 MG tablet Commonly known as: PERCOCET Take 1 tablet by mouth every 6 (six) hours as needed for moderate pain.   spironolactone 25 MG tablet Commonly known as: ALDACTONE Take 1 tablet (25 mg total) by mouth daily.   zinc sulfate 220 (50 Zn) MG capsule Take 220 mg by mouth at bedtime.       ASK your doctor about these medications    amLODipine 10 MG tablet Commonly known as: NORVASC TAKE 1 TABLET BY MOUTH  DAILY   atorvastatin 40 MG tablet Commonly known as: LIPITOR TAKE 1 TABLET BY MOUTH  DAILY   carvedilol 25 MG tablet Commonly known as: COREG TAKE 1 TABLET BY MOUTH  TWICE DAILY WITH A MEAL   ezetimibe 10 MG tablet Commonly known as: ZETIA TAKE 1 TABLET BY MOUTH  DAILY   insulin aspart 100 UNIT/ML injection Commonly known as: novoLOG 0-15 Units, Subcutaneous, 3 times daily with meals CBG < 70: Implement Hypoglycemia measures CBG 70 - 120: 0 units CBG  121 - 150: 2 units CBG 151 - 200: 3 units CBG 201 - 250: 5 units CBG 251 - 300: 8 units CBG 301 - 350: 11 units CBG 351 - 400: 15 units CBG > 400: call MD        Follow-up Information     Marrian Salvage, FNP Follow up in 1 week(s).   Specialty: Internal Medicine Contact information: [place] [address] [phone]                Allergies  Allergen Reactions   Losartan Nausea And Vomiting   Morphine And Related     Pt states " I just got really sick"    Consultations: None  Procedures/Studies: DG ABD ACUTE 2+V W 1V CHEST  Result Date: 09/25/2020 CLINICAL DATA:  Cough vomiting EXAM: DG ABDOMEN ACUTE WITH 1 VIEW CHEST COMPARISON:  08/03/2020, 08/05/2020 FINDINGS: Single-view chest demonstrates no focal opacity or pleural effusion. Normal cardiomediastinal silhouette with aortic atherosclerosis. Calcified  granuloma on the left. Supine and upright views of the abdomen demonstrate no free air beneath the diaphragm. Gas-filled nondilated small bowel in the left mid abdomen with scattered distal gas. No radiopaque calculi IMPRESSION: No acute cardiopulmonary disease. Overall nonobstructed bowel gas pattern Electronically Signed   By: Donavan Foil M.D.   On: 09/25/2020 18:58    Subjective: Patient was seen and examined today.  Denies any new complaints.  Ready to go back to her facility Per patient she already working with orthopedic to get the processes, advised to reschedule her appointment so she can stay on track.  Discharge Exam: Vitals:   10/04/20 0436 10/04/20 0900  BP: 132/67 (!) 145/64  Pulse:  67  Resp:    Temp: 97.8 F (36.6 C) 97.7 F (36.5 C)  SpO2:  100%   Vitals:   10/03/20 1404 10/03/20 2249 10/04/20 0436 10/04/20 0900  BP: 134/71 (!) 154/60 132/67 (!) 145/64  Pulse: 70   67  Resp: 18     Temp:  98 F (36.7 C) 97.8 F (36.6 C) 97.7 F (36.5 C)  TempSrc:  Oral Oral Oral  SpO2: 98%   100%  Weight:      Height:         General: Pt is alert, awake, not in acute distress Cardiovascular: RRR, S1/S2 +, no rubs, no gallops Respiratory: CTA bilaterally, no wheezing, no rhonchi Abdominal: Soft, NT, ND, bowel sounds + Extremities: no edema, no cyanosis, left BKA, right big toe amputation   The results of significant diagnostics from this hospitalization (including imaging, microbiology, ancillary and laboratory) are listed below for reference.    Microbiology: Recent Results (from the past 240 hour(s))  Resp Panel by RT-PCR (Flu A&B, Covid) Nasopharyngeal Swab     Status: Abnormal   Collection Time: 09/25/20  7:46 PM   Specimen: Nasopharyngeal Swab; Nasopharyngeal(NP) swabs in vial transport medium  Result Value Ref Range Status   SARS Coronavirus 2 by RT PCR POSITIVE (A) NEGATIVE Final    Comment: RESULT CALLED TO, READ BACK BY AND VERIFIED WITH: J LOUDERMILK AT 2050 ON 09/25/2020 BY MOSLEY,J (NOTE) SARS-CoV-2 target nucleic acids are DETECTED.  The SARS-CoV-2 RNA is generally detectable in upper respiratory specimens during the acute phase of infection. Positive results are indicative of the presence of the identified virus, but do not rule out bacterial infection or co-infection with other pathogens not detected by the test. Clinical correlation with patient history and other diagnostic information is necessary to determine patient infection status. The expected result is Negative.  Fact Sheet for Patients: EntrepreneurPulse.com.au  Fact Sheet for Healthcare Providers: IncredibleEmployment.be  This test is not yet approved or cleared by the [place] FDA and  has been authorized for detection and/or diagnosis of SARS-CoV-2 by FDA under an Emergency Use Authorization (EUA).  This EUA will remain in effect (meaning this  test can be used) for the duration of  the COVID-19 declaration under Section 564(b)(1) of the Act, 21 U.S.C. section 360bbb-3(b)(1),  unless the authorization is terminated or revoked sooner.     Influenza A by PCR NEGATIVE NEGATIVE Final   Influenza B by PCR NEGATIVE NEGATIVE Final    Comment: (NOTE) The Xpert Xpress SARS-CoV-2/FLU/RSV plus assay is intended as an aid in the diagnosis of influenza from Nasopharyngeal swab specimens and should not be used as a sole basis for treatment. Nasal washings and aspirates are unacceptable for Xpert Xpress SARS-CoV-2/FLU/RSV testing.  Fact Sheet for Patients: EntrepreneurPulse.com.au  Fact Sheet  for Healthcare Providers: IncredibleEmployment.be  This test is not yet approved or cleared by the [place] and has been authorized for detection and/or diagnosis of SARS-CoV-2 by FDA under an Emergency Use Authorization (EUA). This EUA will remain in effect (meaning this test can be used) for the duration of the COVID-19 declaration under Section 564(b)(1) of the Act, 21 U.S.C. section 360bbb-3(b)(1), unless the authorization is terminated or revoked.  Performed at [facility], [place] [address] [place], [place] [address]   Culture, blood (Routine X 2) w Reflex to ID Panel     Status: Abnormal   Collection Time: 09/26/20  1:40 AM   Specimen: BLOOD  Result Value Ref Range Status   Specimen Description   Final    BLOOD BLOOD LEFT HAND Performed at [facility] [address] [place], [place] [address]    Special Requests   Final    BOTTLES DRAWN AEROBIC ONLY Blood Culture adequate volume Performed at [facility] [address], [place], [place] [address]    Culture  Setup Time   Final    GRAM NEGATIVE RODS AEROBIC BOTTLE ONLY CRITICAL RESULT CALLED TO, READ BACK BY AND VERIFIED WITH: Colin Rhein [facility] 2409 09/27/20 A BROWNING Performed at [facility], [place] [address], [place], [place] [address]    Culture (A)  Final    ESCHERICHIA COLI Confirmed Extended Spectrum  Beta-Lactamase Producer (ESBL).  In bloodstream infections from ESBL organisms, carbapenems are preferred over piperacillin/tazobactam. They are shown to have a lower risk of mortality.    Report Status 09/29/2020 FINAL  Final   Organism ID, Bacteria ESCHERICHIA COLI  Final      Susceptibility   Escherichia coli - MIC*    AMPICILLIN >=32 RESISTANT Resistant     CEFAZOLIN >=64 RESISTANT Resistant     CEFEPIME >=32 RESISTANT Resistant     CEFTAZIDIME RESISTANT Resistant     CEFTRIAXONE >=64 RESISTANT Resistant     CIPROFLOXACIN >=4 RESISTANT Resistant     GENTAMICIN <=1 SENSITIVE Sensitive     IMIPENEM <=0.25 SENSITIVE Sensitive     TRIMETH/SULFA <=20 SENSITIVE Sensitive     AMPICILLIN/SULBACTAM 16 INTERMEDIATE Intermediate     PIP/TAZO <=4 SENSITIVE Sensitive     * ESCHERICHIA COLI  Blood Culture ID Panel (Reflexed)     Status: Abnormal   Collection Time: 09/26/20  1:40 AM  Result Value Ref Range Status   Enterococcus faecalis NOT DETECTED NOT DETECTED Final   Enterococcus Faecium NOT DETECTED NOT DETECTED Final   Listeria monocytogenes NOT DETECTED NOT DETECTED Final   Staphylococcus species NOT DETECTED NOT DETECTED Final   Staphylococcus aureus (BCID) NOT DETECTED NOT DETECTED Final   Staphylococcus epidermidis NOT DETECTED NOT DETECTED Final   Staphylococcus lugdunensis NOT DETECTED NOT DETECTED Final   Streptococcus species NOT DETECTED NOT DETECTED Final   Streptococcus agalactiae NOT DETECTED NOT DETECTED Final   Streptococcus pneumoniae NOT DETECTED NOT DETECTED Final   Streptococcus pyogenes NOT DETECTED NOT DETECTED Final   A.calcoaceticus-baumannii NOT DETECTED NOT DETECTED Final   Bacteroides fragilis NOT DETECTED NOT DETECTED Final   Enterobacterales DETECTED (A) NOT DETECTED Final    Comment: Enterobacterales represent a large order of gram negative bacteria, not a single organism. CRITICAL RESULT CALLED TO, READ BACK BY AND VERIFIED WITH: Colin Rhein PHARMD [id number] 09/27/20  A BROWNING    Enterobacter cloacae complex NOT DETECTED NOT DETECTED Final   Escherichia coli DETECTED (A) NOT DETECTED Final  Comment: CRITICAL RESULT CALLED TO, READ BACK BY AND VERIFIED WITH: Colin Rhein PHARMD [id number] 09/27/20 A BROWNING    Klebsiella aerogenes NOT DETECTED NOT DETECTED Final   Klebsiella oxytoca NOT DETECTED NOT DETECTED Final   Klebsiella pneumoniae NOT DETECTED NOT DETECTED Final   Proteus species NOT DETECTED NOT DETECTED Final   Salmonella species NOT DETECTED NOT DETECTED Final   Serratia marcescens NOT DETECTED NOT DETECTED Final   Haemophilus influenzae NOT DETECTED NOT DETECTED Final   Neisseria meningitidis NOT DETECTED NOT DETECTED Final   Pseudomonas aeruginosa NOT DETECTED NOT DETECTED Final   Stenotrophomonas maltophilia NOT DETECTED NOT DETECTED Final   Candida albicans NOT DETECTED NOT DETECTED Final   Candida auris NOT DETECTED NOT DETECTED Final   Candida glabrata NOT DETECTED NOT DETECTED Final   Candida krusei NOT DETECTED NOT DETECTED Final   Candida parapsilosis NOT DETECTED NOT DETECTED Final   Candida tropicalis NOT DETECTED NOT DETECTED Final   Cryptococcus neoformans/gattii NOT DETECTED NOT DETECTED Final   CTX-M ESBL DETECTED (A) NOT DETECTED Final    Comment: CRITICAL RESULT CALLED TO, READ BACK BY AND VERIFIED WITH: Colin Rhein PHARMD [id number] 09/27/20 A BROWNING (NOTE) Extended spectrum beta-lactamase detected. Recommend a carbapenem as initial therapy.      Carbapenem resistance IMP NOT DETECTED NOT DETECTED Final   Carbapenem resistance KPC NOT DETECTED NOT DETECTED Final   Carbapenem resistance NDM NOT DETECTED NOT DETECTED Final   Carbapenem resist OXA 48 LIKE NOT DETECTED NOT DETECTED Final   Carbapenem resistance VIM NOT DETECTED NOT DETECTED Final    Comment: Performed at [facility], [place] [address] [place], [place] [address]  MRSA PCR Screening     Status: None   Collection Time: 09/26/20  3:30 AM  Result Value Ref Range  Status   MRSA by PCR NEGATIVE NEGATIVE Final    Comment:        The GeneXpert MRSA Assay (FDA approved for NASAL specimens only), is one component of a comprehensive MRSA colonization surveillance program. It is not intended to diagnose MRSA infection nor to guide or monitor treatment for MRSA infections. Performed at [facility], [place] [address], [place], [place] [address]   Culture, blood (routine x 2)     Status: None   Collection Time: 09/28/20 10:54 AM   Specimen: BLOOD  Result Value Ref Range Status   Specimen Description   Final    BLOOD RIGHT ANTECUBITAL Performed at [facility] [address], [place], [place] [address]    Special Requests   Final    BOTTLES DRAWN AEROBIC ONLY Blood Culture adequate volume Performed at [facility] [address] [place], [place] [address]    Culture   Final    NO GROWTH 5 DAYS Performed at [facility], [place] [address], [place], [place] [address]    Report Status 10/03/2020 FINAL  Final  Culture, blood (routine x 2)     Status: None   Collection Time: 09/28/20 10:54 AM   Specimen: BLOOD RIGHT HAND  Result Value Ref Range Status   Specimen Description   Final    BLOOD RIGHT HAND Performed at [facility] [address], [place],  [address]    Special Requests   Final    BOTTLES DRAWN AEROBIC ONLY Blood Culture results may not be optimal due to an inadequate volume of blood received in culture bottles Performed at [facility], [address] [place], [place] [address]    Culture   Final    NO GROWTH 5 DAYS Performed at [facility], [place] [address] [place], [place] [address]    Report Status 10/03/2020 FINAL  Final     Labs: BNP (last 3 results) Recent Labs    08/02/20 0559 08/05/20 0312 08/07/20 0236  BNP 440.3* 501.4* 329.5*   Basic Metabolic Panel: Recent Labs  Lab 09/28/20 0335 09/29/20 0434  09/30/20 0342 10/01/20 0333  NA 140 136 137 138  K 3.9 3.9 4.1 4.2  CL 103 100 100 104  CO2 '26 27 28 27  ' GLUCOSE 253* 292* 341* 361*  BUN 37* 37* 44* 45*  CREATININE 1.07* 0.99 0.90 0.90  CALCIUM 9.0 8.9 8.8* 8.7*  MG 2.1 2.0 2.0 2.3   Liver Function Tests: Recent Labs  Lab 09/28/20 0335 09/29/20 0434 09/30/20 0342 10/01/20 0333  AST '16 26 29 20  ' ALT 19 29 35 33  ALKPHOS 120 129* 107 97  BILITOT 0.8 0.9 0.8 0.8  PROT 6.6 6.9 6.3* 6.0*  ALBUMIN 3.0* 3.0* 2.9* 2.7*   No results for input(s): LIPASE, AMYLASE in the last 168 hours. No results for input(s): AMMONIA in the last 168 hours. CBC: Recent Labs  Lab 09/28/20 0335 09/29/20 0434 09/30/20 0342 10/01/20 0333  WBC 13.5* 8.2 10.3 10.3  NEUTROABS 11.2* 6.8 8.4*  --   HGB 12.1 12.9 12.8 12.6  HCT 37.7 39.9 39.9 38.7  MCV 92.9 91.7 93.0 91.9  PLT 224 211 211 213   Cardiac Enzymes: No results for input(s): CKTOTAL, CKMB, CKMBINDEX, TROPONINI in the last 168 hours. BNP: Invalid input(s): POCBNP CBG: Recent Labs  Lab 10/02/20 2052 10/03/20 0827 10/03/20 1237 10/03/20 1600 10/04/20 0753  GLUCAP 200* 129* 201* 165* 151*   D-Dimer No results for input(s): DDIMER in the last 72 hours. Hgb A1c No results for input(s): HGBA1C in the last 72 hours. Lipid Profile No results for input(s): CHOL, HDL, LDLCALC, TRIG, CHOLHDL, LDLDIRECT in the last 72 hours. Thyroid function studies No results for input(s): TSH, T4TOTAL, T3FREE, THYROIDAB in the last 72 hours.  Invalid input(s): FREET3 Anemia work up No results for input(s): VITAMINB12, FOLATE, FERRITIN, TIBC, IRON, RETICCTPCT in the last 72 hours. Urinalysis    Component Value Date/Time   COLORURINE YELLOW 09/26/2020 0330   APPEARANCEUR HAZY (A) 09/26/2020 0330   LABSPEC 1.010 09/26/2020 0330   PHURINE 5.0 09/26/2020 0330   GLUCOSEU NEGATIVE 09/26/2020 0330   HGBUR SMALL (A) 09/26/2020 0330   BILIRUBINUR NEGATIVE 09/26/2020 0330   KETONESUR NEGATIVE  09/26/2020 0330   PROTEINUR 30 (A) 09/26/2020 0330   UROBILINOGEN 0.2 11/29/2014 1231   NITRITE NEGATIVE 09/26/2020 0330   LEUKOCYTESUR LARGE (A) 09/26/2020 0330   Sepsis Labs Invalid input(s): PROCALCITONIN,  WBC,  LACTICIDVEN Microbiology Recent Results (from the past 240 hour(s))  Resp Panel by RT-PCR (Flu A&B, Covid) Nasopharyngeal Swab     Status: Abnormal   Collection Time: 09/25/20  7:46 PM   Specimen: Nasopharyngeal Swab; Nasopharyngeal(NP) swabs in vial transport medium  Result Value Ref Range Status   SARS Coronavirus 2 by RT PCR POSITIVE (A) NEGATIVE Final    Comment: RESULT CALLED TO, READ BACK BY AND VERIFIED WITH: J LOUDERMILK AT 2050 ON 09/25/2020 BY MOSLEY,J (NOTE) SARS-CoV-2 target nucleic acids are DETECTED.  The SARS-CoV-2 RNA is generally detectable in upper respiratory specimens during the acute phase of infection. Positive results are indicative of the presence of the identified  virus, but do not rule out bacterial infection or co-infection with other pathogens not detected by the test. Clinical correlation with patient history and other diagnostic information is necessary to determine patient infection status. The expected result is Negative.  Fact Sheet for Patients: EntrepreneurPulse.com.au  Fact Sheet for Healthcare Providers: IncredibleEmployment.be  This test is not yet approved or cleared by the [place] FDA and  has been authorized for detection and/or diagnosis of SARS-CoV-2 by FDA under an Emergency Use Authorization (EUA).  This EUA will remain in effect (meaning this  test can be used) for the duration of  the COVID-19 declaration under Section 564(b)(1) of the Act, 21 U.S.C. section 360bbb-3(b)(1), unless the authorization is terminated or revoked sooner.     Influenza A by PCR NEGATIVE NEGATIVE Final   Influenza B by PCR NEGATIVE NEGATIVE Final    Comment: (NOTE) The Xpert Xpress  SARS-CoV-2/FLU/RSV plus assay is intended as an aid in the diagnosis of influenza from Nasopharyngeal swab specimens and should not be used as a sole basis for treatment. Nasal washings and aspirates are unacceptable for Xpert Xpress SARS-CoV-2/FLU/RSV testing.  Fact Sheet for Patients: EntrepreneurPulse.com.au  Fact Sheet for Healthcare Providers: IncredibleEmployment.be  This test is not yet approved or cleared by the [place] FDA and has been authorized for detection and/or diagnosis of SARS-CoV-2 by FDA under an Emergency Use Authorization (EUA). This EUA will remain in effect (meaning this test can be used) for the duration of the COVID-19 declaration under Section 564(b)(1) of the Act, 21 U.S.C. section 360bbb-3(b)(1), unless the authorization is terminated or revoked.  Performed at [facility], [place] [address] [place], [place] [address]   Culture, blood (Routine X 2) w Reflex to ID Panel     Status: Abnormal   Collection Time: 09/26/20  1:40 AM   Specimen: BLOOD  Result Value Ref Range Status   Specimen Description   Final    BLOOD BLOOD LEFT HAND Performed at [facility] [address], [place], [place] [address]    Special Requests   Final    BOTTLES DRAWN AEROBIC ONLY Blood Culture adequate volume Performed at [facility] [address] [place], [place] [address]    Culture  Setup Time   Final    GRAM NEGATIVE RODS AEROBIC BOTTLE ONLY CRITICAL RESULT CALLED TO, READ BACK BY AND VERIFIED WITH: Colin Rhein [facility] 6811 09/27/20 A BROWNING Performed at [facility], [place] [address], [place],  [address]    Culture (A)  Final    ESCHERICHIA COLI Confirmed Extended Spectrum Beta-Lactamase Producer (ESBL).  In bloodstream infections from ESBL organisms, carbapenems are preferred over piperacillin/tazobactam. They are shown to have a lower risk of mortality.     Report Status 09/29/2020 FINAL  Final   Organism ID, Bacteria ESCHERICHIA COLI  Final      Susceptibility   Escherichia coli - MIC*    AMPICILLIN >=32 RESISTANT Resistant     CEFAZOLIN >=64 RESISTANT Resistant     CEFEPIME >=32 RESISTANT Resistant     CEFTAZIDIME RESISTANT Resistant     CEFTRIAXONE >=64 RESISTANT Resistant     CIPROFLOXACIN >=4 RESISTANT Resistant     GENTAMICIN <=1 SENSITIVE Sensitive     IMIPENEM <=0.25 SENSITIVE Sensitive     TRIMETH/SULFA <=20 SENSITIVE Sensitive     AMPICILLIN/SULBACTAM 16 INTERMEDIATE Intermediate     PIP/TAZO <=4 SENSITIVE Sensitive     * ESCHERICHIA COLI  Blood Culture ID  Panel (Reflexed)     Status: Abnormal   Collection Time: 09/26/20  1:40 AM  Result Value Ref Range Status   Enterococcus faecalis NOT DETECTED NOT DETECTED Final   Enterococcus Faecium NOT DETECTED NOT DETECTED Final   Listeria monocytogenes NOT DETECTED NOT DETECTED Final   Staphylococcus species NOT DETECTED NOT DETECTED Final   Staphylococcus aureus (BCID) NOT DETECTED NOT DETECTED Final   Staphylococcus epidermidis NOT DETECTED NOT DETECTED Final   Staphylococcus lugdunensis NOT DETECTED NOT DETECTED Final   Streptococcus species NOT DETECTED NOT DETECTED Final   Streptococcus agalactiae NOT DETECTED NOT DETECTED Final   Streptococcus pneumoniae NOT DETECTED NOT DETECTED Final   Streptococcus pyogenes NOT DETECTED NOT DETECTED Final   A.calcoaceticus-baumannii NOT DETECTED NOT DETECTED Final   Bacteroides fragilis NOT DETECTED NOT DETECTED Final   Enterobacterales DETECTED (A) NOT DETECTED Final    Comment: Enterobacterales represent a large order of gram negative bacteria, not a single organism. CRITICAL RESULT CALLED TO, READ BACK BY AND VERIFIED WITH: Colin Rhein PHARMD [id number] 09/27/20 A BROWNING    Enterobacter cloacae complex NOT DETECTED NOT DETECTED Final   Escherichia coli DETECTED (A) NOT DETECTED Final    Comment: CRITICAL RESULT CALLED TO, READ BACK BY AND  VERIFIED WITH: Colin Rhein PHARMD [id number] 09/27/20 A BROWNING    Klebsiella aerogenes NOT DETECTED NOT DETECTED Final   Klebsiella oxytoca NOT DETECTED NOT DETECTED Final   Klebsiella pneumoniae NOT DETECTED NOT DETECTED Final   Proteus species NOT DETECTED NOT DETECTED Final   Salmonella species NOT DETECTED NOT DETECTED Final   Serratia marcescens NOT DETECTED NOT DETECTED Final   Haemophilus influenzae NOT DETECTED NOT DETECTED Final   Neisseria meningitidis NOT DETECTED NOT DETECTED Final   Pseudomonas aeruginosa NOT DETECTED NOT DETECTED Final   Stenotrophomonas maltophilia NOT DETECTED NOT DETECTED Final   Candida albicans NOT DETECTED NOT DETECTED Final   Candida auris NOT DETECTED NOT DETECTED Final   Candida glabrata NOT DETECTED NOT DETECTED Final   Candida krusei NOT DETECTED NOT DETECTED Final   Candida parapsilosis NOT DETECTED NOT DETECTED Final   Candida tropicalis NOT DETECTED NOT DETECTED Final   Cryptococcus neoformans/gattii NOT DETECTED NOT DETECTED Final   CTX-M ESBL DETECTED (A) NOT DETECTED Final    Comment: CRITICAL RESULT CALLED TO, READ BACK BY AND VERIFIED WITH: Colin Rhein PHARMD [id number] 09/27/20 A BROWNING (NOTE) Extended spectrum beta-lactamase detected. Recommend a carbapenem as initial therapy.      Carbapenem resistance IMP NOT DETECTED NOT DETECTED Final   Carbapenem resistance KPC NOT DETECTED NOT DETECTED Final   Carbapenem resistance NDM NOT DETECTED NOT DETECTED Final   Carbapenem resist OXA 48 LIKE NOT DETECTED NOT DETECTED Final   Carbapenem resistance VIM NOT DETECTED NOT DETECTED Final    Comment: Performed at [facility], [place] [address], [place], [place] [address]  MRSA PCR Screening     Status: None   Collection Time: 09/26/20  3:30 AM  Result Value Ref Range Status   MRSA by PCR NEGATIVE NEGATIVE Final    Comment:        The GeneXpert MRSA Assay (FDA approved for NASAL specimens only), is one component of a comprehensive MRSA  colonization surveillance program. It is not intended to diagnose MRSA infection nor to guide or monitor treatment for MRSA infections. Performed at [facility], [place] [address] [place], [place] [address]   Culture, blood (routine x 2)     Status: None   Collection  Time: 09/28/20 10:54 AM   Specimen: BLOOD  Result Value Ref Range Status   Specimen Description   Final    BLOOD RIGHT ANTECUBITAL Performed at [facility] [address] [place], [place] [address]    Special Requests   Final    BOTTLES DRAWN AEROBIC ONLY Blood Culture adequate volume Performed at [place] [address] [place], [place] [address]    Culture   Final    NO GROWTH 5 DAYS Performed at [facility], [place] [address] [place], [place] [address]    Report Status 10/03/2020 FINAL  Final  Culture, blood (routine x 2)     Status: None   Collection Time: 09/28/20 10:54 AM   Specimen: BLOOD RIGHT HAND  Result Value Ref Range Status   Specimen Description   Final    BLOOD RIGHT HAND Performed at [place] [place] [address], [place], [place] [address]    Special Requests   Final    BOTTLES DRAWN AEROBIC ONLY Blood Culture results may not be optimal due to an inadequate volume of blood received in culture bottles Performed at [facility] [address], [place], [place] [address]    Culture   Final    NO GROWTH 5 DAYS Performed at [facility], [place] [address] [place],  [address]    Report Status 10/03/2020 FINAL  Final    Time coordinating discharge: Over 30 minutes  SIGNED:  Lorella Nimrod, MD  [facility] 10/04/2020, 10:46 AM  If 7PM-7AM, please contact night-coverage [email or web address]  This record has been created using Systems analyst. Errors have been sought and corrected,but may not always be located. Such creation errors do not reflect on the  standard of care.

## 2020-10-09 ENCOUNTER — Telehealth: Payer: Self-pay

## 2020-10-09 DIAGNOSIS — S81802A Unspecified open wound, left lower leg, initial encounter: Secondary | ICD-10-CM | POA: Diagnosis not present

## 2020-10-09 DIAGNOSIS — M79662 Pain in left lower leg: Secondary | ICD-10-CM | POA: Diagnosis not present

## 2020-10-09 DIAGNOSIS — M6281 Muscle weakness (generalized): Secondary | ICD-10-CM | POA: Diagnosis not present

## 2020-10-09 DIAGNOSIS — W19XXXA Unspecified fall, initial encounter: Secondary | ICD-10-CM | POA: Diagnosis not present

## 2020-10-09 DIAGNOSIS — Z89512 Acquired absence of left leg below knee: Secondary | ICD-10-CM | POA: Diagnosis not present

## 2020-10-09 NOTE — Telephone Encounter (Signed)
[place] NP from [facility] rehab calls today to report that patient fell over the weekend. Says there is about a half a cm opening on the incision. Draining a small amount of clear fluid. No s/s of infection. Advised to pad area and cover - they are going to monitor the area. Instructed to call back if any further issues develop.

## 2020-10-10 DIAGNOSIS — I10 Essential (primary) hypertension: Secondary | ICD-10-CM | POA: Diagnosis not present

## 2020-10-10 DIAGNOSIS — E1159 Type 2 diabetes mellitus with other circulatory complications: Secondary | ICD-10-CM | POA: Diagnosis not present

## 2020-10-11 DIAGNOSIS — U099 Post covid-19 condition, unspecified: Secondary | ICD-10-CM | POA: Diagnosis not present

## 2020-10-11 DIAGNOSIS — Z89512 Acquired absence of left leg below knee: Secondary | ICD-10-CM | POA: Diagnosis not present

## 2020-10-11 DIAGNOSIS — E1151 Type 2 diabetes mellitus with diabetic peripheral angiopathy without gangrene: Secondary | ICD-10-CM | POA: Diagnosis not present

## 2020-10-11 DIAGNOSIS — I739 Peripheral vascular disease, unspecified: Secondary | ICD-10-CM | POA: Diagnosis not present

## 2020-10-15 DIAGNOSIS — M79662 Pain in left lower leg: Secondary | ICD-10-CM | POA: Diagnosis not present

## 2020-10-15 DIAGNOSIS — Z89512 Acquired absence of left leg below knee: Secondary | ICD-10-CM | POA: Diagnosis not present

## 2020-10-15 DIAGNOSIS — S81802D Unspecified open wound, left lower leg, subsequent encounter: Secondary | ICD-10-CM | POA: Diagnosis not present

## 2020-10-15 DIAGNOSIS — R600 Localized edema: Secondary | ICD-10-CM | POA: Diagnosis not present

## 2020-10-17 DIAGNOSIS — I10 Essential (primary) hypertension: Secondary | ICD-10-CM | POA: Diagnosis not present

## 2020-10-17 DIAGNOSIS — L03116 Cellulitis of left lower limb: Secondary | ICD-10-CM | POA: Diagnosis not present

## 2020-10-17 DIAGNOSIS — S81802D Unspecified open wound, left lower leg, subsequent encounter: Secondary | ICD-10-CM | POA: Diagnosis not present

## 2020-10-17 DIAGNOSIS — Z89512 Acquired absence of left leg below knee: Secondary | ICD-10-CM | POA: Diagnosis not present

## 2020-10-17 DIAGNOSIS — R600 Localized edema: Secondary | ICD-10-CM | POA: Diagnosis not present

## 2020-10-17 DIAGNOSIS — M79661 Pain in right lower leg: Secondary | ICD-10-CM | POA: Diagnosis not present

## 2020-10-17 DIAGNOSIS — E1159 Type 2 diabetes mellitus with other circulatory complications: Secondary | ICD-10-CM | POA: Diagnosis not present

## 2020-10-17 DIAGNOSIS — I872 Venous insufficiency (chronic) (peripheral): Secondary | ICD-10-CM | POA: Diagnosis not present

## 2020-10-22 DIAGNOSIS — M6281 Muscle weakness (generalized): Secondary | ICD-10-CM | POA: Diagnosis not present

## 2020-10-22 DIAGNOSIS — Z89512 Acquired absence of left leg below knee: Secondary | ICD-10-CM | POA: Diagnosis not present

## 2020-10-23 DIAGNOSIS — M6281 Muscle weakness (generalized): Secondary | ICD-10-CM | POA: Diagnosis not present

## 2020-10-23 DIAGNOSIS — Z89512 Acquired absence of left leg below knee: Secondary | ICD-10-CM | POA: Diagnosis not present

## 2020-10-24 DIAGNOSIS — I10 Essential (primary) hypertension: Secondary | ICD-10-CM | POA: Diagnosis not present

## 2020-10-24 DIAGNOSIS — E1159 Type 2 diabetes mellitus with other circulatory complications: Secondary | ICD-10-CM | POA: Diagnosis not present

## 2020-10-24 DIAGNOSIS — Z89512 Acquired absence of left leg below knee: Secondary | ICD-10-CM | POA: Diagnosis not present

## 2020-10-24 DIAGNOSIS — M6281 Muscle weakness (generalized): Secondary | ICD-10-CM | POA: Diagnosis not present

## 2020-10-25 DIAGNOSIS — R600 Localized edema: Secondary | ICD-10-CM | POA: Diagnosis not present

## 2020-10-25 DIAGNOSIS — K5903 Drug induced constipation: Secondary | ICD-10-CM | POA: Diagnosis not present

## 2020-10-25 DIAGNOSIS — M6281 Muscle weakness (generalized): Secondary | ICD-10-CM | POA: Diagnosis not present

## 2020-10-25 DIAGNOSIS — Z89512 Acquired absence of left leg below knee: Secondary | ICD-10-CM | POA: Diagnosis not present

## 2020-10-26 DIAGNOSIS — Z139 Encounter for screening, unspecified: Secondary | ICD-10-CM | POA: Diagnosis not present

## 2020-10-26 DIAGNOSIS — N1832 Chronic kidney disease, stage 3b: Secondary | ICD-10-CM | POA: Diagnosis not present

## 2020-10-26 DIAGNOSIS — K649 Unspecified hemorrhoids: Secondary | ICD-10-CM | POA: Diagnosis not present

## 2020-10-26 DIAGNOSIS — D649 Anemia, unspecified: Secondary | ICD-10-CM | POA: Diagnosis not present

## 2020-10-26 DIAGNOSIS — R141 Gas pain: Secondary | ICD-10-CM | POA: Diagnosis not present

## 2020-10-26 DIAGNOSIS — K5903 Drug induced constipation: Secondary | ICD-10-CM | POA: Diagnosis not present

## 2020-10-26 DIAGNOSIS — R109 Unspecified abdominal pain: Secondary | ICD-10-CM | POA: Diagnosis not present

## 2020-10-26 DIAGNOSIS — E11628 Type 2 diabetes mellitus with other skin complications: Secondary | ICD-10-CM | POA: Diagnosis not present

## 2020-10-26 DIAGNOSIS — I10 Essential (primary) hypertension: Secondary | ICD-10-CM | POA: Diagnosis not present

## 2020-10-27 DIAGNOSIS — M6281 Muscle weakness (generalized): Secondary | ICD-10-CM | POA: Diagnosis not present

## 2020-10-27 DIAGNOSIS — Z89512 Acquired absence of left leg below knee: Secondary | ICD-10-CM | POA: Diagnosis not present

## 2020-10-29 ENCOUNTER — Ambulatory Visit (INDEPENDENT_AMBULATORY_CARE_PROVIDER_SITE_OTHER): Payer: Medicare Other | Admitting: Physician Assistant

## 2020-10-29 ENCOUNTER — Other Ambulatory Visit: Payer: Self-pay

## 2020-10-29 VITALS — BP 117/62 | HR 74 | Temp 98.1°F | Resp 20

## 2020-10-29 DIAGNOSIS — I739 Peripheral vascular disease, unspecified: Secondary | ICD-10-CM

## 2020-10-29 DIAGNOSIS — E1159 Type 2 diabetes mellitus with other circulatory complications: Secondary | ICD-10-CM | POA: Diagnosis not present

## 2020-10-29 DIAGNOSIS — Z89512 Acquired absence of left leg below knee: Secondary | ICD-10-CM | POA: Diagnosis not present

## 2020-10-29 DIAGNOSIS — M6281 Muscle weakness (generalized): Secondary | ICD-10-CM | POA: Diagnosis not present

## 2020-10-29 DIAGNOSIS — I10 Essential (primary) hypertension: Secondary | ICD-10-CM | POA: Diagnosis not present

## 2020-10-29 NOTE — Progress Notes (Signed)
POST OPERATIVE OFFICE NOTE    CC:  F/u for surgery  HPI:  This is a 65 y.o. female who is s/p left BKA by Dr. Trula Slade on 08/08/2020 secondary to non-healing left foot ulcer. She is currently residing at [facility] care center.  She says she is compliant with aspirin and statin.  Surgical staples have been removed.  No significant pain.  No fever or chills.  She states she has some right lower extremity edema has been treated with Lasix.  Denies rest pain.  Her vascular history is significant for right femoral to anterior tibial bypass in 2016 by Dr. Scot Dock  Allergies  Allergen Reactions   Losartan Nausea And Vomiting   Morphine And Related     Pt states " I just got really sick"    Current Outpatient Medications  Medication Sig Dispense Refill   ACCU-CHEK GUIDE test strip USE 1 STRIP TO CHECK BLOOD  GLUCOSE 3 TIMES DAILY 300 strip 3   amLODipine (NORVASC) 10 MG tablet TAKE 1 TABLET BY MOUTH  DAILY (Patient taking differently: Take 10 mg by mouth daily.) 90 tablet 3   ascorbic acid (VITAMIN C) 500 MG tablet Take 1,000 mg by mouth at bedtime.     atorvastatin (LIPITOR) 40 MG tablet TAKE 1 TABLET BY MOUTH  DAILY (Patient taking differently: Take 40 mg by mouth daily.) 90 tablet 3   blood glucose meter kit and supplies KIT Dispense based on patient and insurance preference. Use up to four times daily as directed. (FOR ICD-9 250.00, 250.01). 1 each 0   Calcium Carbonate Antacid (CALCIUM CARBONATE, DOSED IN MG ELEMENTAL CALCIUM,) 1250 MG/5ML SUSP Take 5 mLs (500 mg of elemental calcium total) by mouth every 6 (six) hours as needed for indigestion. 450 mL    carvedilol (COREG) 25 MG tablet TAKE 1 TABLET BY MOUTH  TWICE DAILY WITH A MEAL (Patient taking differently: Take 25 mg by mouth 2 (two) times daily with a meal.) 180 tablet 3   Cholecalciferol 25 MCG (1000 UT) CHEW Chew 1,000 Units by mouth at bedtime.     colchicine 0.6 MG tablet Take 1 tablet (0.6 mg total) by mouth daily.      ezetimibe (ZETIA) 10 MG tablet TAKE 1 TABLET BY MOUTH  DAILY (Patient taking differently: Take 10 mg by mouth daily.) 90 tablet 3   feeding supplement, GLUCERNA SHAKE, (GLUCERNA SHAKE) LIQD Take 237 mLs by mouth 2 (two) times daily between meals.  0   furosemide (LASIX) 40 MG tablet Take 1 tablet (40 mg total) by mouth daily. 30 tablet    gabapentin (NEURONTIN) 300 MG capsule Take 1 capsule (300 mg total) by mouth 3 (three) times daily.     glucose monitoring kit (FREESTYLE) monitoring kit 1 each by Does not apply route 4 (four) times daily - after meals and at bedtime. 1 month Diabetic Testing Supplies for QAC-QHS accuchecks. Any brand OK. Diagnosis E11.65 1 each 1   hydrALAZINE (APRESOLINE) 50 MG tablet Take 1 tablet (50 mg total) by mouth 3 (three) times daily. 405 tablet 3   insulin aspart (NOVOLOG) 100 UNIT/ML injection 0-15 Units, Subcutaneous, 3 times daily with meals CBG < 70: Implement Hypoglycemia measures CBG 70 - 120: 0 units CBG 121 - 150: 2 units CBG 151 - 200: 3 units CBG 201 - 250: 5 units CBG 251 - 300: 8 units CBG 301 - 350: 11 units CBG 351 - 400: 15 units CBG > 400: call MD (Patient taking differently: Inject  0-15 Units into the skin 3 (three) times daily. 0-15 Units, Subcutaneous, 3 times daily with meals CBG < 70: Implement Hypoglycemia measures CBG 70 - 120: 0 units CBG 121 - 150: 2 units CBG 151 - 200: 3 units CBG 201 - 250: 5 units CBG 251 - 300: 8 units CBG 301 - 350: 11 units CBG 351 - 400: 15 units CBG > 400: call MD) 10 mL 11   insulin glargine (LANTUS) 100 UNIT/ML injection Inject 0.26 mLs (26 Units total) into the skin at bedtime. 10 mL 11   ondansetron (ZOFRAN) 4 MG tablet Take 4 mg by mouth every 6 (six) hours as needed for nausea or vomiting.     oxyCODONE-acetaminophen (PERCOCET) 7.5-325 MG tablet Take 1 tablet by mouth every 6 (six) hours as needed for moderate pain. 20 tablet 0   spironolactone (ALDACTONE) 25 MG tablet Take 1 tablet (25 mg total) by  mouth daily.     zinc sulfate 220 (50 Zn) MG capsule Take 220 mg by mouth at bedtime.     Potassium Chloride ER 20 MEQ TBCR Take 1 tablet by mouth daily.     No current facility-administered medications for this visit.     ROS:  See HPI  BP 117/62 (BP Location: Right Arm, Patient Position: Sitting, Cuff Size: Large)   Pulse 74   Temp 98.1 F (36.7 C) (Temporal)   Resp 20   SpO2 96%   Physical Exam:  General appearance: Awake, alert in no apparent distress Cardiac: Heart rate and rhythm are regular Respirations: Nonlabored Extremities: Amputation site incision is well approximated without bleeding or hematoma.  Anterior and posterior flaps are warm and well-perfused.  Mild residual edema. Right lower extremity: Mild erythema and edema.  No open wounds.  2+ palpable DP pulse.  Assessment/Plan:  This is a 65 y.o. female who is s/p: left BKA.  Her incision is healing nicely and the flaps are viable.  The laundry facility unfortunately lost her shrinker stocking.  We will prescribe additional shrinker stocking and prescription for left lower extremity prosthesis.  The patient has a left Below Knee Amputation. The patient is well motivated to return to their prior functional status by utilizing a prosthesis to perform ADL's and maintain a healthy lifestyle. The patient has the physical and cognitive capacity to function with a prosthesis.   Functional Level: K3 Community Ambulator: Has the ability or potential for ambulation with variable cadence, to traverse most environmental barriers, and may have vocational, therapeutic, or exercise activity that demands prosthetic utilization beyond simple locomotion. Pt may benefit from Education officer, environmental.   Residual Limb History: The skin condition of the residual limb is good. The patient will continue to monitor the skin of the residual limb and follow hygiene instructions.  The patient is experiencing no pain related to  amputation  Prosthetic Prescription Plan: Counseling and education regarding prosthetic management will be provided to the patient via a certified prosthetist. A multi-discipline team, including physical therapy, will manage the prosthetic fabrication, fitting and prosthetic gait training.    We will make arrangements for follow-up in May 2023 for right lower extremity arterial duplex and ABI.  Risa Grill, PA-C [facility] [phone]  Clinic MD:  Trula Slade

## 2020-10-30 DIAGNOSIS — M6281 Muscle weakness (generalized): Secondary | ICD-10-CM | POA: Diagnosis not present

## 2020-10-30 DIAGNOSIS — L97821 Non-pressure chronic ulcer of other part of left lower leg limited to breakdown of skin: Secondary | ICD-10-CM | POA: Diagnosis not present

## 2020-10-30 DIAGNOSIS — Z89512 Acquired absence of left leg below knee: Secondary | ICD-10-CM | POA: Diagnosis not present

## 2020-10-31 ENCOUNTER — Other Ambulatory Visit: Payer: Self-pay

## 2020-10-31 DIAGNOSIS — I739 Peripheral vascular disease, unspecified: Secondary | ICD-10-CM

## 2020-11-01 DIAGNOSIS — M6281 Muscle weakness (generalized): Secondary | ICD-10-CM | POA: Diagnosis not present

## 2020-11-01 DIAGNOSIS — Z89512 Acquired absence of left leg below knee: Secondary | ICD-10-CM | POA: Diagnosis not present

## 2020-11-02 DIAGNOSIS — M6281 Muscle weakness (generalized): Secondary | ICD-10-CM | POA: Diagnosis not present

## 2020-11-02 DIAGNOSIS — Z89512 Acquired absence of left leg below knee: Secondary | ICD-10-CM | POA: Diagnosis not present

## 2020-11-03 DIAGNOSIS — M6281 Muscle weakness (generalized): Secondary | ICD-10-CM | POA: Diagnosis not present

## 2020-11-03 DIAGNOSIS — Z89512 Acquired absence of left leg below knee: Secondary | ICD-10-CM | POA: Diagnosis not present

## 2020-11-05 DIAGNOSIS — M6281 Muscle weakness (generalized): Secondary | ICD-10-CM | POA: Diagnosis not present

## 2020-11-05 DIAGNOSIS — Z89512 Acquired absence of left leg below knee: Secondary | ICD-10-CM | POA: Diagnosis not present

## 2020-11-06 DIAGNOSIS — M6281 Muscle weakness (generalized): Secondary | ICD-10-CM | POA: Diagnosis not present

## 2020-11-06 DIAGNOSIS — Z89512 Acquired absence of left leg below knee: Secondary | ICD-10-CM | POA: Diagnosis not present

## 2020-11-07 DIAGNOSIS — Z89512 Acquired absence of left leg below knee: Secondary | ICD-10-CM | POA: Diagnosis not present

## 2020-11-07 DIAGNOSIS — M6281 Muscle weakness (generalized): Secondary | ICD-10-CM | POA: Diagnosis not present

## 2020-11-08 DIAGNOSIS — Z89512 Acquired absence of left leg below knee: Secondary | ICD-10-CM | POA: Diagnosis not present

## 2020-11-08 DIAGNOSIS — M6281 Muscle weakness (generalized): Secondary | ICD-10-CM | POA: Diagnosis not present

## 2020-11-09 DIAGNOSIS — M6281 Muscle weakness (generalized): Secondary | ICD-10-CM | POA: Diagnosis not present

## 2020-11-09 DIAGNOSIS — Z89512 Acquired absence of left leg below knee: Secondary | ICD-10-CM | POA: Diagnosis not present

## 2020-11-11 DIAGNOSIS — Z89512 Acquired absence of left leg below knee: Secondary | ICD-10-CM | POA: Diagnosis not present

## 2020-11-11 DIAGNOSIS — M6281 Muscle weakness (generalized): Secondary | ICD-10-CM | POA: Diagnosis not present

## 2020-11-14 DIAGNOSIS — M6281 Muscle weakness (generalized): Secondary | ICD-10-CM | POA: Diagnosis not present

## 2020-11-14 DIAGNOSIS — Z89512 Acquired absence of left leg below knee: Secondary | ICD-10-CM | POA: Diagnosis not present

## 2020-11-15 DIAGNOSIS — M6281 Muscle weakness (generalized): Secondary | ICD-10-CM | POA: Diagnosis not present

## 2020-11-15 DIAGNOSIS — Z89512 Acquired absence of left leg below knee: Secondary | ICD-10-CM | POA: Diagnosis not present

## 2020-11-15 DIAGNOSIS — M5441 Lumbago with sciatica, right side: Secondary | ICD-10-CM | POA: Diagnosis not present

## 2020-11-15 DIAGNOSIS — E1140 Type 2 diabetes mellitus with diabetic neuropathy, unspecified: Secondary | ICD-10-CM | POA: Diagnosis not present

## 2020-11-15 DIAGNOSIS — M79604 Pain in right leg: Secondary | ICD-10-CM | POA: Diagnosis not present

## 2020-11-15 DIAGNOSIS — G547 Phantom limb syndrome without pain: Secondary | ICD-10-CM | POA: Diagnosis not present

## 2020-11-16 DIAGNOSIS — Z89512 Acquired absence of left leg below knee: Secondary | ICD-10-CM | POA: Diagnosis not present

## 2020-11-16 DIAGNOSIS — M6281 Muscle weakness (generalized): Secondary | ICD-10-CM | POA: Diagnosis not present

## 2020-11-17 DIAGNOSIS — M6281 Muscle weakness (generalized): Secondary | ICD-10-CM | POA: Diagnosis not present

## 2020-11-17 DIAGNOSIS — Z89512 Acquired absence of left leg below knee: Secondary | ICD-10-CM | POA: Diagnosis not present

## 2020-11-27 DIAGNOSIS — Z89512 Acquired absence of left leg below knee: Secondary | ICD-10-CM | POA: Diagnosis not present

## 2020-11-27 DIAGNOSIS — R600 Localized edema: Secondary | ICD-10-CM | POA: Diagnosis not present

## 2020-11-27 DIAGNOSIS — M79604 Pain in right leg: Secondary | ICD-10-CM | POA: Diagnosis not present

## 2020-11-27 DIAGNOSIS — K5900 Constipation, unspecified: Secondary | ICD-10-CM | POA: Diagnosis not present

## 2020-11-27 DIAGNOSIS — M5441 Lumbago with sciatica, right side: Secondary | ICD-10-CM | POA: Diagnosis not present

## 2020-12-07 DIAGNOSIS — N1832 Chronic kidney disease, stage 3b: Secondary | ICD-10-CM | POA: Diagnosis not present

## 2020-12-07 DIAGNOSIS — D649 Anemia, unspecified: Secondary | ICD-10-CM | POA: Diagnosis not present

## 2020-12-07 DIAGNOSIS — Z139 Encounter for screening, unspecified: Secondary | ICD-10-CM | POA: Diagnosis not present

## 2020-12-07 DIAGNOSIS — I10 Essential (primary) hypertension: Secondary | ICD-10-CM | POA: Diagnosis not present

## 2020-12-07 DIAGNOSIS — E11628 Type 2 diabetes mellitus with other skin complications: Secondary | ICD-10-CM | POA: Diagnosis not present

## 2020-12-24 DIAGNOSIS — I87311 Chronic venous hypertension (idiopathic) with ulcer of right lower extremity: Secondary | ICD-10-CM | POA: Diagnosis not present

## 2020-12-24 DIAGNOSIS — Z89512 Acquired absence of left leg below knee: Secondary | ICD-10-CM | POA: Diagnosis not present

## 2020-12-24 DIAGNOSIS — E1159 Type 2 diabetes mellitus with other circulatory complications: Secondary | ICD-10-CM | POA: Diagnosis not present

## 2020-12-24 DIAGNOSIS — R600 Localized edema: Secondary | ICD-10-CM | POA: Diagnosis not present

## 2020-12-24 DIAGNOSIS — I739 Peripheral vascular disease, unspecified: Secondary | ICD-10-CM | POA: Diagnosis not present

## 2020-12-28 DIAGNOSIS — I739 Peripheral vascular disease, unspecified: Secondary | ICD-10-CM | POA: Diagnosis not present

## 2020-12-28 DIAGNOSIS — R600 Localized edema: Secondary | ICD-10-CM | POA: Diagnosis not present

## 2020-12-31 DIAGNOSIS — Z89512 Acquired absence of left leg below knee: Secondary | ICD-10-CM | POA: Diagnosis not present

## 2020-12-31 DIAGNOSIS — L97811 Non-pressure chronic ulcer of other part of right lower leg limited to breakdown of skin: Secondary | ICD-10-CM | POA: Diagnosis not present

## 2020-12-31 DIAGNOSIS — M6281 Muscle weakness (generalized): Secondary | ICD-10-CM | POA: Diagnosis not present

## 2021-01-01 DIAGNOSIS — Z89512 Acquired absence of left leg below knee: Secondary | ICD-10-CM | POA: Diagnosis not present

## 2021-01-01 DIAGNOSIS — M6281 Muscle weakness (generalized): Secondary | ICD-10-CM | POA: Diagnosis not present

## 2021-01-02 DIAGNOSIS — E1159 Type 2 diabetes mellitus with other circulatory complications: Secondary | ICD-10-CM | POA: Diagnosis not present

## 2021-01-02 DIAGNOSIS — M6281 Muscle weakness (generalized): Secondary | ICD-10-CM | POA: Diagnosis not present

## 2021-01-02 DIAGNOSIS — I739 Peripheral vascular disease, unspecified: Secondary | ICD-10-CM | POA: Diagnosis not present

## 2021-01-02 DIAGNOSIS — I87311 Chronic venous hypertension (idiopathic) with ulcer of right lower extremity: Secondary | ICD-10-CM | POA: Diagnosis not present

## 2021-01-02 DIAGNOSIS — N1832 Chronic kidney disease, stage 3b: Secondary | ICD-10-CM | POA: Diagnosis not present

## 2021-01-02 DIAGNOSIS — Z89512 Acquired absence of left leg below knee: Secondary | ICD-10-CM | POA: Diagnosis not present

## 2021-01-03 DIAGNOSIS — R600 Localized edema: Secondary | ICD-10-CM | POA: Diagnosis not present

## 2021-01-03 DIAGNOSIS — E11622 Type 2 diabetes mellitus with other skin ulcer: Secondary | ICD-10-CM | POA: Diagnosis not present

## 2021-01-03 DIAGNOSIS — I739 Peripheral vascular disease, unspecified: Secondary | ICD-10-CM | POA: Diagnosis not present

## 2021-01-03 DIAGNOSIS — N179 Acute kidney failure, unspecified: Secondary | ICD-10-CM | POA: Diagnosis not present

## 2021-01-03 DIAGNOSIS — S81801D Unspecified open wound, right lower leg, subsequent encounter: Secondary | ICD-10-CM | POA: Diagnosis not present

## 2021-01-03 DIAGNOSIS — L97811 Non-pressure chronic ulcer of other part of right lower leg limited to breakdown of skin: Secondary | ICD-10-CM | POA: Diagnosis not present

## 2021-01-03 DIAGNOSIS — I70238 Atherosclerosis of native arteries of right leg with ulceration of other part of lower right leg: Secondary | ICD-10-CM | POA: Diagnosis not present

## 2021-01-03 DIAGNOSIS — N1832 Chronic kidney disease, stage 3b: Secondary | ICD-10-CM | POA: Diagnosis not present

## 2021-01-04 DIAGNOSIS — Z89512 Acquired absence of left leg below knee: Secondary | ICD-10-CM | POA: Diagnosis not present

## 2021-01-04 DIAGNOSIS — M6281 Muscle weakness (generalized): Secondary | ICD-10-CM | POA: Diagnosis not present

## 2021-01-05 DIAGNOSIS — Z4781 Encounter for orthopedic aftercare following surgical amputation: Secondary | ICD-10-CM | POA: Diagnosis not present

## 2021-01-05 DIAGNOSIS — M6281 Muscle weakness (generalized): Secondary | ICD-10-CM | POA: Diagnosis not present

## 2021-01-06 DIAGNOSIS — M6281 Muscle weakness (generalized): Secondary | ICD-10-CM | POA: Diagnosis not present

## 2021-01-06 DIAGNOSIS — Z4781 Encounter for orthopedic aftercare following surgical amputation: Secondary | ICD-10-CM | POA: Diagnosis not present

## 2021-01-07 DIAGNOSIS — E1165 Type 2 diabetes mellitus with hyperglycemia: Secondary | ICD-10-CM | POA: Diagnosis not present

## 2021-01-07 DIAGNOSIS — Z89512 Acquired absence of left leg below knee: Secondary | ICD-10-CM | POA: Diagnosis not present

## 2021-01-07 DIAGNOSIS — R600 Localized edema: Secondary | ICD-10-CM | POA: Diagnosis not present

## 2021-01-07 DIAGNOSIS — N1832 Chronic kidney disease, stage 3b: Secondary | ICD-10-CM | POA: Diagnosis not present

## 2021-01-07 DIAGNOSIS — Z4781 Encounter for orthopedic aftercare following surgical amputation: Secondary | ICD-10-CM | POA: Diagnosis not present

## 2021-01-07 DIAGNOSIS — M6281 Muscle weakness (generalized): Secondary | ICD-10-CM | POA: Diagnosis not present

## 2021-01-07 DIAGNOSIS — I739 Peripheral vascular disease, unspecified: Secondary | ICD-10-CM | POA: Diagnosis not present

## 2021-01-07 DIAGNOSIS — N179 Acute kidney failure, unspecified: Secondary | ICD-10-CM | POA: Diagnosis not present

## 2021-01-09 DIAGNOSIS — Z4781 Encounter for orthopedic aftercare following surgical amputation: Secondary | ICD-10-CM | POA: Diagnosis not present

## 2021-01-09 DIAGNOSIS — M6281 Muscle weakness (generalized): Secondary | ICD-10-CM | POA: Diagnosis not present

## 2021-01-11 DIAGNOSIS — M6281 Muscle weakness (generalized): Secondary | ICD-10-CM | POA: Diagnosis not present

## 2021-01-11 DIAGNOSIS — Z4781 Encounter for orthopedic aftercare following surgical amputation: Secondary | ICD-10-CM | POA: Diagnosis not present

## 2021-01-12 DIAGNOSIS — Z4781 Encounter for orthopedic aftercare following surgical amputation: Secondary | ICD-10-CM | POA: Diagnosis not present

## 2021-01-12 DIAGNOSIS — M6281 Muscle weakness (generalized): Secondary | ICD-10-CM | POA: Diagnosis not present

## 2021-01-13 DIAGNOSIS — Z4781 Encounter for orthopedic aftercare following surgical amputation: Secondary | ICD-10-CM | POA: Diagnosis not present

## 2021-01-13 DIAGNOSIS — M6281 Muscle weakness (generalized): Secondary | ICD-10-CM | POA: Diagnosis not present

## 2021-01-14 DIAGNOSIS — I872 Venous insufficiency (chronic) (peripheral): Secondary | ICD-10-CM | POA: Diagnosis not present

## 2021-01-14 DIAGNOSIS — E1159 Type 2 diabetes mellitus with other circulatory complications: Secondary | ICD-10-CM | POA: Diagnosis not present

## 2021-01-14 DIAGNOSIS — Z4781 Encounter for orthopedic aftercare following surgical amputation: Secondary | ICD-10-CM | POA: Diagnosis not present

## 2021-01-14 DIAGNOSIS — M6281 Muscle weakness (generalized): Secondary | ICD-10-CM | POA: Diagnosis not present

## 2021-01-14 DIAGNOSIS — I739 Peripheral vascular disease, unspecified: Secondary | ICD-10-CM | POA: Diagnosis not present

## 2021-01-15 DIAGNOSIS — R600 Localized edema: Secondary | ICD-10-CM | POA: Diagnosis not present

## 2021-01-15 DIAGNOSIS — E1165 Type 2 diabetes mellitus with hyperglycemia: Secondary | ICD-10-CM | POA: Diagnosis not present

## 2021-01-15 DIAGNOSIS — I739 Peripheral vascular disease, unspecified: Secondary | ICD-10-CM | POA: Diagnosis not present

## 2021-01-16 DIAGNOSIS — Z4781 Encounter for orthopedic aftercare following surgical amputation: Secondary | ICD-10-CM | POA: Diagnosis not present

## 2021-01-16 DIAGNOSIS — M6281 Muscle weakness (generalized): Secondary | ICD-10-CM | POA: Diagnosis not present

## 2021-01-18 DIAGNOSIS — M6281 Muscle weakness (generalized): Secondary | ICD-10-CM | POA: Diagnosis not present

## 2021-01-18 DIAGNOSIS — Z4781 Encounter for orthopedic aftercare following surgical amputation: Secondary | ICD-10-CM | POA: Diagnosis not present

## 2021-01-19 DIAGNOSIS — Z4781 Encounter for orthopedic aftercare following surgical amputation: Secondary | ICD-10-CM | POA: Diagnosis not present

## 2021-01-19 DIAGNOSIS — M6281 Muscle weakness (generalized): Secondary | ICD-10-CM | POA: Diagnosis not present

## 2021-01-20 DIAGNOSIS — Z4781 Encounter for orthopedic aftercare following surgical amputation: Secondary | ICD-10-CM | POA: Diagnosis not present

## 2021-01-20 DIAGNOSIS — M6281 Muscle weakness (generalized): Secondary | ICD-10-CM | POA: Diagnosis not present

## 2021-01-21 DIAGNOSIS — M6281 Muscle weakness (generalized): Secondary | ICD-10-CM | POA: Diagnosis not present

## 2021-01-21 DIAGNOSIS — Z4781 Encounter for orthopedic aftercare following surgical amputation: Secondary | ICD-10-CM | POA: Diagnosis not present

## 2021-01-22 DIAGNOSIS — Z4781 Encounter for orthopedic aftercare following surgical amputation: Secondary | ICD-10-CM | POA: Diagnosis not present

## 2021-01-22 DIAGNOSIS — M6281 Muscle weakness (generalized): Secondary | ICD-10-CM | POA: Diagnosis not present

## 2021-01-23 DIAGNOSIS — Z4781 Encounter for orthopedic aftercare following surgical amputation: Secondary | ICD-10-CM | POA: Diagnosis not present

## 2021-01-23 DIAGNOSIS — M6281 Muscle weakness (generalized): Secondary | ICD-10-CM | POA: Diagnosis not present

## 2021-01-24 DIAGNOSIS — Z4781 Encounter for orthopedic aftercare following surgical amputation: Secondary | ICD-10-CM | POA: Diagnosis not present

## 2021-01-24 DIAGNOSIS — M6281 Muscle weakness (generalized): Secondary | ICD-10-CM | POA: Diagnosis not present

## 2021-01-25 DIAGNOSIS — M6281 Muscle weakness (generalized): Secondary | ICD-10-CM | POA: Diagnosis not present

## 2021-01-25 DIAGNOSIS — Z4781 Encounter for orthopedic aftercare following surgical amputation: Secondary | ICD-10-CM | POA: Diagnosis not present

## 2021-01-26 DIAGNOSIS — M6281 Muscle weakness (generalized): Secondary | ICD-10-CM | POA: Diagnosis not present

## 2021-01-26 DIAGNOSIS — Z4781 Encounter for orthopedic aftercare following surgical amputation: Secondary | ICD-10-CM | POA: Diagnosis not present

## 2021-01-27 DIAGNOSIS — Z4781 Encounter for orthopedic aftercare following surgical amputation: Secondary | ICD-10-CM | POA: Diagnosis not present

## 2021-01-27 DIAGNOSIS — M6281 Muscle weakness (generalized): Secondary | ICD-10-CM | POA: Diagnosis not present

## 2021-01-28 DIAGNOSIS — Z4781 Encounter for orthopedic aftercare following surgical amputation: Secondary | ICD-10-CM | POA: Diagnosis not present

## 2021-01-28 DIAGNOSIS — M6281 Muscle weakness (generalized): Secondary | ICD-10-CM | POA: Diagnosis not present

## 2021-01-28 DIAGNOSIS — I87311 Chronic venous hypertension (idiopathic) with ulcer of right lower extremity: Secondary | ICD-10-CM | POA: Diagnosis not present

## 2021-01-28 DIAGNOSIS — I739 Peripheral vascular disease, unspecified: Secondary | ICD-10-CM | POA: Diagnosis not present

## 2021-01-28 DIAGNOSIS — L97818 Non-pressure chronic ulcer of other part of right lower leg with other specified severity: Secondary | ICD-10-CM | POA: Diagnosis not present

## 2021-01-28 DIAGNOSIS — E1140 Type 2 diabetes mellitus with diabetic neuropathy, unspecified: Secondary | ICD-10-CM | POA: Diagnosis not present

## 2021-01-29 DIAGNOSIS — M6281 Muscle weakness (generalized): Secondary | ICD-10-CM | POA: Diagnosis not present

## 2021-01-29 DIAGNOSIS — Z4781 Encounter for orthopedic aftercare following surgical amputation: Secondary | ICD-10-CM | POA: Diagnosis not present

## 2021-01-30 DIAGNOSIS — M6281 Muscle weakness (generalized): Secondary | ICD-10-CM | POA: Diagnosis not present

## 2021-01-30 DIAGNOSIS — Z4781 Encounter for orthopedic aftercare following surgical amputation: Secondary | ICD-10-CM | POA: Diagnosis not present

## 2021-01-31 DIAGNOSIS — Z4781 Encounter for orthopedic aftercare following surgical amputation: Secondary | ICD-10-CM | POA: Diagnosis not present

## 2021-01-31 DIAGNOSIS — M6281 Muscle weakness (generalized): Secondary | ICD-10-CM | POA: Diagnosis not present

## 2021-02-01 DIAGNOSIS — Z4781 Encounter for orthopedic aftercare following surgical amputation: Secondary | ICD-10-CM | POA: Diagnosis not present

## 2021-02-01 DIAGNOSIS — Z89512 Acquired absence of left leg below knee: Secondary | ICD-10-CM | POA: Diagnosis not present

## 2021-02-01 DIAGNOSIS — M6281 Muscle weakness (generalized): Secondary | ICD-10-CM | POA: Diagnosis not present

## 2021-02-01 DIAGNOSIS — M5441 Lumbago with sciatica, right side: Secondary | ICD-10-CM | POA: Diagnosis not present

## 2021-02-01 DIAGNOSIS — L97811 Non-pressure chronic ulcer of other part of right lower leg limited to breakdown of skin: Secondary | ICD-10-CM | POA: Diagnosis not present

## 2021-02-02 DIAGNOSIS — M6281 Muscle weakness (generalized): Secondary | ICD-10-CM | POA: Diagnosis not present

## 2021-02-02 DIAGNOSIS — Z4781 Encounter for orthopedic aftercare following surgical amputation: Secondary | ICD-10-CM | POA: Diagnosis not present

## 2021-02-03 DIAGNOSIS — Z4781 Encounter for orthopedic aftercare following surgical amputation: Secondary | ICD-10-CM | POA: Diagnosis not present

## 2021-02-03 DIAGNOSIS — M6281 Muscle weakness (generalized): Secondary | ICD-10-CM | POA: Diagnosis not present

## 2021-02-04 DIAGNOSIS — G546 Phantom limb syndrome with pain: Secondary | ICD-10-CM | POA: Diagnosis not present

## 2021-02-04 DIAGNOSIS — M6281 Muscle weakness (generalized): Secondary | ICD-10-CM | POA: Diagnosis not present

## 2021-02-04 DIAGNOSIS — M5441 Lumbago with sciatica, right side: Secondary | ICD-10-CM | POA: Diagnosis not present

## 2021-02-04 DIAGNOSIS — I87311 Chronic venous hypertension (idiopathic) with ulcer of right lower extremity: Secondary | ICD-10-CM | POA: Diagnosis not present

## 2021-02-04 DIAGNOSIS — N1832 Chronic kidney disease, stage 3b: Secondary | ICD-10-CM | POA: Diagnosis not present

## 2021-02-04 DIAGNOSIS — E1159 Type 2 diabetes mellitus with other circulatory complications: Secondary | ICD-10-CM | POA: Diagnosis not present

## 2021-02-04 DIAGNOSIS — E1142 Type 2 diabetes mellitus with diabetic polyneuropathy: Secondary | ICD-10-CM | POA: Diagnosis not present

## 2021-02-04 DIAGNOSIS — Z4781 Encounter for orthopedic aftercare following surgical amputation: Secondary | ICD-10-CM | POA: Diagnosis not present

## 2021-02-04 DIAGNOSIS — Z89512 Acquired absence of left leg below knee: Secondary | ICD-10-CM | POA: Diagnosis not present

## 2021-02-04 DIAGNOSIS — I739 Peripheral vascular disease, unspecified: Secondary | ICD-10-CM | POA: Diagnosis not present

## 2021-02-06 ENCOUNTER — Telehealth: Payer: Self-pay

## 2021-02-06 DIAGNOSIS — M1030 Gout due to renal impairment, unspecified site: Secondary | ICD-10-CM | POA: Diagnosis not present

## 2021-02-06 DIAGNOSIS — G8929 Other chronic pain: Secondary | ICD-10-CM | POA: Diagnosis not present

## 2021-02-06 DIAGNOSIS — Z4781 Encounter for orthopedic aftercare following surgical amputation: Secondary | ICD-10-CM | POA: Diagnosis not present

## 2021-02-06 DIAGNOSIS — I87311 Chronic venous hypertension (idiopathic) with ulcer of right lower extremity: Secondary | ICD-10-CM | POA: Diagnosis not present

## 2021-02-06 DIAGNOSIS — G546 Phantom limb syndrome with pain: Secondary | ICD-10-CM | POA: Diagnosis not present

## 2021-02-06 DIAGNOSIS — D631 Anemia in chronic kidney disease: Secondary | ICD-10-CM | POA: Diagnosis not present

## 2021-02-06 DIAGNOSIS — M109 Gout, unspecified: Secondary | ICD-10-CM | POA: Diagnosis not present

## 2021-02-06 DIAGNOSIS — E1142 Type 2 diabetes mellitus with diabetic polyneuropathy: Secondary | ICD-10-CM | POA: Diagnosis not present

## 2021-02-06 DIAGNOSIS — Z89411 Acquired absence of right great toe: Secondary | ICD-10-CM | POA: Diagnosis not present

## 2021-02-06 DIAGNOSIS — M5431 Sciatica, right side: Secondary | ICD-10-CM | POA: Diagnosis not present

## 2021-02-06 DIAGNOSIS — N1832 Chronic kidney disease, stage 3b: Secondary | ICD-10-CM | POA: Diagnosis not present

## 2021-02-06 DIAGNOSIS — M6281 Muscle weakness (generalized): Secondary | ICD-10-CM | POA: Diagnosis not present

## 2021-02-06 DIAGNOSIS — M1990 Unspecified osteoarthritis, unspecified site: Secondary | ICD-10-CM | POA: Diagnosis not present

## 2021-02-06 DIAGNOSIS — Z4800 Encounter for change or removal of nonsurgical wound dressing: Secondary | ICD-10-CM | POA: Diagnosis not present

## 2021-02-06 DIAGNOSIS — Z89512 Acquired absence of left leg below knee: Secondary | ICD-10-CM | POA: Diagnosis not present

## 2021-02-06 DIAGNOSIS — Z8616 Personal history of COVID-19: Secondary | ICD-10-CM | POA: Diagnosis not present

## 2021-02-06 DIAGNOSIS — Z87891 Personal history of nicotine dependence: Secondary | ICD-10-CM | POA: Diagnosis not present

## 2021-02-06 DIAGNOSIS — E1165 Type 2 diabetes mellitus with hyperglycemia: Secondary | ICD-10-CM | POA: Diagnosis not present

## 2021-02-06 DIAGNOSIS — Z794 Long term (current) use of insulin: Secondary | ICD-10-CM | POA: Diagnosis not present

## 2021-02-06 DIAGNOSIS — E785 Hyperlipidemia, unspecified: Secondary | ICD-10-CM | POA: Diagnosis not present

## 2021-02-06 DIAGNOSIS — L97811 Non-pressure chronic ulcer of other part of right lower leg limited to breakdown of skin: Secondary | ICD-10-CM | POA: Diagnosis not present

## 2021-02-06 DIAGNOSIS — E1122 Type 2 diabetes mellitus with diabetic chronic kidney disease: Secondary | ICD-10-CM | POA: Diagnosis not present

## 2021-02-06 DIAGNOSIS — I70201 Unspecified atherosclerosis of native arteries of extremities, right leg: Secondary | ICD-10-CM | POA: Diagnosis not present

## 2021-02-06 DIAGNOSIS — Z79891 Long term (current) use of opiate analgesic: Secondary | ICD-10-CM | POA: Diagnosis not present

## 2021-02-06 DIAGNOSIS — Z9181 History of falling: Secondary | ICD-10-CM | POA: Diagnosis not present

## 2021-02-06 DIAGNOSIS — E1151 Type 2 diabetes mellitus with diabetic peripheral angiopathy without gangrene: Secondary | ICD-10-CM | POA: Diagnosis not present

## 2021-02-06 DIAGNOSIS — I129 Hypertensive chronic kidney disease with stage 1 through stage 4 chronic kidney disease, or unspecified chronic kidney disease: Secondary | ICD-10-CM | POA: Diagnosis not present

## 2021-02-06 NOTE — Telephone Encounter (Signed)
Patient was with Ria Clock.    States due to transportation issues, she is not able to follow Vernona Rieger from [place] out to [facility].    Patient just got released from nursing facility.  Is requesting a TOC from [facility] to [place].  Please advise.

## 2021-02-07 DIAGNOSIS — L97811 Non-pressure chronic ulcer of other part of right lower leg limited to breakdown of skin: Secondary | ICD-10-CM | POA: Diagnosis not present

## 2021-02-07 DIAGNOSIS — G546 Phantom limb syndrome with pain: Secondary | ICD-10-CM | POA: Diagnosis not present

## 2021-02-07 DIAGNOSIS — Z87891 Personal history of nicotine dependence: Secondary | ICD-10-CM | POA: Diagnosis not present

## 2021-02-07 DIAGNOSIS — D631 Anemia in chronic kidney disease: Secondary | ICD-10-CM | POA: Diagnosis not present

## 2021-02-07 DIAGNOSIS — M5431 Sciatica, right side: Secondary | ICD-10-CM | POA: Diagnosis not present

## 2021-02-07 DIAGNOSIS — Z9181 History of falling: Secondary | ICD-10-CM | POA: Diagnosis not present

## 2021-02-07 DIAGNOSIS — Z4781 Encounter for orthopedic aftercare following surgical amputation: Secondary | ICD-10-CM | POA: Diagnosis not present

## 2021-02-07 DIAGNOSIS — G8929 Other chronic pain: Secondary | ICD-10-CM | POA: Diagnosis not present

## 2021-02-07 DIAGNOSIS — Z8616 Personal history of COVID-19: Secondary | ICD-10-CM | POA: Diagnosis not present

## 2021-02-07 DIAGNOSIS — E1165 Type 2 diabetes mellitus with hyperglycemia: Secondary | ICD-10-CM | POA: Diagnosis not present

## 2021-02-07 DIAGNOSIS — N1832 Chronic kidney disease, stage 3b: Secondary | ICD-10-CM | POA: Diagnosis not present

## 2021-02-07 DIAGNOSIS — Z89411 Acquired absence of right great toe: Secondary | ICD-10-CM | POA: Diagnosis not present

## 2021-02-07 DIAGNOSIS — I70201 Unspecified atherosclerosis of native arteries of extremities, right leg: Secondary | ICD-10-CM | POA: Diagnosis not present

## 2021-02-07 DIAGNOSIS — Z4800 Encounter for change or removal of nonsurgical wound dressing: Secondary | ICD-10-CM | POA: Diagnosis not present

## 2021-02-07 DIAGNOSIS — Z89512 Acquired absence of left leg below knee: Secondary | ICD-10-CM | POA: Diagnosis not present

## 2021-02-07 DIAGNOSIS — E1122 Type 2 diabetes mellitus with diabetic chronic kidney disease: Secondary | ICD-10-CM | POA: Diagnosis not present

## 2021-02-07 DIAGNOSIS — M1030 Gout due to renal impairment, unspecified site: Secondary | ICD-10-CM | POA: Diagnosis not present

## 2021-02-07 DIAGNOSIS — I87311 Chronic venous hypertension (idiopathic) with ulcer of right lower extremity: Secondary | ICD-10-CM | POA: Diagnosis not present

## 2021-02-07 DIAGNOSIS — I129 Hypertensive chronic kidney disease with stage 1 through stage 4 chronic kidney disease, or unspecified chronic kidney disease: Secondary | ICD-10-CM | POA: Diagnosis not present

## 2021-02-07 DIAGNOSIS — E1142 Type 2 diabetes mellitus with diabetic polyneuropathy: Secondary | ICD-10-CM | POA: Diagnosis not present

## 2021-02-07 DIAGNOSIS — Z79891 Long term (current) use of opiate analgesic: Secondary | ICD-10-CM | POA: Diagnosis not present

## 2021-02-07 DIAGNOSIS — E785 Hyperlipidemia, unspecified: Secondary | ICD-10-CM | POA: Diagnosis not present

## 2021-02-07 DIAGNOSIS — Z794 Long term (current) use of insulin: Secondary | ICD-10-CM | POA: Diagnosis not present

## 2021-02-07 DIAGNOSIS — E1151 Type 2 diabetes mellitus with diabetic peripheral angiopathy without gangrene: Secondary | ICD-10-CM | POA: Diagnosis not present

## 2021-02-07 DIAGNOSIS — M1990 Unspecified osteoarthritis, unspecified site: Secondary | ICD-10-CM | POA: Diagnosis not present

## 2021-02-08 ENCOUNTER — Telehealth: Payer: Self-pay | Admitting: Family

## 2021-02-08 DIAGNOSIS — L97811 Non-pressure chronic ulcer of other part of right lower leg limited to breakdown of skin: Secondary | ICD-10-CM | POA: Diagnosis not present

## 2021-02-08 DIAGNOSIS — Z9181 History of falling: Secondary | ICD-10-CM | POA: Diagnosis not present

## 2021-02-08 DIAGNOSIS — I70201 Unspecified atherosclerosis of native arteries of extremities, right leg: Secondary | ICD-10-CM | POA: Diagnosis not present

## 2021-02-08 DIAGNOSIS — M1990 Unspecified osteoarthritis, unspecified site: Secondary | ICD-10-CM | POA: Diagnosis not present

## 2021-02-08 DIAGNOSIS — Z79891 Long term (current) use of opiate analgesic: Secondary | ICD-10-CM | POA: Diagnosis not present

## 2021-02-08 DIAGNOSIS — G546 Phantom limb syndrome with pain: Secondary | ICD-10-CM | POA: Diagnosis not present

## 2021-02-08 DIAGNOSIS — I87311 Chronic venous hypertension (idiopathic) with ulcer of right lower extremity: Secondary | ICD-10-CM | POA: Diagnosis not present

## 2021-02-08 DIAGNOSIS — E1165 Type 2 diabetes mellitus with hyperglycemia: Secondary | ICD-10-CM | POA: Diagnosis not present

## 2021-02-08 DIAGNOSIS — Z8616 Personal history of COVID-19: Secondary | ICD-10-CM | POA: Diagnosis not present

## 2021-02-08 DIAGNOSIS — Z87891 Personal history of nicotine dependence: Secondary | ICD-10-CM | POA: Diagnosis not present

## 2021-02-08 DIAGNOSIS — M1030 Gout due to renal impairment, unspecified site: Secondary | ICD-10-CM | POA: Diagnosis not present

## 2021-02-08 DIAGNOSIS — Z4800 Encounter for change or removal of nonsurgical wound dressing: Secondary | ICD-10-CM | POA: Diagnosis not present

## 2021-02-08 DIAGNOSIS — E1122 Type 2 diabetes mellitus with diabetic chronic kidney disease: Secondary | ICD-10-CM | POA: Diagnosis not present

## 2021-02-08 DIAGNOSIS — E1142 Type 2 diabetes mellitus with diabetic polyneuropathy: Secondary | ICD-10-CM | POA: Diagnosis not present

## 2021-02-08 DIAGNOSIS — Z4781 Encounter for orthopedic aftercare following surgical amputation: Secondary | ICD-10-CM | POA: Diagnosis not present

## 2021-02-08 DIAGNOSIS — E1151 Type 2 diabetes mellitus with diabetic peripheral angiopathy without gangrene: Secondary | ICD-10-CM | POA: Diagnosis not present

## 2021-02-08 DIAGNOSIS — E785 Hyperlipidemia, unspecified: Secondary | ICD-10-CM | POA: Diagnosis not present

## 2021-02-08 DIAGNOSIS — Z89512 Acquired absence of left leg below knee: Secondary | ICD-10-CM | POA: Diagnosis not present

## 2021-02-08 DIAGNOSIS — D631 Anemia in chronic kidney disease: Secondary | ICD-10-CM | POA: Diagnosis not present

## 2021-02-08 DIAGNOSIS — Z794 Long term (current) use of insulin: Secondary | ICD-10-CM | POA: Diagnosis not present

## 2021-02-08 DIAGNOSIS — G8929 Other chronic pain: Secondary | ICD-10-CM | POA: Diagnosis not present

## 2021-02-08 DIAGNOSIS — I129 Hypertensive chronic kidney disease with stage 1 through stage 4 chronic kidney disease, or unspecified chronic kidney disease: Secondary | ICD-10-CM | POA: Diagnosis not present

## 2021-02-08 DIAGNOSIS — Z89411 Acquired absence of right great toe: Secondary | ICD-10-CM | POA: Diagnosis not present

## 2021-02-08 DIAGNOSIS — M5431 Sciatica, right side: Secondary | ICD-10-CM | POA: Diagnosis not present

## 2021-02-08 DIAGNOSIS — N1832 Chronic kidney disease, stage 3b: Secondary | ICD-10-CM | POA: Diagnosis not present

## 2021-02-08 NOTE — Telephone Encounter (Signed)
Caller/Agency: [facility]: Karle Plumber Number: [phone] Requesting OT/PT/Skilled Nursing/Social Work/Speech Therapy: PT Frequency:  1x week for 1 week 2x week for 2weeks 1x week for 1week 2x weeks for 4weeks 1x week for 1week

## 2021-02-08 NOTE — Telephone Encounter (Signed)
I have called [facility] to give verbal order. Not able to give verbal on vm.

## 2021-02-11 DIAGNOSIS — Z4800 Encounter for change or removal of nonsurgical wound dressing: Secondary | ICD-10-CM | POA: Diagnosis not present

## 2021-02-11 DIAGNOSIS — I70201 Unspecified atherosclerosis of native arteries of extremities, right leg: Secondary | ICD-10-CM | POA: Diagnosis not present

## 2021-02-11 DIAGNOSIS — Z9181 History of falling: Secondary | ICD-10-CM | POA: Diagnosis not present

## 2021-02-11 DIAGNOSIS — Z89411 Acquired absence of right great toe: Secondary | ICD-10-CM | POA: Diagnosis not present

## 2021-02-11 DIAGNOSIS — E1122 Type 2 diabetes mellitus with diabetic chronic kidney disease: Secondary | ICD-10-CM | POA: Diagnosis not present

## 2021-02-11 DIAGNOSIS — E1151 Type 2 diabetes mellitus with diabetic peripheral angiopathy without gangrene: Secondary | ICD-10-CM | POA: Diagnosis not present

## 2021-02-11 DIAGNOSIS — G546 Phantom limb syndrome with pain: Secondary | ICD-10-CM | POA: Diagnosis not present

## 2021-02-11 DIAGNOSIS — I87311 Chronic venous hypertension (idiopathic) with ulcer of right lower extremity: Secondary | ICD-10-CM | POA: Diagnosis not present

## 2021-02-11 DIAGNOSIS — Z8616 Personal history of COVID-19: Secondary | ICD-10-CM | POA: Diagnosis not present

## 2021-02-11 DIAGNOSIS — Z87891 Personal history of nicotine dependence: Secondary | ICD-10-CM | POA: Diagnosis not present

## 2021-02-11 DIAGNOSIS — Z4781 Encounter for orthopedic aftercare following surgical amputation: Secondary | ICD-10-CM | POA: Diagnosis not present

## 2021-02-11 DIAGNOSIS — M1030 Gout due to renal impairment, unspecified site: Secondary | ICD-10-CM | POA: Diagnosis not present

## 2021-02-11 DIAGNOSIS — M5431 Sciatica, right side: Secondary | ICD-10-CM | POA: Diagnosis not present

## 2021-02-11 DIAGNOSIS — I129 Hypertensive chronic kidney disease with stage 1 through stage 4 chronic kidney disease, or unspecified chronic kidney disease: Secondary | ICD-10-CM | POA: Diagnosis not present

## 2021-02-11 DIAGNOSIS — E1142 Type 2 diabetes mellitus with diabetic polyneuropathy: Secondary | ICD-10-CM | POA: Diagnosis not present

## 2021-02-11 DIAGNOSIS — Z794 Long term (current) use of insulin: Secondary | ICD-10-CM | POA: Diagnosis not present

## 2021-02-11 DIAGNOSIS — E785 Hyperlipidemia, unspecified: Secondary | ICD-10-CM | POA: Diagnosis not present

## 2021-02-11 DIAGNOSIS — M1990 Unspecified osteoarthritis, unspecified site: Secondary | ICD-10-CM | POA: Diagnosis not present

## 2021-02-11 DIAGNOSIS — Z89512 Acquired absence of left leg below knee: Secondary | ICD-10-CM | POA: Diagnosis not present

## 2021-02-11 DIAGNOSIS — D631 Anemia in chronic kidney disease: Secondary | ICD-10-CM | POA: Diagnosis not present

## 2021-02-11 DIAGNOSIS — L97811 Non-pressure chronic ulcer of other part of right lower leg limited to breakdown of skin: Secondary | ICD-10-CM | POA: Diagnosis not present

## 2021-02-11 DIAGNOSIS — Z79891 Long term (current) use of opiate analgesic: Secondary | ICD-10-CM | POA: Diagnosis not present

## 2021-02-11 DIAGNOSIS — N1832 Chronic kidney disease, stage 3b: Secondary | ICD-10-CM | POA: Diagnosis not present

## 2021-02-11 DIAGNOSIS — E1165 Type 2 diabetes mellitus with hyperglycemia: Secondary | ICD-10-CM | POA: Diagnosis not present

## 2021-02-11 DIAGNOSIS — G8929 Other chronic pain: Secondary | ICD-10-CM | POA: Diagnosis not present

## 2021-02-12 DIAGNOSIS — Z794 Long term (current) use of insulin: Secondary | ICD-10-CM | POA: Diagnosis not present

## 2021-02-12 DIAGNOSIS — M1030 Gout due to renal impairment, unspecified site: Secondary | ICD-10-CM | POA: Diagnosis not present

## 2021-02-12 DIAGNOSIS — D631 Anemia in chronic kidney disease: Secondary | ICD-10-CM | POA: Diagnosis not present

## 2021-02-12 DIAGNOSIS — Z9181 History of falling: Secondary | ICD-10-CM | POA: Diagnosis not present

## 2021-02-12 DIAGNOSIS — G8929 Other chronic pain: Secondary | ICD-10-CM | POA: Diagnosis not present

## 2021-02-12 DIAGNOSIS — I70201 Unspecified atherosclerosis of native arteries of extremities, right leg: Secondary | ICD-10-CM | POA: Diagnosis not present

## 2021-02-12 DIAGNOSIS — G546 Phantom limb syndrome with pain: Secondary | ICD-10-CM | POA: Diagnosis not present

## 2021-02-12 DIAGNOSIS — I129 Hypertensive chronic kidney disease with stage 1 through stage 4 chronic kidney disease, or unspecified chronic kidney disease: Secondary | ICD-10-CM | POA: Diagnosis not present

## 2021-02-12 DIAGNOSIS — E785 Hyperlipidemia, unspecified: Secondary | ICD-10-CM | POA: Diagnosis not present

## 2021-02-12 DIAGNOSIS — M1990 Unspecified osteoarthritis, unspecified site: Secondary | ICD-10-CM | POA: Diagnosis not present

## 2021-02-12 DIAGNOSIS — Z8616 Personal history of COVID-19: Secondary | ICD-10-CM | POA: Diagnosis not present

## 2021-02-12 DIAGNOSIS — I87311 Chronic venous hypertension (idiopathic) with ulcer of right lower extremity: Secondary | ICD-10-CM | POA: Diagnosis not present

## 2021-02-12 DIAGNOSIS — E1165 Type 2 diabetes mellitus with hyperglycemia: Secondary | ICD-10-CM | POA: Diagnosis not present

## 2021-02-12 DIAGNOSIS — Z79891 Long term (current) use of opiate analgesic: Secondary | ICD-10-CM | POA: Diagnosis not present

## 2021-02-12 DIAGNOSIS — N1832 Chronic kidney disease, stage 3b: Secondary | ICD-10-CM | POA: Diagnosis not present

## 2021-02-12 DIAGNOSIS — Z4781 Encounter for orthopedic aftercare following surgical amputation: Secondary | ICD-10-CM | POA: Diagnosis not present

## 2021-02-12 DIAGNOSIS — E1122 Type 2 diabetes mellitus with diabetic chronic kidney disease: Secondary | ICD-10-CM | POA: Diagnosis not present

## 2021-02-12 DIAGNOSIS — Z87891 Personal history of nicotine dependence: Secondary | ICD-10-CM | POA: Diagnosis not present

## 2021-02-12 DIAGNOSIS — Z89512 Acquired absence of left leg below knee: Secondary | ICD-10-CM | POA: Diagnosis not present

## 2021-02-12 DIAGNOSIS — E1142 Type 2 diabetes mellitus with diabetic polyneuropathy: Secondary | ICD-10-CM | POA: Diagnosis not present

## 2021-02-12 DIAGNOSIS — E1151 Type 2 diabetes mellitus with diabetic peripheral angiopathy without gangrene: Secondary | ICD-10-CM | POA: Diagnosis not present

## 2021-02-12 DIAGNOSIS — Z4800 Encounter for change or removal of nonsurgical wound dressing: Secondary | ICD-10-CM | POA: Diagnosis not present

## 2021-02-12 DIAGNOSIS — M5431 Sciatica, right side: Secondary | ICD-10-CM | POA: Diagnosis not present

## 2021-02-12 DIAGNOSIS — L97811 Non-pressure chronic ulcer of other part of right lower leg limited to breakdown of skin: Secondary | ICD-10-CM | POA: Diagnosis not present

## 2021-02-12 DIAGNOSIS — Z89411 Acquired absence of right great toe: Secondary | ICD-10-CM | POA: Diagnosis not present

## 2021-02-12 NOTE — Telephone Encounter (Signed)
I have given [facility] a call at [phone] to give verbal orders. No answer and unable to leave VM.

## 2021-02-12 NOTE — Telephone Encounter (Signed)
Left patient vm to call back to schedule.  °

## 2021-02-13 ENCOUNTER — Emergency Department (HOSPITAL_COMMUNITY): Payer: Medicare Other

## 2021-02-13 ENCOUNTER — Inpatient Hospital Stay (HOSPITAL_COMMUNITY)
Admission: EM | Admit: 2021-02-13 | Discharge: 2021-02-20 | DRG: 683 | Disposition: A | Discharge status: Skilled Nursing Facility | Payer: Medicare Other | Attending: Family Medicine | Admitting: Family Medicine

## 2021-02-13 ENCOUNTER — Encounter (HOSPITAL_COMMUNITY): Payer: Self-pay | Admitting: Emergency Medicine

## 2021-02-13 ENCOUNTER — Other Ambulatory Visit: Payer: Self-pay

## 2021-02-13 DIAGNOSIS — R2689 Other abnormalities of gait and mobility: Secondary | ICD-10-CM | POA: Diagnosis not present

## 2021-02-13 DIAGNOSIS — I5032 Chronic diastolic (congestive) heart failure: Secondary | ICD-10-CM | POA: Diagnosis present

## 2021-02-13 DIAGNOSIS — I70202 Unspecified atherosclerosis of native arteries of extremities, left leg: Secondary | ICD-10-CM | POA: Diagnosis present

## 2021-02-13 DIAGNOSIS — R0902 Hypoxemia: Secondary | ICD-10-CM | POA: Diagnosis not present

## 2021-02-13 DIAGNOSIS — Z743 Need for continuous supervision: Secondary | ICD-10-CM | POA: Diagnosis not present

## 2021-02-13 DIAGNOSIS — M19042 Primary osteoarthritis, left hand: Secondary | ICD-10-CM | POA: Diagnosis present

## 2021-02-13 DIAGNOSIS — G8929 Other chronic pain: Secondary | ICD-10-CM | POA: Diagnosis not present

## 2021-02-13 DIAGNOSIS — M19041 Primary osteoarthritis, right hand: Secondary | ICD-10-CM | POA: Diagnosis present

## 2021-02-13 DIAGNOSIS — J101 Influenza due to other identified influenza virus with other respiratory manifestations: Secondary | ICD-10-CM | POA: Diagnosis present

## 2021-02-13 DIAGNOSIS — I152 Hypertension secondary to endocrine disorders: Secondary | ICD-10-CM | POA: Diagnosis not present

## 2021-02-13 DIAGNOSIS — R42 Dizziness and giddiness: Secondary | ICD-10-CM | POA: Diagnosis present

## 2021-02-13 DIAGNOSIS — I739 Peripheral vascular disease, unspecified: Secondary | ICD-10-CM | POA: Diagnosis present

## 2021-02-13 DIAGNOSIS — T500X5A Adverse effect of mineralocorticoids and their antagonists, initial encounter: Secondary | ICD-10-CM | POA: Diagnosis not present

## 2021-02-13 DIAGNOSIS — Z66 Do not resuscitate: Secondary | ICD-10-CM | POA: Diagnosis present

## 2021-02-13 DIAGNOSIS — R778 Other specified abnormalities of plasma proteins: Secondary | ICD-10-CM | POA: Diagnosis not present

## 2021-02-13 DIAGNOSIS — T501X5A Adverse effect of loop [high-ceiling] diuretics, initial encounter: Secondary | ICD-10-CM | POA: Diagnosis present

## 2021-02-13 DIAGNOSIS — R278 Other lack of coordination: Secondary | ICD-10-CM | POA: Diagnosis not present

## 2021-02-13 DIAGNOSIS — Z20822 Contact with and (suspected) exposure to covid-19: Secondary | ICD-10-CM | POA: Diagnosis present

## 2021-02-13 DIAGNOSIS — E860 Dehydration: Secondary | ICD-10-CM | POA: Diagnosis not present

## 2021-02-13 DIAGNOSIS — E1169 Type 2 diabetes mellitus with other specified complication: Secondary | ICD-10-CM | POA: Diagnosis not present

## 2021-02-13 DIAGNOSIS — E1151 Type 2 diabetes mellitus with diabetic peripheral angiopathy without gangrene: Secondary | ICD-10-CM | POA: Diagnosis not present

## 2021-02-13 DIAGNOSIS — E1159 Type 2 diabetes mellitus with other circulatory complications: Secondary | ICD-10-CM | POA: Diagnosis not present

## 2021-02-13 DIAGNOSIS — Z89512 Acquired absence of left leg below knee: Secondary | ICD-10-CM

## 2021-02-13 DIAGNOSIS — Z801 Family history of malignant neoplasm of trachea, bronchus and lung: Secondary | ICD-10-CM | POA: Diagnosis not present

## 2021-02-13 DIAGNOSIS — M2550 Pain in unspecified joint: Secondary | ICD-10-CM | POA: Diagnosis not present

## 2021-02-13 DIAGNOSIS — Z8249 Family history of ischemic heart disease and other diseases of the circulatory system: Secondary | ICD-10-CM | POA: Diagnosis not present

## 2021-02-13 DIAGNOSIS — Z8349 Family history of other endocrine, nutritional and metabolic diseases: Secondary | ICD-10-CM

## 2021-02-13 DIAGNOSIS — Z7401 Bed confinement status: Secondary | ICD-10-CM | POA: Diagnosis not present

## 2021-02-13 DIAGNOSIS — Z794 Long term (current) use of insulin: Secondary | ICD-10-CM | POA: Diagnosis not present

## 2021-02-13 DIAGNOSIS — R531 Weakness: Secondary | ICD-10-CM

## 2021-02-13 DIAGNOSIS — Z833 Family history of diabetes mellitus: Secondary | ICD-10-CM

## 2021-02-13 DIAGNOSIS — E785 Hyperlipidemia, unspecified: Secondary | ICD-10-CM | POA: Diagnosis present

## 2021-02-13 DIAGNOSIS — Z87891 Personal history of nicotine dependence: Secondary | ICD-10-CM

## 2021-02-13 DIAGNOSIS — N1330 Unspecified hydronephrosis: Secondary | ICD-10-CM | POA: Diagnosis present

## 2021-02-13 DIAGNOSIS — Z993 Dependence on wheelchair: Secondary | ICD-10-CM

## 2021-02-13 DIAGNOSIS — M5431 Sciatica, right side: Secondary | ICD-10-CM | POA: Diagnosis present

## 2021-02-13 DIAGNOSIS — N179 Acute kidney failure, unspecified: Principal | ICD-10-CM | POA: Diagnosis present

## 2021-02-13 LAB — URINALYSIS, ROUTINE W REFLEX MICROSCOPIC
Bacteria, UA: NONE SEEN
Bilirubin Urine: NEGATIVE
Glucose, UA: NEGATIVE mg/dL
Ketones, ur: 5 mg/dL — AB
Leukocytes,Ua: NEGATIVE
Nitrite: NEGATIVE
Protein, ur: 30 mg/dL — AB
Specific Gravity, Urine: 1.014 (ref 1.005–1.030)
pH: 5 (ref 5.0–8.0)

## 2021-02-13 LAB — CBC WITH DIFFERENTIAL/PLATELET
Abs Immature Granulocytes: 0.03 10*3/uL (ref 0.00–0.07)
Basophils Absolute: 0 10*3/uL (ref 0.0–0.1)
Basophils Relative: 0 %
Eosinophils Absolute: 0 10*3/uL (ref 0.0–0.5)
Eosinophils Relative: 0 %
HCT: 37.3 % (ref 36.0–46.0)
Hemoglobin: 12 g/dL (ref 12.0–15.0)
Immature Granulocytes: 0 %
Lymphocytes Relative: 11 %
Lymphs Abs: 0.8 10*3/uL (ref 0.7–4.0)
MCH: 29.9 pg (ref 26.0–34.0)
MCHC: 32.2 g/dL (ref 30.0–36.0)
MCV: 93 fL (ref 80.0–100.0)
Monocytes Absolute: 0.9 10*3/uL (ref 0.1–1.0)
Monocytes Relative: 12 %
Neutro Abs: 5.2 10*3/uL (ref 1.7–7.7)
Neutrophils Relative %: 77 %
Platelets: 235 10*3/uL (ref 150–400)
RBC: 4.01 MIL/uL (ref 3.87–5.11)
RDW: 13.9 % (ref 11.5–15.5)
WBC: 6.9 10*3/uL (ref 4.0–10.5)
nRBC: 0 % (ref 0.0–0.2)

## 2021-02-13 LAB — CREATININE, URINE, RANDOM: Creatinine, Urine: 114.35 mg/dL

## 2021-02-13 LAB — BASIC METABOLIC PANEL
Anion gap: 10 (ref 5–15)
BUN: 53 mg/dL — ABNORMAL HIGH (ref 8–23)
CO2: 25 mmol/L (ref 22–32)
Calcium: 8.7 mg/dL — ABNORMAL LOW (ref 8.9–10.3)
Chloride: 105 mmol/L (ref 98–111)
Creatinine, Ser: 1.98 mg/dL — ABNORMAL HIGH (ref 0.44–1.00)
GFR, Estimated: 28 mL/min — ABNORMAL LOW (ref >60)
Glucose, Bld: 181 mg/dL — ABNORMAL HIGH (ref 70–99)
Potassium: 4.8 mmol/L (ref 3.5–5.1)
Sodium: 140 mmol/L (ref 135–145)

## 2021-02-13 LAB — RESP PANEL BY RT-PCR (FLU A&B, COVID) ARPGX2
Influenza A by PCR: POSITIVE — AB
Influenza B by PCR: NEGATIVE
SARS Coronavirus 2 by RT PCR: NEGATIVE

## 2021-02-13 LAB — SODIUM, URINE, RANDOM: Sodium, Ur: 39 mmol/L

## 2021-02-13 LAB — LACTIC ACID, PLASMA: Lactic Acid, Venous: 1.5 mmol/L (ref 0.5–1.9)

## 2021-02-13 LAB — TROPONIN I (HIGH SENSITIVITY)
Troponin I (High Sensitivity): 23 ng/L — ABNORMAL HIGH (ref <18)
Troponin I (High Sensitivity): 28 ng/L — ABNORMAL HIGH (ref <18)

## 2021-02-13 LAB — CK: Total CK: 401 U/L — ABNORMAL HIGH (ref 38–234)

## 2021-02-13 LAB — MAGNESIUM: Magnesium: 2.3 mg/dL (ref 1.7–2.4)

## 2021-02-13 MED ORDER — INSULIN GLARGINE-YFGN 100 UNIT/ML ~~LOC~~ SOLN
15.0000 [IU] | Freq: Every day | SUBCUTANEOUS | Status: DC
  Administered 2021-02-14 – 2021-02-16 (×4): 15 [IU] via SUBCUTANEOUS
  Filled 2021-02-13 (×4): qty 0.15

## 2021-02-13 MED ORDER — ONDANSETRON HCL 4 MG/2ML IJ SOLN: 4.0000 mg | Freq: Four times a day (QID) | INTRAMUSCULAR | Status: DC | PRN

## 2021-02-13 MED ORDER — ACETAMINOPHEN 650 MG RE SUPP: 650.0000 mg | Freq: Four times a day (QID) | RECTAL | Status: DC | PRN

## 2021-02-13 MED ORDER — SODIUM CHLORIDE 0.9 % IV BOLUS
1000.0000 mL | Freq: Once | INTRAVENOUS | Status: AC
  Administered 2021-02-13: 1000 mL via INTRAVENOUS

## 2021-02-13 MED ORDER — INSULIN ASPART 100 UNIT/ML IJ SOLN
0.0000 [IU] | Freq: Three times a day (TID) | INTRAMUSCULAR | Status: DC
  Administered 2021-02-16: 3 [IU] via SUBCUTANEOUS
  Administered 2021-02-17 (×3): 2 [IU] via SUBCUTANEOUS
  Administered 2021-02-18 (×2): 1 [IU] via SUBCUTANEOUS
  Administered 2021-02-19: 2 [IU] via SUBCUTANEOUS
  Administered 2021-02-19: 1 [IU] via SUBCUTANEOUS
  Administered 2021-02-20 (×2): 2 [IU] via SUBCUTANEOUS
  Filled 2021-02-13: qty 0.09

## 2021-02-13 MED ORDER — SENNOSIDES-DOCUSATE SODIUM 8.6-50 MG PO TABS
1.0000 | ORAL_TABLET | Freq: Every evening | ORAL | Status: DC | PRN
  Administered 2021-02-17: 1 via ORAL
  Filled 2021-02-13: qty 1

## 2021-02-13 MED ORDER — SODIUM CHLORIDE 0.9 % IV SOLN: INTRAVENOUS | Status: DC

## 2021-02-13 MED ORDER — ACETAMINOPHEN 325 MG PO TABS
650.0000 mg | ORAL_TABLET | Freq: Four times a day (QID) | ORAL | Status: DC | PRN
  Administered 2021-02-14 – 2021-02-15 (×2): 650 mg via ORAL
  Filled 2021-02-13 (×2): qty 2

## 2021-02-13 MED ORDER — ONDANSETRON HCL 4 MG PO TABS: 4.0000 mg | ORAL_TABLET | Freq: Four times a day (QID) | ORAL | Status: DC | PRN

## 2021-02-13 MED ORDER — OXYCODONE-ACETAMINOPHEN 5-325 MG PO TABS
1.0000 | ORAL_TABLET | Freq: Once | ORAL | Status: AC
  Administered 2021-02-13: 1 via ORAL
  Filled 2021-02-13: qty 1

## 2021-02-13 MED ORDER — HEPARIN SODIUM (PORCINE) 5000 UNIT/ML IJ SOLN
5000.0000 [IU] | Freq: Three times a day (TID) | INTRAMUSCULAR | Status: DC
  Administered 2021-02-14 – 2021-02-20 (×20): 5000 [IU] via SUBCUTANEOUS
  Filled 2021-02-13 (×20): qty 1

## 2021-02-13 NOTE — ED Notes (Signed)
Called [facility] and requested purple man.

## 2021-02-13 NOTE — ED Triage Notes (Signed)
Per EMS-patient currently discharged from rehab 2 days ago-came home to live with son, son works nights and came home to find mother on toilet, states she had been there for 10 hours-has not had pain meds all day-increased weakness, unable to care for self
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
## 2021-02-13 NOTE — ED Notes (Signed)
This RN has been working as an NT and is reporting off to the new NT.  Thank you for your patience.
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
## 2021-02-14 ENCOUNTER — Encounter (HOSPITAL_COMMUNITY): Payer: Self-pay | Admitting: Internal Medicine

## 2021-02-14 ENCOUNTER — Observation Stay (HOSPITAL_COMMUNITY): Payer: Medicare Other

## 2021-02-14 DIAGNOSIS — N179 Acute kidney failure, unspecified: Secondary | ICD-10-CM | POA: Diagnosis present

## 2021-02-14 DIAGNOSIS — E785 Hyperlipidemia, unspecified: Secondary | ICD-10-CM

## 2021-02-14 DIAGNOSIS — J101 Influenza due to other identified influenza virus with other respiratory manifestations: Secondary | ICD-10-CM | POA: Diagnosis present

## 2021-02-14 DIAGNOSIS — Z66 Do not resuscitate: Secondary | ICD-10-CM | POA: Diagnosis present

## 2021-02-14 DIAGNOSIS — M5431 Sciatica, right side: Secondary | ICD-10-CM | POA: Diagnosis present

## 2021-02-14 DIAGNOSIS — T501X5A Adverse effect of loop [high-ceiling] diuretics, initial encounter: Secondary | ICD-10-CM | POA: Diagnosis present

## 2021-02-14 DIAGNOSIS — Z833 Family history of diabetes mellitus: Secondary | ICD-10-CM | POA: Diagnosis not present

## 2021-02-14 DIAGNOSIS — E1151 Type 2 diabetes mellitus with diabetic peripheral angiopathy without gangrene: Secondary | ICD-10-CM | POA: Diagnosis present

## 2021-02-14 DIAGNOSIS — I152 Hypertension secondary to endocrine disorders: Secondary | ICD-10-CM | POA: Diagnosis present

## 2021-02-14 DIAGNOSIS — E1169 Type 2 diabetes mellitus with other specified complication: Secondary | ICD-10-CM | POA: Diagnosis present

## 2021-02-14 DIAGNOSIS — Z20822 Contact with and (suspected) exposure to covid-19: Secondary | ICD-10-CM | POA: Diagnosis present

## 2021-02-14 DIAGNOSIS — R42 Dizziness and giddiness: Secondary | ICD-10-CM | POA: Diagnosis present

## 2021-02-14 DIAGNOSIS — M19042 Primary osteoarthritis, left hand: Secondary | ICD-10-CM | POA: Diagnosis present

## 2021-02-14 DIAGNOSIS — Z801 Family history of malignant neoplasm of trachea, bronchus and lung: Secondary | ICD-10-CM | POA: Diagnosis not present

## 2021-02-14 DIAGNOSIS — R531 Weakness: Secondary | ICD-10-CM

## 2021-02-14 DIAGNOSIS — Z8249 Family history of ischemic heart disease and other diseases of the circulatory system: Secondary | ICD-10-CM | POA: Diagnosis not present

## 2021-02-14 DIAGNOSIS — E860 Dehydration: Secondary | ICD-10-CM | POA: Diagnosis present

## 2021-02-14 DIAGNOSIS — I5032 Chronic diastolic (congestive) heart failure: Secondary | ICD-10-CM | POA: Diagnosis present

## 2021-02-14 DIAGNOSIS — T500X5A Adverse effect of mineralocorticoids and their antagonists, initial encounter: Secondary | ICD-10-CM | POA: Diagnosis present

## 2021-02-14 DIAGNOSIS — I70202 Unspecified atherosclerosis of native arteries of extremities, left leg: Secondary | ICD-10-CM | POA: Diagnosis present

## 2021-02-14 DIAGNOSIS — Z794 Long term (current) use of insulin: Secondary | ICD-10-CM | POA: Diagnosis not present

## 2021-02-14 DIAGNOSIS — N1330 Unspecified hydronephrosis: Secondary | ICD-10-CM | POA: Diagnosis not present

## 2021-02-14 DIAGNOSIS — G8929 Other chronic pain: Secondary | ICD-10-CM | POA: Diagnosis present

## 2021-02-14 DIAGNOSIS — I739 Peripheral vascular disease, unspecified: Secondary | ICD-10-CM

## 2021-02-14 DIAGNOSIS — R778 Other specified abnormalities of plasma proteins: Secondary | ICD-10-CM | POA: Diagnosis present

## 2021-02-14 DIAGNOSIS — M19041 Primary osteoarthritis, right hand: Secondary | ICD-10-CM | POA: Diagnosis present

## 2021-02-14 DIAGNOSIS — E1159 Type 2 diabetes mellitus with other circulatory complications: Secondary | ICD-10-CM | POA: Diagnosis not present

## 2021-02-14 LAB — CBC
HCT: 32.9 % — ABNORMAL LOW (ref 36.0–46.0)
Hemoglobin: 10.3 g/dL — ABNORMAL LOW (ref 12.0–15.0)
MCH: 29.5 pg (ref 26.0–34.0)
MCHC: 31.3 g/dL (ref 30.0–36.0)
MCV: 94.3 fL (ref 80.0–100.0)
Platelets: 189 10*3/uL (ref 150–400)
RBC: 3.49 MIL/uL — ABNORMAL LOW (ref 3.87–5.11)
RDW: 14 % (ref 11.5–15.5)
WBC: 5.8 10*3/uL (ref 4.0–10.5)
nRBC: 0 % (ref 0.0–0.2)

## 2021-02-14 LAB — BASIC METABOLIC PANEL
Anion gap: 6 (ref 5–15)
BUN: 54 mg/dL — ABNORMAL HIGH (ref 8–23)
CO2: 27 mmol/L (ref 22–32)
Calcium: 8.1 mg/dL — ABNORMAL LOW (ref 8.9–10.3)
Chloride: 108 mmol/L (ref 98–111)
Creatinine, Ser: 1.88 mg/dL — ABNORMAL HIGH (ref 0.44–1.00)
GFR, Estimated: 29 mL/min — ABNORMAL LOW (ref >60)
Glucose, Bld: 152 mg/dL — ABNORMAL HIGH (ref 70–99)
Potassium: 4.3 mmol/L (ref 3.5–5.1)
Sodium: 141 mmol/L (ref 135–145)

## 2021-02-14 LAB — HEMOGLOBIN A1C
Hgb A1c MFr Bld: 8 % — ABNORMAL HIGH (ref 4.8–5.6)
Mean Plasma Glucose: 182.9 mg/dL

## 2021-02-14 LAB — URINE CULTURE: Culture: <10000 — AB

## 2021-02-14 LAB — GLUCOSE, CAPILLARY
Glucose-Capillary: 104 mg/dL — ABNORMAL HIGH (ref 70–99)
Glucose-Capillary: 112 mg/dL — ABNORMAL HIGH (ref 70–99)
Glucose-Capillary: 166 mg/dL — ABNORMAL HIGH (ref 70–99)
Glucose-Capillary: 91 mg/dL (ref 70–99)
Glucose-Capillary: 97 mg/dL (ref 70–99)

## 2021-02-14 MED ORDER — HYDRALAZINE HCL 50 MG PO TABS: 50.0000 mg | ORAL_TABLET | Freq: Three times a day (TID) | ORAL | Status: DC

## 2021-02-14 MED ORDER — EZETIMIBE 10 MG PO TABS
10.0000 mg | ORAL_TABLET | Freq: Every day | ORAL | Status: DC
  Administered 2021-02-14 – 2021-02-20 (×7): 10 mg via ORAL
  Filled 2021-02-14 (×7): qty 1

## 2021-02-14 MED ORDER — GABAPENTIN 300 MG PO CAPS: 300.0000 mg | ORAL_CAPSULE | Freq: Three times a day (TID) | ORAL | Status: DC

## 2021-02-14 MED ORDER — SODIUM CHLORIDE 0.9 % IV SOLN: INTRAVENOUS | Status: DC

## 2021-02-14 MED ORDER — AMLODIPINE BESYLATE 10 MG PO TABS
10.0000 mg | ORAL_TABLET | Freq: Every day | ORAL | Status: DC
  Filled 2021-02-14: qty 1

## 2021-02-14 MED ORDER — PREGABALIN 75 MG PO CAPS
75.0000 mg | ORAL_CAPSULE | Freq: Two times a day (BID) | ORAL | Status: DC
  Administered 2021-02-14 – 2021-02-20 (×13): 75 mg via ORAL
  Filled 2021-02-14 (×13): qty 1

## 2021-02-14 MED ORDER — OSELTAMIVIR PHOSPHATE 75 MG PO CAPS
75.0000 mg | ORAL_CAPSULE | Freq: Once | ORAL | Status: AC
  Administered 2021-02-14: 75 mg via ORAL
  Filled 2021-02-14: qty 1

## 2021-02-14 MED ORDER — OXYCODONE-ACETAMINOPHEN 7.5-325 MG PO TABS
1.0000 | ORAL_TABLET | Freq: Four times a day (QID) | ORAL | Status: DC | PRN
  Administered 2021-02-14 – 2021-02-15 (×7): 1 via ORAL
  Filled 2021-02-14 (×7): qty 1

## 2021-02-14 MED ORDER — OSELTAMIVIR PHOSPHATE 75 MG PO CAPS: 75.0000 mg | ORAL_CAPSULE | Freq: Two times a day (BID) | ORAL | Status: DC

## 2021-02-14 MED ORDER — CARVEDILOL 25 MG PO TABS
25.0000 mg | ORAL_TABLET | Freq: Two times a day (BID) | ORAL | Status: DC
  Administered 2021-02-14 – 2021-02-20 (×13): 25 mg via ORAL
  Filled 2021-02-14 (×13): qty 1

## 2021-02-14 MED ORDER — OXYCODONE HCL 5 MG PO TABS
5.0000 mg | ORAL_TABLET | Freq: Once | ORAL | Status: AC
  Administered 2021-02-14: 5 mg via ORAL
  Filled 2021-02-14: qty 1

## 2021-02-14 MED ORDER — HYDROCORTISONE 0.5 % EX CREA
TOPICAL_CREAM | Freq: Two times a day (BID) | CUTANEOUS | Status: DC
  Administered 2021-02-14 – 2021-02-19 (×2): 1 via TOPICAL
  Filled 2021-02-14: qty 28.35

## 2021-02-14 MED ORDER — ATORVASTATIN CALCIUM 40 MG PO TABS
40.0000 mg | ORAL_TABLET | Freq: Every day | ORAL | Status: DC
  Administered 2021-02-14 – 2021-02-20 (×7): 40 mg via ORAL
  Filled 2021-02-14 (×8): qty 1

## 2021-02-14 MED ORDER — OSELTAMIVIR PHOSPHATE 30 MG PO CAPS
30.0000 mg | ORAL_CAPSULE | Freq: Two times a day (BID) | ORAL | Status: AC
  Administered 2021-02-14 – 2021-02-18 (×9): 30 mg via ORAL
  Filled 2021-02-14 (×9): qty 1

## 2021-02-14 NOTE — Plan of Care (Signed)
Pt alert and oriented x 4. Reports increased weakness over a couple days. Pt has multiple open areas to RLL and rash with weeping to L stump. Per pt home health suspected shingles. Foam pad in place over area. Wound consult placed to evaluate. Pt received 1 dose of percocet and was effective at decreasing pain. Pt reported that plan was to go to SNF/Rehab due to son-inlaw unable to care for her in her current increased weakness. Transition of care order placed.  Problem: Education: Goal: Knowledge of General Education information will improve Description: Including pain rating scale, medication(s)/side effects and non-pharmacologic comfort measures Outcome: Progressing   Problem: Health Behavior/Discharge Planning: Goal: Ability to manage health-related needs will improve Outcome: Progressing   Problem: Clinical Measurements: Goal: Ability to maintain clinical measurements within normal limits will improve Outcome: Progressing Goal: Will remain free from infection Outcome: Progressing Goal: Diagnostic test results will improve Outcome: Progressing Goal: Respiratory complications will improve Outcome: Progressing Goal: Cardiovascular complication will be avoided Outcome: Progressing   Problem: Activity: Goal: Risk for activity intolerance will decrease Outcome: Progressing   Problem: Nutrition: Goal: Adequate nutrition will be maintained Outcome: Progressing   Problem: Elimination: Goal: Will not experience complications related to bowel motility Outcome: Progressing Goal: Will not experience complications related to urinary retention Outcome: Progressing   Problem: Pain Managment: Goal: General experience of comfort will improve Outcome: Progressing   Problem: Safety: Goal: Ability to remain free from injury will improve Outcome: Progressing   Problem: Skin Integrity: Goal: Risk for impaired skin integrity will decrease Outcome: Progressing

## 2021-02-14 NOTE — TOC Initial Note (Signed)
Transition of Care [facility]) - Initial/Assessment Note    Patient Details  Name: Shirley Chen MRN: [id number] Date of Birth: 1955/11/09  [facility] CM/SW Contact:    Lennart Pall, LCSW Phone Number: 02/14/2021, 2:43 PM  Clinical Narrative:                 Met with pt today to introduce self/ TOC role with dc planning.  Pt confirms that she lives with her son-in-law (pt's daughter died ~ one year ago) and had only been home from local SNF for ~ one week when she developed current medical issues/ AKI.  Pt had been in a SNF [facility]) since May 2022 except for brief hospital readmit and return to facility.  Pt reports that she is no longer able to manage at the home and states that son-in-law will not allow her return.  She is requesting assistance with LTC placement as she has used all of her insurance covered SNF days.  She does have Medicaid.  Explained that will need to make a wide area search for LTC bed.  Pt is NOT vaccinated for COVID and does not want to receive vaccine.  Will begin LTC bed search.  Expected Discharge Plan: Long Term Nursing Home Barriers to Discharge: SNF Pending bed offer   Patient Goals and CMS Choice        Expected Discharge Plan and Services Expected Discharge Plan: Long Term Nursing Home In-house Referral: Clinical Social Work     Living arrangements for the past 2 months: Single Family Home                 DME Arranged: N/A DME Agency: NA                  Prior Living Arrangements/Services Living arrangements for the past 2 months: Single Family Home Lives with:: Relatives Patient language and need for interpreter reviewed:: No Do you feel safe going back to the place where you live?: Yes      Need for Family Participation in Patient Care: Yes (Comment) Care giver support system in place?: No (comment) Current home services: Home PT, Home OT, Home RN (was open to [facility]) Criminal Activity/Legal  Involvement Pertinent to Current Situation/Hospitalization: No - Comment as needed  Activities of Daily Living Home Assistive Devices/Equipment: Eyeglasses, Environmental consultant (specify type), Cane (specify quad or straight) ADL Screening (condition at time of admission) Patient's cognitive ability adequate to safely complete daily activities?: Yes Is the patient deaf or have difficulty hearing?: No Does the patient have difficulty seeing, even when wearing glasses/contacts?: No Does the patient have difficulty concentrating, remembering, or making decisions?: No Patient able to express need for assistance with ADLs?: No Does the patient have difficulty dressing or bathing?: Yes Independently performs ADLs?: No Communication: Independent Dressing (OT): Needs assistance Is this a change from baseline?: Change from baseline, expected to last >3 days Grooming: Needs assistance Is this a change from baseline?: Change from baseline, expected to last >3 days Feeding: Independent Bathing: Needs assistance Is this a change from baseline?: Change from baseline, expected to last >3 days Toileting: Needs assistance Is this a change from baseline?: Change from baseline, expected to last <3 days In/Out Bed: Needs assistance Is this a change from baseline?: Change from baseline, expected to last >3 days Walks in Home: Needs assistance Is this a change from baseline?: Change from baseline, expected to last >3 days Does the patient have difficulty walking or climbing  stairs?: Yes Weakness of Legs: Left Weakness of Arms/Hands: Both  Permission Sought/Granted Permission sought to share information with : Facility Sport and exercise psychologist                Emotional Assessment Appearance:: Appears stated age Attitude/Demeanor/Rapport: Lethargic Affect (typically observed): Appropriate, Sad Orientation: : Oriented to Self, Oriented to Place, Oriented to  Time, Oriented to Situation Alcohol / Substance Use: Not  Applicable Psych Involvement: No (comment)  Admission diagnosis:  Weakness [R53.1] AKI (acute kidney injury) ([place]) [N17.9] Acute kidney injury ([place]) [N17.9] Patient Active Problem List   Diagnosis Date Noted   Influenza A 02/14/2021   AKI (acute kidney injury) ([place]) 02/14/2021   Acute kidney injury ([place]) 02/13/2021   Weakness 02/13/2021   Bacteremia due to Gram-negative bacteria 09/28/2020   COVID-19 virus infection 09/26/2020   Leucocytosis 09/25/2020   Severe sepsis ([place]) 09/25/2020   Necrotizing fasciitis ([place])    Acute respiratory failure with hypoxia ([place]) 08/02/2020   Acute renal failure superimposed on stage 3b chronic kidney disease ([place]) 08/02/2020   Status post amputation of great toe, right ([place]) 03/11/2019   Hyperlipidemia associated with type 2 diabetes mellitus ([place]) 06/16/2017   Mononeuropathy due to underlying disease 02/12/2017   Diabetic foot infection ([place])    Type 2 diabetes mellitus ([place]) 11/27/2014   PAD (peripheral artery disease) ([place]) 11/26/2014   Hypertension associated with diabetes ([place]) 11/26/2014   Diabetes mellitus with peripheral vascular disease ([place]) 11/26/2014   Atherosclerosis of native arteries of the extremities with ulceration ([place]) 10/27/2014   Tobacco abuse 10/20/2014   PCP:  Marrian Salvage, FNP Pharmacy:   [facility], [place] - [address] [place] [place] [address] Phone: [phone] Fax: [phone]     Social Determinants of Health (SDOH) Interventions    Readmission Risk Interventions No flowsheet data found.

## 2021-02-14 NOTE — Evaluation (Signed)
Physical Therapy Evaluation Patient Details Name: Shirley Chen MRN: [id number] DOB: [birth year] Today's Date: 02/14/2021  History of Present Illness  65 y.o. female with admitted with weakness.  PMH: COVID-19, hypertension, diabetes, peripheral vascular disease, tobacco abuse, recent necrotizing fasciitis with left below-knee amputation in May 2022, history of femoral-tibial bypass surgery in the past who was discharged to a skilled facility where pt reports she was for 5 months. Pt returned home for 1 week, was unable to get herself off the toilet and waiting on toilet for 10 hours for son to return from work.  Clinical Impression  Pt admitted with above diagnosis. +2 min to mod assist to transfer from bed to recliner with Stedy lift equipment. Activity tolerance limited by R buttock pain. ST-SNF recommended.  Pt currently with functional limitations due to the deficits listed below (see PT Problem List). Pt will benefit from skilled PT to increase their independence and safety with mobility to allow discharge to the venue listed below.          Recommendations for follow up therapy are one component of a multi-disciplinary discharge planning process, led by the attending physician.  Recommendations may be updated based on patient status, additional functional criteria and insurance authorization.  Follow Up Recommendations Skilled nursing-short term rehab (<3 hours/day)    Assistance Recommended at Discharge Intermittent Supervision/Assistance  Functional Status Assessment Patient has had a recent decline in their functional status and demonstrates the ability to make significant improvements in function in a reasonable and predictable amount of time.  Equipment Recommendations  None recommended by PT    Recommendations for Other Services       Precautions / Restrictions Precautions Precautions: Fall Precaution Comments: fell at home on day of DC from SNF (about 2 weeks ago); L  BKA Restrictions Weight Bearing Restrictions: No      Mobility  Bed Mobility Overal bed mobility: Needs Assistance Bed Mobility: Supine to Sit     Supine to sit: HOB elevated;Min assist     General bed mobility comments: min A to pivot hips to EOB with pad    Transfers Overall transfer level: Needs assistance   Transfers: Sit to/from Stand;Bed to chair/wheelchair/BSC Sit to Stand: Min assist;+2 physical assistance;+2 safety/equipment           General transfer comment: MIn A of 2 to stand pulling up on Stedy from bed; mod A of 2 to come to full stand from seated position on Stedy; R buttock pain limiting activity tolerance Transfer via Lift Equipment: Stedy  Ambulation/Gait               General Gait Details: unable, prosthesis is at home  Stairs            Wheelchair Mobility    Modified Rankin (Stroke Patients Only)       Balance Overall balance assessment: History of Falls;Needs assistance (Fell in kitchen the day from home.) Sitting-balance support: Feet supported;Single extremity supported Sitting balance-Leahy Scale: Fair Sitting balance - Comments: pt leaning in multi directions 2* R buttock/sciatic pain     Standing balance-Leahy Scale: Poor                               Pertinent Vitals/Pain Pain Assessment: 0-10 Pain Score: 7  Pain Location: RT buttocks and leg. Pt reports "sciatica pain".  Pt repeating "Oh God" during all mobility. Pain Descriptors / Indicators: Lambert Mody;Shooting Pain Intervention(s): Limited activity within  patient's tolerance;Monitored during session;Patient requesting pain meds-RN notified;Premedicated before session    Home Living Family/patient expects to be discharged to:: Private residence Living Arrangements: Other (Comment) (son in law, granddaughter who is 43) Available Help at Discharge: Available PRN/intermittently (Son works alternate shifts. When SIL works nights, granddaughter  stays with a  friend and pt is alone.) Type of Home: House Home Access: Stairs to enter Entrance Stairs-Rails: Right;Left (wide apart) Entrance Stairs-Number of Steps: 3   Home Layout: One level Home Equipment: Cane - quad;Tub bench;Grab bars - tub/shower;Wheelchair - Forensic psychologist (2 wheels);Rollator (4 wheels)      Prior Function Prior Level of Function : Needs assist       Physical Assist : Mobility (physical)     Mobility Comments: Pt reports she was able to walk short distances with RW and prosthesis. ADLs Comments: Son in law provides transportation, but no other BADLs. SIL performs laundry as it is 2 steps down. Pt was preparing her own food in the mircowave.     Hand Dominance   Dominant Hand: Right    Extremity/Trunk Assessment   Upper Extremity Assessment Upper Extremity Assessment: Defer to OT evaluation    Lower Extremity Assessment Lower Extremity Assessment: Generalized weakness    Cervical / Trunk Assessment Cervical / Trunk Assessment: Kyphotic  Communication   Communication: No difficulties  Cognition Arousal/Alertness: Awake/alert Behavior During Therapy: Anxious;WFL for tasks assessed/performed Overall Cognitive Status: Within Functional Limits for tasks assessed                                          General Comments      Exercises     Assessment/Plan    PT Assessment Patient needs continued PT services  PT Problem List Decreased strength;Decreased activity tolerance;Decreased balance;Pain;Decreased mobility       PT Treatment Interventions Therapeutic activities;Therapeutic exercise;Gait training;Functional mobility training;Patient/family education;Balance training    PT Goals (Current goals can be found in the Care Plan section)  Acute Rehab PT Goals Patient Stated Goal: to get stronger, reduce pain PT Goal Formulation: With patient Time For Goal Achievement: 02/28/21 Potential to Achieve Goals: Good    Frequency  Min 2X/week   Barriers to discharge        Co-evaluation PT/OT/SLP Co-Evaluation/Treatment: Yes Reason for Co-Treatment: For patient/therapist safety;To address functional/ADL transfers PT goals addressed during session: Mobility/safety with mobility;Balance;Proper use of DME         AM-PAC PT "6 Clicks" Mobility  Outcome Measure Help needed turning from your back to your side while in a flat bed without using bedrails?: A Little Help needed moving from lying on your back to sitting on the side of a flat bed without using bedrails?: A Little Help needed moving to and from a bed to a chair (including a wheelchair)?: Total Help needed standing up from a chair using your arms (e.g., wheelchair or bedside chair)?: A Lot Help needed to walk in hospital room?: Total Help needed climbing 3-5 steps with a railing? : Total 6 Click Score: 11    End of Session Equipment Utilized During Treatment: Gait belt Activity Tolerance: Patient limited by pain Patient left: in chair;with call bell/phone within reach;with chair alarm set Nurse Communication: Mobility status;Need for lift equipment;Patient requests pain meds PT Visit Diagnosis: Unsteadiness on feet (R26.81);Other abnormalities of gait and mobility (R26.89);History of falling (Z91.81);Pain;Difficulty in walking, not elsewhere classified (R26.2)  Pain - Right/Left: Right Pain - part of body: Hip    Time: 9476-5465 PT Time Calculation (min) (ACUTE ONLY): 25 min   Charges:   PT Evaluation $PT Eval Moderate Complexity: 1 Mod          Tamala Ser PT 02/14/2021  Acute Rehabilitation Services Pager [phone] Office [phone]

## 2021-02-14 NOTE — Progress Notes (Signed)
PROGRESS NOTE    Shirley Chen  KGM:[id number] DOB: 1955/12/04 DOA: 02/13/2021 PCP: Olive Bass, FNP    Chief Complaint  Patient presents with   Weakness    Brief Narrative:  Patient is a pleasant 65 year old female history of HFpEF EF 60 to 65%, G2 DD by TTE 08/04/2020, insulin-dependent type 2 diabetes, hypertension, hyperlipidemia, peripheral vascular disease with history of necrotizing abscess status post left BKA, chronic back pain admitted with generalized weakness and acute kidney injury.  Noted to be positive for influenza A.   Assessment & Plan:   Principal Problem:   Acute kidney injury (HCC) Active Problems:   Influenza A   PAD (peripheral artery disease) (HCC)   Hypertension associated with diabetes (HCC)   Diabetes mellitus with peripheral vascular disease (HCC)   Hyperlipidemia associated with type 2 diabetes mellitus (HCC)   Generalized weakness   #1 acute kidney injury -Likely secondary to prerenal azotemia in the setting of diuretics of spironolactone and Lasix. -Patient clinically dry on examination. -Urinalysis with 30 of protein, nitrite negative leukocytes negative.  Urine sodium of 39, urine creatinine of 114.35. -Creatinine is slowly trending down currently at 1.88 from 1.98 on admission. -Last creatinine noted at 0.9 on 10/01/2020 -Check a renal ultrasound. -Increase IV fluids to normal saline at 125 cc an hour for the next 1 to 2 days and monitor renal function. -Continue to hold Lasix and spironolactone. -Toxins.  2.  Influenza A -Likely etiology of patient's generalized weakness. -COVID-19 PCR negative. -Tamiflu x5 days.  3.  Dehydration -IV fluids.  4.  Elevated troponin -Troponin minimally elevated and flattened likely in the setting of AKI. -Patient with no chest pain, EKG with no ischemic changes. -No further cardiac work-up needed at this time.  5.  Peripheral vascular disease status post left BKA -Continue statin,  Zetia.  6.  Insulin-dependent type 2 diabetes mellitus -Hemoglobin A1c 8.0 (11/102022). -CBG 166 this morning. -Continue Semglee, SSI.  7.  Hypertension -Blood pressure borderline. -Continue Coreg. -Discontinue Norvasc, hydralazine.  8.  Hyperlipidemia -Continue statin, Zetia.  9.  Chronic back pain -Continue home regimen Percocet as needed. -It is noted that patient stated she was on Lyrica instead of gabapentin and as such we will place on Lyrica and discontinue gabapentin.  10.  Generalized weakness Likely secondary to problem #2. -PT/OT.  11.  HFpEF -2D echo from 08/04/2020 with EF of 60 to 65%, G2 DD.  Patient more on the dry side today. -Monitor volume status with fluid resuscitation. -Continue to hold Lasix and spironolactone.    DVT prophylaxis: Heparin Code Status: DNR Family Communication: Updated patient.  No family at bedside. Disposition:   Status is: Observation  The patient remains OBS appropriate and will d/c before 2 midnights.      Consultants:  None  Procedures:  Renal ultrasound pending Chest x-ray 02/13/2021   Antimicrobials:  Tamiflu 02/14/2021>>>>>>   Subjective: Patient laying in bed complaining of significant sciatica pain in her right lower extremity that she states is ongoing and uses Percocet at home for this.  No chest pain.  No shortness of breath.  Still with complaints of significant weakness.  Objective: Vitals:   02/14/21 0018 02/14/21 0212 02/14/21 0530 02/14/21 0900  BP: (!) 115/38 (!) 108/53 121/89 (!) 116/53  Pulse: 68 63 71 63  Resp: 20 16 16 18   Temp: 98.8 F (37.1 C) 98.3 F (36.8 C) 98.5 F (36.9 C) 98.1 F (36.7 C)  TempSrc: Oral Oral Oral Oral  SpO2:  92% 95% 91% 92%  Weight:      Height:        Intake/Output Summary (Last 24 hours) at 02/14/2021 1109 Last data filed at 02/14/2021 0300 Gross per 24 hour  Intake 1262.48 ml  Output 500 ml  Net 762.48 ml   Filed Weights   02/13/21 2359  Weight:  85.7 kg    Examination:  General exam: Appears calm and comfortable.  Dry mucous membranes. Respiratory system: Clear to auscultation. Respiratory effort normal. Cardiovascular system: S1 & S2 heard, RRR. No JVD, murmurs, rubs, gallops or clicks. No pedal edema. Gastrointestinal system: Abdomen is nondistended, soft and nontender. No organomegaly or masses felt. Normal bowel sounds heard. Central nervous system: Alert and oriented. No focal neurological deficits. Extremities: Status post left BKA. Skin: No rashes, lesions or ulcers Psychiatry: Judgement and insight appear normal. Mood & affect appropriate.     Data Reviewed: I have personally reviewed following labs and imaging studies  CBC: Recent Labs  Lab 02/13/21 2003 02/14/21 0311  WBC 6.9 5.8  NEUTROABS 5.2  --   HGB 12.0 10.3*  HCT 37.3 32.9*  MCV 93.0 94.3  PLT 235 189    Basic Metabolic Panel: Recent Labs  Lab 02/13/21 2003 02/14/21 0311  NA 140 141  K 4.8 4.3  CL 105 108  CO2 25 27  GLUCOSE 181* 152*  BUN 53* 54*  CREATININE 1.98* 1.88*  CALCIUM 8.7* 8.1*  MG 2.3  --     GFR: Estimated Creatinine Clearance: 32 mL/min (A) (by C-G formula based on SCr of 1.88 mg/dL (H)).  Liver Function Tests: No results for input(s): AST, ALT, ALKPHOS, BILITOT, PROT, ALBUMIN in the last 168 hours.  CBG: Recent Labs  Lab 02/14/21 0035 02/14/21 0805  GLUCAP 166* 91     Recent Results (from the past 240 hour(s))  Culture, blood (routine x 2)     Status: None (Preliminary result)   Collection Time: 02/13/21  8:03 PM   Specimen: BLOOD  Result Value Ref Range Status   Specimen Description   Final    BLOOD BLOOD LEFT HAND Performed at [facility], [address], [place], [place] [address]    Special Requests   Final    BOTTLES DRAWN AEROBIC AND ANAEROBIC Blood Culture adequate volume Performed at [facility], [address], [place], [place] [address]    Culture    Final    NO GROWTH < 12 HOURS Performed at [facility], [address], [place], [place] [address]    Report Status PENDING  Incomplete  Culture, blood (routine x 2)     Status: None (Preliminary result)   Collection Time: 02/13/21  8:03 PM   Specimen: BLOOD  Result Value Ref Range Status   Specimen Description   Final    BLOOD RIGHT ANTECUBITAL Performed at [facility], [address] [place], [place] [address]    Special Requests   Final    BOTTLES DRAWN AEROBIC AND ANAEROBIC Blood Culture adequate volume Performed at [facility], [address] [place], [place] [address]    Culture   Final    NO GROWTH < 12 HOURS Performed at [facility], [address], [place], [place] [address]    Report Status PENDING  Incomplete  Resp Panel by RT-PCR (Flu A&B, Covid) Nasopharyngeal Swab     Status: Abnormal   Collection Time: 02/13/21  9:49 PM   Specimen:  Nasopharyngeal Swab; Nasopharyngeal(NP) swabs in vial transport medium  Result Value Ref Range Status   SARS Coronavirus 2 by RT PCR NEGATIVE NEGATIVE Final    Comment: (NOTE) SARS-CoV-2 target nucleic acids are NOT DETECTED.  The SARS-CoV-2 RNA is generally detectable in upper respiratory specimens during the acute phase of infection. The lowest concentration of SARS-CoV-2 viral copies this assay can detect is 138 copies/mL. A negative result does not preclude SARS-Cov-2 infection and should not be used as the sole basis for treatment or other patient management decisions. A negative result may occur with  improper specimen collection/handling, submission of specimen other than nasopharyngeal swab, presence of viral mutation(s) within the areas targeted by this assay, and inadequate number of viral copies(<138 copies/mL). A negative result must be combined with clinical observations, patient history, and epidemiological information. The expected result is Negative.  Fact  Sheet for Patients:  BloggerCourse.com  Fact Sheet for Healthcare Providers:  SeriousBroker.it  This test is no t yet approved or cleared by the [place] FDA and  has been authorized for detection and/or diagnosis of SARS-CoV-2 by FDA under an Emergency Use Authorization (EUA). This EUA will remain  in effect (meaning this test can be used) for the duration of the COVID-19 declaration under Section 564(b)(1) of the Act, 21 U.S.C.section 360bbb-3(b)(1), unless the authorization is terminated  or revoked sooner.       Influenza A by PCR POSITIVE (A) NEGATIVE Final   Influenza B by PCR NEGATIVE NEGATIVE Final    Comment: (NOTE) The Xpert Xpress SARS-CoV-2/FLU/RSV plus assay is intended as an aid in the diagnosis of influenza from Nasopharyngeal swab specimens and should not be used as a sole basis for treatment. Nasal washings and aspirates are unacceptable for Xpert Xpress SARS-CoV-2/FLU/RSV testing.  Fact Sheet for Patients: BloggerCourse.com  Fact Sheet for Healthcare Providers: SeriousBroker.it  This test is not yet approved or cleared by the [place] FDA and has been authorized for detection and/or diagnosis of SARS-CoV-2 by FDA under an Emergency Use Authorization (EUA). This EUA will remain in effect (meaning this test can be used) for the duration of the COVID-19 declaration under Section 564(b)(1) of the Act, 21 U.S.C. section 360bbb-3(b)(1), unless the authorization is terminated or revoked.  Performed at [facility], [address] [place], [place] [address]          Radiology Studies: DG Chest Port 1 View  Result Date: 02/13/2021 CLINICAL DATA:  Increased weakness EXAM: PORTABLE CHEST 1 VIEW COMPARISON:  Chest x-ray 08/05/2020 FINDINGS: The heart and mediastinal contours are within normal limits. Aortic calcification.  Similar-appearing hilar calcification likely sequelae of prior granulomatous disease. No focal consolidation. No pulmonary edema. No pleural effusion. No pneumothorax. No acute osseous abnormality. IMPRESSION: No active disease. Electronically Signed   By: Tish Frederickson M.D.   On: 02/13/2021 20:20        Scheduled Meds:  atorvastatin  40 mg Oral Daily   carvedilol  25 mg Oral BID WC   ezetimibe  10 mg Oral Daily   heparin  5,000 Units Subcutaneous Q8H   insulin aspart  0-9 Units Subcutaneous TID WC   insulin glargine-yfgn  15 Units Subcutaneous QHS   oseltamivir  30 mg Oral BID   oxyCODONE  5 mg Oral Once   pregabalin  75 mg Oral BID   Continuous Infusions:  sodium chloride 125 mL/hr at 02/14/21 0942     LOS: 0 days    Time spent: 40 minutes  Ramiro Harvest, MD [facility]   To contact the attending provider between 7A-7P or the covering provider during after hours 7P-7A, please log into the web site [email or web address] and access using universal [place] password for that web site. If you do not have the password, please call the hospital operator.  02/14/2021, 11:09 AM

## 2021-02-14 NOTE — Evaluation (Signed)
Occupational Therapy Evaluation Patient Details Name: Shirley Chen MRN: [id number] DOB: 1955/11/04 Today's Date: 02/14/2021   History of Present Illness 65 y.o. female with admitted with weakness.  PMH: COVID-19, hypertension, diabetes, peripheral vascular disease, tobacco abuse, recent necrotizing fasciitis with left below-knee amputation in May 2022, history of femoral-tibial bypass surgery in the past. Pt was discharged to a skilled facility where pt reports she was for 5 months. Pt returned home for 1 week, was unable to get herself off the toilet and waiting on toilet for 10 hours for son to return from work.   Clinical Impression   Patient is currently requiring assistance with ADLs including Total assist with toileting, and with LE dressing, maximum assist with bathing, and minimal assist with UE dressing as well as Min-Moderate assist of 2 people with all mobility and need of Steady for transfers as pt without her prosthesis for LLE.  Current level of function is below patient's typical baseline.  During this evaluation, patient was limited by generalized weakness, impaired activity tolerance, and especially RT LE pain which pt reports is from sciatica. Limitations have the potential to impact patient's safety and independence during functional mobility, as well as performance for ADLs.  Patient lives with her SIL, who is unable to provide 24/7 supervision and assistance due to working.  Patient demonstrates fair rehab potential, and should benefit from continued skilled occupational therapy services while in acute care to maximize safety, independence and quality of life at home.  Continued occupational therapy services in a SNF setting prior to return home is recommended.  ?       Recommendations for follow up therapy are one component of a multi-disciplinary discharge planning process, led by the attending physician.  Recommendations may be updated based on patient status, additional  functional criteria and insurance authorization.   Follow Up Recommendations  Skilled nursing-short term rehab (<3 hours/day)    Assistance Recommended at Discharge Frequent or constant Supervision/Assistance  Functional Status Assessment  Patient has had a recent decline in their functional status and demonstrates the ability to make significant improvements in function in a reasonable and predictable amount of time.  Equipment Recommendations   (Will defer to post-acute recommendations)    Recommendations for Other Services       Precautions / Restrictions Precautions Precautions: Fall Precaution Comments: fell at home on day of DC from SNF (about 2 weeks ago); L BKA Restrictions Weight Bearing Restrictions: No Other Position/Activity Restrictions: Pt does not have prosthesis in room.      Mobility Bed Mobility Overal bed mobility: Needs Assistance Bed Mobility: Supine to Sit     Supine to sit: HOB elevated;Min assist     General bed mobility comments: min A to pivot hips to EOB with pad    Transfers Overall transfer level: Needs assistance   Transfers: Sit to/from Stand;Bed to chair/wheelchair/BSC Sit to Stand: Min assist;+2 physical assistance;+2 safety/equipment;Mod assist           General transfer comment: MIn A of 2 to stand pulling up on Stedy from bed; mod A of 2 to come to full stand from seated position on Stedy; R buttock pain limiting activity tolerance Transfer via Lift Equipment: Stedy    Balance Overall balance assessment: History of Falls;Needs assistance (Recent fall in kitchen the day pt returned home from rehab,) Sitting-balance support: Feet supported;Single extremity supported Sitting balance-Leahy Scale: Fair Sitting balance - Comments: pt leaning in multi directions 2* R buttock/sciatic pain     Standing  balance-Leahy Scale: Poor                             ADL either performed or assessed with clinical judgement   ADL  Overall ADL's : Needs assistance/impaired Eating/Feeding: Set up;Bed level   Grooming: Set up;Bed level   Upper Body Bathing: Minimal assistance;Bed level   Lower Body Bathing: Maximal assistance;Bed level   Upper Body Dressing : Minimal assistance;Sitting   Lower Body Dressing: Total assistance;Bed level   Toilet Transfer: Moderate assistance;+2 for physical assistance;+2 for safety/equipment Toilet Transfer Details (indicate cue type and reason): To recliner with Moderate assist of 2 people and use of Steady. Toileting- Clothing Manipulation and Hygiene: Total assistance;Bed level Toileting - Clothing Manipulation Details (indicate cue type and reason): Wearing diaper brief.     Functional mobility during ADLs: Minimal assistance;Moderate assistance;+2 for physical assistance;+2 for safety/equipment Antony Salmon)       Vision   Vision Assessment?: No apparent visual deficits     Perception     Praxis      Pertinent Vitals/Pain Pain Assessment: 0-10 Pain Score: 7  Pain Location: RT buttocks and leg. Pt reports "sciatica pain".  Pt repeating "Oh God" during all mobility. Pain Descriptors / Indicators: Lambert Mody;Shooting Pain Intervention(s): Limited activity within patient's tolerance;Monitored during session;Patient requesting pain meds-RN notified;Premedicated before session;Heat applied     Hand Dominance Right   Extremity/Trunk Assessment Upper Extremity Assessment Upper Extremity Assessment: Generalized weakness   Lower Extremity Assessment Lower Extremity Assessment: Generalized weakness   Cervical / Trunk Assessment Cervical / Trunk Assessment: Kyphotic   Communication Communication Communication: No difficulties   Cognition Arousal/Alertness: Awake/alert Behavior During Therapy: Anxious;WFL for tasks assessed/performed;Impulsive (Pain contributes to impulsivity.) Overall Cognitive Status: Within Functional Limits for tasks assessed                                        General Comments       Exercises     Shoulder Instructions      Home Living Family/patient expects to be discharged to:: Private residence Living Arrangements: Other (Comment) (son in law, granddaughter who is 47) Available Help at Discharge: Available PRN/intermittently (Son works alternate shifts. When SIL works nights, granddaughter  stays with a friend and pt is alone.) Type of Home: House Home Access: Stairs to enter Secretary/administrator of Steps: 3 Entrance Stairs-Rails: Right;Left (wide apart) Home Layout: One level     Bathroom Shower/Tub: Chief Strategy Officer: Standard Bathroom Accessibility: Yes How Accessible: Accessible via walker (Sideways with walker) Home Equipment: Cane - quad;Tub bench;Grab bars - tub/shower;Wheelchair - Forensic psychologist (2 wheels);Rollator (4 wheels)          Prior Functioning/Environment Prior Level of Function : Needs assist       Physical Assist : Mobility (physical);ADLs (physical)   ADLs (physical): Toileting;IADLs Mobility Comments: Pt reports she was able to walk short distances with RW and prosthesis. ADLs Comments: Son in law provides transportation, but no other BADLs. SIL performs laundry as it is 2 steps down. Pt was preparing her own food in the mircowave.        OT Problem List: Pain;Decreased activity tolerance;Impaired balance (sitting and/or standing);Decreased knowledge of use of DME or AE;Decreased knowledge of precautions;Obesity;Decreased safety awareness;Decreased strength      OT Treatment/Interventions: Self-care/ADL training;Therapeutic activities;Therapeutic exercise;Energy conservation;Patient/family education;DME and/or AE  instruction;Balance training    OT Goals(Current goals can be found in the care plan section) Acute Rehab OT Goals Patient Stated Goal: Pt agreeable with return to SNF for rehab OT Goal Formulation: With patient Time For Goal Achievement:  02/28/21 Potential to Achieve Goals: Fair ADL Goals Pt Will Perform Grooming: sitting;with set-up (EOB) Pt Will Perform Lower Body Bathing: with adaptive equipment;sitting/lateral leans;with min assist Pt Will Perform Lower Body Dressing: with min guard assist;sitting/lateral leans;with adaptive equipment;bed level Pt Will Transfer to Toilet: bedside commode;squat pivot transfer;stand pivot transfer;with min guard assist Pt Will Perform Toileting - Clothing Manipulation and hygiene: with adaptive equipment;with min guard assist;sitting/lateral leans (Educate on AE options including bidet) Additional ADL Goal #1: Pt will improve sitting balance to good static and fair+ dynamic in order to increase safety and participation during seated ADLs.  OT Frequency: Min 2X/week   Barriers to D/C: Decreased caregiver support          Co-evaluation PT/OT/SLP Co-Evaluation/Treatment: Yes Reason for Co-Treatment: Complexity of the patient's impairments (multi-system involvement);For patient/therapist safety;To address functional/ADL transfers PT goals addressed during session: Mobility/safety with mobility;Balance;Proper use of DME OT goals addressed during session: Proper use of Adaptive equipment and DME;ADL's and self-care      AM-PAC OT "6 Clicks" Daily Activity     Outcome Measure Help from another person eating meals?: None Help from another person taking care of personal grooming?: A Little Help from another person toileting, which includes using toliet, bedpan, or urinal?: Total Help from another person bathing (including washing, rinsing, drying)?: A Lot Help from another person to put on and taking off regular upper body clothing?: A Little Help from another person to put on and taking off regular lower body clothing?: Total 6 Click Score: 14   End of Session Equipment Utilized During Treatment: Gait belt Antony Salmon) Nurse Communication: Mobility status;Patient requests pain meds;Need for  lift equipment  Activity Tolerance: Patient limited by pain Patient left: in chair;with call bell/phone within reach;with nursing/sitter in room;with chair alarm set (hot pack applied to area of pain by PT)  OT Visit Diagnosis: Unsteadiness on feet (R26.81);Pain;History of falling (Z91.81);Muscle weakness (generalized) (M62.81) Pain - Right/Left: Right Pain - part of body: Hip;Knee;Leg                Time: 1478-2956 OT Time Calculation (min): 24 min Charges:  OT General Charges $OT Visit: 1 Visit OT Evaluation $OT Eval Low Complexity: 1 Low  Ranata Laughery, OT Acute Rehab Services Office: [phone] 02/14/2021 Theodoro Clock 02/14/2021, 12:19 PM

## 2021-02-14 NOTE — Consult Note (Signed)
WOC Nurse Consult Note: Reason for Consult: dry lesions on the left lateral stump, recently healed partial thickness skin loss at the ankle, near the elastic of her non-skid sock. Wound type: partial thickness Pressure Injury POA: N/A Measurement:N/A Wound bed:N/A Drainage (amount, consistency, odor) None Periwound:intact, dry Dressing procedure/placement/frequency: I have provided Nursing with guidance for twice daily application of 0.5% hydrocortisone cream after cleansing. No dressing, leave open to air.  WOC nursing team will not follow, but will remain available to this patient, the nursing and medical teams.  Please re-consult if needed. Thanks, Ladona Mow, MSN, RN, GNP, [place]  Pager# [phone]

## 2021-02-14 NOTE — NC FL2 (Signed)
[place] LEVEL OF CARE SCREENING TOOL     IDENTIFICATION  Patient Name: Shirley Chen Birthdate: 09/17/1955 Sex: female Admission Date (Current Location): 02/13/2021  [facility] and [place] Number:  Herbalist and Address:  [facility],  [address] [place], [place]      Provider Number: [id number]  Attending Physician Name and Address:  Eugenie Filler, MD  Relative Name and Phone Number:       Current Level of Care: Hospital Recommended Level of Care: [place] Prior Approval Number:    Date Approved/Denied:   PASRR Number: [id number]  Discharge Plan: SNF    Current Diagnoses: Patient Active Problem List   Diagnosis Date Noted   Influenza A 02/14/2021   AKI (acute kidney injury) ([place]) 02/14/2021   Acute kidney injury ([place]) 02/13/2021   Weakness 02/13/2021   Bacteremia due to Gram-negative bacteria 09/28/2020   COVID-19 virus infection 09/26/2020   Leucocytosis 09/25/2020   Severe sepsis ([place]) 09/25/2020   Necrotizing fasciitis ([place])    Acute respiratory failure with hypoxia ([place]) 08/02/2020   Acute renal failure superimposed on stage 3b chronic kidney disease ([place]) 08/02/2020   Status post amputation of great toe, right ([place]) 03/11/2019   Hyperlipidemia associated with type 2 diabetes mellitus ([place]) 06/16/2017   Mononeuropathy due to underlying disease 02/12/2017   Diabetic foot infection ([place])    Type 2 diabetes mellitus ([place]) 11/27/2014   PAD (peripheral artery disease) ([place]) 11/26/2014   Hypertension associated with diabetes ([place]) 11/26/2014   Diabetes mellitus with peripheral vascular disease ([place]) 11/26/2014   Atherosclerosis of native arteries of the extremities with ulceration ([place]) 10/27/2014   Tobacco abuse 10/20/2014    Orientation RESPIRATION BLADDER Height & Weight     Self, Time, Situation, Place  Normal Continent, External catheter (currently with purewick) Weight: 189 lb (85.7  kg) Height:  5\' 4"  (162.6 cm)  BEHAVIORAL SYMPTOMS/MOOD NEUROLOGICAL BOWEL NUTRITION STATUS      Continent    AMBULATORY STATUS COMMUNICATION OF NEEDS Skin   Extensive Assist Verbally Normal                       Personal Care Assistance Level of Assistance  Bathing, Dressing Bathing Assistance: Maximum assistance   Dressing Assistance: Limited assistance     Functional Limitations Info             SPECIAL CARE FACTORS FREQUENCY  PT (By licensed PT), OT (By licensed OT)     PT Frequency: 3-5x/ wk OT Frequency: 3-5x/wk            Contractures Contractures Info: Not present    Additional Factors Info  Code Status, Allergies, Insulin Sliding Scale Code Status Info: DNR Allergies Info: Losartan, Morphine And Related   Insulin Sliding Scale Info: see MAR       Current Medications (02/14/2021):  This is the current hospital active medication list Current Facility-Administered Medications  Medication Dose Route Frequency Provider Last Rate Last Admin   0.9 %  sodium chloride infusion   Intravenous Continuous Eugenie Filler, MD 125 mL/hr at 02/14/21 0942 New Bag at 02/14/21 0942   acetaminophen (TYLENOL) tablet 650 mg  650 mg Oral Q6H PRN Lenore Cordia, MD       Or   acetaminophen (TYLENOL) suppository 650 mg  650 mg Rectal Q6H PRN Lenore Cordia, MD       atorvastatin (LIPITOR) tablet 40 mg  40 mg Oral  Daily Darreld Mclean R, MD   40 mg at 02/14/21 0939   carvedilol (COREG) tablet 25 mg  25 mg Oral BID WC Darreld Mclean R, MD   25 mg at 02/14/21 0939   ezetimibe (ZETIA) tablet 10 mg  10 mg Oral Daily Darreld Mclean R, MD   10 mg at 02/14/21 0939   heparin injection 5,000 Units  5,000 Units Subcutaneous Q8H Darreld Mclean R, MD   5,000 Units at 02/14/21 0507   hydrocortisone cream 0.5 %   Topical BID Rodolph Bong, MD       insulin aspart (novoLOG) injection 0-9 Units  0-9 Units Subcutaneous TID WC Patel, Vishal R, MD       insulin glargine-yfgn (SEMGLEE)  injection 15 Units  15 Units Subcutaneous QHS Charlsie Quest, MD   15 Units at 02/14/21 0036   ondansetron (ZOFRAN) tablet 4 mg  4 mg Oral Q6H PRN Charlsie Quest, MD       Or   ondansetron (ZOFRAN) injection 4 mg  4 mg Intravenous Q6H PRN Charlsie Quest, MD       oseltamivir (TAMIFLU) capsule 30 mg  30 mg Oral BID Rodolph Bong, MD       oxyCODONE (Oxy IR/ROXICODONE) immediate release tablet 5 mg  5 mg Oral Once Rodolph Bong, MD       oxyCODONE-acetaminophen (PERCOCET) 7.5-325 MG per tablet 1 tablet  1 tablet Oral Q6H PRN Charlsie Quest, MD   1 tablet at 02/14/21 1247   pregabalin (LYRICA) capsule 75 mg  75 mg Oral BID Rodolph Bong, MD   75 mg at 02/14/21 1047   senna-docusate (Senokot-S) tablet 1 tablet  1 tablet Oral QHS PRN Charlsie Quest, MD         Discharge Medications: Please see discharge summary for a list of discharge medications.  Relevant Imaging Results:  Relevant Lab Results:   Additional Information [id number]; Pt is UNVACCINATED for COVID  Daylan Juhnke, LCSW

## 2021-02-15 DIAGNOSIS — E1151 Type 2 diabetes mellitus with diabetic peripheral angiopathy without gangrene: Secondary | ICD-10-CM | POA: Diagnosis not present

## 2021-02-15 DIAGNOSIS — N179 Acute kidney failure, unspecified: Secondary | ICD-10-CM | POA: Diagnosis not present

## 2021-02-15 DIAGNOSIS — E1169 Type 2 diabetes mellitus with other specified complication: Secondary | ICD-10-CM | POA: Diagnosis not present

## 2021-02-15 DIAGNOSIS — R531 Weakness: Secondary | ICD-10-CM | POA: Diagnosis not present

## 2021-02-15 DIAGNOSIS — E1159 Type 2 diabetes mellitus with other circulatory complications: Secondary | ICD-10-CM

## 2021-02-15 DIAGNOSIS — I152 Hypertension secondary to endocrine disorders: Secondary | ICD-10-CM

## 2021-02-15 LAB — RENAL FUNCTION PANEL
Albumin: 2.6 g/dL — ABNORMAL LOW (ref 3.5–5.0)
Anion gap: 7 (ref 5–15)
BUN: 38 mg/dL — ABNORMAL HIGH (ref 8–23)
CO2: 24 mmol/L (ref 22–32)
Calcium: 8 mg/dL — ABNORMAL LOW (ref 8.9–10.3)
Chloride: 112 mmol/L — ABNORMAL HIGH (ref 98–111)
Creatinine, Ser: 1.55 mg/dL — ABNORMAL HIGH (ref 0.44–1.00)
GFR, Estimated: 37 mL/min — ABNORMAL LOW (ref >60)
Glucose, Bld: 105 mg/dL — ABNORMAL HIGH (ref 70–99)
Phosphorus: 3 mg/dL (ref 2.5–4.6)
Potassium: 4.3 mmol/L (ref 3.5–5.1)
Sodium: 143 mmol/L (ref 135–145)

## 2021-02-15 LAB — CBC
HCT: 33.3 % — ABNORMAL LOW (ref 36.0–46.0)
Hemoglobin: 10.1 g/dL — ABNORMAL LOW (ref 12.0–15.0)
MCH: 29.2 pg (ref 26.0–34.0)
MCHC: 30.3 g/dL (ref 30.0–36.0)
MCV: 96.2 fL (ref 80.0–100.0)
Platelets: 148 10*3/uL — ABNORMAL LOW (ref 150–400)
RBC: 3.46 MIL/uL — ABNORMAL LOW (ref 3.87–5.11)
RDW: 13.8 % (ref 11.5–15.5)
WBC: 4.7 10*3/uL (ref 4.0–10.5)
nRBC: 0 % (ref 0.0–0.2)

## 2021-02-15 LAB — GLUCOSE, CAPILLARY
Glucose-Capillary: 83 mg/dL (ref 70–99)
Glucose-Capillary: 75 mg/dL (ref 70–99)
Glucose-Capillary: 104 mg/dL — ABNORMAL HIGH (ref 70–99)
Glucose-Capillary: 111 mg/dL — ABNORMAL HIGH (ref 70–99)

## 2021-02-15 LAB — MAGNESIUM: Magnesium: 2.3 mg/dL (ref 1.7–2.4)

## 2021-02-15 MED ORDER — OXYCODONE HCL 5 MG PO TABS
7.5000 mg | ORAL_TABLET | ORAL | Status: DC | PRN
  Administered 2021-02-15 – 2021-02-20 (×10): 7.5 mg via ORAL
  Filled 2021-02-15 (×11): qty 2

## 2021-02-15 MED ORDER — SODIUM CHLORIDE 0.45 % IV SOLN: INTRAVENOUS | Status: DC

## 2021-02-15 MED ORDER — OXYCODONE HCL 5 MG PO TABS
5.0000 mg | ORAL_TABLET | Freq: Once | ORAL | Status: AC
  Administered 2021-02-15: 5 mg via ORAL
  Filled 2021-02-15 (×2): qty 1

## 2021-02-15 MED ORDER — FENTANYL CITRATE PF 50 MCG/ML IJ SOSY
12.5000 ug | PREFILLED_SYRINGE | Freq: Once | INTRAMUSCULAR | Status: AC
  Administered 2021-02-15: 12.5 ug via INTRAVENOUS
  Filled 2021-02-15: qty 1

## 2021-02-15 NOTE — TOC Progression Note (Signed)
Transition of Care [facility]) - Progression Note    Patient Details  Name: Ajaya Crutchfield MRN: [id number] Date of Birth: 1956/02/12  [facility] CM/SW Contact  Amada Jupiter, LCSW Phone Number: 02/15/2021, 3:31 PM  Clinical Narrative:    Have reviewed SNF bed offers with pt and she has accepted bed at [facility]  Facility is agreed to admit pt on Tuesday once she has completed tamiflu tx for Flu A.     Expected Discharge Plan: Long Term Nursing Home Barriers to Discharge: SNF Pending bed offer  Expected Discharge Plan and Services Expected Discharge Plan: Long Term Nursing Home In-house Referral: Clinical Social Work     Living arrangements for the past 2 months: Single Family Home                 DME Arranged: N/A DME Agency: NA                   Social Determinants of Health (SDOH) Interventions    Readmission Risk Interventions No flowsheet data found.

## 2021-02-15 NOTE — Progress Notes (Signed)
PROGRESS NOTE    Shirley Chen  FIE:[id number] DOB: Oct 02, 1955 DOA: 02/13/2021 PCP: Olive Bass, FNP    Chief Complaint  Patient presents with   Weakness    Brief Narrative:  Patient is a pleasant 65 year old female history of HFpEF EF 60 to 65%, G2 DD by TTE 08/04/2020, insulin-dependent type 2 diabetes, hypertension, hyperlipidemia, peripheral vascular disease with history of necrotizing abscess status post left BKA, chronic back pain admitted with generalized weakness and acute kidney injury.  Noted to be positive for influenza A.   Assessment & Plan:   Principal Problem:   Acute kidney injury (HCC) Active Problems:   Influenza A   PAD (peripheral artery disease) (HCC)   Hypertension associated with diabetes (HCC)   Diabetes mellitus with peripheral vascular disease (HCC)   Hyperlipidemia associated with type 2 diabetes mellitus (HCC)   Weakness   AKI (acute kidney injury) (HCC)   1 acute kidney injury -Likely secondary to prerenal azotemia in the setting of diuretics of spironolactone and Lasix. -Patient clinically dry on examination. -Urinalysis with 30 of protein, nitrite negative leukocytes negative.  Urine sodium of 39, urine creatinine of 114.35. -Creatinine is slowly trending down currently at 1.88 from 1.98 on admission. -Last creatinine noted at 0.9 on 10/01/2020 -Renal ultrasound with borderline right hydronephrosis and possible trace perinephritic fluid.  Incomplete evaluation of the left kidney. -Urine output of 750 cc over the past 24 hours. -Renal function slowly improving creatinine currently at 1.55. -Change IV fluids to half-normal saline. -Continue hold diuretics of Lasix, spironolactone. -Avoid nephrotoxins.   2.  Influenza A -Likely etiology of patient's generalized weakness. -COVID-19 PCR negative. -Continue Tamiflu x5 days.  3.  Dehydration -Change IV fluids to half-normal saline.  4.  Elevated troponin -Troponin minimally  elevated and flattened likely in the setting of AKI. -Patient with no chest pain, EKG with no ischemic changes. -No further cardiac work-up needed at this time.  5.  Peripheral vascular disease status post left BKA -Zetia, statin.  6.  Insulin-dependent type 2 diabetes mellitus -Hemoglobin A1c 8.0 (11/102022). -CBG at 83. -Continue current dose of Semglee, SSI.  7.  Hypertension -Blood pressure borderline.   -Continue Coreg.   -Continue to hold Norvasc, hydralazine.   -IV fluids.    8.  Hyperlipidemia -Continue Zetia, statin.  9.  Chronic back pain -Continue home regimen Percocet as needed. -It is noted that patient stated she was on Lyrica instead of gabapentin and as such Neurontin discontinued and patient currently on Lyrica.  10.  Generalized weakness Likely secondary to problem #2. -PT/OT assessed patient and recommending SNF. -TOC consulted for SNF placement.  11.  HFpEF -2D echo from 08/04/2020 with EF of 60 to 65%, G2 DD.  Patient more on the dry side today. -Monitor volume status with fluid resuscitation. -Continue to hold spironolactone, Lasix.    DVT prophylaxis: Heparin Code Status: DNR Family Communication: Updated patient.  No family at bedside. Disposition:   Status is: Inpatient, due to severity of illness        Consultants:  None  Procedures:  Renal ultrasound 02/14/2021 Chest x-ray 02/13/2021   Antimicrobials:  Tamiflu 02/14/2021>>>>>>   Subjective: Patient laying in bed states she is not feeling well due to sciatic Pain.  No chest pain.  No shortness of breath.  No abdominal pain.  Tolerating current diet.   Objective: Vitals:   02/14/21 1535 02/14/21 2109 02/15/21 0442 02/15/21 0900  BP:  116/62 (!) 126/43 127/66  Pulse:  64 62  64  Resp:  18 18 20   Temp:  98.5 F (36.9 C) 98.3 F (36.8 C)   TempSrc:  Oral Oral   SpO2: 94% 97% 100% 98%  Weight:      Height:        Intake/Output Summary (Last 24 hours) at 02/15/2021  1057 Last data filed at 02/15/2021 0600 Gross per 24 hour  Intake 2858.79 ml  Output 750 ml  Net 2108.79 ml    Filed Weights   02/13/21 2359  Weight: 85.7 kg    Examination:  General exam: NAD.  Dry mucous membranes.  Respiratory system: Lungs clear to auscultation bilaterally.  No wheezes, no crackles, no rhonchi.  Normal respiratory effort. Cardiovascular system: Regular rate rhythm no murmurs rubs or gallops.  No JVD.  No lower extremity edema.  Gastrointestinal system: Abdomen is soft, nontender, nondistended, positive bowel sounds.  No rebound.  No guarding.  Central nervous system: Alert and oriented. No focal neurological deficits. Extremities: Status post left BKA. Skin: No rashes, lesions or ulcers Psychiatry: Judgement and insight appear normal. Mood & affect appropriate.     Data Reviewed: I have personally reviewed following labs and imaging studies  CBC: Recent Labs  Lab 02/13/21 2003 02/14/21 0311 02/15/21 0323  WBC 6.9 5.8 4.7  NEUTROABS 5.2  --   --   HGB 12.0 10.3* 10.1*  HCT 37.3 32.9* 33.3*  MCV 93.0 94.3 96.2  PLT 235 189 148*     Basic Metabolic Panel: Recent Labs  Lab 02/13/21 2003 02/14/21 0311 02/15/21 0323  NA 140 141 143  K 4.8 4.3 4.3  CL 105 108 112*  CO2 25 27 24   GLUCOSE 181* 152* 105*  BUN 53* 54* 38*  CREATININE 1.98* 1.88* 1.55*  CALCIUM 8.7* 8.1* 8.0*  MG 2.3  --  2.3  PHOS  --   --  3.0     GFR: Estimated Creatinine Clearance: 38.8 mL/min (A) (by C-G formula based on SCr of 1.55 mg/dL (H)).  Liver Function Tests: Recent Labs  Lab 02/15/21 0323  ALBUMIN 2.6*    CBG: Recent Labs  Lab 02/14/21 0805 02/14/21 1132 02/14/21 1707 02/14/21 2105 02/15/21 0948  GLUCAP 91 112* 97 104* 83      Recent Results (from the past 240 hour(s))  Culture, blood (routine x 2)     Status: None (Preliminary result)   Collection Time: 02/13/21  8:03 PM   Specimen: BLOOD  Result Value Ref Range Status   Specimen  Description   Final    BLOOD BLOOD LEFT HAND Performed at [facility], [address] [place], [place] [address]    Special Requests   Final    BOTTLES DRAWN AEROBIC AND ANAEROBIC Blood Culture adequate volume Performed at [facility], [address], [place], [place] [address]    Culture   Final    NO GROWTH 2 DAYS Performed at [facility], [address], [place], [place] [address]    Report Status PENDING  Incomplete  Culture, blood (routine x 2)     Status: None (Preliminary result)   Collection Time: 02/13/21  8:03 PM   Specimen: BLOOD  Result Value Ref Range Status   Specimen Description   Final    BLOOD RIGHT ANTECUBITAL Performed at [facility], [address] [place], [place] [address]    Special Requests   Final    BOTTLES DRAWN AEROBIC AND ANAEROBIC Blood Culture adequate volume Performed  at [facility], [address], [place], [place] [address]    Culture   Final    NO GROWTH 2 DAYS Performed at [facility], [address], [place], [place] [address]    Report Status PENDING  Incomplete  Resp Panel by RT-PCR (Flu A&B, Covid) Nasopharyngeal Swab     Status: Abnormal   Collection Time: 02/13/21  9:49 PM   Specimen: Nasopharyngeal Swab; Nasopharyngeal(NP) swabs in vial transport medium  Result Value Ref Range Status   SARS Coronavirus 2 by RT PCR NEGATIVE NEGATIVE Final    Comment: (NOTE) SARS-CoV-2 target nucleic acids are NOT DETECTED.  The SARS-CoV-2 RNA is generally detectable in upper respiratory specimens during the acute phase of infection. The lowest concentration of SARS-CoV-2 viral copies this assay can detect is 138 copies/mL. A negative result does not preclude SARS-Cov-2 infection and should not be used as the sole basis for treatment or other patient management decisions. A negative result may occur with  improper specimen collection/handling, submission  of specimen other than nasopharyngeal swab, presence of viral mutation(s) within the areas targeted by this assay, and inadequate number of viral copies(<138 copies/mL). A negative result must be combined with clinical observations, patient history, and epidemiological information. The expected result is Negative.  Fact Sheet for Patients:  BloggerCourse.com  Fact Sheet for Healthcare Providers:  SeriousBroker.it  This test is no t yet approved or cleared by the [place] FDA and  has been authorized for detection and/or diagnosis of SARS-CoV-2 by FDA under an Emergency Use Authorization (EUA). This EUA will remain  in effect (meaning this test can be used) for the duration of the COVID-19 declaration under Section 564(b)(1) of the Act, 21 U.S.C.section 360bbb-3(b)(1), unless the authorization is terminated  or revoked sooner.       Influenza A by PCR POSITIVE (A) NEGATIVE Final   Influenza B by PCR NEGATIVE NEGATIVE Final    Comment: (NOTE) The Xpert Xpress SARS-CoV-2/FLU/RSV plus assay is intended as an aid in the diagnosis of influenza from Nasopharyngeal swab specimens and should not be used as a sole basis for treatment. Nasal washings and aspirates are unacceptable for Xpert Xpress SARS-CoV-2/FLU/RSV testing.  Fact Sheet for Patients: BloggerCourse.com  Fact Sheet for Healthcare Providers: SeriousBroker.it  This test is not yet approved or cleared by the [place] FDA and has been authorized for detection and/or diagnosis of SARS-CoV-2 by FDA under an Emergency Use Authorization (EUA). This EUA will remain in effect (meaning this test can be used) for the duration of the COVID-19 declaration under Section 564(b)(1) of the Act, 21 U.S.C. section 360bbb-3(b)(1), unless the authorization is terminated or revoked.  Performed at [facility], [address] [place], [place] [address]   Urine Culture     Status: Abnormal   Collection Time: 02/13/21 11:00 PM   Specimen: Urine, Clean Catch  Result Value Ref Range Status   Specimen Description   Final    URINE, CLEAN CATCH Performed at [facility], [address] [place], [place] [address]    Special Requests   Final    NONE Performed at [facility], [address] [place], [place] [address]    Culture (A)  Final    <10,000 COLONIES/mL INSIGNIFICANT GROWTH Performed at [facility], [address], [place], [place] [address]    Report Status 02/14/2021 FINAL  Final          Radiology  Studies: US RENAL  Result Date: 02/14/2021 CLINICAL DATA:  Acute renal failure. EXAM: RENAL / URINARY TRACT ULTRASOUND COMPLETE COMPARISON:  Renal ultrasound 08/03/2020 FINDINGS: Right Kidney: Renal measurements: 11.1 x 5.5 x 5.4 cm = volume: 173 mL. Echogenicity within normal limits. Borderline hydronephrosis. No mass. Possible trace perinephric fluid. Left Kidney: Renal measurements: 10.0 x 4.8 cm. The patient terminated the examination after only limited images of the left kidney were obtained, precluding adequate evaluation for mass or hydronephrosis. Bladder: Appears normal for degree of bladder distention. Other: None. IMPRESSION: 1. Borderline right hydronephrosis and possible trace perinephric fluid. 2. Incomplete evaluation of the left kidney. Electronically Signed   By: Sebastian Ache M.D.   On: 02/14/2021 13:25   DG Chest Port 1 View  Result Date: 02/13/2021 CLINICAL DATA:  Increased weakness EXAM: PORTABLE CHEST 1 VIEW COMPARISON:  Chest x-ray 08/05/2020 FINDINGS: The heart and mediastinal contours are within normal limits. Aortic calcification. Similar-appearing hilar calcification likely sequelae of prior granulomatous disease. No focal consolidation. No pulmonary edema. No pleural effusion. No pneumothorax. No acute osseous abnormality.  IMPRESSION: No active disease. Electronically Signed   By: Tish Frederickson M.D.   On: 02/13/2021 20:20        Scheduled Meds:  atorvastatin  40 mg Oral Daily   carvedilol  25 mg Oral BID WC   ezetimibe  10 mg Oral Daily   heparin  5,000 Units Subcutaneous Q8H   hydrocortisone cream   Topical BID   insulin aspart  0-9 Units Subcutaneous TID WC   insulin glargine-yfgn  15 Units Subcutaneous QHS   oseltamivir  30 mg Oral BID   pregabalin  75 mg Oral BID   Continuous Infusions:  sodium chloride       LOS: 1 day    Time spent: 40 minutes    Ramiro Harvest, MD [facility]   To contact the attending provider between 7A-7P or the covering provider during after hours 7P-7A, please log into the web site [email or web address] and access using universal [place] password for that web site. If you do not have the password, please call the hospital operator.  02/15/2021, 10:57 AM

## 2021-02-15 NOTE — Plan of Care (Signed)
  Problem: Education: Goal: Knowledge of General Education information will improve Description: Including pain rating scale, medication(s)/side effects and non-pharmacologic comfort measures Outcome: Progressing   Problem: Pain Managment: Goal: General experience of comfort will improve Outcome: Progressing   Problem: Safety: Goal: Ability to remain free from injury will improve Outcome: Progressing   

## 2021-02-16 DIAGNOSIS — N179 Acute kidney failure, unspecified: Secondary | ICD-10-CM | POA: Diagnosis not present

## 2021-02-16 DIAGNOSIS — E1169 Type 2 diabetes mellitus with other specified complication: Secondary | ICD-10-CM | POA: Diagnosis not present

## 2021-02-16 DIAGNOSIS — E1151 Type 2 diabetes mellitus with diabetic peripheral angiopathy without gangrene: Secondary | ICD-10-CM | POA: Diagnosis not present

## 2021-02-16 DIAGNOSIS — R531 Weakness: Secondary | ICD-10-CM | POA: Diagnosis not present

## 2021-02-16 LAB — CBC WITH DIFFERENTIAL/PLATELET
Abs Immature Granulocytes: 0.02 10*3/uL (ref 0.00–0.07)
Basophils Absolute: 0 10*3/uL (ref 0.0–0.1)
Basophils Relative: 0 %
Eosinophils Absolute: 0.1 10*3/uL (ref 0.0–0.5)
Eosinophils Relative: 1 %
HCT: 32.5 % — ABNORMAL LOW (ref 36.0–46.0)
Hemoglobin: 10.3 g/dL — ABNORMAL LOW (ref 12.0–15.0)
Immature Granulocytes: 1 %
Lymphocytes Relative: 42 %
Lymphs Abs: 1.8 10*3/uL (ref 0.7–4.0)
MCH: 30.1 pg (ref 26.0–34.0)
MCHC: 31.7 g/dL (ref 30.0–36.0)
MCV: 95 fL (ref 80.0–100.0)
Monocytes Absolute: 0.6 10*3/uL (ref 0.1–1.0)
Monocytes Relative: 14 %
Neutro Abs: 1.8 10*3/uL (ref 1.7–7.7)
Neutrophils Relative %: 42 %
Platelets: 148 10*3/uL — ABNORMAL LOW (ref 150–400)
RBC: 3.42 MIL/uL — ABNORMAL LOW (ref 3.87–5.11)
RDW: 13.8 % (ref 11.5–15.5)
WBC: 4.3 10*3/uL (ref 4.0–10.5)
nRBC: 0 % (ref 0.0–0.2)

## 2021-02-16 LAB — RENAL FUNCTION PANEL
Albumin: 2.7 g/dL — ABNORMAL LOW (ref 3.5–5.0)
Anion gap: 4 — ABNORMAL LOW (ref 5–15)
BUN: 25 mg/dL — ABNORMAL HIGH (ref 8–23)
CO2: 25 mmol/L (ref 22–32)
Calcium: 8.1 mg/dL — ABNORMAL LOW (ref 8.9–10.3)
Chloride: 111 mmol/L (ref 98–111)
Creatinine, Ser: 1.37 mg/dL — ABNORMAL HIGH (ref 0.44–1.00)
GFR, Estimated: 43 mL/min — ABNORMAL LOW (ref >60)
Glucose, Bld: 134 mg/dL — ABNORMAL HIGH (ref 70–99)
Phosphorus: 2.5 mg/dL (ref 2.5–4.6)
Potassium: 3.9 mmol/L (ref 3.5–5.1)
Sodium: 140 mmol/L (ref 135–145)

## 2021-02-16 LAB — GLUCOSE, CAPILLARY
Glucose-Capillary: 120 mg/dL — ABNORMAL HIGH (ref 70–99)
Glucose-Capillary: 124 mg/dL — ABNORMAL HIGH (ref 70–99)
Glucose-Capillary: 108 mg/dL — ABNORMAL HIGH (ref 70–99)
Glucose-Capillary: 227 mg/dL — ABNORMAL HIGH (ref 70–99)
Glucose-Capillary: 261 mg/dL — ABNORMAL HIGH (ref 70–99)

## 2021-02-16 MED ORDER — IPRATROPIUM-ALBUTEROL 0.5-2.5 (3) MG/3ML IN SOLN
3.0000 mL | Freq: Three times a day (TID) | RESPIRATORY_TRACT | Status: DC
  Administered 2021-02-16 – 2021-02-18 (×5): 3 mL via RESPIRATORY_TRACT
  Filled 2021-02-16 (×6): qty 3

## 2021-02-16 MED ORDER — TRAMADOL HCL 50 MG PO TABS
50.0000 mg | ORAL_TABLET | Freq: Three times a day (TID) | ORAL | Status: DC
  Administered 2021-02-16 – 2021-02-18 (×8): 50 mg via ORAL
  Filled 2021-02-16 (×8): qty 1

## 2021-02-16 MED ORDER — LORATADINE 10 MG PO TABS
10.0000 mg | ORAL_TABLET | Freq: Every day | ORAL | Status: DC
  Administered 2021-02-16 – 2021-02-20 (×5): 10 mg via ORAL
  Filled 2021-02-16 (×5): qty 1

## 2021-02-16 MED ORDER — FLUTICASONE PROPIONATE 50 MCG/ACT NA SUSP
2.0000 | Freq: Every day | NASAL | Status: DC
  Administered 2021-02-16 – 2021-02-20 (×5): 2 via NASAL
  Filled 2021-02-16: qty 16

## 2021-02-16 MED ORDER — PREDNISONE 20 MG PO TABS
40.0000 mg | ORAL_TABLET | Freq: Once | ORAL | Status: AC
  Administered 2021-02-16: 40 mg via ORAL
  Filled 2021-02-16: qty 2

## 2021-02-16 MED ORDER — MOMETASONE FURO-FORMOTEROL FUM 200-5 MCG/ACT IN AERO
2.0000 | INHALATION_SPRAY | Freq: Two times a day (BID) | RESPIRATORY_TRACT | Status: DC
  Administered 2021-02-16 – 2021-02-20 (×9): 2 via RESPIRATORY_TRACT
  Filled 2021-02-16: qty 8.8

## 2021-02-16 MED ORDER — GUAIFENESIN ER 600 MG PO TB12
1200.0000 mg | ORAL_TABLET | Freq: Two times a day (BID) | ORAL | Status: DC
  Administered 2021-02-16 – 2021-02-20 (×9): 1200 mg via ORAL
  Filled 2021-02-16 (×9): qty 2

## 2021-02-16 NOTE — Plan of Care (Signed)
  Problem: Clinical Measurements: Goal: Ability to maintain clinical measurements within normal limits will improve Outcome: Progressing   Problem: Clinical Measurements: Goal: Will remain free from infection Outcome: Progressing   Problem: Activity: Goal: Risk for activity intolerance will decrease Outcome: Progressing   

## 2021-02-16 NOTE — Progress Notes (Signed)
PROGRESS NOTE    Shirley Chen  IRS:[id number] DOB: 1955-12-18 DOA: 02/13/2021 PCP: Olive Bass, FNP    Chief Complaint  Patient presents with   Weakness    Brief Narrative:  Patient is a pleasant 65 year old female history of HFpEF EF 60 to 65%, G2 DD by TTE 08/04/2020, insulin-dependent type 2 diabetes, hypertension, hyperlipidemia, peripheral vascular disease with history of necrotizing abscess status post left BKA, chronic back pain admitted with generalized weakness and acute kidney injury.  Noted to be positive for influenza A.   Assessment & Plan:   Principal Problem:   Acute kidney injury (HCC) Active Problems:   Influenza A   PAD (peripheral artery disease) (HCC)   Hypertension associated with diabetes (HCC)   Diabetes mellitus with peripheral vascular disease (HCC)   Hyperlipidemia associated with type 2 diabetes mellitus (HCC)   Weakness   AKI (acute kidney injury) (HCC)   1 acute kidney injury -Likely secondary to prerenal azotemia in the setting of diuretics of spironolactone and Lasix. -Patient clinically dry on examination however slowly improving. -Urinalysis with 30 of protein, nitrite negative leukocytes negative.  Urine sodium of 39, urine creatinine of 114.35. -Creatinine is slowly trending down currently at 1.37 from 1.55 from 1.88 from 1.98 on admission. -Last creatinine noted at 0.9 on 10/01/2020 -Renal ultrasound with borderline right hydronephrosis and possible trace perinephritic fluid.  Incomplete evaluation of the left kidney. -Urine output of 2000 cc over the past 24 hours. -Renal function slowly improving creatinine currently at 1. 3 7. -Decrease IV fluid rates to 100 cc/h. -Continue hold diuretics of Lasix, spironolactone. -Avoid nephrotoxins.   2.  Influenza A -Likely etiology of patient's generalized weakness. -Patient with complaints of congestion, noted to have minimal expiratory wheezing on examination. -COVID-19 PCR  negative. -Continue Tamiflu x5 days. -Place on Mucinex, Claritin, Flonase, bronchodilators.  3.  Dehydration -Decrease IV fluid rate.   4.  Elevated troponin -Troponin minimally elevated and flattened likely in the setting of AKI. -Patient with no chest pain, EKG with no ischemic changes. -No further cardiac work-up needed at this time.  5.  Peripheral vascular disease status post left BKA -Zetia, statin.  6.  Insulin-dependent type 2 diabetes mellitus -Hemoglobin A1c 8.0 (11/102022). -CBG at 120. -Continue current dose of Semglee, SSI.  7.  Hypertension -Blood pressure borderline but improving.   -Continue Coreg.   -Continue to hold Norvasc, hydralazine.   -IV fluids.  8.  Hyperlipidemia -Statin, Zetia.  9.  Chronic back pain -Continue Lyrica.   -Percocet changed to oxycodone 7.5 mg every 4 hours as needed pain.  -Place on scheduled Ultram 50 mg 3 times daily x3 days.   -Supportive care.   10.  Generalized weakness Likely secondary to problem #2. -PT/OT assessed patient and recommending SNF. -TOC consulted for SNF placement.  11.  HFpEF -2D echo from 08/04/2020 with EF of 60 to 65%, G2 DD.  Patient more on the dry side. -Monitor volume status with fluid resuscitation. -Continue to hold spironolactone, Lasix. -Decrease IV fluid rate.    DVT prophylaxis: Heparin Code Status: DNR Family Communication: Updated patient.  No family at bedside. Disposition:   Status is: Inpatient, due to severity of illness        Consultants:  None  Procedures:  Renal ultrasound 02/14/2021 Chest x-ray 02/13/2021   Antimicrobials:  Tamiflu 02/14/2021>>>>>>   Subjective: Laying in bed.  Still with some sciatic pain however slightly better than it was.  No chest pain.  No shortness of  breath.  Complain of feeling congested.  Tolerating current diet.    Objective: Vitals:   02/15/21 0900 02/15/21 1213 02/15/21 2058 02/16/21 0516  BP: 127/66 (!) 112/55 (!) 116/51  135/63  Pulse: 64 (!) 58 61 62  Resp: 20 14 16 16   Temp:  98 F (36.7 C) 98.3 F (36.8 C) 97.8 F (36.6 C)  TempSrc:  Oral  Oral  SpO2: 98% 94% 98% 95%  Weight:      Height:        Intake/Output Summary (Last 24 hours) at 02/16/2021 1008 Last data filed at 02/16/2021 0600 Gross per 24 hour  Intake 2709.47 ml  Output 2000 ml  Net 709.47 ml    Filed Weights   02/13/21 2359  Weight: 85.7 kg    Examination:  General exam: NAD.  Congested. Respiratory system: Minimal expiratory wheezing.  Some coarse breath sounds.  No crackles.  Fair air movement.  Congested.  Cardiovascular system: RRR no murmurs rubs or gallops.  No JVD.  No lower extremity edema.  Gastrointestinal system: Abdomen is soft, nontender, nondistended, positive bowel sounds.  No rebound.  No guarding. Central nervous system: Alert and oriented. No focal neurological deficits. Extremities: Status post left BKA. Skin: No rashes, lesions or ulcers Psychiatry: Judgement and insight appear normal. Mood & affect appropriate.     Data Reviewed: I have personally reviewed following labs and imaging studies  CBC: Recent Labs  Lab 02/13/21 2003 02/14/21 0311 02/15/21 0323 02/16/21 0357  WBC 6.9 5.8 4.7 4.3  NEUTROABS 5.2  --   --  1.8  HGB 12.0 10.3* 10.1* 10.3*  HCT 37.3 32.9* 33.3* 32.5*  MCV 93.0 94.3 96.2 95.0  PLT 235 189 148* 148*     Basic Metabolic Panel: Recent Labs  Lab 02/13/21 2003 02/14/21 0311 02/15/21 0323 02/16/21 0357  NA 140 141 143 140  K 4.8 4.3 4.3 3.9  CL 105 108 112* 111  CO2 25 27 24 25   GLUCOSE 181* 152* 105* 134*  BUN 53* 54* 38* 25*  CREATININE 1.98* 1.88* 1.55* 1.37*  CALCIUM 8.7* 8.1* 8.0* 8.1*  MG 2.3  --  2.3  --   PHOS  --   --  3.0 2.5     GFR: Estimated Creatinine Clearance: 43.9 mL/min (A) (by C-G formula based on SCr of 1.37 mg/dL (H)).  Liver Function Tests: Recent Labs  Lab 02/15/21 0323 02/16/21 0357  ALBUMIN 2.6* 2.7*     CBG: Recent Labs   Lab 02/15/21 1210 02/15/21 1644 02/15/21 1955 02/16/21 0734 02/16/21 0823  GLUCAP 75 104* 111* 120* 124*      Recent Results (from the past 240 hour(s))  Culture, blood (routine x 2)     Status: None (Preliminary result)   Collection Time: 02/13/21  8:03 PM   Specimen: BLOOD  Result Value Ref Range Status   Specimen Description   Final    BLOOD BLOOD LEFT HAND Performed at [facility], [address] [place], [place]    Special Requests   Final    BOTTLES DRAWN AEROBIC AND ANAEROBIC Blood Culture adequate volume Performed at [facility], [address] [place], [place]    Culture   Final    NO GROWTH 3 DAYS Performed at [facility], [address] [place], [address] [place]    Report Status PENDING  Incomplete  Culture, blood (routine x 2)     Status: None (Preliminary result)  Collection Time: 02/13/21  8:03 PM   Specimen: BLOOD  Result Value Ref Range Status   Specimen Description   Final    BLOOD RIGHT ANTECUBITAL Performed at [facility], [address], [place], [place] [address]    Special Requests   Final    BOTTLES DRAWN AEROBIC AND ANAEROBIC Blood Culture adequate volume Performed at [facility], [address], [place], [place] [address]    Culture   Final    NO GROWTH 3 DAYS Performed at [facility], [address] [place], [place] [address]    Report Status PENDING  Incomplete  Resp Panel by RT-PCR (Flu A&B, Covid) Nasopharyngeal Swab     Status: Abnormal   Collection Time: 02/13/21  9:49 PM   Specimen: Nasopharyngeal Swab; Nasopharyngeal(NP) swabs in vial transport medium  Result Value Ref Range Status   SARS Coronavirus 2 by RT PCR NEGATIVE NEGATIVE Final    Comment: (NOTE) SARS-CoV-2 target nucleic acids are NOT DETECTED.  The SARS-CoV-2 RNA is generally detectable in upper respiratory specimens during the acute phase of  infection. The lowest concentration of SARS-CoV-2 viral copies this assay can detect is 138 copies/mL. A negative result does not preclude SARS-Cov-2 infection and should not be used as the sole basis for treatment or other patient management decisions. A negative result may occur with  improper specimen collection/handling, submission of specimen other than nasopharyngeal swab, presence of viral mutation(s) within the areas targeted by this assay, and inadequate number of viral copies(<138 copies/mL). A negative result must be combined with clinical observations, patient history, and epidemiological information. The expected result is Negative.  Fact Sheet for Patients:  BloggerCourse.com  Fact Sheet for Healthcare Providers:  SeriousBroker.it  This test is no t yet approved or cleared by the [place] FDA and  has been authorized for detection and/or diagnosis of SARS-CoV-2 by FDA under an Emergency Use Authorization (EUA). This EUA will remain  in effect (meaning this test can be used) for the duration of the COVID-19 declaration under Section 564(b)(1) of the Act, 21 U.S.C.section 360bbb-3(b)(1), unless the authorization is terminated  or revoked sooner.       Influenza A by PCR POSITIVE (A) NEGATIVE Final   Influenza B by PCR NEGATIVE NEGATIVE Final    Comment: (NOTE) The Xpert Xpress SARS-CoV-2/FLU/RSV plus assay is intended as an aid in the diagnosis of influenza from Nasopharyngeal swab specimens and should not be used as a sole basis for treatment. Nasal washings and aspirates are unacceptable for Xpert Xpress SARS-CoV-2/FLU/RSV testing.  Fact Sheet for Patients: BloggerCourse.com  Fact Sheet for Healthcare Providers: SeriousBroker.it  This test is not yet approved or cleared by the [place] FDA and has been authorized for detection and/or diagnosis of  SARS-CoV-2 by FDA under an Emergency Use Authorization (EUA). This EUA will remain in effect (meaning this test can be used) for the duration of the COVID-19 declaration under Section 564(b)(1) of the Act, 21 U.S.C. section 360bbb-3(b)(1), unless the authorization is terminated or revoked.  Performed at [facility], [address] [place], [place] [address]   Urine Culture     Status: Abnormal   Collection Time: 02/13/21 11:00 PM   Specimen: Urine, Clean Catch  Result Value Ref Range Status   Specimen Description   Final    URINE, CLEAN CATCH Performed at [facility], [address], [place], [place] [address]    Special Requests   Final  NONE Performed at [facility], [address] [place], [place] [address]    Culture (A)  Final    <10,000 COLONIES/mL INSIGNIFICANT GROWTH Performed at [facility], [address] [place], [place] [address]    Report Status 02/14/2021 FINAL  Final          Radiology Studies: US RENAL  Result Date: 02/14/2021 CLINICAL DATA:  Acute renal failure. EXAM: RENAL / URINARY TRACT ULTRASOUND COMPLETE COMPARISON:  Renal ultrasound 08/03/2020 FINDINGS: Right Kidney: Renal measurements: 11.1 x 5.5 x 5.4 cm = volume: 173 mL. Echogenicity within normal limits. Borderline hydronephrosis. No mass. Possible trace perinephric fluid. Left Kidney: Renal measurements: 10.0 x 4.8 cm. The patient terminated the examination after only limited images of the left kidney were obtained, precluding adequate evaluation for mass or hydronephrosis. Bladder: Appears normal for degree of bladder distention. Other: None. IMPRESSION: 1. Borderline right hydronephrosis and possible trace perinephric fluid. 2. Incomplete evaluation of the left kidney. Electronically Signed   By: Sebastian Ache M.D.   On: 02/14/2021 13:25        Scheduled Meds:  atorvastatin  40 mg Oral Daily   carvedilol  25 mg Oral BID  WC   ezetimibe  10 mg Oral Daily   heparin  5,000 Units Subcutaneous Q8H   hydrocortisone cream   Topical BID   insulin aspart  0-9 Units Subcutaneous TID WC   insulin glargine-yfgn  15 Units Subcutaneous QHS   oseltamivir  30 mg Oral BID   pregabalin  75 mg Oral BID   Continuous Infusions:  sodium chloride 125 mL/hr at 02/16/21 0221     LOS: 2 days    Time spent: 35 minutes    Ramiro Harvest, MD [facility]   To contact the attending provider between 7A-7P or the covering provider during after hours 7P-7A, please log into the web site [email or web address] and access using universal [place] password for that web site. If you do not have the password, please call the hospital operator.  02/16/2021, 10:08 AM

## 2021-02-16 NOTE — Plan of Care (Signed)
  Problem: Education: Goal: Knowledge of General Education information will improve Description: Including pain rating scale, medication(s)/side effects and non-pharmacologic comfort measures Outcome: Progressing   Problem: Pain Managment: Goal: General experience of comfort will improve Outcome: Progressing   Problem: Safety: Goal: Ability to remain free from injury will improve Outcome: Progressing   

## 2021-02-17 DIAGNOSIS — E1169 Type 2 diabetes mellitus with other specified complication: Secondary | ICD-10-CM | POA: Diagnosis not present

## 2021-02-17 DIAGNOSIS — R531 Weakness: Secondary | ICD-10-CM | POA: Diagnosis not present

## 2021-02-17 DIAGNOSIS — E1151 Type 2 diabetes mellitus with diabetic peripheral angiopathy without gangrene: Secondary | ICD-10-CM | POA: Diagnosis not present

## 2021-02-17 DIAGNOSIS — N179 Acute kidney failure, unspecified: Secondary | ICD-10-CM | POA: Diagnosis not present

## 2021-02-17 LAB — RENAL FUNCTION PANEL
Albumin: 3.2 g/dL — ABNORMAL LOW (ref 3.5–5.0)
Anion gap: 6 (ref 5–15)
BUN: 21 mg/dL (ref 8–23)
CO2: 26 mmol/L (ref 22–32)
Calcium: 8.6 mg/dL — ABNORMAL LOW (ref 8.9–10.3)
Chloride: 106 mmol/L (ref 98–111)
Creatinine, Ser: 1.1 mg/dL — ABNORMAL HIGH (ref 0.44–1.00)
GFR, Estimated: 56 mL/min — ABNORMAL LOW (ref >60)
Glucose, Bld: 237 mg/dL — ABNORMAL HIGH (ref 70–99)
Phosphorus: 2.2 mg/dL — ABNORMAL LOW (ref 2.5–4.6)
Potassium: 4 mmol/L (ref 3.5–5.1)
Sodium: 138 mmol/L (ref 135–145)

## 2021-02-17 LAB — CBC
HCT: 36.6 % (ref 36.0–46.0)
Hemoglobin: 11.7 g/dL — ABNORMAL LOW (ref 12.0–15.0)
MCH: 29.3 pg (ref 26.0–34.0)
MCHC: 32 g/dL (ref 30.0–36.0)
MCV: 91.7 fL (ref 80.0–100.0)
Platelets: 157 10*3/uL (ref 150–400)
RBC: 3.99 MIL/uL (ref 3.87–5.11)
RDW: 13 % (ref 11.5–15.5)
WBC: 2.8 10*3/uL — ABNORMAL LOW (ref 4.0–10.5)
nRBC: 0 % (ref 0.0–0.2)

## 2021-02-17 LAB — GLUCOSE, CAPILLARY
Glucose-Capillary: 196 mg/dL — ABNORMAL HIGH (ref 70–99)
Glucose-Capillary: 156 mg/dL — ABNORMAL HIGH (ref 70–99)
Glucose-Capillary: 158 mg/dL — ABNORMAL HIGH (ref 70–99)

## 2021-02-17 LAB — MAGNESIUM: Magnesium: 1.9 mg/dL (ref 1.7–2.4)

## 2021-02-17 MED ORDER — HYDRALAZINE HCL 50 MG PO TABS
50.0000 mg | ORAL_TABLET | Freq: Three times a day (TID) | ORAL | Status: DC
  Administered 2021-02-17 – 2021-02-20 (×9): 50 mg via ORAL
  Filled 2021-02-17 (×10): qty 1

## 2021-02-17 MED ORDER — POTASSIUM & SODIUM PHOSPHATES 280-160-250 MG PO PACK
1.0000 | PACK | Freq: Three times a day (TID) | ORAL | Status: DC
  Administered 2021-02-17 – 2021-02-20 (×11): 1 via ORAL
  Filled 2021-02-17 (×14): qty 1

## 2021-02-17 MED ORDER — INSULIN GLARGINE-YFGN 100 UNIT/ML ~~LOC~~ SOLN
18.0000 [IU] | Freq: Every day | SUBCUTANEOUS | Status: DC
  Administered 2021-02-17 – 2021-02-19 (×3): 18 [IU] via SUBCUTANEOUS
  Filled 2021-02-17 (×4): qty 0.18

## 2021-02-17 MED ORDER — AMLODIPINE BESYLATE 5 MG PO TABS
5.0000 mg | ORAL_TABLET | Freq: Every day | ORAL | Status: DC
  Administered 2021-02-17: 5 mg via ORAL
  Filled 2021-02-17 (×2): qty 1

## 2021-02-17 MED ORDER — HYDRALAZINE HCL 20 MG/ML IJ SOLN: 10.0000 mg | Freq: Four times a day (QID) | INTRAMUSCULAR | Status: DC | PRN

## 2021-02-17 MED ORDER — AMLODIPINE BESYLATE 10 MG PO TABS
10.0000 mg | ORAL_TABLET | Freq: Every day | ORAL | Status: DC
  Administered 2021-02-18 – 2021-02-20 (×3): 10 mg via ORAL
  Filled 2021-02-17 (×3): qty 1

## 2021-02-17 MED ORDER — COLCHICINE 0.6 MG PO TABS
0.6000 mg | ORAL_TABLET | Freq: Every day | ORAL | Status: DC
  Administered 2021-02-17 – 2021-02-20 (×4): 0.6 mg via ORAL
  Filled 2021-02-17 (×5): qty 1

## 2021-02-17 NOTE — Progress Notes (Signed)
PROGRESS NOTE    Shirley Chen  PZW:[id number] DOB: August 02, 1955 DOA: 02/13/2021 PCP: Olive Bass, FNP    Chief Complaint  Patient presents with   Weakness    Brief Narrative:  Patient is a pleasant 65 year old female history of HFpEF EF 60 to 65%, G2 DD by TTE 08/04/2020, insulin-dependent type 2 diabetes, hypertension, hyperlipidemia, peripheral vascular disease with history of necrotizing abscess status post left BKA, chronic back pain admitted with generalized weakness and acute kidney injury.  Noted to be positive for influenza A.  Patient placed on Tamiflu, IV fluids, pain management for chronic pain and sciatica.   Assessment & Plan:   Principal Problem:   Acute kidney injury (HCC) Active Problems:   Influenza A   PAD (peripheral artery disease) (HCC)   Hypertension associated with diabetes (HCC)   Diabetes mellitus with peripheral vascular disease (HCC)   Hyperlipidemia associated with type 2 diabetes mellitus (HCC)   Acute renal failure (HCC)   Weakness   AKI (acute kidney injury) (HCC)   1 acute kidney injury -Likely secondary to prerenal azotemia in the setting of diuretics of spironolactone and Lasix. -Patient on admission noted to be clinically dry on examination but has improved. -Urinalysis with 30 of protein, nitrite negative leukocytes negative.  Urine sodium of 39, urine creatinine of 114.35. -Creatinine is slowly trending down currently at 1.10 from 1.37 from 1.55 from 1.88 from 1.98 on admission. -Last creatinine noted at 0.9 on 10/01/2020 -Renal ultrasound with borderline right hydronephrosis and possible trace perinephritic fluid.  Incomplete evaluation of the left kidney. -Urine output 2.9 L over the past 24 hours . -Saline lock IV fluids.   -Continue to hold Lasix, spironolactone. -Avoid nephrotoxic agents.    2.  Influenza A -Likely etiology of patient's generalized weakness. -Patient with complaints of congestion, noted to have minimal  expiratory wheezing on examination which has improved over the past 24 hours.. -COVID-19 PCR negative. -Continue Tamiflu to complete a 5-day course.   -Continue Mucinex, Claritin, Flonase, bronchodilators.  3.  Dehydration -Saline lock IV fluids.   4.  Elevated troponin -Troponin minimally elevated and flattened likely in the setting of AKI. -Patient with no chest pain, EKG with no ischemic changes. -No further cardiac work-up needed at this time.  5.  Peripheral vascular disease status post left BKA -Statin, Zetia.   6.  Insulin-dependent type 2 diabetes mellitus -Hemoglobin A1c 8.0 (11/102022). -CBG 196 this morning . -Semglee, SSI.  7.  Hypertension -Blood pressure was borderline but has improved.   -Now noted to be hypertensive this morning.   -Continue Coreg.  Resume home regimen Norvasc, hydralazine.   -Saline lock IV fluids.   -Continue to hold spironolactone, Lasix.    8.  Hyperlipidemia -Statin, Zetia.  9.  Chronic back pain/sciatica -Improved on scheduled Ultram and oxycodone as needed. -Continue Lyrica. -Supportive care. -Continue Lyrica.    10.  Generalized weakness Likely secondary to problem #2. -PT/OT assessed patient and recommending SNF. -TOC consulted for SNF placement.  11.  HFpEF -2D echo from 08/04/2020 with EF of 60 to 65%, G2 DD.  Patient more on the dry side. -Saline lock IV fluids.   -Continue to hold Lasix, spironolactone.     DVT prophylaxis: Heparin Code Status: DNR Family Communication: Updated patient.  No family at bedside. Disposition:   Status is: Inpatient, due to severity of illness        Consultants:  None  Procedures:  Renal ultrasound 02/14/2021 Chest x-ray 02/13/2021   Antimicrobials:  Tamiflu 02/14/2021>>>>>>   Subjective: Sitting up in chair.  States sciatica pain improved with addition of Ultram.  No chest pain.  No shortness of breath.  Overall feeling better.  Tolerating current diet.    Objective: Vitals:   02/16/21 2133 02/17/21 0627 02/17/21 0738 02/17/21 0740  BP:  (!) 192/70    Pulse:  63    Resp:  17    Temp:  98 F (36.7 C)    TempSrc:      SpO2: 97% 96% 95% 96%  Weight:      Height:        Intake/Output Summary (Last 24 hours) at 02/17/2021 1414 Last data filed at 02/17/2021 1110 Gross per 24 hour  Intake 2033.51 ml  Output 3200 ml  Net -1166.49 ml    Filed Weights   02/13/21 2359  Weight: 85.7 kg    Examination:  General exam: NAD. Respiratory system: Lungs clear to auscultation bilaterally.  No wheezes, no crackles, no rhonchi.  Fair air movement.   Cardiovascular system: Regular rate and rhythm no murmurs rubs or gallops.  No JVD.  No lower extremity edema.  Gastrointestinal system: Abdomen is soft, nontender, nondistended, positive bowel sounds.  No rebound.  No guarding.   Central nervous system: Alert and oriented. No focal neurological deficits. Extremities: Status post left BKA. Skin: No rashes, lesions or ulcers Psychiatry: Judgement and insight appear normal. Mood & affect appropriate.     Data Reviewed: I have personally reviewed following labs and imaging studies  CBC: Recent Labs  Lab 02/13/21 2003 02/14/21 0311 02/15/21 0323 02/16/21 0357 02/17/21 0324  WBC 6.9 5.8 4.7 4.3 2.8*  NEUTROABS 5.2  --   --  1.8  --   HGB 12.0 10.3* 10.1* 10.3* 11.7*  HCT 37.3 32.9* 33.3* 32.5* 36.6  MCV 93.0 94.3 96.2 95.0 91.7  PLT 235 189 148* 148* 157     Basic Metabolic Panel: Recent Labs  Lab 02/13/21 2003 02/14/21 0311 02/15/21 0323 02/16/21 0357 02/17/21 0324  NA 140 141 143 140 138  K 4.8 4.3 4.3 3.9 4.0  CL 105 108 112* 111 106  CO2 25 27 24 25 26   GLUCOSE 181* 152* 105* 134* 237*  BUN 53* 54* 38* 25* 21  CREATININE 1.98* 1.88* 1.55* 1.37* 1.10*  CALCIUM 8.7* 8.1* 8.0* 8.1* 8.6*  MG 2.3  --  2.3  --  1.9  PHOS  --   --  3.0 2.5 2.2*     GFR: Estimated Creatinine Clearance: 54.7 mL/min (A) (by C-G formula  based on SCr of 1.1 mg/dL (H)).  Liver Function Tests: Recent Labs  Lab 02/15/21 0323 02/16/21 0357 02/17/21 0324  ALBUMIN 2.6* 2.7* 3.2*     CBG: Recent Labs  Lab 02/16/21 1147 02/16/21 1614 02/16/21 2113 02/17/21 0743 02/17/21 1157  GLUCAP 108* 227* 261* 196* 156*      Recent Results (from the past 240 hour(s))  Culture, blood (routine x 2)     Status: None (Preliminary result)   Collection Time: 02/13/21  8:03 PM   Specimen: BLOOD  Result Value Ref Range Status   Specimen Description   Final    BLOOD BLOOD LEFT HAND Performed at [facility], [address] [place], [place]    Special Requests   Final    BOTTLES DRAWN AEROBIC AND ANAEROBIC Blood Culture adequate volume Performed at [facility], [address], [place], [place]    Culture  Final    NO GROWTH 4 DAYS Performed at [facility], [address], [place], [place] [address]    Report Status PENDING  Incomplete  Culture, blood (routine x 2)     Status: None (Preliminary result)   Collection Time: 02/13/21  8:03 PM   Specimen: BLOOD  Result Value Ref Range Status   Specimen Description   Final    BLOOD RIGHT ANTECUBITAL Performed at [facility], [address], [place], [place] [address]    Special Requests   Final    BOTTLES DRAWN AEROBIC AND ANAEROBIC Blood Culture adequate volume Performed at [facility], [address], [place], [place] [address]    Culture   Final    NO GROWTH 4 DAYS Performed at [facility], [address], [place], [place] [address]    Report Status PENDING  Incomplete  Resp Panel by RT-PCR (Flu A&B, Covid) Nasopharyngeal Swab     Status: Abnormal   Collection Time: 02/13/21  9:49 PM   Specimen: Nasopharyngeal Swab; Nasopharyngeal(NP) swabs in vial transport medium  Result Value Ref Range Status   SARS Coronavirus 2 by RT PCR NEGATIVE NEGATIVE Final     Comment: (NOTE) SARS-CoV-2 target nucleic acids are NOT DETECTED.  The SARS-CoV-2 RNA is generally detectable in upper respiratory specimens during the acute phase of infection. The lowest concentration of SARS-CoV-2 viral copies this assay can detect is 138 copies/mL. A negative result does not preclude SARS-Cov-2 infection and should not be used as the sole basis for treatment or other patient management decisions. A negative result may occur with  improper specimen collection/handling, submission of specimen other than nasopharyngeal swab, presence of viral mutation(s) within the areas targeted by this assay, and inadequate number of viral copies(<138 copies/mL). A negative result must be combined with clinical observations, patient history, and epidemiological information. The expected result is Negative.  Fact Sheet for Patients:  BloggerCourse.com  Fact Sheet for Healthcare Providers:  SeriousBroker.it  This test is no t yet approved or cleared by the [place] FDA and  has been authorized for detection and/or diagnosis of SARS-CoV-2 by FDA under an Emergency Use Authorization (EUA). This EUA will remain  in effect (meaning this test can be used) for the duration of the COVID-19 declaration under Section 564(b)(1) of the Act, 21 U.S.C.section 360bbb-3(b)(1), unless the authorization is terminated  or revoked sooner.       Influenza A by PCR POSITIVE (A) NEGATIVE Final   Influenza B by PCR NEGATIVE NEGATIVE Final    Comment: (NOTE) The Xpert Xpress SARS-CoV-2/FLU/RSV plus assay is intended as an aid in the diagnosis of influenza from Nasopharyngeal swab specimens and should not be used as a sole basis for treatment. Nasal washings and aspirates are unacceptable for Xpert Xpress SARS-CoV-2/FLU/RSV testing.  Fact Sheet for Patients: BloggerCourse.com  Fact Sheet for Healthcare  Providers: SeriousBroker.it  This test is not yet approved or cleared by the [place] FDA and has been authorized for detection and/or diagnosis of SARS-CoV-2 by FDA under an Emergency Use Authorization (EUA). This EUA will remain in effect (meaning this test can be used) for the duration of the COVID-19 declaration under Section 564(b)(1) of the Act, 21 U.S.C. section 360bbb-3(b)(1), unless the authorization is terminated or revoked.  Performed at [facility], [address] [place], [place] [address]   Urine Culture     Status: Abnormal   Collection Time: 02/13/21 11:00 PM  Specimen: Urine, Clean Catch  Result Value Ref Range Status   Specimen Description   Final    URINE, CLEAN CATCH Performed at [facility], [address], [place], [place] [address]    Special Requests   Final    NONE Performed at [facility], [address] [place], [place] [address]    Culture (A)  Final    <10,000 COLONIES/mL INSIGNIFICANT GROWTH Performed at [facility], [address] [place], [place] [address]    Report Status 02/14/2021 FINAL  Final          Radiology Studies: No results found.      Scheduled Meds:  [START ON 02/18/2021] amLODipine  10 mg Oral Daily   atorvastatin  40 mg Oral Daily   carvedilol  25 mg Oral BID WC   colchicine  0.6 mg Oral Daily   ezetimibe  10 mg Oral Daily   fluticasone  2 spray Each Nare Daily   guaiFENesin  1,200 mg Oral BID   heparin  5,000 Units Subcutaneous Q8H   hydrALAZINE  50 mg Oral TID   hydrocortisone cream   Topical BID   insulin aspart  0-9 Units Subcutaneous TID WC   insulin glargine-yfgn  18 Units Subcutaneous QHS   ipratropium-albuterol  3 mL Nebulization TID   loratadine  10 mg Oral Daily   mometasone-formoterol  2 puff Inhalation BID   oseltamivir  30 mg Oral BID   pregabalin  75 mg Oral BID   traMADol  50 mg Oral TID    Continuous Infusions:     LOS: 3 days    Time spent: 35 minutes    Ramiro Harvest, MD [facility]   To contact the attending provider between 7A-7P or the covering provider during after hours 7P-7A, please log into the web site [email or web address] and access using universal [place] password for that web site. If you do not have the password, please call the hospital operator.  02/17/2021, 2:14 PM

## 2021-02-17 NOTE — Plan of Care (Signed)
  Problem: Clinical Measurements: Goal: Ability to maintain clinical measurements within normal limits will improve Outcome: Progressing   Problem: Clinical Measurements: Goal: Will remain free from infection Outcome: Progressing   Problem: Activity: Goal: Risk for activity intolerance will decrease Outcome: Progressing   Problem: Pain Managment: Goal: General experience of comfort will improve Outcome: Progressing   

## 2021-02-18 DIAGNOSIS — R531 Weakness: Secondary | ICD-10-CM | POA: Diagnosis not present

## 2021-02-18 DIAGNOSIS — E1169 Type 2 diabetes mellitus with other specified complication: Secondary | ICD-10-CM | POA: Diagnosis not present

## 2021-02-18 DIAGNOSIS — N179 Acute kidney failure, unspecified: Secondary | ICD-10-CM | POA: Diagnosis not present

## 2021-02-18 DIAGNOSIS — E1151 Type 2 diabetes mellitus with diabetic peripheral angiopathy without gangrene: Secondary | ICD-10-CM | POA: Diagnosis not present

## 2021-02-18 LAB — CBC WITH DIFFERENTIAL/PLATELET
Abs Immature Granulocytes: 0.06 10*3/uL (ref 0.00–0.07)
Basophils Absolute: 0 10*3/uL (ref 0.0–0.1)
Basophils Relative: 0 %
Eosinophils Absolute: 0 10*3/uL (ref 0.0–0.5)
Eosinophils Relative: 0 %
HCT: 32.2 % — ABNORMAL LOW (ref 36.0–46.0)
Hemoglobin: 10.3 g/dL — ABNORMAL LOW (ref 12.0–15.0)
Immature Granulocytes: 1 %
Lymphocytes Relative: 29 %
Lymphs Abs: 2.6 10*3/uL (ref 0.7–4.0)
MCH: 29.4 pg (ref 26.0–34.0)
MCHC: 32 g/dL (ref 30.0–36.0)
MCV: 92 fL (ref 80.0–100.0)
Monocytes Absolute: 0.6 10*3/uL (ref 0.1–1.0)
Monocytes Relative: 7 %
Neutro Abs: 5.7 10*3/uL (ref 1.7–7.7)
Neutrophils Relative %: 63 %
Platelets: 164 10*3/uL (ref 150–400)
RBC: 3.5 MIL/uL — ABNORMAL LOW (ref 3.87–5.11)
RDW: 13.3 % (ref 11.5–15.5)
WBC: 9 10*3/uL (ref 4.0–10.5)
nRBC: 0 % (ref 0.0–0.2)

## 2021-02-18 LAB — GLUCOSE, CAPILLARY
Glucose-Capillary: 198 mg/dL — ABNORMAL HIGH (ref 70–99)
Glucose-Capillary: 126 mg/dL — ABNORMAL HIGH (ref 70–99)
Glucose-Capillary: 118 mg/dL — ABNORMAL HIGH (ref 70–99)
Glucose-Capillary: 138 mg/dL — ABNORMAL HIGH (ref 70–99)
Glucose-Capillary: 157 mg/dL — ABNORMAL HIGH (ref 70–99)

## 2021-02-18 LAB — CULTURE, BLOOD (ROUTINE X 2)
Culture: NO GROWTH
Culture: NO GROWTH
Special Requests: ADEQUATE
Special Requests: ADEQUATE

## 2021-02-18 LAB — RENAL FUNCTION PANEL
Albumin: 2.9 g/dL — ABNORMAL LOW (ref 3.5–5.0)
Anion gap: 8 (ref 5–15)
BUN: 24 mg/dL — ABNORMAL HIGH (ref 8–23)
CO2: 26 mmol/L (ref 22–32)
Calcium: 8.4 mg/dL — ABNORMAL LOW (ref 8.9–10.3)
Chloride: 107 mmol/L (ref 98–111)
Creatinine, Ser: 1.22 mg/dL — ABNORMAL HIGH (ref 0.44–1.00)
GFR, Estimated: 50 mL/min — ABNORMAL LOW (ref >60)
Glucose, Bld: 171 mg/dL — ABNORMAL HIGH (ref 70–99)
Phosphorus: 2.5 mg/dL (ref 2.5–4.6)
Potassium: 3.6 mmol/L (ref 3.5–5.1)
Sodium: 141 mmol/L (ref 135–145)

## 2021-02-18 LAB — MAGNESIUM: Magnesium: 1.8 mg/dL (ref 1.7–2.4)

## 2021-02-18 MED ORDER — SODIUM CHLORIDE 0.9 % IV BOLUS
500.0000 mL | Freq: Once | INTRAVENOUS | Status: AC
  Administered 2021-02-18: 500 mL via INTRAVENOUS

## 2021-02-18 MED ORDER — MAGNESIUM SULFATE 2 GM/50ML IV SOLN
2.0000 g | Freq: Once | INTRAVENOUS | Status: AC
  Administered 2021-02-18: 2 g via INTRAVENOUS
  Filled 2021-02-18: qty 50

## 2021-02-18 MED ORDER — TRAMADOL HCL 50 MG PO TABS
50.0000 mg | ORAL_TABLET | Freq: Three times a day (TID) | ORAL | Status: DC
  Administered 2021-02-18 – 2021-02-20 (×5): 50 mg via ORAL
  Filled 2021-02-18 (×5): qty 1

## 2021-02-18 MED ORDER — IPRATROPIUM-ALBUTEROL 0.5-2.5 (3) MG/3ML IN SOLN
3.0000 mL | Freq: Two times a day (BID) | RESPIRATORY_TRACT | Status: DC
  Administered 2021-02-18 – 2021-02-20 (×4): 3 mL via RESPIRATORY_TRACT
  Filled 2021-02-18 (×4): qty 3

## 2021-02-18 NOTE — Progress Notes (Signed)
PROGRESS NOTE    Shirley Chen  XBJ:[id number] DOB: [birth year] DOA: 02/13/2021 PCP: Olive Bass, FNP    Chief Complaint  Patient presents with   Weakness    Brief Narrative:  Patient is a pleasant 65 year old female history of HFpEF EF 60 to 65%, G2 DD by TTE 08/04/2020, insulin-dependent type 2 diabetes, hypertension, hyperlipidemia, peripheral vascular disease with history of necrotizing abscess status post left BKA, chronic back pain admitted with generalized weakness and acute kidney injury.  Noted to be positive for influenza A.  Patient placed on Tamiflu, IV fluids, pain management for chronic pain and sciatica.   Assessment & Plan:   Principal Problem:   Acute kidney injury (HCC) Active Problems:   Influenza A   PAD (peripheral artery disease) (HCC)   Hypertension associated with diabetes (HCC)   Diabetes mellitus with peripheral vascular disease (HCC)   Hyperlipidemia associated with type 2 diabetes mellitus (HCC)   Acute renal failure (HCC)   Weakness   AKI (acute kidney injury) (HCC)   1 acute kidney injury -Likely secondary to prerenal azotemia in the setting of diuretics of spironolactone and Lasix. -Patient on admission noted to be clinically dry on examination but has improved. -Urinalysis with 30 of protein, nitrite negative leukocytes negative.  Urine sodium of 39, urine creatinine of 114.35. -Creatinine is slowly trending down currently at 1.22 from 1.10 from 1.37 from 1.55 from 1.88 from 1.98 on admission. -Last creatinine noted at 0.9 on 10/01/2020 -Renal ultrasound with borderline right hydronephrosis and possible trace perinephritic fluid.  Incomplete evaluation of the left kidney. -Urine output 1.050 L over the past 24 hours . -Saline lock IV fluids.   -Continue to hold Lasix, spironolactone. -Avoid nephrotoxic agents.    2.  Influenza A -Likely etiology of patient's generalized weakness. -Patient with complaints of congestion, noted to  have minimal expiratory wheezing on examination which has improved over the past 24 hours.. -COVID-19 PCR negative. -Continue Tamiflu to complete a 5-day course.   -Continue Mucinex, Claritin, Flonase, bronchodilators.  3.  Dehydration -Saline lock IV fluids.   4.  Elevated troponin -Troponin minimally elevated and flattened likely in the setting of AKI. -Patient with no chest pain, EKG with no ischemic changes. -No further cardiac work-up needed at this time.  5.  Peripheral vascular disease status post left BKA -Zetia, statin.  6.  Insulin-dependent type 2 diabetes mellitus -Hemoglobin A1c 8.0 (11/102022). -CBG 126 this morning . -Semglee, SSI.  7.  Hypertension -Blood pressure noted to be borderline on admission however since admission has improved.   -Continue Coreg, Norvasc, hydralazine.   -Continue to hold spironolactone and Lasix.    8.  Hyperlipidemia -Continue Zetia, statin.  9.  Chronic back pain/sciatica -Improved on scheduled Ultram and oxycodone as needed. -Continue Lyrica. -Supportive care  10.  Generalized weakness Likely secondary to problem #2. -PT/OT assessed patient and recommending SNF. -TOC consulted for SNF placement.  11.  HFpEF -2D echo from 08/04/2020 with EF of 60 to 65%, G2 DD.  Patient more on the dry side. -Saline lock IV fluids.   -Continue to hold Lasix, spironolactone.     DVT prophylaxis: Heparin Code Status: DNR Family Communication: Updated patient.  No family at bedside. Disposition:   Status is: Inpatient, due to severity of illness        Consultants:  None  Procedures:  Renal ultrasound 02/14/2021 Chest x-ray 02/13/2021   Antimicrobials:  Tamiflu 02/14/2021>>>>>>   Subjective: Laying in bed.  Overall feeling better.  No chest pain.  No shortness of breath.  Sciatic pain still controlled and improved.  Tolerating diet.  Objective: Vitals:   02/17/21 2235 02/18/21 0611 02/18/21 0922 02/18/21 0943  BP: (!)  143/62 (!) 126/57  136/60  Pulse: 63 62  61  Resp: 16 14  15   Temp: 98.1 F (36.7 C) 98.2 F (36.8 C)  97.9 F (36.6 C)  TempSrc: Oral Oral  Oral  SpO2: 95% 95% 95% 93%  Weight:      Height:        Intake/Output Summary (Last 24 hours) at 02/18/2021 1206 Last data filed at 02/18/2021 1104 Gross per 24 hour  Intake 1261 ml  Output 750 ml  Net 511 ml    Filed Weights   02/13/21 2359  Weight: 85.7 kg    Examination:  General exam: NAD Respiratory system: CTA B.  No wheezes, no crackles, no rhonchi.  Normal respiratory effort.  Speaking in full sentences.  Cardiovascular system: RRR no murmurs rubs or gallops.  No JVD.  No lower extremity edema.  Gastrointestinal system: Abdomen is soft, nontender, nondistended, positive bowel sounds.  No rebound.  No guarding. Central nervous system: Alert and oriented.  No focal neurological deficits.  Moving extremities spontaneously.   Extremities: Status post left BKA. Skin: No rashes, lesions or ulcers Psychiatry: Judgement and insight appear normal. Mood & affect appropriate.     Data Reviewed: I have personally reviewed following labs and imaging studies  CBC: Recent Labs  Lab 02/13/21 2003 02/14/21 0311 02/15/21 0323 02/16/21 0357 02/17/21 0324 02/18/21 0325  WBC 6.9 5.8 4.7 4.3 2.8* 9.0  NEUTROABS 5.2  --   --  1.8  --  5.7  HGB 12.0 10.3* 10.1* 10.3* 11.7* 10.3*  HCT 37.3 32.9* 33.3* 32.5* 36.6 32.2*  MCV 93.0 94.3 96.2 95.0 91.7 92.0  PLT 235 189 148* 148* 157 164     Basic Metabolic Panel: Recent Labs  Lab 02/13/21 2003 02/14/21 0311 02/15/21 0323 02/16/21 0357 02/17/21 0324 02/18/21 0325  NA 140 141 143 140 138 141  K 4.8 4.3 4.3 3.9 4.0 3.6  CL 105 108 112* 111 106 107  CO2 25 27 24 25 26 26   GLUCOSE 181* 152* 105* 134* 237* 171*  BUN 53* 54* 38* 25* 21 24*  CREATININE 1.98* 1.88* 1.55* 1.37* 1.10* 1.22*  CALCIUM 8.7* 8.1* 8.0* 8.1* 8.6* 8.4*  MG 2.3  --  2.3  --  1.9 1.8  PHOS  --   --  3.0 2.5  2.2* 2.5     GFR: Estimated Creatinine Clearance: 49.3 mL/min (A) (by C-G formula based on SCr of 1.22 mg/dL (H)).  Liver Function Tests: Recent Labs  Lab 02/15/21 0323 02/16/21 0357 02/17/21 0324 02/18/21 0325  ALBUMIN 2.6* 2.7* 3.2* 2.9*     CBG: Recent Labs  Lab 02/17/21 0743 02/17/21 1157 02/17/21 1705 02/17/21 2232 02/18/21 0745  GLUCAP 196* 156* 158* 198* 126*      Recent Results (from the past 240 hour(s))  Culture, blood (routine x 2)     Status: None   Collection Time: 02/13/21  8:03 PM   Specimen: BLOOD  Result Value Ref Range Status   Specimen Description   Final    BLOOD BLOOD LEFT HAND Performed at [facility], [address], [place], [place] [address]    Special Requests   Final    BOTTLES DRAWN AEROBIC AND ANAEROBIC Blood Culture adequate volume Performed at [facility],  [address] [place], [place] [address]    Culture   Final    NO GROWTH 5 DAYS Performed at [facility], [address], [place], [place] [address]    Report Status 02/18/2021 FINAL  Final  Culture, blood (routine x 2)     Status: None   Collection Time: 02/13/21  8:03 PM   Specimen: BLOOD  Result Value Ref Range Status   Specimen Description   Final    BLOOD RIGHT ANTECUBITAL Performed at [facility], [address] [place], [place] [address]    Special Requests   Final    BOTTLES DRAWN AEROBIC AND ANAEROBIC Blood Culture adequate volume Performed at [facility], [address], [place], [place] [address]    Culture   Final    NO GROWTH 5 DAYS Performed at [facility], [address] [place], [place] [address]    Report Status 02/18/2021 FINAL  Final  Resp Panel by RT-PCR (Flu A&B, Covid) Nasopharyngeal Swab     Status: Abnormal   Collection Time: 02/13/21  9:49 PM   Specimen: Nasopharyngeal Swab; Nasopharyngeal(NP) swabs in vial transport medium  Result Value Ref  Range Status   SARS Coronavirus 2 by RT PCR NEGATIVE NEGATIVE Final    Comment: (NOTE) SARS-CoV-2 target nucleic acids are NOT DETECTED.  The SARS-CoV-2 RNA is generally detectable in upper respiratory specimens during the acute phase of infection. The lowest concentration of SARS-CoV-2 viral copies this assay can detect is 138 copies/mL. A negative result does not preclude SARS-Cov-2 infection and should not be used as the sole basis for treatment or other patient management decisions. A negative result may occur with  improper specimen collection/handling, submission of specimen other than nasopharyngeal swab, presence of viral mutation(s) within the areas targeted by this assay, and inadequate number of viral copies(<138 copies/mL). A negative result must be combined with clinical observations, patient history, and epidemiological information. The expected result is Negative.  Fact Sheet for Patients:  BloggerCourse.com  Fact Sheet for Healthcare Providers:  SeriousBroker.it  This test is no t yet approved or cleared by the [place] FDA and  has been authorized for detection and/or diagnosis of SARS-CoV-2 by FDA under an Emergency Use Authorization (EUA). This EUA will remain  in effect (meaning this test can be used) for the duration of the COVID-19 declaration under Section 564(b)(1) of the Act, 21 U.S.C.section 360bbb-3(b)(1), unless the authorization is terminated  or revoked sooner.       Influenza A by PCR POSITIVE (A) NEGATIVE Final   Influenza B by PCR NEGATIVE NEGATIVE Final    Comment: (NOTE) The Xpert Xpress SARS-CoV-2/FLU/RSV plus assay is intended as an aid in the diagnosis of influenza from Nasopharyngeal swab specimens and should not be used as a sole basis for treatment. Nasal washings and aspirates are unacceptable for Xpert Xpress SARS-CoV-2/FLU/RSV testing.  Fact Sheet for  Patients: BloggerCourse.com  Fact Sheet for Healthcare Providers: SeriousBroker.it  This test is not yet approved or cleared by the [place] FDA and has been authorized for detection and/or diagnosis of SARS-CoV-2 by FDA under an Emergency Use Authorization (EUA). This EUA will remain in effect (meaning this test can be used) for the duration of the COVID-19 declaration under Section 564(b)(1) of the Act, 21 U.S.C. section 360bbb-3(b)(1), unless the authorization is terminated or revoked.  Performed at [facility], [address] [place], [place] [address]   Urine Culture  Status: Abnormal   Collection Time: 02/13/21 11:00 PM   Specimen: Urine, Clean Catch  Result Value Ref Range Status   Specimen Description   Final    URINE, CLEAN CATCH Performed at [facility], [address], [place], [place] [address]    Special Requests   Final    NONE Performed at [facility], [address], [place], [place] [address]    Culture (A)  Final    <10,000 COLONIES/mL INSIGNIFICANT GROWTH Performed at [facility], [address] [place], [place] [address]    Report Status 02/14/2021 FINAL  Final          Radiology Studies: No results found.      Scheduled Meds:  amLODipine  10 mg Oral Daily   atorvastatin  40 mg Oral Daily   carvedilol  25 mg Oral BID WC   colchicine  0.6 mg Oral Daily   ezetimibe  10 mg Oral Daily   fluticasone  2 spray Each Nare Daily   guaiFENesin  1,200 mg Oral BID   heparin  5,000 Units Subcutaneous Q8H   hydrALAZINE  50 mg Oral TID   hydrocortisone cream   Topical BID   insulin aspart  0-9 Units Subcutaneous TID WC   insulin glargine-yfgn  18 Units Subcutaneous QHS   ipratropium-albuterol  3 mL Nebulization BID   loratadine  10 mg Oral Daily   mometasone-formoterol  2 puff Inhalation BID   oseltamivir  30 mg Oral BID    potassium & sodium phosphates  1 packet Oral TID WC & HS   pregabalin  75 mg Oral BID   traMADol  50 mg Oral TID   Continuous Infusions:     LOS: 4 days    Time spent: 35 minutes    Ramiro Harvest, MD [facility]   To contact the attending provider between 7A-7P or the covering provider during after hours 7P-7A, please log into the web site [email or web address] and access using universal [place] password for that web site. If you do not have the password, please call the hospital operator.  02/18/2021, 12:06 PM

## 2021-02-18 NOTE — Plan of Care (Signed)
  Problem: Education: Goal: Knowledge of General Education information will improve Description: Including pain rating scale, medication(s)/side effects and non-pharmacologic comfort measures Outcome: Progressing   Problem: Pain Managment: Goal: General experience of comfort will improve Outcome: Progressing   Problem: Safety: Goal: Ability to remain free from injury will improve Outcome: Progressing   

## 2021-02-18 NOTE — Plan of Care (Signed)

## 2021-02-19 ENCOUNTER — Encounter: Payer: Medicare Other | Admitting: Physician Assistant

## 2021-02-19 DIAGNOSIS — E1159 Type 2 diabetes mellitus with other circulatory complications: Secondary | ICD-10-CM | POA: Diagnosis not present

## 2021-02-19 DIAGNOSIS — E1151 Type 2 diabetes mellitus with diabetic peripheral angiopathy without gangrene: Secondary | ICD-10-CM | POA: Diagnosis not present

## 2021-02-19 DIAGNOSIS — E1169 Type 2 diabetes mellitus with other specified complication: Secondary | ICD-10-CM | POA: Diagnosis not present

## 2021-02-19 DIAGNOSIS — Z794 Long term (current) use of insulin: Secondary | ICD-10-CM

## 2021-02-19 DIAGNOSIS — N179 Acute kidney failure, unspecified: Secondary | ICD-10-CM | POA: Diagnosis not present

## 2021-02-19 LAB — BASIC METABOLIC PANEL
Anion gap: 6 (ref 5–15)
BUN: 21 mg/dL (ref 8–23)
CO2: 23 mmol/L (ref 22–32)
Calcium: 8.1 mg/dL — ABNORMAL LOW (ref 8.9–10.3)
Chloride: 110 mmol/L (ref 98–111)
Creatinine, Ser: 1.15 mg/dL — ABNORMAL HIGH (ref 0.44–1.00)
GFR, Estimated: 53 mL/min — ABNORMAL LOW (ref >60)
Glucose, Bld: 106 mg/dL — ABNORMAL HIGH (ref 70–99)
Potassium: 4.4 mmol/L (ref 3.5–5.1)
Sodium: 139 mmol/L (ref 135–145)

## 2021-02-19 LAB — RESP PANEL BY RT-PCR (FLU A&B, COVID) ARPGX2
Influenza A by PCR: NEGATIVE
Influenza B by PCR: NEGATIVE
SARS Coronavirus 2 by RT PCR: NEGATIVE

## 2021-02-19 LAB — GLUCOSE, CAPILLARY
Glucose-Capillary: 79 mg/dL (ref 70–99)
Glucose-Capillary: 127 mg/dL — ABNORMAL HIGH (ref 70–99)
Glucose-Capillary: 193 mg/dL — ABNORMAL HIGH (ref 70–99)
Glucose-Capillary: 180 mg/dL — ABNORMAL HIGH (ref 70–99)

## 2021-02-19 LAB — MAGNESIUM: Magnesium: 2.2 mg/dL (ref 1.7–2.4)

## 2021-02-19 MED ORDER — TRAMADOL HCL 50 MG PO TABS: 50.0000 mg | ORAL_TABLET | Freq: Three times a day (TID) | ORAL | 0 refills | Status: AC

## 2021-02-19 MED ORDER — FUROSEMIDE 40 MG PO TABS: 40.0000 mg | ORAL_TABLET | Freq: Every day | ORAL | Status: DC

## 2021-02-19 MED ORDER — LORATADINE 10 MG PO TABS: 10.0000 mg | ORAL_TABLET | Freq: Every day | ORAL | Status: DC

## 2021-02-19 MED ORDER — OXYCODONE-ACETAMINOPHEN 7.5-325 MG PO TABS: 1.0000 | ORAL_TABLET | Freq: Four times a day (QID) | ORAL | 0 refills | Status: DC | PRN

## 2021-02-19 MED ORDER — POTASSIUM & SODIUM PHOSPHATES 280-160-250 MG PO PACK: 1.0000 | PACK | Freq: Three times a day (TID) | ORAL | 0 refills | Status: AC

## 2021-02-19 MED ORDER — SENNOSIDES-DOCUSATE SODIUM 8.6-50 MG PO TABS: 1.0000 | ORAL_TABLET | Freq: Two times a day (BID) | ORAL | Status: DC | PRN

## 2021-02-19 MED ORDER — BISACODYL 10 MG RE SUPP
10.0000 mg | Freq: Once | RECTAL | Status: AC
  Administered 2021-02-19: 10 mg via RECTAL
  Filled 2021-02-19: qty 1

## 2021-02-19 MED ORDER — PROCHLORPERAZINE EDISYLATE 10 MG/2ML IJ SOLN: 10.0000 mg | Freq: Four times a day (QID) | INTRAMUSCULAR | Status: DC | PRN

## 2021-02-19 MED ORDER — POTASSIUM CHLORIDE ER 20 MEQ PO TBCR: 1.0000 | EXTENDED_RELEASE_TABLET | Freq: Every day | ORAL | Status: DC

## 2021-02-19 MED ORDER — MOMETASONE FURO-FORMOTEROL FUM 200-5 MCG/ACT IN AERO
2.0000 | INHALATION_SPRAY | Freq: Two times a day (BID) | RESPIRATORY_TRACT | 0 refills | Status: DC
Start: 2021-02-19 — End: 2021-03-27

## 2021-02-19 MED ORDER — HYDROCORTISONE 0.5 % EX CREA: TOPICAL_CREAM | Freq: Two times a day (BID) | CUTANEOUS | 0 refills | Status: AC

## 2021-02-19 MED ORDER — SPIRONOLACTONE 25 MG PO TABS: 25.0000 mg | ORAL_TABLET | Freq: Every day | ORAL | Status: DC

## 2021-02-19 MED ORDER — GUAIFENESIN ER 600 MG PO TB12: 1200.0000 mg | ORAL_TABLET | Freq: Two times a day (BID) | ORAL | 0 refills | Status: AC

## 2021-02-19 MED ORDER — ALBUTEROL SULFATE HFA 108 (90 BASE) MCG/ACT IN AERS: 2.0000 | INHALATION_SPRAY | Freq: Four times a day (QID) | RESPIRATORY_TRACT | 0 refills | Status: DC | PRN

## 2021-02-19 MED ORDER — FLUTICASONE PROPIONATE 50 MCG/ACT NA SUSP: 2.0000 | Freq: Every day | NASAL | 2 refills | Status: DC

## 2021-02-19 MED ORDER — INSULIN GLARGINE 100 UNIT/ML ~~LOC~~ SOLN
20.0000 [IU] | Freq: Every day | SUBCUTANEOUS | 0 refills | Status: DC
Start: 2021-02-19 — End: 2021-06-13

## 2021-02-19 MED ORDER — SORBITOL 70 % SOLN
30.0000 mL | Status: AC
  Administered 2021-02-19: 30 mL via ORAL
  Filled 2021-02-19 (×2): qty 30

## 2021-02-19 NOTE — Plan of Care (Signed)

## 2021-02-19 NOTE — TOC Progression Note (Signed)
Transition of Care [facility]) - Progression Note   Patient Details  Name: Shirley Chen MRN: [id number] Date of Birth: 09/29/1955  Transition of [facility]) CM/SW Contact  Ewing Schlein, LCSW Phone Number: 02/19/2021, 2:29 PM  Clinical Narrative: Patient completed the course of Tamiflu yesterday. CSW followed up with Malena Peer at [facility] to discuss admission today, but Malena Peer declined to accept the patient today as she has a cough. Patient's COVID and flu results from this morning came back negative. Patient's lungs are clear and cough is non-productive.  CSW reached out to Vera Cruz with [facility] regarding the delay with the expected admission date. CSW received return call from [facility] stating Lacinda Axon will accept the patient in the morning. CSW updated the hospitalist, RN, and patient. TOC to follow.  Expected Discharge Plan: Long Term Nursing Home Barriers to Discharge: SNF Pending bed offer  Expected Discharge Plan and Services Expected Discharge Plan: Long Term Nursing Home In-house Referral: Clinical Social Work Living arrangements for the past 2 months: Single Family Home Expected Discharge Date: 02/19/21               DME Arranged: N/A DME Agency: NA  Readmission Risk Interventions No flowsheet data found.

## 2021-02-19 NOTE — Discharge Summary (Signed)
Physician Discharge Summary  Shirley Chen YNW:[id number] DOB: 07-31-1955 DOA: 02/13/2021  PCP: Marrian Salvage, FNP  Admit date: 02/13/2021 Discharge date: 02/19/2021  Time spent: 50 minutes  Recommendations for Outpatient Follow-up:  Follow-up with MD at skilled nursing facility.  Patient will need a basic metabolic profile, magnesium level checked in 1 week to follow-up on electrolytes and renal function.   Discharge Diagnoses:  Principal Problem:   Acute kidney injury ([place]) Active Problems:   Influenza A   PAD (peripheral artery disease) ([place])   Hypertension associated with diabetes ([place])   Diabetes mellitus with peripheral vascular disease ([place])   Hyperlipidemia associated with type 2 diabetes mellitus ([place])   Acute renal failure ([place])   Weakness   AKI (acute kidney injury) ([place])   Discharge Condition: Stable and improved.  Diet recommendation: Carb modified diet  Filed Weights   02/13/21 2359  Weight: 85.7 kg    History of present illness:  HPI per Dr. Parks Ranger is a 65 y.o. female with medical history significant for HFpEF (EF 60-65%, G2 DD by TTE 08/04/2020), insulin-dependent T2DM, HTN, HLD, PVD w/ hx of necrotizing abscess s/p left BKA, chronic back pain who presented to the ED for evaluation of weakness.  Patient last admitted 09/25/2020-10/04/2020 for sepsis due to ESBL E. coli and COVID-19 infection.  She was treated with meropenem, remdesivir.  Also noted to have AKI which improved with IV fluid hydration.  She was discharged to SNF.  Patient returned to home to live with her son 1 week ago.  She says that she is mostly using a wheelchair but can stand and transfer when she has her left lower extremity prosthesis in place.  She says today she has been feeling very fatigued/tired and generally weak.  She went to the bathroom and was unable to get up off of the toilet.  She did not have her prosthesis in place.  She says she remained on the toilet  for 10 hours until her son returned home from work and found her.  EMS were called and patient was brought to the ED for further evaluation.  Patient reports good urine output when she takes Lasix.  She denies any nausea, vomiting, abdominal pain.  She thinks she had a loose bowel movement earlier today.  She has had felt some lightheadedness.  She denies any chest pain or dyspnea.  She reports chronic swelling to her right lower extremity, she is not sure if this has changed any from her baseline.   ED Course:  Initial vitals showed BP 144/56, pulse 86, RR 12, temp 98.8 F, SPO2 94% on room air.   Labs show WBC 6.9, hemoglobin 12.0, platelets urine 35,000, BUN 53, creatinine 1.98 (baseline 0.90-1.0), sodium 140, potassium 4.8, bicarb 25, serum glucose 181, high-sensitivity troponin 23, lactic acid 1.5, magnesium 2.3.   Blood cultures collected and pending.  Urinalysis and urine culture ordered and pending collection.  Respiratory panel ordered and pending collection.   Portable chest x-ray is negative for focal consolidation, edema, effusion.   Patient was given 1 L normal saline and oral Percocet.  The hospitalist service was consulted to admit for further evaluation and management.  Hospital Course:  1 acute kidney injury -Likely secondary to prerenal azotemia in the setting of diuretics of spironolactone and Lasix. -Patient on admission noted to be clinically dry on examination but has improved. -Urinalysis with 30 of protein, nitrite negative leukocytes negative.  Urine sodium of 39, urine creatinine of 114.35. -Creatinine  improved during the hospitalization and was down to 1.15 from 1.98 on admission.  -Last creatinine noted at 0.9 on 10/01/2020 -Patient with good urine output during the hospitalization. -Renal ultrasound with borderline right hydronephrosis and possible trace perinephritic fluid.  Incomplete evaluation of the left kidney. -Initially placed on IV fluids which were  subsequently discontinued. -Patient's Lasix and spironolactone were held and will be resumed 1 week post discharge. -Outpatient follow-up.   2.  Influenza A -Likely etiology of patient's generalized weakness. -Patient with complaints of congestion, noted to have minimal expiratory wheezing on examination which has improved over the past 24 hours.. -COVID-19 PCR negative. -Status post 5 days Tamiflu.   -Patient also maintained on Mucinex, Claritin, Flonase and bronchodilators.   -Patient be discharged in stable and improved condition.    3.  Dehydration -Hydrated with IV fluids  4.  Elevated troponin -Troponin minimally elevated and flattened likely in the setting of AKI. -Patient with no chest pain, EKG with no ischemic changes. -No further cardiac work-up needed at this time.  5.  Peripheral vascular disease status post left BKA -Patient intent on home regimen Zetia, statin.  6.  Insulin-dependent type 2 diabetes mellitus -Hemoglobin A1c 8.0 (11/102022). -Patient placed on Semglee at a decreased dose as well as SSI.   7.  Hypertension -Blood pressure noted to be borderline on admission however since admission has improved.   -Patient maintained on home regimen Coreg, Norvasc, hydralazine.  -Spironolactone, Lasix held during the hospitalization and will be resumed 1 week post discharge.    8.  Hyperlipidemia -Patient maintained on statin, Zetia.    9.  Chronic back pain/sciatica -Improved on scheduled Ultram and oxycodone as needed. -Patient maintained on home regimen Lyrica. -Patient will be discharged on 3 more days of scheduled Ultram and home regimen Percocet.  10.  Generalized weakness Likely secondary to problem #2. -PT/OT assessed patient and recommended SNF. -Patient to be discharged to SNF  11.  HFpEF -2D echo from 08/04/2020 with EF of 60 to 65%, G2 DD.  -Was on IV fluids which have been saline locked.  -Lasix spironolactone were held during the hospitalization  will be resumed 1 week post discharge.       Procedures: Renal ultrasound 02/14/2021 Chest x-ray 02/13/2021    Consultations: None  Discharge Exam: Vitals:   02/19/21 0810 02/19/21 0926  BP: (!) 163/63   Pulse: 62   Resp: 17   Temp: 98.2 F (36.8 C)   SpO2: 95% 96%    General: NAD Cardiovascular: RRR Respiratory: CTAB  Discharge Instructions   Discharge Instructions     Diet Carb Modified   Complete by: As directed    Increase activity slowly   Complete by: As directed       Allergies as of 02/19/2021       Reactions   Losartan Nausea And Vomiting   Morphine And Related    Pt states " I just got really sick"        Medication List     TAKE these medications    Accu-Chek Guide test strip Generic drug: glucose blood USE 1 STRIP TO CHECK BLOOD  GLUCOSE 3 TIMES DAILY   albuterol 108 (90 Base) MCG/ACT inhaler Commonly known as: VENTOLIN HFA Inhale 2 puffs into the lungs every 6 (six) hours as needed for wheezing or shortness of breath.   amLODipine 10 MG tablet Commonly known as: NORVASC TAKE 1 TABLET BY MOUTH  DAILY   atorvastatin 40 MG tablet Commonly  known as: LIPITOR TAKE 1 TABLET BY MOUTH  DAILY   blood glucose meter kit and supplies Kit Dispense based on patient and insurance preference. Use up to four times daily as directed. (FOR ICD-9 250.00, 250.01).   calcium carbonate (dosed in mg elemental calcium) 1250 MG/5ML Susp Take 5 mLs (500 mg of elemental calcium total) by mouth every 6 (six) hours as needed for indigestion.   carvedilol 25 MG tablet Commonly known as: COREG TAKE 1 TABLET BY MOUTH  TWICE DAILY WITH A MEAL   Cholecalciferol 25 MCG (1000 UT) Chew Chew 1,000 Units by mouth daily.   colchicine 0.6 MG tablet Take 1 tablet (0.6 mg total) by mouth daily.   ezetimibe 10 MG tablet Commonly known as: ZETIA TAKE 1 TABLET BY MOUTH  DAILY   feeding supplement (GLUCERNA SHAKE) Liqd Take 237 mLs by mouth 2 (two) times daily  between meals.   fluticasone 50 MCG/ACT nasal spray Commonly known as: FLONASE Place 2 sprays into both nostrils daily for 7 days. Start taking on: February 20, 2021   furosemide 40 MG tablet Commonly known as: LASIX Take 1 tablet (40 mg total) by mouth daily. Start taking on: February 26, 2021 What changed: These instructions start on February 26, 2021. If you are unsure what to do until then, ask your doctor or other care provider.   gabapentin 300 MG capsule Commonly known as: NEURONTIN Take 1 capsule (300 mg total) by mouth 3 (three) times daily.   glucose monitoring kit monitoring kit 1 each by Does not apply route 4 (four) times daily - after meals and at bedtime. 1 month Diabetic Testing Supplies for QAC-QHS accuchecks. Any brand OK. Diagnosis E11.65   guaiFENesin 600 MG 12 hr tablet Commonly known as: MUCINEX Take 2 tablets (1,200 mg total) by mouth 2 (two) times daily for 5 days.   hydrALAZINE 50 MG tablet Commonly known as: APRESOLINE Take 1 tablet (50 mg total) by mouth 3 (three) times daily.   hydrocortisone cream 0.5 % Apply topically 2 (two) times daily for 3 days. Apply to lateral aspect of left stump, right LE after cleansing, twice daily. No dressing/leave open to air.   insulin aspart 100 UNIT/ML injection Commonly known as: novoLOG 0-15 Units, Subcutaneous, 3 times daily with meals CBG < 70: Implement Hypoglycemia measures CBG 70 - 120: 0 units CBG 121 - 150: 2 units CBG 151 - 200: 3 units CBG 201 - 250: 5 units CBG 251 - 300: 8 units CBG 301 - 350: 11 units CBG 351 - 400: 15 units CBG > 400: call MD What changed:  how much to take how to take this when to take this   insulin glargine 100 UNIT/ML injection Commonly known as: LANTUS Inject 0.2 mLs (20 Units total) into the skin at bedtime. What changed: how much to take   loratadine 10 MG tablet Commonly known as: CLARITIN Take 1 tablet (10 mg total) by mouth daily. Start taking on: February 20, 2021   mometasone-formoterol 200-5 MCG/ACT Aero Commonly known as: DULERA Inhale 2 puffs into the lungs 2 (two) times daily.   ondansetron 4 MG tablet Commonly known as: ZOFRAN Take 4 mg by mouth every 6 (six) hours as needed for nausea or vomiting.   oxyCODONE-acetaminophen 7.5-325 MG tablet Commonly known as: PERCOCET Take 1 tablet by mouth every 6 (six) hours as needed for moderate pain.   potassium & sodium phosphates 280-160-250 MG Pack Commonly known as: PHOS-NAK Take 1 packet by  mouth 4 (four) times daily -  with meals and at bedtime for 2 days.   Potassium Chloride ER 20 MEQ Tbcr Take 1 tablet by mouth daily. Start taking on: February 26, 2021 What changed: These instructions start on February 26, 2021. If you are unsure what to do until then, ask your doctor or other care provider.   pregabalin 75 MG capsule Commonly known as: LYRICA Take 75 mg by mouth 2 (two) times daily.   senna-docusate 8.6-50 MG tablet Commonly known as: Senokot-S Take 1 tablet by mouth 2 (two) times daily as needed for mild constipation.   spironolactone 25 MG tablet Commonly known as: ALDACTONE Take 1 tablet (25 mg total) by mouth daily. Start taking on: February 26, 2021 What changed: These instructions start on February 26, 2021. If you are unsure what to do until then, ask your doctor or other care provider.   traMADol 50 MG tablet Commonly known as: ULTRAM Take 1 tablet (50 mg total) by mouth 3 (three) times daily for 3 days.       Allergies  Allergen Reactions   Losartan Nausea And Vomiting   Morphine And Related     Pt states " I just got really sick"    Contact information for follow-up providers     MD at snf Follow up.               Contact information for after-discharge care     Destination     [facility] .   Service: Skilled Nursing Contact information: [address] [place] [phone]                       The results of significant diagnostics from this hospitalization (including imaging, microbiology, ancillary and laboratory) are listed below for reference.    Significant Diagnostic Studies: US RENAL  Result Date: 02/14/2021 CLINICAL DATA:  Acute renal failure. EXAM: RENAL / URINARY TRACT ULTRASOUND COMPLETE COMPARISON:  Renal ultrasound 08/03/2020 FINDINGS: Right Kidney: Renal measurements: 11.1 x 5.5 x 5.4 cm = volume: 173 mL. Echogenicity within normal limits. Borderline hydronephrosis. No mass. Possible trace perinephric fluid. Left Kidney: Renal measurements: 10.0 x 4.8 cm. The patient terminated the examination after only limited images of the left kidney were obtained, precluding adequate evaluation for mass or hydronephrosis. Bladder: Appears normal for degree of bladder distention. Other: None. IMPRESSION: 1. Borderline right hydronephrosis and possible trace perinephric fluid. 2. Incomplete evaluation of the left kidney. Electronically Signed   By: Logan Bores M.D.   On: 02/14/2021 13:25   DG Chest Port 1 View  Result Date: 02/13/2021 CLINICAL DATA:  Increased weakness EXAM: PORTABLE CHEST 1 VIEW COMPARISON:  Chest x-ray 08/05/2020 FINDINGS: The heart and mediastinal contours are within normal limits. Aortic calcification. Similar-appearing hilar calcification likely sequelae of prior granulomatous disease. No focal consolidation. No pulmonary edema. No pleural effusion. No pneumothorax. No acute osseous abnormality. IMPRESSION: No active disease. Electronically Signed   By: Iven Finn M.D.   On: 02/13/2021 20:20    Microbiology: Recent Results (from the past 240 hour(s))  Culture, blood (routine x 2)     Status: None   Collection Time: 02/13/21  8:03 PM   Specimen: BLOOD  Result Value Ref Range Status   Specimen Description   Final    BLOOD BLOOD LEFT HAND Performed at [facility] [address] [place], [place] [address]    Special Requests    Final  BOTTLES DRAWN AEROBIC AND ANAEROBIC Blood Culture adequate volume Performed at [facility] [address], [place], [place] [address]    Culture   Final    NO GROWTH 5 DAYS Performed at [facility], [place] [address] [place], [place] [address]    Report Status 02/18/2021 FINAL  Final  Culture, blood (routine x 2)     Status: None   Collection Time: 02/13/21  8:03 PM   Specimen: BLOOD  Result Value Ref Range Status   Specimen Description   Final    BLOOD RIGHT ANTECUBITAL Performed at [facility] [address], [place], [place] [address]    Special Requests   Final    BOTTLES DRAWN AEROBIC AND ANAEROBIC Blood Culture adequate volume Performed at [facility] [address], [place], [place] [address]    Culture   Final    NO GROWTH 5 DAYS Performed at [facility], [place] [address] [place], [place] [address]    Report Status 02/18/2021 FINAL  Final  Resp Panel by RT-PCR (Flu A&B, Covid) Nasopharyngeal Swab     Status: Abnormal   Collection Time: 02/13/21  9:49 PM   Specimen: Nasopharyngeal Swab; Nasopharyngeal(NP) swabs in vial transport medium  Result Value Ref Range Status   SARS Coronavirus 2 by RT PCR NEGATIVE NEGATIVE Final    Comment: (NOTE) SARS-CoV-2 target nucleic acids are NOT DETECTED.  The SARS-CoV-2 RNA is generally detectable in upper respiratory specimens during the acute phase of infection. The lowest concentration of SARS-CoV-2 viral copies this assay can detect is 138 copies/mL. A negative result does not preclude SARS-Cov-2 infection and should not be used as the sole basis for treatment or other patient management decisions. A negative result may occur with  improper specimen collection/handling, submission of specimen other than nasopharyngeal swab, presence of viral mutation(s) within the areas targeted by this assay, and inadequate number of viral copies(<138 copies/mL).  A negative result must be combined with clinical observations, patient history, and epidemiological information. The expected result is Negative.  Fact Sheet for Patients:  EntrepreneurPulse.com.au  Fact Sheet for Healthcare Providers:  IncredibleEmployment.be  This test is no t yet approved or cleared by the [place] FDA and  has been authorized for detection and/or diagnosis of SARS-CoV-2 by FDA under an Emergency Use Authorization (EUA). This EUA will remain  in effect (meaning this test can be used) for the duration of the COVID-19 declaration under Section 564(b)(1) of the Act, 21 U.S.C.section 360bbb-3(b)(1), unless the authorization is terminated  or revoked sooner.       Influenza A by PCR POSITIVE (A) NEGATIVE Final   Influenza B by PCR NEGATIVE NEGATIVE Final    Comment: (NOTE) The Xpert Xpress SARS-CoV-2/FLU/RSV plus assay is intended as an aid in the diagnosis of influenza from Nasopharyngeal swab specimens and should not be used as a sole basis for treatment. Nasal washings and aspirates are unacceptable for Xpert Xpress SARS-CoV-2/FLU/RSV testing.  Fact Sheet for Patients: EntrepreneurPulse.com.au  Fact Sheet for Healthcare Providers: IncredibleEmployment.be  This test is not yet approved or cleared by the [place] FDA and has been authorized for detection and/or diagnosis of SARS-CoV-2 by FDA under an Emergency Use Authorization (EUA). This EUA will remain in effect (meaning this test can be used) for the duration of the COVID-19 declaration under Section 564(b)(1) of the Act, 21 U.S.C. section 360bbb-3(b)(1), unless the authorization is terminated or revoked.  Performed at [facility], [place] [address], [place], [place] [address]   Urine Culture     Status: Abnormal   Collection Time: 02/13/21 11:00 PM   Specimen: Urine, Clean Catch  Result Value Ref Range  Status   Specimen Description   Final    URINE, CLEAN CATCH Performed at [facility], [place] [address], [place], [place] [address]    Special Requests   Final    NONE Performed at [facility], [place] [address], [place], [place] [address]    Culture (A)  Final    <10,000 COLONIES/mL INSIGNIFICANT GROWTH Performed at [facility] [address], [place], [place] [address]    Report Status 02/14/2021 FINAL  Final  Resp Panel by RT-PCR (Flu A&B, Covid) Nasopharyngeal Swab     Status: None   Collection Time: 02/19/21  8:09 AM   Specimen: Nasopharyngeal Swab; Nasopharyngeal(NP) swabs in vial transport medium  Result Value Ref Range Status   SARS Coronavirus 2 by RT PCR NEGATIVE NEGATIVE Final    Comment: (NOTE) SARS-CoV-2 target nucleic acids are NOT DETECTED.  The SARS-CoV-2 RNA is generally detectable in upper respiratory specimens during the acute phase of infection. The lowest concentration of SARS-CoV-2 viral copies this assay can detect is 138 copies/mL. A negative result does not preclude SARS-Cov-2 infection and should not be used as the sole basis for treatment or other patient management decisions. A negative result may occur with  improper specimen collection/handling, submission of specimen other than nasopharyngeal swab, presence of viral mutation(s) within the areas targeted by this assay, and inadequate number of viral copies(<138 copies/mL). A negative result must be combined with clinical observations, patient history, and epidemiological information. The expected result is Negative.  Fact Sheet for Patients:  EntrepreneurPulse.com.au  Fact Sheet for Healthcare Providers:  IncredibleEmployment.be  This test is no t yet approved or cleared by the [place] FDA and  has been authorized for detection and/or diagnosis of SARS-CoV-2 by FDA under an Emergency Use Authorization (EUA). This  EUA will remain  in effect (meaning this test can be used) for the duration of the COVID-19 declaration under Section 564(b)(1) of the Act, 21 U.S.C.section 360bbb-3(b)(1), unless the authorization is terminated  or revoked sooner.       Influenza A by PCR NEGATIVE NEGATIVE Final   Influenza B by PCR NEGATIVE NEGATIVE Final    Comment: (NOTE) The Xpert Xpress SARS-CoV-2/FLU/RSV plus assay is intended as an aid in the diagnosis of influenza from Nasopharyngeal swab specimens and should not be used as a sole basis for treatment. Nasal washings and aspirates are unacceptable for Xpert Xpress SARS-CoV-2/FLU/RSV testing.  Fact Sheet for Patients: EntrepreneurPulse.com.au  Fact Sheet for Healthcare Providers: IncredibleEmployment.be  This test is not yet approved or cleared by the [place] FDA and has been authorized for detection and/or diagnosis of SARS-CoV-2 by FDA under an Emergency Use Authorization (EUA). This EUA will remain in effect (meaning this test can be used) for the duration of the COVID-19 declaration under Section 564(b)(1) of the Act, 21 U.S.C. section 360bbb-3(b)(1), unless the authorization is terminated or revoked.  Performed at [facility], [place] [address] [place], [place] [address]      Labs: Basic Metabolic Panel: Recent Labs  Lab 02/13/21 2003 02/14/21 8811 02/15/21 0323 02/16/21 0357 02/17/21 0324 02/18/21 0325 02/19/21 0318  NA 140   < > 143 140 138 141 139  K 4.8   < > 4.3 3.9 4.0  3.6 4.4  CL 105   < > 112* 111 106 107 110  CO2 25   < > '24 25 26 26 23  ' GLUCOSE 181*   < > 105* 134* 237* 171* 106*  BUN 53*   < > 38* 25* 21 24* 21  CREATININE 1.98*   < > 1.55* 1.37* 1.10* 1.22* 1.15*  CALCIUM 8.7*   < > 8.0* 8.1* 8.6* 8.4* 8.1*  MG 2.3  --  2.3  --  1.9 1.8 2.2  PHOS  --   --  3.0 2.5 2.2* 2.5  --    < > = values in this interval not displayed.   Liver Function Tests: Recent Labs   Lab 02/15/21 0323 02/16/21 0357 02/17/21 0324 02/18/21 0325  ALBUMIN 2.6* 2.7* 3.2* 2.9*   No results for input(s): LIPASE, AMYLASE in the last 168 hours. No results for input(s): AMMONIA in the last 168 hours. CBC: Recent Labs  Lab 02/13/21 2003 02/14/21 0311 02/15/21 0323 02/16/21 0357 02/17/21 0324 02/18/21 0325  WBC 6.9 5.8 4.7 4.3 2.8* 9.0  NEUTROABS 5.2  --   --  1.8  --  5.7  HGB 12.0 10.3* 10.1* 10.3* 11.7* 10.3*  HCT 37.3 32.9* 33.3* 32.5* 36.6 32.2*  MCV 93.0 94.3 96.2 95.0 91.7 92.0  PLT 235 189 148* 148* 157 164   Cardiac Enzymes: Recent Labs  Lab 02/13/21 2240  CKTOTAL 401*   BNP: BNP (last 3 results) Recent Labs    08/02/20 0559 08/05/20 0312 08/07/20 0236  BNP 440.3* 501.4* 596.2*    ProBNP (last 3 results) No results for input(s): PROBNP in the last 8760 hours.  CBG: Recent Labs  Lab 02/18/21 0745 02/18/21 1201 02/18/21 1600 02/18/21 2251 02/19/21 0749  GLUCAP 126* 118* 138* 157* 79       Signed:  Irine Seal MD.  [facility] 02/19/2021, 10:56 AM

## 2021-02-20 DIAGNOSIS — N179 Acute kidney failure, unspecified: Secondary | ICD-10-CM | POA: Diagnosis not present

## 2021-02-20 LAB — GLUCOSE, CAPILLARY
Glucose-Capillary: 160 mg/dL — ABNORMAL HIGH (ref 70–99)
Glucose-Capillary: 187 mg/dL — ABNORMAL HIGH (ref 70–99)

## 2021-02-20 MED ORDER — PREGABALIN 75 MG PO CAPS: 75.0000 mg | ORAL_CAPSULE | Freq: Two times a day (BID) | ORAL | 0 refills | Status: DC

## 2021-02-20 NOTE — Discharge Summary (Addendum)
Physician Discharge Summary  Shirley Chen WUX:[id number] DOB: Jan 11, 1956 DOA: 02/13/2021  PCP: Marrian Salvage, FNP  Admit date: 02/13/2021 Discharge date: 02/20/2021  Time spent: 50 minutes  Recommendations for Chen Follow-up:  Follow-up with MD at skilled nursing facility.  Patient will need a basic metabolic profile, magnesium level checked in 1 week to follow-up on electrolytes and renal function. Repeat CMP/CBC/Mag in about 1 week Consider repeat renal Shirley Chen to follow borderline right hydronephrosis and trace perinephric fluid Holding lasix/spironolactone until 11/22, follow BP/lytes/renal function prior to resumption Follow blood sugars Chen, adjust regimen as needed   Discharge Diagnoses:  Principal Problem:   Acute kidney injury (Shirley Chen) Active Problems:   PAD (peripheral artery disease) (Shirley Chen)   Hypertension associated with diabetes (Shirley Chen)   Diabetes mellitus with peripheral vascular disease (Shirley Chen)   Hyperlipidemia associated with type 2 diabetes mellitus ([place])   Acute renal failure (Shirley Chen)   Weakness   Influenza A   AKI (acute kidney injury) (Shirley Chen)   Discharge Condition: Stable and improved.  Diet recommendation: Carb modified diet  Filed Weights   02/13/21 2359  Weight: 85.7 kg    History of present illness:  HPI per Dr. Parks Ranger is a 65 y.o. female with medical history significant for HFpEF (EF 60-65%, G2 DD by TTE 08/04/2020), insulin-dependent T2DM, HTN, HLD, PVD w/ hx of necrotizing abscess s/p left BKA, chronic back pain who presented to the ED for evaluation of weakness.  Patient last admitted 09/25/2020-10/04/2020 for sepsis due to ESBL E. coli and COVID-19 infection.  She was treated with meropenem, remdesivir.  Also noted to have AKI which improved with IV fluid hydration.  She was discharged to SNF.  She was admitted with generalized weakness related to influenza A and AKI due to dehydration.  She's gradually improved with  IVF and treatment with tamiflu.    She's now improved.  Discharging to SNF.  Hospital Course:  1 acute kidney injury -Likely secondary to prerenal azotemia in the setting of diuretics of spironolactone and Lasix. -Patient on admission noted to be clinically dry on examination but has improved. -Urinalysis with 30 of protein, nitrite negative leukocytes negative.  Urine sodium of 39, urine creatinine of 114.35. ->FeNa prerenal -Creatinine improved during the hospitalization and was down to 1.15 from 1.98 on admission.  -baseline creatinine 0.9 on 10/01/2020 -Patient with good urine output during the hospitalization. -Renal ultrasound with borderline right hydronephrosis and possible trace perinephritic fluid.  Incomplete evaluation of the left kidney.  Consider repeat renal Shirley Chen. -Initially placed on IV fluids which were subsequently discontinued. -Patient's Lasix and spironolactone were held and will be resumed 1 week post discharge. -Chen follow-up.   2.  Influenza A -Likely etiology of patient's generalized weakness. -Presented with complaints of congestion, noted to have minimal expiratory wheezing on examination - now resolved -COVID-19 PCR negative. -Status post 5 days Tamiflu.   -Patient also maintained on Mucinex, Claritin, Flonase and bronchodilators.    3.  Dehydration -resolved with IVF  4.  Elevated troponin -Troponin minimally elevated and flattened likely in the setting of AKI. -Patient with no chest pain, EKG with no ischemic changes. -No further cardiac work-up needed at this time.  5.  Peripheral vascular disease status post left BKA -Patient intent on home regimen Zetia, statin.  6.  Insulin-dependent type 2 diabetes mellitus -Hemoglobin A1c 8.0 (11/102022). -Patient placed on Semglee at a decreased dose as well as SSI.  Continue lantus at 20 units daily with SSI.  Adjust as needed.  7.  Hypertension -Blood pressure noted to be borderline on  admission however since admission has improved.   -Patient maintained on home regimen Coreg, Norvasc, hydralazine.  -Spironolactone, Lasix held during the hospitalization and will be resumed 1 week post discharge.    8.  Hyperlipidemia -Patient maintained on statin, Zetia.    9.  Chronic back pain/sciatica -Improved on scheduled Ultram and oxycodone as needed. -Patient maintained on home regimen Lyrica. -Patient will be discharged on 3 more days of scheduled Ultram and home regimen Percocet.  10.  Generalized weakness Likely secondary to problem #2. -PT/OT assessed patient and recommended SNF. -Patient to be discharged to SNF  11.  HFpEF -2D echo from 08/04/2020 with EF of 60 to 65%, G2 DD.  -Was on IV fluids which have been saline locked.  -Lasix, spironolactone were held during the hospitalization will be resumed 1 week post discharge.  Follow up BMP within a week of discharge.   Procedures: Renal ultrasound 02/14/2021 Chest x-ray 02/13/2021    Consultations: None  Discharge Exam: Vitals:   02/20/21 0816 02/20/21 0838  BP: 140/63   Pulse: 69   Resp:    Temp:    SpO2:  93%   No complaints Asking about plan and eventual discharge.    General: No acute distress. Cardiovascular: RRR Lungs: unlabored Abdomen: Soft, nontender, nondistended  Neurological: Alert and oriented 3. Moves all extremities 4. Cranial nerves II through XII grossly intact. Extremities: L BKA  Discharge Instructions   Discharge Instructions     Diet Carb Modified   Complete by: As directed    Discharge instructions   Complete by: As directed    You were seen with influenza A and acute kidney injury.  You've improved with tamiflu and IV fluids.  Your lasix and spironolactone have been discontinued.  The plan is to resume these about 1 week after discharge on 02/26/21.  You had right kidney "fullness" on ultrasound which appeared mild.  You'll need repeat labs within 1 week.  You may need  a repeat ultrasound as an Chen to follow up the mild right sided hydronephrosis.   Return for new, recurrent, or worsening symptoms.  Please ask your PCP to request records from this hospitalization so they know what was done and what the next steps will be.   Increase activity slowly   Complete by: As directed       Allergies as of 02/20/2021       Reactions   Losartan Nausea And Vomiting   Morphine And Related    Pt states " I just got really sick"        Medication List     STOP taking these medications    gabapentin 300 MG capsule Commonly known as: NEURONTIN       TAKE these medications    Accu-Chek Guide test strip Generic drug: glucose blood USE 1 STRIP TO CHECK BLOOD  GLUCOSE 3 TIMES DAILY   albuterol 108 (90 Base) MCG/ACT inhaler Commonly known as: VENTOLIN HFA Inhale 2 puffs into the lungs every 6 (six) hours as needed for wheezing or shortness of breath.   amLODipine 10 MG tablet Commonly known as: NORVASC TAKE 1 TABLET BY MOUTH  DAILY   atorvastatin 40 MG tablet Commonly known as: LIPITOR TAKE 1 TABLET BY MOUTH  DAILY   blood glucose meter kit and supplies Kit Dispense based on patient and insurance preference. Use up to four times daily as directed. (FOR ICD-9  250.00, 250.01).   calcium carbonate (dosed in mg elemental calcium) 1250 MG/5ML Susp Take 5 mLs (500 mg of elemental calcium total) by mouth every 6 (six) hours as needed for indigestion. Notes to patient: Resume home regimen   carvedilol 25 MG tablet Commonly known as: COREG TAKE 1 TABLET BY MOUTH  TWICE DAILY WITH A MEAL   Cholecalciferol 25 MCG (1000 UT) Chew Chew 1,000 Units by mouth daily. Notes to patient: Resume home regimen   colchicine 0.6 MG tablet Take 1 tablet (0.6 mg total) by mouth daily.   ezetimibe 10 MG tablet Commonly known as: ZETIA TAKE 1 TABLET BY MOUTH  DAILY   feeding supplement (GLUCERNA SHAKE) Liqd Take 237 mLs by mouth 2 (two) times daily  between meals. Notes to patient: Resume home regimen   fluticasone 50 MCG/ACT nasal spray Commonly known as: FLONASE Place 2 sprays into both nostrils daily for 7 days.   furosemide 40 MG tablet Commonly known as: LASIX Take 1 tablet (40 mg total) by mouth daily. Start taking on: February 26, 2021 What changed: These instructions start on February 26, 2021. If you are unsure what to do until then, ask your doctor or other care provider.   glucose monitoring kit monitoring kit 1 each by Does not apply route 4 (four) times daily - after meals and at bedtime. 1 month Diabetic Testing Supplies for QAC-QHS accuchecks. Any brand OK. Diagnosis E11.65   guaiFENesin 600 MG 12 hr tablet Commonly known as: MUCINEX Take 2 tablets (1,200 mg total) by mouth 2 (two) times daily for 5 days.   hydrALAZINE 50 MG tablet Commonly known as: APRESOLINE Take 1 tablet (50 mg total) by mouth 3 (three) times daily.   hydrocortisone cream 0.5 % Apply topically 2 (two) times daily for 3 days. Apply to lateral aspect of left stump, right LE after cleansing, twice daily. No dressing/leave open to air.   insulin aspart 100 UNIT/ML injection Commonly known as: novoLOG 0-15 Units, Subcutaneous, 3 times daily with meals CBG < 70: Implement Hypoglycemia measures CBG 70 - 120: 0 units CBG 121 - 150: 2 units CBG 151 - 200: 3 units CBG 201 - 250: 5 units CBG 251 - 300: 8 units CBG 301 - 350: 11 units CBG 351 - 400: 15 units CBG > 400: call MD What changed:  how much to take how to take this when to take this   insulin glargine 100 UNIT/ML injection Commonly known as: LANTUS Inject 0.2 mLs (20 Units total) into the skin at bedtime. What changed: how much to take   loratadine 10 MG tablet Commonly known as: CLARITIN Take 1 tablet (10 mg total) by mouth daily.   mometasone-formoterol 200-5 MCG/ACT Aero Commonly known as: DULERA Inhale 2 puffs into the lungs 2 (two) times daily.   ondansetron 4 MG  tablet Commonly known as: ZOFRAN Take 4 mg by mouth every 6 (six) hours as needed for nausea or vomiting. Notes to patient: Resume home regimen   oxyCODONE-acetaminophen 7.5-325 MG tablet Commonly known as: PERCOCET Take 1 tablet by mouth every 6 (six) hours as needed for moderate pain. Notes to patient: Last dose given 11/11 04:40pm   potassium & sodium phosphates 280-160-250 MG Pack Commonly known as: PHOS-NAK Take 1 packet by mouth 4 (four) times daily -  with meals and at bedtime for 2 days.   Potassium Chloride ER 20 MEQ Tbcr Take 1 tablet by mouth daily. Start taking on: February 26, 2021 What changed: These  instructions start on February 26, 2021. If you are unsure what to do until then, ask your doctor or other care provider.   pregabalin 75 MG capsule Commonly known as: LYRICA Take 1 capsule (75 mg total) by mouth 2 (two) times daily.   senna-docusate 8.6-50 MG tablet Commonly known as: Senokot-S Take 1 tablet by mouth 2 (two) times daily as needed for mild constipation. Notes to patient: Last dose given 11/13 11:33pm   spironolactone 25 MG tablet Commonly known as: ALDACTONE Take 1 tablet (25 mg total) by mouth daily. Start taking on: February 26, 2021 What changed: These instructions start on February 26, 2021. If you are unsure what to do until then, ask your doctor or other care provider.   traMADol 50 MG tablet Commonly known as: ULTRAM Take 1 tablet (50 mg total) by mouth 3 (three) times daily for 3 days.        Allergies  Allergen Reactions   Losartan Nausea And Vomiting   Morphine And Related     Pt states " I just got really sick"    Contact information for follow-up providers     MD at snf Follow up.               Contact information for after-discharge care     Destination     [facility] .   Service: Skilled Nursing Contact information: [address] [place] [phone]                       The results of significant diagnostics from this hospitalization (including imaging, microbiology, ancillary and laboratory) are listed below for reference.    Significant Diagnostic Studies: Shirley RENAL  Result Date: 02/14/2021 CLINICAL DATA:  Acute renal failure. EXAM: RENAL / URINARY TRACT ULTRASOUND COMPLETE COMPARISON:  Renal ultrasound 08/03/2020 FINDINGS: Right Kidney: Renal measurements: 11.1 x 5.5 x 5.4 cm = volume: 173 mL. Echogenicity within normal limits. Borderline hydronephrosis. No mass. Possible trace perinephric fluid. Left Kidney: Renal measurements: 10.0 x 4.8 cm. The patient terminated the examination after only limited images of the left kidney were obtained, precluding adequate evaluation for mass or hydronephrosis. Bladder: Appears normal for degree of bladder distention. Other: None. IMPRESSION: 1. Borderline right hydronephrosis and possible trace perinephric fluid. 2. Incomplete evaluation of the left kidney. Electronically Signed   By: Logan Bores M.D.   On: 02/14/2021 13:25   DG Chest Port 1 View  Result Date: 02/13/2021 CLINICAL DATA:  Increased weakness EXAM: PORTABLE CHEST 1 VIEW COMPARISON:  Chest x-ray 08/05/2020 FINDINGS: The heart and mediastinal contours are within normal limits. Aortic calcification. Similar-appearing hilar calcification likely sequelae of prior granulomatous disease. No focal consolidation. No pulmonary edema. No pleural effusion. No pneumothorax. No acute osseous abnormality. IMPRESSION: No active disease. Electronically Signed   By: Iven Finn M.D.   On: 02/13/2021 20:20    Microbiology: Recent Results (from the past 240 hour(s))  Culture, blood (routine x 2)     Status: None   Collection Time: 02/13/21  8:03 PM   Specimen: BLOOD  Result Value Ref Range Status   Specimen Description   Final    BLOOD BLOOD LEFT HAND Performed at [facility] [address], [place], [place] [address]    Special  Requests   Final    BOTTLES DRAWN AEROBIC AND ANAEROBIC Blood Culture adequate volume Performed at [facility] [address], [place], [place] [address]  Culture   Final    NO GROWTH 5 DAYS Performed at [facility], [place] [address] [place], [place] [address]    Report Status 02/18/2021 FINAL  Final  Culture, blood (routine x 2)     Status: None   Collection Time: 02/13/21  8:03 PM   Specimen: BLOOD  Result Value Ref Range Status   Specimen Description   Final    BLOOD RIGHT ANTECUBITAL Performed at [facility] [address], [place], [place] [address]    Special Requests   Final    BOTTLES DRAWN AEROBIC AND ANAEROBIC Blood Culture adequate volume Performed at [facility] [address], [place], [place] [address]    Culture   Final    NO GROWTH 5 DAYS Performed at [facility], [place] [address], [place], [place] [address]    Report Status 02/18/2021 FINAL  Final  Resp Panel by RT-PCR (Flu A&B, Covid) Nasopharyngeal Swab     Status: Abnormal   Collection Time: 02/13/21  9:49 PM   Specimen: Nasopharyngeal Swab; Nasopharyngeal(NP) swabs in vial transport medium  Result Value Ref Range Status   SARS Coronavirus 2 by RT PCR NEGATIVE NEGATIVE Final    Comment: (NOTE) SARS-CoV-2 target nucleic acids are NOT DETECTED.  The SARS-CoV-2 RNA is generally detectable in upper respiratory specimens during the acute phase of infection. The lowest concentration of SARS-CoV-2 viral copies this assay can detect is 138 copies/mL. A negative result does not preclude SARS-Cov-2 infection and should not be used as the sole basis for treatment or other patient management decisions. A negative result may occur with  improper specimen collection/handling, submission of specimen other than nasopharyngeal swab, presence of viral mutation(s) within the areas targeted by this assay, and inadequate number of viral copies(<138  copies/mL). A negative result must be combined with clinical observations, patient history, and epidemiological information. The expected result is Negative.  Fact Sheet for Patients:  EntrepreneurPulse.com.au  Fact Sheet for Healthcare Providers:  IncredibleEmployment.be  This test is no t yet approved or cleared by the [place] FDA and  has been authorized for detection and/or diagnosis of SARS-CoV-2 by FDA under an Emergency Use Authorization (EUA). This EUA will remain  in effect (meaning this test can be used) for the duration of the COVID-19 declaration under Section 564(b)(1) of the Act, 21 U.S.C.section 360bbb-3(b)(1), unless the authorization is terminated  or revoked sooner.       Influenza A by PCR POSITIVE (A) NEGATIVE Final   Influenza B by PCR NEGATIVE NEGATIVE Final    Comment: (NOTE) The Xpert Xpress SARS-CoV-2/FLU/RSV plus assay is intended as an aid in the diagnosis of influenza from Nasopharyngeal swab specimens and should not be used as a sole basis for treatment. Nasal washings and aspirates are unacceptable for Xpert Xpress SARS-CoV-2/FLU/RSV testing.  Fact Sheet for Patients: EntrepreneurPulse.com.au  Fact Sheet for Healthcare Providers: IncredibleEmployment.be  This test is not yet approved or cleared by the [place] FDA and has been authorized for detection and/or diagnosis of SARS-CoV-2 by FDA under an Emergency Use Authorization (EUA). This EUA will remain in effect (meaning this test can be used) for the duration of the COVID-19 declaration under Section 564(b)(1) of the Act, 21 U.S.C. section 360bbb-3(b)(1), unless the authorization is terminated or revoked.  Performed at [facility], [place] [address], [place], [place] [address]   Urine Culture     Status: Abnormal   Collection Time: 02/13/21 11:00  PM   Specimen: Urine, Clean Catch  Result  Value Ref Range Status   Specimen Description   Final    URINE, CLEAN CATCH Performed at [facility], [place] [address], [place], [place] [address]    Special Requests   Final    NONE Performed at [facility], [place] [address], [place], [place] [address]    Culture (Christiana Gurevich)  Final    <10,000 COLONIES/mL INSIGNIFICANT GROWTH Performed at [facility] [address], [place], [place] [address]    Report Status 02/14/2021 FINAL  Final  Resp Panel by RT-PCR (Flu Jens Siems&B, Covid) Nasopharyngeal Swab     Status: None   Collection Time: 02/19/21  8:09 AM   Specimen: Nasopharyngeal Swab; Nasopharyngeal(NP) swabs in vial transport medium  Result Value Ref Range Status   SARS Coronavirus 2 by RT PCR NEGATIVE NEGATIVE Final    Comment: (NOTE) SARS-CoV-2 target nucleic acids are NOT DETECTED.  The SARS-CoV-2 RNA is generally detectable in upper respiratory specimens during the acute phase of infection. The lowest concentration of SARS-CoV-2 viral copies this assay can detect is 138 copies/mL. Adley Castello negative result does not preclude SARS-Cov-2 infection and should not be used as the sole basis for treatment or other patient management decisions. Seamus Warehime negative result may occur with  improper specimen collection/handling, submission of specimen other than nasopharyngeal swab, presence of viral mutation(s) within the areas targeted by this assay, and inadequate number of viral copies(<138 copies/mL). Elissia Spiewak negative result must be combined with clinical observations, patient history, and epidemiological information. The expected result is Negative.  Fact Sheet for Patients:  EntrepreneurPulse.com.au  Fact Sheet for Healthcare Providers:  IncredibleEmployment.be  This test is no t yet approved or cleared by the [place] FDA and  has been authorized for detection and/or diagnosis of SARS-CoV-2 by FDA under an Emergency Use  Authorization (EUA). This EUA will remain  in effect (meaning this test can be used) for the duration of the COVID-19 declaration under Section 564(b)(1) of the Act, 21 U.S.C.section 360bbb-3(b)(1), unless the authorization is terminated  or revoked sooner.       Influenza Keondrick Dilks by PCR NEGATIVE NEGATIVE Final   Influenza B by PCR NEGATIVE NEGATIVE Final    Comment: (NOTE) The Xpert Xpress SARS-CoV-2/FLU/RSV plus assay is intended as an aid in the diagnosis of influenza from Nasopharyngeal swab specimens and should not be used as Tekeya Geffert sole basis for treatment. Nasal washings and aspirates are unacceptable for Xpert Xpress SARS-CoV-2/FLU/RSV testing.  Fact Sheet for Patients: EntrepreneurPulse.com.au  Fact Sheet for Healthcare Providers: IncredibleEmployment.be  This test is not yet approved or cleared by the [place] FDA and has been authorized for detection and/or diagnosis of SARS-CoV-2 by FDA under an Emergency Use Authorization (EUA). This EUA will remain in effect (meaning this test can be used) for the duration of the COVID-19 declaration under Section 564(b)(1) of the Act, 21 U.S.C. section 360bbb-3(b)(1), unless the authorization is terminated or revoked.  Performed at [facility], [place] [address] [place], [place] [address]      Labs: Basic Metabolic Panel: Recent Labs  Lab 02/13/21 2003 02/14/21 3976 02/15/21 0323 02/16/21 0357 02/17/21 0324 02/18/21 0325 02/19/21 0318  NA 140   < > 143 140 138 141 139  K 4.8   < > 4.3 3.9 4.0 3.6 4.4  CL 105   < > 112* 111 106 107 110  CO2 25   < > _0 26  23  GLUCOSE 181*   < > 105* 134* 237* 171* 106*  BUN 53*   < > 38* 25* 21 24* 21  CREATININE 1.98*   < > 1.55* 1.37* 1.10* 1.22* 1.15*  CALCIUM 8.7*   < > 8.0* 8.1* 8.6* 8.4* 8.1*  MG 2.3  --  2.3  --  1.9 1.8 2.2  PHOS  --   --  3.0 2.5 2.2* 2.5  --    < > = values in this interval not displayed.   Liver  Function Tests: Recent Labs  Lab 02/15/21 0323 02/16/21 0357 02/17/21 0324 02/18/21 0325  ALBUMIN 2.6* 2.7* 3.2* 2.9*   No results for input(s): LIPASE, AMYLASE in the last 168 hours. No results for input(s): AMMONIA in the last 168 hours. CBC: Recent Labs  Lab 02/13/21 2003 02/14/21 0311 02/15/21 0323 02/16/21 0357 02/17/21 0324 02/18/21 0325  WBC 6.9 5.8 4.7 4.3 2.8* 9.0  NEUTROABS 5.2  --   --  1.8  --  5.7  HGB 12.0 10.3* 10.1* 10.3* 11.7* 10.3*  HCT 37.3 32.9* 33.3* 32.5* 36.6 32.2*  MCV 93.0 94.3 96.2 95.0 91.7 92.0  PLT 235 189 148* 148* 157 164   Cardiac Enzymes: Recent Labs  Lab 02/13/21 2240  CKTOTAL 401*   BNP: BNP (last 3 results) Recent Labs    08/02/20 0559 08/05/20 0312 08/07/20 0236  BNP 440.3* 501.4* 596.2*    ProBNP (last 3 results) No results for input(s): PROBNP in the last 8760 hours.  CBG: Recent Labs  Lab 02/19/21 1150 02/19/21 1708 02/19/21 2210 02/20/21 0749 02/20/21 1157  GLUCAP 127* 193* 180* 160* 187*       Signed:  Fayrene Helper MD.  [facility] 02/20/2021, 1:37 PM

## 2021-02-20 NOTE — Plan of Care (Signed)
Discharge hand off given to April at [facility]. Waiting for PTAR to pick up.

## 2021-02-20 NOTE — TOC Transition Note (Signed)
Transition of Care [facility]) - CM/SW Discharge Note  Patient Details  Name: Shirley Chen MRN: [id number] Date of Birth: 1955/05/02  [facility]) CM/SW Contact:  Ewing Schlein, LCSW Phone Number: 02/20/2021, 11:03 AM  Clinical Narrative: CSW confirmed with Malena Peer at [facility] that patient can be admitted today. Patient will go to room 401 and the number for report is [phone]. Discharge summary, discharge orders, and SNF transfer report faxed to facility in hub. Medical necessity form done; PTAR scheduled. Discharge packet completed. CSW updated RN. Patient notified of discharge. TOC signing off.  Final next level of care: Long Term Nursing Home Barriers to Discharge: Barriers Resolved  Patient Goals and CMS Choice Patient states their goals for this hospitalization and ongoing recovery are:: Discharge to [facility] for [facility] CMS Medicare.gov Compare Post Acute Care list provided to:: Patient Choice offered to / list presented to : Patient  Discharge Placement        Patient chooses bed at: [facility] Patient to be transferred to facility by: PTAR Patient and family notified of of transfer: 02/20/21  Discharge Plan and Services In-house Referral: Clinical Social Work         DME Arranged: N/A DME Agency: NA  Readmission Risk Interventions No flowsheet data found.

## 2021-02-20 NOTE — Plan of Care (Signed)
  Problem: Clinical Measurements: Goal: Ability to maintain clinical measurements within normal limits will improve Outcome: Progressing   Problem: Clinical Measurements: Goal: Will remain free from infection Outcome: Progressing   Problem: Activity: Goal: Risk for activity intolerance will decrease Outcome: Progressing   

## 2021-02-25 DIAGNOSIS — J208 Acute bronchitis due to other specified organisms: Secondary | ICD-10-CM | POA: Diagnosis not present

## 2021-02-25 DIAGNOSIS — E785 Hyperlipidemia, unspecified: Secondary | ICD-10-CM | POA: Diagnosis not present

## 2021-02-25 DIAGNOSIS — N179 Acute kidney failure, unspecified: Secondary | ICD-10-CM | POA: Diagnosis not present

## 2021-02-25 DIAGNOSIS — I5032 Chronic diastolic (congestive) heart failure: Secondary | ICD-10-CM | POA: Diagnosis not present

## 2021-02-25 DIAGNOSIS — M5430 Sciatica, unspecified side: Secondary | ICD-10-CM | POA: Diagnosis not present

## 2021-02-25 DIAGNOSIS — I739 Peripheral vascular disease, unspecified: Secondary | ICD-10-CM | POA: Diagnosis not present

## 2021-02-25 DIAGNOSIS — Z89512 Acquired absence of left leg below knee: Secondary | ICD-10-CM | POA: Diagnosis not present

## 2021-02-25 DIAGNOSIS — Z794 Long term (current) use of insulin: Secondary | ICD-10-CM | POA: Diagnosis not present

## 2021-02-25 DIAGNOSIS — S98139A Complete traumatic amputation of one unspecified lesser toe, initial encounter: Secondary | ICD-10-CM | POA: Diagnosis not present

## 2021-02-25 DIAGNOSIS — E118 Type 2 diabetes mellitus with unspecified complications: Secondary | ICD-10-CM | POA: Diagnosis not present

## 2021-02-25 DIAGNOSIS — M549 Dorsalgia, unspecified: Secondary | ICD-10-CM | POA: Diagnosis not present

## 2021-02-27 DIAGNOSIS — G8929 Other chronic pain: Secondary | ICD-10-CM | POA: Diagnosis not present

## 2021-02-27 DIAGNOSIS — J208 Acute bronchitis due to other specified organisms: Secondary | ICD-10-CM | POA: Diagnosis not present

## 2021-02-27 DIAGNOSIS — Z794 Long term (current) use of insulin: Secondary | ICD-10-CM | POA: Diagnosis not present

## 2021-02-27 DIAGNOSIS — M549 Dorsalgia, unspecified: Secondary | ICD-10-CM | POA: Diagnosis not present

## 2021-02-27 DIAGNOSIS — M5430 Sciatica, unspecified side: Secondary | ICD-10-CM | POA: Diagnosis not present

## 2021-03-06 DIAGNOSIS — E1165 Type 2 diabetes mellitus with hyperglycemia: Secondary | ICD-10-CM | POA: Diagnosis not present

## 2021-03-06 DIAGNOSIS — R0982 Postnasal drip: Secondary | ICD-10-CM | POA: Diagnosis not present

## 2021-03-06 DIAGNOSIS — J40 Bronchitis, not specified as acute or chronic: Secondary | ICD-10-CM | POA: Diagnosis not present

## 2021-03-26 ENCOUNTER — Observation Stay (HOSPITAL_COMMUNITY)
Admission: EM | Admit: 2021-03-26 | Discharge: 2021-03-27 | Disposition: A | Discharge status: Home / Self Care | Payer: Medicare Other | Attending: Internal Medicine | Admitting: Internal Medicine

## 2021-03-26 ENCOUNTER — Other Ambulatory Visit: Payer: Self-pay

## 2021-03-26 ENCOUNTER — Emergency Department (HOSPITAL_COMMUNITY): Payer: Medicare Other

## 2021-03-26 ENCOUNTER — Encounter (HOSPITAL_COMMUNITY): Payer: Self-pay | Admitting: Emergency Medicine

## 2021-03-26 DIAGNOSIS — Z794 Long term (current) use of insulin: Secondary | ICD-10-CM | POA: Insufficient documentation

## 2021-03-26 DIAGNOSIS — Z20822 Contact with and (suspected) exposure to covid-19: Secondary | ICD-10-CM | POA: Diagnosis not present

## 2021-03-26 DIAGNOSIS — I129 Hypertensive chronic kidney disease with stage 1 through stage 4 chronic kidney disease, or unspecified chronic kidney disease: Secondary | ICD-10-CM | POA: Diagnosis not present

## 2021-03-26 DIAGNOSIS — N390 Urinary tract infection, site not specified: Secondary | ICD-10-CM | POA: Diagnosis present

## 2021-03-26 DIAGNOSIS — N1330 Unspecified hydronephrosis: Secondary | ICD-10-CM | POA: Diagnosis not present

## 2021-03-26 DIAGNOSIS — E1122 Type 2 diabetes mellitus with diabetic chronic kidney disease: Secondary | ICD-10-CM | POA: Insufficient documentation

## 2021-03-26 DIAGNOSIS — N1832 Chronic kidney disease, stage 3b: Secondary | ICD-10-CM

## 2021-03-26 DIAGNOSIS — N179 Acute kidney failure, unspecified: Secondary | ICD-10-CM | POA: Diagnosis not present

## 2021-03-26 DIAGNOSIS — Z79899 Other long term (current) drug therapy: Secondary | ICD-10-CM | POA: Diagnosis not present

## 2021-03-26 DIAGNOSIS — N1831 Chronic kidney disease, stage 3a: Secondary | ICD-10-CM | POA: Diagnosis present

## 2021-03-26 DIAGNOSIS — N12 Tubulo-interstitial nephritis, not specified as acute or chronic: Secondary | ICD-10-CM | POA: Diagnosis not present

## 2021-03-26 DIAGNOSIS — Z8616 Personal history of COVID-19: Secondary | ICD-10-CM | POA: Insufficient documentation

## 2021-03-26 DIAGNOSIS — R339 Retention of urine, unspecified: Secondary | ICD-10-CM

## 2021-03-26 DIAGNOSIS — M5431 Sciatica, right side: Secondary | ICD-10-CM | POA: Diagnosis present

## 2021-03-26 DIAGNOSIS — E1159 Type 2 diabetes mellitus with other circulatory complications: Secondary | ICD-10-CM | POA: Diagnosis present

## 2021-03-26 DIAGNOSIS — E119 Type 2 diabetes mellitus without complications: Secondary | ICD-10-CM

## 2021-03-26 DIAGNOSIS — E785 Hyperlipidemia, unspecified: Secondary | ICD-10-CM | POA: Diagnosis present

## 2021-03-26 DIAGNOSIS — A419 Sepsis, unspecified organism: Principal | ICD-10-CM

## 2021-03-26 DIAGNOSIS — I739 Peripheral vascular disease, unspecified: Secondary | ICD-10-CM | POA: Diagnosis present

## 2021-03-26 DIAGNOSIS — Z87891 Personal history of nicotine dependence: Secondary | ICD-10-CM | POA: Insufficient documentation

## 2021-03-26 LAB — CBC
HCT: 36.3 % (ref 36.0–46.0)
Hemoglobin: 11.8 g/dL — ABNORMAL LOW (ref 12.0–15.0)
MCH: 29.9 pg (ref 26.0–34.0)
MCHC: 32.5 g/dL (ref 30.0–36.0)
MCV: 92.1 fL (ref 80.0–100.0)
Platelets: 246 10*3/uL (ref 150–400)
RBC: 3.94 MIL/uL (ref 3.87–5.11)
RDW: 15.2 % (ref 11.5–15.5)
WBC: 14.9 10*3/uL — ABNORMAL HIGH (ref 4.0–10.5)
nRBC: 0 % (ref 0.0–0.2)

## 2021-03-26 LAB — CBC WITH DIFFERENTIAL/PLATELET
Abs Immature Granulocytes: 0.1 10*3/uL — ABNORMAL HIGH (ref 0.00–0.07)
Basophils Absolute: 0 10*3/uL (ref 0.0–0.1)
Basophils Relative: 0 %
Eosinophils Absolute: 0 10*3/uL (ref 0.0–0.5)
Eosinophils Relative: 0 %
HCT: 31.8 % — ABNORMAL LOW (ref 36.0–46.0)
Hemoglobin: 10.3 g/dL — ABNORMAL LOW (ref 12.0–15.0)
Immature Granulocytes: 1 %
Lymphocytes Relative: 10 %
Lymphs Abs: 1.2 10*3/uL (ref 0.7–4.0)
MCH: 29.9 pg (ref 26.0–34.0)
MCHC: 32.4 g/dL (ref 30.0–36.0)
MCV: 92.4 fL (ref 80.0–100.0)
Monocytes Absolute: 1 10*3/uL (ref 0.1–1.0)
Monocytes Relative: 8 %
Neutro Abs: 10 10*3/uL — ABNORMAL HIGH (ref 1.7–7.7)
Neutrophils Relative %: 81 %
Platelets: 224 10*3/uL (ref 150–400)
RBC: 3.44 MIL/uL — ABNORMAL LOW (ref 3.87–5.11)
RDW: 15.2 % (ref 11.5–15.5)
WBC: 12.3 10*3/uL — ABNORMAL HIGH (ref 4.0–10.5)
nRBC: 0 % (ref 0.0–0.2)

## 2021-03-26 LAB — COMPREHENSIVE METABOLIC PANEL
ALT: 18 U/L (ref 0–44)
AST: 15 U/L (ref 15–41)
Albumin: 3.2 g/dL — ABNORMAL LOW (ref 3.5–5.0)
Alkaline Phosphatase: 108 U/L (ref 38–126)
Anion gap: 9 (ref 5–15)
BUN: 65 mg/dL — ABNORMAL HIGH (ref 8–23)
CO2: 24 mmol/L (ref 22–32)
Calcium: 8.8 mg/dL — ABNORMAL LOW (ref 8.9–10.3)
Chloride: 105 mmol/L (ref 98–111)
Creatinine, Ser: 1.89 mg/dL — ABNORMAL HIGH (ref 0.44–1.00)
GFR, Estimated: 29 mL/min — ABNORMAL LOW (ref >60)
Glucose, Bld: 224 mg/dL — ABNORMAL HIGH (ref 70–99)
Potassium: 4.6 mmol/L (ref 3.5–5.1)
Sodium: 138 mmol/L (ref 135–145)
Total Bilirubin: 1.4 mg/dL — ABNORMAL HIGH (ref 0.3–1.2)
Total Protein: 6.4 g/dL — ABNORMAL LOW (ref 6.5–8.1)

## 2021-03-26 LAB — RESP PANEL BY RT-PCR (FLU A&B, COVID) ARPGX2
Influenza A by PCR: NEGATIVE
Influenza B by PCR: NEGATIVE
SARS Coronavirus 2 by RT PCR: NEGATIVE

## 2021-03-26 LAB — GLUCOSE, CAPILLARY
Glucose-Capillary: 157 mg/dL — ABNORMAL HIGH (ref 70–99)
Glucose-Capillary: 85 mg/dL (ref 70–99)
Glucose-Capillary: 95 mg/dL (ref 70–99)

## 2021-03-26 LAB — URINALYSIS, ROUTINE W REFLEX MICROSCOPIC
Bilirubin Urine: NEGATIVE
Glucose, UA: NEGATIVE mg/dL
Hgb urine dipstick: NEGATIVE
Ketones, ur: NEGATIVE mg/dL
Nitrite: NEGATIVE
Protein, ur: NEGATIVE mg/dL
Specific Gravity, Urine: 1.009 (ref 1.005–1.030)
WBC, UA: >50 WBC/hpf — ABNORMAL HIGH (ref 0–5)
pH: 5 (ref 5.0–8.0)

## 2021-03-26 LAB — BASIC METABOLIC PANEL
Anion gap: 12 (ref 5–15)
BUN: 67 mg/dL — ABNORMAL HIGH (ref 8–23)
CO2: 24 mmol/L (ref 22–32)
Calcium: 9.4 mg/dL (ref 8.9–10.3)
Chloride: 100 mmol/L (ref 98–111)
Creatinine, Ser: 2.2 mg/dL — ABNORMAL HIGH (ref 0.44–1.00)
GFR, Estimated: 24 mL/min — ABNORMAL LOW (ref >60)
Glucose, Bld: 244 mg/dL — ABNORMAL HIGH (ref 70–99)
Potassium: 4.6 mmol/L (ref 3.5–5.1)
Sodium: 136 mmol/L (ref 135–145)

## 2021-03-26 LAB — LACTIC ACID, PLASMA
Lactic Acid, Venous: 1.4 mmol/L (ref 0.5–1.9)
Lactic Acid, Venous: 0.9 mmol/L (ref 0.5–1.9)

## 2021-03-26 LAB — MAGNESIUM: Magnesium: 2.2 mg/dL (ref 1.7–2.4)

## 2021-03-26 MED ORDER — ONDANSETRON HCL 4 MG/2ML IJ SOLN
4.0000 mg | Freq: Once | INTRAMUSCULAR | Status: AC
  Administered 2021-03-26: 03:00:00 4 mg via INTRAVENOUS
  Filled 2021-03-26: qty 2

## 2021-03-26 MED ORDER — MOMETASONE FURO-FORMOTEROL FUM 200-5 MCG/ACT IN AERO: 2.0000 | INHALATION_SPRAY | Freq: Two times a day (BID) | RESPIRATORY_TRACT | Status: DC

## 2021-03-26 MED ORDER — ONDANSETRON HCL 4 MG/2ML IJ SOLN
4.0000 mg | Freq: Once | INTRAMUSCULAR | Status: AC
  Administered 2021-03-26: 19:00:00 4 mg via INTRAVENOUS
  Filled 2021-03-26: qty 2

## 2021-03-26 MED ORDER — COLCHICINE 0.6 MG PO TABS
0.6000 mg | ORAL_TABLET | Freq: Every day | ORAL | Status: DC
  Administered 2021-03-26 – 2021-03-27 (×2): 0.6 mg via ORAL
  Filled 2021-03-26 (×2): qty 1

## 2021-03-26 MED ORDER — ATORVASTATIN CALCIUM 40 MG PO TABS
40.0000 mg | ORAL_TABLET | Freq: Every day | ORAL | Status: DC
  Administered 2021-03-26 – 2021-03-27 (×2): 40 mg via ORAL
  Filled 2021-03-26 (×3): qty 1

## 2021-03-26 MED ORDER — AMLODIPINE BESYLATE 10 MG PO TABS
10.0000 mg | ORAL_TABLET | Freq: Every day | ORAL | Status: DC
  Administered 2021-03-26 – 2021-03-27 (×2): 10 mg via ORAL
  Filled 2021-03-26 (×2): qty 1

## 2021-03-26 MED ORDER — INSULIN GLARGINE-YFGN 100 UNIT/ML ~~LOC~~ SOLN
25.0000 [IU] | Freq: Every day | SUBCUTANEOUS | Status: DC
  Administered 2021-03-26: 22:00:00 25 [IU] via SUBCUTANEOUS
  Filled 2021-03-26 (×2): qty 0.25

## 2021-03-26 MED ORDER — HYDROMORPHONE HCL 1 MG/ML IJ SOLN
1.0000 mg | Freq: Once | INTRAMUSCULAR | Status: AC
  Administered 2021-03-26: 08:00:00 1 mg via INTRAVENOUS
  Filled 2021-03-26: qty 1

## 2021-03-26 MED ORDER — VITAMIN D 25 MCG (1000 UNIT) PO TABS
1000.0000 [IU] | ORAL_TABLET | Freq: Every day | ORAL | Status: DC
  Administered 2021-03-26 – 2021-03-27 (×2): 1000 [IU] via ORAL
  Filled 2021-03-26 (×2): qty 1

## 2021-03-26 MED ORDER — CARVEDILOL 25 MG PO TABS
25.0000 mg | ORAL_TABLET | Freq: Two times a day (BID) | ORAL | Status: DC
  Administered 2021-03-26 – 2021-03-27 (×3): 25 mg via ORAL
  Filled 2021-03-26 (×3): qty 1

## 2021-03-26 MED ORDER — SODIUM CHLORIDE 0.9 % IV SOLN
2.0000 g | INTRAVENOUS | Status: DC
  Administered 2021-03-26 – 2021-03-27 (×2): 2 g via INTRAVENOUS
  Filled 2021-03-26 (×2): qty 20

## 2021-03-26 MED ORDER — ACETAMINOPHEN 500 MG PO TABS
1000.0000 mg | ORAL_TABLET | Freq: Once | ORAL | Status: AC
  Administered 2021-03-26: 03:00:00 1000 mg via ORAL
  Filled 2021-03-26: qty 2

## 2021-03-26 MED ORDER — CHLORHEXIDINE GLUCONATE CLOTH 2 % EX PADS
6.0000 | MEDICATED_PAD | Freq: Every day | CUTANEOUS | Status: DC
  Administered 2021-03-26 – 2021-03-27 (×2): 6 via TOPICAL

## 2021-03-26 MED ORDER — ALBUTEROL SULFATE HFA 108 (90 BASE) MCG/ACT IN AERS: 2.0000 | INHALATION_SPRAY | Freq: Four times a day (QID) | RESPIRATORY_TRACT | Status: DC | PRN

## 2021-03-26 MED ORDER — HYDRALAZINE HCL 50 MG PO TABS
50.0000 mg | ORAL_TABLET | Freq: Three times a day (TID) | ORAL | Status: DC
  Administered 2021-03-26 – 2021-03-27 (×5): 50 mg via ORAL
  Filled 2021-03-26 (×5): qty 1

## 2021-03-26 MED ORDER — HYDROMORPHONE HCL 1 MG/ML IJ SOLN: 1.0000 mg | Freq: Once | INTRAMUSCULAR | Status: DC

## 2021-03-26 MED ORDER — LACTATED RINGERS IV SOLN
INTRAVENOUS | Status: AC
  Administered 2021-03-26: 05:00:00 100 mL/h via INTRAVENOUS

## 2021-03-26 MED ORDER — EZETIMIBE 10 MG PO TABS
10.0000 mg | ORAL_TABLET | Freq: Every day | ORAL | Status: DC
  Administered 2021-03-26 – 2021-03-27 (×2): 10 mg via ORAL
  Filled 2021-03-26 (×2): qty 1

## 2021-03-26 MED ORDER — ACETAMINOPHEN 325 MG PO TABS
650.0000 mg | ORAL_TABLET | Freq: Four times a day (QID) | ORAL | Status: DC | PRN
  Administered 2021-03-26: 21:00:00 650 mg via ORAL
  Filled 2021-03-26 (×2): qty 2

## 2021-03-26 MED ORDER — INSULIN ASPART 100 UNIT/ML IJ SOLN
0.0000 [IU] | Freq: Three times a day (TID) | INTRAMUSCULAR | Status: DC
  Administered 2021-03-26: 13:00:00 3 [IU] via SUBCUTANEOUS

## 2021-03-26 MED ORDER — POTASSIUM CHLORIDE CRYS ER 20 MEQ PO TBCR
20.0000 meq | EXTENDED_RELEASE_TABLET | ORAL | Status: DC
  Administered 2021-03-26 – 2021-03-27 (×2): 20 meq via ORAL
  Filled 2021-03-26: qty 1

## 2021-03-26 MED ORDER — FENTANYL CITRATE PF 50 MCG/ML IJ SOSY
50.0000 ug | PREFILLED_SYRINGE | Freq: Once | INTRAMUSCULAR | Status: DC
  Filled 2021-03-26: qty 1

## 2021-03-26 MED ORDER — LACTATED RINGERS IV BOLUS (SEPSIS)
1000.0000 mL | Freq: Once | INTRAVENOUS | Status: AC
  Administered 2021-03-26: 03:00:00 1000 mL via INTRAVENOUS

## 2021-03-26 MED ORDER — SENNOSIDES-DOCUSATE SODIUM 8.6-50 MG PO TABS: 1.0000 | ORAL_TABLET | Freq: Two times a day (BID) | ORAL | Status: DC | PRN

## 2021-03-26 MED ORDER — ONDANSETRON HCL 4 MG/2ML IJ SOLN: 4.0000 mg | Freq: Four times a day (QID) | INTRAMUSCULAR | Status: DC | PRN

## 2021-03-26 MED ORDER — HYDROMORPHONE HCL 1 MG/ML IJ SOLN
0.5000 mg | Freq: Once | INTRAMUSCULAR | Status: AC
  Administered 2021-03-26: 13:00:00 0.5 mg via INTRAVENOUS
  Filled 2021-03-26: qty 0.5

## 2021-03-26 MED ORDER — LORATADINE 10 MG PO TABS: 10.0000 mg | ORAL_TABLET | Freq: Every day | ORAL | Status: DC

## 2021-03-26 MED ORDER — ALBUTEROL SULFATE (2.5 MG/3ML) 0.083% IN NEBU: 2.5000 mg | INHALATION_SOLUTION | Freq: Four times a day (QID) | RESPIRATORY_TRACT | Status: DC | PRN

## 2021-03-26 MED ORDER — OXYCODONE HCL 5 MG PO TABS
5.0000 mg | ORAL_TABLET | ORAL | Status: DC | PRN
  Administered 2021-03-26 – 2021-03-27 (×2): 5 mg via ORAL
  Filled 2021-03-26 (×2): qty 1

## 2021-03-26 MED ORDER — GLUCERNA SHAKE PO LIQD
237.0000 mL | Freq: Two times a day (BID) | ORAL | Status: DC
Start: 2021-03-26 — End: 2021-03-28
  Administered 2021-03-26 – 2021-03-27 (×3): 237 mL via ORAL
  Filled 2021-03-26 (×4): qty 237

## 2021-03-26 MED ORDER — LACTATED RINGERS IV BOLUS
1000.0000 mL | Freq: Once | INTRAVENOUS | Status: AC
  Administered 2021-03-26: 03:00:00 1000 mL via INTRAVENOUS

## 2021-03-26 MED ORDER — LACTATED RINGERS IV SOLN: INTRAVENOUS | Status: DC

## 2021-03-26 MED ORDER — ACETAMINOPHEN 325 MG PO TABS
650.0000 mg | ORAL_TABLET | Freq: Once | ORAL | Status: AC | PRN
  Administered 2021-03-26: 01:00:00 650 mg via ORAL
  Filled 2021-03-26: qty 2

## 2021-03-26 MED ORDER — ACETAMINOPHEN 650 MG RE SUPP: 650.0000 mg | Freq: Four times a day (QID) | RECTAL | Status: DC | PRN

## 2021-03-26 NOTE — Progress Notes (Signed)
Carry over to day admitter . Admission for pyelonephritis in the setting of urinary retention, status postplacement of Foley catheter in the ED this evening.  On Rocephin. Inpatient to med-tele order placed. Adult mutli-morbid admission ordered set utilized.     Newton Pigg, DO Hospitalist

## 2021-03-26 NOTE — Progress Notes (Signed)
   03/26/21 2037  Assess: MEWS Score  Temp (!) 102.3 F (39.1 C)  BP 138/65  Pulse Rate 78  Resp 20  SpO2 98 %  Assess: MEWS Score  MEWS Temp 2  MEWS Systolic 0  MEWS Pulse 0  MEWS RR 0  MEWS LOC 0  MEWS Score 2  MEWS Score Color Yellow  Assess: if the MEWS score is Yellow or Red  Were vital signs taken at a resting state? Yes  Focused Assessment No change from prior assessment  Does the patient meet 2 or more of the SIRS criteria? No  MEWS guidelines implemented *See Row Information* Yes  Treat  MEWS Interventions Administered prn meds/treatments  Pain Scale 0-10  Pain Score 10  Pain Type Acute pain  Pain Location Leg  Pain Orientation Right  Pain Descriptors / Indicators Aching;Discomfort  Pain Frequency Constant  Pain Onset Gradual  Patients Stated Pain Goal 3  Pain Intervention(s) Medication (See eMAR)  Multiple Pain Sites No  Complains of Fever  Interventions Medication (see MAR)  Patients response to intervention Decreased  Take Vital Signs  Increase Vital Sign Frequency  Yellow: Q 2hr X 2 then Q 4hr X 2, if remains yellow, continue Q 4hrs  Escalate  MEWS: Escalate Yellow: discuss with charge nurse/RN and consider discussing with provider and RRT  Notify: Charge Nurse/RN  Name of Charge Nurse/RN Notified Brayton Caves RN  Date Charge Nurse/RN Notified 03/26/21  Time Charge Nurse/RN Notified 2040  Document  Patient Outcome Stabilized after interventions  Progress note created (see row info) Yes  Assess: SIRS CRITERIA  SIRS Temperature  1  SIRS Pulse 0  SIRS Respirations  0  SIRS WBC 0  SIRS Score Sum  1
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
## 2021-03-26 NOTE — ED Triage Notes (Signed)
Patient brought in by [facility] from [facility]. Patient is complaining of sciatica pain.
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
## 2021-03-27 DIAGNOSIS — N179 Acute kidney failure, unspecified: Secondary | ICD-10-CM | POA: Diagnosis not present

## 2021-03-27 DIAGNOSIS — A419 Sepsis, unspecified organism: Secondary | ICD-10-CM | POA: Diagnosis not present

## 2021-03-27 DIAGNOSIS — R6520 Severe sepsis without septic shock: Secondary | ICD-10-CM

## 2021-03-27 DIAGNOSIS — N1330 Unspecified hydronephrosis: Secondary | ICD-10-CM | POA: Diagnosis not present

## 2021-03-27 DIAGNOSIS — A4151 Sepsis due to Escherichia coli [E. coli]: Secondary | ICD-10-CM | POA: Diagnosis not present

## 2021-03-27 LAB — COMPREHENSIVE METABOLIC PANEL
ALT: 18 U/L (ref 0–44)
AST: 21 U/L (ref 15–41)
Albumin: 2.9 g/dL — ABNORMAL LOW (ref 3.5–5.0)
Alkaline Phosphatase: 98 U/L (ref 38–126)
Anion gap: 10 (ref 5–15)
BUN: 48 mg/dL — ABNORMAL HIGH (ref 8–23)
CO2: 26 mmol/L (ref 22–32)
Calcium: 9.2 mg/dL (ref 8.9–10.3)
Chloride: 106 mmol/L (ref 98–111)
Creatinine, Ser: 1.72 mg/dL — ABNORMAL HIGH (ref 0.44–1.00)
GFR, Estimated: 33 mL/min — ABNORMAL LOW (ref >60)
Glucose, Bld: 84 mg/dL (ref 70–99)
Potassium: 4.2 mmol/L (ref 3.5–5.1)
Sodium: 142 mmol/L (ref 135–145)
Total Bilirubin: 1.1 mg/dL (ref 0.3–1.2)
Total Protein: 6.2 g/dL — ABNORMAL LOW (ref 6.5–8.1)

## 2021-03-27 LAB — GLUCOSE, CAPILLARY
Glucose-Capillary: 78 mg/dL (ref 70–99)
Glucose-Capillary: 92 mg/dL (ref 70–99)
Glucose-Capillary: 77 mg/dL (ref 70–99)

## 2021-03-27 LAB — CBC WITH DIFFERENTIAL/PLATELET
Abs Immature Granulocytes: 0.07 10*3/uL (ref 0.00–0.07)
Basophils Absolute: 0 10*3/uL (ref 0.0–0.1)
Basophils Relative: 0 %
Eosinophils Absolute: 0 10*3/uL (ref 0.0–0.5)
Eosinophils Relative: 0 %
HCT: 33.9 % — ABNORMAL LOW (ref 36.0–46.0)
Hemoglobin: 10.8 g/dL — ABNORMAL LOW (ref 12.0–15.0)
Immature Granulocytes: 1 %
Lymphocytes Relative: 11 %
Lymphs Abs: 1 10*3/uL (ref 0.7–4.0)
MCH: 29.6 pg (ref 26.0–34.0)
MCHC: 31.9 g/dL (ref 30.0–36.0)
MCV: 92.9 fL (ref 80.0–100.0)
Monocytes Absolute: 1.1 10*3/uL — ABNORMAL HIGH (ref 0.1–1.0)
Monocytes Relative: 12 %
Neutro Abs: 6.6 10*3/uL (ref 1.7–7.7)
Neutrophils Relative %: 76 %
Platelets: 189 10*3/uL (ref 150–400)
RBC: 3.65 MIL/uL — ABNORMAL LOW (ref 3.87–5.11)
RDW: 15 % (ref 11.5–15.5)
WBC: 8.8 10*3/uL (ref 4.0–10.5)
nRBC: 0 % (ref 0.0–0.2)

## 2021-03-27 LAB — URINE CULTURE: Culture: 90000 — AB

## 2021-03-27 LAB — HEMOGLOBIN A1C
Hgb A1c MFr Bld: 9.2 % — ABNORMAL HIGH (ref 4.8–5.6)
Mean Plasma Glucose: 217.34 mg/dL

## 2021-03-27 MED ORDER — ACETAMINOPHEN 325 MG PO TABS
650.0000 mg | ORAL_TABLET | Freq: Four times a day (QID) | ORAL | Status: DC | PRN
Start: 2021-03-27 — End: 2021-08-05

## 2021-03-27 MED ORDER — SULFAMETHOXAZOLE-TRIMETHOPRIM 800-160 MG PO TABS: 1.0000 | ORAL_TABLET | Freq: Two times a day (BID) | ORAL | 0 refills | Status: AC

## 2021-03-27 MED ORDER — PIPERACILLIN-TAZOBACTAM 3.375 G IVPB
3.3750 g | Freq: Three times a day (TID) | INTRAVENOUS | Status: DC
  Filled 2021-03-27: qty 50

## 2021-03-27 MED ORDER — OXYCODONE HCL 5 MG PO TABS
5.0000 mg | ORAL_TABLET | ORAL | Status: DC | PRN
Start: 2021-03-27 — End: 2021-03-28
  Administered 2021-03-27 (×3): 10 mg via ORAL
  Administered 2021-03-27: 03:00:00 5 mg via ORAL
  Filled 2021-03-27: qty 1
  Filled 2021-03-27 (×3): qty 2

## 2021-03-27 MED ORDER — ERTAPENEM SODIUM 1 G IJ SOLR
1.0000 g | Freq: Once | INTRAMUSCULAR | Status: AC
  Administered 2021-03-27: 09:00:00 1000 mg via INTRAVENOUS
  Filled 2021-03-27: qty 1

## 2021-03-27 NOTE — Care Management CC44 (Signed)
Condition Code 44 Documentation Completed  Patient Details  Name: Shirley Chen MRN: [id number] Date of Birth: Sep 24, 1955   Condition Code 44 given:  Yes Patient signature on Condition Code 44 notice:  Yes Documentation of 2 MD's agreement:  Yes Code 44 added to claim:  Yes    Ida Rogue, LCSW 03/27/2021, 1:14 PM

## 2021-03-27 NOTE — Progress Notes (Signed)
Shirley Chen to be D/C'd to [facility] per MD order. Attempted report to [facility] x5 but no response. Phone keeps ringing. Call back number written on discharge packet.  Skin clean, dry and intact without evidence of skin break down, no evidence of skin tears noted.  IV catheter discontinued intact. Site without signs and symptoms of complications. Dressing and pressure applied.  Currently waiting on [facility].  Jon Gills  03/27/2021

## 2021-03-27 NOTE — Progress Notes (Signed)
Patient left via stretcher with [facility]. Attempted to call [facility] but no answer. Patient stable and PRN oxycodone given prior to discharge. AVS given to [facility] for receiving facility.

## 2021-03-27 NOTE — Discharge Summary (Signed)
Physician Discharge Summary  Maddelynn Moosman XBM:[id number] DOB: 27-Feb-1956 DOA: 03/26/2021  PCP: Marrian Salvage, FNP  Admit date: 03/26/2021 Discharge date: 03/27/2021  Admitted From: SNF  Disposition:  SNF   Recommendations for Outpatient Follow-up:  Please do a voiding trail in 5 days.  Bmet to be done on Friday and again on Monday please Stop Bactrim in 12/28 Holding Colchicine and Aldactone while on Bactrim     Discharge Condition:  stable   CODE STATUS:  DNR   Diet recommendation:  heart healthy, diabetic Consultations: none  Procedures/Studies: none   Discharge Diagnoses:  Principal Problem:   Sepsis ([place])- e coli pyelonephritis Active Problems:   Acute kidney injury ([place])  Stage 3a chronic kidney disease (CKD) ([place])   Urinary retention-   Bilateral hydronephrosis PAD (peripheral artery disease) ([place])   Hypertension associated with diabetes ([place])   Type 2 diabetes mellitus ([place])   Hyperlipidemia  Brief Summary: Shirley Chen is a 65 y.o. female who resided at [place] with medical history significant of osteoarthritis, right foot cellulitis, amputation of right great toe, left BKA, constipation, type II DM, CKD 3a, frequent headaches, hyperlipidemia, hypertension, former tobacco abuse, history of UTI, admitted and discharged for a week due to AKI in the setting of acute influenza A last month who is returning to the emergency department due to worsening of her chronic right sciatica pain radiating down to her RLE which seems to be out of proportion from her regular pain.   In the ED > temperature 103.2 F, WBC 14.9.  BUN 67 and Cr 2.20  Urinary retention noted and foley cath placed CT renal stone study> mild bilateral hydronephrosis and perinephric inflammatory stranding, right greater than left.  There is distention of the bladder noted.  These findings may be due to although obstruction.  There was superimposed perinephric inflammatory stranding that may  reflect a superimposed infectious or inflammatory process.   UA consistent with a UTI.  Admitted for sepsis due to a UTI  Hospital Course:  Sepsis due to pyelonephritis in setting of urinary retention - perinephric inflammatory stranding, right greater than left on CT - urine growing E coli, ESBL- have given a dose of Ertapenem and will dc today with Bactrim  - WBC has normalized - recommend a Bmet on Friday and again on Monday for f/u labs while on Bactrim - Hold aldactone and Colchicine while on Bactrim  Urinary retention- bilateral hydronephrosis on CT - foley in place - recommend voiding trial in 5 days  AKI- CKD 3a - likely due to urinary retention - CR 2.20 has improved to 1.72 - baseline CR ~ 1l.1- 1.2 - f/u BMet as recommended above  DM 2 -cont Lantus and Novolog    Discharge Exam: Vitals:   03/27/21 0026 03/27/21 0435  BP: 104/74 (!) 158/64  Pulse: 69 85  Resp: 20 20  Temp: 100.1 F (37.8 C) 98.8 F (37.1 C)  SpO2: 90% 90%   Vitals:   03/26/21 2153 03/26/21 2227 03/27/21 0026 03/27/21 0435  BP:  (!) 103/43 104/74 (!) 158/64  Pulse:  73 69 85  Resp:  _0 Temp: (!) 100.8 F (38.2 C) 100.2 F (37.9 C) 100.1 F (37.8 C) 98.8 F (37.1 C)  TempSrc: Oral Oral Oral Oral  SpO2:  96% 90% 90%  Weight:    84.9 kg  Height:        General: Pt is alert, awake, not in acute distress Cardiovascular: RRR, S1/S2 +, no rubs,  no gallops Respiratory: CTA bilaterally, no wheezing, no rhonchi Abdominal: Soft, NT, ND, bowel sounds + Extremities: no edema, no cyanosis   Discharge Instructions   Allergies as of 03/27/2021       Reactions   Losartan Nausea And Vomiting   Morphine And Related    Pt states " I just got really sick"        Medication List     STOP taking these medications    colchicine 0.6 MG tablet   fluticasone 50 MCG/ACT nasal spray Commonly known as: FLONASE   mometasone-formoterol 200-5 MCG/ACT Aero Commonly known as: DULERA    spironolactone 25 MG tablet Commonly known as: ALDACTONE       TAKE these medications    Accu-Chek Guide test strip Generic drug: glucose blood USE 1 STRIP TO CHECK BLOOD  GLUCOSE 3 TIMES DAILY   acetaminophen 325 MG tablet Commonly known as: TYLENOL Take 2 tablets (650 mg total) by mouth every 6 (six) hours as needed for mild pain (or Fever >/= 101).   albuterol 108 (90 Base) MCG/ACT inhaler Commonly known as: VENTOLIN HFA Inhale 2 puffs into the lungs every 6 (six) hours as needed for wheezing or shortness of breath.   amLODipine 10 MG tablet Commonly known as: NORVASC TAKE 1 TABLET BY MOUTH  DAILY   aspirin EC 81 MG tablet Take 81 mg by mouth every evening. Swallow whole.   atorvastatin 40 MG tablet Commonly known as: LIPITOR TAKE 1 TABLET BY MOUTH  DAILY   blood glucose meter kit and supplies Kit Dispense based on patient and insurance preference. Use up to four times daily as directed. (FOR ICD-9 250.00, 250.01).   calcium carbonate (dosed in mg elemental calcium) 1250 MG/5ML Susp Take 5 mLs (500 mg of elemental calcium total) by mouth every 6 (six) hours as needed for indigestion.   carvedilol 25 MG tablet Commonly known as: COREG TAKE 1 TABLET BY MOUTH  TWICE DAILY WITH A MEAL   Cholecalciferol 25 MCG (1000 UT) tablet Take 1,000 Units by mouth daily.   ezetimibe 10 MG tablet Commonly known as: ZETIA TAKE 1 TABLET BY MOUTH  DAILY   feeding supplement (GLUCERNA SHAKE) Liqd Take 237 mLs by mouth 2 (two) times daily between meals.   fluticasone-salmeterol 500-50 MCG/ACT Aepb Commonly known as: ADVAIR Inhale 2 puffs into the lungs daily.   furosemide 20 MG tablet Commonly known as: LASIX Take 20 mg by mouth See admin instructions. 20 mg daily on Tuesday's and Thursday's. (Alternates with 40 mg tablet) What changed: Another medication with the same name was changed. Make sure you understand how and when to take each.   furosemide 40 MG tablet Commonly  known as: LASIX Take 1 tablet (40 mg total) by mouth daily. What changed:  when to take this additional instructions   glucose monitoring kit monitoring kit 1 each by Does not apply route 4 (four) times daily - after meals and at bedtime. 1 month Diabetic Testing Supplies for QAC-QHS accuchecks. Any brand OK. Diagnosis E11.65   guaiFENesin-dextromethorphan 100-10 MG/5ML syrup Commonly known as: ROBITUSSIN DM Take 10 mLs by mouth every 12 (twelve) hours as needed for cough.   hydrALAZINE 50 MG tablet Commonly known as: APRESOLINE Take 1 tablet (50 mg total) by mouth 3 (three) times daily.   insulin aspart 100 UNIT/ML injection Commonly known as: novoLOG 0-15 Units, Subcutaneous, 3 times daily with meals CBG < 70: Implement Hypoglycemia measures CBG 70 - 120: 0 units CBG 121 -  150: 2 units CBG 151 - 200: 3 units CBG 201 - 250: 5 units CBG 251 - 300: 8 units CBG 301 - 350: 11 units CBG 351 - 400: 15 units CBG > 400: call MD   insulin glargine 100 UNIT/ML injection Commonly known as: LANTUS Inject 0.2 mLs (20 Units total) into the skin at bedtime. What changed: how much to take   insulin lispro 100 UNIT/ML injection Commonly known as: HUMALOG Inject 2-12 Units into the skin daily. Per sliding scale   loratadine 10 MG tablet Commonly known as: CLARITIN Take 1 tablet (10 mg total) by mouth daily.   montelukast 10 MG tablet Commonly known as: SINGULAIR Take 10 mg by mouth daily.   ondansetron 4 MG tablet Commonly known as: ZOFRAN Take 4 mg by mouth every 6 (six) hours as needed for nausea or vomiting.   oxyCODONE-acetaminophen 5-325 MG tablet Commonly known as: PERCOCET/ROXICET Take 1 tablet by mouth every 8 (eight) hours as needed for severe pain.   potassium & sodium phosphates 280-160-250 MG Pack Commonly known as: PHOS-NAK Take 1 packet by mouth in the morning, at noon, and at bedtime.   Potassium Chloride ER 20 MEQ Tbcr Take 1 tablet by mouth daily. What  changed:  how much to take when to take this additional instructions   pregabalin 75 MG capsule Commonly known as: LYRICA Take 1 capsule (75 mg total) by mouth 2 (two) times daily. What changed: when to take this   senna-docusate 8.6-50 MG tablet Commonly known as: Senokot-S Take 1 tablet by mouth 2 (two) times daily as needed for mild constipation. What changed:  how much to take when to take this   sulfamethoxazole-trimethoprim 800-160 MG tablet Commonly known as: BACTRIM DS Take 1 tablet by mouth 2 (two) times daily for 7 days.   vitamin C 250 MG tablet Commonly known as: ASCORBIC ACID Take 250 mg by mouth daily.        Contact information for after-discharge care     Destination     [facility] .   Service: Skilled Nursing Contact information: [place] [phone]                    Allergies  Allergen Reactions   Losartan Nausea And Vomiting   Morphine And Related     Pt states " I just got really sick"      CT Renal Stone Study  Result Date: 03/26/2021 CLINICAL DATA:  Leukocytosis, pyuria, flank pain EXAM: CT ABDOMEN AND PELVIS WITHOUT CONTRAST TECHNIQUE: Multidetector CT imaging of the abdomen and pelvis was performed following the standard protocol without IV contrast. COMPARISON:  None. FINDINGS: Lower chest: Scattered micro nodular infiltrate within the lung bases bilaterally, more prevalent within the left lower lobe, may reflect mild infectious infiltrate or post inflammatory scarring. No pleural effusion. Moderate coronary artery calcification. Hepatobiliary: Cholelithiasis without pericholecystic inflammatory change noted. Liver unremarkable. No intra or extrahepatic biliary ductal dilation. Pancreas: Unremarkable Spleen: Unremarkable Adrenals/Urinary Tract: The adrenal glands are unremarkable. The kidneys are normal in size and position. Mild bilateral hydronephrosis present, right greater  than left, and there is moderate right and mild left perinephric inflammatory stranding noted. No intrarenal or ureteral calculi are seen. The bladder is distended but is otherwise unremarkable. Stomach/Bowel: Stomach is within normal limits. Appendix appears normal. No evidence of bowel wall thickening, distention, or inflammatory changes. Vascular/Lymphatic: Aortic atherosclerosis. Right common femoral bypass graft is partially visualized. No enlarged  abdominal or pelvic lymph nodes. Reproductive: Uterus and bilateral adnexa are unremarkable. Other: No abdominal wall hernia or abnormality. No abdominopelvic ascites. Musculoskeletal: No acute bone abnormality. No lytic or blastic bone lesion. IMPRESSION: Mild bilateral hydronephrosis and perinephric inflammatory stranding, right greater than left. Distention of the bladder noted. Together, the findings may reflect changes of bladder outlet obstruction. Superimposed perinephric inflammatory stranding may reflect a superimposed infectious or inflammatory process and correlation with urinalysis and urine culture may be helpful. Cholelithiasis. Mild bibasilar pulmonary infiltrate, possibly infectious or inflammatory or reflective of post inflammatory scarring. Aortic Atherosclerosis (ICD10-I70.0). Electronically Signed   By: Fidela Salisbury M.D.   On: 03/26/2021 03:26     The results of significant diagnostics from this hospitalization (including imaging, microbiology, ancillary and laboratory) are listed below for reference.     Microbiology: Recent Results (from the past 240 hour(s))  Urine Culture     Status: Abnormal   Collection Time: 03/26/21 12:37 AM   Specimen: Urine, Catheterized  Result Value Ref Range Status   Specimen Description   Final    URINE, CATHETERIZED Performed at [facility] [address], [place], [place] [address]    Special Requests   Final    NONE Performed at [facility], [place]  [address], [place], [place] [address]    Culture (A)  Final    90,000 COLONIES/mL ESCHERICHIA COLI Confirmed Extended Spectrum Beta-Lactamase Producer (ESBL).  In bloodstream infections from ESBL organisms, carbapenems are preferred over piperacillin/tazobactam. They are shown to have a lower risk of mortality.    Report Status 03/27/2021 FINAL  Final   Organism ID, Bacteria ESCHERICHIA COLI (A)  Final      Susceptibility   Escherichia coli - MIC*    AMPICILLIN >=32 RESISTANT Resistant     CEFAZOLIN >=64 RESISTANT Resistant     CEFEPIME >=32 RESISTANT Resistant     CEFTRIAXONE >=64 RESISTANT Resistant     CIPROFLOXACIN >=4 RESISTANT Resistant     GENTAMICIN <=1 SENSITIVE Sensitive     IMIPENEM <=0.25 SENSITIVE Sensitive     NITROFURANTOIN 32 SENSITIVE Sensitive     TRIMETH/SULFA <=20 SENSITIVE Sensitive     AMPICILLIN/SULBACTAM >=32 RESISTANT Resistant     PIP/TAZO <=4 SENSITIVE Sensitive     * 90,000 COLONIES/mL ESCHERICHIA COLI  Blood culture (routine x 2)     Status: None (Preliminary result)   Collection Time: 03/26/21 12:37 AM   Specimen: BLOOD  Result Value Ref Range Status   Specimen Description   Final    BLOOD RIGHT ANTECUBITAL Performed at [facility] [address], [place], [place] [address]    Special Requests   Final    BOTTLES DRAWN AEROBIC AND ANAEROBIC Blood Culture adequate volume Performed at [facility] [address] [place], [place] [address]    Culture   Final    NO GROWTH 1 DAY Performed at [facility], [place] [address], [place], [place] [address]    Report Status PENDING  Incomplete  Resp Panel by RT-PCR (Flu A&B, Covid) Urine, Clean Catch     Status: None   Collection Time: 03/26/21 12:44 AM   Specimen: Urine, Clean Catch; Nasopharyngeal(NP) swabs in vial transport medium  Result Value Ref Range Status   SARS Coronavirus 2 by RT PCR NEGATIVE NEGATIVE Final    Comment: (NOTE) SARS-CoV-2 target nucleic  acids are NOT DETECTED.  The SARS-CoV-2 RNA is generally detectable in upper respiratory specimens during the  acute phase of infection. The lowest concentration of SARS-CoV-2 viral copies this assay can detect is 138 copies/mL. A negative result does not preclude SARS-Cov-2 infection and should not be used as the sole basis for treatment or other patient management decisions. A negative result may occur with  improper specimen collection/handling, submission of specimen other than nasopharyngeal swab, presence of viral mutation(s) within the areas targeted by this assay, and inadequate number of viral copies(<138 copies/mL). A negative result must be combined with clinical observations, patient history, and epidemiological information. The expected result is Negative.  Fact Sheet for Patients:  EntrepreneurPulse.com.au  Fact Sheet for Healthcare Providers:  IncredibleEmployment.be  This test is no t yet approved or cleared by the [place] FDA and  has been authorized for detection and/or diagnosis of SARS-CoV-2 by FDA under an Emergency Use Authorization (EUA). This EUA will remain  in effect (meaning this test can be used) for the duration of the COVID-19 declaration under Section 564(b)(1) of the Act, 21 U.S.C.section 360bbb-3(b)(1), unless the authorization is terminated  or revoked sooner.       Influenza A by PCR NEGATIVE NEGATIVE Final   Influenza B by PCR NEGATIVE NEGATIVE Final    Comment: (NOTE) The Xpert Xpress SARS-CoV-2/FLU/RSV plus assay is intended as an aid in the diagnosis of influenza from Nasopharyngeal swab specimens and should not be used as a sole basis for treatment. Nasal washings and aspirates are unacceptable for Xpert Xpress SARS-CoV-2/FLU/RSV testing.  Fact Sheet for Patients: EntrepreneurPulse.com.au  Fact Sheet for Healthcare Providers: IncredibleEmployment.be  This  test is not yet approved or cleared by the [place] FDA and has been authorized for detection and/or diagnosis of SARS-CoV-2 by FDA under an Emergency Use Authorization (EUA). This EUA will remain in effect (meaning this test can be used) for the duration of the COVID-19 declaration under Section 564(b)(1) of the Act, 21 U.S.C. section 360bbb-3(b)(1), unless the authorization is terminated or revoked.  Performed at [facility], [place] [address] [place], [place] [address]   Blood culture (routine x 2)     Status: None (Preliminary result)   Collection Time: 03/26/21  6:09 AM   Specimen: BLOOD  Result Value Ref Range Status   Specimen Description   Final    BLOOD LEFT ANTECUBITAL Performed at [facility] [address] [place], [place] [address]    Special Requests   Final    BOTTLES DRAWN AEROBIC ONLY Blood Culture adequate volume Performed at [facility] [address] [place], [place] [address]    Culture   Final    NO GROWTH 1 DAY Performed at [facility], [place] [address], [place], [place] [address]    Report Status PENDING  Incomplete     Labs: BNP (last 3 results) Recent Labs    08/02/20 0559 08/05/20 0312 08/07/20 0236  BNP 440.3* 501.4* 633.3*   Basic Metabolic Panel: Recent Labs  Lab 03/26/21 0037 03/26/21 0433 03/27/21 0536  NA 136 138 142  K 4.6 4.6 4.2  CL 100 105 106  CO2 _0 GLUCOSE 244* 224* 84  BUN 67* 65* 48*  CREATININE 2.20* 1.89* 1.72*  CALCIUM 9.4 8.8* 9.2  MG  --  2.2  --    Liver Function Tests: Recent Labs  Lab 03/26/21 0433 03/27/21 0536  AST 15 21  ALT 18 18  ALKPHOS 108 98  BILITOT 1.4* 1.1  PROT 6.4* 6.2*  ALBUMIN 3.2* 2.9*  No results for input(s): LIPASE, AMYLASE in the last 168 hours. No results for input(s): AMMONIA in the last 168 hours. CBC: Recent Labs  Lab 03/26/21 0037 03/26/21 0433 03/27/21 0536  WBC 14.9* 12.3* 8.8  NEUTROABS   --  10.0* 6.6  HGB 11.8* 10.3* 10.8*  HCT 36.3 31.8* 33.9*  MCV 92.1 92.4 92.9  PLT 246 224 189   Cardiac Enzymes: No results for input(s): CKTOTAL, CKMB, CKMBINDEX, TROPONINI in the last 168 hours. BNP: Invalid input(s): POCBNP CBG: Recent Labs  Lab 03/26/21 1143 03/26/21 1648 03/26/21 2150 03/27/21 0759 03/27/21 1222  GLUCAP 157* 85 95 78 92   D-Dimer No results for input(s): DDIMER in the last 72 hours. Hgb A1c Recent Labs    03/27/21 0536  HGBA1C 9.2*   Lipid Profile No results for input(s): CHOL, HDL, LDLCALC, TRIG, CHOLHDL, LDLDIRECT in the last 72 hours. Thyroid function studies No results for input(s): TSH, T4TOTAL, T3FREE, THYROIDAB in the last 72 hours.  Invalid input(s): FREET3 Anemia work up No results for input(s): VITAMINB12, FOLATE, FERRITIN, TIBC, IRON, RETICCTPCT in the last 72 hours. Urinalysis    Component Value Date/Time   COLORURINE YELLOW 03/26/2021 0037   APPEARANCEUR HAZY (A) 03/26/2021 0037   LABSPEC 1.009 03/26/2021 0037   PHURINE 5.0 03/26/2021 0037   GLUCOSEU NEGATIVE 03/26/2021 0037   HGBUR NEGATIVE 03/26/2021 0037   BILIRUBINUR NEGATIVE 03/26/2021 0037   KETONESUR NEGATIVE 03/26/2021 0037   PROTEINUR NEGATIVE 03/26/2021 0037   UROBILINOGEN 0.2 11/29/2014 1231   NITRITE NEGATIVE 03/26/2021 0037   LEUKOCYTESUR MODERATE (A) 03/26/2021 0037   Sepsis Labs Invalid input(s): PROCALCITONIN,  WBC,  LACTICIDVEN Microbiology Recent Results (from the past 240 hour(s))  Urine Culture     Status: Abnormal   Collection Time: 03/26/21 12:37 AM   Specimen: Urine, Catheterized  Result Value Ref Range Status   Specimen Description   Final    URINE, CATHETERIZED Performed at [facility], [place] [address] [place], [place] [address]    Special Requests   Final    NONE Performed at [facility], [place] [address] [place], [place] [address]    Culture (A)  Final    90,000 COLONIES/mL ESCHERICHIA  COLI Confirmed Extended Spectrum Beta-Lactamase Producer (ESBL).  In bloodstream infections from ESBL organisms, carbapenems are preferred over piperacillin/tazobactam. They are shown to have a lower risk of mortality.    Report Status 03/27/2021 FINAL  Final   Organism ID, Bacteria ESCHERICHIA COLI (A)  Final      Susceptibility   Escherichia coli - MIC*    AMPICILLIN >=32 RESISTANT Resistant     CEFAZOLIN >=64 RESISTANT Resistant     CEFEPIME >=32 RESISTANT Resistant     CEFTRIAXONE >=64 RESISTANT Resistant     CIPROFLOXACIN >=4 RESISTANT Resistant     GENTAMICIN <=1 SENSITIVE Sensitive     IMIPENEM <=0.25 SENSITIVE Sensitive     NITROFURANTOIN 32 SENSITIVE Sensitive     TRIMETH/SULFA <=20 SENSITIVE Sensitive     AMPICILLIN/SULBACTAM >=32 RESISTANT Resistant     PIP/TAZO <=4 SENSITIVE Sensitive     * 90,000 COLONIES/mL ESCHERICHIA COLI  Blood culture (routine x 2)     Status: None (Preliminary result)   Collection Time: 03/26/21 12:37 AM   Specimen: BLOOD  Result Value Ref Range Status   Specimen Description   Final    BLOOD RIGHT ANTECUBITAL Performed at [facility] [address], [place], [place] [address]    Special Requests  Final    BOTTLES DRAWN AEROBIC AND ANAEROBIC Blood Culture adequate volume Performed at [facility] [address] [place], [place] [address]    Culture   Final    NO GROWTH 1 DAY Performed at [facility], [place] [address], [place], [place] [address]    Report Status PENDING  Incomplete  Resp Panel by RT-PCR (Flu A&B, Covid) Urine, Clean Catch     Status: None   Collection Time: 03/26/21 12:44 AM   Specimen: Urine, Clean Catch; Nasopharyngeal(NP) swabs in vial transport medium  Result Value Ref Range Status   SARS Coronavirus 2 by RT PCR NEGATIVE NEGATIVE Final    Comment: (NOTE) SARS-CoV-2 target nucleic acids are NOT DETECTED.  The SARS-CoV-2 RNA is generally detectable in upper  respiratory specimens during the acute phase of infection. The lowest concentration of SARS-CoV-2 viral copies this assay can detect is 138 copies/mL. A negative result does not preclude SARS-Cov-2 infection and should not be used as the sole basis for treatment or other patient management decisions. A negative result may occur with  improper specimen collection/handling, submission of specimen other than nasopharyngeal swab, presence of viral mutation(s) within the areas targeted by this assay, and inadequate number of viral copies(<138 copies/mL). A negative result must be combined with clinical observations, patient history, and epidemiological information. The expected result is Negative.  Fact Sheet for Patients:  EntrepreneurPulse.com.au  Fact Sheet for Healthcare Providers:  IncredibleEmployment.be  This test is no t yet approved or cleared by the [place] FDA and  has been authorized for detection and/or diagnosis of SARS-CoV-2 by FDA under an Emergency Use Authorization (EUA). This EUA will remain  in effect (meaning this test can be used) for the duration of the COVID-19 declaration under Section 564(b)(1) of the Act, 21 U.S.C.section 360bbb-3(b)(1), unless the authorization is terminated  or revoked sooner.       Influenza A by PCR NEGATIVE NEGATIVE Final   Influenza B by PCR NEGATIVE NEGATIVE Final    Comment: (NOTE) The Xpert Xpress SARS-CoV-2/FLU/RSV plus assay is intended as an aid in the diagnosis of influenza from Nasopharyngeal swab specimens and should not be used as a sole basis for treatment. Nasal washings and aspirates are unacceptable for Xpert Xpress SARS-CoV-2/FLU/RSV testing.  Fact Sheet for Patients: EntrepreneurPulse.com.au  Fact Sheet for Healthcare Providers: IncredibleEmployment.be  This test is not yet approved or cleared by the [place] FDA and has been  authorized for detection and/or diagnosis of SARS-CoV-2 by FDA under an Emergency Use Authorization (EUA). This EUA will remain in effect (meaning this test can be used) for the duration of the COVID-19 declaration under Section 564(b)(1) of the Act, 21 U.S.C. section 360bbb-3(b)(1), unless the authorization is terminated or revoked.  Performed at [facility], [place] [address], [place], [place] [address]   Blood culture (routine x 2)     Status: None (Preliminary result)   Collection Time: 03/26/21  6:09 AM   Specimen: BLOOD  Result Value Ref Range Status   Specimen Description   Final    BLOOD LEFT ANTECUBITAL Performed at [facility] [address], [place], [place] [address]    Special Requests   Final    BOTTLES DRAWN AEROBIC ONLY Blood Culture adequate volume Performed at [facility] [address], [place], [place] [address]    Culture   Final    NO GROWTH 1 DAY Performed at [facility], [address] [place], [place] [address]    Report Status PENDING  Incomplete     Time coordinating discharge in minutes: 65  SIGNED:   Debbe Odea, MD  [facility] 03/27/2021, 12:57 PM

## 2021-03-27 NOTE — Care Management Obs Status (Signed)
MEDICARE OBSERVATION STATUS NOTIFICATION   Patient Details  Name: Shirley Chen MRN: [id number] Date of Birth: [birth year]   Medicare Observation Status Notification Given:  Yes    Ida Rogue, LCSW 03/27/2021, 1:14 PM

## 2021-03-27 NOTE — TOC Transition Note (Signed)
Transition of Care [facility]) - CM/SW Discharge Note   Patient Details  Name: Shirley Chen MRN: [id number] Date of Birth: 06-28-1955  Transition of Care [facility]) CM/SW Contact:  Ida Rogue, LCSW Phone Number: 03/27/2021, 1:31 PM   Clinical Narrative:   Patient who is stable for d/c will return to [facility] today.  Per RN request, went to see patient who was tearful when conveying that she is afraid to go back because she does not get pain medication when she needs it, and that she is unable to go stay with her son-in-law and granddaughter until mid January 'because they are going on a cruise."  She requested transfer to a different facility.  I let her know that was not possible, but told her I would reach out to Luis Llorons Torres at [facility] to pass on her her concern.  I spoke with [facility] about this.  PTAR arranged.  Nursing, please call report to [phone]. TOC sign off.     Final next level of care: Skilled Nursing Facility Barriers to Discharge: No Barriers Identified   Patient Goals and CMS Choice        Discharge Placement                       Discharge Plan and Services                                     Social Determinants of Health (SDOH) Interventions     Readmission Risk Interventions No flowsheet data found.     Marland Kitchen

## 2021-03-27 NOTE — NC FL2 (Signed)
[facility] [place] LEVEL OF CARE SCREENING TOOL     IDENTIFICATION  Patient Name: Shirley Chen Birthdate: 30-May-1955 Sex: female Admission Date (Current Location): 03/26/2021  [facility] and [place] Number:  Herbalist and Address:  [facility],  [place], [place]      Provider Number: [id number]  Attending Physician Name and Address:  Debbe Odea, MD  Relative Name and Phone Number:  Vincent Peyer) (Relative)   [phone]    Current Level of Care: Hospital Recommended Level of Care: [place] Prior Approval Number:    Date Approved/Denied:   PASRR Number:    Discharge Plan: SNF [facility])    Current Diagnoses: Patient Active Problem List   Diagnosis Date Noted   Pyelonephritis 03/26/2021   Sepsis secondary to UTI ([place]) 03/26/2021   Bilateral hydronephrosis 03/26/2021   Hyperlipidemia    Stage 3a chronic kidney disease (CKD) ([place])    Influenza A 02/14/2021   AKI (acute kidney injury) ([place]) 02/14/2021   Acute kidney injury ([place]) 02/13/2021   Weakness 02/13/2021   Bacteremia due to Gram-negative bacteria 09/28/2020   COVID-19 virus infection 09/26/2020   Leucocytosis 09/25/2020   Severe sepsis ([place]) 09/25/2020   Necrotizing fasciitis ([place])    Acute respiratory failure with hypoxia ([place]) 08/02/2020   Acute renal failure ([place]) 08/02/2020   Status post amputation of great toe, right ([place]) 03/11/2019   Hyperlipidemia associated with type 2 diabetes mellitus ([place]) 06/16/2017   Mononeuropathy due to underlying disease 02/12/2017   Diabetic foot infection ([place])    Type 2 diabetes mellitus ([place]) 11/27/2014   PAD (peripheral artery disease) ([place]) 11/26/2014   Hypertension associated with diabetes ([place]) 11/26/2014   Diabetes mellitus with peripheral vascular disease ([place]) 11/26/2014   Atherosclerosis of native arteries of the extremities with ulceration ([place]) 10/27/2014   Tobacco abuse 10/20/2014     Orientation RESPIRATION BLADDER Height & Weight     Self, Time, Situation, Place  Normal Indwelling catheter Weight: 84.9 kg Height:  5\' 3"  (160 cm)  BEHAVIORAL SYMPTOMS/MOOD NEUROLOGICAL BOWEL NUTRITION STATUS      Continent Diet (see d/c summary)  AMBULATORY STATUS COMMUNICATION OF NEEDS Skin   Extensive Assist Verbally Normal                       Personal Care Assistance Level of Assistance  Bathing, Feeding, Dressing Bathing Assistance: Maximum assistance Feeding assistance: Independent Dressing Assistance: Limited assistance     Functional Limitations Info  Sight, Hearing, Speech Sight Info: Adequate Hearing Info: Adequate Speech Info: Adequate    SPECIAL CARE FACTORS FREQUENCY                       Contractures      Additional Factors Info  Code Status, Allergies Code Status Info: DNR Allergies Info: Losartan, morphine           Current Medications (03/27/2021):  This is the current hospital active medication list Current Facility-Administered Medications  Medication Dose Route Frequency Provider Last Rate Last Admin   acetaminophen (TYLENOL) tablet 650 mg  650 mg Oral Q6H PRN Howerter, Justin B, DO   650 mg at 03/26/21 2042   Or   acetaminophen (TYLENOL) suppository 650 mg  650 mg Rectal Q6H PRN Howerter, Justin B, DO       albuterol (PROVENTIL) (2.5 MG/3ML) 0.083% nebulizer solution 2.5 mg  2.5 mg Nebulization Q6H PRN Dimple Nanas, [facility]  amLODipine (NORVASC) tablet 10 mg  10 mg Oral Daily Bobette Mo, MD   10 mg at 03/27/21 0810   atorvastatin (LIPITOR) tablet 40 mg  40 mg Oral Daily Bobette Mo, MD   40 mg at 03/27/21 0810   carvedilol (COREG) tablet 25 mg  25 mg Oral BID WC Bobette Mo, MD   25 mg at 03/27/21 2831   Chlorhexidine Gluconate Cloth 2 % PADS 6 each  6 each Topical Daily Bobette Mo, MD   6 each at 03/27/21 1003   cholecalciferol (VITAMIN D3) tablet 1,000 Units  1,000 Units Oral Daily  Bobette Mo, MD   1,000 Units at 03/27/21 5176   colchicine tablet 0.6 mg  0.6 mg Oral Daily Bobette Mo, MD   0.6 mg at 03/27/21 1607   ezetimibe (ZETIA) tablet 10 mg  10 mg Oral Daily Bobette Mo, MD   10 mg at 03/27/21 0851   feeding supplement (GLUCERNA SHAKE) (GLUCERNA SHAKE) liquid 237 mL  237 mL Oral BID BM Bobette Mo, MD   237 mL at 03/26/21 1325   fentaNYL (SUBLIMAZE) injection 50 mcg  50 mcg Intravenous Once Gwyneth Sprout, MD       hydrALAZINE (APRESOLINE) tablet 50 mg  50 mg Oral TID Bobette Mo, MD   50 mg at 03/27/21 0810   insulin aspart (novoLOG) injection 0-15 Units  0-15 Units Subcutaneous TID WC Bobette Mo, MD   3 Units at 03/26/21 1252   insulin glargine-yfgn [facility]) injection 25 Units  25 Units Subcutaneous QHS Bobette Mo, MD   25 Units at 03/26/21 2217   ondansetron [facility]) injection 4 mg  4 mg Intravenous Q6H PRN Howerter, Justin B, DO       oxyCODONE (Oxy IR/ROXICODONE) immediate release tablet 5-10 mg  5-10 mg Oral Q4H PRN Luiz Iron, NP   10 mg at 03/27/21 0610   potassium chloride SA (KLOR-CON M) CR tablet 20 mEq  20 mEq Oral Once per day on Sun Mon Wed Fri Sat Bobette Mo, MD   20 mEq at 03/26/21 1327   senna-docusate (Senokot-S) tablet 1 tablet  1 tablet Oral BID PRN Bobette Mo, MD         Discharge Medications: Please see discharge summary for a list of discharge medications.  Relevant Imaging Results:  Relevant Lab Results:   Additional Information [id number]; Pt is UNVACCINATED for COVID  Ida Rogue, LCSW

## 2021-03-31 LAB — CULTURE, BLOOD (ROUTINE X 2)
Culture: NO GROWTH
Culture: NO GROWTH
Special Requests: ADEQUATE
Special Requests: ADEQUATE

## 2021-05-13 ENCOUNTER — Telehealth: Payer: Self-pay | Admitting: Family

## 2021-05-13 NOTE — Telephone Encounter (Signed)
[facility] called to state pt requested an appointment on 2/8.

## 2021-05-14 ENCOUNTER — Other Ambulatory Visit: Payer: Self-pay

## 2021-05-14 ENCOUNTER — Ambulatory Visit (INDEPENDENT_AMBULATORY_CARE_PROVIDER_SITE_OTHER): Payer: 59 | Admitting: Physician Assistant

## 2021-05-14 ENCOUNTER — Encounter: Payer: Self-pay | Admitting: Physician Assistant

## 2021-05-14 VITALS — BP 124/60 | HR 67 | Temp 98.0°F | Ht 63.0 in | Wt 207.0 lb

## 2021-05-14 DIAGNOSIS — E1169 Type 2 diabetes mellitus with other specified complication: Secondary | ICD-10-CM

## 2021-05-14 DIAGNOSIS — Z23 Encounter for immunization: Secondary | ICD-10-CM

## 2021-05-14 DIAGNOSIS — Z89411 Acquired absence of right great toe: Secondary | ICD-10-CM

## 2021-05-14 DIAGNOSIS — Z89512 Acquired absence of left leg below knee: Secondary | ICD-10-CM

## 2021-05-14 DIAGNOSIS — E1159 Type 2 diabetes mellitus with other circulatory complications: Secondary | ICD-10-CM

## 2021-05-14 DIAGNOSIS — E785 Hyperlipidemia, unspecified: Secondary | ICD-10-CM

## 2021-05-14 DIAGNOSIS — I152 Hypertension secondary to endocrine disorders: Secondary | ICD-10-CM

## 2021-05-14 DIAGNOSIS — E1151 Type 2 diabetes mellitus with diabetic peripheral angiopathy without gangrene: Secondary | ICD-10-CM

## 2021-05-14 NOTE — Telephone Encounter (Signed)
Noted  
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
## 2021-05-15 ENCOUNTER — Telehealth: Payer: Self-pay | Admitting: Physician Assistant

## 2021-05-15 NOTE — Telephone Encounter (Signed)
Shirley Chen [id number]) [facility]. Patient has decided to refuse all HH services. Patient feels like she does not need services and will not be seen by their company

## 2021-05-16 NOTE — Telephone Encounter (Signed)
Noted, thank you

## 2021-05-21 ENCOUNTER — Telehealth: Payer: Self-pay | Admitting: Physician Assistant

## 2021-05-21 ENCOUNTER — Other Ambulatory Visit: Payer: Self-pay

## 2021-05-21 NOTE — Telephone Encounter (Signed)
Pt called in and stated 5 medications are needing approval. 2 of them I cannot find listed in her chart. Phone # to call [phone]   REF#: [id number]  MEDICATIONS:pregabalin (LYRICA) 75 MG capsule  Potassium Chloride ER 20 MEQ TBCR  furosemide (LASIX) 40 MG tablet   Trimethoprim  Blood Glucose Kit

## 2021-05-21 NOTE — Telephone Encounter (Signed)
Patient Name: Shirley Chen [facility] Gender: Female DOB: 07/15/1955 Age: 66 Y 8 M Return Phone Number: [phone] (Primary) Address: City/ State/ Zip: [place]  [address] Client [facility] - Human resources officer [facility] [employer] Type Call Who Is Calling Patient / Member / Family / Caregiver Call Type Triage / Clinical Relationship To Patient Self Return Phone Number [phone] (Primary) Chief Complaint Medication Question (non symptomatic) Reason for Call Medication Question / Request Initial Comment Caller states her insurance company needs approval for her all 5 of her medications. Dr.Alissa Allward (not listed, but verified location). Translation No Nurse Assessment Nurse: Oretha Ellis, RN, Will Date/Time Lamount Cohen Time): 05/20/2021 9:59:42 PM Confirm and document reason for call. If symptomatic, describe symptoms. ---Caller reports that she was advised by pharmacy that prior authorization is needed for filling her prescriptions. Caller advised to contact prescribing physician for authorization. Back pain with hx of sciatica. Does the patient have any new or worsening symptoms? ---Yes Will a triage be completed? ---Yes Related visit to physician within the last 2 weeks? ---No Does the PT have any chronic conditions? (i.e. diabetes, asthma, this includes High risk factors for pregnancy, etc.) ---Yes Is this a behavioral health or substance abuse call? ---No Guidelines Guideline Title Affirmed Question Affirmed Notes Nurse Date/Time (Eastern Time) Back Pain Numbness in a leg or foot (i.e., loss of sensation) Oretha Ellis, RN, Will 05/20/2021 10:03:06 PM Disp. Time Lamount Cohen Time) Disposition Final User 05/20/2021 9:12:31 PM Send To Nurse Jeanella Cara, RN, Greg 05/20/2021 9:46:47 PM Send To RN Personal Oretha Ellis, RN, Will 05/20/2021 10:08:50 PM See PCP within 24 Hours Yes Oretha Ellis, RN, Will Caller  Disagree/Comply Comply Caller Understands Yes PreDisposition Did not know what to do Care Advice Given Per Guideline SEE PCP WITHIN 24 HOURS: * IF OFFICE WILL BE OPEN: You need to be examined within the next 24 hours. Call your doctor (or NP/PA) when the office opens and make an appointment. CALL BACK IF: * You become worse CARE ADVICE given per Back Pain (Adult) guideline. Comments User: Lamount Cranker, RN Date/Time Lamount Cohen Time): 05/20/2021 9:46:22 PM Number not in service. Number check requested from lead patient coordinator. User: Ian Bushman Date/Time Lamount Cohen Time): 05/20/2021 9:54:19 PM The correct phone number for the patient is: [phone] User: Lamount Cranker, RN Date/Time Lamount Cohen Time): 05/20/2021 9:58:52 PM Correct contact number for caller is [phone] User: Lamount Cranker, RN Date/Time Lamount Cohen Time): 05/20/2021 10:03:35 PM blood sugar 157. User: Lamount Cranker, RN Date/Time Lamount Cohen Time): 05/20/2021 10:11:27 PM Caller advised to contact office when open tomorrow regarding medication regiment and let her provider know that she has been prescribed new medications and is unable to get the prescriptions filled due to authorization issue. Alternative suggestion offered is that she could contact prescribing provider at nursing home. Understanding of instructions verbalized by caller. Referrals REFERRED TO PCP OFFICE

## 2021-05-21 NOTE — Telephone Encounter (Signed)
Left voice message for patient to call clinic.  

## 2021-05-23 ENCOUNTER — Other Ambulatory Visit: Payer: Self-pay

## 2021-05-23 DIAGNOSIS — I10 Essential (primary) hypertension: Secondary | ICD-10-CM

## 2021-05-23 MED ORDER — FUROSEMIDE 20 MG PO TABS: 20.0000 mg | ORAL_TABLET | ORAL | 1 refills | Status: DC

## 2021-05-23 MED ORDER — BLOOD GLUCOSE MONITOR KIT: PACK | 0 refills | Status: DC

## 2021-05-23 MED ORDER — POTASSIUM CHLORIDE ER 20 MEQ PO TBCR: 20.0000 meq | EXTENDED_RELEASE_TABLET | ORAL | Status: DC

## 2021-05-23 MED ORDER — FUROSEMIDE 40 MG PO TABS: 40.0000 mg | ORAL_TABLET | ORAL | Status: DC

## 2021-05-23 NOTE — Telephone Encounter (Signed)
Rx sent in

## 2021-05-24 ENCOUNTER — Telehealth: Payer: Self-pay | Admitting: Physician Assistant

## 2021-05-24 NOTE — Telephone Encounter (Signed)
Patient called staing that she has been waiting on refills for 5 medications all week. Patient wants a call back today. Patient states some of the medications are: Pregabalin, Lasix 45m, Lasix 448m Glucose test strips and kit,

## 2021-05-27 ENCOUNTER — Other Ambulatory Visit: Payer: Self-pay | Admitting: Physician Assistant

## 2021-05-27 ENCOUNTER — Other Ambulatory Visit: Payer: Self-pay

## 2021-05-27 MED ORDER — POTASSIUM CHLORIDE ER 20 MEQ PO TBCR: 20.0000 meq | EXTENDED_RELEASE_TABLET | ORAL | 1 refills | Status: DC

## 2021-05-27 MED ORDER — FUROSEMIDE 20 MG PO TABS
20.0000 mg | ORAL_TABLET | ORAL | 2 refills | Status: DC
Start: 2021-05-27 — End: 2021-08-05

## 2021-05-27 MED ORDER — PREGABALIN 75 MG PO CAPS: 75.0000 mg | ORAL_CAPSULE | Freq: Two times a day (BID) | ORAL | 0 refills | Status: DC

## 2021-05-27 MED ORDER — PREGABALIN 75 MG PO CAPS: 75.0000 mg | ORAL_CAPSULE | Freq: Two times a day (BID) | ORAL | 2 refills | Status: DC

## 2021-05-27 MED ORDER — FUROSEMIDE 40 MG PO TABS: 40.0000 mg | ORAL_TABLET | ORAL | 2 refills | Status: DC

## 2021-05-27 MED ORDER — BLOOD GLUCOSE MONITOR KIT
PACK | 0 refills | Status: AC
Start: 2021-05-27 — End: ?

## 2021-05-27 NOTE — Telephone Encounter (Signed)
Rx sent in,had to change pharmacy and verify doses

## 2021-05-28 ENCOUNTER — Telehealth: Payer: 59 | Admitting: Physician Assistant

## 2021-05-28 NOTE — Telephone Encounter (Signed)
Pt states that her pharmacy needs Prior Authorizations for 10 medications. The pharmacy if [facility] # [phone], Fax # [phone]. Please fax authorizations to [employer]. Call pt at [phone].

## 2021-05-30 ENCOUNTER — Other Ambulatory Visit: Payer: Self-pay | Admitting: Physician Assistant

## 2021-05-30 MED ORDER — PREGABALIN 75 MG PO CAPS: 75.0000 mg | ORAL_CAPSULE | Freq: Two times a day (BID) | ORAL | 1 refills | Status: DC

## 2021-05-30 NOTE — Telephone Encounter (Signed)
Patient has called back in regard.    Is requesting call back at [phone] ASAP.

## 2021-05-30 NOTE — Telephone Encounter (Signed)
Spoke with pharmacy gave a verbal order of medications they will be sending to the pharmacy.

## 2021-05-30 NOTE — Telephone Encounter (Signed)
Thank you for taking care of this. Lyrica also resent as this did not go through correctly the first time.

## 2021-05-31 ENCOUNTER — Telehealth: Payer: Self-pay | Admitting: Physician Assistant

## 2021-05-31 ENCOUNTER — Other Ambulatory Visit: Payer: Self-pay | Admitting: Family

## 2021-05-31 DIAGNOSIS — E1159 Type 2 diabetes mellitus with other circulatory complications: Secondary | ICD-10-CM

## 2021-05-31 DIAGNOSIS — Z794 Long term (current) use of insulin: Secondary | ICD-10-CM

## 2021-05-31 NOTE — Telephone Encounter (Signed)
Patient needs an approval for Humalog 75/25 to be sent to her pharmacy. Pt needs this medication asap. Please call her today at [phone].

## 2021-06-03 ENCOUNTER — Other Ambulatory Visit: Payer: Self-pay

## 2021-06-03 MED ORDER — INSULIN LISPRO 100 UNIT/ML IJ SOLN: 2.0000 [IU] | Freq: Every day | INTRAMUSCULAR | 2 refills | Status: DC

## 2021-06-03 NOTE — Telephone Encounter (Signed)
Rx sent in

## 2021-06-06 ENCOUNTER — Telehealth: Payer: Self-pay | Admitting: *Deleted

## 2021-06-06 NOTE — Telephone Encounter (Signed)
Error

## 2021-06-07 ENCOUNTER — Other Ambulatory Visit: Payer: Self-pay

## 2021-06-07 ENCOUNTER — Telehealth: Payer: Self-pay | Admitting: Physician Assistant

## 2021-06-07 MED ORDER — FUROSEMIDE 40 MG PO TABS: 40.0000 mg | ORAL_TABLET | ORAL | 2 refills | Status: DC

## 2021-06-07 NOTE — Telephone Encounter (Signed)
Pt called stating that [place] needs a approval from [employer] for Humalog. Please call pt at the number on file. ?

## 2021-06-07 NOTE — Telephone Encounter (Signed)
Spoke with patient stated need PA, PA started. No need for PA insurance cover  ?Call patient Pharmacy [facility]  ?Pharmacy requesting dose verification  ?Please advise  ?

## 2021-06-07 NOTE — Telephone Encounter (Signed)
Left message to return call to our office at their convenience.  ? ?Rx was send in patient need to call pharmacy for refills  ?

## 2021-06-10 ENCOUNTER — Other Ambulatory Visit: Payer: Self-pay

## 2021-06-10 MED ORDER — INSULIN LISPRO 100 UNIT/ML IJ SOLN: 1.0000 [IU] | Freq: Every day | INTRAMUSCULAR | 2 refills | Status: DC

## 2021-06-10 NOTE — Telephone Encounter (Signed)
Rx sent in and corrected  0.01 units to 0.12 units daily ?

## 2021-06-12 NOTE — Telephone Encounter (Signed)
Patient is requesting call back at [phone].

## 2021-06-12 NOTE — Telephone Encounter (Signed)
Appt schedules 06/12/2021 ?

## 2021-06-13 ENCOUNTER — Telehealth (INDEPENDENT_AMBULATORY_CARE_PROVIDER_SITE_OTHER): Payer: 59 | Admitting: Physician Assistant

## 2021-06-13 VITALS — Ht 63.0 in

## 2021-06-13 DIAGNOSIS — E1151 Type 2 diabetes mellitus with diabetic peripheral angiopathy without gangrene: Secondary | ICD-10-CM

## 2021-06-13 MED ORDER — INSULIN LISPRO PROT & LISPRO (75-25 MIX) 100 UNIT/ML KWIKPEN: 30.0000 [IU] | PEN_INJECTOR | Freq: Two times a day (BID) | SUBCUTANEOUS | 1 refills | Status: DC

## 2021-06-13 NOTE — Progress Notes (Signed)
? ?  Virtual Visit via Video Note ? ?I connected with  Shirley Chen  on 06/13/21 at 11:00 AM EST by a video enabled telemedicine application and verified that I am speaking with the correct person using two identifiers. ? ?Location: ?Patient: home ?Provider: Nature conservation officer at [employer] ?Persons present: Patient and myself ?  ?I discussed the limitations of evaluation and management by telemedicine and the availability of in person appointments. The patient expressed understanding and agreed to proceed.  ? ?History of Present Illness: ? ?Pleasant 66 year old female patient with history of type 2 diabetes mellitus with complications including peripheral vascular disease presents for virtual visit today to discuss issues with her insulin.  She is currently taking Lantus Solostar 30 units at bedtime and she is also taking aspart twice daily per sliding scale as listed: If glucose is 250-300 - 8 units; 150-200; 3 units; none unless over 120-150 2 units; 200-250 - 5 units. ? ?Aspart and Lantus - no longer covered by insurance and she is about to run out. She has one more pen of aspart and almost out of lantus at night.  ? ?Patient states that she was able to call pharmacy [facility] and Humalog KwikPens should be covered ? ?She was on Humalog 75/25 previously - was taking 30 units BID and said her glucose control was good.  She would like to have this medication refilled. ? ?[facility] phone number - [phone] ? ? ?Observations/Objective: ? ? ?Gen: Awake, alert, no acute distress ?Resp: Breathing is even and non-labored ?Psych: calm/pleasant demeanor ?Neuro: Alert and Oriented x 3, + facial symmetry, speech is clear. ? ? ?Assessment and Plan: ? ?1. Diabetes mellitus with peripheral vascular disease (HCC) ?Medications were reconciled via today's virtual visit.  It appears that Humalog 75/25 KwikPen's are covered by her insurance and I have placed a new order for this prescription today.  She is to start  back on 30 units twice daily.  She is going to monitor her sugar at least twice daily.  She will continue to work on good dietary habits.  She will call if she has any problems receiving this medication.  She will follow-up with me in office as she is already scheduled. ? ? ?Follow Up Instructions: ? ?  ?I discussed the assessment and treatment plan with the patient. The patient was provided an opportunity to ask questions and all were answered. The patient agreed with the plan and demonstrated an understanding of the instructions. ?  ?The patient was advised to call back or seek an in-person evaluation if the symptoms worsen or if the condition fails to improve as anticipated. ? ?Video connection was lost at >50% of the duration of the visit, at which time the remainder of the visit was completed via audio only. ? ?Total time 17 min, 59 seconds ? ?Shirley Frankson M Keymon Mcelroy, PA-C ? ?

## 2021-06-19 ENCOUNTER — Other Ambulatory Visit: Payer: Self-pay

## 2021-06-24 DIAGNOSIS — E1169 Type 2 diabetes mellitus with other specified complication: Secondary | ICD-10-CM

## 2021-06-24 DIAGNOSIS — J9601 Acute respiratory failure with hypoxia: Secondary | ICD-10-CM

## 2021-06-24 DIAGNOSIS — I5030 Unspecified diastolic (congestive) heart failure: Secondary | ICD-10-CM

## 2021-06-24 DIAGNOSIS — E1151 Type 2 diabetes mellitus with diabetic peripheral angiopathy without gangrene: Secondary | ICD-10-CM

## 2021-06-24 DIAGNOSIS — E785 Hyperlipidemia, unspecified: Secondary | ICD-10-CM

## 2021-06-24 DIAGNOSIS — I7025 Atherosclerosis of native arteries of other extremities with ulceration: Secondary | ICD-10-CM

## 2021-06-24 DIAGNOSIS — D72829 Elevated white blood cell count, unspecified: Secondary | ICD-10-CM

## 2021-06-24 DIAGNOSIS — I130 Hypertensive heart and chronic kidney disease with heart failure and stage 1 through stage 4 chronic kidney disease, or unspecified chronic kidney disease: Secondary | ICD-10-CM

## 2021-06-24 DIAGNOSIS — E1122 Type 2 diabetes mellitus with diabetic chronic kidney disease: Secondary | ICD-10-CM | POA: Diagnosis not present

## 2021-06-24 DIAGNOSIS — N1831 Chronic kidney disease, stage 3a: Secondary | ICD-10-CM | POA: Diagnosis not present

## 2021-06-24 DIAGNOSIS — Z9181 History of falling: Secondary | ICD-10-CM

## 2021-06-24 DIAGNOSIS — Z794 Long term (current) use of insulin: Secondary | ICD-10-CM

## 2021-06-24 DIAGNOSIS — Z7982 Long term (current) use of aspirin: Secondary | ICD-10-CM

## 2021-06-24 DIAGNOSIS — D631 Anemia in chronic kidney disease: Secondary | ICD-10-CM

## 2021-06-24 DIAGNOSIS — M726 Necrotizing fasciitis: Secondary | ICD-10-CM

## 2021-06-24 DIAGNOSIS — Z8616 Personal history of COVID-19: Secondary | ICD-10-CM

## 2021-07-23 ENCOUNTER — Other Ambulatory Visit: Payer: Self-pay | Admitting: Physician Assistant

## 2021-08-05 ENCOUNTER — Ambulatory Visit (INDEPENDENT_AMBULATORY_CARE_PROVIDER_SITE_OTHER): Payer: 59

## 2021-08-05 DIAGNOSIS — Z0000 Encounter for general adult medical examination without abnormal findings: Secondary | ICD-10-CM

## 2021-08-05 NOTE — Patient Instructions (Signed)
Shirley Chen , ?Thank you for taking time to come for your Medicare Wellness Visit. I appreciate your ongoing commitment to your health goals. Please review the following plan we discussed and let me know if I can assist you in the future.  ? ?Screening recommendations/referrals: ?Colonoscopy: No longer required per pt   ?Mammogram: No longer required per pt ?Recommended yearly ophthalmology/optometry visit for glaucoma screening and checkup ?Recommended yearly dental visit for hygiene and checkup ? ?Vaccinations: ?Influenza vaccine: Done 05/14/21 repeat every year  ?Pneumococcal vaccine: Due  ?Tdap vaccine: Due  ?Shingles vaccine: Shingrix discussed. Please contact your pharmacy for coverage information.    ?Covid-19:Declined  ? ?Advanced directives: Advance directive discussed with you today. Even though you declined this today please call our office should you change your mind and we can give you the proper paperwork for you to fill out. ? ?Conditions/risks identified: to be without the pain  ? ?Next appointment: Follow up in one year for your annual wellness visit  ? ? ?Preventive Care 16 Years and Older, Female ?Preventive care refers to lifestyle choices and visits with your health care provider that can promote health and wellness. ?What does preventive care include? ?A yearly physical exam. This is also called an annual well check. ?Dental exams once or twice a year. ?Routine eye exams. Ask your health care provider how often you should have your eyes checked. ?Personal lifestyle choices, including: ?Daily care of your teeth and gums. ?Regular physical activity. ?Eating a healthy diet. ?Avoiding tobacco and drug use. ?Limiting alcohol use. ?Practicing safe sex. ?Taking low-dose aspirin every day. ?Taking vitamin and mineral supplements as recommended by your health care provider. ?What happens during an annual well check? ?The services and screenings done by your health care provider during your annual well  check will depend on your age, overall health, lifestyle risk factors, and family history of disease. ?Counseling  ?Your health care provider may ask you questions about your: ?Alcohol use. ?Tobacco use. ?Drug use. ?Emotional well-being. ?Home and relationship well-being. ?Sexual activity. ?Eating habits. ?History of falls. ?Memory and ability to understand (cognition). ?Work and work Astronomer. ?Reproductive health. ?Screening  ?You may have the following tests or measurements: ?Height, weight, and BMI. ?Blood pressure. ?Lipid and cholesterol levels. These may be checked every 5 years, or more frequently if you are over 47 years old. ?Skin check. ?Lung cancer screening. You may have this screening every year starting at age 23 if you have a 30-pack-year history of smoking and currently smoke or have quit within the past 15 years. ?Fecal occult blood test (FOBT) of the stool. You may have this test every year starting at age 99. ?Flexible sigmoidoscopy or colonoscopy. You may have a sigmoidoscopy every 5 years or a colonoscopy every 10 years starting at age 49. ?Hepatitis C blood test. ?Hepatitis B blood test. ?Sexually transmitted disease (STD) testing. ?Diabetes screening. This is done by checking your blood sugar (glucose) after you have not eaten for a while (fasting). You may have this done every 1-3 years. ?Bone density scan. This is done to screen for osteoporosis. You may have this done starting at age 68. ?Mammogram. This may be done every 1-2 years. Talk to your health care provider about how often you should have regular mammograms. ?Talk with your health care provider about your test results, treatment options, and if necessary, the need for more tests. ?Vaccines  ?Your health care provider may recommend certain vaccines, such as: ?Influenza vaccine. This is recommended every  year. ?Tetanus, diphtheria, and acellular pertussis (Tdap, Td) vaccine. You may need a Td booster every 10 years. ?Zoster  vaccine. You may need this after age 65. ?Pneumococcal 13-valent conjugate (PCV13) vaccine. One dose is recommended after age 65. ?Pneumococcal polysaccharide (PPSV23) vaccine. One dose is recommended after age 65. ?Talk to your health care provider about which screenings and vaccines you need and how often you need them. ?This information is not intended to replace advice given to you by your health care provider. Make sure you discuss any questions you have with your health care provider. ?Document Released: 04/20/2015 Document Revised: 12/12/2015 Document Reviewed: 01/23/2015 ?Elsevier Interactive Patient Education ? 2017 [employer] ? ?Fall Prevention in the Home ?Falls can cause injuries. They can happen to people of all ages. There are many things you can do to make your home safe and to help prevent falls. ?What can I do on the outside of my home? ?Regularly fix the edges of walkways and driveways and fix any cracks. ?Remove anything that might make you trip as you walk through a door, such as a raised step or threshold. ?Trim any bushes or trees on the path to your home. ?Use bright outdoor lighting. ?Clear any walking paths of anything that might make someone trip, such as rocks or tools. ?Regularly check to see if handrails are loose or broken. Make sure that both sides of any steps have handrails. ?Any raised decks and porches should have guardrails on the edges. ?Have any leaves, snow, or ice cleared regularly. ?Use sand or salt on walking paths during winter. ?Clean up any spills in your garage right away. This includes oil or grease spills. ?What can I do in the bathroom? ?Use night lights. ?Install grab bars by the toilet and in the tub and shower. Do not use towel bars as grab bars. ?Use non-skid mats or decals in the tub or shower. ?If you need to sit down in the shower, use a plastic, non-slip stool. ?Keep the floor dry. Clean up any water that spills on the floor as soon as it happens. ?Remove  soap buildup in the tub or shower regularly. ?Attach bath mats securely with double-sided non-slip rug tape. ?Do not have throw rugs and other things on the floor that can make you trip. ?What can I do in the bedroom? ?Use night lights. ?Make sure that you have a light by your bed that is easy to reach. ?Do not use any sheets or blankets that are too big for your bed. They should not hang down onto the floor. ?Have a firm chair that has side arms. You can use this for support while you get dressed. ?Do not have throw rugs and other things on the floor that can make you trip. ?What can I do in the kitchen? ?Clean up any spills right away. ?Avoid walking on wet floors. ?Keep items that you use a lot in easy-to-reach places. ?If you need to reach something above you, use a strong step stool that has a grab bar. ?Keep electrical cords out of the way. ?Do not use floor polish or wax that makes floors slippery. If you must use wax, use non-skid floor wax. ?Do not have throw rugs and other things on the floor that can make you trip. ?What can I do with my stairs? ?Do not leave any items on the stairs. ?Make sure that there are handrails on both sides of the stairs and use them. Fix handrails that are broken or  loose. Make sure that handrails are as long as the stairways. ?Check any carpeting to make sure that it is firmly attached to the stairs. Fix any carpet that is loose or worn. ?Avoid having throw rugs at the top or bottom of the stairs. If you do have throw rugs, attach them to the floor with carpet tape. ?Make sure that you have a light switch at the top of the stairs and the bottom of the stairs. If you do not have them, ask someone to add them for you. ?What else can I do to help prevent falls? ?Wear shoes that: ?Do not have high heels. ?Have rubber bottoms. ?Are comfortable and fit you well. ?Are closed at the toe. Do not wear sandals. ?If you use a stepladder: ?Make sure that it is fully opened. Do not climb a  closed stepladder. ?Make sure that both sides of the stepladder are locked into place. ?Ask someone to hold it for you, if possible. ?Clearly mark and make sure that you can see: ?Any grab bars or handrails. ?Vic Ripper
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
## 2021-08-09 ENCOUNTER — Telehealth: Payer: Self-pay | Admitting: Physician Assistant

## 2021-08-09 NOTE — Telephone Encounter (Signed)
..  Type of form received: home health cert form  ? ?Additional comments:  ? ?Received by: fax  ? ?Form should be Faxed to: ? ?Form should be mailed to:   ? ?Is patient requesting call for pickup: no   ? ? ?Form placed:   in [place]as folder upfront  ? ?Attach charge sheet. Yes  ? ?Individual made aware of 3-5 business day turn around (Y/N)? ?  Yes  ?

## 2021-08-12 ENCOUNTER — Encounter: Payer: Self-pay | Admitting: Physician Assistant

## 2021-08-12 ENCOUNTER — Ambulatory Visit (INDEPENDENT_AMBULATORY_CARE_PROVIDER_SITE_OTHER): Payer: 59 | Admitting: Physician Assistant

## 2021-08-12 VITALS — BP 122/76 | HR 69 | Temp 98.2°F | Ht 63.0 in | Wt 205.2 lb

## 2021-08-12 DIAGNOSIS — E1169 Type 2 diabetes mellitus with other specified complication: Secondary | ICD-10-CM | POA: Diagnosis not present

## 2021-08-12 DIAGNOSIS — I152 Hypertension secondary to endocrine disorders: Secondary | ICD-10-CM

## 2021-08-12 DIAGNOSIS — E785 Hyperlipidemia, unspecified: Secondary | ICD-10-CM | POA: Diagnosis not present

## 2021-08-12 DIAGNOSIS — N1832 Chronic kidney disease, stage 3b: Secondary | ICD-10-CM | POA: Diagnosis not present

## 2021-08-12 DIAGNOSIS — K5909 Other constipation: Secondary | ICD-10-CM

## 2021-08-12 DIAGNOSIS — E1122 Type 2 diabetes mellitus with diabetic chronic kidney disease: Secondary | ICD-10-CM | POA: Diagnosis not present

## 2021-08-12 DIAGNOSIS — E1159 Type 2 diabetes mellitus with other circulatory complications: Secondary | ICD-10-CM | POA: Diagnosis not present

## 2021-08-12 DIAGNOSIS — Z8349 Family history of other endocrine, nutritional and metabolic diseases: Secondary | ICD-10-CM

## 2021-08-12 DIAGNOSIS — Z794 Long term (current) use of insulin: Secondary | ICD-10-CM | POA: Diagnosis not present

## 2021-08-12 DIAGNOSIS — D508 Other iron deficiency anemias: Secondary | ICD-10-CM | POA: Diagnosis not present

## 2021-08-12 DIAGNOSIS — Z720 Tobacco use: Secondary | ICD-10-CM

## 2021-08-12 DIAGNOSIS — L720 Epidermal cyst: Secondary | ICD-10-CM

## 2021-08-12 LAB — LIPID PANEL
Cholesterol: 115 mg/dL (ref 0–200)
HDL: 38.2 mg/dL — ABNORMAL LOW (ref >39.00)
LDL Cholesterol: 48 mg/dL (ref 0–99)
NonHDL: 76.84
Total CHOL/HDL Ratio: 3
Triglycerides: 143 mg/dL (ref 0.0–149.0)
VLDL: 28.6 mg/dL (ref 0.0–40.0)

## 2021-08-12 LAB — COMPREHENSIVE METABOLIC PANEL
ALT: 13 U/L (ref 0–35)
AST: 18 U/L (ref 0–37)
Albumin: 4.4 g/dL (ref 3.5–5.2)
Alkaline Phosphatase: 121 U/L — ABNORMAL HIGH (ref 39–117)
BUN: 56 mg/dL — ABNORMAL HIGH (ref 6–23)
CO2: 27 mEq/L (ref 19–32)
Calcium: 10.2 mg/dL (ref 8.4–10.5)
Chloride: 100 mEq/L (ref 96–112)
Creatinine, Ser: 2.07 mg/dL — ABNORMAL HIGH (ref 0.40–1.20)
GFR: 24.8 mL/min — ABNORMAL LOW (ref >60.00)
Glucose, Bld: 113 mg/dL — ABNORMAL HIGH (ref 70–99)
Potassium: 4.6 mEq/L (ref 3.5–5.1)
Sodium: 140 mEq/L (ref 135–145)
Total Bilirubin: 1.1 mg/dL (ref 0.2–1.2)
Total Protein: 7.8 g/dL (ref 6.0–8.3)

## 2021-08-12 LAB — CBC WITH DIFFERENTIAL/PLATELET
Basophils Absolute: 0 10*3/uL (ref 0.0–0.1)
Basophils Relative: 0.3 % (ref 0.0–3.0)
Eosinophils Absolute: 0.1 10*3/uL (ref 0.0–0.7)
Eosinophils Relative: 1 % (ref 0.0–5.0)
HCT: 39.9 % (ref 36.0–46.0)
Hemoglobin: 13.4 g/dL (ref 12.0–15.0)
Lymphocytes Relative: 17.3 % (ref 12.0–46.0)
Lymphs Abs: 2.3 10*3/uL (ref 0.7–4.0)
MCHC: 33.5 g/dL (ref 30.0–36.0)
MCV: 90.8 fl (ref 78.0–100.0)
Monocytes Absolute: 0.8 10*3/uL (ref 0.1–1.0)
Monocytes Relative: 6 % (ref 3.0–12.0)
Neutro Abs: 10 10*3/uL — ABNORMAL HIGH (ref 1.4–7.7)
Neutrophils Relative %: 75.4 % (ref 43.0–77.0)
Platelets: 265 10*3/uL (ref 150.0–400.0)
RBC: 4.39 Mil/uL (ref 3.87–5.11)
RDW: 12.7 % (ref 11.5–15.5)
WBC: 13.2 10*3/uL — ABNORMAL HIGH (ref 4.0–10.5)

## 2021-08-12 LAB — TSH: TSH: 2.01 u[IU]/mL (ref 0.35–5.50)

## 2021-08-12 LAB — HEMOGLOBIN A1C: Hgb A1c MFr Bld: 8.9 % — ABNORMAL HIGH (ref 4.6–6.5)

## 2021-08-12 NOTE — Patient Instructions (Addendum)
Very good to see you today, Shirley Chen. ? ?The area of concern on your neck looks like an epidermoid cyst.  As long as its not bothersome we can just continue to monitor at this time.  Let me know if any changes or concerns. ? ?Labs today, treat pending results. ? ?I have placed an order for lung cancer screening CT. ? ?To talk about Cologuard versus colonoscopy.  Think about these options and we can discuss again at next visit. ? ?Ask pain management about Amitiza for opioid induced constipation. ?
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
## 2021-08-12 NOTE — Telephone Encounter (Signed)
Placed in box for review, and signature.  ?

## 2021-08-14 ENCOUNTER — Telehealth: Payer: Self-pay | Admitting: Physician Assistant

## 2021-08-14 NOTE — Telephone Encounter (Signed)
[facility] called asking if we received this patients Plan of care from 08/09/21. Rep will be faxing over POC again today.  ? ?Please call [phone] should you have any questions.  ?

## 2021-08-14 NOTE — Telephone Encounter (Signed)
Form was completed and placed in file to be faxed.  ?

## 2021-08-14 NOTE — Telephone Encounter (Signed)
Forms received and placed in the provider's box. Order # [id number] ?

## 2021-08-15 NOTE — Telephone Encounter (Signed)
Faxed back to facility. Confirmation page received. ?

## 2021-08-17 DIAGNOSIS — L720 Epidermal cyst: Secondary | ICD-10-CM | POA: Insufficient documentation

## 2021-08-17 DIAGNOSIS — K5909 Other constipation: Secondary | ICD-10-CM | POA: Insufficient documentation

## 2021-08-17 DIAGNOSIS — D649 Anemia, unspecified: Secondary | ICD-10-CM | POA: Insufficient documentation

## 2021-08-17 DIAGNOSIS — Z8349 Family history of other endocrine, nutritional and metabolic diseases: Secondary | ICD-10-CM | POA: Insufficient documentation

## 2021-08-17 NOTE — Assessment & Plan Note (Signed)
Patient is currently taking Lipitor 40 mg daily.  Recheck fasting lipid panel today. ?

## 2021-08-17 NOTE — Assessment & Plan Note (Signed)
Plan to check TSH and treat if needed ?

## 2021-08-17 NOTE — Assessment & Plan Note (Signed)
Blood pressure is stable and to goal.  She is taking Norvasc 10 mg daily and Coreg 25 mg twice daily.  Monitors blood pressure at home. ?

## 2021-08-17 NOTE — Assessment & Plan Note (Signed)
Is checking her sugars daily with freestyle monitoring kit. ?Humalog 75/25 KwikPen 30 units twice daily. ?Continue to work on [employer]. ?

## 2021-08-17 NOTE — Assessment & Plan Note (Signed)
Patient was very concerned and anxious about a new growing mass on the front of her neck.  Reassured her today this appears very superficial and benign consistent with probable epidermoid cyst.  Continue to monitor at this time and she knows to call if any changes. ?

## 2021-08-17 NOTE — Assessment & Plan Note (Signed)
Opioid induced.  Patient is going to talk to pain management about Amitiza.  She will let me know how this discussion goes. ?

## 2021-08-17 NOTE — Assessment & Plan Note (Signed)
Patient is over the age of 89 and has greater than 20-pack-year history.  Plan for low-dose CT screening. ?

## 2021-09-18 ENCOUNTER — Ambulatory Visit
Admission: RE | Admit: 2021-09-18 | Discharge: 2021-09-18 | Disposition: A | Discharge status: Home / Self Care | Payer: 59 | Source: Ambulatory Visit | Attending: Physician Assistant | Admitting: Physician Assistant

## 2021-09-18 DIAGNOSIS — Z720 Tobacco use: Secondary | ICD-10-CM

## 2021-09-20 ENCOUNTER — Telehealth: Payer: Self-pay | Admitting: Physician Assistant

## 2021-09-20 NOTE — Telephone Encounter (Signed)
See lab note.  

## 2021-09-20 NOTE — Telephone Encounter (Signed)
Patient requests to be called at ph# [phone] to be given CT Scan results

## 2021-10-12 ENCOUNTER — Other Ambulatory Visit: Payer: Self-pay | Admitting: Physician Assistant

## 2021-11-08 ENCOUNTER — Encounter: Payer: Self-pay | Admitting: Physician Assistant

## 2021-11-08 ENCOUNTER — Ambulatory Visit (INDEPENDENT_AMBULATORY_CARE_PROVIDER_SITE_OTHER): Payer: 59 | Admitting: Physician Assistant

## 2021-11-08 VITALS — BP 130/64 | HR 67 | Temp 97.9°F | Ht 63.0 in | Wt 218.2 lb

## 2021-11-08 DIAGNOSIS — E1159 Type 2 diabetes mellitus with other circulatory complications: Secondary | ICD-10-CM

## 2021-11-08 DIAGNOSIS — Z23 Encounter for immunization: Secondary | ICD-10-CM | POA: Diagnosis not present

## 2021-11-08 DIAGNOSIS — I152 Hypertension secondary to endocrine disorders: Secondary | ICD-10-CM

## 2021-11-08 DIAGNOSIS — Z780 Asymptomatic menopausal state: Secondary | ICD-10-CM

## 2021-11-08 DIAGNOSIS — I700 Atherosclerosis of aorta: Secondary | ICD-10-CM

## 2021-11-08 DIAGNOSIS — K8020 Calculus of gallbladder without cholecystitis without obstruction: Secondary | ICD-10-CM | POA: Insufficient documentation

## 2021-11-08 DIAGNOSIS — E1122 Type 2 diabetes mellitus with diabetic chronic kidney disease: Secondary | ICD-10-CM | POA: Diagnosis not present

## 2021-11-08 DIAGNOSIS — J432 Centrilobular emphysema: Secondary | ICD-10-CM | POA: Diagnosis not present

## 2021-11-08 DIAGNOSIS — N1832 Chronic kidney disease, stage 3b: Secondary | ICD-10-CM

## 2021-11-08 DIAGNOSIS — I2510 Atherosclerotic heart disease of native coronary artery without angina pectoris: Secondary | ICD-10-CM | POA: Insufficient documentation

## 2021-11-08 DIAGNOSIS — Z794 Long term (current) use of insulin: Secondary | ICD-10-CM
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
## 2021-11-08 NOTE — Patient Instructions (Signed)
-  Continue current medication regimen -Consider evaluation with cardiologist given advanced plaque and calcifications noted on recent CT -Keep sharing your light!

## 2021-11-11 ENCOUNTER — Ambulatory Visit: Payer: 59 | Admitting: Physician Assistant

## 2021-11-12 ENCOUNTER — Other Ambulatory Visit: Payer: Self-pay | Admitting: Physician Assistant

## 2021-11-12 DIAGNOSIS — I10 Essential (primary) hypertension: Secondary | ICD-10-CM

## 2021-11-12 DIAGNOSIS — E785 Hyperlipidemia, unspecified: Secondary | ICD-10-CM

## 2021-11-14 ENCOUNTER — Other Ambulatory Visit: Payer: Self-pay | Admitting: Physician Assistant

## 2021-11-14 NOTE — Telephone Encounter (Signed)
Last OV: 11/08/21  Next OV: 02/10/22  Last filled: 05/30/21  Quantity: 180 w/ 1 refill

## 2021-11-20 ENCOUNTER — Other Ambulatory Visit: Payer: Self-pay

## 2021-11-20 DIAGNOSIS — I10 Essential (primary) hypertension: Secondary | ICD-10-CM

## 2021-11-20 MED ORDER — ACCU-CHEK GUIDE VI STRP: ORAL_STRIP | 3 refills | Status: DC

## 2021-11-20 MED ORDER — HYDRALAZINE HCL 50 MG PO TABS: 50.0000 mg | ORAL_TABLET | Freq: Three times a day (TID) | ORAL | 3 refills | Status: DC

## 2021-12-30 ENCOUNTER — Encounter: Payer: Self-pay | Admitting: *Deleted

## 2022-02-10 ENCOUNTER — Ambulatory Visit (INDEPENDENT_AMBULATORY_CARE_PROVIDER_SITE_OTHER): Payer: 59 | Admitting: Physician Assistant

## 2022-02-10 ENCOUNTER — Encounter: Payer: Self-pay | Admitting: Physician Assistant

## 2022-02-10 VITALS — BP 144/79 | HR 70 | Temp 98.0°F | Ht 63.0 in | Wt 223.6 lb

## 2022-02-10 DIAGNOSIS — E1159 Type 2 diabetes mellitus with other circulatory complications: Secondary | ICD-10-CM

## 2022-02-10 DIAGNOSIS — E1169 Type 2 diabetes mellitus with other specified complication: Secondary | ICD-10-CM

## 2022-02-10 DIAGNOSIS — Z8669 Personal history of other diseases of the nervous system and sense organs: Secondary | ICD-10-CM

## 2022-02-10 DIAGNOSIS — M25542 Pain in joints of left hand: Secondary | ICD-10-CM | POA: Diagnosis not present

## 2022-02-10 DIAGNOSIS — Z23 Encounter for immunization: Secondary | ICD-10-CM

## 2022-02-10 DIAGNOSIS — E1122 Type 2 diabetes mellitus with diabetic chronic kidney disease: Secondary | ICD-10-CM

## 2022-02-10 DIAGNOSIS — I152 Hypertension secondary to endocrine disorders: Secondary | ICD-10-CM

## 2022-02-10 DIAGNOSIS — D508 Other iron deficiency anemias: Secondary | ICD-10-CM | POA: Diagnosis not present

## 2022-02-10 DIAGNOSIS — E785 Hyperlipidemia, unspecified: Secondary | ICD-10-CM | POA: Diagnosis not present

## 2022-02-10 DIAGNOSIS — I739 Peripheral vascular disease, unspecified: Secondary | ICD-10-CM

## 2022-02-10 DIAGNOSIS — N1832 Chronic kidney disease, stage 3b: Secondary | ICD-10-CM

## 2022-02-10 DIAGNOSIS — Z794 Long term (current) use of insulin: Secondary | ICD-10-CM | POA: Diagnosis not present

## 2022-02-10 DIAGNOSIS — H539 Unspecified visual disturbance: Secondary | ICD-10-CM | POA: Diagnosis not present

## 2022-02-10 LAB — CBC WITH DIFFERENTIAL/PLATELET
Basophils Absolute: 0 10*3/uL (ref 0.0–0.1)
Basophils Relative: 0.3 % (ref 0.0–3.0)
Eosinophils Absolute: 0.2 10*3/uL (ref 0.0–0.7)
Eosinophils Relative: 1.5 % (ref 0.0–5.0)
HCT: 38.2 % (ref 36.0–46.0)
Hemoglobin: 12.5 g/dL (ref 12.0–15.0)
Lymphocytes Relative: 19.1 % (ref 12.0–46.0)
Lymphs Abs: 2.5 10*3/uL (ref 0.7–4.0)
MCHC: 32.8 g/dL (ref 30.0–36.0)
MCV: 90.6 fl (ref 78.0–100.0)
Monocytes Absolute: 0.9 10*3/uL (ref 0.1–1.0)
Monocytes Relative: 7 % (ref 3.0–12.0)
Neutro Abs: 9.3 10*3/uL — ABNORMAL HIGH (ref 1.4–7.7)
Neutrophils Relative %: 72.1 % (ref 43.0–77.0)
Platelets: 308 10*3/uL (ref 150.0–400.0)
RBC: 4.22 Mil/uL (ref 3.87–5.11)
RDW: 13.8 % (ref 11.5–15.5)
WBC: 12.9 10*3/uL — ABNORMAL HIGH (ref 4.0–10.5)

## 2022-02-10 LAB — COMPREHENSIVE METABOLIC PANEL
ALT: 11 U/L (ref 0–35)
AST: 13 U/L (ref 0–37)
Albumin: 4.2 g/dL (ref 3.5–5.2)
Alkaline Phosphatase: 162 U/L — ABNORMAL HIGH (ref 39–117)
BUN: 33 mg/dL — ABNORMAL HIGH (ref 6–23)
CO2: 28 mEq/L (ref 19–32)
Calcium: 9.6 mg/dL (ref 8.4–10.5)
Chloride: 103 mEq/L (ref 96–112)
Creatinine, Ser: 1.67 mg/dL — ABNORMAL HIGH (ref 0.40–1.20)
GFR: 31.98 mL/min — ABNORMAL LOW (ref >60.00)
Glucose, Bld: 99 mg/dL (ref 70–99)
Potassium: 4.4 mEq/L (ref 3.5–5.1)
Sodium: 142 mEq/L (ref 135–145)
Total Bilirubin: 0.9 mg/dL (ref 0.2–1.2)
Total Protein: 6.7 g/dL (ref 6.0–8.3)

## 2022-02-10 LAB — LIPID PANEL
Cholesterol: 129 mg/dL (ref 0–200)
HDL: 47.9 mg/dL (ref >39.00)
LDL Cholesterol: 44 mg/dL (ref 0–99)
NonHDL: 81.46
Total CHOL/HDL Ratio: 3
Triglycerides: 187 mg/dL — ABNORMAL HIGH (ref 0.0–149.0)
VLDL: 37.4 mg/dL (ref 0.0–40.0)

## 2022-02-10 LAB — HEMOGLOBIN A1C: Hgb A1c MFr Bld: 8.7 % — ABNORMAL HIGH (ref 4.6–6.5)

## 2022-02-10 MED ORDER — DICLOFENAC SODIUM 1 % EX GEL: 2.0000 g | Freq: Four times a day (QID) | CUTANEOUS | 2 refills | Status: DC

## 2022-02-10 NOTE — Assessment & Plan Note (Signed)
Plan to recheck blood work today.  She is currently using her freestyle monitor.  Using Humalog 75-25 KwikPen 30 units twice daily.

## 2022-02-10 NOTE — Patient Instructions (Addendum)
Good to see you today.  Labs & flu shot today.  [facility] Nov 15th - 7:30 am - Dr. Hillery Hunter Address: [address], [place], [place] [address] Phone: [phone]  Try the Voltaren gel for your thumb pain. Consider sports med / hand ortho referral for additional help.

## 2022-02-10 NOTE — Assessment & Plan Note (Signed)
Blood pressure slightly elevated today.  She is going to continue Norvasc 10 mg daily and Coreg 25 mg twice daily.  She will continue to monitor at home.

## 2022-02-10 NOTE — Assessment & Plan Note (Signed)
She is due for follow-up with vein and vascular.  Because of scheduling conflicts and difficulty getting a ride, she does not want to proceed with this at this time.

## 2022-02-10 NOTE — Assessment & Plan Note (Signed)
Recheck fasting lipid panel.  Continue Lipitor 40 mg daily.
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
## 2022-02-19 LAB — HM DIABETES EYE EXAM

## 2022-02-20 ENCOUNTER — Encounter: Payer: Self-pay | Admitting: Physician Assistant

## 2022-03-03 ENCOUNTER — Other Ambulatory Visit: Payer: Self-pay | Admitting: Physician Assistant

## 2022-03-03 NOTE — Telephone Encounter (Signed)
Please see note from front office for patient and send medication for patient. Patient out of medication but not received a refill request for this patient medication below prior to today.

## 2022-04-28 ENCOUNTER — Telehealth: Payer: Self-pay | Admitting: Physician Assistant

## 2022-04-28 NOTE — Telephone Encounter (Signed)
Contacted [facility] and spoke with Pharmacist and advised verbal order for patient refill of needles of insulin.

## 2022-04-28 NOTE — Telephone Encounter (Signed)
Patient states: - She is in need of new diabetic needles; 8 mm x 31 G  - [facility] needs verbal okay from PCP to get filled  - [place] can be reached at [phone]

## 2022-05-01 ENCOUNTER — Other Ambulatory Visit: Payer: 59

## 2022-05-13 ENCOUNTER — Encounter: Payer: Self-pay | Admitting: Physician Assistant

## 2022-05-13 ENCOUNTER — Ambulatory Visit (INDEPENDENT_AMBULATORY_CARE_PROVIDER_SITE_OTHER): Payer: 59 | Admitting: Physician Assistant

## 2022-05-13 VITALS — BP 130/76 | HR 79 | Temp 97.7°F | Ht 63.0 in | Wt 224.6 lb

## 2022-05-13 DIAGNOSIS — K5903 Drug induced constipation: Secondary | ICD-10-CM

## 2022-05-13 DIAGNOSIS — D72829 Elevated white blood cell count, unspecified: Secondary | ICD-10-CM

## 2022-05-13 DIAGNOSIS — Z794 Long term (current) use of insulin: Secondary | ICD-10-CM

## 2022-05-13 DIAGNOSIS — N1832 Chronic kidney disease, stage 3b: Secondary | ICD-10-CM | POA: Diagnosis not present

## 2022-05-13 DIAGNOSIS — E1159 Type 2 diabetes mellitus with other circulatory complications: Secondary | ICD-10-CM

## 2022-05-13 DIAGNOSIS — E1122 Type 2 diabetes mellitus with diabetic chronic kidney disease: Secondary | ICD-10-CM | POA: Diagnosis not present

## 2022-05-13 DIAGNOSIS — L723 Sebaceous cyst: Secondary | ICD-10-CM

## 2022-05-13 DIAGNOSIS — T402X5A Adverse effect of other opioids, initial encounter: Secondary | ICD-10-CM | POA: Insufficient documentation

## 2022-05-13 DIAGNOSIS — R748 Abnormal levels of other serum enzymes: Secondary | ICD-10-CM | POA: Diagnosis not present

## 2022-05-13 DIAGNOSIS — I152 Hypertension secondary to endocrine disorders: Secondary | ICD-10-CM | POA: Diagnosis not present

## 2022-05-13 LAB — CBC WITH DIFFERENTIAL/PLATELET
Basophils Absolute: 0 10*3/uL (ref 0.0–0.1)
Basophils Relative: 0.2 % (ref 0.0–3.0)
Eosinophils Absolute: 0.2 10*3/uL (ref 0.0–0.7)
Eosinophils Relative: 1.2 % (ref 0.0–5.0)
HCT: 37.5 % (ref 36.0–46.0)
Hemoglobin: 12.5 g/dL (ref 12.0–15.0)
Lymphocytes Relative: 16.6 % (ref 12.0–46.0)
Lymphs Abs: 2.4 10*3/uL (ref 0.7–4.0)
MCHC: 33.3 g/dL (ref 30.0–36.0)
MCV: 91.7 fl (ref 78.0–100.0)
Monocytes Absolute: 1 10*3/uL (ref 0.1–1.0)
Monocytes Relative: 6.9 % (ref 3.0–12.0)
Neutro Abs: 10.8 10*3/uL — ABNORMAL HIGH (ref 1.4–7.7)
Neutrophils Relative %: 75.1 % (ref 43.0–77.0)
Platelets: 280 10*3/uL (ref 150.0–400.0)
RBC: 4.09 Mil/uL (ref 3.87–5.11)
RDW: 13.3 % (ref 11.5–15.5)
WBC: 14.3 10*3/uL — ABNORMAL HIGH (ref 4.0–10.5)

## 2022-05-13 LAB — HEMOGLOBIN A1C: Hgb A1c MFr Bld: 7.7 % — ABNORMAL HIGH (ref 4.6–6.5)

## 2022-05-13 LAB — COMPREHENSIVE METABOLIC PANEL
ALT: 11 U/L (ref 0–35)
AST: 11 U/L (ref 0–37)
Albumin: 4.1 g/dL (ref 3.5–5.2)
Alkaline Phosphatase: 135 U/L — ABNORMAL HIGH (ref 39–117)
BUN: 45 mg/dL — ABNORMAL HIGH (ref 6–23)
CO2: 28 mEq/L (ref 19–32)
Calcium: 9.7 mg/dL (ref 8.4–10.5)
Chloride: 102 mEq/L (ref 96–112)
Creatinine, Ser: 1.67 mg/dL — ABNORMAL HIGH (ref 0.40–1.20)
GFR: 31.92 mL/min — ABNORMAL LOW (ref >60.00)
Glucose, Bld: 127 mg/dL — ABNORMAL HIGH (ref 70–99)
Potassium: 4.5 mEq/L (ref 3.5–5.1)
Sodium: 144 mEq/L (ref 135–145)
Total Bilirubin: 0.9 mg/dL (ref 0.2–1.2)
Total Protein: 6.7 g/dL (ref 6.0–8.3)

## 2022-05-13 LAB — MICROALBUMIN / CREATININE URINE RATIO
Creatinine,U: 156.4 mg/dL
Microalb Creat Ratio: 4.9 mg/g (ref 0.0–30.0)
Microalb, Ur: 7.6 mg/dL — ABNORMAL HIGH (ref 0.0–1.9)

## 2022-05-13 MED ORDER — LINACLOTIDE 145 MCG PO CAPS: 145.0000 ug | ORAL_CAPSULE | Freq: Every day | ORAL | 1 refills | Status: DC
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
## 2022-05-15 NOTE — Assessment & Plan Note (Signed)
Chronic constipation due to opioid therapy - Dulcolax, stool softeners, Benefiber, Miralax has not been helpful. Will try to get Linzess 145 mcg daily approved to help with this concern.

## 2022-05-15 NOTE — Assessment & Plan Note (Addendum)
Recheck labs today.  Currently taking Humalog 75/25 - 33 units in morning and 30 units in evening. Fasting glucose this morning was 160.  Rybelsus 3 mg daily.

## 2022-05-15 NOTE — Assessment & Plan Note (Addendum)
Normotensive. Cont Norvasc 10 mg qd; Coreg 25 mg BID. Hydralazine 50 mg TID. Monitor at home.

## 2022-05-15 NOTE — Assessment & Plan Note (Signed)
Check labs, trend today.  

## 2022-05-15 NOTE — Assessment & Plan Note (Signed)
Check labs, trend today.

## 2022-05-16 NOTE — Progress Notes (Signed)
Noted  

## 2022-05-20 ENCOUNTER — Other Ambulatory Visit: Payer: Self-pay

## 2022-05-20 ENCOUNTER — Telehealth: Payer: Self-pay | Admitting: Physician Assistant

## 2022-05-20 DIAGNOSIS — Z780 Asymptomatic menopausal state: Secondary | ICD-10-CM

## 2022-05-20 NOTE — Telephone Encounter (Signed)
Order has already been placed and ready to be scheduled.

## 2022-05-20 NOTE — Telephone Encounter (Signed)
Ok to place order for Dexa Scan

## 2022-05-20 NOTE — Telephone Encounter (Signed)
Please reorder the Bone Density order per patient. She can do it now. Please advise.

## 2022-06-05 ENCOUNTER — Other Ambulatory Visit: Payer: Self-pay | Admitting: Physician Assistant

## 2022-06-06 ENCOUNTER — Other Ambulatory Visit: Payer: Self-pay | Admitting: Physician Assistant

## 2022-06-06 DIAGNOSIS — Z780 Asymptomatic menopausal state: Secondary | ICD-10-CM

## 2022-06-06 NOTE — Telephone Encounter (Signed)
Last OV: 05/13/22  Next OV: 08/11/22  Last filled: 03/03/22  Quantity: 180

## 2022-06-10 ENCOUNTER — Other Ambulatory Visit: Payer: Self-pay | Admitting: Physician Assistant

## 2022-06-14 ENCOUNTER — Other Ambulatory Visit: Payer: Self-pay | Admitting: Physician Assistant

## 2022-06-14 DIAGNOSIS — I10 Essential (primary) hypertension: Secondary | ICD-10-CM

## 2022-06-14 DIAGNOSIS — E785 Hyperlipidemia, unspecified: Secondary | ICD-10-CM

## 2022-07-29 ENCOUNTER — Other Ambulatory Visit: Payer: Self-pay | Admitting: Physician Assistant

## 2022-08-03 ENCOUNTER — Other Ambulatory Visit: Payer: Self-pay | Admitting: Physician Assistant

## 2022-08-11 ENCOUNTER — Ambulatory Visit (INDEPENDENT_AMBULATORY_CARE_PROVIDER_SITE_OTHER): Payer: 59 | Admitting: Physician Assistant

## 2022-08-11 ENCOUNTER — Encounter: Payer: Self-pay | Admitting: Physician Assistant

## 2022-08-11 ENCOUNTER — Ambulatory Visit (INDEPENDENT_AMBULATORY_CARE_PROVIDER_SITE_OTHER): Payer: 59

## 2022-08-11 VITALS — BP 110/62 | HR 75 | Temp 97.8°F | Ht 63.0 in | Wt 227.4 lb

## 2022-08-11 VITALS — Wt 227.0 lb

## 2022-08-11 DIAGNOSIS — E1140 Type 2 diabetes mellitus with diabetic neuropathy, unspecified: Secondary | ICD-10-CM | POA: Diagnosis not present

## 2022-08-11 DIAGNOSIS — Z7984 Long term (current) use of oral hypoglycemic drugs: Secondary | ICD-10-CM

## 2022-08-11 DIAGNOSIS — E1122 Type 2 diabetes mellitus with diabetic chronic kidney disease: Secondary | ICD-10-CM | POA: Diagnosis not present

## 2022-08-11 DIAGNOSIS — Z89512 Acquired absence of left leg below knee: Secondary | ICD-10-CM

## 2022-08-11 DIAGNOSIS — N1832 Chronic kidney disease, stage 3b: Secondary | ICD-10-CM | POA: Diagnosis not present

## 2022-08-11 DIAGNOSIS — R3989 Other symptoms and signs involving the genitourinary system: Secondary | ICD-10-CM | POA: Diagnosis not present

## 2022-08-11 DIAGNOSIS — Z794 Long term (current) use of insulin: Secondary | ICD-10-CM

## 2022-08-11 DIAGNOSIS — Z0000 Encounter for general adult medical examination without abnormal findings: Secondary | ICD-10-CM | POA: Diagnosis not present

## 2022-08-11 DIAGNOSIS — I152 Hypertension secondary to endocrine disorders: Secondary | ICD-10-CM

## 2022-08-11 DIAGNOSIS — E1159 Type 2 diabetes mellitus with other circulatory complications: Secondary | ICD-10-CM

## 2022-08-11 LAB — CBC WITH DIFFERENTIAL/PLATELET
Basophils Absolute: 0.1 10*3/uL (ref 0.0–0.1)
Basophils Relative: 0.5 % (ref 0.0–3.0)
Eosinophils Absolute: 0.3 10*3/uL (ref 0.0–0.7)
Eosinophils Relative: 1.7 % (ref 0.0–5.0)
HCT: 38.3 % (ref 36.0–46.0)
Hemoglobin: 12.6 g/dL (ref 12.0–15.0)
Lymphocytes Relative: 19.7 % (ref 12.0–46.0)
Lymphs Abs: 3.1 10*3/uL (ref 0.7–4.0)
MCHC: 32.9 g/dL (ref 30.0–36.0)
MCV: 91.1 fl (ref 78.0–100.0)
Monocytes Absolute: 0.9 10*3/uL (ref 0.1–1.0)
Monocytes Relative: 5.9 % (ref 3.0–12.0)
Neutro Abs: 11.5 10*3/uL — ABNORMAL HIGH (ref 1.4–7.7)
Neutrophils Relative %: 72.2 % (ref 43.0–77.0)
Platelets: 302 10*3/uL (ref 150.0–400.0)
RBC: 4.21 Mil/uL (ref 3.87–5.11)
RDW: 13.7 % (ref 11.5–15.5)
WBC: 15.9 10*3/uL — ABNORMAL HIGH (ref 4.0–10.5)

## 2022-08-11 LAB — COMPREHENSIVE METABOLIC PANEL
ALT: 13 U/L (ref 0–35)
AST: 14 U/L (ref 0–37)
Albumin: 3.9 g/dL (ref 3.5–5.2)
Alkaline Phosphatase: 121 U/L — ABNORMAL HIGH (ref 39–117)
BUN: 36 mg/dL — ABNORMAL HIGH (ref 6–23)
CO2: 30 mEq/L (ref 19–32)
Calcium: 9.5 mg/dL (ref 8.4–10.5)
Chloride: 101 mEq/L (ref 96–112)
Creatinine, Ser: 1.64 mg/dL — ABNORMAL HIGH (ref 0.40–1.20)
GFR: 32.56 mL/min — ABNORMAL LOW (ref >60.00)
Glucose, Bld: 97 mg/dL (ref 70–99)
Potassium: 4.5 mEq/L (ref 3.5–5.1)
Sodium: 142 mEq/L (ref 135–145)
Total Bilirubin: 0.8 mg/dL (ref 0.2–1.2)
Total Protein: 6.4 g/dL (ref 6.0–8.3)

## 2022-08-11 LAB — URINALYSIS, ROUTINE W REFLEX MICROSCOPIC
Bilirubin Urine: NEGATIVE
Hgb urine dipstick: NEGATIVE
Ketones, ur: NEGATIVE
Nitrite: NEGATIVE
RBC / HPF: NONE SEEN (ref 0–?)
Specific Gravity, Urine: 1.02 (ref 1.000–1.030)
Total Protein, Urine: NEGATIVE
Urine Glucose: NEGATIVE
Urobilinogen, UA: 0.2 (ref 0.0–1.0)
pH: 6 (ref 5.0–8.0)

## 2022-08-11 LAB — HEMOGLOBIN A1C: Hgb A1c MFr Bld: 8.4 % — ABNORMAL HIGH (ref 4.6–6.5)

## 2022-08-11 NOTE — Assessment & Plan Note (Signed)
Wears prosthetic for ambulation. Secondary to T2DM. No issues with stump.

## 2022-08-11 NOTE — Assessment & Plan Note (Signed)
Lab Results  Component Value Date   HGBA1C 7.7 (H) 05/13/2022   HGBA1C 8.7 (H) 02/10/2022   HGBA1C 8.9 (H) 08/12/2021    Recheck labs today.  Currently taking Humalog 75/25 - 33 units in morning and 30 units in evening. Fasting glucose this morning was 160.  Rybelsus 3 mg daily.   Needs to drink more water and stop the Diet Mt. Dew!

## 2022-08-11 NOTE — Assessment & Plan Note (Signed)
Follows with [facility] Encouraged to take Lyrica 75 mg BID on regular basis. Recheck labs today as well.
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
## 2022-08-11 NOTE — Assessment & Plan Note (Signed)
Normotensive. Cont Norvasc 10 mg qd; Coreg 25 mg BID. Hydralazine 50 mg TID. Monitor at home.  

## 2022-08-11 NOTE — Patient Instructions (Signed)
Shirley Chen , Thank you for taking time to come for your Medicare Wellness Visit. I appreciate your ongoing commitment to your health goals. Please review the following plan we discussed and let me know if I can assist you in the future.   These are the goals we discussed:  Goals      Patient Stated     To be without pain         This is a list of the screening recommended for you and due dates:  Health Maintenance  Topic Date Due   DTaP/Tdap/Td vaccine (1 - Tdap) Never done   DEXA scan (bone density measurement)  Never done   Medicare Annual Wellness Visit  08/06/2022   Hepatitis C Screening: USPSTF Recommendation to screen - Ages 18-79 yo.  11/09/2022*   Zoster (Shingles) Vaccine (1 of 2) 07/18/2023*   Flu Shot  11/06/2022   Hemoglobin A1C  11/11/2022   Complete foot exam   02/11/2023   Yearly kidney function blood test for diabetes  05/14/2023   Yearly kidney health urinalysis for diabetes  05/14/2023   Pneumonia Vaccine  Completed   HIV Screening  Completed   HPV Vaccine  Aged Out   Mammogram  Discontinued   Pap Smear  Discontinued   Eye exam for diabetics  Discontinued   Colon Cancer Screening  Discontinued   COVID-19 Vaccine  Discontinued  *Topic was postponed. The date shown is not the original due date.    Advanced directives: Advance directive discussed with you today. Even though you declined this today please call our office should you change your mind and we can give you the proper paperwork for you to fill out.  Conditions/risks identified: none at this time   Next appointment: Follow up in one year for your annual wellness visit    Preventive Care 66 Years and Older, Female Preventive care refers to lifestyle choices and visits with your health care provider that can promote health and wellness. What does preventive care include? A yearly physical exam. This is also called an annual well check. Dental exams once or twice a year. Routine eye exams. Ask your  health care provider how often you should have your eyes checked. Personal lifestyle choices, including: Daily care of your teeth and gums. Regular physical activity. Eating a healthy diet. Avoiding tobacco and drug use. Limiting alcohol use. Practicing safe sex. Taking low-dose aspirin every day. Taking vitamin and mineral supplements as recommended by your health care provider. What happens during an annual well check? The services and screenings done by your health care provider during your annual well check will depend on your age, overall health, lifestyle risk factors, and family history of disease. Counseling  Your health care provider may ask you questions about your: Alcohol use. Tobacco use. Drug use. Emotional well-being. Home and relationship well-being. Sexual activity. Eating habits. History of falls. Memory and ability to understand (cognition). Work and work Astronomer. Reproductive health. Screening  You may have the following tests or measurements: Height, weight, and BMI. Blood pressure. Lipid and cholesterol levels. These may be checked every 5 years, or more frequently if you are over 51 years old. Skin check. Lung cancer screening. You may have this screening every year starting at age 18 if you have a 30-pack-year history of smoking and currently smoke or have quit within the past 15 years. Fecal occult blood test (FOBT) of the stool. You may have this test every year starting at age 20. Flexible  sigmoidoscopy or colonoscopy. You may have a sigmoidoscopy every 5 years or a colonoscopy every 10 years starting at age 9. Hepatitis C blood test. Hepatitis B blood test. Sexually transmitted disease (STD) testing. Diabetes screening. This is done by checking your blood sugar (glucose) after you have not eaten for a while (fasting). You may have this done every 1-3 years. Bone density scan. This is done to screen for osteoporosis. You may have this done  starting at age 50. Mammogram. This may be done every 1-2 years. Talk to your health care provider about how often you should have regular mammograms. Talk with your health care provider about your test results, treatment options, and if necessary, the need for more tests. Vaccines  Your health care provider may recommend certain vaccines, such as: Influenza vaccine. This is recommended every year. Tetanus, diphtheria, and acellular pertussis (Tdap, Td) vaccine. You may need a Td booster every 10 years. Zoster vaccine. You may need this after age 77. Pneumococcal 13-valent conjugate (PCV13) vaccine. One dose is recommended after age 66. Pneumococcal polysaccharide (PPSV23) vaccine. One dose is recommended after age 7. Talk to your health care provider about which screenings and vaccines you need and how often you need them. This information is not intended to replace advice given to you by your health care provider. Make sure you discuss any questions you have with your health care provider. Document Released: 04/20/2015 Document Revised: 12/12/2015 Document Reviewed: 01/23/2015 Elsevier Interactive Patient Education  2017 [place] Prevention in the Home Falls can cause injuries. They can happen to people of all ages. There are many things you can do to make your home safe and to help prevent falls. What can I do on the outside of my home? Regularly fix the edges of walkways and driveways and fix any cracks. Remove anything that might make you trip as you walk through a door, such as a raised step or threshold. Trim any bushes or trees on the path to your home. Use bright outdoor lighting. Clear any walking paths of anything that might make someone trip, such as rocks or tools. Regularly check to see if handrails are loose or broken. Make sure that both sides of any steps have handrails. Any raised decks and porches should have guardrails on the edges. Have any leaves, snow, or  ice cleared regularly. Use sand or salt on walking paths during winter. Clean up any spills in your garage right away. This includes oil or grease spills. What can I do in the bathroom? Use night lights. Install grab bars by the toilet and in the tub and shower. Do not use towel bars as grab bars. Use non-skid mats or decals in the tub or shower. If you need to sit down in the shower, use a plastic, non-slip stool. Keep the floor dry. Clean up any water that spills on the floor as soon as it happens. Remove soap buildup in the tub or shower regularly. Attach bath mats securely with double-sided non-slip rug tape. Do not have throw rugs and other things on the floor that can make you trip. What can I do in the bedroom? Use night lights. Make sure that you have a light by your bed that is easy to reach. Do not use any sheets or blankets that are too big for your bed. They should not hang down onto the floor. Have a firm chair that has side arms. You can use this for support while you get dressed.  Do not have throw rugs and other things on the floor that can make you trip. What can I do in the kitchen? Clean up any spills right away. Avoid walking on wet floors. Keep items that you use a lot in easy-to-reach places. If you need to reach something above you, use a strong step stool that has a grab bar. Keep electrical cords out of the way. Do not use floor polish or wax that makes floors slippery. If you must use wax, use non-skid floor wax. Do not have throw rugs and other things on the floor that can make you trip. What can I do with my stairs? Do not leave any items on the stairs. Make sure that there are handrails on both sides of the stairs and use them. Fix handrails that are broken or loose. Make sure that handrails are as long as the stairways. Check any carpeting to make sure that it is firmly attached to the stairs. Fix any carpet that is loose or worn. Avoid having throw rugs at  the top or bottom of the stairs. If you do have throw rugs, attach them to the floor with carpet tape. Make sure that you have a light switch at the top of the stairs and the bottom of the stairs. If you do not have them, ask someone to add them for you. What else can I do to help prevent falls? Wear shoes that: Do not have high heels. Have rubber bottoms. Are comfortable and fit you well. Are closed at the toe. Do not wear sandals. If you use a stepladder: Make sure that it is fully opened. Do not climb a closed stepladder. Make sure that both sides of the stepladder are locked into place. Ask someone to hold it for you, if possible. Clearly mark and make sure that you can see: Any grab bars or handrails. First and last steps. Where the edge of each step is. Use tools that help you move around (mobility aids) if they are needed. These include: Canes. Walkers. Scooters. Crutches. Turn on the lights when you go into a dark area. Replace any light bulbs as soon as they burn out. Set up your furniture so you have a clear path. Avoid moving your furniture around. If any of your floors are uneven, fix them. If there are any pets around you, be aware of where they are. Review your medicines with your doctor. Some medicines can make you feel dizzy. This can increase your chance of falling. Ask your doctor what other things that you can do to help prevent falls. This information is not intended to replace advice given to you by your health care provider. Make sure you discuss any questions you have with your health care provider. Document Released: 01/18/2009 Document Revised: 08/30/2015 Document Reviewed: 04/28/2014 Elsevier Interactive Patient Education  2017 [employer].

## 2022-08-14 ENCOUNTER — Other Ambulatory Visit: Payer: Self-pay | Admitting: Physician Assistant

## 2022-08-14 ENCOUNTER — Telehealth: Payer: Self-pay | Admitting: Physician Assistant

## 2022-08-14 LAB — URINE CULTURE
MICRO NUMBER:: 14917620
SPECIMEN QUALITY:: ADEQUATE

## 2022-08-14 MED ORDER — SULFAMETHOXAZOLE-TRIMETHOPRIM 800-160 MG PO TABS: 1.0000 | ORAL_TABLET | Freq: Two times a day (BID) | ORAL | 0 refills | Status: AC

## 2022-08-14 NOTE — Telephone Encounter (Signed)
Patient states [facility] no longer uses [facility] [address]. New [facility]erred [phone] [place], [place] - [address] Phone: [phone]  Fax: [phone]   The above Pharmacy has been put in Preferred Pharmacy-requests all future RX's sent after 08/15/22 be sent to the above.

## 2022-08-14 NOTE — Telephone Encounter (Signed)
Noted thanks you for [facility]

## 2022-08-19 ENCOUNTER — Other Ambulatory Visit: Payer: Self-pay

## 2022-08-19 ENCOUNTER — Emergency Department (HOSPITAL_BASED_OUTPATIENT_CLINIC_OR_DEPARTMENT_OTHER): Payer: 59

## 2022-08-19 ENCOUNTER — Encounter (HOSPITAL_BASED_OUTPATIENT_CLINIC_OR_DEPARTMENT_OTHER): Payer: Self-pay

## 2022-08-19 ENCOUNTER — Emergency Department (HOSPITAL_BASED_OUTPATIENT_CLINIC_OR_DEPARTMENT_OTHER)
Admission: EM | Admit: 2022-08-19 | Discharge: 2022-08-19 | Disposition: A | Discharge status: Home / Self Care | Payer: 59 | Attending: Emergency Medicine | Admitting: Emergency Medicine

## 2022-08-19 ENCOUNTER — Ambulatory Visit (INDEPENDENT_AMBULATORY_CARE_PROVIDER_SITE_OTHER): Payer: 59 | Admitting: Physician Assistant

## 2022-08-19 VITALS — BP 112/64 | HR 73 | Temp 97.5°F | Ht 63.0 in | Wt 239.2 lb

## 2022-08-19 DIAGNOSIS — T370X5A Adverse effect of sulfonamides, initial encounter: Secondary | ICD-10-CM | POA: Diagnosis not present

## 2022-08-19 DIAGNOSIS — E119 Type 2 diabetes mellitus without complications: Secondary | ICD-10-CM | POA: Diagnosis not present

## 2022-08-19 DIAGNOSIS — N3000 Acute cystitis without hematuria: Secondary | ICD-10-CM | POA: Insufficient documentation

## 2022-08-19 DIAGNOSIS — R635 Abnormal weight gain: Secondary | ICD-10-CM

## 2022-08-19 DIAGNOSIS — M7989 Other specified soft tissue disorders: Secondary | ICD-10-CM | POA: Insufficient documentation

## 2022-08-19 DIAGNOSIS — R600 Localized edema: Secondary | ICD-10-CM | POA: Insufficient documentation

## 2022-08-19 DIAGNOSIS — N390 Urinary tract infection, site not specified: Secondary | ICD-10-CM | POA: Diagnosis not present

## 2022-08-19 DIAGNOSIS — B9689 Other specified bacterial agents as the cause of diseases classified elsewhere: Secondary | ICD-10-CM

## 2022-08-19 DIAGNOSIS — R3989 Other symptoms and signs involving the genitourinary system: Secondary | ICD-10-CM

## 2022-08-19 DIAGNOSIS — I10 Essential (primary) hypertension: Secondary | ICD-10-CM | POA: Diagnosis not present

## 2022-08-19 LAB — COMPREHENSIVE METABOLIC PANEL
ALT: 13 U/L (ref 0–44)
AST: 14 U/L — ABNORMAL LOW (ref 15–41)
Albumin: 3.9 g/dL (ref 3.5–5.0)
Alkaline Phosphatase: 109 U/L (ref 38–126)
Anion gap: 10 (ref 5–15)
BUN: 36 mg/dL — ABNORMAL HIGH (ref 8–23)
CO2: 25 mmol/L (ref 22–32)
Calcium: 8.9 mg/dL (ref 8.9–10.3)
Chloride: 107 mmol/L (ref 98–111)
Creatinine, Ser: 1.82 mg/dL — ABNORMAL HIGH (ref 0.44–1.00)
GFR, Estimated: 30 mL/min — ABNORMAL LOW (ref >60)
Glucose, Bld: 122 mg/dL — ABNORMAL HIGH (ref 70–99)
Potassium: 4.3 mmol/L (ref 3.5–5.1)
Sodium: 142 mmol/L (ref 135–145)
Total Bilirubin: 0.5 mg/dL (ref 0.3–1.2)
Total Protein: 6.4 g/dL — ABNORMAL LOW (ref 6.5–8.1)

## 2022-08-19 LAB — CBC
HCT: 37.1 % (ref 36.0–46.0)
Hemoglobin: 11.6 g/dL — ABNORMAL LOW (ref 12.0–15.0)
MCH: 30 pg (ref 26.0–34.0)
MCHC: 31.3 g/dL (ref 30.0–36.0)
MCV: 95.9 fL (ref 80.0–100.0)
Platelets: 280 10*3/uL (ref 150–400)
RBC: 3.87 MIL/uL (ref 3.87–5.11)
RDW: 14.3 % (ref 11.5–15.5)
WBC: 9.8 10*3/uL (ref 4.0–10.5)
nRBC: 0 % (ref 0.0–0.2)

## 2022-08-19 LAB — TROPONIN I (HIGH SENSITIVITY): Troponin I (High Sensitivity): 8 ng/L (ref <18)

## 2022-08-19 LAB — BRAIN NATRIURETIC PEPTIDE: B Natriuretic Peptide: 387.9 pg/mL — ABNORMAL HIGH (ref 0.0–100.0)

## 2022-08-19 MED ORDER — AMOXICILLIN-POT CLAVULANATE 875-125 MG PO TABS: 1.0000 | ORAL_TABLET | Freq: Two times a day (BID) | ORAL | 0 refills | Status: DC
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
## 2022-08-19 NOTE — ED Triage Notes (Addendum)
Pt advised she is coming from PCP, gained 12lbs in 1wk, BLE swelling onset 2 days ago. States she had "a severe reaction to abx for UTI, stopped taking 2 days ago, so my urine is probably still bad." Reports compliance w all home medication, including lasix "but I haven't been peeing much."  PCP advised that she's concerned for CHF, "that all of this is happening at once."

## 2022-08-19 NOTE — Patient Instructions (Addendum)
Please go to [facility] Emergency Dept on [address] at this time.  I am concerned about your new RLE edema, 12 lb weight gain in 8 days, ongoing urinary symptoms, & new sulfa reaction. You need quick evaluation in the ED at this time because of your high risk medical status.

## 2022-08-19 NOTE — Discharge Instructions (Signed)
Please pick up the antibiotic I prescribed for you for your UTI.  Today your labs and imaging were all reassuring however please follow-up with your primary care provider as symptoms may change.  Please continue take your Lasix daily and monitor your symptoms.  If symptoms worsen please return to ER.

## 2022-08-19 NOTE — ED Provider Notes (Signed)
[place] EMERGENCY DEPARTMENT AT [facility] Provider Note   CSN: [id number] Arrival date & time: 08/19/22  1203     History  Chief Complaint  Patient presents with   Leg Swelling   Urinary Tract Infection    Camerin Ewan is a 67 y.o. female history of atherosclerosis, PAD, hypertension diabetes, left below the knee amputation, excising fasciitis, AKI presented for UTI and right leg swelling.  Patient states she was put on Bactrim 3 days ago for her UTI and immediately began having shortness of breath and throat swelling up.  The symptoms have resolved since patient stopped taking the medication and patient went to go see primary care today and then referred to ER due to leg swelling.  Patient states that she does not have history of heart failure but does take Lasix daily.  Patient states she has gained 12 pounds in the past 8 days.  Patient states she is able to urinate without issue and does not endorse any dysuria but says her primary care provider said that her urine came back positive for bacteria.  Patient states she still sensation and motor skills distally.  Patient denied any chest pain, shortness of breath, nausea/vomiting, recent travel/hospitalization/surgeries, previous blood clots, blood thinners, personal history of cancer, hemoptysis, skin color changes  Home Medications Prior to Admission medications   Medication Sig Start Date End Date Taking? Authorizing Provider  amoxicillin-clavulanate (AUGMENTIN) 875-125 MG tablet Take 1 tablet by mouth every 12 (twelve) hours. 08/19/22  Yes Geanine Vandekamp, Fayrene Fearing T, PA-C  amLODipine (NORVASC) 10 MG tablet TAKE 1 TABLET BY MOUTH DAILY 06/16/22   Allwardt, Alyssa M, PA-C  atorvastatin (LIPITOR) 40 MG tablet TAKE 1 TABLET BY MOUTH DAILY 06/16/22   Allwardt, Alyssa M, PA-C  blood glucose meter kit and supplies KIT Dispense based on patient and insurance preference. Use up to four times daily as directed. (FOR ICD-9 250.00, 250.01).  05/27/21   Allwardt, Alyssa M, PA-C  Calcium Carbonate Antacid (CALCIUM CARBONATE, DOSED IN MG ELEMENTAL CALCIUM,) 1250 MG/5ML SUSP Take 5 mLs (500 mg of elemental calcium total) by mouth every 6 (six) hours as needed for indigestion. 10/04/20   Arnetha Courser, MD  carvedilol (COREG) 25 MG tablet TAKE 1 TABLET BY MOUTH TWICE  DAILY 06/16/22   Allwardt, Crist Infante, PA-C  Cholecalciferol 25 MCG (1000 UT) tablet Take 1,000 Units by mouth daily.    [provider]  Cyanocobalamin (VITAMIN B 12 PO) Take by mouth.    [provider]  diclofenac Sodium (VOLTAREN ARTHRITIS PAIN) 1 % GEL Apply 2 g topically 4 (four) times daily. 02/10/22   Allwardt, Crist Infante, PA-C  ezetimibe (ZETIA) 10 MG tablet TAKE 1 TABLET BY MOUTH DAILY 06/16/22   Allwardt, Alyssa M, PA-C  ferrous sulfate 325 (65 FE) MG EC tablet Take 325 mg by mouth 3 (three) times daily with meals.    [provider]  furosemide (LASIX) 40 MG tablet TAKE 1 TABLET BY MOUTH DAILY ON  MONDAY, WEDNESDAY,FRIDAY,  SATURDAY AND SUNDAY.(ALTERNATE  WITH 20MG  TABLET) 07/30/22   Allwardt, Alyssa M, PA-C  glucose blood (ACCU-CHEK GUIDE) test strip USE 1 STRIP TO CHECK BLOOD  GLUCOSE 3 TIMES DAILY 11/20/21   Allwardt, Alyssa M, PA-C  glucose monitoring kit (FREESTYLE) monitoring kit 1 each by Does not apply route 4 (four) times daily - after meals and at bedtime. 1 month Diabetic Testing Supplies for QAC-QHS accuchecks. Any brand OK. Diagnosis E11.65 02/07/19   Olive Bass, FNP  HUMALOG  MIX 75/25 KWIKPEN (75-25) 100 UNIT/ML KwikPen INJECT 30 UNITS INTO THE SKIN  TWICE DAILY 08/04/22   Allwardt, Alyssa M, PA-C  hydrALAZINE (APRESOLINE) 50 MG tablet Take 1 tablet (50 mg total) by mouth 3 (three) times daily. 11/20/21   Allwardt, Crist Infante, PA-C  linaclotide (LINZESS) 145 MCG CAPS capsule Take 1 capsule (145 mcg total) by mouth daily before breakfast. 05/13/22   Allwardt, Crist Infante, PA-C  oxyCODONE-acetaminophen (PERCOCET/ROXICET) 5-325 MG tablet  Take 1 tablet by mouth every 8 (eight) hours as needed for severe pain.    [provider]  potassium chloride SA (KLOR-CON M) 20 MEQ tablet TAKE 1 TABLET BY MOUTH DAILY ON  MONDAY, WEDNESDAY, FRIDAY,  SATURDAY, AND SUNDAY Patient taking differently: daily. 06/11/22   Allwardt, Crist Infante, PA-C  pregabalin (LYRICA) 75 MG capsule TAKE 1 CAPSULE BY MOUTH TWICE  DAILY 06/06/22   Allwardt, Alyssa M, PA-C  RYBELSUS 3 MG TABS Take 1 tablet by mouth daily. 10/15/21   [provider]  sulfamethoxazole-trimethoprim (BACTRIM DS) 800-160 MG tablet Take 1 tablet by mouth 2 (two) times daily for 7 days. 08/14/22 08/21/22  Allwardt, Crist Infante, PA-C      Allergies    Losartan, Morphine and related, and Sulfa antibiotics    Review of Systems   Review of Systems See HPI Physical Exam Updated Vital Signs BP (!) 152/61   Pulse 72   Temp 98.2 F (36.8 C)   Resp 16   SpO2 99%  Physical Exam Vitals reviewed.  Constitutional:      General: She is not in acute distress. HENT:     Head: Normocephalic and atraumatic.  Eyes:     Extraocular Movements: Extraocular movements intact.     Conjunctiva/sclera: Conjunctivae normal.     Pupils: Pupils are equal, round, and reactive to light.  Cardiovascular:     Rate and Rhythm: Normal rate and regular rhythm.     Pulses: Normal pulses.     Heart sounds: Normal heart sounds.     Comments: 2+ bilateral radial/R dorsalis pedis pulses with regular rate Pulmonary:     Effort: Pulmonary effort is normal. No respiratory distress.     Breath sounds: Normal breath sounds.  Abdominal:     Palpations: Abdomen is soft.     Tenderness: There is no abdominal tenderness. There is no guarding or rebound.  Musculoskeletal:        General: Normal range of motion.     Cervical back: Normal range of motion and neck supple.     Comments: 5 out of 5 bilateral grip/R leg extension strength 1+ pitting right leg edema with tenderness to calf Amputation noted to left leg  below the knee  Skin:    General: Skin is warm and dry.     Capillary Refill: Capillary refill takes less than 2 seconds.     Comments: No overlying skin color changes  Neurological:     General: No focal deficit present.     Mental Status: She is alert and oriented to person, place, and time.     Comments: Sensation intact in all 3 limbs  Psychiatric:        Mood and Affect: Mood normal.     ED Results / Procedures / Treatments   Labs (all labs ordered are listed, but only abnormal results are displayed) Labs Reviewed  CBC - Abnormal; Notable for the following components:      Result Value   Hemoglobin 11.6 (*)  All other components within normal limits  COMPREHENSIVE METABOLIC PANEL - Abnormal; Notable for the following components:   Glucose, Bld 122 (*)    BUN 36 (*)    Creatinine, Ser 1.82 (*)    Total Protein 6.4 (*)    AST 14 (*)    GFR, Estimated 30 (*)    All other components within normal limits  BRAIN NATRIURETIC PEPTIDE - Abnormal; Notable for the following components:   B Natriuretic Peptide 387.9 (*)    All other components within normal limits  TROPONIN I (HIGH SENSITIVITY)  TROPONIN I (HIGH SENSITIVITY)    EKG None  Radiology US Venous Img Lower Right (DVT Study)  Result Date: 08/19/2022 CLINICAL DATA:  Leg swelling and shortness of breath EXAM: Right LOWER EXTREMITY VENOUS DOPPLER ULTRASOUND TECHNIQUE: Gray-scale sonography with compression, as well as color and duplex ultrasound, were performed to evaluate the deep venous system(s) from the level of the common femoral vein through the popliteal and proximal calf veins. COMPARISON:  None Available. FINDINGS: VENOUS Normal compressibility of the common femoral, superficial femoral, and popliteal veins, as well as the visualized calf veins. Visualized portions of profunda femoral vein and great saphenous vein unremarkable. No filling defects to suggest DVT on grayscale or color Doppler imaging. Doppler  waveforms show normal direction of venous flow, normal respiratory plasticity and response to augmentation. Limited views of the contralateral common femoral vein are unremarkable. OTHER None. Limitations: Overlapping soft tissue. Portions of the calf veins are incompletely seen IMPRESSION: No evidence of right lower extremity DVT Electronically Signed   By: Karen Kays M.D.   On: 08/19/2022 14:21   DG Chest Port 1 View  Result Date: 08/19/2022 CLINICAL DATA:  Leg swelling, shortness of breath. EXAM: PORTABLE CHEST 1 VIEW COMPARISON:  Chest radiograph dated February 13, 2021 FINDINGS: The heart size and mediastinal contours are within normal limits. Both lungs are clear. The visualized skeletal structures are unremarkable. IMPRESSION: No active disease. Electronically Signed   By: Larose Hires D.O.   On: 08/19/2022 13:43    Procedures Procedures    Medications Ordered in ED Medications - No data to display  ED Course/ Medical Decision Making/ A&P                             Medical Decision Making Amount and/or Complexity of Data Reviewed Labs: ordered. Radiology: ordered.  Risk Prescription drug management.   [name] 67 y.o. presented today for right leg swelling and UTI. Working DDx that I considered at this time includes, but not limited to, UTI, CHF, DVT, dependent edema,  PE, pleural effusion.  R/o DDx: CHF, DVT, dependent edema,  PE, pleural effusion: These are considered less likely due to history of present illness and physical exam findings  Review of prior external notes: 08/19/2022 office visit  Unique Tests and My Interpretation:  CMP: BUN 36, creatinine 1.82 both similar to previous readings; GFR 30 CBC: Unremarkable Troponin: 8 BNP: 3-7.9, lower than previous readings Chest x-ray: No cardiopulmonary changes Right leg DVT study: EKG:  Discussion with Independent Historian:  Son  Discussion of Management of Tests: None  Risk: Medium: prescription drug  management  Risk Stratification Score: none  Staffed with Rhunette Croft, MD  Plan: Patient presented for UTI and right leg swelling. On exam patient was in no acute distress and stable vitals.  Patient did have 1+ right-sided pitting edema with good pulses motor sensation distally.  Patient  was tender in the posterior calf and so ultrasound be ordered to rule out DVT.  BNP was also ordered to evaluate for possible CHF diagnosis.  Triage note stated the patient was short of breath however patient did not have shortness of breath with me but a chest x-ray will be ordered as patient endorses 12 pound weight gain in the past 8 days to evaluate for possible pleural effusions.  Patient told me she has not had any urinary symptoms however the triage note states that she has not been picking that much even though she is taking Lasix daily and so a bladder scan will be ordered.  Upon chart review patient did grow Klebsiella in her urine but reactive to sulfa medication.  Patient states she is able to take penicillins and plan on treating her UTI with Augmentin.  Patient's labs came back reassuring.  Patient's BNP is 3-7.9 however this is lower than previous results.  Patient CMP is also similar to previous results as well with a decreased GFR of 30.  Patient's leg swelling could be secondary to decreased renal function however still awaiting ultrasound.  Patient's ultrasound was negative.  Patient stated that she has been able to urinate today without issue and so bladder scan was canceled.  Patient be given Augmentin as the Klebsiella is susceptible to this and encouraged to follow-up with her primary care provider.  Patient was given return precautions. Patient stable for discharge at this time.  Patient verbalized understanding of plan.         Final Clinical Impression(s) / ED Diagnoses Final diagnoses:  Leg swelling  Acute cystitis without hematuria    Rx / DC Orders ED Discharge Orders           Ordered    amoxicillin-clavulanate (AUGMENTIN) 875-125 MG tablet  Every 12 hours        08/19/22 1458              Netta Corrigan, PA-C 08/19/22 1501    Derwood Kaplan, MD 08/20/22 1112

## 2022-08-20 ENCOUNTER — Ambulatory Visit (INDEPENDENT_AMBULATORY_CARE_PROVIDER_SITE_OTHER): Payer: 59 | Admitting: Podiatry

## 2022-08-20 ENCOUNTER — Encounter: Payer: Self-pay | Admitting: Podiatry

## 2022-08-20 DIAGNOSIS — Z794 Long term (current) use of insulin: Secondary | ICD-10-CM | POA: Diagnosis not present

## 2022-08-20 DIAGNOSIS — L84 Corns and callosities: Secondary | ICD-10-CM

## 2022-08-20 DIAGNOSIS — B351 Tinea unguium: Secondary | ICD-10-CM

## 2022-08-20 DIAGNOSIS — M79674 Pain in right toe(s): Secondary | ICD-10-CM

## 2022-08-20 DIAGNOSIS — M79675 Pain in left toe(s): Secondary | ICD-10-CM

## 2022-08-20 DIAGNOSIS — Z899 Acquired absence of limb, unspecified: Secondary | ICD-10-CM | POA: Diagnosis not present

## 2022-08-20 DIAGNOSIS — Z89512 Acquired absence of left leg below knee: Secondary | ICD-10-CM

## 2022-08-20 DIAGNOSIS — E1140 Type 2 diabetes mellitus with diabetic neuropathy, unspecified: Secondary | ICD-10-CM | POA: Diagnosis not present

## 2022-08-20 NOTE — Progress Notes (Signed)
  Subjective:  Patient ID: Shirley Chen, female    DOB: 08/20/1955,   MRN: [id number]  Chief Complaint  Patient presents with   Diabetic foot care     67 y.o. female presents for concern of thickened elongated and painful nails that are difficult to trim. Requesting to have them trimmed today. Relates burning and tingling in their feet. History of right hallux amputation and left BKA. Patient is diabetic and last A1c was  Lab Results  Component Value Date   HGBA1C 8.4 (H) 08/11/2022   .   PCP:  Allwardt, Crist Infante, PA-C    . Denies any other pedal complaints. Denies n/v/f/c.   Past Medical History:  Diagnosis Date   Arthritis    "joints in hands and right leg ache" (10/20/2014)   Cellulitis of right foot 10/2013   Constipation    Diabetes mellitus without complication (HCC)    Type II   Headache    "maybe weekly" (10/20/2014)   Hyperlipidemia    Hypertension    Tobacco abuse    UTI (lower urinary tract infection)     Objective:  Physical Exam: Vascular: DP/PT pulses 2/4 bilateral. CFT <3 seconds. Absent hair growth on digits. Edema noted to bilateral lower extremities. Xerosis noted bilaterally.  Skin. No lacerations or abrasions bilateral feet. Nails 1- on right are thickened discolored and elongated with subungual debris. Hyperkeartotic lesion noted plantar first metatarsal on right.  Musculoskeletal: MMT 5/5 bilateral lower extremities in DF, PF, Inversion and Eversion. Deceased ROM in DF of ankle joint. BKA on left and right hallux amputation.  Neurological: Sensation intact to light touch. Protective sensation diminished bilateral.    Assessment:   1. Type 2 diabetes mellitus with diabetic neuropathy, with long-term current use of insulin (HCC)   2. Pain due to onychomycosis of toenails of both feet   3. History of amputation   4. History of amputation below knee, left (HCC)   5. Callus of foot      Plan:  Patient was evaluated and treated and all questions  answered. -Discussed and educated patient on diabetic foot care, especially with  regards to the vascular, neurological and musculoskeletal systems.  -Stressed the importance of good glycemic control and the detriment of not  controlling glucose levels in relation to the foot. -Discussed supportive shoes at all times and checking feet regularly.  -Mechanically debrided all nails 1-4 right using sterile nail nipper and filed with dremel without incident  -Hyperkeratotic tissue debrided without incident.  -Discussed DM shoes and will get fitted.  -Answered all patient questions -Patient to return  in 3 months for at risk foot care -Patient advised to call the office if any problems or questions arise in the meantime.   Louann Sjogren, DPM

## 2022-08-29 ENCOUNTER — Ambulatory Visit: Payer: 59 | Admitting: Physician Assistant

## 2022-09-22 ENCOUNTER — Other Ambulatory Visit: Payer: 59

## 2022-10-17 LAB — HM DIABETES EYE EXAM

## 2022-10-27 ENCOUNTER — Other Ambulatory Visit: Payer: Self-pay | Admitting: Physician Assistant

## 2022-10-27 DIAGNOSIS — I10 Essential (primary) hypertension: Secondary | ICD-10-CM

## 2022-11-05 ENCOUNTER — Emergency Department (HOSPITAL_BASED_OUTPATIENT_CLINIC_OR_DEPARTMENT_OTHER)
Admission: EM | Admit: 2022-11-05 | Discharge: 2022-11-05 | Disposition: A | Discharge status: Home / Self Care | Payer: 59 | Attending: Emergency Medicine | Admitting: Emergency Medicine

## 2022-11-05 ENCOUNTER — Other Ambulatory Visit: Payer: Self-pay

## 2022-11-05 ENCOUNTER — Encounter (HOSPITAL_BASED_OUTPATIENT_CLINIC_OR_DEPARTMENT_OTHER): Payer: Self-pay | Admitting: Emergency Medicine

## 2022-11-05 DIAGNOSIS — S79912A Unspecified injury of left hip, initial encounter: Secondary | ICD-10-CM | POA: Diagnosis present

## 2022-11-05 DIAGNOSIS — E119 Type 2 diabetes mellitus without complications: Secondary | ICD-10-CM | POA: Diagnosis not present

## 2022-11-05 DIAGNOSIS — S7002XA Contusion of left hip, initial encounter: Secondary | ICD-10-CM | POA: Insufficient documentation

## 2022-11-05 DIAGNOSIS — X58XXXA Exposure to other specified factors, initial encounter: Secondary | ICD-10-CM | POA: Insufficient documentation

## 2022-11-05 DIAGNOSIS — D72829 Elevated white blood cell count, unspecified: Secondary | ICD-10-CM | POA: Insufficient documentation

## 2022-11-05 DIAGNOSIS — Z794 Long term (current) use of insulin: Secondary | ICD-10-CM | POA: Diagnosis not present

## 2022-11-05 DIAGNOSIS — M545 Low back pain, unspecified: Secondary | ICD-10-CM | POA: Insufficient documentation

## 2022-11-05 DIAGNOSIS — M7918 Myalgia, other site: Secondary | ICD-10-CM

## 2022-11-05 LAB — COMPREHENSIVE METABOLIC PANEL
ALT: 8 U/L (ref 0–44)
AST: 13 U/L — ABNORMAL LOW (ref 15–41)
Albumin: 3.9 g/dL (ref 3.5–5.0)
Alkaline Phosphatase: 107 U/L (ref 38–126)
Anion gap: 13 (ref 5–15)
BUN: 35 mg/dL — ABNORMAL HIGH (ref 8–23)
CO2: 25 mmol/L (ref 22–32)
Calcium: 9.2 mg/dL (ref 8.9–10.3)
Chloride: 105 mmol/L (ref 98–111)
Creatinine, Ser: 1.82 mg/dL — ABNORMAL HIGH (ref 0.44–1.00)
GFR, Estimated: 30 mL/min — ABNORMAL LOW (ref >60)
Glucose, Bld: 126 mg/dL — ABNORMAL HIGH (ref 70–99)
Potassium: 4.5 mmol/L (ref 3.5–5.1)
Sodium: 143 mmol/L (ref 135–145)
Total Bilirubin: 0.7 mg/dL (ref 0.3–1.2)
Total Protein: 6.6 g/dL (ref 6.5–8.1)

## 2022-11-05 LAB — CBC WITH DIFFERENTIAL/PLATELET
Abs Immature Granulocytes: 0.1 10*3/uL — ABNORMAL HIGH (ref 0.00–0.07)
Basophils Absolute: 0 10*3/uL (ref 0.0–0.1)
Basophils Relative: 0 %
Eosinophils Absolute: 0.3 10*3/uL (ref 0.0–0.5)
Eosinophils Relative: 2 %
HCT: 39.2 % (ref 36.0–46.0)
Hemoglobin: 12.8 g/dL (ref 12.0–15.0)
Immature Granulocytes: 1 %
Lymphocytes Relative: 16 %
Lymphs Abs: 2.3 10*3/uL (ref 0.7–4.0)
MCH: 30 pg (ref 26.0–34.0)
MCHC: 32.7 g/dL (ref 30.0–36.0)
MCV: 92 fL (ref 80.0–100.0)
Monocytes Absolute: 0.9 10*3/uL (ref 0.1–1.0)
Monocytes Relative: 7 %
Neutro Abs: 10.4 10*3/uL — ABNORMAL HIGH (ref 1.7–7.7)
Neutrophils Relative %: 74 %
Platelets: 276 10*3/uL (ref 150–400)
RBC: 4.26 MIL/uL (ref 3.87–5.11)
RDW: 13.4 % (ref 11.5–15.5)
WBC: 14.1 10*3/uL — ABNORMAL HIGH (ref 4.0–10.5)
nRBC: 0 % (ref 0.0–0.2)

## 2022-11-05 MED ORDER — METHYLPREDNISOLONE 4 MG PO TBPK: ORAL_TABLET | ORAL | 0 refills | Status: DC

## 2022-11-05 MED ORDER — OXYCODONE-ACETAMINOPHEN 5-325 MG PO TABS
1.0000 | ORAL_TABLET | Freq: Once | ORAL | Status: AC
  Administered 2022-11-05: 1 via ORAL
  Filled 2022-11-05: qty 1

## 2022-11-05 NOTE — ED Triage Notes (Signed)
Pt via pov from home with sciatica pain x 2 months; worsening to "unbearable" in last 2 days. Pt reports that she has taken 4 oxycodone today with no relief; wants a cortisone shot or surgery. Pt alert & oriented, tearful and moaning in triage.

## 2022-11-05 NOTE — Discharge Instructions (Addendum)
You have been prescribed a Medrol Dosepak.  Please pick this up at the pharmacy and start taking it as directed.   Please call the number listed below to schedule appointment with the neurosurgery and spine team.  Please follow-up with your primary care provider and pain management team to further discuss your pain.  Please return to the ER if you develop any loss of bowel or bladder control, fever, chills, numbness in your leg, any other new or concerning symptoms.

## 2022-11-05 NOTE — ED Provider Notes (Addendum)
[place] EMERGENCY DEPARTMENT AT [facility] Provider Note   CSN: [id number] Arrival date & time: 11/05/22  1321     History  Chief Complaint  Patient presents with   Back Pain    Tancy Markland is a 67 y.o. female with history of type 2 diabetes, BKA of the left lower extremity, chronic left buttock/LLE pain followed by pain management, presents with concern for worsening left-sided buttock pain that extends into her left lower limb.  She has this pain at baseline but it has been worsening over the past couple of days.  Pain is a constant sharp stabbing pain.  Worse when she is laying on her bottom, better, weight is off of her bottom.  Denies any numbness or tingling.  Denies any injury to the limb.  Denies any fevers, chills, loss of bowel or bladder control, IV drug use, history of malignancy.  She is normally on oxycodone for pain control, but states her oxycodone is no longer controlling her pain adequately today.    Back Pain      Home Medications Prior to Admission medications   Medication Sig Start Date End Date Taking? Authorizing Provider  methylPREDNISolone (MEDROL DOSEPAK) 4 MG TBPK tablet Take as directed on labeling 11/05/22  Yes Rondel Baton, MD  ACCU-CHEK GUIDE test strip USE 1 STRIP TO CHECK BLOOD  GLUCOSE 3 TIMES DAILY 10/29/22   Allwardt, Alyssa M, PA-C  amLODipine (NORVASC) 10 MG tablet TAKE 1 TABLET BY MOUTH DAILY 06/16/22   Allwardt, Alyssa M, PA-C  amoxicillin-clavulanate (AUGMENTIN) 875-125 MG tablet Take 1 tablet by mouth every 12 (twelve) hours. 08/19/22   Netta Corrigan, PA-C  atorvastatin (LIPITOR) 40 MG tablet TAKE 1 TABLET BY MOUTH DAILY 06/16/22   Allwardt, Crist Infante, PA-C  blood glucose meter kit and supplies KIT Dispense based on patient and insurance preference. Use up to four times daily as directed. (FOR ICD-9 250.00, 250.01). 05/27/21   Allwardt, Alyssa M, PA-C  Calcium Carbonate Antacid (CALCIUM CARBONATE, DOSED IN MG ELEMENTAL  CALCIUM,) 1250 MG/5ML SUSP Take 5 mLs (500 mg of elemental calcium total) by mouth every 6 (six) hours as needed for indigestion. 10/04/20   Arnetha Courser, MD  carvedilol (COREG) 25 MG tablet TAKE 1 TABLET BY MOUTH TWICE  DAILY 06/16/22   Allwardt, Crist Infante, PA-C  Cholecalciferol 25 MCG (1000 UT) tablet Take 1,000 Units by mouth daily.    [provider]  Cyanocobalamin (VITAMIN B 12 PO) Take by mouth.    [provider]  diclofenac Sodium (VOLTAREN ARTHRITIS PAIN) 1 % GEL Apply 2 g topically 4 (four) times daily. 02/10/22   Allwardt, Crist Infante, PA-C  ezetimibe (ZETIA) 10 MG tablet TAKE 1 TABLET BY MOUTH DAILY 06/16/22   Allwardt, Alyssa M, PA-C  ferrous sulfate 325 (65 FE) MG EC tablet Take 325 mg by mouth 3 (three) times daily with meals.    [provider]  furosemide (LASIX) 40 MG tablet TAKE 1 TABLET BY MOUTH DAILY ON  MONDAY, WEDNESDAY,FRIDAY,  SATURDAY AND SUNDAY.(ALTERNATE  WITH 20MG  TABLET) 07/30/22   Allwardt, Alyssa M, PA-C  glucose monitoring kit (FREESTYLE) monitoring kit 1 each by Does not apply route 4 (four) times daily - after meals and at bedtime. 1 month Diabetic Testing Supplies for QAC-QHS accuchecks. Any brand OK. Diagnosis E11.65 02/07/19   Olive Bass, FNP  HUMALOG MIX 75/25 KWIKPEN (75-25) 100 UNIT/ML KwikPen INJECT 30 UNITS INTO THE SKIN  TWICE DAILY 08/04/22   Allwardt, Arlyss Repress  M, PA-C  hydrALAZINE (APRESOLINE) 50 MG tablet TAKE 1 TABLET BY MOUTH 3 TIMES  DAILY 10/29/22   Allwardt, Crist Infante, PA-C  linaclotide (LINZESS) 145 MCG CAPS capsule Take 1 capsule (145 mcg total) by mouth daily before breakfast. 05/13/22   Allwardt, Crist Infante, PA-C  oxyCODONE-acetaminophen (PERCOCET/ROXICET) 5-325 MG tablet Take 1 tablet by mouth every 8 (eight) hours as needed for severe pain.    [provider]  potassium chloride SA (KLOR-CON M) 20 MEQ tablet TAKE 1 TABLET BY MOUTH DAILY ON  MONDAY, WEDNESDAY, FRIDAY,  SATURDAY, AND SUNDAY Patient taking  differently: daily. 06/11/22   Allwardt, Crist Infante, PA-C  pregabalin (LYRICA) 75 MG capsule TAKE 1 CAPSULE BY MOUTH TWICE  DAILY 06/06/22   Allwardt, Alyssa M, PA-C  RYBELSUS 3 MG TABS Take 1 tablet by mouth daily. 10/15/21   [provider]      Allergies    Losartan, Morphine and codeine, and Sulfa antibiotics    Review of Systems   Review of Systems  Musculoskeletal:        Left buttock and left lower extremity pain    Physical Exam Updated Vital Signs BP (!) 116/48   Pulse 69   Temp 97.8 F (36.6 C) (Oral)   Resp (!) 24   Ht 5\' 4"  (1.626 m)   Wt 99.8 kg   SpO2 (!) 89%   BMI 37.76 kg/m  Physical Exam Vitals and nursing note reviewed.  Constitutional:      General: She is not in acute distress.    Appearance: She is well-developed.  HENT:     Head: Normocephalic and atraumatic.  Eyes:     Conjunctiva/sclera: Conjunctivae normal.  Cardiovascular:     Rate and Rhythm: Normal rate and regular rhythm.     Heart sounds: No murmur heard. Pulmonary:     Effort: Pulmonary effort is normal. No respiratory distress.     Breath sounds: Normal breath sounds.  Abdominal:     Palpations: Abdomen is soft.     Tenderness: There is no abdominal tenderness.  Musculoskeletal:        General: No swelling.     Cervical back: Neck supple.     Comments: Left BKA.  Able to move all extremities appropriately, turns in the bed without problem No obvious deformity, edema, erythema of the left buttock and left lower extremity No tenderness palpation of the left buttock or left lower extremity  Small contusion of the left hip  Skin:    General: Skin is warm and dry.     Capillary Refill: Capillary refill takes less than 2 seconds.  Neurological:     Mental Status: She is alert.  Psychiatric:        Mood and Affect: Mood normal.     ED Results / Procedures / Treatments   Labs (all labs ordered are listed, but only abnormal results are displayed) Labs Reviewed  CBC WITH  DIFFERENTIAL/PLATELET - Abnormal; Notable for the following components:      Result Value   WBC 14.1 (*)    Neutro Abs 10.4 (*)    Abs Immature Granulocytes 0.10 (*)    All other components within normal limits  COMPREHENSIVE METABOLIC PANEL - Abnormal; Notable for the following components:   Glucose, Bld 126 (*)    BUN 35 (*)    Creatinine, Ser 1.82 (*)    AST 13 (*)    GFR, Estimated 30 (*)    All other components within normal  limits    EKG None  Radiology No results found.  Procedures Procedures    Medications Ordered in ED Medications  oxyCODONE-acetaminophen (PERCOCET/ROXICET) 5-325 MG per tablet 1 tablet (1 tablet Oral Given 11/05/22 1629)    ED Course/ Medical Decision Making/ A&P Clinical Course as of 11/05/22 1703  Wed Nov 05, 2022  1543 Creatinine(!): 1.82 At baseline [RP]    Clinical Course User Index [RP] Rondel Baton, MD                                 Medical Decision Making Amount and/or Complexity of Data Reviewed Labs: ordered. Decision-making details documented in ED Course.  Risk Prescription drug management.   67 y.o. female with pertinent past medical history of type 2 diabetes, BKA left lower extremity, chronic left sided buttock/LLE pain with pain management involvement, presents to the ED for concern of left buttock and left lower extremity pain  Differential diagnosis includes but is not limited to pressure ulcer, sciatica, chronic back pain, phantom limb pain  ED Course:  Patient states she has this chronic left-sided buttock and left leg pain at baseline, but has worsened over the past 2 or 3 days.  No injuries to the area.  She states her oxycodone from pain management is not adequately managing her pain.  Feels like she may need cortisone shots.  No red flag signs of fever, chills, loss of bowel or bladder control, history of malignancy, IV drug use.  On exam the left lower extremity and buttock do not have any signs of  cellulitis or injury.  Will obtain CBC and CMP to ensure no signs of infection, however do not suspect this is likely given no edema or erythema of her limb, no history of fever or chills.   CBC does have slight leukocytosis, but this is consistent with patient baseline labs.  No concern on CMP. Dr. Eloise Magloire also evaluated the patient and believe this is a flare of her chronic baseline pain.  No further imaging or workup indicated at this time.  Will discharge on Medrol Dosepak and instructions to contact the neurosurgery and spine team for an appointment.  Instructed follow-up with her pain management team. She was given Percocet here for her pain with some improvement  Impression: Left buttock pain  Disposition:  The patient was discharged home with instructions to take Medrol Dosepak as instructed and schedule appointment with neurosurgery and spine team.  Follow-up with her pain management team Return precautions given.  Lab Tests: I Ordered, and personally interpreted labs.  The pertinent results include:   CBC with leukocytosis of 14.1 CMP within patient baseline  Imaging Studies ordered: Not indicated   Co morbidities that complicate the patient evaluation   type 2 diabetes, BKA left lower extremity, chronic left buttock/LLE pain with pain management involvement,            Final Clinical Impression(s) / ED Diagnoses Final diagnoses:  Left buttock pain    Rx / DC Orders ED Discharge Orders          Ordered    methylPREDNISolone (MEDROL DOSEPAK) 4 MG TBPK tablet        11/05/22 1648              Arabella Merles, PA-C 11/05/22 1703    Arabella Merles, PA-C 11/05/22 1704    Rondel Baton, MD 12/02/22 [phone]

## 2022-11-08 ENCOUNTER — Emergency Department (HOSPITAL_COMMUNITY)
Admission: EM | Admit: 2022-11-08 | Discharge: 2022-11-08 | Disposition: A | Discharge status: Home / Self Care | Payer: 59 | Attending: Emergency Medicine | Admitting: Emergency Medicine

## 2022-11-08 ENCOUNTER — Other Ambulatory Visit: Payer: Self-pay

## 2022-11-08 ENCOUNTER — Encounter (HOSPITAL_COMMUNITY): Payer: Self-pay | Admitting: Emergency Medicine

## 2022-11-08 DIAGNOSIS — M549 Dorsalgia, unspecified: Secondary | ICD-10-CM | POA: Insufficient documentation

## 2022-11-08 DIAGNOSIS — G8929 Other chronic pain: Secondary | ICD-10-CM | POA: Diagnosis not present

## 2022-11-08 MED ORDER — HYDROMORPHONE HCL 1 MG/ML IJ SOLN
0.5000 mg | Freq: Once | INTRAMUSCULAR | Status: AC
  Administered 2022-11-08: 0.5 mg via SUBCUTANEOUS
  Filled 2022-11-08: qty 1

## 2022-11-08 MED ORDER — DIAZEPAM 5 MG PO TABS
10.0000 mg | ORAL_TABLET | Freq: Once | ORAL | Status: AC
  Administered 2022-11-08: 10 mg via ORAL
  Filled 2022-11-08: qty 2

## 2022-11-08 NOTE — ED Triage Notes (Signed)
Pt BIB EMS from home, c/o progressively worse sciatica pain x 1 week. Per EMS, pt has been out of pain medication, unable to resolve pain. Started on steroids for unknown amount of time.   BP 150/80 P 80 spO2 96% CBG 215

## 2022-11-08 NOTE — ED Provider Notes (Signed)
[place] EMERGENCY DEPARTMENT AT [facility] Provider Note   CSN: [id number] Arrival date & time: 11/08/22  1257     History  Chief Complaint  Patient presents with   Back Pain    Sciatica    Shirley Chen is a 67 y.o. female.  Six 23-year-old female with history of chronic back pain secondary sciatica presents with worsening symptoms.  Patient seen for similar symptoms recently.  Patient states her home medications are not helping.  Denies any urinary symptoms.  Denies any history of trauma.  Patient has appointment with pain management scheduled in 48 hours.  Currently on steroids at this time       Home Medications Prior to Admission medications   Medication Sig Start Date End Date Taking? Authorizing Provider  ACCU-CHEK GUIDE test strip USE 1 STRIP TO CHECK BLOOD  GLUCOSE 3 TIMES DAILY 10/29/22   Allwardt, Alyssa M, PA-C  amLODipine (NORVASC) 10 MG tablet TAKE 1 TABLET BY MOUTH DAILY 06/16/22   Allwardt, Alyssa M, PA-C  amoxicillin-clavulanate (AUGMENTIN) 875-125 MG tablet Take 1 tablet by mouth every 12 (twelve) hours. 08/19/22   Netta Corrigan, PA-C  atorvastatin (LIPITOR) 40 MG tablet TAKE 1 TABLET BY MOUTH DAILY 06/16/22   Allwardt, Crist Infante, PA-C  blood glucose meter kit and supplies KIT Dispense based on patient and insurance preference. Use up to four times daily as directed. (FOR ICD-9 250.00, 250.01). 05/27/21   Allwardt, Alyssa M, PA-C  Calcium Carbonate Antacid (CALCIUM CARBONATE, DOSED IN MG ELEMENTAL CALCIUM,) 1250 MG/5ML SUSP Take 5 mLs (500 mg of elemental calcium total) by mouth every 6 (six) hours as needed for indigestion. 10/04/20   Arnetha Courser, MD  carvedilol (COREG) 25 MG tablet TAKE 1 TABLET BY MOUTH TWICE  DAILY 06/16/22   Allwardt, Crist Infante, PA-C  Cholecalciferol 25 MCG (1000 UT) tablet Take 1,000 Units by mouth daily.    [provider]  Cyanocobalamin (VITAMIN B 12 PO) Take by mouth.    [provider]  diclofenac Sodium  (VOLTAREN ARTHRITIS PAIN) 1 % GEL Apply 2 g topically 4 (four) times daily. 02/10/22   Allwardt, Crist Infante, PA-C  ezetimibe (ZETIA) 10 MG tablet TAKE 1 TABLET BY MOUTH DAILY 06/16/22   Allwardt, Alyssa M, PA-C  ferrous sulfate 325 (65 FE) MG EC tablet Take 325 mg by mouth 3 (three) times daily with meals.    [provider]  furosemide (LASIX) 40 MG tablet TAKE 1 TABLET BY MOUTH DAILY ON  MONDAY, WEDNESDAY,FRIDAY,  SATURDAY AND SUNDAY.(ALTERNATE  WITH 20MG  TABLET) 07/30/22   Allwardt, Alyssa M, PA-C  glucose monitoring kit (FREESTYLE) monitoring kit 1 each by Does not apply route 4 (four) times daily - after meals and at bedtime. 1 month Diabetic Testing Supplies for QAC-QHS accuchecks. Any brand OK. Diagnosis E11.65 02/07/19   Olive Bass, FNP  HUMALOG MIX 75/25 KWIKPEN (75-25) 100 UNIT/ML KwikPen INJECT 30 UNITS INTO THE SKIN  TWICE DAILY 08/04/22   Allwardt, Crist Infante, PA-C  hydrALAZINE (APRESOLINE) 50 MG tablet TAKE 1 TABLET BY MOUTH 3 TIMES  DAILY 10/29/22   Allwardt, Crist Infante, PA-C  linaclotide (LINZESS) 145 MCG CAPS capsule Take 1 capsule (145 mcg total) by mouth daily before breakfast. 05/13/22   Allwardt, Crist Infante, PA-C  methylPREDNISolone (MEDROL DOSEPAK) 4 MG TBPK tablet Take as directed on labeling 11/05/22   Rondel Baton, MD  oxyCODONE-acetaminophen (PERCOCET/ROXICET) 5-325 MG tablet Take 1 tablet by mouth every 8 (eight) hours as needed  for severe pain.    [provider]  potassium chloride SA (KLOR-CON M) 20 MEQ tablet TAKE 1 TABLET BY MOUTH DAILY ON  MONDAY, WEDNESDAY, FRIDAY,  SATURDAY, AND SUNDAY Patient taking differently: daily. 06/11/22   Allwardt, Crist Infante, PA-C  pregabalin (LYRICA) 75 MG capsule TAKE 1 CAPSULE BY MOUTH TWICE  DAILY 06/06/22   Allwardt, Alyssa M, PA-C  RYBELSUS 3 MG TABS Take 1 tablet by mouth daily. 10/15/21   [provider]      Allergies    Losartan, Morphine and codeine, and Sulfa antibiotics    Review of Systems   Review of  Systems  All other systems reviewed and are negative.   Physical Exam Updated Vital Signs BP (!) 107/94   Pulse 85   Temp 98.3 F (36.8 C)   Resp 20   SpO2 99%  Physical Exam Vitals and nursing note reviewed.  Constitutional:      General: She is not in acute distress.    Appearance: Normal appearance. She is well-developed. She is not toxic-appearing.  HENT:     Head: Normocephalic and atraumatic.  Eyes:     General: Lids are normal.     Conjunctiva/sclera: Conjunctivae normal.     Pupils: Pupils are equal, round, and reactive to light.  Neck:     Thyroid: No thyroid mass.     Trachea: No tracheal deviation.  Cardiovascular:     Rate and Rhythm: Normal rate and regular rhythm.     Heart sounds: Normal heart sounds. No murmur heard.    No gallop.  Pulmonary:     Effort: Pulmonary effort is normal. No respiratory distress.     Breath sounds: Normal breath sounds. No stridor. No decreased breath sounds, wheezing, rhonchi or rales.  Abdominal:     General: There is no distension.     Palpations: Abdomen is soft.     Tenderness: There is no abdominal tenderness. There is no rebound.  Musculoskeletal:        General: No tenderness. Normal range of motion.     Cervical back: Normal range of motion and neck supple.       Back:  Skin:    General: Skin is warm and dry.     Findings: No abrasion or rash.  Neurological:     Mental Status: She is alert and oriented to person, place, and time. Mental status is at baseline.     GCS: GCS eye subscore is 4. GCS verbal subscore is 5. GCS motor subscore is 6.     Cranial Nerves: Cranial nerves are intact. No cranial nerve deficit.     Sensory: No sensory deficit.     Motor: Motor function is intact.  Psychiatric:        Attention and Perception: Attention normal.        Speech: Speech normal.        Behavior: Behavior normal.     ED Results / Procedures / Treatments   Labs (all labs ordered are listed, but only abnormal  results are displayed) Labs Reviewed - No data to display  EKG None  Radiology No results found.  Procedures Procedures    Medications Ordered in ED Medications  diazepam (VALIUM) tablet 10 mg (has no administration in time range)  HYDROmorphone (DILAUDID) injection 0.5 mg (has no administration in time range)    ED Course/ Medical Decision Making/ A&P  Medical Decision Making Risk Prescription drug management.   Patient given medication here feels better.  No concern for severe spinal etiology.  Normal bowel bladder function.  Patient to follow-up with pain management next week        Final Clinical Impression(s) / ED Diagnoses Final diagnoses:  None    Rx / DC Orders ED Discharge Orders     None         Lorre Nick, MD 11/08/22 1419

## 2022-11-10 ENCOUNTER — Ambulatory Visit: Payer: 59 | Admitting: Physician Assistant

## 2022-11-11 ENCOUNTER — Encounter (HOSPITAL_COMMUNITY): Payer: Self-pay

## 2022-11-11 ENCOUNTER — Emergency Department (HOSPITAL_COMMUNITY)
Admission: EM | Admit: 2022-11-11 | Discharge: 2022-11-11 | Disposition: A | Discharge status: Home / Self Care | Payer: 59 | Attending: Emergency Medicine | Admitting: Emergency Medicine

## 2022-11-11 ENCOUNTER — Ambulatory Visit (INDEPENDENT_AMBULATORY_CARE_PROVIDER_SITE_OTHER): Payer: 59 | Admitting: Physician Assistant

## 2022-11-11 ENCOUNTER — Emergency Department (HOSPITAL_COMMUNITY): Payer: 59

## 2022-11-11 ENCOUNTER — Other Ambulatory Visit: Payer: Self-pay

## 2022-11-11 ENCOUNTER — Encounter: Payer: Self-pay | Admitting: Physician Assistant

## 2022-11-11 VITALS — BP 92/62 | HR 88 | Wt 220.0 lb

## 2022-11-11 DIAGNOSIS — Z79899 Other long term (current) drug therapy: Secondary | ICD-10-CM | POA: Insufficient documentation

## 2022-11-11 DIAGNOSIS — E1140 Type 2 diabetes mellitus with diabetic neuropathy, unspecified: Secondary | ICD-10-CM | POA: Diagnosis not present

## 2022-11-11 DIAGNOSIS — M25561 Pain in right knee: Secondary | ICD-10-CM | POA: Insufficient documentation

## 2022-11-11 DIAGNOSIS — M5432 Sciatica, left side: Secondary | ICD-10-CM

## 2022-11-11 DIAGNOSIS — M5441 Lumbago with sciatica, right side: Secondary | ICD-10-CM | POA: Insufficient documentation

## 2022-11-11 DIAGNOSIS — E1122 Type 2 diabetes mellitus with diabetic chronic kidney disease: Secondary | ICD-10-CM | POA: Diagnosis not present

## 2022-11-11 DIAGNOSIS — I10 Essential (primary) hypertension: Secondary | ICD-10-CM | POA: Insufficient documentation

## 2022-11-11 DIAGNOSIS — Z794 Long term (current) use of insulin: Secondary | ICD-10-CM | POA: Diagnosis not present

## 2022-11-11 DIAGNOSIS — N1832 Chronic kidney disease, stage 3b: Secondary | ICD-10-CM

## 2022-11-11 DIAGNOSIS — W010XXA Fall on same level from slipping, tripping and stumbling without subsequent striking against object, initial encounter: Secondary | ICD-10-CM | POA: Insufficient documentation

## 2022-11-11 DIAGNOSIS — M545 Low back pain, unspecified: Secondary | ICD-10-CM | POA: Diagnosis present

## 2022-11-11 DIAGNOSIS — W19XXXA Unspecified fall, initial encounter: Secondary | ICD-10-CM

## 2022-11-11 LAB — POCT GLYCOSYLATED HEMOGLOBIN (HGB A1C): Hemoglobin A1C: 8.7 % — AB (ref 4.0–5.6)

## 2022-11-11 MED ORDER — HYDROMORPHONE HCL 1 MG/ML IJ SOLN
1.0000 mg | Freq: Once | INTRAMUSCULAR | Status: AC
  Administered 2022-11-11: 1 mg via INTRAMUSCULAR
  Filled 2022-11-11: qty 1

## 2022-11-11 NOTE — Discharge Instructions (Addendum)
It was a pleasure taking care of you today. As discussed, your CT scan did not show any new broken bones. It showed some bulging disc which is likely what is causing your symptoms. Continue taking you pain medication as previously prescribed. Follow-up with PCP within 1 week for recheck. Return to the ER for new or worsening symptoms.
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
## 2022-11-11 NOTE — ED Triage Notes (Signed)
Patient BIB EMS for fall last night, right leg and sciatica pain. Patient has chronic sciatica pain.

## 2022-11-11 NOTE — Progress Notes (Signed)
Orthopedic Tech Progress Note Patient Details:  Shirley Chen [birth year] [id number]  Ortho Devices Type of Ortho Device: Knee Sleeve Ortho Device/Splint Location: right Ortho Device/Splint Interventions: Ordered, Application, Adjustment   Post Interventions Patient Tolerated: Well Instructions Provided: Care of device, Adjustment of device  Kizzie Fantasia 11/11/2022, 2:07 PM

## 2022-11-11 NOTE — ED Provider Notes (Signed)
[place] EMERGENCY DEPARTMENT AT [facility] Provider Note   CSN: [id number] Arrival date & time: 11/11/22  1020     History  Chief Complaint  Patient presents with   Back Pain   Fall    Shirley Chen is a 67 y.o. female with a past medical history significant for hyperlipidemia and hypertension who presents to the ED after a mechanical fall that occurred last night.  Patient states her walker fell out from in front of her causing her to fall on her low back.  Admits to worsening low back pain.  Has a history of chronic low back pain.  Sees pain management.  Filled her prescription yesterday.  Denies saddle anesthesia, bowel/bladder incontinence, lower extremity numbness/tingling, lower extremity weakness.  Has a left lower extremity amputation.  Also admits to right knee pain.  No fever or chills.  No head injury or LOC.  No other injuries.  History obtained from patient and past medical records. No interpreter used during encounter.        Home Medications Prior to Admission medications   Medication Sig Start Date End Date Taking? Authorizing Provider  ACCU-CHEK GUIDE test strip USE 1 STRIP TO CHECK BLOOD  GLUCOSE 3 TIMES DAILY 10/29/22   Allwardt, Alyssa M, PA-C  amLODipine (NORVASC) 10 MG tablet TAKE 1 TABLET BY MOUTH DAILY 06/16/22   Allwardt, Alyssa M, PA-C  amoxicillin-clavulanate (AUGMENTIN) 875-125 MG tablet Take 1 tablet by mouth every 12 (twelve) hours. 08/19/22   Netta Corrigan, PA-C  atorvastatin (LIPITOR) 40 MG tablet TAKE 1 TABLET BY MOUTH DAILY 06/16/22   Allwardt, Crist Infante, PA-C  blood glucose meter kit and supplies KIT Dispense based on patient and insurance preference. Use up to four times daily as directed. (FOR ICD-9 250.00, 250.01). 05/27/21   Allwardt, Alyssa M, PA-C  Calcium Carbonate Antacid (CALCIUM CARBONATE, DOSED IN MG ELEMENTAL CALCIUM,) 1250 MG/5ML SUSP Take 5 mLs (500 mg of elemental calcium total) by mouth every 6 (six) hours as needed for  indigestion. 10/04/20   Arnetha Courser, MD  carvedilol (COREG) 25 MG tablet TAKE 1 TABLET BY MOUTH TWICE  DAILY 06/16/22   Allwardt, Crist Infante, PA-C  Cholecalciferol 25 MCG (1000 UT) tablet Take 1,000 Units by mouth daily.    [provider]  Cyanocobalamin (VITAMIN B 12 PO) Take by mouth.    [provider]  diclofenac Sodium (VOLTAREN ARTHRITIS PAIN) 1 % GEL Apply 2 g topically 4 (four) times daily. 02/10/22   Allwardt, Crist Infante, PA-C  ezetimibe (ZETIA) 10 MG tablet TAKE 1 TABLET BY MOUTH DAILY 06/16/22   Allwardt, Alyssa M, PA-C  ferrous sulfate 325 (65 FE) MG EC tablet Take 325 mg by mouth 3 (three) times daily with meals.    [provider]  furosemide (LASIX) 40 MG tablet TAKE 1 TABLET BY MOUTH DAILY ON  MONDAY, WEDNESDAY,FRIDAY,  SATURDAY AND SUNDAY.(ALTERNATE  WITH 20MG  TABLET) 07/30/22   Allwardt, Alyssa M, PA-C  glucose monitoring kit (FREESTYLE) monitoring kit 1 each by Does not apply route 4 (four) times daily - after meals and at bedtime. 1 month Diabetic Testing Supplies for QAC-QHS accuchecks. Any brand OK. Diagnosis E11.65 02/07/19   Olive Bass, FNP  HUMALOG MIX 75/25 KWIKPEN (75-25) 100 UNIT/ML KwikPen INJECT 30 UNITS INTO THE SKIN  TWICE DAILY 08/04/22   Allwardt, Crist Infante, PA-C  hydrALAZINE (APRESOLINE) 50 MG tablet TAKE 1 TABLET BY MOUTH 3 TIMES  DAILY 10/29/22   Allwardt, Crist Infante, PA-C  linaclotide (LINZESS) 145 MCG CAPS capsule Take 1 capsule (145 mcg total) by mouth daily before breakfast. 05/13/22   Allwardt, Crist Infante, PA-C  methylPREDNISolone (MEDROL DOSEPAK) 4 MG TBPK tablet Take as directed on labeling 11/05/22   Rondel Baton, MD  oxyCODONE-acetaminophen (PERCOCET/ROXICET) 5-325 MG tablet Take 1 tablet by mouth every 8 (eight) hours as needed for severe pain.    [provider]  potassium chloride SA (KLOR-CON M) 20 MEQ tablet TAKE 1 TABLET BY MOUTH DAILY ON  MONDAY, WEDNESDAY, FRIDAY,  SATURDAY, AND SUNDAY Patient taking  differently: daily. 06/11/22   Allwardt, Crist Infante, PA-C  pregabalin (LYRICA) 75 MG capsule TAKE 1 CAPSULE BY MOUTH TWICE  DAILY 06/06/22   Allwardt, Alyssa M, PA-C  RYBELSUS 3 MG TABS Take 1 tablet by mouth daily. 10/15/21   [provider]      Allergies    Losartan, Morphine and codeine, and Sulfa antibiotics    Review of Systems   Review of Systems  Musculoskeletal:  Positive for back pain and gait problem.    Physical Exam Updated Vital Signs BP 122/62 (BP Location: Right Arm)   Pulse 70   Temp 98 F (36.7 C) (Oral)   Resp 17   Ht 5\' 4"  (1.626 m)   Wt 99.8 kg   SpO2 98%   BMI 37.76 kg/m  Physical Exam Vitals and nursing note reviewed.  Constitutional:      General: She is not in acute distress.    Appearance: She is not ill-appearing.  HENT:     Head: Normocephalic.  Eyes:     Pupils: Pupils are equal, round, and reactive to light.  Cardiovascular:     Rate and Rhythm: Normal rate and regular rhythm.     Pulses: Normal pulses.     Heart sounds: Normal heart sounds. No murmur heard.    No friction rub. No gallop.  Pulmonary:     Effort: Pulmonary effort is normal.     Breath sounds: Normal breath sounds.  Abdominal:     General: Abdomen is flat. There is no distension.     Palpations: Abdomen is soft.     Tenderness: There is no abdominal tenderness. There is no guarding or rebound.  Musculoskeletal:        General: Normal range of motion.     Cervical back: Neck supple.     Comments: No thoracic midline tenderness. Mild lumber midline tenderness. LLE amputation. TTP throughout right knee. Full extension and flexion of right knee. Pedal pulses palpable.   Skin:    General: Skin is warm and dry.  Neurological:     General: No focal deficit present.     Mental Status: She is alert.  Psychiatric:        Mood and Affect: Mood normal.        Behavior: Behavior normal.     ED Results / Procedures / Treatments   Labs (all labs ordered are listed, but  only abnormal results are displayed) Labs Reviewed - No data to display  EKG None  Radiology CT Lumbar Spine Wo Contrast  Result Date: 11/11/2022 CLINICAL DATA:  Lumbar radiculopathy. Trauma. Chronic right leg and sciatica pain. EXAM: CT LUMBAR SPINE WITHOUT CONTRAST TECHNIQUE: Multidetector CT imaging of the lumbar spine was performed without intravenous contrast administration. Multiplanar CT image reconstructions were also generated. RADIATION DOSE REDUCTION: This exam was performed according to the departmental dose-optimization program which includes automated exposure control, adjustment of the mA and/or kV  according to patient size and/or use of iterative reconstruction technique. COMPARISON:  CT abdomen and pelvis 03/26/2021, MRI lumbar spine 07/30/2020 FINDINGS: Segmentation: There are 5 non-rib-bearing lumbar-type vertebral bodies. Alignment: Normal sagittal alignment. Minimal levocurvature centered at L4-5. Vertebrae: Vertebral body heights are maintained. Mild-to-moderate right L4-5 disc space narrowing is similar to prior. No acute fracture is seen. Paraspinal and other soft tissues: Moderate atherosclerotic calcifications within the abdominal aorta. No hydronephrosis within either kidney. Mild left renal pelvis vascular calcification. Disc levels: T12-L1: No posterior disc bulge, central canal narrowing, or neuroforaminal stenosis. L1-2: Mild left intraforaminal disc extension. No nerve impingement is seen. No central canal or neuroforaminal stenosis. L2-3: Mild bilateral facet joint hypertrophy. Mild left-greater-than-right intraforaminal disc extension. Borderline mild left neuroforaminal stenosis, similar to prior. The right neural foramen is patent. No central canal stenosis. L3-4: Moderate bilateral facet joint hypertrophy. Mild circumferential disc bulge. Mild bilateral intraforaminal disc extension. No significant neuroforaminal stenosis. No central canal stenosis. L4-5: Mild-to-moderate  bilateral facet joint hypertrophy. Within the limitations of this modality, there again appears to be a broad-based posterior disc bulge with superimposed right lateral recess posterior disc protrusion. Severe right lateral recess stenosis again likely impinging on the descending right L5 nerve. Overall mild right-greater-than-left central canal stenosis is similar to prior. Bilateral intraforaminal disc and endplate spurring with moderate bilateral neuroforaminal stenosis, similar to prior. L5-S1: Mild-to-moderate bilateral facet joint hypertrophy. Within the limitations of this modality, there appears to be a stable to slightly increased size of a midline posterior disc protrusion that measures up to approximately 5 mm in AP dimension compared to 3-4 mm previously. This disc may minimally contact the bilateral descending S1 nerves within the lateral recesses (axial series 13, image 103). No central canal stenosis. No neuroforaminal stenosis. IMPRESSION: 1. No acute fracture is seen. 2. Within the limitations of this modality, there again appears to be a broad-based posterior disc bulge with superimposed right lateral recess posterior disc protrusion at L4-5. Severe right lateral recess stenosis again likely impinging on the descending right L5 nerve. 3. At L5-S1, and midline posterior disc protrusion is unchanged to slightly increased in size. This disc may minimally contact the bilateral descending S1 nerves within the lateral recesses. 4. Borderline mild left neuroforaminal stenosis at L2-3. Aortic Atherosclerosis (ICD10-I70.0). Electronically Signed   By: Neita Garnet M.D.   On: 11/11/2022 14:40   DG Knee Complete 4 Views Right  Result Date: 11/11/2022 CLINICAL DATA:  Larey Seat last night.  Now having right knee pain. EXAM: RIGHT KNEE - COMPLETE 4+ VIEW COMPARISON:  None Available. FINDINGS: Minimal chronic enthesopathic changes at the quadriceps tendon insertion. No joint effusion. Joint spaces are preserved.  No acute fracture dislocation. High-grade atherosclerotic calcifications. Surgical clips are seen throughout the medial thigh and medial and lateral calf. IMPRESSION: 1. No acute fracture or dislocation. 2. High-grade atherosclerotic calcifications. Electronically Signed   By: Neita Garnet M.D.   On: 11/11/2022 11:30    Procedures Procedures    Medications Ordered in ED Medications  HYDROmorphone (DILAUDID) injection 1 mg (1 mg Intramuscular Given 11/11/22 1242)    ED Course/ Medical Decision Making/ A&P                                 Medical Decision Making Amount and/or Complexity of Data Reviewed Radiology: ordered and independent interpretation performed. Decision-making details documented in ED Course.  Risk Prescription drug management.   This patient presents  to the ED for concern of fall, this involves an extensive number of treatment options, and is a complaint that carries with it a high risk of complications and morbidity.  The differential diagnosis includes bony fracture, muscular strain, cauda equina, etc  67 year old female with history of chronic low back pain presents to the ED after a mechanical fall that occurred last night.  Patient fell directly on her low back.  Admits to worsening low back pain and right knee pain.  No head injury or LOC.  Denies saddle anesthesia, bowel/bladder incontinence, lower extremity numbness/tingling, lower extremity weakness.  Upon arrival, vitals all within normal limits.  Patient in no acute distress.  Mild lumbar midline tenderness.  Left lower extremity amputation.  Right lower extremity neurovascularly intact with soft compartments.  Mild tenderness to anterior aspect of right knee.  Full flexion and extension.  Given trauma, CT lumbar spine ordered.  X-ray of knee ordered at triage which I personally reviewed and interpreted which is negative for any acute bony fractures.  Patient given morphine.  Low suspicion for cauda equina or  central cord compression.  No infectious symptoms to suggest infectious etiology.  CT lumbar spine personally reviewed which is negative for any new bony fractures.  Does demonstrate some disc protrusions likely causing patient's lumbar radiculopathy.  Patient has a neurosurgery appointment on 8/15.  Patient able to ambulate in the ED.  Low suspicion for cauda equina or central cord compression.  Patient stable for discharge. Strict ED precautions discussed with patient. Patient states understanding and agrees to plan. Patient discharged home in no acute distress and stable vitals  Lives at home Has PCP Hx chronic low back pain       Final Clinical Impression(s) / ED Diagnoses Final diagnoses:  Fall, initial encounter  Acute right-sided low back pain with right-sided sciatica    Rx / DC Orders ED Discharge Orders     None         Mannie Stabile, PA-C 11/11/22 1457    Rolan Bucco, MD 11/11/22 1547

## 2022-11-13 ENCOUNTER — Other Ambulatory Visit: Payer: 59

## 2022-11-15 ENCOUNTER — Encounter (HOSPITAL_COMMUNITY): Payer: Self-pay | Admitting: Internal Medicine

## 2022-11-15 ENCOUNTER — Other Ambulatory Visit: Payer: Self-pay

## 2022-11-15 ENCOUNTER — Inpatient Hospital Stay (HOSPITAL_COMMUNITY)
Admission: EM | Admit: 2022-11-15 | Discharge: 2022-11-20 | DRG: 683 | Disposition: A | Discharge status: Skilled Nursing Facility | Payer: 59 | Attending: Family Medicine | Admitting: Family Medicine

## 2022-11-15 DIAGNOSIS — M5416 Radiculopathy, lumbar region: Secondary | ICD-10-CM | POA: Diagnosis present

## 2022-11-15 DIAGNOSIS — N179 Acute kidney failure, unspecified: Secondary | ICD-10-CM | POA: Diagnosis not present

## 2022-11-15 DIAGNOSIS — I739 Peripheral vascular disease, unspecified: Secondary | ICD-10-CM | POA: Diagnosis present

## 2022-11-15 DIAGNOSIS — R8271 Bacteriuria: Secondary | ICD-10-CM | POA: Diagnosis present

## 2022-11-15 DIAGNOSIS — Z9181 History of falling: Secondary | ICD-10-CM

## 2022-11-15 DIAGNOSIS — E1122 Type 2 diabetes mellitus with diabetic chronic kidney disease: Secondary | ICD-10-CM | POA: Diagnosis present

## 2022-11-15 DIAGNOSIS — J432 Centrilobular emphysema: Secondary | ICD-10-CM | POA: Diagnosis present

## 2022-11-15 DIAGNOSIS — M5430 Sciatica, unspecified side: Secondary | ICD-10-CM | POA: Diagnosis present

## 2022-11-15 DIAGNOSIS — G8929 Other chronic pain: Secondary | ICD-10-CM | POA: Diagnosis present

## 2022-11-15 DIAGNOSIS — Z83438 Family history of other disorder of lipoprotein metabolism and other lipidemia: Secondary | ICD-10-CM

## 2022-11-15 DIAGNOSIS — Z794 Long term (current) use of insulin: Secondary | ICD-10-CM

## 2022-11-15 DIAGNOSIS — E861 Hypovolemia: Secondary | ICD-10-CM | POA: Diagnosis present

## 2022-11-15 DIAGNOSIS — I130 Hypertensive heart and chronic kidney disease with heart failure and stage 1 through stage 4 chronic kidney disease, or unspecified chronic kidney disease: Secondary | ICD-10-CM | POA: Diagnosis present

## 2022-11-15 DIAGNOSIS — E860 Dehydration: Secondary | ICD-10-CM | POA: Diagnosis present

## 2022-11-15 DIAGNOSIS — M545 Low back pain, unspecified: Secondary | ICD-10-CM | POA: Diagnosis not present

## 2022-11-15 DIAGNOSIS — Z833 Family history of diabetes mellitus: Secondary | ICD-10-CM

## 2022-11-15 DIAGNOSIS — M5126 Other intervertebral disc displacement, lumbar region: Secondary | ICD-10-CM | POA: Diagnosis present

## 2022-11-15 DIAGNOSIS — Z87891 Personal history of nicotine dependence: Secondary | ICD-10-CM

## 2022-11-15 DIAGNOSIS — K5900 Constipation, unspecified: Secondary | ICD-10-CM | POA: Diagnosis present

## 2022-11-15 DIAGNOSIS — E1151 Type 2 diabetes mellitus with diabetic peripheral angiopathy without gangrene: Secondary | ICD-10-CM | POA: Diagnosis present

## 2022-11-15 DIAGNOSIS — M1990 Unspecified osteoarthritis, unspecified site: Secondary | ICD-10-CM | POA: Diagnosis present

## 2022-11-15 DIAGNOSIS — Z87440 Personal history of urinary (tract) infections: Secondary | ICD-10-CM

## 2022-11-15 DIAGNOSIS — Z79899 Other long term (current) drug therapy: Secondary | ICD-10-CM

## 2022-11-15 DIAGNOSIS — E785 Hyperlipidemia, unspecified: Secondary | ICD-10-CM | POA: Diagnosis present

## 2022-11-15 DIAGNOSIS — B9620 Unspecified Escherichia coli [E. coli] as the cause of diseases classified elsewhere: Secondary | ICD-10-CM | POA: Diagnosis present

## 2022-11-15 DIAGNOSIS — N1832 Chronic kidney disease, stage 3b: Secondary | ICD-10-CM | POA: Diagnosis present

## 2022-11-15 DIAGNOSIS — I5032 Chronic diastolic (congestive) heart failure: Secondary | ICD-10-CM | POA: Diagnosis present

## 2022-11-15 DIAGNOSIS — Z89512 Acquired absence of left leg below knee: Secondary | ICD-10-CM

## 2022-11-15 DIAGNOSIS — M5127 Other intervertebral disc displacement, lumbosacral region: Secondary | ICD-10-CM | POA: Diagnosis present

## 2022-11-15 DIAGNOSIS — Z8249 Family history of ischemic heart disease and other diseases of the circulatory system: Secondary | ICD-10-CM

## 2022-11-15 DIAGNOSIS — I2510 Atherosclerotic heart disease of native coronary artery without angina pectoris: Secondary | ICD-10-CM | POA: Diagnosis present

## 2022-11-15 DIAGNOSIS — Z882 Allergy status to sulfonamides status: Secondary | ICD-10-CM

## 2022-11-15 DIAGNOSIS — I5189 Other ill-defined heart diseases: Secondary | ICD-10-CM

## 2022-11-15 DIAGNOSIS — E1159 Type 2 diabetes mellitus with other circulatory complications: Secondary | ICD-10-CM | POA: Diagnosis present

## 2022-11-15 DIAGNOSIS — E66812 Obesity, class 2: Secondary | ICD-10-CM | POA: Diagnosis present

## 2022-11-15 DIAGNOSIS — Z6837 Body mass index (BMI) 37.0-37.9, adult: Secondary | ICD-10-CM

## 2022-11-15 DIAGNOSIS — Z885 Allergy status to narcotic agent status: Secondary | ICD-10-CM

## 2022-11-15 DIAGNOSIS — N390 Urinary tract infection, site not specified: Secondary | ICD-10-CM | POA: Diagnosis present

## 2022-11-15 DIAGNOSIS — Z888 Allergy status to other drugs, medicaments and biological substances status: Secondary | ICD-10-CM

## 2022-11-15 DIAGNOSIS — N1831 Chronic kidney disease, stage 3a: Secondary | ICD-10-CM | POA: Diagnosis present

## 2022-11-15 DIAGNOSIS — I700 Atherosclerosis of aorta: Secondary | ICD-10-CM | POA: Diagnosis present

## 2022-11-15 DIAGNOSIS — Z720 Tobacco use: Secondary | ICD-10-CM | POA: Diagnosis present

## 2022-11-15 DIAGNOSIS — E669 Obesity, unspecified: Secondary | ICD-10-CM | POA: Diagnosis present

## 2022-11-15 DIAGNOSIS — E1140 Type 2 diabetes mellitus with diabetic neuropathy, unspecified: Secondary | ICD-10-CM | POA: Diagnosis present

## 2022-11-15 LAB — URINALYSIS, W/ REFLEX TO CULTURE (INFECTION SUSPECTED)
Bilirubin Urine: NEGATIVE
Glucose, UA: NEGATIVE mg/dL
Ketones, ur: NEGATIVE mg/dL
Nitrite: POSITIVE — AB
Protein, ur: NEGATIVE mg/dL
Specific Gravity, Urine: 1.014 (ref 1.005–1.030)
Squamous Epithelial / HPF: >50 /HPF (ref 0–5)
WBC, UA: >50 WBC/hpf (ref 0–5)
pH: 5 (ref 5.0–8.0)

## 2022-11-15 LAB — GLUCOSE, CAPILLARY
Glucose-Capillary: 132 mg/dL — ABNORMAL HIGH (ref 70–99)
Glucose-Capillary: 161 mg/dL — ABNORMAL HIGH (ref 70–99)

## 2022-11-15 LAB — CBC WITH DIFFERENTIAL/PLATELET
Abs Immature Granulocytes: 0.11 10*3/uL — ABNORMAL HIGH (ref 0.00–0.07)
Basophils Absolute: 0.1 10*3/uL (ref 0.0–0.1)
Basophils Relative: 0 %
Eosinophils Absolute: 0.3 10*3/uL (ref 0.0–0.5)
Eosinophils Relative: 2 %
HCT: 40.8 % (ref 36.0–46.0)
Hemoglobin: 12.8 g/dL (ref 12.0–15.0)
Immature Granulocytes: 1 %
Lymphocytes Relative: 10 %
Lymphs Abs: 1.4 10*3/uL (ref 0.7–4.0)
MCH: 29.8 pg (ref 26.0–34.0)
MCHC: 31.4 g/dL (ref 30.0–36.0)
MCV: 95.1 fL (ref 80.0–100.0)
Monocytes Absolute: 0.8 10*3/uL (ref 0.1–1.0)
Monocytes Relative: 5 %
Neutro Abs: 11.8 10*3/uL — ABNORMAL HIGH (ref 1.7–7.7)
Neutrophils Relative %: 82 %
Platelets: 280 10*3/uL (ref 150–400)
RBC: 4.29 MIL/uL (ref 3.87–5.11)
RDW: 13.7 % (ref 11.5–15.5)
WBC: 14.4 10*3/uL — ABNORMAL HIGH (ref 4.0–10.5)
nRBC: 0 % (ref 0.0–0.2)

## 2022-11-15 LAB — BASIC METABOLIC PANEL
Anion gap: 10 (ref 5–15)
BUN: 76 mg/dL — ABNORMAL HIGH (ref 8–23)
CO2: 23 mmol/L (ref 22–32)
Calcium: 9 mg/dL (ref 8.9–10.3)
Chloride: 109 mmol/L (ref 98–111)
Creatinine, Ser: 2.79 mg/dL — ABNORMAL HIGH (ref 0.44–1.00)
GFR, Estimated: 18 mL/min — ABNORMAL LOW (ref >60)
Glucose, Bld: 172 mg/dL — ABNORMAL HIGH (ref 70–99)
Potassium: 4.6 mmol/L (ref 3.5–5.1)
Sodium: 142 mmol/L (ref 135–145)

## 2022-11-15 MED ORDER — LIDOCAINE 5 % EX PTCH
1.0000 | MEDICATED_PATCH | Freq: Every day | CUTANEOUS | Status: DC
  Administered 2022-11-15: 1 via TRANSDERMAL
  Filled 2022-11-15 (×7): qty 1

## 2022-11-15 MED ORDER — HYDROMORPHONE HCL 1 MG/ML IJ SOLN
1.0000 mg | INTRAMUSCULAR | Status: DC | PRN
  Administered 2022-11-16 (×2): 1 mg via INTRAVENOUS
  Filled 2022-11-15 (×4): qty 1

## 2022-11-15 MED ORDER — ATORVASTATIN CALCIUM 20 MG PO TABS
40.0000 mg | ORAL_TABLET | Freq: Every day | ORAL | Status: DC
  Administered 2022-11-16 – 2022-11-20 (×5): 40 mg via ORAL
  Filled 2022-11-15 (×5): qty 2

## 2022-11-15 MED ORDER — HYDRALAZINE HCL 50 MG PO TABS
50.0000 mg | ORAL_TABLET | Freq: Three times a day (TID) | ORAL | Status: DC
  Administered 2022-11-16 – 2022-11-20 (×13): 50 mg via ORAL
  Filled 2022-11-15 (×13): qty 1

## 2022-11-15 MED ORDER — OXYCODONE HCL 5 MG PO TABS
5.0000 mg | ORAL_TABLET | ORAL | Status: DC | PRN
  Administered 2022-11-15 – 2022-11-18 (×13): 5 mg via ORAL
  Filled 2022-11-15 (×13): qty 1

## 2022-11-15 MED ORDER — INSULIN ASPART 100 UNIT/ML IJ SOLN
0.0000 [IU] | Freq: Three times a day (TID) | INTRAMUSCULAR | Status: DC
  Administered 2022-11-15 – 2022-11-16 (×2): 2 [IU] via SUBCUTANEOUS
  Administered 2022-11-16 – 2022-11-17 (×3): 3 [IU] via SUBCUTANEOUS
  Administered 2022-11-17: 5 [IU] via SUBCUTANEOUS
  Administered 2022-11-18: 2 [IU] via SUBCUTANEOUS
  Administered 2022-11-18: 5 [IU] via SUBCUTANEOUS
  Administered 2022-11-18: 3 [IU] via SUBCUTANEOUS
  Administered 2022-11-19: 2 [IU] via SUBCUTANEOUS
  Administered 2022-11-19: 5 [IU] via SUBCUTANEOUS
  Administered 2022-11-20: 3 [IU] via SUBCUTANEOUS

## 2022-11-15 MED ORDER — SODIUM CHLORIDE 0.9 % IV BOLUS
1000.0000 mL | Freq: Once | INTRAVENOUS | Status: AC
  Administered 2022-11-15: 1000 mL via INTRAVENOUS

## 2022-11-15 MED ORDER — ACETAMINOPHEN 650 MG RE SUPP: 650.0000 mg | Freq: Four times a day (QID) | RECTAL | Status: DC | PRN

## 2022-11-15 MED ORDER — NICOTINE 21 MG/24HR TD PT24
21.0000 mg | MEDICATED_PATCH | Freq: Every day | TRANSDERMAL | Status: DC | PRN
  Filled 2022-11-15: qty 1

## 2022-11-15 MED ORDER — PREGABALIN 75 MG PO CAPS
75.0000 mg | ORAL_CAPSULE | Freq: Two times a day (BID) | ORAL | Status: DC
  Administered 2022-11-15 – 2022-11-20 (×10): 75 mg via ORAL
  Filled 2022-11-15 (×10): qty 1

## 2022-11-15 MED ORDER — SENNOSIDES-DOCUSATE SODIUM 8.6-50 MG PO TABS
1.0000 | ORAL_TABLET | Freq: Two times a day (BID) | ORAL | Status: DC
  Administered 2022-11-15 – 2022-11-19 (×9): 1 via ORAL
  Filled 2022-11-15 (×10): qty 1

## 2022-11-15 MED ORDER — CARVEDILOL 25 MG PO TABS
25.0000 mg | ORAL_TABLET | Freq: Two times a day (BID) | ORAL | Status: DC
  Administered 2022-11-16 – 2022-11-20 (×9): 25 mg via ORAL
  Filled 2022-11-15 (×9): qty 1

## 2022-11-15 MED ORDER — LINACLOTIDE 145 MCG PO CAPS
145.0000 ug | ORAL_CAPSULE | Freq: Every day | ORAL | Status: DC
  Administered 2022-11-16 – 2022-11-19 (×4): 145 ug via ORAL
  Filled 2022-11-15 (×5): qty 1

## 2022-11-15 MED ORDER — AMLODIPINE BESYLATE 10 MG PO TABS
10.0000 mg | ORAL_TABLET | Freq: Every day | ORAL | Status: DC
  Administered 2022-11-16 – 2022-11-20 (×5): 10 mg via ORAL
  Filled 2022-11-15 (×5): qty 1

## 2022-11-15 MED ORDER — ENOXAPARIN SODIUM 30 MG/0.3ML IJ SOSY
30.0000 mg | PREFILLED_SYRINGE | INTRAMUSCULAR | Status: DC
  Administered 2022-11-15 – 2022-11-16 (×2): 30 mg via SUBCUTANEOUS
  Filled 2022-11-15 (×2): qty 0.3

## 2022-11-15 MED ORDER — ACETAMINOPHEN 325 MG PO TABS
650.0000 mg | ORAL_TABLET | Freq: Four times a day (QID) | ORAL | Status: DC | PRN
  Administered 2022-11-16 – 2022-11-19 (×2): 650 mg via ORAL
  Filled 2022-11-15 (×2): qty 2

## 2022-11-15 MED ORDER — POLYETHYLENE GLYCOL 3350 17 G PO PACK
17.0000 g | PACK | Freq: Every day | ORAL | Status: DC
  Administered 2022-11-16 – 2022-11-19 (×4): 17 g via ORAL
  Filled 2022-11-15 (×5): qty 1

## 2022-11-15 MED ORDER — OXYCODONE-ACETAMINOPHEN 5-325 MG PO TABS
1.0000 | ORAL_TABLET | Freq: Once | ORAL | Status: AC
  Administered 2022-11-15: 1 via ORAL
  Filled 2022-11-15: qty 1

## 2022-11-15 MED ORDER — INSULIN LISPRO PROT & LISPRO (75-25 MIX) 100 UNIT/ML ~~LOC~~ SUSP
33.0000 [IU] | Freq: Two times a day (BID) | SUBCUTANEOUS | Status: DC
  Filled 2022-11-15: qty 10

## 2022-11-15 MED ORDER — NALOXONE HCL 0.4 MG/ML IJ SOLN: 0.4000 mg | INTRAMUSCULAR | Status: DC | PRN

## 2022-11-15 MED ORDER — SODIUM CHLORIDE 0.45 % IV SOLN: INTRAVENOUS | Status: DC

## 2022-11-15 MED ORDER — INSULIN ASPART PROT & ASPART (70-30 MIX) 100 UNIT/ML ~~LOC~~ SUSP
33.0000 [IU] | Freq: Two times a day (BID) | SUBCUTANEOUS | Status: DC
  Filled 2022-11-15: qty 10

## 2022-11-15 MED ORDER — SEMAGLUTIDE 3 MG PO TABS: 1.0000 | ORAL_TABLET | Freq: Every day | ORAL | Status: DC

## 2022-11-15 MED ORDER — ONDANSETRON HCL 4 MG PO TABS: 4.0000 mg | ORAL_TABLET | Freq: Four times a day (QID) | ORAL | Status: DC | PRN

## 2022-11-15 MED ORDER — ONDANSETRON HCL 4 MG/2ML IJ SOLN: 4.0000 mg | Freq: Four times a day (QID) | INTRAMUSCULAR | Status: DC | PRN

## 2022-11-15 NOTE — ED Provider Notes (Signed)
[place] EMERGENCY DEPARTMENT AT [facility] Provider Note   CSN: [id number] Arrival date & time: 11/15/22  1230     History  Chief Complaint  Patient presents with   Back Pain    Shirley Chen is a 67 y.o. female with PMHx HLD, HTN, chronic back pain who presents to ED concerned for back pain and abnormal kidney function tests. Patient was seen in ED 11/11/2022 after a fall and diagnosed with L4-5 disc protrusion with severe right stenosis. Patient with appointment with Neurosurgery on 11/20/2022. Patient's son-in-law at bedside stating that he is most concerned with patient's recent kidney function tests. Son-in-law stating that patient was supposed to have urine tested at PCP today which did not happen d/t patient's pain.  Patient denies urinary retention, fecal incontinence, saddle anesthesia, lower extremity weakness, hx of cancer, fever, immunosuppression, IVDU, spinal procedure, recent trauma. Denies chest pain, dyspnea, abdominal pain, nausea, vomiting, diarrhea, dysuria, hematuria.   Back Pain      Home Medications Prior to Admission medications   Medication Sig Start Date End Date Taking? Authorizing Provider  ACCU-CHEK GUIDE test strip USE 1 STRIP TO CHECK BLOOD  GLUCOSE 3 TIMES DAILY 10/29/22   Allwardt, Alyssa M, PA-C  amLODipine (NORVASC) 10 MG tablet TAKE 1 TABLET BY MOUTH DAILY 06/16/22   Allwardt, Alyssa M, PA-C  amoxicillin-clavulanate (AUGMENTIN) 875-125 MG tablet Take 1 tablet by mouth every 12 (twelve) hours. 08/19/22   Netta Corrigan, PA-C  atorvastatin (LIPITOR) 40 MG tablet TAKE 1 TABLET BY MOUTH DAILY 06/16/22   Allwardt, Crist Infante, PA-C  blood glucose meter kit and supplies KIT Dispense based on patient and insurance preference. Use up to four times daily as directed. (FOR ICD-9 250.00, 250.01). 05/27/21   Allwardt, Alyssa M, PA-C  Calcium Carbonate Antacid (CALCIUM CARBONATE, DOSED IN MG ELEMENTAL CALCIUM,) 1250 MG/5ML SUSP Take 5 mLs (500 mg of  elemental calcium total) by mouth every 6 (six) hours as needed for indigestion. 10/04/20   Arnetha Courser, MD  carvedilol (COREG) 25 MG tablet TAKE 1 TABLET BY MOUTH TWICE  DAILY 06/16/22   Allwardt, Crist Infante, PA-C  Cholecalciferol 25 MCG (1000 UT) tablet Take 1,000 Units by mouth daily.    [provider]  Cyanocobalamin (VITAMIN B 12 PO) Take by mouth.    [provider]  diclofenac Sodium (VOLTAREN ARTHRITIS PAIN) 1 % GEL Apply 2 g topically 4 (four) times daily. 02/10/22   Allwardt, Crist Infante, PA-C  ezetimibe (ZETIA) 10 MG tablet TAKE 1 TABLET BY MOUTH DAILY 06/16/22   Allwardt, Alyssa M, PA-C  ferrous sulfate 325 (65 FE) MG EC tablet Take 325 mg by mouth 3 (three) times daily with meals.    [provider]  furosemide (LASIX) 40 MG tablet TAKE 1 TABLET BY MOUTH DAILY ON  MONDAY, WEDNESDAY,FRIDAY,  SATURDAY AND SUNDAY.(ALTERNATE  WITH 20MG  TABLET) 07/30/22   Allwardt, Alyssa M, PA-C  glucose monitoring kit (FREESTYLE) monitoring kit 1 each by Does not apply route 4 (four) times daily - after meals and at bedtime. 1 month Diabetic Testing Supplies for QAC-QHS accuchecks. Any brand OK. Diagnosis E11.65 02/07/19   Olive Bass, FNP  HUMALOG MIX 75/25 KWIKPEN (75-25) 100 UNIT/ML KwikPen INJECT 30 UNITS INTO THE SKIN  TWICE DAILY 08/04/22   Allwardt, Crist Infante, PA-C  hydrALAZINE (APRESOLINE) 50 MG tablet TAKE 1 TABLET BY MOUTH 3 TIMES  DAILY 10/29/22   Allwardt, Crist Infante, PA-C  linaclotide (LINZESS) 145 MCG CAPS capsule Take 1  capsule (145 mcg total) by mouth daily before breakfast. 05/13/22   Allwardt, Crist Infante, PA-C  methylPREDNISolone (MEDROL DOSEPAK) 4 MG TBPK tablet Take as directed on labeling 11/05/22   Rondel Baton, MD  oxyCODONE-acetaminophen (PERCOCET/ROXICET) 5-325 MG tablet Take 1 tablet by mouth every 8 (eight) hours as needed for severe pain.    [provider]  potassium chloride SA (KLOR-CON M) 20 MEQ tablet TAKE 1 TABLET BY MOUTH DAILY ON  MONDAY,  WEDNESDAY, FRIDAY,  SATURDAY, AND SUNDAY Patient taking differently: daily. 06/11/22   Allwardt, Crist Infante, PA-C  pregabalin (LYRICA) 75 MG capsule TAKE 1 CAPSULE BY MOUTH TWICE  DAILY 06/06/22   Allwardt, Alyssa M, PA-C  RYBELSUS 3 MG TABS Take 1 tablet by mouth daily. 10/15/21   [provider]      Allergies    Losartan, Morphine and codeine, and Sulfa antibiotics    Review of Systems   Review of Systems  Musculoskeletal:  Positive for back pain.    Physical Exam Updated Vital Signs BP (!) 144/64 (BP Location: Right Arm)   Pulse 78   Temp 98.2 F (36.8 C) (Oral)   Resp 18   Ht 5\' 4"  (1.626 m)   Wt 99.8 kg   SpO2 94%   BMI 37.76 kg/m  Physical Exam Vitals and nursing note reviewed.  Constitutional:      General: She is not in acute distress.    Appearance: She is not toxic-appearing.  HENT:     Head: Normocephalic and atraumatic.     Mouth/Throat:     Mouth: Mucous membranes are moist.  Eyes:     General: No scleral icterus.       Right eye: No discharge.        Left eye: No discharge.     Conjunctiva/sclera: Conjunctivae normal.  Cardiovascular:     Rate and Rhythm: Normal rate and regular rhythm.     Pulses: Normal pulses.     Heart sounds: Normal heart sounds. No murmur heard. Pulmonary:     Effort: Pulmonary effort is normal.  Abdominal:     General: Abdomen is flat. Bowel sounds are normal.     Palpations: Abdomen is soft.  Musculoskeletal:     Comments: LLE BKA. Tenderness to palpation of lumbar paraspinal muscles. No bruising, lesions, swelling, or other skin findings.   Skin:    General: Skin is warm and dry.     Findings: No rash.  Neurological:     General: No focal deficit present.     Mental Status: She is alert. Mental status is at baseline.  Psychiatric:        Mood and Affect: Mood normal.        Behavior: Behavior normal.     ED Results / Procedures / Treatments   Labs (all labs ordered are listed, but only abnormal results are  displayed) Labs Reviewed  CBC WITH DIFFERENTIAL/PLATELET - Abnormal; Notable for the following components:      Result Value   WBC 14.4 (*)    Neutro Abs 11.8 (*)    Abs Immature Granulocytes 0.11 (*)    All other components within normal limits  BASIC METABOLIC PANEL - Abnormal; Notable for the following components:   Glucose, Bld 172 (*)    BUN 76 (*)    Creatinine, Ser 2.79 (*)    GFR, Estimated 18 (*)    All other components within normal limits  URINALYSIS, W/ REFLEX TO CULTURE (INFECTION SUSPECTED)  EKG None  Radiology No results found.  Procedures .Critical Care  Performed by: Dorthy Cooler, PA-C Authorized by: Dorthy Cooler, PA-C   Critical care provider statement:    Critical care time (minutes):  30   Critical care was necessary to treat or prevent imminent or life-threatening deterioration of the following conditions:  Dehydration and renal failure   Critical care was time spent personally by me on the following activities:  Development of treatment plan with patient or surrogate, discussions with consultants, evaluation of patient's response to treatment, examination of patient, ordering and review of laboratory studies, ordering and performing treatments and interventions, pulse oximetry, re-evaluation of patient's condition and review of old charts   Care discussed with: admitting provider       Medications Ordered in ED Medications  oxyCODONE-acetaminophen (PERCOCET/ROXICET) 5-325 MG per tablet 1 tablet (has no administration in time range)  sodium chloride 0.9 % bolus 1,000 mL (1,000 mLs Intravenous New Bag/Given 11/15/22 1414)    ED Course/ Medical Decision Making/ A&P                                 Medical Decision Making Amount and/or Complexity of Data Reviewed Labs: ordered.   This patient presents to the ED for concern of back pain, this involves an extensive number of treatment options, and is a complaint that carries with it a  high risk of complications and morbidity.  The differential diagnosis includes pyelonephritis, nephrolithiasis, spinal abscess, osteomyelitis, herniated disc, muscle strain, spinal fracture, meningitis, cancer, cauda equina syndrome.   Co morbidities that complicate the patient evaluation  HLD, HTN, chronic back pain   Lab Tests:  I Ordered, and personally interpreted labs.  The pertinent results include:   -CBC: leukocytosis (14.4); no anemia -BMP: elevated BUN/Cr (76/2.79) -UA: pending    Problem List / ED Course / Critical interventions / Medication management  Admitting patient for AKI Patient presents to ED concerned for back pain and abnormal kidney function test. Patient was supposed to follow up with PCP about elevated kidney function tests today - but was referred to ED instead d/t back pain. Patient had a fall 4 days ago and received CT at ED showing  L4-5 disc protrusion with severe right stenosis. Patient with outpatient neurosurgery appointment 11/20/22. Patient stating that she has not been drinking water because it is too painful to get up and use bathroom. Physical exam with tenderness of lumbar paraspinal muscles. Patient afebrile with stable vitals.  BMP concerning for elevated BUN/Cr at 76/2.79. CBC with leukocytosis 14.4. No anemia. UA pending.  Provided patient with pain management and IV fluids. I have reviewed the patients home medicines and have made adjustments as needed Dr. Robb Matar admitting provider    Social Determinants of Health:  none           Final Clinical Impression(s) / ED Diagnoses Final diagnoses:  AKI (acute kidney injury) [facility])    Rx / DC Orders ED Discharge Orders     None         Dorthy Cooler, [place] 11/15/22 1428    Glendora Score, MD 11/15/22 1525

## 2022-11-15 NOTE — ED Triage Notes (Signed)
Pt arrived via POV. C/o chronic back and sciatica pain. Seen several times recently for the same.
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
## 2022-11-16 ENCOUNTER — Observation Stay (HOSPITAL_COMMUNITY): Payer: 59

## 2022-11-16 DIAGNOSIS — E669 Obesity, unspecified: Secondary | ICD-10-CM | POA: Diagnosis present

## 2022-11-16 DIAGNOSIS — Z8249 Family history of ischemic heart disease and other diseases of the circulatory system: Secondary | ICD-10-CM | POA: Diagnosis not present

## 2022-11-16 DIAGNOSIS — I5189 Other ill-defined heart diseases: Secondary | ICD-10-CM | POA: Diagnosis not present

## 2022-11-16 DIAGNOSIS — I2510 Atherosclerotic heart disease of native coronary artery without angina pectoris: Secondary | ICD-10-CM | POA: Diagnosis present

## 2022-11-16 DIAGNOSIS — Z794 Long term (current) use of insulin: Secondary | ICD-10-CM | POA: Diagnosis not present

## 2022-11-16 DIAGNOSIS — I130 Hypertensive heart and chronic kidney disease with heart failure and stage 1 through stage 4 chronic kidney disease, or unspecified chronic kidney disease: Secondary | ICD-10-CM | POA: Diagnosis present

## 2022-11-16 DIAGNOSIS — E1140 Type 2 diabetes mellitus with diabetic neuropathy, unspecified: Secondary | ICD-10-CM | POA: Diagnosis present

## 2022-11-16 DIAGNOSIS — E1151 Type 2 diabetes mellitus with diabetic peripheral angiopathy without gangrene: Secondary | ICD-10-CM | POA: Diagnosis present

## 2022-11-16 DIAGNOSIS — E861 Hypovolemia: Secondary | ICD-10-CM | POA: Diagnosis present

## 2022-11-16 DIAGNOSIS — M545 Low back pain, unspecified: Secondary | ICD-10-CM | POA: Diagnosis present

## 2022-11-16 DIAGNOSIS — E1122 Type 2 diabetes mellitus with diabetic chronic kidney disease: Secondary | ICD-10-CM | POA: Diagnosis present

## 2022-11-16 DIAGNOSIS — Z89512 Acquired absence of left leg below knee: Secondary | ICD-10-CM | POA: Diagnosis not present

## 2022-11-16 DIAGNOSIS — E785 Hyperlipidemia, unspecified: Secondary | ICD-10-CM | POA: Diagnosis present

## 2022-11-16 DIAGNOSIS — I5030 Unspecified diastolic (congestive) heart failure: Secondary | ICD-10-CM | POA: Diagnosis not present

## 2022-11-16 DIAGNOSIS — I5032 Chronic diastolic (congestive) heart failure: Secondary | ICD-10-CM | POA: Diagnosis present

## 2022-11-16 DIAGNOSIS — J432 Centrilobular emphysema: Secondary | ICD-10-CM | POA: Diagnosis present

## 2022-11-16 DIAGNOSIS — Z833 Family history of diabetes mellitus: Secondary | ICD-10-CM | POA: Diagnosis not present

## 2022-11-16 DIAGNOSIS — I700 Atherosclerosis of aorta: Secondary | ICD-10-CM | POA: Diagnosis present

## 2022-11-16 DIAGNOSIS — Z79899 Other long term (current) drug therapy: Secondary | ICD-10-CM | POA: Diagnosis not present

## 2022-11-16 DIAGNOSIS — N1832 Chronic kidney disease, stage 3b: Secondary | ICD-10-CM | POA: Diagnosis present

## 2022-11-16 DIAGNOSIS — N390 Urinary tract infection, site not specified: Secondary | ICD-10-CM | POA: Diagnosis present

## 2022-11-16 DIAGNOSIS — N179 Acute kidney failure, unspecified: Secondary | ICD-10-CM | POA: Diagnosis present

## 2022-11-16 DIAGNOSIS — Z6837 Body mass index (BMI) 37.0-37.9, adult: Secondary | ICD-10-CM | POA: Diagnosis not present

## 2022-11-16 DIAGNOSIS — R262 Difficulty in walking, not elsewhere classified: Secondary | ICD-10-CM | POA: Diagnosis not present

## 2022-11-16 DIAGNOSIS — B9620 Unspecified Escherichia coli [E. coli] as the cause of diseases classified elsewhere: Secondary | ICD-10-CM | POA: Diagnosis present

## 2022-11-16 DIAGNOSIS — M5430 Sciatica, unspecified side: Secondary | ICD-10-CM | POA: Diagnosis present

## 2022-11-16 DIAGNOSIS — G8929 Other chronic pain: Secondary | ICD-10-CM | POA: Diagnosis present

## 2022-11-16 DIAGNOSIS — M5126 Other intervertebral disc displacement, lumbar region: Secondary | ICD-10-CM | POA: Diagnosis present

## 2022-11-16 LAB — COMPREHENSIVE METABOLIC PANEL WITH GFR
ALT: 12 U/L (ref 0–44)
AST: 11 U/L — ABNORMAL LOW (ref 15–41)
Albumin: 3.1 g/dL — ABNORMAL LOW (ref 3.5–5.0)
Alkaline Phosphatase: 77 U/L (ref 38–126)
Anion gap: 8 (ref 5–15)
BUN: 57 mg/dL — ABNORMAL HIGH (ref 8–23)
CO2: 21 mmol/L — ABNORMAL LOW (ref 22–32)
Calcium: 8.6 mg/dL — ABNORMAL LOW (ref 8.9–10.3)
Chloride: 112 mmol/L — ABNORMAL HIGH (ref 98–111)
Creatinine, Ser: 1.91 mg/dL — ABNORMAL HIGH (ref 0.44–1.00)
GFR, Estimated: 29 mL/min — ABNORMAL LOW (ref >60)
Glucose, Bld: 102 mg/dL — ABNORMAL HIGH (ref 70–99)
Potassium: 3.9 mmol/L (ref 3.5–5.1)
Sodium: 141 mmol/L (ref 135–145)
Total Bilirubin: 0.7 mg/dL (ref 0.3–1.2)
Total Protein: 6.1 g/dL — ABNORMAL LOW (ref 6.5–8.1)

## 2022-11-16 LAB — ECHOCARDIOGRAM COMPLETE
AR max vel: 2.89 cm2
AV Area VTI: 3.56 cm2
AV Area mean vel: 2.75 cm2
AV Mean grad: 3 mmHg
AV Peak grad: 5.9 mmHg
Ao pk vel: 1.21 m/s
Area-P 1/2: 3.46 cm2
Height: 64 in
S' Lateral: 3.1 cm
Weight: 3520 [oz_av]

## 2022-11-16 LAB — CBC
HCT: 37.2 % (ref 36.0–46.0)
Hemoglobin: 11.6 g/dL — ABNORMAL LOW (ref 12.0–15.0)
MCH: 29.9 pg (ref 26.0–34.0)
MCHC: 31.2 g/dL (ref 30.0–36.0)
MCV: 95.9 fL (ref 80.0–100.0)
Platelets: 261 10*3/uL (ref 150–400)
RBC: 3.88 MIL/uL (ref 3.87–5.11)
RDW: 13.8 % (ref 11.5–15.5)
WBC: 10.3 10*3/uL (ref 4.0–10.5)
nRBC: 0 % (ref 0.0–0.2)

## 2022-11-16 LAB — GLUCOSE, CAPILLARY
Glucose-Capillary: 108 mg/dL — ABNORMAL HIGH (ref 70–99)
Glucose-Capillary: 153 mg/dL — ABNORMAL HIGH (ref 70–99)
Glucose-Capillary: 135 mg/dL — ABNORMAL HIGH (ref 70–99)
Glucose-Capillary: 209 mg/dL — ABNORMAL HIGH (ref 70–99)

## 2022-11-16 LAB — HIV ANTIBODY (ROUTINE TESTING W REFLEX): HIV Screen 4th Generation wRfx: NONREACTIVE

## 2022-11-16 MED ORDER — METHYLPREDNISOLONE 4 MG PO TBPK
8.0000 mg | ORAL_TABLET | Freq: Every evening | ORAL | Status: DC
  Administered 2022-11-16: 8 mg via ORAL

## 2022-11-16 MED ORDER — SODIUM CHLORIDE 0.9 % IV SOLN
2.0000 g | Freq: Every day | INTRAVENOUS | Status: DC
  Administered 2022-11-16 – 2022-11-19 (×4): 2 g via INTRAVENOUS
  Filled 2022-11-16 (×4): qty 20

## 2022-11-16 MED ORDER — METHYLPREDNISOLONE 4 MG PO TBPK: 4.0000 mg | ORAL_TABLET | ORAL | Status: DC

## 2022-11-16 MED ORDER — METHYLPREDNISOLONE 4 MG PO TBPK: 4.0000 mg | ORAL_TABLET | Freq: Three times a day (TID) | ORAL | Status: DC

## 2022-11-16 MED ORDER — METHYLPREDNISOLONE 4 MG PO TBPK: 4.0000 mg | ORAL_TABLET | Freq: Four times a day (QID) | ORAL | Status: DC

## 2022-11-16 MED ORDER — CYCLOBENZAPRINE HCL 5 MG PO TABS
5.0000 mg | ORAL_TABLET | Freq: Three times a day (TID) | ORAL | Status: DC | PRN
  Administered 2022-11-16 – 2022-11-20 (×10): 5 mg via ORAL
  Filled 2022-11-16 (×12): qty 1

## 2022-11-16 MED ORDER — METHYLPREDNISOLONE 4 MG PO TBPK: 8.0000 mg | ORAL_TABLET | Freq: Every evening | ORAL | Status: DC

## 2022-11-16 MED ORDER — SODIUM CHLORIDE 0.45 % IV SOLN: INTRAVENOUS | Status: AC

## 2022-11-16 MED ORDER — METHYLPREDNISOLONE 4 MG PO TBPK
8.0000 mg | ORAL_TABLET | Freq: Every morning | ORAL | Status: AC
  Filled 2022-11-16: qty 21

## 2022-11-16 NOTE — Evaluation (Signed)
Physical Therapy Evaluation Patient Details Name: Shirley Chen MRN: [id number] DOB: [birth year] Today's Date: 11/16/2022  History of Present Illness  67 yo female admitted with AKI, worsening back pain/sciatica after falling at home. Hx of L BKA, femoral-tibial bypass surgery, bacteremia, falls, chronic back pain/sciatica, cellulitis, obesity, L4-L5 disc protrusion, stenosis, OA, DM, HAs  Clinical Impression  On eval, pt required Min A for bed mobility. She briefly sat EOB-Min guard A for balance. Pt could not tolerate sitting long enough to don prosthesis 2* pain. Assisted pt back to bed at her request. Will plan to follow and progress activity as safely able. Patient will benefit from continued inpatient follow up therapy, <3 hours/day, if she is agreeable. Her goal is to be able to return home.         If plan is discharge home, recommend the following: Assist for transportation;Assistance with cooking/housework;Help with stairs or ramp for entrance;A lot of help with walking and/or transfers;A lot of help with bathing/dressing/bathroom   Can travel by private vehicle        Equipment Recommendations None recommended by PT  Recommendations for Other Services  OT consult    Functional Status Assessment Patient has had a recent decline in their functional status and demonstrates the ability to make significant improvements in function in a reasonable and predictable amount of time.     Precautions / Restrictions Precautions Precautions: Fall Precaution Comments: L BKA-has prosthesis Restrictions Weight Bearing Restrictions: No      Mobility  Bed Mobility Overal bed mobility: Needs Assistance Bed Mobility: Sidelying to Sit, Sit to Sidelying   Sidelying to sit: Contact guard assist, Used rails, HOB elevated     Sit to sidelying: Min assist, Used rails, HOB elevated General bed mobility comments: Increased time. Assist for LEs back onto bed. Task was effortful for pt.     Transfers                   General transfer comment: Pt unaber to attempt 2* pain. Pt could not sit long enough to don her prosthesis before having to return to sidelying    Ambulation/Gait                  Stairs            Wheelchair Mobility     Tilt Bed    Modified Rankin (Stroke Patients Only)       Balance Overall balance assessment: Needs assistance Sitting-balance support: Feet supported Sitting balance-Leahy Scale: Fair                                       Pertinent Vitals/Pain Pain Assessment Pain Assessment: Faces Faces Pain Scale: Hurts even more Pain Location: back, sciatica bil LEs Pain Descriptors / Indicators: Crying Pain Intervention(s): Limited activity within patient's tolerance, Monitored during session, Repositioned    Home Living Family/patient expects to be discharged to:: Unsure Living Arrangements: Other relatives Available Help at Discharge: Family;Available PRN/intermittently Type of Home: House Home Access: Stairs to enter Entrance Stairs-Rails: Doctor, general practice of Steps: 3   Home Layout: One level Home Equipment: Cane - Programmer, applications (2 wheels);Rollator (4 wheels);Wheelchair - manual;Grab bars - tub/shower      Prior Function Prior Level of Function : Needs assist             Mobility Comments: ambulatory with prosthesis and rollator ADLs Comments:  assist with transporation. mod in for bathing, dressing     Extremity/Trunk Assessment   Upper Extremity Assessment Upper Extremity Assessment: Defer to OT evaluation    Lower Extremity Assessment Lower Extremity Assessment: Generalized weakness (L BKA)    Cervical / Trunk Assessment Cervical / Trunk Assessment: Normal  Communication   Communication Communication: No apparent difficulties  Cognition Arousal: Alert Behavior During Therapy: WFL for tasks assessed/performed Overall Cognitive Status: Within  Functional Limits for tasks assessed                                 General Comments: tearful upset about current condition        General Comments      Exercises     Assessment/Plan    PT Assessment Patient needs continued PT services  PT Problem List Decreased strength;Decreased range of motion;Decreased activity tolerance;Decreased balance;Decreased mobility;Pain;Decreased knowledge of use of DME;Obesity       PT Treatment Interventions DME instruction;Gait training;Functional mobility training;Therapeutic activities;Therapeutic exercise;Balance training;Patient/family education    PT Goals (Current goals can be found in the Care Plan section)  Acute Rehab PT Goals Patient Stated Goal: less pain. return home PT Goal Formulation: With patient Time For Goal Achievement: 11/30/22 Potential to Achieve Goals: Good    Frequency Min 1X/week     Co-evaluation               AM-PAC PT "6 Clicks" Mobility  Outcome Measure Help needed turning from your back to your side while in a flat bed without using bedrails?: A Little Help needed moving from lying on your back to sitting on the side of a flat bed without using bedrails?: A Little Help needed moving to and from a bed to a chair (including a wheelchair)?: Total Help needed standing up from a chair using your arms (e.g., wheelchair or bedside chair)?: Total Help needed to walk in hospital room?: Total Help needed climbing 3-5 steps with a railing? : Total 6 Click Score: 10    End of Session   Activity Tolerance: Patient limited by pain Patient left: in bed;with call bell/phone within reach (unable to set bed alarm?)   PT Visit Diagnosis: Pain;Repeated falls (R29.6);History of falling (Z91.81);Difficulty in walking, not elsewhere classified (R26.2);Other abnormalities of gait and mobility (R26.89) Pain - part of body: Leg (back)    Time: 1536-1550 PT Time Calculation (min) (ACUTE ONLY): 14  min   Charges:   PT Evaluation $PT Eval Low Complexity: 1 Low   PT General Charges $$ ACUTE PT VISIT: 1 Visit            Faye Ramsay, PT Acute Rehabilitation  Office: [phone]

## 2022-11-16 NOTE — Evaluation (Signed)
Occupational Therapy Evaluation Patient Details Name: Shirley Chen MRN: [id number] DOB: Aug 29, 1955 Today's Date: 11/16/2022   History of Present Illness 67 yo female admitted with AKI, worsening back pain after falling at home. Imaging revealed: 1. No acute fracture is seen. 2. Within the limitations of this modality, there again appears to  be a broad-based posterior disc bulge with superimposed right  lateral recess posterior disc protrusion at L4-5. Severe right  lateral recess stenosis again likely impinging on the descending  right L5 nerve. 3. At L5-S1, and midline posterior disc protrusion is unchanged to  slightly increased in size. This disc may minimally contact the  bilateral descending S1 nerves within the lateral recesses. 4. Borderline mild left neuroforaminal stenosis at L2-3.   PMH: L BKA, femoral-tibial bypass surgery, bacteremia, falls, chronic back pain/sciatica, cellulitis, obesity, L4-L5 disc protrusion, stenosis, OA, DM,   Clinical Impression      Pt is currently limited by the below listed deficits (see OT problem list), which compromises her ADL performance and overall functional independence. She is presenting below her baseline level of functioning for self care management. She reported significant "searing" and "stabbing" back down to BLE pain, which increased with activity. Given her increased pain, she was unable to attempt out of bed activity today. OT anticipates her overall occupational performance and functional abilities will improve significantly, pending improvement in her pain. She will benefit from further OT services to facilitate improved ADL performance and to decrease the risk for restricted participation in meaningful activities. She declined the option for SNF rehab at discharge, however stated she is interested in home health therapy.     If plan is discharge home, recommend the following: Assist for transportation;Assistance with cooking/housework;A lot of  help with bathing/dressing/bathroom;Help with stairs or ramp for entrance    Functional Status Assessment  Patient has had a recent decline in their functional status and demonstrates the ability to make significant improvements in function in a reasonable and predictable amount of time.  Equipment Recommendations  None recommended by OT    Recommendations for Other Services       Precautions / Restrictions Precautions Precautions: Fall Precaution Comments: L BKA-has prosthesis Restrictions Weight Bearing Restrictions: No RLE Weight Bearing: Weight bearing as tolerated      Mobility Bed Mobility Overal bed mobility: Needs Assistance Bed Mobility: Rolling Rolling: Supervision, Used rails (rolling left & right)              Transfers      General transfer comment: unable to assess, as pt presented with increased back and BLE with bed mobility      Balance       Sitting balance - Comments: unable to assess       Standing balance comment: unable to assess         ADL either performed or assessed with clinical judgement   ADL Overall ADL's : Needs assistance/impaired Eating/Feeding: Independent;Bed level   Grooming: Set up;Bed level   Upper Body Bathing: Set up;Bed level Upper Body Bathing Details (indicate cue type and reason): simulated Lower Body Bathing: Moderate assistance;Bed level Lower Body Bathing Details (indicate cue type and reason): based on clinical judgement Upper Body Dressing : Minimal assistance;Bed level   Lower Body Dressing: Bed level;Moderate assistance Lower Body Dressing Details (indicate cue type and reason): based on clinical judgement                      Pertinent Vitals/Pain Pain Assessment  Pain Assessment: 0-10 Pain Score: 5  Pain Location: back and BLE with activity Pain Descriptors / Indicators: Crying, Guarding, Grimacing Pain Intervention(s): Limited activity within patient's tolerance, Monitored during  session, Repositioned     Extremity/Trunk Assessment Upper Extremity Assessment Upper Extremity Assessment: Overall WFL for tasks assessed   Lower Extremity Assessment Lower Extremity Assessment: RLE deficits/detail;LLE deficits/detail RLE Deficits / Details: AROM and strength WFL, based on informal observation LLE Deficits / Details: Old L BKA. Knee and hip AROM WFL     Communication Communication Communication: No apparent difficulties   Cognition Arousal: Alert Behavior During Therapy: WFL for tasks assessed/performed Overall Cognitive Status: Within Functional Limits for tasks assessed            General Comments: tearful upset about current condition, Oriented x4, able to follow commands                Home Living Family/patient expects to be discharged to:: Private residence Living Arrangements: Other relatives (65 yr old granddaughter and son-in-law) Available Help at Discharge: Family;Available PRN/intermittently Type of Home: House Home Access: Stairs to enter [employer] of Steps: 3  Home Layout: One level     Bathroom Shower/Tub: Tub/shower unit         Home Equipment: Architectural technologist (4 wheels)          Prior Functioning/Environment Prior Level of Function : Independent/Modified Independent             Mobility Comments: ambulatory with L LE prosthesis and rollator ADLs Comments:  (She was independent with driving, doing her laundry, cooking, and ADLs.)        OT Problem List: Decreased activity tolerance;Impaired balance (sitting and/or standing);Pain      OT Treatment/Interventions: Self-care/ADL training;Therapeutic exercise;DME and/or AE instruction;Therapeutic activities;Balance training;Patient/family education    OT Goals(Current goals can be found in the care plan section) Acute Rehab OT Goals Patient Stated Goal: decreased pain and to be able to perform normal activities OT Goal Formulation: With  patient Time For Goal Achievement: 11/30/22 Potential to Achieve Goals: Good ADL Goals Pt Will Perform Lower Body Dressing: with supervision;sitting/lateral leans Pt Will Transfer to Toilet: with supervision;ambulating;grab bars Pt Will Perform Toileting - Clothing Manipulation and hygiene: with supervision;sit to/from stand  OT Frequency: Min 1X/week       AM-PAC OT "6 Clicks" Daily Activity     Outcome Measure Help from another person eating meals?: None Help from another person taking care of personal grooming?: A Little Help from another person toileting, which includes using toliet, bedpan, or urinal?: A Lot Help from another person bathing (including washing, rinsing, drying)?: A Lot Help from another person to put on and taking off regular upper body clothing?: A Little Help from another person to put on and taking off regular lower body clothing?: A Lot 6 Click Score: 16   End of Session Equipment Utilized During Treatment: Other (comment) (none) Nurse Communication: Mobility status  Activity Tolerance: Patient limited by pain Patient left: in bed;with call bell/phone within reach;with nursing/sitter in room;with bed alarm set  OT Visit Diagnosis: Pain;History of falling (Z91.81)                Time: 1610-9604 OT Time Calculation (min): 24 min Charges:  OT General Charges $OT Visit: 1 Visit OT Evaluation $OT Eval Moderate Complexity: 1 Mod     L , OTR/L 11/16/2022, 4:53 PM

## 2022-11-16 NOTE — Progress Notes (Signed)
PROGRESS NOTE    Shirley Chen  YQM:[id number] DOB: 01-13-1956 DOA: 11/15/2022 PCP: Bary Leriche, PA-C   Brief Narrative: 67 year old with past medical history significant for osteoarthritis, right foot cellulitis, constipation, diabetes type 2, headache, hyperlipidemia, hypertension, tobacco abuse in remission, CAD, PAD, UTI history of gram-negative bacteremia, class II obesity presents after a fall 4 days prior to admission resulting in L4-L5 disc protrusion with sciatica sent from PCP due to dehydration and AKI.  Due to pain she has been lying down in bed not drinking enough fluids.  She is constipated.  Evaluation in the ED she was found to have a BUN of 2.7, CT lumbar spine done 4 days prior to admission showed degenerative changes at all levels with particular  broad-based posterior disc bulge with superimposed right lateral recess posteriorly and protrusion L4-L5.  L5-S1 midline posterior disc protrusion is unchanged.  Severe right lateral recess stenosis likely impinging descending right L5 nerve.   Assessment & Plan:   Principal Problem:   AKI (acute kidney injury) (HCC) Active Problems:   Tobacco abuse   PAD (peripheral artery disease) (HCC)   Hypertension associated with diabetes (HCC)   Hyperlipidemia   Stage 3a chronic kidney disease (CKD) (HCC)   Aortic atherosclerosis (HCC)   Centrilobular emphysema (HCC)   Acquired absence of left leg below knee (HCC)   Type 2 diabetes mellitus with diabetic neuropathy, with long-term current use of insulin (HCC)   Asymptomatic bacteriuria   Class II obesity   Grade II diastolic dysfunction   Chronic kidney disease, stage 3b (HCC)  1-AKI on CKD stage IIIb: In setting of poor oral intake, hypovolemia.  CR peak to 2.7. now trending down to 1.9 Cr baseline 1.6---1.8 Improving.  Continue with IV fluids.   L4-L5 disc protrusion, sciatica No weakness on exam.  Start Dexamethasone pack Flexeril PRN oxycodone. Lyrica PT  evaluation   Tobacco abuse: Counseling.  Nicotine patch.   Hyperlipidemia Continue with Lipitor.   Emphysema PRN nebulizer.   Diabetes type 2 with diabetic neuropathy, long-term use of insulin SSI Hold 70/30 If cbg increases will start levemir.   Hypertension:  Continue with carvedilol, hydralazine, Norvasc. .   Grade 2 diastolic dysfunction Monitor volume status.  ECHO ordered.   PAD: On lipitor.   History of Left below-knee amputation  Class II obesity Need life style modification.   UTI;  Follow urine culture. Growing gram negative rods. Start ceftriaxone.       Estimated body mass index is 37.76 kg/m as calculated from the following:   Height as of this encounter: 5\' 4"  (1.626 m).   Weight as of this encounter: 99.8 kg.   DVT prophylaxis: Lovenox Code Status: Full code Family Communication: Care discussed with patient Disposition Plan:  Status is: Observation The patient remains OBS appropriate and will d/c before 2 midnights.    Consultants:  none  Procedures:  echo  Antimicrobials:    Subjective: She is complaining of back pain, sciatic pain, report pain  radiates to lower extremities, denies numbness. No weakness.  She has an appointment schedule on Thursday with neurosurgery    Objective: Vitals:   11/15/22 1232 11/15/22 2006 11/16/22 0007 11/16/22 0518  BP: (!) 144/64 (!) 151/59 (!) 147/61 (!) 153/57  Pulse: 78 77 73 66  Resp: 18 18 18 18   Temp: 98.2 F (36.8 C) 98.4 F (36.9 C) 97.8 F (36.6 C) 97.8 F (36.6 C)  TempSrc: Oral Oral Oral Oral  SpO2: 94% 98% 93% 98%  Weight: 99.8 kg     Height: 5\' 4"  (1.626 m)       Intake/Output Summary (Last 24 hours) at 11/16/2022 0724 Last data filed at 11/16/2022 0612 Gross per 24 hour  Intake 1690.04 ml  Output 775 ml  Net 915.04 ml   Filed Weights   11/15/22 1232  Weight: 99.8 kg    Examination:  General exam: Appears calm and comfortable  Respiratory system: Clear to  auscultation. Respiratory effort normal. Cardiovascular system: S1 & S2 heard, RRR.  Gastrointestinal system: Abdomen is nondistended, soft and nontender. Central nervous system: Alert and oriented.  Extremities: left BKA  Data Reviewed: I have personally reviewed following labs and imaging studies  CBC: Recent Labs  Lab 11/15/22 1321 11/16/22 0424  WBC 14.4* 10.3  NEUTROABS 11.8*  --   HGB 12.8 11.6*  HCT 40.8 37.2  MCV 95.1 95.9  PLT 280 261   Basic Metabolic Panel: Recent Labs  Lab 11/15/22 1321 11/16/22 0424  NA 142 141  K 4.6 3.9  CL 109 112*  CO2 23 21*  GLUCOSE 172* 102*  BUN 76* 57*  CREATININE 2.79* 1.91*  CALCIUM 9.0 8.6*   GFR: Estimated Creatinine Clearance: 33.3 mL/min (A) (by C-G formula based on SCr of 1.91 mg/dL (H)). Liver Function Tests: Recent Labs  Lab 11/16/22 0424  AST 11*  ALT 12  ALKPHOS 77  BILITOT 0.7  PROT 6.1*  ALBUMIN 3.1*   No results for input(s): "LIPASE", "AMYLASE" in the last 168 hours. No results for input(s): "AMMONIA" in the last 168 hours. Coagulation Profile: No results for input(s): "INR", "PROTIME" in the last 168 hours. Cardiac Enzymes: No results for input(s): "CKTOTAL", "CKMB", "CKMBINDEX", "TROPONINI" in the last 168 hours. BNP (last 3 results) No results for input(s): "PROBNP" in the last 8760 hours. HbA1C: No results for input(s): "HGBA1C" in the last 72 hours. CBG: Recent Labs  Lab 11/15/22 1633 11/15/22 2137 11/16/22 0714  GLUCAP 132* 161* 108*   Lipid Profile: No results for input(s): "CHOL", "HDL", "LDLCALC", "TRIG", "CHOLHDL", "LDLDIRECT" in the last 72 hours. Thyroid Function Tests: No results for input(s): "TSH", "T4TOTAL", "FREET4", "T3FREE", "THYROIDAB" in the last 72 hours. Anemia Panel: No results for input(s): "VITAMINB12", "FOLATE", "FERRITIN", "TIBC", "IRON", "RETICCTPCT" in the last 72 hours. Sepsis Labs: No results for input(s): "PROCALCITON", "LATICACIDVEN" in the last 168  hours.  No results found for this or any previous visit (from the past 240 hour(s)).       Radiology Studies: No results found.      Scheduled Meds:  amLODipine  10 mg Oral Daily   atorvastatin  40 mg Oral Daily   carvedilol  25 mg Oral BID WC   enoxaparin (LOVENOX) injection  30 mg Subcutaneous Q24H   hydrALAZINE  50 mg Oral TID   insulin aspart  0-15 Units Subcutaneous TID WC   insulin aspart protamine- aspart  33 Units Subcutaneous BID WC   lidocaine  1 patch Transdermal Daily   linaclotide  145 mcg Oral QAC breakfast   polyethylene glycol  17 g Oral Daily   pregabalin  75 mg Oral BID   Semaglutide  1 tablet Oral Daily   senna-docusate  1 tablet Oral BID   Continuous Infusions:  sodium chloride 150 mL/hr at 11/16/22 0534     LOS: 0 days    Time spent: 35 minutes     A , MD [facility]   If 7PM-7AM, please contact night-coverage [email or web address]  11/16/2022, 7:24 AM

## 2022-11-17 ENCOUNTER — Inpatient Hospital Stay (HOSPITAL_COMMUNITY): Payer: 59

## 2022-11-17 DIAGNOSIS — N179 Acute kidney failure, unspecified: Secondary | ICD-10-CM | POA: Diagnosis not present

## 2022-11-17 LAB — BASIC METABOLIC PANEL WITH GFR
Anion gap: 7 (ref 5–15)
BUN: 37 mg/dL — ABNORMAL HIGH (ref 8–23)
CO2: 23 mmol/L (ref 22–32)
Calcium: 8.6 mg/dL — ABNORMAL LOW (ref 8.9–10.3)
Chloride: 109 mmol/L (ref 98–111)
Creatinine, Ser: 1.43 mg/dL — ABNORMAL HIGH (ref 0.44–1.00)
GFR, Estimated: 40 mL/min — ABNORMAL LOW
Glucose, Bld: 163 mg/dL — ABNORMAL HIGH (ref 70–99)
Potassium: 4.4 mmol/L (ref 3.5–5.1)
Sodium: 139 mmol/L (ref 135–145)

## 2022-11-17 LAB — CBC
HCT: 38 % (ref 36.0–46.0)
Hemoglobin: 12.2 g/dL (ref 12.0–15.0)
MCH: 30.4 pg (ref 26.0–34.0)
MCHC: 32.1 g/dL (ref 30.0–36.0)
MCV: 94.8 fL (ref 80.0–100.0)
Platelets: 261 10*3/uL (ref 150–400)
RBC: 4.01 MIL/uL (ref 3.87–5.11)
RDW: 13.7 % (ref 11.5–15.5)
WBC: 10 10*3/uL (ref 4.0–10.5)
nRBC: 0 % (ref 0.0–0.2)

## 2022-11-17 LAB — GLUCOSE, CAPILLARY
Glucose-Capillary: 178 mg/dL — ABNORMAL HIGH (ref 70–99)
Glucose-Capillary: 211 mg/dL — ABNORMAL HIGH (ref 70–99)
Glucose-Capillary: 193 mg/dL — ABNORMAL HIGH (ref 70–99)
Glucose-Capillary: 219 mg/dL — ABNORMAL HIGH (ref 70–99)

## 2022-11-17 MED ORDER — ENOXAPARIN SODIUM 40 MG/0.4ML IJ SOSY
40.0000 mg | PREFILLED_SYRINGE | INTRAMUSCULAR | Status: DC
  Administered 2022-11-17 – 2022-11-19 (×3): 40 mg via SUBCUTANEOUS
  Filled 2022-11-17 (×3): qty 0.4

## 2022-11-17 MED ORDER — INSULIN DETEMIR 100 UNIT/ML ~~LOC~~ SOLN
15.0000 [IU] | Freq: Every day | SUBCUTANEOUS | Status: DC
  Administered 2022-11-17 – 2022-11-18 (×2): 15 [IU] via SUBCUTANEOUS
  Filled 2022-11-17 (×2): qty 0.15

## 2022-11-17 NOTE — Progress Notes (Signed)
Pt had been refusing the methylprednisolone. Educated pt on the importance of the med.

## 2022-11-17 NOTE — Progress Notes (Signed)
Pt continues to refuse Methylprednisone.

## 2022-11-17 NOTE — TOC Initial Note (Signed)
Transition of Care [facility]) - Initial/Assessment Note    Patient Details  Name: Shirley Chen MRN: [id number] Date of Birth: 08-21-1955  Transition of [facility]) CM/SW Contact:    Amada Jupiter, LCSW Phone Number: 11/17/2022, 11:13 AM  Clinical Narrative:                  Met with pt today to review therapy recommendations for SNF rehab.  Pt reports that she has support from her son-in-law and 66 y.o. granddaughter in the home, however, she has been severely limited with mobility due to pain.  Pt is aware that SNF rehab has been recommended but feels like her pain needs to be managed for her to be able to complete therapy.  She notes that she is scheduled to see Dr. Marikay Alar (neurosurgeon) on 8/15 "but now I'm here and I have no idea how I can even get there."   Pt really not yet agreeable with plan for SNF since she feels that she cannot get full benefit due to pain levels.  Have alerted MD to pt's concerns.  Will follow along and begin prep for SNF, however, will need pt's agreement.   Expected Discharge Plan:  (TBD) Barriers to Discharge: Continued Medical Work up   Patient Goals and CMS Choice Patient states their goals for this hospitalization and ongoing recovery are:: her primary goal is to have her pain addressed          Expected Discharge Plan and Services In-house Referral: Clinical Social Work     Living arrangements for the past 2 months: Single Family Home                                      Prior Living Arrangements/Services Living arrangements for the past 2 months: Single Family Home Lives with:: Relatives Patient language and need for interpreter reviewed:: Yes Do you feel safe going back to the place where you live?: Yes      Need for Family Participation in Patient Care: Yes (Comment) Care giver support system in place?: Yes (comment)   Criminal Activity/Legal Involvement Pertinent to Current Situation/Hospitalization: No - Comment as  needed  Activities of Daily Living Home Assistive Devices/Equipment: Prosthesis ADL Screening (condition at time of admission) Patient's cognitive ability adequate to safely complete daily activities?: Yes Is the patient deaf or have difficulty hearing?: No Does the patient have difficulty seeing, even when wearing glasses/contacts?: No Does the patient have difficulty concentrating, remembering, or making decisions?: No Patient able to express need for assistance with ADLs?: Yes Does the patient have difficulty dressing or bathing?: No Independently performs ADLs?: Yes (appropriate for developmental age) Does the patient have difficulty walking or climbing stairs?: Yes Weakness of Legs: Right Weakness of Arms/Hands: None  Permission Sought/Granted Permission sought to share information with : Family Supports Permission granted to share information with : Yes, Verbal Permission Granted  Share Information with NAME: Harden Mo, son-in-law @ [phone]           Emotional Assessment Appearance:: Appears stated age Attitude/Demeanor/Rapport: Engaged Affect (typically observed): Frustrated Orientation: : Oriented to Self, Oriented to Place, Oriented to  Time, Oriented to Situation Alcohol / Substance Use: Not Applicable Psych Involvement: No (comment)  Admission diagnosis:  AKI (acute kidney injury) (HCC) [N17.9] Patient Active Problem List   Diagnosis Date Noted   AKI (acute kidney injury) (HCC) 11/15/2022   Asymptomatic bacteriuria 11/15/2022  Class II obesity 11/15/2022   Grade II diastolic dysfunction 11/15/2022   Chronic kidney disease, stage 3b (HCC) 11/15/2022   Acquired absence of left leg below knee (HCC) 08/11/2022   Type 2 diabetes mellitus with diabetic neuropathy, with long-term current use of insulin (HCC) 08/11/2022   Elevated alkaline phosphatase level 05/13/2022   Constipation due to opioid therapy 05/13/2022   Aortic atherosclerosis (HCC) 11/08/2021    Centrilobular emphysema (HCC) 11/08/2021   Coronary artery calcification seen on CT scan 11/08/2021   Postmenopausal 11/08/2021   Calculus of gallbladder without cholecystitis without obstruction 11/08/2021   Absolute anemia 08/17/2021   Family history of thyroid disease 08/17/2021   Epidermoid cyst of neck 08/17/2021   Other constipation 08/17/2021   Sepsis (HCC)- e coli pyelonephritis 03/27/2021   Bilateral hydronephrosis 03/26/2021   Hyperlipidemia    Stage 3a chronic kidney disease (CKD) (HCC)    Weakness 02/13/2021   Bacteremia due to Gram-negative bacteria 09/28/2020   COVID-19 virus infection 09/26/2020   Leucocytosis 09/25/2020   Necrotizing fasciitis (HCC)    Acute respiratory failure with hypoxia (HCC) 08/02/2020   Acute renal failure (HCC) 08/02/2020   Status post amputation of great toe, right (HCC) 03/11/2019   Hyperlipidemia associated with type 2 diabetes mellitus (HCC) 06/16/2017   Mononeuropathy due to underlying disease 02/12/2017   Diabetic foot infection (HCC)    Type 2 diabetes mellitus with stage 3b chronic kidney disease, with long-term current use of insulin (HCC) 11/27/2014   PAD (peripheral artery disease) (HCC) 11/26/2014   Hypertension associated with diabetes (HCC) 11/26/2014   Diabetes mellitus with peripheral vascular disease (HCC) 11/26/2014   Atherosclerosis of native arteries of the extremities with ulceration (HCC) 10/27/2014   Tobacco abuse 10/20/2014   PCP:  Bary Leriche, PA-C Pharmacy:   [id number] - [place], [place] - [address] [place] [address] Phone: [phone] Fax: [phone]  [facility] - [place], [place] - [address] [place] [address] Phone: [phone] Fax: [phone]     Social Determinants of Health (SDOH) Social History: SDOH Screenings   Food Insecurity: No Food Insecurity (11/15/2022)  Housing: Low Risk  (11/15/2022)   Transportation Needs: No Transportation Needs (11/15/2022)  Utilities: Not At Risk (11/15/2022)  Depression (PHQ2-9): Low Risk  (08/11/2022)  Financial Resource Strain: Low Risk  (08/11/2022)  Physical Activity: Inactive (08/11/2022)  Social Connections: Moderately Isolated (08/11/2022)  Stress: No Stress Concern Present (08/11/2022)  Tobacco Use: Medium Risk (11/15/2022)   SDOH Interventions:     Readmission Risk Interventions    11/17/2022   10:33 AM  Readmission Risk Prevention Plan  Transportation Screening Complete  PCP or Specialist Appt within 3-5 Days Complete  HRI or Home Care Consult Complete  Social Work Consult for Recovery Care Planning/Counseling Complete  Palliative Care Screening Not Applicable  Medication Review Oceanographer) Complete

## 2022-11-17 NOTE — Progress Notes (Signed)
Physical Therapy Treatment Patient Details Name: Shirley Chen MRN: [id number] DOB: 05-Dec-1955 Today's Date: 11/17/2022   History of Present Illness 67 yo female admitted with AKI, worsening back pain after falling at home. Imaging revealed: No acute fracture is seen.  2. Within the limitations of this modality, there again appears to  be a broad-based posterior disc bulge with superimposed right  lateral recess posterior disc protrusion at L4-5. Severe right  lateral recess stenosis again likely impinging on the descending  right L5 nerve.  3. At L5-S1, and midline posterior disc protrusion is unchanged to  slightly increased in size. This disc may minimally contact the  bilateral descending S1 nerves within the lateral recesses.  4. Borderline mild left neuroforaminal stenosis at L2-3.   PMH: L BKA, femoral-tibial bypass surgery, bacteremia, falls, chronic back pain/sciatica, cellulitis, obesity, L4-L5 disc protrusion, stenosis, OA, DM,    PT Comments  Pt agreeable to working with therapy. Pain is at its worse during bed mobility and sitting EOB. Pain 5/10 during session. Pt intermittently tearful/upset. With time, she was able to don her prosthesis and ambulate ~50 feet. Discussed d/c plan-she prefers and plans to return home. Will recommend HHPT f/u.     If plan is discharge home, recommend the following: Assist for transportation;Assistance with cooking/housework;Help with stairs or ramp for entrance;A lot of help with walking and/or transfers;A lot of help with bathing/dressing/bathroom   Can travel by private vehicle        Equipment Recommendations  None recommended by PT    Recommendations for Other Services       Precautions / Restrictions Precautions Precautions: Fall Precaution Comments: L BKA-has prosthesis Restrictions Weight Bearing Restrictions: No RLE Weight Bearing: Weight bearing as tolerated     Mobility  Bed Mobility Overal bed mobility: Modified Independent    Rolling: Modified independent (Device/Increase time), Used rails Sidelying to sit: Modified independent (Device/Increase time), Used rails       General bed mobility comments: Increased time. Task is effortful. pain is worse with bed mobility and sitting: 5/10    Transfers Overall transfer level: Needs assistance Equipment used: Rollator (4 wheels) Transfers: Sit to/from Stand Sit to Stand: Contact guard assist           General transfer comment: Cues for safety.    Ambulation/Gait Ambulation/Gait assistance: Contact guard assist Gait Distance (Feet): 50 Feet Assistive device: Rollator (4 wheels) Gait Pattern/deviations: Step-through pattern, Decreased stride length, Trunk flexed       General Gait Details: Ambulation distance limited by pain but tolerable. pain  3/10 while ambulating. pt denied dizziness.   Stairs             Wheelchair Mobility     Tilt Bed    Modified Rankin (Stroke Patients Only)       Balance Overall balance assessment: Needs assistance, History of Falls         Standing balance support: Bilateral upper extremity supported, Reliant on assistive device for balance, During functional activity Standing balance-Leahy Scale: Fair                              Cognition Arousal: Alert Behavior During Therapy: WFL for tasks assessed/performed Overall Cognitive Status: Within Functional Limits for tasks assessed                                 General  Comments: tearful/upset about current condition        Exercises      General Comments        Pertinent Vitals/Pain Pain Assessment Pain Assessment: 0-10 Pain Score: 5  Pain Location: L lower back, radiating bil LEs Pain Descriptors / Indicators: Radiating, Spasm, Sharp Pain Intervention(s): Monitored during session, Repositioned    Home Living                          Prior Function            PT Goals (current goals can now  be found in the care plan section) Progress towards PT goals: Progressing toward goals    Frequency    Min 1X/week      PT Plan      Co-evaluation              AM-PAC PT "6 Clicks" Mobility   Outcome Measure  Help needed turning from your back to your side while in a flat bed without using bedrails?: A Little Help needed moving from lying on your back to sitting on the side of a flat bed without using bedrails?: A Little Help needed moving to and from a bed to a chair (including a wheelchair)?: A Little Help needed standing up from a chair using your arms (e.g., wheelchair or bedside chair)?: A Little Help needed to walk in hospital room?: A Little Help needed climbing 3-5 steps with a railing? : Total 6 Click Score: 16    End of Session Equipment Utilized During Treatment: Gait belt Activity Tolerance: Patient limited by pain Patient left: in bed;with call bell/phone within reach (unable to set bed alarm)   PT Visit Diagnosis: Pain;Repeated falls (R29.6);History of falling (Z91.81);Difficulty in walking, not elsewhere classified (R26.2);Other abnormalities of gait and mobility (R26.89)     Time: 0932-3557 PT Time Calculation (min) (ACUTE ONLY): 22 min  Charges:    $Gait Training: 8-22 mins PT General Charges $$ ACUTE PT VISIT: 1 Visit                        Faye Ramsay, PT Acute Rehabilitation  Office: [phone]

## 2022-11-17 NOTE — Plan of Care (Signed)

## 2022-11-17 NOTE — Progress Notes (Signed)
Called regarding the patient's back pain. She has a history of midline lumbar back pain greater than RLE radicular pain going back to at least 2022 with an MRI in 2022 showing some right 4-5 lateral recess stenosis. PMHx notable for, diastolic heart failure, obesity with a BMI of 38, CAD, PAD, prior gram neg bacteremia, CKD with current AKI. She has had progressive back pain without any weakness, no bowel or bladder dysfunction. She is scheduled to see Dr. Yetta Barre in 3 days in clinic. She had a CT L-spine 6 days ago that showed some stable right 5-1 lateral recess stenosis, no central canal stenosis, has an MRI scheduled.   -no acute neurosurgical intervention indicated, pt is acutely ill in the setting of multiple significant co-morbidities with associated risk without any symptoms of an emergent condition. Given her increase in pain in the setting of leukocytosis and history of gram neg bacteremia, I think the scheduled MRI is wise to r/o an infectious process. Otherwise, recommend continued non-surgical treatment of her back pain while admitted and recovering from her AKI and recommend that she keep her appointment with Dr. Yetta Barre to evaluate further.

## 2022-11-17 NOTE — Progress Notes (Signed)
PROGRESS NOTE    Shirley Chen  QIO:[id number] DOB: Jul 05, 1955 DOA: 11/15/2022 PCP: Bary Leriche, PA-C   Brief Narrative: 67 year old with past medical history significant for osteoarthritis, right foot cellulitis, constipation, diabetes type 2, headache, hyperlipidemia, hypertension, tobacco abuse in remission, CAD, PAD, UTI history of gram-negative bacteremia, class II obesity presents after a fall 4 days prior to admission resulting in L4-L5 disc protrusion with sciatica sent from PCP due to dehydration and AKI.  Due to pain she has been lying down in bed not drinking enough fluids.  She is constipated.  Evaluation in the ED she was found to have a BUN of 2.7, CT lumbar spine done 4 days prior to admission showed degenerative changes at all levels with particular  broad-based posterior disc bulge with superimposed right lateral recess posteriorly and protrusion L4-L5.  L5-S1 midline posterior disc protrusion is unchanged.  Severe right lateral recess stenosis likely impinging descending right L5 nerve.   Assessment & Plan:   Principal Problem:   AKI (acute kidney injury) (HCC) Active Problems:   Tobacco abuse   PAD (peripheral artery disease) (HCC)   Hypertension associated with diabetes (HCC)   Hyperlipidemia   Stage 3a chronic kidney disease (CKD) (HCC)   Aortic atherosclerosis (HCC)   Centrilobular emphysema (HCC)   Acquired absence of left leg below knee (HCC)   Type 2 diabetes mellitus with diabetic neuropathy, with long-term current use of insulin (HCC)   Asymptomatic bacteriuria   Class II obesity   Grade II diastolic dysfunction   Chronic kidney disease, stage 3b (HCC)  1-AKI on CKD stage IIIb: In setting of poor oral intake, hypovolemia.  CR peak to 2.7. now trending down to 1.9 Cr baseline 1.6---1.8 Improved, Cr down to 1.4 better than baseline.  NSL fluids.    L4-L5 disc protrusion, sciatica No weakness on exam.  She refuse Dexamethasone. She completed 7  days prior to admission she report.  PRN oxycodone. Lyrica PT evaluation  Unable to use NSAID due to CKD.  Will try flexeril. Oxycodone today prior working with PT>  I have Order MRI Lumbar spine to further evaluate.  Neurosurgery consulted, no Surgical intervention at this time due to AKI, co morbidities. Patient to follow with Dr Yetta Barre on Thursday.   Tobacco abuse: Counseling.  Nicotine patch.   Hyperlipidemia Continue with Lipitor.   Emphysema PRN nebulizer.   Diabetes type 2 with diabetic neuropathy, long-term use of insulin SSI Hold 70/30 Will start levemir.   Hypertension:  Continue with carvedilol, hydralazine, Norvasc. .   Grade 2 diastolic dysfunction Monitor volume status.  ECHO ordered.   PAD: On lipitor.   History of Left below-knee amputation  Class II obesity Need life style modification.   UTI;  Follow urine culture. Growing E coli Continue with  ceftriaxone.       Estimated body mass index is 37.76 kg/m as calculated from the following:   Height as of this encounter: 5\' 4"  (1.626 m).   Weight as of this encounter: 99.8 kg.   DVT prophylaxis: Lovenox Code Status: Full code Family Communication: Care discussed with patient Disposition Plan:  Status is: Observation The patient remains OBS appropriate and will d/c before 2 midnights.    Consultants:  none  Procedures:  echo  Antimicrobials:    Subjective: Still having a lot of back pain. Unable to move due to pain.   Objective: Vitals:   11/16/22 0904 11/16/22 1157 11/16/22 2044 11/17/22 0537  BP: (!) 124/52 (!) 144/68 Marland Kitchen)  163/66 (!) 175/107  Pulse: 70 72 71 77  Resp: 17 18 18 18   Temp: (!) 97.5 F (36.4 C) 97.7 F (36.5 C) 97.8 F (36.6 C) 98.3 F (36.8 C)  TempSrc: Oral Oral Oral Oral  SpO2: 95% 97% 94% 98%  Weight:      Height:        Intake/Output Summary (Last 24 hours) at 11/17/2022 1117 Last data filed at 11/17/2022 0941 Gross per 24 hour  Intake 2922.5 ml   Output 1900 ml  Net 1022.5 ml   Filed Weights   11/15/22 1232  Weight: 99.8 kg    Examination:  General exam: NAD Respiratory system: CTA Cardiovascular system: S 1, S 2 RRR Gastrointestinal system: BS present, soft, nt Central nervous system: alert Extremities: Left BKA>   Data Reviewed: I have personally reviewed following labs and imaging studies  CBC: Recent Labs  Lab 11/15/22 1321 11/16/22 0424 11/17/22 0734  WBC 14.4* 10.3 10.0  NEUTROABS 11.8*  --   --   HGB 12.8 11.6* 12.2  HCT 40.8 37.2 38.0  MCV 95.1 95.9 94.8  PLT 280 261 261   Basic Metabolic Panel: Recent Labs  Lab 11/15/22 1321 11/16/22 0424 11/17/22 0734  NA 142 141 139  K 4.6 3.9 4.4  CL 109 112* 109  CO2 23 21* 23  GLUCOSE 172* 102* 163*  BUN 76* 57* 37*  CREATININE 2.79* 1.91* 1.43*  CALCIUM 9.0 8.6* 8.6*   GFR: Estimated Creatinine Clearance: 44.4 mL/min (A) (by C-G formula based on SCr of 1.43 mg/dL (H)). Liver Function Tests: Recent Labs  Lab 11/16/22 0424  AST 11*  ALT 12  ALKPHOS 77  BILITOT 0.7  PROT 6.1*  ALBUMIN 3.1*   No results for input(s): "LIPASE", "AMYLASE" in the last 168 hours. No results for input(s): "AMMONIA" in the last 168 hours. Coagulation Profile: No results for input(s): "INR", "PROTIME" in the last 168 hours. Cardiac Enzymes: No results for input(s): "CKTOTAL", "CKMB", "CKMBINDEX", "TROPONINI" in the last 168 hours. BNP (last 3 results) No results for input(s): "PROBNP" in the last 8760 hours. HbA1C: No results for input(s): "HGBA1C" in the last 72 hours. CBG: Recent Labs  Lab 11/16/22 0714 11/16/22 1155 11/16/22 1640 11/16/22 2136 11/17/22 0720  GLUCAP 108* 153* 135* 209* 178*   Lipid Profile: No results for input(s): "CHOL", "HDL", "LDLCALC", "TRIG", "CHOLHDL", "LDLDIRECT" in the last 72 hours. Thyroid Function Tests: No results for input(s): "TSH", "T4TOTAL", "FREET4", "T3FREE", "THYROIDAB" in the last 72 hours. Anemia Panel: No  results for input(s): "VITAMINB12", "FOLATE", "FERRITIN", "TIBC", "IRON", "RETICCTPCT" in the last 72 hours. Sepsis Labs: No results for input(s): "PROCALCITON", "LATICACIDVEN" in the last 168 hours.  Recent Results (from the past 240 hour(s))  Urine Culture     Status: Abnormal (Preliminary result)   Collection Time: 11/15/22  5:00 PM   Specimen: Urine, Random  Result Value Ref Range Status   Specimen Description   Final    URINE, RANDOM Performed at [facility], [address], [place], [place] [address]    Special Requests   Final    NONE Reflexed from [id number] Performed at [facility], [address] [place], [place] [address]    Culture (A)  Final    >=100,000 COLONIES/mL ESCHERICHIA COLI MIC REPEAT (VITEK) Performed at [facility], [address] [place], [place] [address]    Report Status PENDING  Incomplete         Radiology Studies: ECHOCARDIOGRAM  COMPLETE  Result Date: 11/16/2022    ECHOCARDIOGRAM REPORT   Patient Name:   [facility] Date of Exam: 11/16/2022 Medical Rec #:  [id number]      Height:       64.0 in Accession #:    [id number]     Weight:       220.0 lb Date of Birth:  10/23/1955      BSA:          2.037 m Patient Age:    67 years       BP:           153/57 mmHg Patient Gender: F              HR:           70 bpm. Exam Location:  Inpatient Procedure: 2D Echo, Color Doppler and Cardiac Doppler Indications:    Diastolic dysfunction  History:        Patient has prior history of Echocardiogram examinations, most                 recent 08/04/2020. CKD and PAD; Risk Factors:Hypertension,                 Dyslipidemia, Diabetes and Current Smoker.  Sonographer:    Milbert Coulter Referring Phys: [id number] DAVID MANUEL ORTIZ  Sonographer Comments: Patient had to be scanned supine due to severe back pain in any other position. IMPRESSIONS  1. Left ventricular ejection fraction, by estimation, is 55 to 60%. The left ventricle has  normal function. The left ventricle has no regional wall motion abnormalities. There is mild concentric left ventricular hypertrophy. Left ventricular diastolic parameters are consistent with Grade I diastolic dysfunction (impaired relaxation).  2. Right ventricular systolic function is normal. The right ventricular size is not well visualized.  3. The mitral valve is normal in structure. Trivial mitral valve regurgitation. No evidence of mitral stenosis.  4. The aortic valve was not well visualized. Aortic valve regurgitation is not visualized. No aortic stenosis is present.  5. The inferior vena cava is normal in size with greater than 50% respiratory variability, suggesting right atrial pressure of 3 mmHg. FINDINGS  Left Ventricle: Left ventricular ejection fraction, by estimation, is 55 to 60%. The left ventricle has normal function. The left ventricle has no regional wall motion abnormalities. The left ventricular internal cavity size was normal in size. There is  mild concentric left ventricular hypertrophy. Left ventricular diastolic parameters are consistent with Grade I diastolic dysfunction (impaired relaxation). Right Ventricle: The right ventricular size is not well visualized. No increase in right ventricular wall thickness. Right ventricular systolic function is normal. Left Atrium: Left atrial size was normal in size. Right Atrium: Right atrial size was normal in size. Pericardium: There is no evidence of pericardial effusion. Presence of epicardial fat layer. Mitral Valve: The mitral valve is normal in structure. Trivial mitral valve regurgitation. No evidence of mitral valve stenosis. Tricuspid Valve: The tricuspid valve is not well visualized. Tricuspid valve regurgitation is not demonstrated. No evidence of tricuspid stenosis. Aortic Valve: The aortic valve was not well visualized. Aortic valve regurgitation is not visualized. No aortic stenosis is present. Aortic valve mean gradient measures 3.0  mmHg. Aortic valve peak gradient measures 5.9 mmHg. Aortic valve area, by VTI measures 3.56 cm. Pulmonic Valve: The pulmonic valve was not well visualized. Pulmonic valve regurgitation is not visualized. No evidence of pulmonic stenosis. Aorta: The aortic root is normal in size and structure. Venous:  The inferior vena cava is normal in size with greater than 50% respiratory variability, suggesting right atrial pressure of 3 mmHg. IAS/Shunts: No atrial level shunt detected by color flow Doppler.  LEFT VENTRICLE PLAX 2D LVIDd:         4.90 cm   Diastology LVIDs:         3.10 cm   LV e' medial:    8.27 cm/s LV PW:         1.10 cm   LV E/e' medial:  7.9 LV IVS:        1.00 cm   LV e' lateral:   6.85 cm/s LVOT diam:     2.10 cm   LV E/e' lateral: 9.5 LV SV:         86 LV SV Index:   42 LVOT Area:     3.46 cm  RIGHT VENTRICLE RV S prime:     14.80 cm/s TAPSE (M-mode): 2.3 cm LEFT ATRIUM             Index        RIGHT ATRIUM           Index LA diam:        3.10 cm 1.52 cm/m   RA Area:     12.60 cm LA Vol (A2C):   47.2 ml 23.17 ml/m  RA Volume:   29.10 ml  14.28 ml/m LA Vol (A4C):   48.5 ml 23.81 ml/m LA Biplane Vol: 48.2 ml 23.66 ml/m  AORTIC VALVE AV Area (Vmax):    2.89 cm AV Area (Vmean):   2.75 cm AV Area (VTI):     3.56 cm AV Vmax:           121.00 cm/s AV Vmean:          81.400 cm/s AV VTI:            0.240 m AV Peak Grad:      5.9 mmHg AV Mean Grad:      3.0 mmHg LVOT Vmax:         101.00 cm/s LVOT Vmean:        64.700 cm/s LVOT VTI:          0.247 m LVOT/AV VTI ratio: 1.03  AORTA Ao Root diam: 3.00 cm Ao Asc diam:  3.30 cm MITRAL VALVE MV Area (PHT): 3.46 cm    SHUNTS MV Decel Time: 219 msec    Systemic VTI:  0.25 m MV E velocity: 65.10 cm/s  Systemic Diam: 2.10 cm MV A velocity: 87.00 cm/s MV E/A ratio:  0.75 Kardie Tobb DO Electronically signed by Thomasene Ripple DO Signature Date/Time: 11/16/2022/10:59:15 AM    Final         Scheduled Meds:  amLODipine  10 mg Oral Daily   atorvastatin  40 mg Oral  Daily   carvedilol  25 mg Oral BID WC   enoxaparin (LOVENOX) injection  40 mg Subcutaneous Q24H   hydrALAZINE  50 mg Oral TID   insulin aspart  0-15 Units Subcutaneous TID WC   lidocaine  1 patch Transdermal Daily   linaclotide  145 mcg Oral QAC breakfast   polyethylene glycol  17 g Oral Daily   pregabalin  75 mg Oral BID   senna-docusate  1 tablet Oral BID   Continuous Infusions:  cefTRIAXone (ROCEPHIN)  IV 2 g (11/17/22 0931)     LOS: 1 day    Time spent: 35 minutes     Bonita Quin, MD  [facility]   If 7PM-7AM, please contact night-coverage [email or web address]  11/17/2022, 11:17 AM

## 2022-11-18 DIAGNOSIS — N179 Acute kidney failure, unspecified: Secondary | ICD-10-CM | POA: Diagnosis not present

## 2022-11-18 LAB — URINE CULTURE: Culture: >=100000 — AB

## 2022-11-18 LAB — GLUCOSE, CAPILLARY
Glucose-Capillary: 137 mg/dL — ABNORMAL HIGH (ref 70–99)
Glucose-Capillary: 218 mg/dL — ABNORMAL HIGH (ref 70–99)
Glucose-Capillary: 160 mg/dL — ABNORMAL HIGH (ref 70–99)
Glucose-Capillary: 181 mg/dL — ABNORMAL HIGH (ref 70–99)

## 2022-11-18 LAB — BASIC METABOLIC PANEL
Anion gap: 8 (ref 5–15)
BUN: 27 mg/dL — ABNORMAL HIGH (ref 8–23)
CO2: 22 mmol/L (ref 22–32)
Calcium: 8.8 mg/dL — ABNORMAL LOW (ref 8.9–10.3)
Chloride: 111 mmol/L (ref 98–111)
Creatinine, Ser: 1.38 mg/dL — ABNORMAL HIGH (ref 0.44–1.00)
GFR, Estimated: 42 mL/min — ABNORMAL LOW (ref >60)
Glucose, Bld: 249 mg/dL — ABNORMAL HIGH (ref 70–99)
Potassium: 4.3 mmol/L (ref 3.5–5.1)
Sodium: 141 mmol/L (ref 135–145)

## 2022-11-18 MED ORDER — HYDROMORPHONE HCL 1 MG/ML IJ SOLN
1.0000 mg | Freq: Once | INTRAMUSCULAR | Status: DC | PRN
  Filled 2022-11-18 (×2): qty 1

## 2022-11-18 MED ORDER — OXYCODONE HCL 5 MG PO TABS
5.0000 mg | ORAL_TABLET | ORAL | Status: DC | PRN
  Administered 2022-11-18 (×3): 10 mg via ORAL
  Administered 2022-11-19: 5 mg via ORAL
  Administered 2022-11-19 – 2022-11-20 (×8): 10 mg via ORAL
  Filled 2022-11-18 (×12): qty 2

## 2022-11-18 MED ORDER — INSULIN DETEMIR 100 UNIT/ML ~~LOC~~ SOLN
25.0000 [IU] | Freq: Every day | SUBCUTANEOUS | Status: DC
  Administered 2022-11-19 – 2022-11-20 (×2): 25 [IU] via SUBCUTANEOUS
  Filled 2022-11-18 (×2): qty 0.25

## 2022-11-18 MED ORDER — INSULIN ASPART 100 UNIT/ML IJ SOLN
3.0000 [IU] | Freq: Three times a day (TID) | INTRAMUSCULAR | Status: DC
  Administered 2022-11-18 – 2022-11-20 (×4): 3 [IU] via SUBCUTANEOUS

## 2022-11-18 NOTE — Inpatient Diabetes Management (Signed)
Inpatient Diabetes Program Recommendations  AACE/ADA: New Consensus Statement on Inpatient Glycemic Control (2015)  Target Ranges:  Prepandial:   less than 140 mg/dL      Peak postprandial:   less than 180 mg/dL (1-2 hours)      Critically ill patients:  140 - 180 mg/dL   Lab Results  Component Value Date   GLUCAP 137 (H) 11/18/2022   HGBA1C 8.7 (A) 11/11/2022    Review of Glycemic Control  Latest Reference Range & Units 11/17/22 07:20 11/17/22 11:33 11/17/22 16:44 11/17/22 20:14 11/18/22 07:23  Glucose-Capillary 70 - 99 mg/dL 253 (H) 664 (H) 403 (H) 219 (H) 137 (H)   Diabetes history: DM  Outpatient Diabetes medications:  Rybelsus 3 mg daily, Humalog 75/25- 33 units bid Current orders for Inpatient glycemic control:  Novolog 0-15 units tid with meals Levemir 15 units daily  Inpatient Diabetes Program Recommendations:   Consider adding Novolog meal coverage 3 units tid with meals while in the hospital.    Thanks,  Beryl Meager, RN, BC-ADM Inpatient Diabetes Coordinator Pager [phone]  (8a-5p)

## 2022-11-18 NOTE — Progress Notes (Signed)
PROGRESS NOTE    Shirley Chen  ZHY:[id number] DOB: 01/21/1956 DOA: 11/15/2022 PCP: Bary Leriche, PA-C   Brief Narrative: 67 year old with past medical history significant for osteoarthritis, right foot cellulitis, constipation, diabetes type 2, headache, hyperlipidemia, hypertension, tobacco abuse in remission, CAD, PAD, UTI history of gram-negative bacteremia, class II obesity presents after a fall 4 days prior to admission resulting in L4-L5 disc protrusion with sciatica sent from PCP due to dehydration and AKI.  Due to pain she has been lying down in bed not drinking enough fluids.  She is constipated.  Evaluation in the ED she was found to have a BUN of 2.7, CT lumbar spine done 4 days prior to admission showed degenerative changes at all levels with particular  broad-based posterior disc bulge with superimposed right lateral recess posteriorly and protrusion L4-L5.  L5-S1 midline posterior disc protrusion is unchanged.  Severe right lateral recess stenosis likely impinging descending right L5 nerve.   Assessment & Plan:   Principal Problem:   AKI (acute kidney injury) (HCC) Active Problems:   Tobacco abuse   PAD (peripheral artery disease) (HCC)   Hypertension associated with diabetes (HCC)   Hyperlipidemia   Stage 3a chronic kidney disease (CKD) (HCC)   Aortic atherosclerosis (HCC)   Centrilobular emphysema (HCC)   Acquired absence of left leg below knee (HCC)   Type 2 diabetes mellitus with diabetic neuropathy, with long-term current use of insulin (HCC)   Asymptomatic bacteriuria   Class II obesity   Grade II diastolic dysfunction   Chronic kidney disease, stage 3b (HCC)  1-AKI on CKD stage IIIb: In setting of poor oral intake, hypovolemia.  CR peak to 2.7. now trending down to 1.9 Cr baseline 1.6---1.8 Improved, Cr down to 1.4 better than baseline.  NSL fluids.  Stable.   L4-L5 disc protrusion, sciatica -No weakness on exam.  -She refuse Dexamethasone. She  completed 7 days prior to admission she report.  -PRN oxycodone. Lyrica -PT evaluation  -Unable to use NSAID due to CKD.  -Continue with  flexeril. Oxycodone , change oxycodone to 5-10 mg PRN.  -Neurosurgery consulted, no Surgical intervention at this time due to AKI, co morbidities. Patient to follow with Dr Yetta Barre on Thursday.  -MRI negative for infection, Right central disc extrusion at L4-L5 compresses the traversing right L5 nerve root in the subarticular zone and displaces the traversing right S1 nerve root. No significant change from 2022. Far lateral disc contacts the exited left L5 nerve root in the left extraforaminal zone. -Pain management.    Tobacco abuse: Counseling.  Nicotine patch.   Hyperlipidemia Continue with Lipitor.   Emphysema PRN nebulizer.   Diabetes type 2 with diabetic neuropathy, long-term use of insulin SSI Hold 70/30 Increase  levemir. Add meal coverage  Hypertension:  Continue with carvedilol, hydralazine, Norvasc. .   Grade 2 diastolic dysfunction Monitor volume status.  ECHO ordered.   PAD: On lipitor.   History of Left below-knee amputation  Class II obesity Need life style modification.   UTI;  Follow urine culture. Growing E coli Continue with  ceftriaxone.       Estimated body mass index is 37.76 kg/m as calculated from the following:   Height as of this encounter: 5\' 4"  (1.626 m).   Weight as of this encounter: 99.8 kg.   DVT prophylaxis: Lovenox Code Status: Full code Family Communication: Care discussed with patient Disposition Plan:  Status is: Observation The patient remains OBS appropriate and will d/c before 2 midnights. If  she is able to ambulate plan to discharge tomorrow , late discharge. If no will require rehab    Consultants:  none  Procedures:  echo  Antimicrobials:    Subjective: Report flexeril and oxycodone is helping but oxycodone dont last long. Will increase dose.   Objective: Vitals:    11/17/22 0537 11/17/22 1135 11/17/22 2058 11/18/22 0607  BP: (!) 175/107 (!) 144/59 (!) 154/71 (!) 160/84  Pulse: 77 67 72 73  Resp: 18 18 18 18   Temp: 98.3 F (36.8 C) 97.9 F (36.6 C) 98.4 F (36.9 C) (!) 97.5 F (36.4 C)  TempSrc: Oral Oral Oral Oral  SpO2: 98% 94% 93% 95%  Weight:      Height:        Intake/Output Summary (Last 24 hours) at 11/18/2022 1306 Last data filed at 11/18/2022 1000 Gross per 24 hour  Intake 1445.22 ml  Output 3250 ml  Net -1804.78 ml   Filed Weights   11/15/22 1232  Weight: 99.8 kg    Examination:  General exam: NAD Respiratory system: CTA Cardiovascular system: S 1., S 2 RRR Gastrointestinal system: BS present, soft, nt  Central nervous system: Alert, non focal.  Extremities: Left BKA>   Data Reviewed: I have personally reviewed following labs and imaging studies  CBC: Recent Labs  Lab 11/15/22 1321 11/16/22 0424 11/17/22 0734  WBC 14.4* 10.3 10.0  NEUTROABS 11.8*  --   --   HGB 12.8 11.6* 12.2  HCT 40.8 37.2 38.0  MCV 95.1 95.9 94.8  PLT 280 261 261   Basic Metabolic Panel: Recent Labs  Lab 11/15/22 1321 11/16/22 0424 11/17/22 0734 11/18/22 0922  NA 142 141 139 141  K 4.6 3.9 4.4 4.3  CL 109 112* 109 111  CO2 23 21* 23 22  GLUCOSE 172* 102* 163* 249*  BUN 76* 57* 37* 27*  CREATININE 2.79* 1.91* 1.43* 1.38*  CALCIUM 9.0 8.6* 8.6* 8.8*   GFR: Estimated Creatinine Clearance: 46 mL/min (A) (by C-G formula based on SCr of 1.38 mg/dL (H)). Liver Function Tests: Recent Labs  Lab 11/16/22 0424  AST 11*  ALT 12  ALKPHOS 77  BILITOT 0.7  PROT 6.1*  ALBUMIN 3.1*   No results for input(s): "LIPASE", "AMYLASE" in the last 168 hours. No results for input(s): "AMMONIA" in the last 168 hours. Coagulation Profile: No results for input(s): "INR", "PROTIME" in the last 168 hours. Cardiac Enzymes: No results for input(s): "CKTOTAL", "CKMB", "CKMBINDEX", "TROPONINI" in the last 168 hours. BNP (last 3 results) No results  for input(s): "PROBNP" in the last 8760 hours. HbA1C: No results for input(s): "HGBA1C" in the last 72 hours. CBG: Recent Labs  Lab 11/17/22 1133 11/17/22 1644 11/17/22 2014 11/18/22 0723 11/18/22 1125  GLUCAP 211* 193* 219* 137* 218*   Lipid Profile: No results for input(s): "CHOL", "HDL", "LDLCALC", "TRIG", "CHOLHDL", "LDLDIRECT" in the last 72 hours. Thyroid Function Tests: No results for input(s): "TSH", "T4TOTAL", "FREET4", "T3FREE", "THYROIDAB" in the last 72 hours. Anemia Panel: No results for input(s): "VITAMINB12", "FOLATE", "FERRITIN", "TIBC", "IRON", "RETICCTPCT" in the last 72 hours. Sepsis Labs: No results for input(s): "PROCALCITON", "LATICACIDVEN" in the last 168 hours.  Recent Results (from the past 240 hour(s))  Urine Culture     Status: Abnormal   Collection Time: 11/15/22  5:00 PM   Specimen: Urine, Random  Result Value Ref Range Status   Specimen Description   Final    URINE, RANDOM Performed at [facility], [address]  [place], [place] [address]    Special Requests   Final    NONE Reflexed from [id number] Performed at [facility], [address], [place], [place] [address]    Culture >=100,000 COLONIES/mL ESCHERICHIA COLI (A)  Final   Report Status 11/18/2022 FINAL  Final   Organism ID, Bacteria ESCHERICHIA COLI (A)  Final      Susceptibility   Escherichia coli - MIC*    AMPICILLIN <=2.0 SENSITIVE Sensitive     CEFAZOLIN <=4.0 SENSITIVE Sensitive     CEFEPIME <=0.12 SENSITIVE Sensitive     CEFTRIAXONE <=0.25 SENSITIVE Sensitive     CIPROFLOXACIN <=0.25 SENSITIVE Sensitive     GENTAMICIN <=1.0 SENSITIVE Sensitive     IMIPENEM <=0.25 SENSITIVE Sensitive     NITROFURANTOIN <=16.0 SENSITIVE Sensitive     TRIMETH/SULFA <=20.0 SENSITIVE Sensitive     AMPICILLIN/SULBACTAM <=2.0 SENSITIVE Sensitive     PIP/TAZO <=4.0 SENSITIVE Sensitive     * >=100,000 COLONIES/mL ESCHERICHIA COLI         Radiology  Studies: MR LUMBAR SPINE WO CONTRAST  Result Date: 11/17/2022 CLINICAL DATA:  Lumbar radiculopathy, trauma. Lumbar pain since late July. EXAM: MRI LUMBAR SPINE WITHOUT CONTRAST TECHNIQUE: Multiplanar, multisequence MR imaging of the lumbar spine was performed. No intravenous contrast was administered. COMPARISON:  Lumbar spine MRI 07/30/2020. FINDINGS: Segmentation: Conventional numbering is assumed with 5 non-rib-bearing, lumbar type vertebral bodies. Alignment:  Normal. Vertebrae:  No fracture, evidence of discitis, or bone lesion. Conus medullaris and cauda equina: Conus extends to the L1-2 level. Conus and cauda equina appear normal. Paraspinal and other soft tissues: Mild fatty atrophy of the paraspinal muscles. Disc levels: T12-L1:  Normal. L1-L2:  Normal. L2-L3:  Normal. L3-L4: Small disc bulge without spinal canal stenosis or neural foraminal narrowing. L4-L5: Right central disc extrusion with mild caudal migration compresses the traversing right L5 nerve root in the subarticular zone and displaces the traversing right S1 nerve root. This finding is not significantly changed from 2022. Mild bilateral neural foraminal narrowing. L5-S1: Small central disc protrusion without spinal canal stenosis. Far lateral disc contacts the exited left L5 nerve root in the left extraforaminal zone. IMPRESSION: 1. Right central disc extrusion at L4-L5 compresses the traversing right L5 nerve root in the subarticular zone and displaces the traversing right S1 nerve root. No significant change from 2022. 2. Far lateral disc contacts the exited left L5 nerve root in the left extraforaminal zone. Electronically Signed   By: Orvan Falconer M.D.   On: 11/17/2022 19:52        Scheduled Meds:  amLODipine  10 mg Oral Daily   atorvastatin  40 mg Oral Daily   carvedilol  25 mg Oral BID WC   enoxaparin (LOVENOX) injection  40 mg Subcutaneous Q24H   hydrALAZINE  50 mg Oral TID   insulin aspart  0-15 Units Subcutaneous TID  WC   insulin aspart  3 Units Subcutaneous TID WC   [START ON 11/19/2022] insulin detemir  25 Units Subcutaneous Daily   lidocaine  1 patch Transdermal Daily   linaclotide  145 mcg Oral QAC breakfast   polyethylene glycol  17 g Oral Daily   pregabalin  75 mg Oral BID   senna-docusate  1 tablet Oral BID   Continuous Infusions:  cefTRIAXone (ROCEPHIN)  IV 2 g (11/18/22 0947)     LOS: 2 days    Time spent: 35 minutes     A , MD [facility]   If 7PM-7AM, please contact  night-coverage [email or web address]  11/18/2022, 1:06 PM

## 2022-11-18 NOTE — TOC Progression Note (Addendum)
Transition of Care [facility]) - Progression Note    Patient Details  Name: Shirley Chen MRN: [id number] Date of Birth: [birth year]  Transition of Care [facility]) CM/SW Contact  Amada Jupiter, LCSW Phone Number: 11/18/2022, 10:43 AM  Clinical Narrative:     Met with pt today after OT session which was very limited due to pain.  She is now agreeable with CSW beginning SNF bed search as she is unsure that she will be able to manage at home at current functional level.  Have alerted MD and will begin SNF placement work up.  ADDENDUM: Have provide pt with list of SNFs who can offer bed if she chooses to pursue this plan.   Expected Discharge Plan:  (TBD) Barriers to Discharge: Continued Medical Work up  Expected Discharge Plan and Services In-house Referral: Clinical Social Work     Living arrangements for the past 2 months: Single Family Home                                       Social Determinants of Health (SDOH) Interventions SDOH Screenings   Food Insecurity: No Food Insecurity (11/15/2022)  Housing: Low Risk  (11/15/2022)  Transportation Needs: No Transportation Needs (11/15/2022)  Utilities: Not At Risk (11/15/2022)  Depression (PHQ2-9): Low Risk  (08/11/2022)  Financial Resource Strain: Low Risk  (08/11/2022)  Physical Activity: Inactive (08/11/2022)  Social Connections: Moderately Isolated (08/11/2022)  Stress: No Stress Concern Present (08/11/2022)  Tobacco Use: Medium Risk (11/15/2022)    Readmission Risk Interventions    11/17/2022   10:33 AM  Readmission Risk Prevention Plan  Transportation Screening Complete  PCP or Specialist Appt within 3-5 Days Complete  HRI or Home Care Consult Complete  Social Work Consult for Recovery Care Planning/Counseling Complete  Palliative Care Screening Not Applicable  Medication Review [employer]) Complete

## 2022-11-18 NOTE — Progress Notes (Signed)
Occupational Therapy Treatment Patient Details Name: Shirley Chen MRN: [id number] DOB: [birth year] Today's Date: 11/18/2022   History of present illness 67 yo female admitted with AKI, worsening back pain after falling at home. Imaging revealed: No acute fracture is seen.  2. Within the limitations of this modality, there again appears to  be a broad-based posterior disc bulge with superimposed right  lateral recess posterior disc protrusion at L4-5. Severe right  lateral recess stenosis again likely impinging on the descending  right L5 nerve.  3. At L5-S1, and midline posterior disc protrusion is unchanged to  slightly increased in size. This disc may minimally contact the  bilateral descending S1 nerves within the lateral recesses.  4. Borderline mild left neuroforaminal stenosis at L2-3.   PMH: L BKA, femoral-tibial bypass surgery, bacteremia, falls, chronic back pain/sciatica, cellulitis, obesity, L4-L5 disc protrusion, stenosis, OA, DM,   OT comments  Unfortunately, patient was unable to show progress toward OT goals this session.  Primary reason for this was intolerable pain once patient began to mobilize from supine to sitting.  Patient was premedicated and OT did coordinate with patient's nurse and attempts to optimize patient's comfort for therapy.  However patient disclosed that she had significantly increased pain due to an MRI last evening.  Patient was trained on logroll to protect back and performed side-lying to sit with minimal assist.  Unfortunately patient became too uncomfortable while sitting edge of bed to engage in any meaningful or functional activity and after 3 attempts to lock prosthetic inner sleeve to prostatic, OT assisted patient back to supine as patient was tearful and moaning in pain.  Pt continues to demonstrate good rehab potential as long as her pain is controlled , and would benefit from continued skilled OT to increase safety and independence with ADLs and functional  transfers to allow pt to return home safely and reduce caregiver burden and fall risk.       If plan is discharge home, recommend the following:  Assist for transportation;Assistance with cooking/housework;A lot of help with bathing/dressing/bathroom;Help with stairs or ramp for entrance   Equipment Recommendations  None recommended by OT    Recommendations for Other Services      Precautions / Restrictions Precautions Precautions: Fall Precaution Comments: L BKA-has prosthesis Restrictions Weight Bearing Restrictions: Yes RLE Weight Bearing: Weight bearing as tolerated       Mobility Bed Mobility Overal bed mobility: Needs Assistance Bed Mobility: Rolling Rolling: Used rails, Contact guard assist (Pt denied knowledge of "log roll" technique. Pt was educated on method.) Sidelying to sit: Min assist, Used rails     Sit to sidelying: Min assist, Used rails General bed mobility comments: Increased time. Task is effortful. pain is worse with bed mobility and sitting.    Transfers                         Balance Overall balance assessment: Needs assistance, History of Falls Sitting-balance support: Feet unsupported, Single extremity supported Sitting balance-Leahy Scale: Fair Sitting balance - Comments: Pt rocking forward and back and crying at EOB. No LOB                                   ADL either performed or assessed with clinical judgement   ADL   Eating/Feeding: Independent;Bed level   Grooming: Set up;Bed level  Lower Body Dressing: Sitting/lateral leans;Total assistance Lower Body Dressing Details (indicate cue type and reason): Attemtped to don prosthetic x 3 at EOB but unable to fully engage lock and pt tearful and becoming more anxious from pain. Pt was unable to assist with donning of inner liner while EOB due to pain. Due to pain, pt was assisted back to bed.   Toilet Transfer Details (indicate cue type and  reason): Pt unable to tolerate this session despite coordination with RN for maximally possible pain management.  Pt expressed that she has increased pain today due to MRI last night.   Toileting - Clothing Manipulation Details (indicate cue type and reason): On pure wick.     Functional mobility during ADLs: Minimal assistance;Cueing for sequencing (bed only)      Extremity/Trunk Assessment Upper Extremity Assessment Upper Extremity Assessment: Overall WFL for tasks assessed            Vision Baseline Vision/History: 3 Glaucoma Ability to See in Adequate Light: 2 Moderately impaired Vision Assessment?: Vision impaired- to be further tested in functional context Additional Comments: "I'm going blind"   Perception     Praxis      Cognition Arousal: Alert Behavior During Therapy: WFL for tasks assessed/performed Overall Cognitive Status: Within Functional Limits for tasks assessed                                 General Comments: Expressed frustration with situation and regrets BKA saying that she is just a burden. Tearful statin, "I just can't take it any more."        Exercises      Shoulder Instructions       General Comments      Pertinent Vitals/ Pain       Pain Assessment Pain Assessment: Faces Faces Pain Scale: Hurts worst Pain Location: L lower back, radiating bil LEs.  RN checked with pt a trest just prior to OT arriving and pt reported 3/10. Pt reached an apparent 10/10 once EOB. Pain Descriptors / Indicators: Crying, Grimacing, Guarding, Moaning Pain Intervention(s): Repositioned, Limited activity within patient's tolerance, Monitored during session, Premedicated before session  Home Living                                          Prior Functioning/Environment              Frequency  Min 1X/week        Progress Toward Goals  OT Goals(current goals can now be found in the care plan section)  Progress  towards OT goals: Not progressing toward goals - comment  Acute Rehab OT Goals Patient Stated Goal: Be able to walk to the bathroom and be able to climb 3 steps so pt can enter her home. OT Goal Formulation: With patient Time For Goal Achievement: 11/30/22 Potential to Achieve Goals: Good  Plan      Co-evaluation                 AM-PAC OT "6 Clicks" Daily Activity     Outcome Measure   Help from another person eating meals?: None Help from another person taking care of personal grooming?: A Little Help from another person toileting, which includes using toliet, bedpan, or urinal?: A Lot Help from another person bathing (including washing, rinsing, drying)?: A  Lot Help from another person to put on and taking off regular upper body clothing?: A Little Help from another person to put on and taking off regular lower body clothing?: A Lot 6 Click Score: 16    End of Session    OT Visit Diagnosis: Pain;History of falling (Z91.81)   Activity Tolerance Patient limited by pain   Patient Left in bed;with call bell/phone within reach;with bed alarm set   Nurse Communication Other (comment) (Coordinated with RN for pain management. Notified MD that pt may need SNF if unable to mobilize today or tomorrow independently.)        Time: 0841-0908 OT Time Calculation (min): 27 min  Charges: OT General Charges $OT Visit: 1 Visit OT Treatments $Self Care/Home Management : 8-22 mins $Therapeutic Activity: 8-22 mins  Victorino Dike, OT Acute Rehab Services Office: [phone] 11/18/2022  Theodoro Clock 11/18/2022, 9:19 AM

## 2022-11-18 NOTE — NC FL2 (Signed)
[place] MEDICAID FL2 LEVEL OF CARE FORM     IDENTIFICATION  Patient Name: Shirley Chen Birthdate: December 19, 1955 Sex: female Admission Date (Current Location): 11/15/2022  [facility] and [place] Number:  Producer, television/film/video and Address:  [facility],  [address], [place] [address]      Provider Number: [id number]  Attending Physician Name and Address:  Alba Cory, MD  Relative Name and Phone Number:  Harden Mo [phone]    Current Level of Care: Hospital Recommended Level of Care: Skilled Nursing Facility Prior Approval Number:    Date Approved/Denied:   PASRR Number: [id number]  Discharge Plan: SNF    Current Diagnoses: Patient Active Problem List   Diagnosis Date Noted   AKI (acute kidney injury) (HCC) 11/15/2022   Asymptomatic bacteriuria 11/15/2022   Class II obesity 11/15/2022   Grade II diastolic dysfunction 11/15/2022   Chronic kidney disease, stage 3b (HCC) 11/15/2022   Acquired absence of left leg below knee (HCC) 08/11/2022   Type 2 diabetes mellitus with diabetic neuropathy, with long-term current use of insulin (HCC) 08/11/2022   Elevated alkaline phosphatase level 05/13/2022   Constipation due to opioid therapy 05/13/2022   Aortic atherosclerosis (HCC) 11/08/2021   Centrilobular emphysema (HCC) 11/08/2021   Coronary artery calcification seen on CT scan 11/08/2021   Postmenopausal 11/08/2021   Calculus of gallbladder without cholecystitis without obstruction 11/08/2021   Absolute anemia 08/17/2021   Family history of thyroid disease 08/17/2021   Epidermoid cyst of neck 08/17/2021   Other constipation 08/17/2021   Sepsis (HCC)- e coli pyelonephritis 03/27/2021   Bilateral hydronephrosis 03/26/2021   Hyperlipidemia    Stage 3a chronic kidney disease (CKD) (HCC)    Weakness 02/13/2021   Bacteremia due to Gram-negative bacteria 09/28/2020   COVID-19 virus infection 09/26/2020   Leucocytosis 09/25/2020   Necrotizing  fasciitis (HCC)    Acute respiratory failure with hypoxia (HCC) 08/02/2020   Acute renal failure (HCC) 08/02/2020   Status post amputation of great toe, right (HCC) 03/11/2019   Hyperlipidemia associated with type 2 diabetes mellitus (HCC) 06/16/2017   Mononeuropathy due to underlying disease 02/12/2017   Diabetic foot infection (HCC)    Type 2 diabetes mellitus with stage 3b chronic kidney disease, with long-term current use of insulin (HCC) 11/27/2014   PAD (peripheral artery disease) (HCC) 11/26/2014   Hypertension associated with diabetes (HCC) 11/26/2014   Diabetes mellitus with peripheral vascular disease (HCC) 11/26/2014   Atherosclerosis of native arteries of the extremities with ulceration (HCC) 10/27/2014   Tobacco abuse 10/20/2014    Orientation RESPIRATION BLADDER Height & Weight     Self, Time, Situation, Place  Normal Continent Weight: 220 lb (99.8 kg) Height:  5\' 4"  (162.6 cm)  BEHAVIORAL SYMPTOMS/MOOD NEUROLOGICAL BOWEL NUTRITION STATUS      Continent Diet (regular)  AMBULATORY STATUS COMMUNICATION OF NEEDS Skin   Limited Assist Verbally Normal                       Personal Care Assistance Level of Assistance  Bathing, Dressing Bathing Assistance: Limited assistance   Dressing Assistance: Limited assistance     Functional Limitations Info  Sight, Hearing, Speech Sight Info: Adequate Hearing Info: Adequate Speech Info: Adequate    SPECIAL CARE FACTORS FREQUENCY  PT (By licensed PT), OT (By licensed OT)     PT Frequency: 5x/wk OT Frequency: 5x/wk            Contractures Contractures Info: Not present  Additional Factors Info  Code Status, Allergies, Insulin Sliding Scale Code Status Info: Full Allergies Info: Losartan, Morphine And Codeine, Sulfa Antibiotics   Insulin Sliding Scale Info: see MAR       Current Medications (11/18/2022):  This is the current hospital active medication list Current Facility-Administered Medications   Medication Dose Route Frequency Provider Last Rate Last Admin   acetaminophen (TYLENOL) tablet 650 mg  650 mg Oral Q6H PRN Bobette Mo, MD   650 mg at 11/16/22 0209   Or   acetaminophen (TYLENOL) suppository 650 mg  650 mg Rectal Q6H PRN Bobette Mo, MD       amLODipine (NORVASC) tablet 10 mg  10 mg Oral Daily Bobette Mo, MD   10 mg at 11/18/22 0945   atorvastatin (LIPITOR) tablet 40 mg  40 mg Oral Daily Bobette Mo, MD   40 mg at 11/18/22 0944   carvedilol (COREG) tablet 25 mg  25 mg Oral BID WC Bobette Mo, MD   25 mg at 11/18/22 0748   cefTRIAXone (ROCEPHIN) 2 g in sodium chloride 0.9 % 100 mL IVPB  2 g Intravenous Daily Regalado, Belkys A, MD 200 mL/hr at 11/18/22 0947 2 g at 11/18/22 0947   cyclobenzaprine (FLEXERIL) tablet 5 mg  5 mg Oral TID PRN Regalado, Belkys A, MD   5 mg at 11/17/22 2247   enoxaparin (LOVENOX) injection 40 mg  40 mg Subcutaneous Q24H Herby Abraham, RPH   40 mg at 11/17/22 2247   hydrALAZINE (APRESOLINE) tablet 50 mg  50 mg Oral TID Bobette Mo, MD   50 mg at 11/18/22 0944   HYDROmorphone (DILAUDID) injection 1 mg  1 mg Intravenous Once PRN Regalado, Belkys A, MD       insulin aspart (novoLOG) injection 0-15 Units  0-15 Units Subcutaneous TID WC Bobette Mo, MD   2 Units at 11/18/22 0748   insulin detemir (LEVEMIR) injection 15 Units  15 Units Subcutaneous Daily Regalado, Belkys A, MD   15 Units at 11/18/22 1050   lidocaine (LIDODERM) 5 % 1 patch  1 patch Transdermal Daily Bobette Mo, MD   1 patch at 11/15/22 2316   linaclotide (LINZESS) capsule 145 mcg  145 mcg Oral QAC breakfast Bobette Mo, MD   145 mcg at 11/18/22 0748   naloxone [facility]) injection 0.4 mg  0.4 mg Intravenous PRN Bobette Mo, MD       nicotine (NICODERM CQ - dosed in mg/24 hours) patch 21 mg  21 mg Transdermal Daily PRN Bobette Mo, MD       ondansetron [facility]) tablet 4 mg  4 mg Oral Q6H PRN Bobette Mo, MD       Or   ondansetron [facility]) injection 4 mg  4 mg Intravenous Q6H PRN Bobette Mo, MD       oxyCODONE (Oxy IR/ROXICODONE) immediate release tablet 5-10 mg  5-10 mg Oral Q4H PRN Regalado, Belkys A, MD   10 mg at 11/18/22 0944   polyethylene glycol (MIRALAX / GLYCOLAX) packet 17 g  17 g Oral Daily Bobette Mo, MD   17 g at 11/18/22 0945   pregabalin (LYRICA) capsule 75 mg  75 mg Oral BID Bobette Mo, MD   75 mg at 11/18/22 0945   senna-docusate (Senokot-S) tablet 1 tablet  1 tablet Oral BID Bobette Mo, MD   1 tablet at 11/18/22 0945     Discharge Medications: Please  see discharge summary for a list of discharge medications.  Relevant Imaging Results:  Relevant Lab Results:   Additional Information [id number]  Amada Jupiter, LCSW

## 2022-11-19 DIAGNOSIS — Z794 Long term (current) use of insulin: Secondary | ICD-10-CM

## 2022-11-19 DIAGNOSIS — N1832 Chronic kidney disease, stage 3b: Secondary | ICD-10-CM

## 2022-11-19 DIAGNOSIS — E1140 Type 2 diabetes mellitus with diabetic neuropathy, unspecified: Secondary | ICD-10-CM

## 2022-11-19 DIAGNOSIS — I5189 Other ill-defined heart diseases: Secondary | ICD-10-CM | POA: Diagnosis not present

## 2022-11-19 DIAGNOSIS — N179 Acute kidney failure, unspecified: Secondary | ICD-10-CM | POA: Diagnosis not present

## 2022-11-19 LAB — GLUCOSE, CAPILLARY
Glucose-Capillary: 143 mg/dL — ABNORMAL HIGH (ref 70–99)
Glucose-Capillary: 224 mg/dL — ABNORMAL HIGH (ref 70–99)
Glucose-Capillary: 113 mg/dL — ABNORMAL HIGH (ref 70–99)
Glucose-Capillary: 135 mg/dL — ABNORMAL HIGH (ref 70–99)

## 2022-11-19 NOTE — Plan of Care (Signed)
  P Problem: Safety: Goal: Ability to remain free from injury will improve Outcome: Progressing   Problem: Coping: Goal: Level of anxiety will decrease Outcome: Not Progressing   Problem: Activity: Goal: Risk for activity intolerance will decrease Outcome: Not Met (add Reason)   Problem: Nutrition: Goal: Adequate nutrition will be maintained Outcome: Completed/Met   Problem: Elimination: Goal: Will not experience complications related to bowel motility Outcome: Completed/Met

## 2022-11-19 NOTE — Hospital Course (Addendum)
AKI and back pain after recent fall resulting in discs bulging with sciatica. If DC home 8/13 family will be able to pick up after 7 30 PM. She would go home vs rehab depending on mobility.   67 year old with past medical history significant for osteoarthritis, right foot cellulitis, constipation, diabetes type 2, headache, hyperlipidemia, hypertension, tobacco abuse in remission, CAD, PAD, UTI history of gram-negative bacteremia, class II obesity presents after a fall 4 days prior to admission resulting in L4-L5 disc protrusion with sciatica sent from PCP due to dehydration and AKI. Due to pain she has been lying down in bed not drinking enough fluids. She is constipated. Evaluation in the ED she was found to have a BUN of 2.7, CT lumbar spine done 4 days prior to admission showed degenerative changes at all levels with particular broad-based posterior disc bulge with superimposed right lateral recess posteriorly and protrusion L4-L5. L5-S1 midline posterior disc protrusion is unchanged. Severe right lateral recess stenosis likely impinging descending right L5 nerve.   Consultants ***  Procedures ***

## 2022-11-19 NOTE — Progress Notes (Signed)
Occupational Therapy Treatment Patient Details Name: Shirley Chen MRN: [id number] DOB: May 23, 1955 Today's Date: 11/19/2022   History of present illness 67 yo female admitted with AKI, worsening back pain after falling at home. Imaging revealed: No acute fracture is seen.  2. Within the limitations of this modality, there again appears to  be a broad-based posterior disc bulge with superimposed right  lateral recess posterior disc protrusion at L4-5. Severe right  lateral recess stenosis again likely impinging on the descending  right L5 nerve.  3. At L5-S1, and midline posterior disc protrusion is unchanged to  slightly increased in size. This disc may minimally contact the  bilateral descending S1 nerves within the lateral recesses.  4. Borderline mild left neuroforaminal stenosis at L2-3.   PMH: L BKA, femoral-tibial bypass surgery, bacteremia, falls, chronic back pain/sciatica, cellulitis, obesity, L4-L5 disc protrusion, stenosis, OA, DM,   OT comments  Pt was seen for therapy at bed level, as she reported increased radiating low back pain down to LE when progressive activity attempted. She performed multiple rolls left & right with supervision. She was instructed on bridging in bed in order to donn a pair of briefs; she required mod assist overall in this regard. Lastly, she performed hair combing and hair washing using a waterless shampoo cap with set-up assist in the semi-fowler's position. Continue OT plan of care. Patient will benefit from continued inpatient follow up therapy, <3 hours/day.       If plan is discharge home, recommend the following:  Assist for transportation;Assistance with cooking/housework;A lot of help with bathing/dressing/bathroom;Help with stairs or ramp for entrance   Equipment Recommendations  None recommended by OT    Recommendations for Other Services      Precautions / Restrictions Precautions Precaution Comments: L BKA-has prosthesis Restrictions Weight  Bearing Restrictions: No       Mobility Bed Mobility Overal bed mobility: Needs Assistance Bed Mobility: Rolling Rolling: Supervision, Used rails         General bed mobility comments: She performed multiple rolls left & right with supervision; she used the bed rails to assist with rolling.    Transfers      General transfer comment: deferred, due to pt presenting with increased pain with activity         ADL either performed or assessed with clinical judgement   ADL Overall ADL's : Needs assistance/impaired Eating/Feeding: Independent;Bed level   Grooming: Set up;Bed level Grooming Details (indicate cue type and reason): She performed hair combing and hair washing using a waterless shampoo cap, while in the semi-fowler's position in bed.     Lower Body Bathing: Moderate assistance;Bed level Lower Body Bathing Details (indicate cue type and reason): based on clinical judgement Upper Body Dressing : Set up;Supervision/safety;Bed level Upper Body Dressing Details (indicate cue type and reason): simulated Lower Body Dressing: Maximal assistance Lower Body Dressing Details (indicate cue type and reason): She was instructed on bridging in bed, in order to assist with donning underwear/brief.                      Cognition Arousal: Alert Behavior During Therapy: WFL for tasks assessed/performed Overall Cognitive Status: Within Functional Limits for tasks assessed                      Pertinent Vitals/ Pain       Pain Assessment Pain Assessment: Faces Pain Score: 5  Pain Location: L lower back radiating down leg Pain  Descriptors / Indicators: Crying, Grimacing, Guarding Pain Intervention(s): Limited activity within patient's tolerance, Repositioned, Monitored during session, Other (comment) (Pt reported receiving pain medication prior to session)         Frequency  Min 1X/week        Progress Toward Goals  OT Goals(current goals can now be  found in the care plan section)     Acute Rehab OT Goals Patient Stated Goal: decreased pain OT Goal Formulation: With patient Time For Goal Achievement: 11/30/22 Potential to Achieve Goals: Good  Plan         AM-PAC OT "6 Clicks" Daily Activity     Outcome Measure   Help from another person eating meals?: None Help from another person taking care of personal grooming?: A Little Help from another person toileting, which includes using toliet, bedpan, or urinal?: A Lot Help from another person bathing (including washing, rinsing, drying)?: A Lot Help from another person to put on and taking off regular upper body clothing?: A Little Help from another person to put on and taking off regular lower body clothing?: A Lot 6 Click Score: 16    End of Session Equipment Utilized During Treatment: Other (comment) (N/A)  OT Visit Diagnosis: Pain;History of falling (Z91.81)   Activity Tolerance Patient limited by pain   Patient Left in bed;with call bell/phone within reach;with bed alarm set   Nurse Communication Mobility status        Time: 1610-9604 OT Time Calculation (min): 21 min  Charges: OT General Charges $OT Visit: 1 Visit OT Treatments $Self Care/Home Management : 8-22 mins     Reuben Likes, OTR/L 11/19/2022, 4:00 PM

## 2022-11-19 NOTE — TOC Progression Note (Addendum)
Transition of Care [facility]) - Progression Note    Patient Details  Name: Shirley Chen MRN: [id number] Date of Birth: [birth year]  Transition of Care [facility]) CM/SW Contact  Amada Jupiter, LCSW Phone Number: 11/19/2022, 12:35 PM  Clinical Narrative:    Pt has accepted SNF bed at [facility] and insurance authorization begun.  ADDENDUM: Have received insurance auth and facility states they can admit pt tomorrow.  Have alerted MD/ RN/ pt.   Expected Discharge Plan:  (TBD) Barriers to Discharge: Continued Medical Work up  Expected Discharge Plan and Services In-house Referral: Clinical Social Work     Living arrangements for the past 2 months: Single Family Home                                       Social Determinants of Health (SDOH) Interventions SDOH Screenings   Food Insecurity: No Food Insecurity (11/15/2022)  Housing: Low Risk  (11/15/2022)  Transportation Needs: No Transportation Needs (11/15/2022)  Utilities: Not At Risk (11/15/2022)  Depression (PHQ2-9): Low Risk  (08/11/2022)  Financial Resource Strain: Low Risk  (08/11/2022)  Physical Activity: Inactive (08/11/2022)  Social Connections: Moderately Isolated (08/11/2022)  Stress: No Stress Concern Present (08/11/2022)  Tobacco Use: Medium Risk (11/15/2022)    Readmission Risk Interventions    11/17/2022   10:33 AM  Readmission Risk Prevention Plan  Transportation Screening Complete  PCP or Specialist Appt within 3-5 Days Complete  HRI or Home Care Consult Complete  Social Work Consult for Recovery Care Planning/Counseling Complete  Palliative Care Screening Not Applicable  Medication Review Oceanographer) Complete

## 2022-11-19 NOTE — Progress Notes (Signed)
  Progress Note   Patient: Shirley Chen ION:[id number] DOB: 01-13-1956 DOA: 11/15/2022     3 DOS: the patient was seen and examined on 11/19/2022   Brief hospital course:  Assessment and Plan: AKI on CKD stage IIIb -- resolved In setting of poor oral intake, hypovolemia.  Cr baseline 1.6---1.8 Better than baseline, no further treatment   L4-L5 disc protrusion, sciatica Ambulatory dysfunction No weakness on exam. Lifts both legs off the bed. No s/s of cord syndrome. Refused Dexamethasone. She completed 7 days prior to admission by report.  Neurosurgery consulted, no surgical intervention at this time due to AKI, co morbidities. Patient to follow with Dr Yetta Barre as outpatient MRI negative for infection, Right central disc extrusion at L4-L5 compresses the traversing right L5 nerve root in the subarticular zone and displaces the traversing right S1 nerve root. No significant change from 2022.  Plan for SNF   Tobacco abuse Nicotine patch.    Emphysema Stable. PRN nebulizer.    Diabetes type 2 with diabetic neuropathy, long-term use of insulin CBG stable, continue current Rx   Essential hypertension  Continue with carvedilol, hydralazine, Norvasc. .    Grade 2 diastolic dysfunction Stable    PMH Left below-knee amputation   Class II obesity   UTI;  Treated       Subjective:  Still has back pain, no better No bladder dysfunction No numbness Some diarrhea w/ Linzess  Physical Exam: Vitals:   11/18/22 0607 11/18/22 1327 11/18/22 2050 11/19/22 0518  BP: (!) 160/84 (!) 157/66 (!) 141/70 (!) 157/66  Pulse: 73 67 71 70  Resp: 18 18 18 18   Temp: (!) 97.5 F (36.4 C) 98 F (36.7 C) 98 F (36.7 C) 98 F (36.7 C)  TempSrc: Oral Oral Oral Oral  SpO2: 95% 97% 96% 92%  Weight:      Height:       Physical Exam Vitals reviewed.  Constitutional:      General: She is not in acute distress.    Appearance: She is not ill-appearing or toxic-appearing.  Cardiovascular:      Rate and Rhythm: Normal rate and regular rhythm.     Heart sounds: No murmur heard. Pulmonary:     Effort: Pulmonary effort is normal. No respiratory distress.     Breath sounds: No wheezing, rhonchi or rales.  Neurological:     Mental Status: She is alert.  Psychiatric:        Mood and Affect: Mood normal.        Behavior: Behavior normal.     Data Reviewed: CBG stable  Family Communication: none present  Disposition: Status is: Inpatient Remains inpatient appropriate because: back pain     Time spent: 20 minutes  Author: Brendia Sacks, MD 11/19/2022 11:27 AM  For on call review [email or web address].

## 2022-11-19 NOTE — Progress Notes (Signed)
Physical Therapy Treatment Patient Details Name: Shirley Chen MRN: [id number] DOB: [birth year] Today's Date: 11/19/2022   History of Present Illness 67 yo female admitted with AKI, worsening back pain after falling at home. Imaging revealed: No acute fracture is seen.  2. Within the limitations of this modality, there again appears to  be a broad-based posterior disc bulge with superimposed right  lateral recess posterior disc protrusion at L4-5. Severe right  lateral recess stenosis again likely impinging on the descending  right L5 nerve.  3. At L5-S1, and midline posterior disc protrusion is unchanged to  slightly increased in size. This disc may minimally contact the  bilateral descending S1 nerves within the lateral recesses.  4. Borderline mild left neuroforaminal stenosis at L2-3.   PMH: L BKA, femoral-tibial bypass surgery, bacteremia, falls, chronic back pain/sciatica, cellulitis, obesity, L4-L5 disc protrusion, stenosis, OA, DM,    PT Comments  Pt was able to move from supine to sitting at edge of bed using bedrails with head of bed elevated without physical assistance. Once sitting at edge of bed pt reported unbearable pain and began crying, she was assisted back to supine. Hot packs applied to low back. Pt reports she's not able to tolerate any further activity 2* pain. She does not have 24/7 assistance at home. Pt is requesting DC to SNF.      If plan is discharge home, recommend the following: Assist for transportation;Assistance with cooking/housework;Help with stairs or ramp for entrance;A lot of help with walking and/or transfers;A lot of help with bathing/dressing/bathroom   Can travel by private vehicle     No  Equipment Recommendations  None recommended by PT    Recommendations for Other Services       Precautions / Restrictions Precautions Precautions: Fall Precaution Comments: L BKA-has prosthesis Restrictions Weight Bearing Restrictions: No RLE Weight Bearing:  Weight bearing as tolerated     Mobility  Bed Mobility   Bed Mobility: Rolling, Sidelying to Sit Rolling: Modified independent (Device/Increase time), Used rails Sidelying to sit: Supervision, Used rails, HOB elevated     Sit to sidelying: Min assist General bed mobility comments: Increased time. Task is effortful. pain is worse with bed mobility and sitting. Pain unbearable, pt crying,  in sitting so pt returned to bed.  Min A for BLEs into bed. Min A to scoot up in bed with pt pulling on rails with BUEs.    Transfers                        Ambulation/Gait                   Stairs             Wheelchair Mobility     Tilt Bed    Modified Rankin (Stroke Patients Only)       Balance Overall balance assessment: Needs assistance, History of Falls Sitting-balance support: Feet unsupported, Single extremity supported Sitting balance-Leahy Scale: Fair                                      Cognition Arousal: Alert Behavior During Therapy: WFL for tasks assessed/performed Overall Cognitive Status: Within Functional Limits for tasks assessed  General Comments: tearful/upset about current condition        Exercises      General Comments        Pertinent Vitals/Pain Pain Assessment Pain Score: 10-Worst pain ever Pain Location: L lower back Pain Descriptors / Indicators: Crying, Grimacing, Guarding, Moaning Pain Intervention(s): Limited activity within patient's tolerance, Monitored during session, Premedicated before session, Heat applied    Home Living                          Prior Function            PT Goals (current goals can now be found in the care plan section) Acute Rehab PT Goals Patient Stated Goal: less pain. return home PT Goal Formulation: With patient Time For Goal Achievement: 11/30/22 Potential to Achieve Goals: Good Progress towards PT goals:  Not progressing toward goals - comment (pain limiting activity tolerance)    Frequency    Min 1X/week      PT Plan      Co-evaluation              AM-PAC PT "6 Clicks" Mobility   Outcome Measure  Help needed turning from your back to your side while in a flat bed without using bedrails?: A Little Help needed moving from lying on your back to sitting on the side of a flat bed without using bedrails?: A Little Help needed moving to and from a bed to a chair (including a wheelchair)?: A Lot Help needed standing up from a chair using your arms (e.g., wheelchair or bedside chair)?: A Lot Help needed to walk in hospital room?: Total Help needed climbing 3-5 steps with a railing? : Total 6 Click Score: 12    End of Session   Activity Tolerance: Patient limited by pain Patient left: in bed;with call bell/phone within reach Nurse Communication: Mobility status PT Visit Diagnosis: Pain;Repeated falls (R29.6);History of falling (Z91.81);Difficulty in walking, not elsewhere classified (R26.2);Other abnormalities of gait and mobility (R26.89)     Time: 1610-9604 PT Time Calculation (min) (ACUTE ONLY): 11 min  Charges:    $Therapeutic Activity: 8-22 mins PT General Charges $$ ACUTE PT VISIT: 1 Visit                     Tamala Ser PT 11/19/2022  Acute Rehabilitation Services  Office [phone]

## 2022-11-20 DIAGNOSIS — G8929 Other chronic pain: Secondary | ICD-10-CM

## 2022-11-20 DIAGNOSIS — R262 Difficulty in walking, not elsewhere classified: Secondary | ICD-10-CM | POA: Diagnosis not present

## 2022-11-20 DIAGNOSIS — M545 Low back pain, unspecified: Secondary | ICD-10-CM | POA: Diagnosis not present

## 2022-11-20 DIAGNOSIS — N179 Acute kidney failure, unspecified: Secondary | ICD-10-CM | POA: Diagnosis not present

## 2022-11-20 LAB — GLUCOSE, CAPILLARY: Glucose-Capillary: 184 mg/dL — ABNORMAL HIGH (ref 70–99)

## 2022-11-20 MED ORDER — OXYCODONE-ACETAMINOPHEN 5-325 MG PO TABS: 1.0000 | ORAL_TABLET | Freq: Three times a day (TID) | ORAL | 0 refills | Status: DC | PRN

## 2022-11-20 MED ORDER — LIDOCAINE 5 % EX PTCH: 1.0000 | MEDICATED_PATCH | Freq: Every day | CUTANEOUS | Status: DC

## 2022-11-20 MED ORDER — INSULIN DETEMIR 100 UNIT/ML ~~LOC~~ SOLN: 25.0000 [IU] | Freq: Every day | SUBCUTANEOUS | Status: DC

## 2022-11-20 MED ORDER — INSULIN ASPART 100 UNIT/ML IJ SOLN: 3.0000 [IU] | Freq: Three times a day (TID) | INTRAMUSCULAR | Status: DC

## 2022-11-20 MED ORDER — CYCLOBENZAPRINE HCL 5 MG PO TABS: 5.0000 mg | ORAL_TABLET | Freq: Three times a day (TID) | ORAL | 0 refills | Status: DC | PRN

## 2022-11-20 NOTE — TOC Transition Note (Signed)
Transition of Care [facility]) - CM/SW Discharge Note   Patient Details  Name: Shirley Chen MRN: [id number] Date of Birth: [birth year]  [facility]) CM/SW Contact:  Amada Jupiter, LCSW Phone Number: 11/20/2022, 12:33 PM   Clinical Narrative:     Pt medically cleared for dc today to [facility] and have received insurance authorization.  Pt and family aware and agreeable.  PTAR called at 12:30pm. RN to call report to [phone].  No further TOC needs.  Final next level of care: Skilled Nursing Facility Barriers to Discharge: Barriers Resolved   Patient Goals and CMS Choice      Discharge Placement     Existing PASRR number confirmed : 11/18/22          Patient chooses bed at: [facility] Patient to be transferred to facility by: PTAR Name of family member notified: pt notifying family Patient and family notified of of transfer: 11/20/22  Discharge Plan and Services Additional resources added to the After Visit Summary for   In-house Referral: Clinical Social Work                                   Social Determinants of Health (SDOH) Interventions SDOH Screenings   Food Insecurity: No Food Insecurity (11/15/2022)  Housing: Low Risk  (11/15/2022)  Transportation Needs: No Transportation Needs (11/15/2022)  Utilities: Not At Risk (11/15/2022)  Depression (PHQ2-9): Low Risk  (08/11/2022)  Financial Resource Strain: Low Risk  (08/11/2022)  Physical Activity: Inactive (08/11/2022)  Social Connections: Moderately Isolated (08/11/2022)  Stress: No Stress Concern Present (08/11/2022)  Tobacco Use: Medium Risk (11/15/2022)     Readmission Risk Interventions    11/17/2022   10:33 AM  Readmission Risk Prevention Plan  Transportation Screening Complete  PCP or Specialist Appt within 3-5 Days Complete  HRI or Home Care Consult Complete  Social Work Consult for Recovery Care Planning/Counseling Complete  Palliative Care Screening Not Applicable   Medication Review Oceanographer) Complete

## 2022-11-20 NOTE — Progress Notes (Signed)
Attempted to call report to [facility], no answer

## 2022-11-20 NOTE — Discharge Summary (Addendum)
Physician Discharge Summary   Patient: Shirley Chen MRN: [id number] DOB: [birth year]  Admit date:     11/15/2022  Discharge date: 11/20/22  Discharge Physician: Brendia Sacks   PCP: Bary Leriche, PA-C   Recommendations at discharge:   Acute on chronic back pain.  See below.  Diabetes care.  Adjustments as below.  Started on long-acting insulin.  Discharge Diagnoses: Principal Problem:   AKI (acute kidney injury) (HCC) Active Problems:   Tobacco abuse   PAD (peripheral artery disease) (HCC)   Hypertension associated with diabetes (HCC)   Hyperlipidemia   Stage 3a chronic kidney disease (CKD) (HCC)   Aortic atherosclerosis (HCC)   Centrilobular emphysema (HCC)   Acquired absence of left leg below knee (HCC)   Type 2 diabetes mellitus with diabetic neuropathy, with long-term current use of insulin (HCC)   Asymptomatic bacteriuria   Class II obesity   Grade II diastolic dysfunction   Chronic kidney disease, stage 3b (HCC) L4-L5 disc protrusion, sciatica, acute on chronic back pain Ambulatory dysfunction  Resolved Problems:   * No resolved hospital problems. *  Hospital Course: 67 year old woman PMH including long-term sciatica, presented after previous fall with increased back pain and sciatica resulting in ambulatory dysfunction and inability to walk without loss of strength.  Evaluation revealed AKI and the patient was admitted for further treatment of this.  AKI resolved with IV fluids.  Patient was seen by physical therapy with recommendation for SNF.  Patient reviewed with neurosurgery who recommended outpatient evaluation.  Consultants Neurosurgery  Procedures None  AKI on CKD stage IIIb -- resolved In setting of poor oral intake, hypovolemia. Creatinine 2.79 on admission.  Cr baseline 1.6---1.8 Better than baseline, no further treatment   L4-L5 disc protrusion, sciatica, acute on chronic back pain Ambulatory dysfunction No weakness on exam. Lifts both  legs off the bed. No s/s of cord syndrome. Refused dexamethasone. She completed 7 days prior to admission by report.  Neurosurgery consulted, no surgical intervention at this time due to AKI, co morbidities. Patient to follow with Dr Yetta Barre as outpatient MRI negative for infection, Right central disc extrusion at L4-L5 compresses the traversing right L5 nerve root in the subarticular zone and displaces the traversing right S1 nerve root. No significant change from 2022.  Plan for SNF   Tobacco abuse Nicotine patch.    Emphysema Stable. PRN nebulizer.    Diabetes type 2 with diabetic neuropathy, long-term use of insulin. Hgb A1c 8.7 CBG stable, continue current Rx   Essential hypertension  Continue with carvedilol, hydralazine, Norvasc. .    Grade 2 diastolic dysfunction Stable    PMH Left below-knee amputation   Class II obesity   E coli UTI  Treated        Pain control - [place] Controlled Substance Reporting System database was reviewed.    Disposition: Skilled nursing facility Diet recommendation:  Cardiac and Carb modified diet DISCHARGE MEDICATION: Allergies as of 11/20/2022       Reactions   Losartan Nausea And Vomiting   Morphine And Codeine    Pt states " I just got really sick"   Sulfa Antibiotics Itching        Medication List     STOP taking these medications    amoxicillin-clavulanate 875-125 MG tablet Commonly known as: AUGMENTIN   diclofenac Sodium 1 % Gel Commonly known as: Voltaren Arthritis Pain   docusate sodium 100 MG capsule Commonly known as: COLACE   GABAPENTIN PO   HumaLOG Mix  75/25 KwikPen (75-25) 100 UNIT/ML KwikPen Generic drug: Insulin Lispro Prot & Lispro   methylPREDNISolone 4 MG Tbpk tablet Commonly known as: MEDROL DOSEPAK       TAKE these medications    Accu-Chek Guide test strip Generic drug: glucose blood USE 1 STRIP TO CHECK BLOOD  GLUCOSE 3 TIMES DAILY   amLODipine 10 MG tablet Commonly known as:  NORVASC TAKE 1 TABLET BY MOUTH DAILY   atorvastatin 40 MG tablet Commonly known as: LIPITOR TAKE 1 TABLET BY MOUTH DAILY   blood glucose meter kit and supplies Kit Dispense based on patient and insurance preference. Use up to four times daily as directed. (FOR ICD-9 250.00, 250.01).   calcium carbonate (dosed in mg elemental calcium) 1250 MG/5ML Susp Take 5 mLs (500 mg of elemental calcium total) by mouth every 6 (six) hours as needed for indigestion.   carvedilol 25 MG tablet Commonly known as: COREG TAKE 1 TABLET BY MOUTH TWICE  DAILY   Cholecalciferol 25 MCG (1000 UT) tablet Take 1,000 Units by mouth daily.   cyclobenzaprine 5 MG tablet Commonly known as: FLEXERIL Take 1 tablet (5 mg total) by mouth 3 (three) times daily as needed for muscle spasms.   ezetimibe 10 MG tablet Commonly known as: ZETIA TAKE 1 TABLET BY MOUTH DAILY   ferrous sulfate 325 (65 FE) MG EC tablet Take 325 mg by mouth daily with breakfast.   furosemide 40 MG tablet Commonly known as: LASIX TAKE 1 TABLET BY MOUTH DAILY ON  MONDAY, WEDNESDAY,FRIDAY,  SATURDAY AND SUNDAY.(ALTERNATE  WITH 20MG  TABLET) What changed: See the new instructions.   glucose monitoring kit monitoring kit 1 each by Does not apply route 4 (four) times daily - after meals and at bedtime. 1 month Diabetic Testing Supplies for QAC-QHS accuchecks. Any brand OK. Diagnosis E11.65   hydrALAZINE 50 MG tablet Commonly known as: APRESOLINE TAKE 1 TABLET BY MOUTH 3 TIMES  DAILY   insulin aspart 100 UNIT/ML injection Commonly known as: novoLOG Inject 3 Units into the skin 3 (three) times daily with meals.   insulin detemir 100 UNIT/ML injection Commonly known as: LEVEMIR Inject 0.25 mLs (25 Units total) into the skin daily. Start taking on: November 21, 2022   lidocaine 5 % Commonly known as: LIDODERM Place 1 patch onto the skin daily. Remove & Discard patch within 12 hours or as directed by MD Start taking on: November 21, 2022    linaclotide 145 MCG Caps capsule Commonly known as: LINZESS Take 1 capsule (145 mcg total) by mouth daily before breakfast.   oxyCODONE-acetaminophen 5-325 MG tablet Commonly known as: PERCOCET/ROXICET Take 1 tablet by mouth every 8 (eight) hours as needed for severe pain.   potassium chloride SA 20 MEQ tablet Commonly known as: KLOR-CON M TAKE 1 TABLET BY MOUTH DAILY ON  MONDAY, WEDNESDAY, FRIDAY,  SATURDAY, AND SUNDAY   pregabalin 75 MG capsule Commonly known as: LYRICA TAKE 1 CAPSULE BY MOUTH TWICE  DAILY   Rybelsus 3 MG Tabs Generic drug: Semaglutide Take 1 tablet by mouth daily.   VITAMIN B 12 PO Take 1 tablet by mouth daily.        Contact information for follow-up providers     Allwardt, Alyssa M, PA-C. Schedule an appointment as soon as possible for a visit in 2 week(s).   Specialty: Physician Assistant Contact information: [address] [place] [address] [phone]         Arman Bogus, MD. Schedule an appointment as soon  as possible for a visit in 1 week(s).   Specialty: Neurosurgery Contact information: [address] [place] [address] [phone]              Contact information for after-discharge care     Destination     [name] Preferred SNF .   Service: Skilled Nursing Contact information: [address] [place] [address] [phone]                    Feels ok  Discharge Exam: Ceasar Mons Weights   11/15/22 1232  Weight: 99.8 kg   Physical Exam Vitals reviewed.  Constitutional:      General: She is not in acute distress.    Appearance: She is not ill-appearing or toxic-appearing.  Cardiovascular:     Rate and Rhythm: Normal rate and regular rhythm.     Heart sounds: No murmur heard. Pulmonary:     Effort: Pulmonary effort is normal. No respiratory distress.     Breath sounds: No wheezing, rhonchi or rales.  Neurological:     Mental Status:  She is alert.  Psychiatric:        Mood and Affect: Mood normal.        Behavior: Behavior normal.      Condition at discharge: good  The results of significant diagnostics from this hospitalization (including imaging, microbiology, ancillary and laboratory) are listed below for reference.   Imaging Studies: MR LUMBAR SPINE WO CONTRAST  Result Date: 11/17/2022 CLINICAL DATA:  Lumbar radiculopathy, trauma. Lumbar pain since late July. EXAM: MRI LUMBAR SPINE WITHOUT CONTRAST TECHNIQUE: Multiplanar, multisequence MR imaging of the lumbar spine was performed. No intravenous contrast was administered. COMPARISON:  Lumbar spine MRI 07/30/2020. FINDINGS: Segmentation: Conventional numbering is assumed with 5 non-rib-bearing, lumbar type vertebral bodies. Alignment:  Normal. Vertebrae:  No fracture, evidence of discitis, or bone lesion. Conus medullaris and cauda equina: Conus extends to the L1-2 level. Conus and cauda equina appear normal. Paraspinal and other soft tissues: Mild fatty atrophy of the paraspinal muscles. Disc levels: T12-L1:  Normal. L1-L2:  Normal. L2-L3:  Normal. L3-L4: Small disc bulge without spinal canal stenosis or neural foraminal narrowing. L4-L5: Right central disc extrusion with mild caudal migration compresses the traversing right L5 nerve root in the subarticular zone and displaces the traversing right S1 nerve root. This finding is not significantly changed from 2022. Mild bilateral neural foraminal narrowing. L5-S1: Small central disc protrusion without spinal canal stenosis. Far lateral disc contacts the exited left L5 nerve root in the left extraforaminal zone. IMPRESSION: 1. Right central disc extrusion at L4-L5 compresses the traversing right L5 nerve root in the subarticular zone and displaces the traversing right S1 nerve root. No significant change from 2022. 2. Far lateral disc contacts the exited left L5 nerve root in the left extraforaminal zone. Electronically Signed    By: Orvan Falconer M.D.   On: 11/17/2022 19:52   ECHOCARDIOGRAM COMPLETE  Result Date: 11/16/2022    ECHOCARDIOGRAM REPORT   Patient Name:   [facility] Date of Exam: 11/16/2022 Medical Rec #:  [id number]      Height:       64.0 in Accession #:    [id number]     Weight:       220.0 lb Date of Birth:  1955/11/09      BSA:          2.037 m Patient Age:    69 years  BP:           153/57 mmHg Patient Gender: F              HR:           70 bpm. Exam Location:  Inpatient Procedure: 2D Echo, Color Doppler and Cardiac Doppler Indications:    Diastolic dysfunction  History:        Patient has prior history of Echocardiogram examinations, most                 recent 08/04/2020. CKD and PAD; Risk Factors:Hypertension,                 Dyslipidemia, Diabetes and Current Smoker.  Sonographer:    Milbert Coulter Referring Phys: [id number] DAVID MANUEL ORTIZ  Sonographer Comments: Patient had to be scanned supine due to severe back pain in any other position. IMPRESSIONS  1. Left ventricular ejection fraction, by estimation, is 55 to 60%. The left ventricle has normal function. The left ventricle has no regional wall motion abnormalities. There is mild concentric left ventricular hypertrophy. Left ventricular diastolic parameters are consistent with Grade I diastolic dysfunction (impaired relaxation).  2. Right ventricular systolic function is normal. The right ventricular size is not well visualized.  3. The mitral valve is normal in structure. Trivial mitral valve regurgitation. No evidence of mitral stenosis.  4. The aortic valve was not well visualized. Aortic valve regurgitation is not visualized. No aortic stenosis is present.  5. The inferior vena cava is normal in size with greater than 50% respiratory variability, suggesting right atrial pressure of 3 mmHg. FINDINGS  Left Ventricle: Left ventricular ejection fraction, by estimation, is 55 to 60%. The left ventricle has normal function. The left ventricle has no  regional wall motion abnormalities. The left ventricular internal cavity size was normal in size. There is  mild concentric left ventricular hypertrophy. Left ventricular diastolic parameters are consistent with Grade I diastolic dysfunction (impaired relaxation). Right Ventricle: The right ventricular size is not well visualized. No increase in right ventricular wall thickness. Right ventricular systolic function is normal. Left Atrium: Left atrial size was normal in size. Right Atrium: Right atrial size was normal in size. Pericardium: There is no evidence of pericardial effusion. Presence of epicardial fat layer. Mitral Valve: The mitral valve is normal in structure. Trivial mitral valve regurgitation. No evidence of mitral valve stenosis. Tricuspid Valve: The tricuspid valve is not well visualized. Tricuspid valve regurgitation is not demonstrated. No evidence of tricuspid stenosis. Aortic Valve: The aortic valve was not well visualized. Aortic valve regurgitation is not visualized. No aortic stenosis is present. Aortic valve mean gradient measures 3.0 mmHg. Aortic valve peak gradient measures 5.9 mmHg. Aortic valve area, by VTI measures 3.56 cm. Pulmonic Valve: The pulmonic valve was not well visualized. Pulmonic valve regurgitation is not visualized. No evidence of pulmonic stenosis. Aorta: The aortic root is normal in size and structure. Venous: The inferior vena cava is normal in size with greater than 50% respiratory variability, suggesting right atrial pressure of 3 mmHg. IAS/Shunts: No atrial level shunt detected by color flow Doppler.  LEFT VENTRICLE PLAX 2D LVIDd:         4.90 cm   Diastology LVIDs:         3.10 cm   LV e' medial:    8.27 cm/s LV PW:         1.10 cm   LV E/e' medial:  7.9 LV IVS:  1.00 cm   LV e' lateral:   6.85 cm/s LVOT diam:     2.10 cm   LV E/e' lateral: 9.5 LV SV:         86 LV SV Index:   42 LVOT Area:     3.46 cm  RIGHT VENTRICLE RV S prime:     14.80 cm/s TAPSE (M-mode):  2.3 cm LEFT ATRIUM             Index        RIGHT ATRIUM           Index LA diam:        3.10 cm 1.52 cm/m   RA Area:     12.60 cm LA Vol (A2C):   47.2 ml 23.17 ml/m  RA Volume:   29.10 ml  14.28 ml/m LA Vol (A4C):   48.5 ml 23.81 ml/m LA Biplane Vol: 48.2 ml 23.66 ml/m  AORTIC VALVE AV Area (Vmax):    2.89 cm AV Area (Vmean):   2.75 cm AV Area (VTI):     3.56 cm AV Vmax:           121.00 cm/s AV Vmean:          81.400 cm/s AV VTI:            0.240 m AV Peak Grad:      5.9 mmHg AV Mean Grad:      3.0 mmHg LVOT Vmax:         101.00 cm/s LVOT Vmean:        64.700 cm/s LVOT VTI:          0.247 m LVOT/AV VTI ratio: 1.03  AORTA Ao Root diam: 3.00 cm Ao Asc diam:  3.30 cm MITRAL VALVE MV Area (PHT): 3.46 cm    SHUNTS MV Decel Time: 219 msec    Systemic VTI:  0.25 m MV E velocity: 65.10 cm/s  Systemic Diam: 2.10 cm MV A velocity: 87.00 cm/s MV E/A ratio:  0.75 Kardie Tobb DO Electronically signed by Thomasene Ripple DO Signature Date/Time: 11/16/2022/10:59:15 AM    Final    CT Lumbar Spine Wo Contrast  Result Date: 11/11/2022 CLINICAL DATA:  Lumbar radiculopathy. Trauma. Chronic right leg and sciatica pain. EXAM: CT LUMBAR SPINE WITHOUT CONTRAST TECHNIQUE: Multidetector CT imaging of the lumbar spine was performed without intravenous contrast administration. Multiplanar CT image reconstructions were also generated. RADIATION DOSE REDUCTION: This exam was performed according to the departmental dose-optimization program which includes automated exposure control, adjustment of the mA and/or kV according to patient size and/or use of iterative reconstruction technique. COMPARISON:  CT abdomen and pelvis 03/26/2021, MRI lumbar spine 07/30/2020 FINDINGS: Segmentation: There are 5 non-rib-bearing lumbar-type vertebral bodies. Alignment: Normal sagittal alignment. Minimal levocurvature centered at L4-5. Vertebrae: Vertebral body heights are maintained. Mild-to-moderate right L4-5 disc space narrowing is similar to prior.  No acute fracture is seen. Paraspinal and other soft tissues: Moderate atherosclerotic calcifications within the abdominal aorta. No hydronephrosis within either kidney. Mild left renal pelvis vascular calcification. Disc levels: T12-L1: No posterior disc bulge, central canal narrowing, or neuroforaminal stenosis. L1-2: Mild left intraforaminal disc extension. No nerve impingement is seen. No central canal or neuroforaminal stenosis. L2-3: Mild bilateral facet joint hypertrophy. Mild left-greater-than-right intraforaminal disc extension. Borderline mild left neuroforaminal stenosis, similar to prior. The right neural foramen is patent. No central canal stenosis. L3-4: Moderate bilateral facet joint hypertrophy. Mild circumferential disc bulge. Mild bilateral intraforaminal disc extension. No significant neuroforaminal stenosis.  No central canal stenosis. L4-5: Mild-to-moderate bilateral facet joint hypertrophy. Within the limitations of this modality, there again appears to be a broad-based posterior disc bulge with superimposed right lateral recess posterior disc protrusion. Severe right lateral recess stenosis again likely impinging on the descending right L5 nerve. Overall mild right-greater-than-left central canal stenosis is similar to prior. Bilateral intraforaminal disc and endplate spurring with moderate bilateral neuroforaminal stenosis, similar to prior. L5-S1: Mild-to-moderate bilateral facet joint hypertrophy. Within the limitations of this modality, there appears to be a stable to slightly increased size of a midline posterior disc protrusion that measures up to approximately 5 mm in AP dimension compared to 3-4 mm previously. This disc may minimally contact the bilateral descending S1 nerves within the lateral recesses (axial series 13, image 103). No central canal stenosis. No neuroforaminal stenosis. IMPRESSION: 1. No acute fracture is seen. 2. Within the limitations of this modality, there again  appears to be a broad-based posterior disc bulge with superimposed right lateral recess posterior disc protrusion at L4-5. Severe right lateral recess stenosis again likely impinging on the descending right L5 nerve. 3. At L5-S1, and midline posterior disc protrusion is unchanged to slightly increased in size. This disc may minimally contact the bilateral descending S1 nerves within the lateral recesses. 4. Borderline mild left neuroforaminal stenosis at L2-3. Aortic Atherosclerosis (ICD10-I70.0). Electronically Signed   By: Neita Garnet M.D.   On: 11/11/2022 14:40   DG Knee Complete 4 Views Right  Result Date: 11/11/2022 CLINICAL DATA:  Larey Seat last night.  Now having right knee pain. EXAM: RIGHT KNEE - COMPLETE 4+ VIEW COMPARISON:  None Available. FINDINGS: Minimal chronic enthesopathic changes at the quadriceps tendon insertion. No joint effusion. Joint spaces are preserved. No acute fracture dislocation. High-grade atherosclerotic calcifications. Surgical clips are seen throughout the medial thigh and medial and lateral calf. IMPRESSION: 1. No acute fracture or dislocation. 2. High-grade atherosclerotic calcifications. Electronically Signed   By: Neita Garnet M.D.   On: 11/11/2022 11:30    Microbiology: Results for orders placed or performed during the hospital encounter of 11/15/22  Urine Culture     Status: Abnormal   Collection Time: 11/15/22  5:00 PM   Specimen: Urine, Random  Result Value Ref Range Status   Specimen Description   Final    URINE, RANDOM Performed at [facility], [address] [place], [place] [address]    Special Requests   Final    NONE Reflexed from [id number] Performed at [facility], [address], [place], [place] [address]    Culture >=100,000 COLONIES/mL ESCHERICHIA COLI (A)  Final   Report Status 11/18/2022 FINAL  Final   Organism ID, Bacteria ESCHERICHIA COLI (A)  Final      Susceptibility   Escherichia coli - MIC*     AMPICILLIN <=2.0 SENSITIVE Sensitive     CEFAZOLIN <=4.0 SENSITIVE Sensitive     CEFEPIME <=0.12 SENSITIVE Sensitive     CEFTRIAXONE <=0.25 SENSITIVE Sensitive     CIPROFLOXACIN <=0.25 SENSITIVE Sensitive     GENTAMICIN <=1.0 SENSITIVE Sensitive     IMIPENEM <=0.25 SENSITIVE Sensitive     NITROFURANTOIN <=16.0 SENSITIVE Sensitive     TRIMETH/SULFA <=20.0 SENSITIVE Sensitive     AMPICILLIN/SULBACTAM <=2.0 SENSITIVE Sensitive     PIP/TAZO <=4.0 SENSITIVE Sensitive     * >=100,000 COLONIES/mL ESCHERICHIA COLI    Labs: CBC: Recent Labs  Lab 11/15/22 1321 11/16/22 0424 11/17/22 0734  WBC 14.4* 10.3 10.0  NEUTROABS 11.8*  --   --  HGB 12.8 11.6* 12.2  HCT 40.8 37.2 38.0  MCV 95.1 95.9 94.8  PLT 280 261 261   Basic Metabolic Panel: Recent Labs  Lab 11/15/22 1321 11/16/22 0424 11/17/22 0734 11/18/22 0922  NA 142 141 139 141  K 4.6 3.9 4.4 4.3  CL 109 112* 109 111  CO2 23 21* 23 22  GLUCOSE 172* 102* 163* 249*  BUN 76* 57* 37* 27*  CREATININE 2.79* 1.91* 1.43* 1.38*  CALCIUM 9.0 8.6* 8.6* 8.8*   Liver Function Tests: Recent Labs  Lab 11/16/22 0424  AST 11*  ALT 12  ALKPHOS 77  BILITOT 0.7  PROT 6.1*  ALBUMIN 3.1*   CBG: Recent Labs  Lab 11/19/22 0747 11/19/22 1145 11/19/22 1614 11/19/22 2152 11/20/22 0846  GLUCAP 143* 224* 113* 135* 184*    Discharge time spent: greater than 30 minutes.  Signed: Brendia Sacks, MD [facility] 11/20/2022

## 2022-11-20 NOTE — Plan of Care (Signed)
  Problem: Education: Goal: Knowledge of General Education information will improve Description Including pain rating scale, medication(s)/side effects and non-pharmacologic comfort measures Outcome: Progressing   

## 2022-11-24 ENCOUNTER — Ambulatory Visit: Payer: 59 | Admitting: Podiatry

## 2022-12-26 IMAGING — MR MR LUMBAR SPINE WO/W CM
4 of 7 series · 27 of 48 positions shown · IV contrast (Gadavist)
Comparison: Thoracic spine MRI today reported separately.

CLINICAL DATA: 64-year-old female with fever. Back pain radiating
to the left leg.

EXAM:
MRI LUMBAR SPINE WITHOUT AND WITH CONTRAST
TECHNIQUE: Multiplanar and multiecho pulse sequences of the lumbar spine were
obtained without and with intravenous contrast.
CONTRAST:  10mL GADAVIST GADOBUTROL 1 MMOL/ML IV SOLN in conjunction
with contrast enhanced imaging of the thoracic spine reported
separately.

[Series 1: T2 · sagittal · 4.0mm · 0.73mm/px · 6 of 16 slices shown (1 of 2)]
[im 1/16]
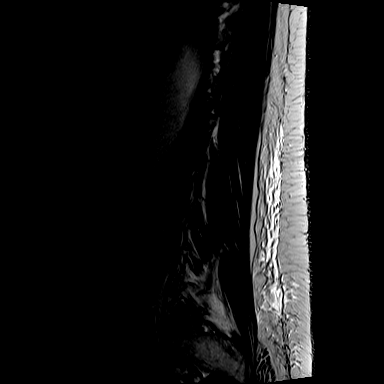
[im 4/16]
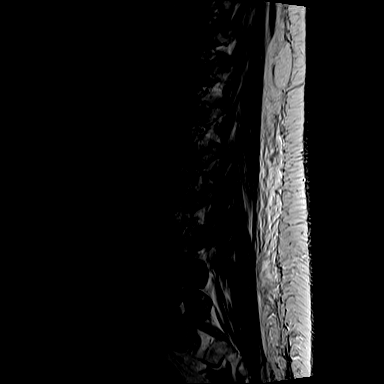
[im 7/16]
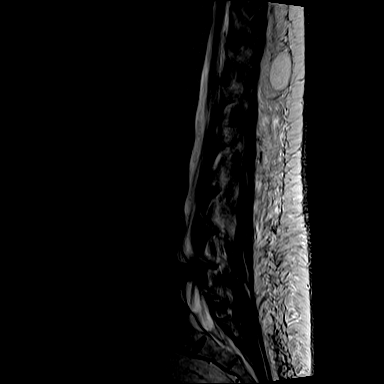
[im 10/16]
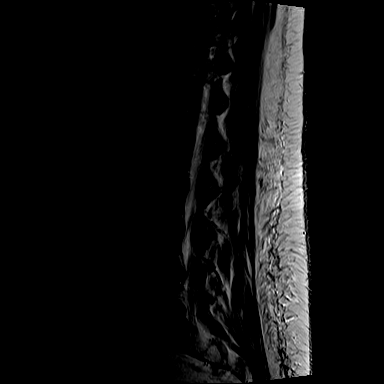
[im 13/16]
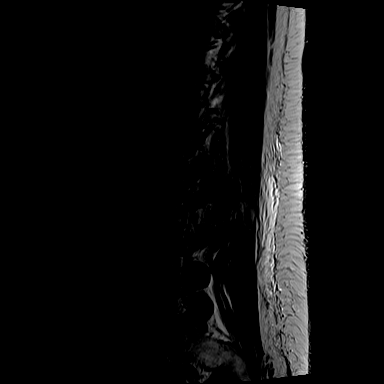
[im 16/16]
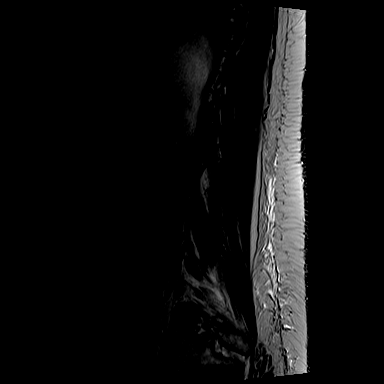

[Series 3: T1 · sagittal · 4.0mm · 0.88mm/px · 5 of 16 slices shown (1 of 2)]
[im 1/16]
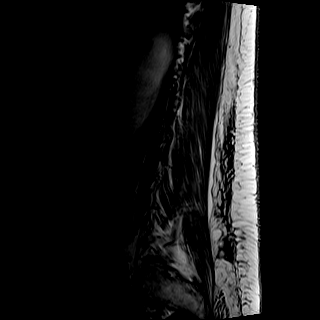
[im 4/16]
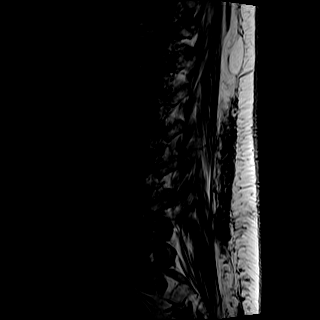
[im 8/16]
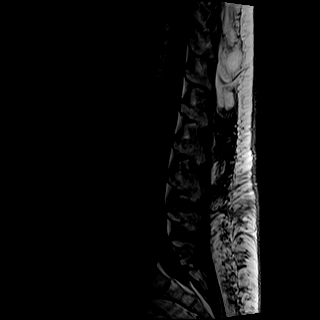
[im 12/16]
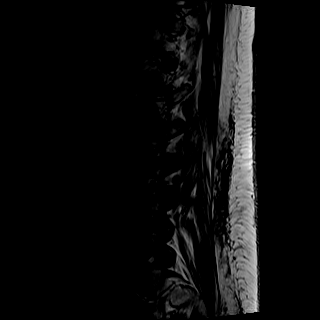
[im 16/16]
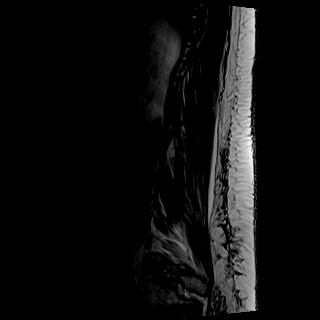

[Series 4: T2 · axial · 5.0mm · 0.57mm/px · z∈[-448,-217]mm · 9 of 31 slices shown (2 of 2)]
[im 1/31]
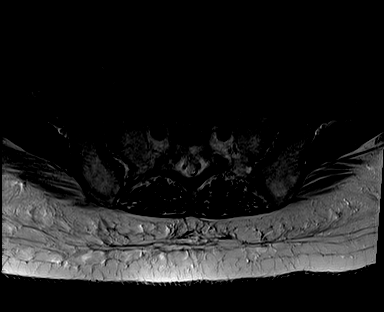
[im 4/31]
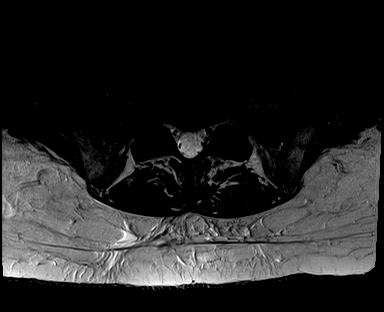
[im 8/31]
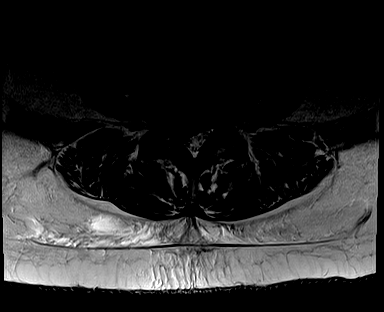
[im 12/31]
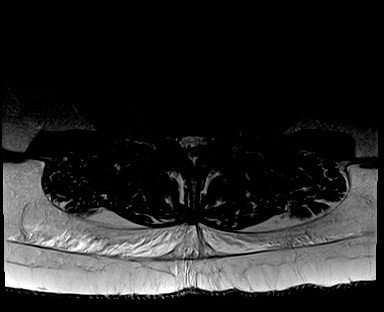
[im 16/31]
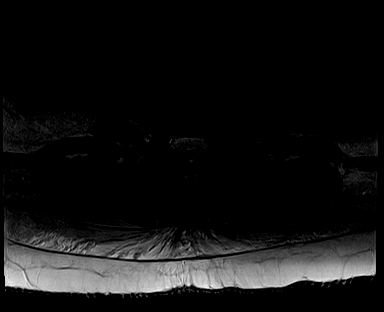
[im 19/31]
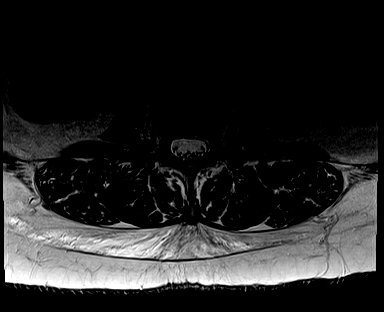
[im 23/31]
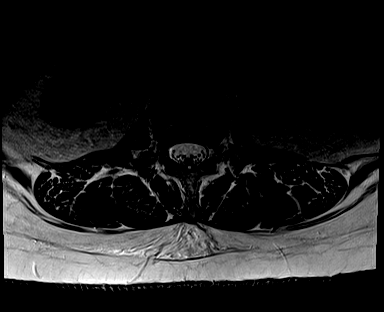
[im 27/31]
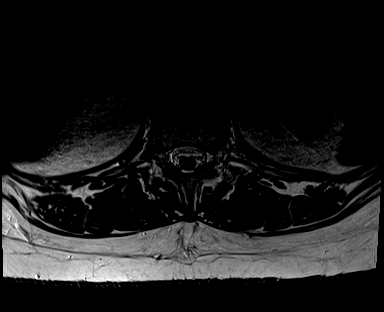
[im 31/31]
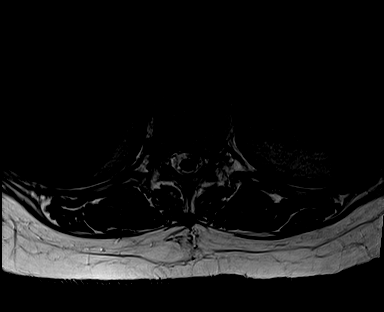

[Series 5: T1 · axial · 5.0mm · 0.34mm/px · z∈[-447,-247]mm · 7 of 31 slices shown (2 of 2)]
[im 1/31]
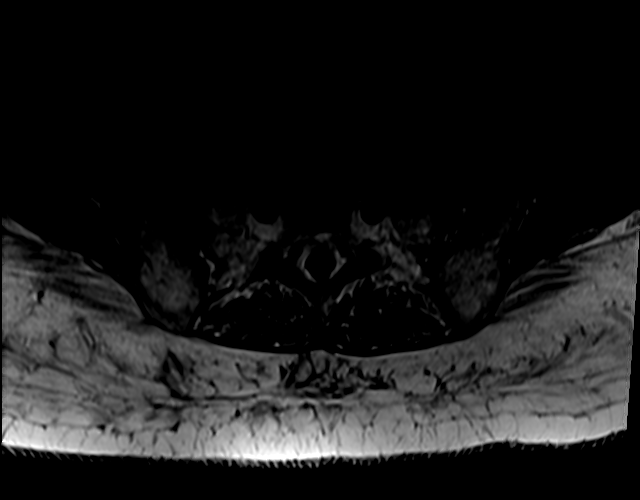
[im 4/31]
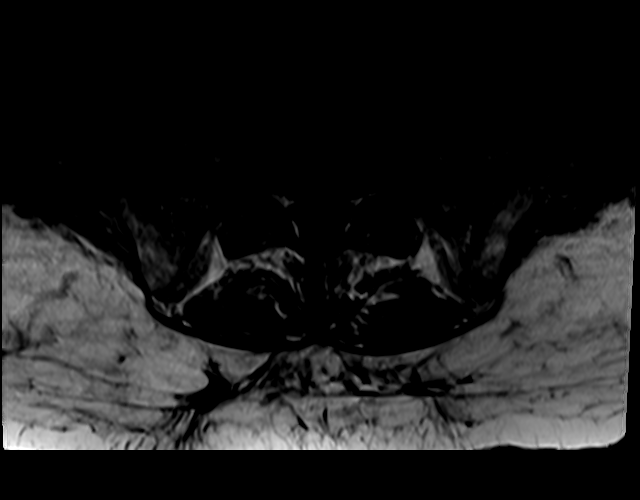
[im 8/31]
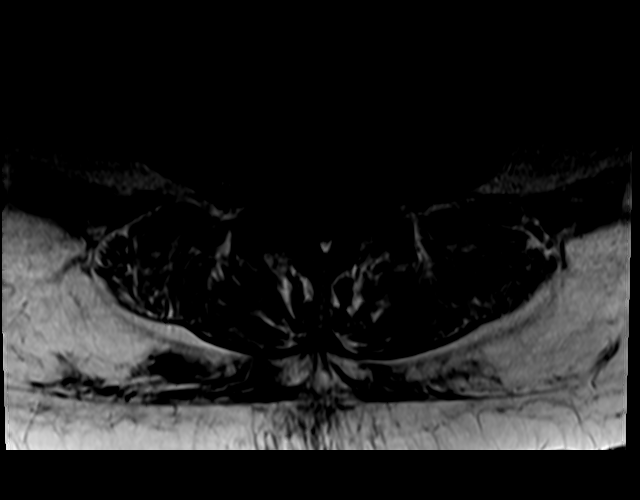
[im 12/31]
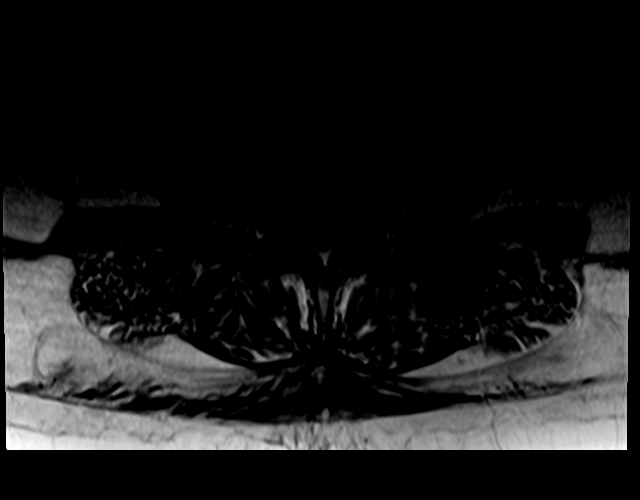
[im 16/31]
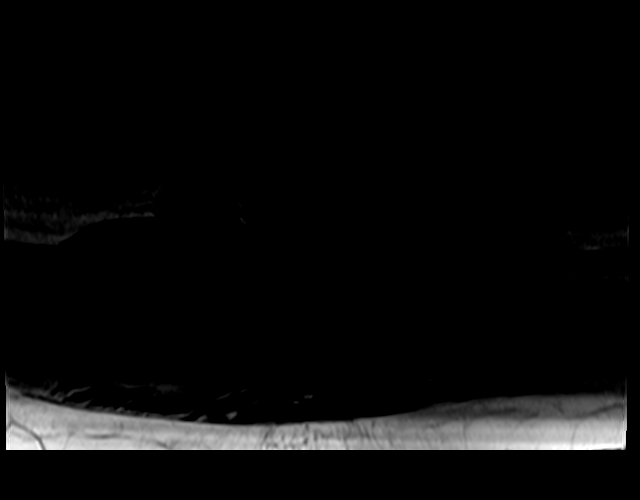
[im 19/31]
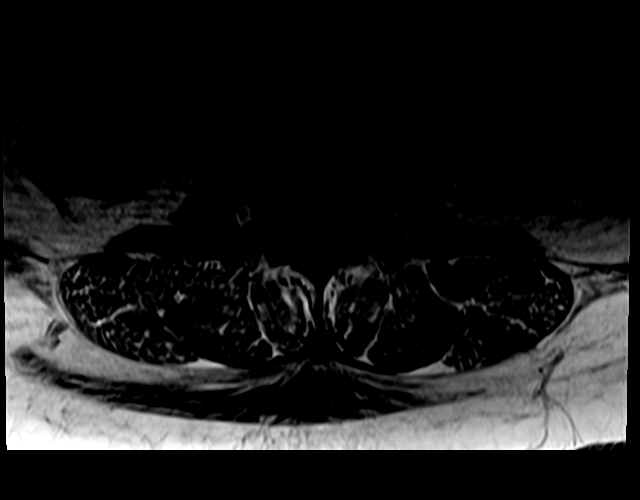
[im 27/31]
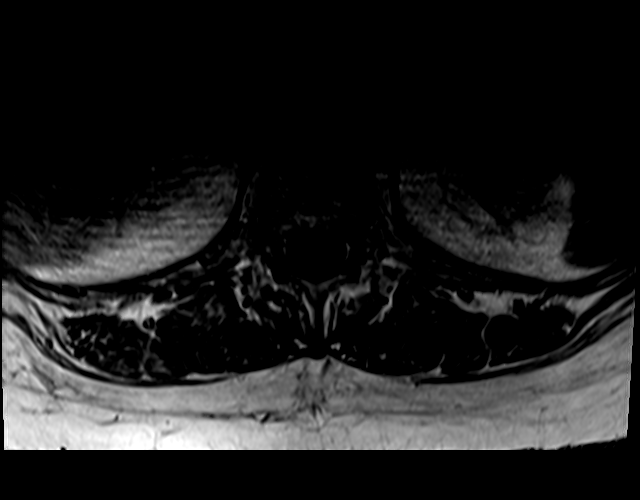

[27 of 48 positions shown; findings below may reference images not displayed]

FINDINGS: Segmentation: Normal, concordant with the thoracic spine numbering
today.

Alignment: Mild straightening of lumbar lordosis. No
spondylolisthesis.

Vertebrae: No marrow edema or evidence of acute osseous abnormality.
Visualized bone marrow signal is within normal limits. Intact
visible sacrum and SI joints.

Conus medullaris and cauda equina: Conus extends to the L1 level. No
lower spinal cord or conus signal abnormality. Proximal cauda equina
nerve roots are normal. No abnormal intradural enhancement. No dural
thickening identified.

Paraspinal and other soft tissues: Negative.

Disc levels:

T12-L1:  Negative.

L1-L2:  Negative.

L2-L3: Mild disc desiccation and circumferential disc bulge. Mild
facet hypertrophy. Mild left L2 foraminal stenosis.

L3-L4: Mild disc desiccation and circumferential disc bulge. Mild
posterior element hypertrophy. Borderline to mild bilateral L3
neural foraminal stenosis.

L4-L5: Disc desiccation, disc space loss and vacuum disc.
Circumferential disc bulge with superimposed moderate sized disc
extrusion into the right lateral recess (series 1, image 7 and
series 4, image 24). Rim enhancement of the herniated disc (series
6, image 7). Superimposed mild posterior element hypertrophy. Severe
right lateral recess stenosis (descending right L5 nerve level).
Mild overall spinal stenosis. Moderate bilateral multifactorial L4
neural foraminal stenosis.

L5-S1:  Mild disc bulging and endplate spurring.  No stenosis.
IMPRESSION: 1. Moderate-sized rightward disc extrusion at L4-L5 with severe
lateral recess stenosis. Query Right L5 radiculitis. Mild overall
spinal stenosis, and moderate multifactorial bilateral L4 neural
foraminal stenosis at that level.

2. No other acute or inflammatory process. Mild for age lumbar spine
degeneration elsewhere.

## 2023-01-09 ENCOUNTER — Other Ambulatory Visit: Payer: Self-pay | Admitting: Neurological Surgery

## 2023-01-15 NOTE — Pre-Procedure Instructions (Signed)
Surgical Instructions   Your procedure is scheduled on January 21, 2023. Report to [facility] Main Entrance "A" at 6:30 A.M., then check in with the Admitting office. Any questions or running late day of surgery: call [phone]  Questions prior to your surgery date: call [phone], Monday-Friday, 8am-4pm. If you experience any cold or flu symptoms such as cough, fever, chills, shortness of breath, etc. between now and your scheduled surgery, please notify us at the above number.     Remember:  Do not eat or drink after midnight the night before your surgery    Take these medicines the morning of surgery with A SIP OF WATER: amLODipine (NORVASC)  atorvastatin (LIPITOR)  carvedilol (COREG)  docusate sodium (COLACE)  ezetimibe (ZETIA)  hydrALAZINE (APRESOLINE)    May take these medicines IF NEEDED: oxyCODONE-acetaminophen (PERCOCET)    One week prior to surgery, STOP taking any Aspirin (unless otherwise instructed by your surgeon) Aleve, Naproxen, Ibuprofen, Motrin, Advil, Goody's, BC's, all herbal medications, fish oil, and non-prescription vitamins.   WHAT DO I DO ABOUT MY DIABETES MEDICATION?   THE NIGHT BEFORE SURGERY, take 22 units of insulin lispro protamine-lispro (HUMALOG 75/25 MIX).       THE MORNING OF SURGERY, DO NOT take any of your insulin lispro protamine-lispro (HUMALOG 75/25 MIX).   HOW TO MANAGE YOUR DIABETES BEFORE AND AFTER SURGERY  Why is it important to control my blood sugar before and after surgery? Improving blood sugar levels before and after surgery helps healing and can limit problems. A way of improving blood sugar control is eating a healthy diet by:  Eating less sugar and carbohydrates  Increasing activity/exercise  Talking with your doctor about reaching your blood sugar goals High blood sugars (greater than 180 mg/dL) can raise your risk of infections and slow your recovery, so you will need to focus on controlling your diabetes  during the weeks before surgery. Make sure that the doctor who takes care of your diabetes knows about your planned surgery including the date and location.  How do I manage my blood sugar before surgery? Check your blood sugar at least 4 times a day, starting 2 days before surgery, to make sure that the level is not too high or low.  Check your blood sugar the morning of your surgery when you wake up and every 2 hours until you get to the Short Stay unit.  If your blood sugar is less than 70 mg/dL, you will need to treat for low blood sugar: Do not take insulin. Treat a low blood sugar (less than 70 mg/dL) with  cup of clear juice (cranberry or apple), 4 glucose tablets, OR glucose gel. Recheck blood sugar in 15 minutes after treatment (to make sure it is greater than 70 mg/dL). If your blood sugar is not greater than 70 mg/dL on recheck, call [phone] for further instructions. Report your blood sugar to the short stay nurse when you get to Short Stay.  If you are admitted to the hospital after surgery: Your blood sugar will be checked by the staff and you will probably be given insulin after surgery (instead of oral diabetes medicines) to make sure you have good blood sugar levels. The goal for blood sugar control after surgery is 80-180 mg/dL.\                     Do NOT Smoke (Tobacco/Vaping) for 24 hours prior to your procedure.  If you use a CPAP at night,  you may bring your mask/headgear for your overnight stay.   You will be asked to remove any contacts, glasses, piercing's, hearing aid's, dentures/partials prior to surgery. Please bring cases for these items if needed.    Patients discharged the day of surgery will not be allowed to drive home, and someone needs to stay with them for 24 hours.  SURGICAL WAITING ROOM VISITATION Patients may have no more than 2 support people in the waiting area - these visitors may rotate.   Pre-op nurse will coordinate an appropriate time  for 1 ADULT support person, who may not rotate, to accompany patient in pre-op.  Children under the age of 68 must have an adult with them who is not the patient and must remain in the main waiting area with an adult.  If the patient needs to stay at the hospital during part of their recovery, the visitor guidelines for inpatient rooms apply.  Please refer to the [facility] website for the visitor guidelines for any additional information.   If you received a COVID test during your pre-op visit  it is requested that you wear a mask when out in public, stay away from anyone that may not be feeling well and notify your surgeon if you develop symptoms. If you have been in contact with anyone that has tested positive in the last 10 days please notify you surgeon.      Pre-operative 5 CHG Bathing Instructions   You can play a key role in reducing the risk of infection after surgery. Your skin needs to be as free of germs as possible. You can reduce the number of germs on your skin by washing with CHG (chlorhexidine gluconate) soap before surgery. CHG is an antiseptic soap that kills germs and continues to kill germs even after washing.   DO NOT use if you have an allergy to chlorhexidine/CHG or antibacterial soaps. If your skin becomes reddened or irritated, stop using the CHG and notify one of our RNs at [phone].   Please shower with the CHG soap starting 4 days before surgery using the following schedule:     Please keep in mind the following:  DO NOT shave, including legs and underarms, starting the day of your first shower.   You may shave your face at any point before/day of surgery.  Place clean sheets on your bed the day you start using CHG soap. Use a clean washcloth (not used since being washed) for each shower. DO NOT sleep with pets once you start using the CHG.   CHG Shower Instructions:  Wash your face and private area with normal soap. If you choose to wash your hair,  wash first with your normal shampoo.  After you use shampoo/soap, rinse your hair and body thoroughly to remove shampoo/soap residue.  Turn the water OFF and apply about 3 tablespoons (45 ml) of CHG soap to a CLEAN washcloth.  Apply CHG soap ONLY FROM YOUR NECK DOWN TO YOUR TOES (washing for 3-5 minutes)  DO NOT use CHG soap on face, private areas, open wounds, or sores.  Pay special attention to the area where your surgery is being performed.  If you are having back surgery, having someone wash your back for you may be helpful. Wait 2 minutes after CHG soap is applied, then you may rinse off the CHG soap.  Pat dry with a clean towel  Put on clean clothes/pajamas   If you choose to wear lotion, please use ONLY the  CHG-compatible lotions on the back of this paper.   Additional instructions for the day of surgery: DO NOT APPLY any lotions, deodorants, cologne, or perfumes.   Do not bring valuables to the hospital. [facility] is not responsible for any belongings/valuables. Do not wear nail polish, gel polish, artificial nails, or any other type of covering on natural nails (fingers and toes) Do not wear jewelry or makeup Put on clean/comfortable clothes.  Please brush your teeth.  Ask your nurse before applying any prescription medications to the skin.     CHG Compatible Lotions   Aveeno Moisturizing lotion  Cetaphil Moisturizing Cream  Cetaphil Moisturizing Lotion  Clairol Herbal Essence Moisturizing Lotion, Dry Skin  Clairol Herbal Essence Moisturizing Lotion, Extra Dry Skin  Clairol Herbal Essence Moisturizing Lotion, Normal Skin  Curel Age Defying Therapeutic Moisturizing Lotion with Alpha Hydroxy  Curel Extreme Care Body Lotion  Curel Soothing Hands Moisturizing Hand Lotion  Curel Therapeutic Moisturizing Cream, Fragrance-Free  Curel Therapeutic Moisturizing Lotion, Fragrance-Free  Curel Therapeutic Moisturizing Lotion, Original Formula  Eucerin Daily Replenishing Lotion   Eucerin Dry Skin Therapy Plus Alpha Hydroxy Crme  Eucerin Dry Skin Therapy Plus Alpha Hydroxy Lotion  Eucerin Original Crme  Eucerin Original Lotion  Eucerin Plus Crme Eucerin Plus Lotion  Eucerin TriLipid Replenishing Lotion  Keri Anti-Bacterial Hand Lotion  Keri Deep Conditioning Original Lotion Dry Skin Formula Softly Scented  Keri Deep Conditioning Original Lotion, Fragrance Free Sensitive Skin Formula  Keri Lotion Fast Absorbing Fragrance Free Sensitive Skin Formula  Keri Lotion Fast Absorbing Softly Scented Dry Skin Formula  Keri Original Lotion  Keri Skin Renewal Lotion Keri Silky Smooth Lotion  Keri Silky Smooth Sensitive Skin Lotion  Nivea Body Creamy Conditioning Oil  Nivea Body Extra Enriched Lotion  Nivea Body Original Lotion  Nivea Body Sheer Moisturizing Lotion Nivea Crme  Nivea Skin Firming Lotion  NutraDerm 30 Skin Lotion  NutraDerm Skin Lotion  NutraDerm Therapeutic Skin Cream  NutraDerm Therapeutic Skin Lotion  ProShield Protective Hand Cream  Provon moisturizing lotion  Please read over the following fact sheets that you were given.

## 2023-01-16 ENCOUNTER — Encounter (HOSPITAL_COMMUNITY)
Admission: RE | Admit: 2023-01-16 | Discharge: 2023-01-16 | Disposition: A | Discharge status: Home / Self Care | Payer: 59 | Source: Ambulatory Visit | Attending: Neurological Surgery | Admitting: Neurological Surgery

## 2023-01-16 ENCOUNTER — Other Ambulatory Visit: Payer: Self-pay

## 2023-01-16 ENCOUNTER — Encounter (HOSPITAL_COMMUNITY): Payer: Self-pay

## 2023-01-16 VITALS — BP 126/50 | HR 72 | Resp 17 | Ht 64.0 in | Wt 210.4 lb

## 2023-01-16 DIAGNOSIS — E119 Type 2 diabetes mellitus without complications: Secondary | ICD-10-CM | POA: Insufficient documentation

## 2023-01-16 DIAGNOSIS — Z01818 Encounter for other preprocedural examination: Secondary | ICD-10-CM | POA: Diagnosis present

## 2023-01-16 DIAGNOSIS — Z794 Long term (current) use of insulin: Secondary | ICD-10-CM | POA: Insufficient documentation

## 2023-01-16 HISTORY — DX: Emphysema, unspecified: J43.9

## 2023-01-16 LAB — BASIC METABOLIC PANEL
Anion gap: 13 (ref 5–15)
BUN: 12 mg/dL (ref 8–23)
CO2: 25 mmol/L (ref 22–32)
Calcium: 9.1 mg/dL (ref 8.9–10.3)
Chloride: 106 mmol/L (ref 98–111)
Creatinine, Ser: 1.11 mg/dL — ABNORMAL HIGH (ref 0.44–1.00)
GFR, Estimated: 55 mL/min — ABNORMAL LOW (ref >60)
Glucose, Bld: 122 mg/dL — ABNORMAL HIGH (ref 70–99)
Potassium: 3.6 mmol/L (ref 3.5–5.1)
Sodium: 144 mmol/L (ref 135–145)

## 2023-01-16 LAB — GLUCOSE, CAPILLARY: Glucose-Capillary: 114 mg/dL — ABNORMAL HIGH (ref 70–99)

## 2023-01-16 LAB — CBC
HCT: 37.6 % (ref 36.0–46.0)
Hemoglobin: 12 g/dL (ref 12.0–15.0)
MCH: 30.8 pg (ref 26.0–34.0)
MCHC: 31.9 g/dL (ref 30.0–36.0)
MCV: 96.4 fL (ref 80.0–100.0)
Platelets: 339 10*3/uL (ref 150–400)
RBC: 3.9 MIL/uL (ref 3.87–5.11)
RDW: 14.8 % (ref 11.5–15.5)
WBC: 12.6 10*3/uL — ABNORMAL HIGH (ref 4.0–10.5)
nRBC: 0 % (ref 0.0–0.2)

## 2023-01-16 LAB — PROTIME-INR
INR: 0.9 (ref 0.8–1.2)
Prothrombin Time: 12.3 s (ref 11.4–15.2)

## 2023-01-16 LAB — HEMOGLOBIN A1C
Hgb A1c MFr Bld: 7 % — ABNORMAL HIGH (ref 4.8–5.6)
Mean Plasma Glucose: 154.2 mg/dL

## 2023-01-16 LAB — SURGICAL PCR SCREEN
MRSA, PCR: NEGATIVE
Staphylococcus aureus: NEGATIVE

## 2023-01-16 NOTE — Progress Notes (Signed)
PCP - Alyssa Allwardt, PA-C Cardiologist - Denies  PPM/ICD - Denies Device Orders - n/a Rep Notified - n/a  Chest x-ray - Denies EKG - 01/16/2023 Stress Test - Denies ECHO - Denies  Cardiac Cath - Denies  Sleep Study - Denies CPAP - n/a  Pt is DM2. She checks her blood sugar 2x/day. Normal fasting range is in 110s. CBG at pre-op appointment 114. A1c result pending.  Last dose of GLP1 agonist- n/a GLP1 instructions: n/a  Blood Thinner Instructions: n/a Aspirin Instructions: n/a  NPO after midnight  COVID TEST- n/a. Pt tested positive for COVID at [facility] 11/15/22   Anesthesia review: Yes. Recent admission to [facility] 11/15/22 with AKI. Pt BP at pre-op 135/44 and 126/50. She had not taken any of her BP meds yet, but states that she usually does take them regularly. She has a BP cuff at home, but does not use it. Discussed with Shonna Chock, PA-C. Pt instructed to start checking her BP at home a few times a day and if her blood pressure is remaining around 120-130s/40s-50s then she should reach out to her PCP to discuss possible medication changes. Pt understood instructions.  Patient denies shortness of breath, fever, cough and chest pain at PAT appointment. Pt denies any respiratory illness/infection in the last two months.    All instructions explained to the patient, with a verbal understanding of the material. Patient agrees to go over the instructions while at home for a better understanding. Patient also instructed to self quarantine after being tested for COVID-19. The opportunity to ask questions was provided.

## 2023-01-19 NOTE — Progress Notes (Signed)
Anesthesia Chart Review:  67 year old female with pertinent history including CKD 3, HTN, PAD s/p left BKA 08/2020, HLD, former smoker with associated COPD/emphysema, IDDM 2 (A1c 7.0 on 01/16/2023), diastolic dysfunction.  Echo 11/16/2022 during admission for AKI on CKD showed EF 55 to 60%, grade 1 DD, normal RV function, no significant valvular abnormalities.  Preop labs reviewed, unremarkable.  EKG 01/16/2023: NSR.  Rate 72.  TTE 11/16/2022:  1. Left ventricular ejection fraction, by estimation, is 55 to 60%. The  left ventricle has normal function. The left ventricle has no regional  wall motion abnormalities. There is mild concentric left ventricular  hypertrophy. Left ventricular diastolic  parameters are consistent with Grade I diastolic dysfunction (impaired  relaxation).   2. Right ventricular systolic function is normal. The right ventricular  size is not well visualized.   3. The mitral valve is normal in structure. Trivial mitral valve  regurgitation. No evidence of mitral stenosis.   4. The aortic valve was not well visualized. Aortic valve regurgitation  is not visualized. No aortic stenosis is present.   5. The inferior vena cava is normal in size with greater than 50%  respiratory variability, suggesting right atrial pressure of 3 mmHg.     Shirley Chen [facility] Short Stay Center/Anesthesiology Phone [phone] 01/19/2023 2:08 PM

## 2023-01-19 NOTE — Anesthesia Preprocedure Evaluation (Signed)
Anesthesia Evaluation  Patient identified by MRN, date of birth, ID band Patient awake    Reviewed: Allergy & Precautions, NPO status , Patient's Chart, lab work & pertinent test results, reviewed documented beta blocker date and time   History of Anesthesia Complications Negative for: history of anesthetic complications  Airway Mallampati: II  TM Distance: >3 FB Neck ROM: Full    Dental  (+) Edentulous Lower, Edentulous Upper   Pulmonary COPD, Patient abstained from smoking., former smoker   Pulmonary exam normal        Cardiovascular hypertension, Pt. on medications and Pt. on home beta blockers + CAD and + Peripheral Vascular Disease (s/p left BKA 2022)  Normal cardiovascular exam  Echo 11/16/2022: EF 55-60%, grade 1 DD, no significant valvular abnormalities   Neuro/Psych    GI/Hepatic negative GI ROS, Neg liver ROS,,,  Endo/Other  diabetes, Type 2, Insulin Dependent    Renal/GU Renal InsufficiencyRenal disease  negative genitourinary   Musculoskeletal  (+) Arthritis ,    Abdominal   Peds  Hematology  (+) Blood dyscrasia, anemia   Anesthesia Other Findings Day of surgery medications reviewed with patient.  Reproductive/Obstetrics negative OB ROS                              Anesthesia Physical Anesthesia Plan  ASA: 2  Anesthesia Plan: General   Post-op Pain Management: Tylenol PO (pre-op)* and Ketamine IV*   Induction: Intravenous  PONV Risk Score and Plan: 3 and Ondansetron, Dexamethasone, Treatment may vary due to age or medical condition and Midazolam  Airway Management Planned: Oral ETT  Additional Equipment: None  Intra-op Plan:   Post-operative Plan: Extubation in OR  Informed Consent: I have reviewed the patients History and Physical, chart, labs and discussed the procedure including the risks, benefits and alternatives for the proposed anesthesia with the patient  or authorized representative who has indicated his/her understanding and acceptance.     Dental advisory given  Plan Discussed with: CRNA  Anesthesia Plan Comments: (PAT note by Antionette Poles, PA-C: 67 year old female with pertinent history including CKD 3, HTN, PAD s/p left BKA 08/2020, HLD, former smoker with associated COPD/emphysema, IDDM 2 (A1c 7.0 on 01/16/2023), diastolic dysfunction.  Echo 11/16/2022 during admission for AKI on CKD showed EF 55 to 60%, grade 1 DD, normal RV function, no significant valvular abnormalities.  Preop labs reviewed, unremarkable.  EKG 01/16/2023: NSR.  Rate 72.  TTE 11/16/2022:  1. Left ventricular ejection fraction, by estimation, is 55 to 60%. The  left ventricle has normal function. The left ventricle has no regional  wall motion abnormalities. There is mild concentric left ventricular  hypertrophy. Left ventricular diastolic  parameters are consistent with Grade I diastolic dysfunction (impaired  relaxation).   2. Right ventricular systolic function is normal. The right ventricular  size is not well visualized.   3. The mitral valve is normal in structure. Trivial mitral valve  regurgitation. No evidence of mitral stenosis.   4. The aortic valve was not well visualized. Aortic valve regurgitation  is not visualized. No aortic stenosis is present.   5. The inferior vena cava is normal in size with greater than 50%  respiratory variability, suggesting right atrial pressure of 3 mmHg.    )         Anesthesia Quick Evaluation

## 2023-01-21 ENCOUNTER — Observation Stay (HOSPITAL_COMMUNITY)
Admission: RE | Admit: 2023-01-21 | Discharge: 2023-01-22 | Disposition: A | Discharge status: Home / Self Care | Payer: 59 | Attending: Neurological Surgery | Admitting: Neurological Surgery

## 2023-01-21 ENCOUNTER — Encounter (HOSPITAL_COMMUNITY)
Admission: RE | Disposition: A | Discharge status: Home / Self Care | Payer: Self-pay | Source: Home / Self Care | Attending: Neurological Surgery

## 2023-01-21 ENCOUNTER — Encounter (HOSPITAL_COMMUNITY): Payer: Self-pay | Admitting: Neurological Surgery

## 2023-01-21 ENCOUNTER — Ambulatory Visit (HOSPITAL_BASED_OUTPATIENT_CLINIC_OR_DEPARTMENT_OTHER): Payer: 59 | Admitting: Certified Registered"

## 2023-01-21 ENCOUNTER — Ambulatory Visit (HOSPITAL_COMMUNITY): Payer: 59

## 2023-01-21 ENCOUNTER — Ambulatory Visit (HOSPITAL_COMMUNITY): Payer: 59 | Admitting: Vascular Surgery

## 2023-01-21 ENCOUNTER — Other Ambulatory Visit: Payer: Self-pay

## 2023-01-21 DIAGNOSIS — Z79899 Other long term (current) drug therapy: Secondary | ICD-10-CM | POA: Insufficient documentation

## 2023-01-21 DIAGNOSIS — Z87891 Personal history of nicotine dependence: Secondary | ICD-10-CM | POA: Insufficient documentation

## 2023-01-21 DIAGNOSIS — Z794 Long term (current) use of insulin: Secondary | ICD-10-CM | POA: Insufficient documentation

## 2023-01-21 DIAGNOSIS — M5416 Radiculopathy, lumbar region: Secondary | ICD-10-CM | POA: Diagnosis not present

## 2023-01-21 DIAGNOSIS — M48061 Spinal stenosis, lumbar region without neurogenic claudication: Principal | ICD-10-CM | POA: Insufficient documentation

## 2023-01-21 DIAGNOSIS — M5116 Intervertebral disc disorders with radiculopathy, lumbar region: Secondary | ICD-10-CM | POA: Diagnosis not present

## 2023-01-21 DIAGNOSIS — I10 Essential (primary) hypertension: Secondary | ICD-10-CM | POA: Diagnosis not present

## 2023-01-21 DIAGNOSIS — E119 Type 2 diabetes mellitus without complications: Secondary | ICD-10-CM | POA: Insufficient documentation

## 2023-01-21 DIAGNOSIS — Z9889 Other specified postprocedural states: Principal | ICD-10-CM

## 2023-01-21 HISTORY — PX: LUMBAR LAMINECTOMY/DECOMPRESSION MICRODISCECTOMY: SHX5026

## 2023-01-21 LAB — GLUCOSE, CAPILLARY
Glucose-Capillary: 105 mg/dL — ABNORMAL HIGH (ref 70–99)
Glucose-Capillary: 105 mg/dL — ABNORMAL HIGH (ref 70–99)
Glucose-Capillary: 352 mg/dL — ABNORMAL HIGH (ref 70–99)
Glucose-Capillary: 298 mg/dL — ABNORMAL HIGH (ref 70–99)

## 2023-01-21 SURGERY — LUMBAR LAMINECTOMY/DECOMPRESSION MICRODISCECTOMY 1 LEVEL
Anesthesia: General | Site: Back | Laterality: Right

## 2023-01-21 MED ORDER — SENNOSIDES-DOCUSATE SODIUM 8.6-50 MG PO TABS
1.0000 | ORAL_TABLET | Freq: Two times a day (BID) | ORAL | Status: DC
  Administered 2023-01-21 – 2023-01-22 (×2): 1 via ORAL
  Filled 2023-01-21 (×2): qty 1

## 2023-01-21 MED ORDER — HYDRALAZINE HCL 50 MG PO TABS
50.0000 mg | ORAL_TABLET | Freq: Three times a day (TID) | ORAL | Status: DC
  Administered 2023-01-21 – 2023-01-22 (×2): 50 mg via ORAL
  Filled 2023-01-21 (×3): qty 1

## 2023-01-21 MED ORDER — DROPERIDOL 2.5 MG/ML IJ SOLN: 0.6250 mg | Freq: Once | INTRAMUSCULAR | Status: DC | PRN

## 2023-01-21 MED ORDER — LACTATED RINGERS IV SOLN: INTRAVENOUS | Status: DC

## 2023-01-21 MED ORDER — FENTANYL CITRATE (PF) 250 MCG/5ML IJ SOLN
INTRAMUSCULAR | Status: DC | PRN
  Administered 2023-01-21: 100 ug via INTRAVENOUS

## 2023-01-21 MED ORDER — LIDOCAINE 2% (20 MG/ML) 5 ML SYRINGE
INTRAMUSCULAR | Status: AC
  Filled 2023-01-21: qty 5

## 2023-01-21 MED ORDER — FERROUS SULFATE 325 (65 FE) MG PO TABS
325.0000 mg | ORAL_TABLET | Freq: Every day | ORAL | Status: DC
  Administered 2023-01-22: 325 mg via ORAL
  Filled 2023-01-21: qty 1

## 2023-01-21 MED ORDER — EZETIMIBE 10 MG PO TABS
10.0000 mg | ORAL_TABLET | Freq: Every day | ORAL | Status: DC
  Administered 2023-01-22: 10 mg via ORAL
  Filled 2023-01-21: qty 1

## 2023-01-21 MED ORDER — PROPOFOL 10 MG/ML IV BOLUS
INTRAVENOUS | Status: AC
  Filled 2023-01-21: qty 20

## 2023-01-21 MED ORDER — CHLORHEXIDINE GLUCONATE CLOTH 2 % EX PADS: 6.0000 | MEDICATED_PAD | Freq: Once | CUTANEOUS | Status: DC

## 2023-01-21 MED ORDER — SODIUM CHLORIDE 0.9% FLUSH: 3.0000 mL | INTRAVENOUS | Status: DC | PRN

## 2023-01-21 MED ORDER — DEXAMETHASONE SODIUM PHOSPHATE 10 MG/ML IJ SOLN
INTRAMUSCULAR | Status: DC | PRN
  Administered 2023-01-21: 10 mg via INTRAVENOUS

## 2023-01-21 MED ORDER — LIDOCAINE 2% (20 MG/ML) 5 ML SYRINGE
INTRAMUSCULAR | Status: DC | PRN
  Administered 2023-01-21: 60 mg via INTRAVENOUS

## 2023-01-21 MED ORDER — SUGAMMADEX SODIUM 200 MG/2ML IV SOLN
INTRAVENOUS | Status: DC | PRN
  Administered 2023-01-21: 200 mg via INTRAVENOUS

## 2023-01-21 MED ORDER — PROPOFOL 10 MG/ML IV BOLUS
INTRAVENOUS | Status: DC | PRN
  Administered 2023-01-21: 110 mg via INTRAVENOUS

## 2023-01-21 MED ORDER — PHENYLEPHRINE HCL (PRESSORS) 10 MG/ML IV SOLN
INTRAVENOUS | Status: DC | PRN
Start: 2023-01-21 — End: 2023-01-21
  Administered 2023-01-21 (×2): 160 ug via INTRAVENOUS
  Administered 2023-01-21: 80 ug via INTRAVENOUS

## 2023-01-21 MED ORDER — INSULIN ASPART 100 UNIT/ML IJ SOLN
0.0000 [IU] | Freq: Three times a day (TID) | INTRAMUSCULAR | Status: DC
  Administered 2023-01-22 (×2): 5 [IU] via SUBCUTANEOUS
  Administered 2023-01-22: 8 [IU] via SUBCUTANEOUS

## 2023-01-21 MED ORDER — EPHEDRINE SULFATE-NACL 50-0.9 MG/10ML-% IV SOSY
PREFILLED_SYRINGE | INTRAVENOUS | Status: DC | PRN
  Administered 2023-01-21: 10 mg via INTRAVENOUS

## 2023-01-21 MED ORDER — GABAPENTIN 300 MG PO CAPS
300.0000 mg | ORAL_CAPSULE | ORAL | Status: AC
  Administered 2023-01-21: 300 mg via ORAL
  Filled 2023-01-21: qty 1

## 2023-01-21 MED ORDER — MENTHOL 3 MG MT LOZG: 1.0000 | LOZENGE | OROMUCOSAL | Status: DC | PRN

## 2023-01-21 MED ORDER — CARVEDILOL 25 MG PO TABS
25.0000 mg | ORAL_TABLET | Freq: Two times a day (BID) | ORAL | Status: DC
  Administered 2023-01-21 – 2023-01-22 (×2): 25 mg via ORAL
  Filled 2023-01-21 (×2): qty 1

## 2023-01-21 MED ORDER — ROCURONIUM BROMIDE 10 MG/ML (PF) SYRINGE
PREFILLED_SYRINGE | INTRAVENOUS | Status: AC
  Filled 2023-01-21: qty 10

## 2023-01-21 MED ORDER — ROCURONIUM BROMIDE 10 MG/ML (PF) SYRINGE
PREFILLED_SYRINGE | INTRAVENOUS | Status: DC | PRN
  Administered 2023-01-21: 50 mg via INTRAVENOUS
  Administered 2023-01-21: 20 mg via INTRAVENOUS

## 2023-01-21 MED ORDER — ONDANSETRON HCL 4 MG/2ML IJ SOLN
INTRAMUSCULAR | Status: DC | PRN
  Administered 2023-01-21: 4 mg via INTRAVENOUS

## 2023-01-21 MED ORDER — ONDANSETRON HCL 4 MG/2ML IJ SOLN
INTRAMUSCULAR | Status: AC
  Filled 2023-01-21: qty 2

## 2023-01-21 MED ORDER — PHENOL 1.4 % MT LIQD: 1.0000 | OROMUCOSAL | Status: DC | PRN

## 2023-01-21 MED ORDER — CEFAZOLIN SODIUM-DEXTROSE 2-4 GM/100ML-% IV SOLN
2.0000 g | Freq: Three times a day (TID) | INTRAVENOUS | Status: AC
  Administered 2023-01-21 (×2): 2 g via INTRAVENOUS
  Filled 2023-01-21 (×2): qty 100

## 2023-01-21 MED ORDER — ACETAMINOPHEN 650 MG RE SUPP: 650.0000 mg | RECTAL | Status: DC | PRN

## 2023-01-21 MED ORDER — METHOCARBAMOL 500 MG PO TABS
500.0000 mg | ORAL_TABLET | Freq: Four times a day (QID) | ORAL | Status: DC | PRN
  Administered 2023-01-21 – 2023-01-22 (×6): 500 mg via ORAL
  Filled 2023-01-21 (×6): qty 1

## 2023-01-21 MED ORDER — KETAMINE HCL 10 MG/ML IJ SOLN
INTRAMUSCULAR | Status: DC | PRN
Start: 2023-01-21 — End: 2023-01-21
  Administered 2023-01-21: 30 mg via INTRAVENOUS

## 2023-01-21 MED ORDER — SENNA 8.6 MG PO TABS
1.0000 | ORAL_TABLET | Freq: Two times a day (BID) | ORAL | Status: DC
  Administered 2023-01-21 – 2023-01-22 (×2): 8.6 mg via ORAL
  Filled 2023-01-21 (×2): qty 1

## 2023-01-21 MED ORDER — INSULIN ASPART 100 UNIT/ML IJ SOLN
0.0000 [IU] | Freq: Three times a day (TID) | INTRAMUSCULAR | Status: DC
  Administered 2023-01-21: 11 [IU] via SUBCUTANEOUS

## 2023-01-21 MED ORDER — ACETAMINOPHEN 500 MG PO TABS
1000.0000 mg | ORAL_TABLET | ORAL | Status: AC
  Administered 2023-01-21: 1000 mg via ORAL

## 2023-01-21 MED ORDER — BUPIVACAINE HCL (PF) 0.25 % IJ SOLN
INTRAMUSCULAR | Status: DC | PRN
  Administered 2023-01-21: 3 mL
  Administered 2023-01-21: 7 mL

## 2023-01-21 MED ORDER — SODIUM CHLORIDE 0.9% FLUSH
3.0000 mL | Freq: Two times a day (BID) | INTRAVENOUS | Status: DC
  Administered 2023-01-21: 3 mL via INTRAVENOUS

## 2023-01-21 MED ORDER — THROMBIN 5000 UNITS EX SOLR
CUTANEOUS | Status: AC
  Filled 2023-01-21: qty 5000

## 2023-01-21 MED ORDER — INSULIN ASPART 100 UNIT/ML IJ SOLN
0.0000 [IU] | Freq: Every day | INTRAMUSCULAR | Status: DC
  Administered 2023-01-21: 3 [IU] via SUBCUTANEOUS

## 2023-01-21 MED ORDER — SORBITOL 70 % SOLN
30.0000 mL | Freq: Every day | Status: DC | PRN
  Administered 2023-01-21: 30 mL via ORAL
  Filled 2023-01-21 (×2): qty 30

## 2023-01-21 MED ORDER — ONDANSETRON HCL 4 MG PO TABS: 4.0000 mg | ORAL_TABLET | Freq: Four times a day (QID) | ORAL | Status: DC | PRN

## 2023-01-21 MED ORDER — ACETAMINOPHEN 500 MG PO TABS
1000.0000 mg | ORAL_TABLET | Freq: Once | ORAL | Status: DC
  Filled 2023-01-21: qty 2

## 2023-01-21 MED ORDER — 0.9 % SODIUM CHLORIDE (POUR BTL) OPTIME
TOPICAL | Status: DC | PRN
  Administered 2023-01-21: 1000 mL

## 2023-01-21 MED ORDER — OXYCODONE-ACETAMINOPHEN 7.5-325 MG PO TABS
1.0000 | ORAL_TABLET | Freq: Four times a day (QID) | ORAL | Status: DC | PRN
  Administered 2023-01-21 – 2023-01-22 (×6): 1 via ORAL
  Filled 2023-01-21 (×6): qty 1

## 2023-01-21 MED ORDER — CHLORHEXIDINE GLUCONATE 0.12 % MT SOLN
15.0000 mL | Freq: Once | OROMUCOSAL | Status: AC
  Administered 2023-01-21: 15 mL via OROMUCOSAL
  Filled 2023-01-21: qty 15

## 2023-01-21 MED ORDER — PHENYLEPHRINE HCL-NACL 20-0.9 MG/250ML-% IV SOLN
INTRAVENOUS | Status: DC | PRN
Start: 2023-01-21 — End: 2023-01-21
  Administered 2023-01-21: 40 ug/min via INTRAVENOUS

## 2023-01-21 MED ORDER — INSULIN ASPART 100 UNIT/ML IJ SOLN: 0.0000 [IU] | INTRAMUSCULAR | Status: DC | PRN

## 2023-01-21 MED ORDER — SODIUM CHLORIDE 0.9% FLUSH: 3.0000 mL | Freq: Two times a day (BID) | INTRAVENOUS | Status: DC

## 2023-01-21 MED ORDER — FUROSEMIDE 40 MG PO TABS
40.0000 mg | ORAL_TABLET | Freq: Every day | ORAL | Status: DC
  Filled 2023-01-21 (×2): qty 1

## 2023-01-21 MED ORDER — KETAMINE HCL 50 MG/5ML IJ SOSY
PREFILLED_SYRINGE | INTRAMUSCULAR | Status: AC
  Filled 2023-01-21: qty 5

## 2023-01-21 MED ORDER — MIDAZOLAM HCL 2 MG/2ML IJ SOLN
INTRAMUSCULAR | Status: DC | PRN
  Administered 2023-01-21: 2 mg via INTRAVENOUS

## 2023-01-21 MED ORDER — HYDROMORPHONE HCL 1 MG/ML IJ SOLN: 0.5000 mg | INTRAMUSCULAR | Status: DC | PRN

## 2023-01-21 MED ORDER — MIDAZOLAM HCL 2 MG/2ML IJ SOLN
INTRAMUSCULAR | Status: AC
  Filled 2023-01-21: qty 2

## 2023-01-21 MED ORDER — AMLODIPINE BESYLATE 10 MG PO TABS
10.0000 mg | ORAL_TABLET | Freq: Every day | ORAL | Status: DC
  Administered 2023-01-22: 10 mg via ORAL
  Filled 2023-01-21: qty 1

## 2023-01-21 MED ORDER — ORAL CARE MOUTH RINSE: 15.0000 mL | Freq: Once | OROMUCOSAL | Status: AC

## 2023-01-21 MED ORDER — BUPIVACAINE HCL (PF) 0.25 % IJ SOLN
INTRAMUSCULAR | Status: AC
  Filled 2023-01-21: qty 30

## 2023-01-21 MED ORDER — ACETAMINOPHEN 325 MG PO TABS: 650.0000 mg | ORAL_TABLET | ORAL | Status: DC | PRN

## 2023-01-21 MED ORDER — ONDANSETRON HCL 4 MG/2ML IJ SOLN: 4.0000 mg | Freq: Four times a day (QID) | INTRAMUSCULAR | Status: DC | PRN

## 2023-01-21 MED ORDER — CEFAZOLIN SODIUM-DEXTROSE 2-4 GM/100ML-% IV SOLN
2.0000 g | INTRAVENOUS | Status: AC
  Administered 2023-01-21: 2 g via INTRAVENOUS
  Filled 2023-01-21: qty 100

## 2023-01-21 MED ORDER — FENTANYL CITRATE (PF) 250 MCG/5ML IJ SOLN
INTRAMUSCULAR | Status: AC
  Filled 2023-01-21: qty 5

## 2023-01-21 MED ORDER — METHOCARBAMOL 1000 MG/10ML IJ SOLN: 500.0000 mg | Freq: Four times a day (QID) | INTRAVENOUS | Status: DC | PRN

## 2023-01-21 MED ORDER — THROMBIN 5000 UNITS EX SOLR: OROMUCOSAL | Status: DC | PRN

## 2023-01-21 MED ORDER — HYDROMORPHONE HCL 1 MG/ML IJ SOLN: 0.2500 mg | INTRAMUSCULAR | Status: DC | PRN

## 2023-01-21 SURGICAL SUPPLY — 40 items
APL SKNCLS STERI-STRIP NONHPOA (GAUZE/BANDAGES/DRESSINGS) ×1
BAG COUNTER SPONGE SURGICOUNT (BAG) ×1 IMPLANT
BAG SPNG CNTER NS LX DISP (BAG) ×1
BENZOIN TINCTURE PRP APPL 2/3 (GAUZE/BANDAGES/DRESSINGS) ×1 IMPLANT
BUR CARBIDE MATCH 3.0 (BURR) ×1 IMPLANT
CANISTER SUCT 3000ML PPV (MISCELLANEOUS) ×1 IMPLANT
DRAPE LAPAROTOMY 100X72X124 (DRAPES) ×1 IMPLANT
DRAPE MICROSCOPE SLANT 54X150 (MISCELLANEOUS) ×1 IMPLANT
DRAPE SURG 17X23 STRL (DRAPES) ×1 IMPLANT
DRSG OPSITE POSTOP 4X6 (GAUZE/BANDAGES/DRESSINGS) IMPLANT
DURAPREP 26ML APPLICATOR (WOUND CARE) ×1 IMPLANT
ELECT REM PT RETURN 9FT ADLT (ELECTROSURGICAL) ×1
ELECTRODE REM PT RTRN 9FT ADLT (ELECTROSURGICAL) ×1 IMPLANT
GAUZE 4X4 16PLY ~~LOC~~+RFID DBL (SPONGE) IMPLANT
GLOVE BIO SURGEON STRL SZ7 (GLOVE) IMPLANT
GLOVE BIO SURGEON STRL SZ8 (GLOVE) ×1 IMPLANT
GLOVE BIOGEL PI IND STRL 7.0 (GLOVE) IMPLANT
GOWN STRL REUS W/ TWL LRG LVL3 (GOWN DISPOSABLE) IMPLANT
GOWN STRL REUS W/ TWL XL LVL3 (GOWN DISPOSABLE) ×1 IMPLANT
GOWN STRL REUS W/TWL 2XL LVL3 (GOWN DISPOSABLE) IMPLANT
GOWN STRL REUS W/TWL LRG LVL3 (GOWN DISPOSABLE)
GOWN STRL REUS W/TWL XL LVL3 (GOWN DISPOSABLE) ×1
HEMOSTAT POWDER KIT SURGIFOAM (HEMOSTASIS) ×1 IMPLANT
KIT BASIN OR (CUSTOM PROCEDURE TRAY) ×1 IMPLANT
KIT TURNOVER KIT B (KITS) ×1 IMPLANT
NDL HYPO 25X1 1.5 SAFETY (NEEDLE) ×1 IMPLANT
NDL SPNL 20GX3.5 QUINCKE YW (NEEDLE) IMPLANT
NEEDLE HYPO 25X1 1.5 SAFETY (NEEDLE) ×1 IMPLANT
NEEDLE SPNL 20GX3.5 QUINCKE YW (NEEDLE) IMPLANT
NS IRRIG 1000ML POUR BTL (IV SOLUTION) ×1 IMPLANT
PACK LAMINECTOMY NEURO (CUSTOM PROCEDURE TRAY) ×1 IMPLANT
PAD ARMBOARD 7.5X6 YLW CONV (MISCELLANEOUS) ×3 IMPLANT
STRIP CLOSURE SKIN 1/2X4 (GAUZE/BANDAGES/DRESSINGS) ×1 IMPLANT
SUT VIC AB 0 CT1 18XCR BRD8 (SUTURE) ×1 IMPLANT
SUT VIC AB 0 CT1 8-18 (SUTURE) ×1
SUT VIC AB 2-0 CP2 18 (SUTURE) ×1 IMPLANT
SUT VIC AB 3-0 SH 8-18 (SUTURE) ×1 IMPLANT
TOWEL GREEN STERILE (TOWEL DISPOSABLE) ×1 IMPLANT
TOWEL GREEN STERILE FF (TOWEL DISPOSABLE) ×1 IMPLANT
WATER STERILE IRR 1000ML POUR (IV SOLUTION) ×1 IMPLANT

## 2023-01-21 NOTE — Anesthesia Postprocedure Evaluation (Signed)
Anesthesia Post Note  Patient: Shirley Chen  Procedure(s) Performed: Microdiscectomy -Right Lumbar Four-Lumbar Five (Right: Back)     Patient location during evaluation: PACU Anesthesia Type: General Level of consciousness: awake and alert Pain management: pain level controlled Vital Signs Assessment: post-procedure vital signs reviewed and stable Respiratory status: spontaneous breathing, nonlabored ventilation and respiratory function stable Cardiovascular status: blood pressure returned to baseline Postop Assessment: no apparent nausea or vomiting Anesthetic complications: no   No notable events documented.  Last Vitals:  Vitals:   01/21/23 1100 01/21/23 1132  BP: (!) 116/55 105/73  Pulse: 63 63  Resp: 10 20  Temp: (!) 36.1 C 36.6 C  SpO2: 96% 95%    Last Pain:  Vitals:   01/21/23 1025  TempSrc:   PainSc: 0-No pain                 Shanda Howells

## 2023-01-21 NOTE — Anesthesia Procedure Notes (Signed)
Procedure Name: Intubation Date/Time: 01/21/2023 8:42 AM  Performed by: Gus Puma, CRNAPre-anesthesia Checklist: Patient identified, Emergency Drugs available, Suction available and Patient being monitored Patient Re-evaluated:Patient Re-evaluated prior to induction Oxygen Delivery Method: Circle system utilized Preoxygenation: Pre-oxygenation with 100% oxygen Induction Type: IV induction Ventilation: Mask ventilation without difficulty Laryngoscope Size: Mac and 3 Grade View: Grade I Tube type: Oral Tube size: 7.0 mm Number of attempts: 1 Airway Equipment and Method: Stylet and Oral airway Placement Confirmation: ETT inserted through vocal cords under direct vision, positive ETCO2 and breath sounds checked- equal and bilateral Secured at: 22 cm Tube secured with: Tape Dental Injury: Teeth and Oropharynx as per pre-operative assessment  Comments: Intubation by Roe Coombs under MD Sampson Goon supervision

## 2023-01-21 NOTE — Transfer of Care (Signed)
Immediate Anesthesia Transfer of Care Note  Patient: Shirley Chen  Procedure(s) Performed: Microdiscectomy -Right Lumbar Four-Lumbar Five (Right: Back)  Patient Location: PACU  Anesthesia Type:General  Level of Consciousness: drowsy and patient cooperative  Airway & Oxygen Therapy: Patient Spontanous Breathing and Patient connected to face mask oxygen  Post-op Assessment: Report given to RN and Post -op Vital signs reviewed and stable  Post vital signs: Reviewed and stable  Last Vitals:  Vitals Value Taken Time  BP 139/60 01/21/23 1025  Temp    Pulse 64 01/21/23 1027  Resp 14 01/21/23 1027  SpO2 100 % 01/21/23 1027  Vitals shown include unfiled device data.  Last Pain:  Vitals:   01/21/23 0707  TempSrc:   PainSc: 3       Patients Stated Pain Goal: 3 (01/21/23 0707)  Complications: No notable events documented.

## 2023-01-21 NOTE — Op Note (Signed)
01/21/2023  10:25 AM  PATIENT:  Shirley Chen  67 y.o. female  PRE-OPERATIVE DIAGNOSIS: L4-5 spinal stenosis with right L4-5 disc herniation with back and right leg pain  POST-OPERATIVE DIAGNOSIS:  same  PROCEDURE: Right L4-5 hemilaminectomy medial facetectomy and foraminotomy followed by microdiscectomy utilizing microscopic dissection  SURGEON:  Marikay Alar, MD  ASSISTANTS: Verlin Dike, FNP  ANESTHESIA:   General  EBL: 25 ml  Total I/O In: 250 [I.V.:250] Out: -   BLOOD ADMINISTERED: none  DRAINS: None  SPECIMEN:  none  INDICATION FOR PROCEDURE: This patient presented with acute on chronic back and right leg pain. Imaging showed spinal stenosis L4-5 with a large right-sided disc herniation. The patient tried conservative measures without relief. Pain was debilitating. Recommended decompressive hemilaminectomy with microdiscectomy. Patient understood the risks, benefits, and alternatives and potential outcomes and wished to proceed.  PROCEDURE DETAILS: The patient was taken to the operating room and after induction of adequate generalized endotracheal anesthesia, the patient was rolled into the prone position on the Wilson frame and all pressure points were padded. The lumbar region was cleaned and then prepped with DuraPrep and draped in the usual sterile fashion. 5 cc of local anesthesia was injected and then a dorsal midline incision was made and carried down to the lumbo sacral fascia. The fascia was opened and the paraspinous musculature was taken down in a subperiosteal fashion to expose L4-5 on the right. Intraoperative x-ray confirmed my level, and then I used a combination of the high-speed drill and the Kerrison punches to perform a hemilaminectomy, medial facetectomy, and foraminotomy at L4 5 on the right. The underlying yellow ligament was opened and removed in a piecemeal fashion to expose the underlying dura and exiting nerve root. I undercut the lateral recess and  dissected down until I was medial to and distal to the pedicle. The nerve root was well decompressed. We then gently retracted the nerve root medially with a retractor, coagulated the epidural venous vasculature, and found a very large disc herniation.  It was quite compressive and the L5 nerve root was quite taut over it.  Utilizing microscopic dissection we incised the disc space. We performed a thorough intradiscal discectomy with pituitary rongeurs and curettes, until I had a nice decompression of the nerve root and the midline. I then palpated with a coronary dilator along the nerve root and into the foramen to assure adequate decompression. I felt no more compression of the nerve root. I irrigated with saline solution containing bacitracin. Achieved hemostasis with bipolar cautery, and then closed the fascia with 0 Vicryl. I closed the subcutaneous tissues with 2-0 Vicryl and the subcuticular tissues with 3-0 Vicryl. The skin was then closed with benzoin and Steri-Strips. The drapes were removed, a sterile dressing was applied.  My nurse practitioner was involved in the exposure, safe retraction of the neural elements, the disc work and the closure. the patient was awakened from general anesthesia and transferred to the recovery room in stable condition. At the end of the procedure all sponge, needle and instrument counts were correct.    PLAN OF CARE: Admit for overnight observation  PATIENT DISPOSITION:  PACU - hemodynamically stable.   Delay start of Pharmacological VTE agent (>24hrs) due to surgical blood loss or risk of bleeding:  yes
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
## 2023-01-22 ENCOUNTER — Encounter (HOSPITAL_COMMUNITY): Payer: Self-pay | Admitting: Neurological Surgery

## 2023-01-22 ENCOUNTER — Other Ambulatory Visit (HOSPITAL_COMMUNITY): Payer: Self-pay

## 2023-01-22 DIAGNOSIS — M48061 Spinal stenosis, lumbar region without neurogenic claudication: Secondary | ICD-10-CM | POA: Diagnosis not present

## 2023-01-22 LAB — GLUCOSE, CAPILLARY
Glucose-Capillary: 226 mg/dL — ABNORMAL HIGH (ref 70–99)
Glucose-Capillary: 264 mg/dL — ABNORMAL HIGH (ref 70–99)
Glucose-Capillary: 203 mg/dL — ABNORMAL HIGH (ref 70–99)

## 2023-01-22 MED ORDER — OXYCODONE-ACETAMINOPHEN 7.5-325 MG PO TABS
1.0000 | ORAL_TABLET | Freq: Four times a day (QID) | ORAL | 0 refills | Status: DC | PRN
  Filled 2023-01-22: qty 30, 8d supply, fill #0

## 2023-01-22 NOTE — Evaluation (Signed)
Occupational Therapy Evaluation Patient Details Name: Shirley Chen MRN: [id number] DOB: [birth year] Today's Date: 01/22/2023   History of Present Illness Pt is a 67 y/o female who presents s/p L4-L5 hemilaminectomy, medial facetectomy and foraminotomy, followed by microdiscectomy on 01/21/2023. PMH significant for DM II, emphysema, HTN, R great toe amputation 2016, L BKA 2022, fem-tib bypass graft on R 2016.   Clinical Impression   Pt was functioning modified independent in self care and most IADLs. Presents with post surgical pain. Ambulated to bathroom and in room with prosthesis and RW mod I. Educated in back precautions related to ADLs and IADLs, IADLs to avoid and compensatory strategies for ADLs. Reinforced with written handout. Pt verbalized understanding. No further OT needs.       If plan is discharge home, recommend the following: Assistance with cooking/housework;Assist for transportation;Help with stairs or ramp for entrance    Functional Status Assessment  Patient has had a recent decline in their functional status and demonstrates the ability to make significant improvements in function in a reasonable and predictable amount of time.  Equipment Recommendations  None recommended by OT    Recommendations for Other Services       Precautions / Restrictions Precautions Precautions: Fall;Back Precaution Booklet Issued: Yes (comment) Precaution Comments: Reviewed handout and pt was cued for precautions during functional mobility. Required Braces or Orthoses:  (No brace needed order for back but pt has a prosthesis for BKA) Restrictions Weight Bearing Restrictions: No      Mobility Bed Mobility Overal bed mobility: Modified Independent, Needs Assistance Bed Mobility: Rolling, Sidelying to Sit, Sit to Sidelying Rolling: Modified independent (Device/Increase time) Sidelying to sit: Contact guard assist     Sit to sidelying: Contact guard assist General bed mobility  comments: cues for log roll technique    Transfers Overall transfer level: Needs assistance Equipment used: Rolling walker (2 wheels) Transfers: Sit to/from Stand Sit to Stand: Modified independent (Device/Increase time)           General transfer comment: from toilet and bed      Balance                                           ADL either performed or assessed with clinical judgement   ADL Overall ADL's : Modified independent                                       General ADL Comments: Educated in compensatory strategies for ADLs and IADLs to avoid. Pt has a tub transfer bench, sponge bathes.     Vision Ability to See in Adequate Light: 0 Adequate Patient Visual Report: No change from baseline       Perception         Praxis         Pertinent Vitals/Pain Pain Assessment Pain Assessment: Faces Faces Pain Scale: Hurts little more Pain Location: incision site Pain Descriptors / Indicators: Operative site guarding, Sore Pain Intervention(s): Premedicated before session, Monitored during session     Extremity/Trunk Assessment Upper Extremity Assessment Upper Extremity Assessment: Overall WFL for tasks assessed   Lower Extremity Assessment Lower Extremity Assessment: Defer to PT evaluation (L BKA with prosthesis)   Cervical / Trunk Assessment Cervical / Trunk Assessment: Back Surgery   Communication Communication Communication:  No apparent difficulties Cueing Techniques: Verbal cues;Gestural cues   Cognition Arousal: Alert Behavior During Therapy: WFL for tasks assessed/performed Overall Cognitive Status: Within Functional Limits for tasks assessed                                 General Comments: tearful at times     General Comments       Exercises     Shoulder Instructions      Home Living Family/patient expects to be discharged to:: Private residence Living Arrangements: Other relatives  (son in law and granddaughter) Available Help at Discharge: Family;Available PRN/intermittently Type of Home: House Home Access: Stairs to enter [employer] of Steps: 3 Entrance Stairs-Rails: Right;Left Home Layout: One level     Bathroom Shower/Tub: Chief Strategy Officer: Standard     Home Equipment: Architectural technologist (4 wheels);Wheelchair - Forensic psychologist (2 wheels);Tub bench;Adaptive equipment Adaptive Equipment: Reacher;Sock aid        Prior Functioning/Environment Prior Level of Function : Needs assist             Mobility Comments: ambulatory with prosthesis and rollator ADLs Comments: assist for transportation and groceries, Mod I in ADLs and IADLs, sponge bathes        OT Problem List:        OT Treatment/Interventions:      OT Goals(Current goals can be found in the care plan section)    OT Frequency:      Co-evaluation              AM-PAC OT "6 Clicks" Daily Activity     Outcome Measure Help from another person eating meals?: None Help from another person taking care of personal grooming?: None Help from another person toileting, which includes using toliet, bedpan, or urinal?: None Help from another person bathing (including washing, rinsing, drying)?: None Help from another person to put on and taking off regular upper body clothing?: None Help from another person to put on and taking off regular lower body clothing?: None 6 Click Score: 24   End of Session Equipment Utilized During Treatment: Rolling walker (2 wheels)  Activity Tolerance: Patient tolerated treatment well Patient left: in bed;with call bell/phone within reach  OT Visit Diagnosis: Unsteadiness on feet (R26.81);Other abnormalities of gait and mobility (R26.89);Pain                Time: 2725-3664 OT Time Calculation (min): 36 min Charges:  OT General Charges $OT Visit: 1 Visit OT Evaluation $OT Eval Moderate Complexity: 1 Mod OT  Treatments $Self Care/Home Management : 8-22 mins  Berna Spare, OTR/L Acute Rehabilitation Services Office: [phone]   Evern Bio 01/22/2023, 11:40 AM

## 2023-01-22 NOTE — Progress Notes (Signed)
Physical Therapy Brief Evaluation and Discharge Note  Patient Details Name: Shirley Chen MRN: [id number] DOB: [birth year] Today's Date: 01/22/2023   History of Present Illness  Pt is a 67 y/o female who presents s/p L4-L5 hemilaminectomy, medial facetectomy and foraminotomy, followed by microdiscectomy on 01/21/2023. PMH significant for DM II, emphysema, HTN, R great toe amputation 2016, L BKA 2022, fem-tib bypass graft on R 2016.   Clinical Impression  Patient evaluated by Physical Therapy with no further acute PT needs identified. All education has been completed and the patient has no further questions. Pt was able to demonstrate transfers and ambulation with gross supervision for safety and RW for support. Pt was educated on precautions, positioning recommendations, appropriate activity progression, and car transfer. See below for any follow-up Physical Therapy or equipment needs. PT is signing off. Thank you for this referral.          PT Assessment Patient does not need any further PT services  Assistance Needed at Discharge  PRN    Equipment Recommendations None recommended by PT  Recommendations for Other Services       Precautions/Restrictions Precautions Precautions: Fall;Back Precaution Booklet Issued: Yes (comment) Precaution Comments: Reviewed handout and pt was cued for precautions during functional mobility. Required Braces or Orthoses:  (No brace needed order for back but pt has a prosthesis for BKA) Restrictions Weight Bearing Restrictions: No        Mobility  Bed Mobility       General bed mobility comments: Pt was received sitting up on EOB.  Transfers Overall transfer level: Needs assistance Equipment used: Rolling walker (2 wheels) Transfers: Sit to/from Stand Sit to Stand: Supervision           General transfer comment: Light supervision for safety as pt powered up to full stand.    Ambulation/Gait Ambulation/Gait assistance:  Supervision Gait Distance (Feet): 200 Feet Assistive device: Rolling walker (2 wheels) Gait Pattern/deviations: Step-through pattern, Decreased stride length, Trunk flexed Gait Speed: Pace WFL General Gait Details: Pt ambulating well but with flexed trunk throughout session. No assist required. Able to minimally correct posture with cues but unable to maintain.  Home Activity Instructions    Stairs Stairs: Yes Stairs assistance: Contact guard assist Stair Management: Two rails, Step to pattern, Forwards Number of Stairs: 3 General stair comments: VC's for sequencing and general safety. No assist required but hands on guarding provided throughout for safety.  Modified Rankin (Stroke Patients Only)        Balance Overall balance assessment: Mild deficits observed, not formally tested             Standing balance comment: Mild but likely baseline due to BKA          Pertinent Vitals/Pain PT - Brief Vital Signs All Vital Signs Stable: Yes Pain Assessment Pain Assessment: Faces Faces Pain Scale: Hurts little more Pain Location: incision site Pain Descriptors / Indicators: Operative site guarding, Sore Pain Intervention(s): Limited activity within patient's tolerance, Monitored during session, Repositioned     Home Living Family/patient expects to be discharged to:: Private residence Living Arrangements: Other relatives (son-in-law and 31 y/o granddaughter) Available Help at Discharge: Family;Available PRN/intermittently Home Environment: Stairs to enter;Rail - right  Stairs-Number of Steps: 3 Home Equipment: Architectural technologist (4 wheels);Wheelchair - Forensic psychologist (2 wheels);Tub bench;Adaptive equipment Adaptive Equipment: Reacher;Sock aid      Prior Function Level of Independence: Independent with assistive device(s) Comments: Uses the rollator and QC when entering home  UE/LE Assessment        LE ROM/Strength/Tone/Coordination: WFL (L BKA)       Communication   Communication Communication: No apparent difficulties Cueing Techniques: Verbal cues;Gestural cues     Cognition Overall Cognitive Status: Appears within functional limits for tasks assessed/performed       General Comments      Exercises     Assessment/Plan    PT Problem List         PT Visit Diagnosis Unsteadiness on feet (R26.81);Pain    No Skilled PT All education completed;Patient is supervision for all activity/mobility   Co-evaluation                AMPAC 6 Clicks Help needed turning from your back to your side while in a flat bed without using bedrails?: None Help needed moving from lying on your back to sitting on the side of a flat bed without using bedrails?: None Help needed moving to and from a bed to a chair (including a wheelchair)?: A Little Help needed standing up from a chair using your arms (e.g., wheelchair or bedside chair)?: A Little Help needed to walk in hospital room?: A Little Help needed climbing 3-5 steps with a railing? : A Little 6 Click Score: 20      End of Session Equipment Utilized During Treatment: Gait belt Activity Tolerance: Patient tolerated treatment well Patient left: in bed;with call bell/phone within reach Nurse Communication: Mobility status PT Visit Diagnosis: Unsteadiness on feet (R26.81);Pain Pain - part of body:  (back)     Time: 0945-1000 PT Time Calculation (min) (ACUTE ONLY): 15 min  Charges:   PT Evaluation $PT Eval Low Complexity: 1 Low      Conni Slipper, PT, DPT Acute Rehabilitation Services Secure Chat Preferred Office: [phone]   Marylynn Pearson  01/22/2023, 10:42 AM

## 2023-01-22 NOTE — Discharge Summary (Signed)
Physician Discharge Summary  Patient ID: Shirley Chen MRN: [id number] DOB/AGE: 08-31-1955 67 y.o.  Admit date: 01/21/2023 Discharge date: 01/22/2023  Admission Diagnoses: Lumbar spinal stenosis with right radiculopathy   Discharge Diagnoses: Same   Discharged Condition: good  Hospital Course: The patient was admitted on 01/21/2023 and taken to the operating room where the patient underwent right L4-5 hemilaminectomy with microdiscectomy. The patient tolerated the procedure well and was taken to the recovery room and then to the floor in stable condition. The hospital course was routine. There were no complications. The wound remained clean dry and intact. Pt had appropriate back soreness. No complaints of leg pain or new N/T/W. The patient remained afebrile with stable vital signs, and tolerated a regular diet. The patient continued to increase activities, and pain was well controlled with oral pain medications.   Consults: None  Significant Diagnostic Studies:  Results for orders placed or performed during the hospital encounter of 01/21/23  Glucose, capillary  Result Value Ref Range   Glucose-Capillary 105 (H) 70 - 99 mg/dL  Glucose, capillary  Result Value Ref Range   Glucose-Capillary 105 (H) 70 - 99 mg/dL  Glucose, capillary  Result Value Ref Range   Glucose-Capillary 352 (H) 70 - 99 mg/dL   Comment 1 Notify RN    Comment 2 Document in Chart   Glucose, capillary  Result Value Ref Range   Glucose-Capillary 298 (H) 70 - 99 mg/dL   Comment 1 Notify RN    Comment 2 Document in Chart   Glucose, capillary  Result Value Ref Range   Glucose-Capillary 226 (H) 70 - 99 mg/dL   Comment 1 Notify RN    Comment 2 Document in Chart     DG Lumbar Spine 2-3 Views  Result Date: 01/21/2023 CLINICAL DATA:  Micro discectomy EXAM: LUMBAR SPINE - 2-3 VIEW COMPARISON:  MRI 11/17/2022 FINDINGS: Initial image shows a needle at the inter spinous space of L4-5. Second film shows tissue  spreaders with a probe directed at the L4-5 disc level. IMPRESSION: L4-5 level localized. Electronically Signed   By: Paulina Fusi M.D.   On: 01/21/2023 14:11    Antibiotics:  Anti-infectives (From admission, onward)    Start     Dose/Rate Route Frequency Ordered Stop   01/21/23 1600  ceFAZolin (ANCEF) IVPB 2g/100 mL premix        2 g 200 mL/hr over 30 Minutes Intravenous Every 8 hours 01/21/23 1108 01/21/23 2318   01/21/23 0700  ceFAZolin (ANCEF) IVPB 2g/100 mL premix        2 g 200 mL/hr over 30 Minutes Intravenous On call to O.R. 01/21/23 5284 01/21/23 0854       Discharge Exam: Blood pressure (!) 105/90, pulse 74, temperature 98.3 F (36.8 C), temperature source Oral, resp. rate 16, height 5\' 4"  (1.626 m), weight 95.3 kg, SpO2 97%. Neurologic: Grossly normal Dressing clean dry and intact  Discharge Medications:   Allergies as of 01/22/2023       Reactions   Losartan Nausea And Vomiting   Morphine And Codeine    Pt states " I just got really sick"   Sulfa Antibiotics Itching        Medication List     TAKE these medications    Accu-Chek Guide test strip Generic drug: glucose blood USE 1 STRIP TO CHECK BLOOD  GLUCOSE 3 TIMES DAILY   amLODipine 10 MG tablet Commonly known as: NORVASC TAKE 1 TABLET BY MOUTH DAILY   atorvastatin 40 MG  tablet Commonly known as: LIPITOR TAKE 1 TABLET BY MOUTH DAILY   bisacodyl 5 MG EC tablet Take 10 mg by mouth daily as needed for moderate constipation.   blood glucose meter kit and supplies Kit Dispense based on patient and insurance preference. Use up to four times daily as directed. (FOR ICD-9 250.00, 250.01).   carvedilol 25 MG tablet Commonly known as: COREG TAKE 1 TABLET BY MOUTH TWICE  DAILY   cyanocobalamin 1000 MCG tablet Commonly known as: VITAMIN B12 Take 1,000 mcg by mouth daily.   docusate sodium 100 MG capsule Commonly known as: COLACE Take 100 mg by mouth daily.   ezetimibe 10 MG tablet Commonly known  as: ZETIA TAKE 1 TABLET BY MOUTH DAILY   ferrous sulfate 325 (65 FE) MG EC tablet Take 325 mg by mouth daily with breakfast.   furosemide 40 MG tablet Commonly known as: LASIX TAKE 1 TABLET BY MOUTH DAILY ON  MONDAY, WEDNESDAY,FRIDAY,  SATURDAY AND SUNDAY.(ALTERNATE  WITH 20MG  TABLET) What changed: See the new instructions.   glucose monitoring kit monitoring kit 1 each by Does not apply route 4 (four) times daily - after meals and at bedtime. 1 month Diabetic Testing Supplies for QAC-QHS accuchecks. Any brand OK. Diagnosis E11.65   hydrALAZINE 50 MG tablet Commonly known as: APRESOLINE TAKE 1 TABLET BY MOUTH 3 TIMES  DAILY   insulin lispro protamine-lispro (75-25) 100 UNIT/ML Susp injection Commonly known as: HUMALOG 75/25 MIX Inject 32 Units into the skin 2 (two) times daily.   oxyCODONE-acetaminophen 7.5-325 MG tablet Commonly known as: PERCOCET Take 1 tablet by mouth every 6 (six) hours as needed for severe pain (pain score 7-10).   Vitamin D 50 MCG (2000 UT) tablet Take 2,000 Units by mouth daily.        Disposition: Home   Final Dx: Right L4-5 hemilaminectomy and microdiscectomy  Discharge Instructions      Remove dressing in 72 hours   Complete by: As directed    Call MD for:  difficulty breathing, headache or visual disturbances   Complete by: As directed    Call MD for:  persistant nausea and vomiting   Complete by: As directed    Call MD for:  redness, tenderness, or signs of infection (pain, swelling, redness, odor or green/yellow discharge around incision site)   Complete by: As directed    Call MD for:  severe uncontrolled pain   Complete by: As directed    Call MD for:  temperature >100.4   Complete by: As directed    Diet - low sodium heart healthy   Complete by: As directed    Discharge instructions   Complete by: As directed    May shower, no heavy lifting, no strenuous activity, no repetitive bending at the waist, no driving for 1 week    Increase activity slowly   Complete by: As directed           Signed: Tia Alert 01/22/2023, 8:09 AM

## 2023-01-22 NOTE — Progress Notes (Signed)
Discharged instructions/education/AVS/Rx given to patient and verbalized understanding. Pain is mild to moderate and controlled by PRN pain meds. No drainage, no swelling, no redness noted on incision site. Ambulating with walker well with minimal supervision. Voiding and emptying bladder well. Discharged via wheelchair.

## 2023-01-24 NOTE — Plan of Care (Signed)
CHL Tonsillectomy/Adenoidectomy, Postoperative PEDS care plan entered in error.

## 2023-01-30 ENCOUNTER — Encounter: Payer: Self-pay | Admitting: Physician Assistant

## 2023-01-30 ENCOUNTER — Ambulatory Visit (INDEPENDENT_AMBULATORY_CARE_PROVIDER_SITE_OTHER): Payer: 59 | Admitting: Physician Assistant

## 2023-01-30 VITALS — BP 136/76 | HR 73 | Temp 97.8°F | Ht 64.0 in | Wt 203.8 lb

## 2023-01-30 DIAGNOSIS — E1122 Type 2 diabetes mellitus with diabetic chronic kidney disease: Secondary | ICD-10-CM

## 2023-01-30 DIAGNOSIS — N1832 Chronic kidney disease, stage 3b: Secondary | ICD-10-CM | POA: Diagnosis not present

## 2023-01-30 DIAGNOSIS — E1140 Type 2 diabetes mellitus with diabetic neuropathy, unspecified: Secondary | ICD-10-CM

## 2023-01-30 DIAGNOSIS — Z23 Encounter for immunization: Secondary | ICD-10-CM | POA: Diagnosis not present

## 2023-01-30 DIAGNOSIS — Z794 Long term (current) use of insulin: Secondary | ICD-10-CM

## 2023-01-30 DIAGNOSIS — Z9889 Other specified postprocedural states: Secondary | ICD-10-CM
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
## 2023-02-01 DIAGNOSIS — Z9889 Other specified postprocedural states: Secondary | ICD-10-CM | POA: Insufficient documentation

## 2023-02-01 NOTE — Assessment & Plan Note (Signed)
Healing well Reassured no signs of infection F/up with surgeon as scheduled

## 2023-02-01 NOTE — Assessment & Plan Note (Signed)
Lab Results  Component Value Date   HGBA1C 7.0 (H) 01/16/2023   HGBA1C 8.7 (A) 11/11/2022   HGBA1C 8.4 (H) 08/11/2022   Seeing good improvement.   Currently taking Humalog 75/25 - 33 units in morning and 30 units in evening. Rybelsus 3 mg daily.   Continue good lifestyle changes.  Exercise is still limited due to physical condition.

## 2023-02-22 ENCOUNTER — Other Ambulatory Visit: Payer: Self-pay | Admitting: Physician Assistant

## 2023-02-28 ENCOUNTER — Other Ambulatory Visit: Payer: Self-pay | Admitting: Physician Assistant

## 2023-02-28 DIAGNOSIS — I10 Essential (primary) hypertension: Secondary | ICD-10-CM

## 2023-02-28 DIAGNOSIS — E785 Hyperlipidemia, unspecified: Secondary | ICD-10-CM

## 2023-03-13 ENCOUNTER — Ambulatory Visit: Payer: 59 | Admitting: Physician Assistant

## 2023-04-11 ENCOUNTER — Other Ambulatory Visit: Payer: Self-pay | Admitting: Physician Assistant

## 2023-04-13 ENCOUNTER — Ambulatory Visit (INDEPENDENT_AMBULATORY_CARE_PROVIDER_SITE_OTHER): Payer: 59 | Admitting: Physician Assistant

## 2023-04-13 ENCOUNTER — Telehealth: Payer: Self-pay

## 2023-04-13 VITALS — BP 110/70 | HR 72 | Temp 97.5°F | Ht 64.0 in

## 2023-04-13 DIAGNOSIS — E1169 Type 2 diabetes mellitus with other specified complication: Secondary | ICD-10-CM | POA: Diagnosis not present

## 2023-04-13 DIAGNOSIS — Z9889 Other specified postprocedural states: Secondary | ICD-10-CM | POA: Diagnosis not present

## 2023-04-13 DIAGNOSIS — L723 Sebaceous cyst: Secondary | ICD-10-CM

## 2023-04-13 DIAGNOSIS — H539 Unspecified visual disturbance: Secondary | ICD-10-CM

## 2023-04-13 DIAGNOSIS — R600 Localized edema: Secondary | ICD-10-CM

## 2023-04-13 DIAGNOSIS — E785 Hyperlipidemia, unspecified: Secondary | ICD-10-CM

## 2023-04-13 MED ORDER — FUROSEMIDE 40 MG PO TABS: ORAL_TABLET | ORAL | 3 refills | Status: DC
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
## 2023-04-13 NOTE — Telephone Encounter (Signed)
 Copied from CRM [id number]. Topic: Clinical - Prescription Issue >> Apr 13, 2023  3:54 PM Adaysia C wrote: Reason for CRM: Patient received a call from providers office about her RX refill for insulin  lispro protamine -lispro (HUMALOG  75/25 MIX) (75-25) 100 UNIT/ML SUSP injection but she missed the call. Please follow up with patient [phone]   Returned pt call and states she is still using this insulin  BID 32 units but she has plenty of it, advised I would let PCP know

## 2023-04-13 NOTE — Telephone Encounter (Signed)
 Patient medication was discontinue on discharge from hosp, please advise on refill

## 2023-04-13 NOTE — Telephone Encounter (Signed)
 Pt returned call and advised still using this insulin BID at 32 units each time. None needed right now but pharmacy probably sent to office for refill.

## 2023-06-22 ENCOUNTER — Encounter: Payer: Self-pay | Admitting: Physician Assistant

## 2023-06-26 ENCOUNTER — Encounter: Payer: Self-pay | Admitting: Physician Assistant

## 2023-07-15 ENCOUNTER — Other Ambulatory Visit: Payer: Self-pay | Admitting: Physician Assistant

## 2023-07-15 DIAGNOSIS — I10 Essential (primary) hypertension: Secondary | ICD-10-CM

## 2023-08-05 ENCOUNTER — Other Ambulatory Visit: Payer: Self-pay | Admitting: Physician Assistant

## 2023-08-05 DIAGNOSIS — E785 Hyperlipidemia, unspecified: Secondary | ICD-10-CM

## 2023-08-07 ENCOUNTER — Other Ambulatory Visit: Payer: Self-pay | Admitting: Physician Assistant

## 2023-08-07 DIAGNOSIS — I10 Essential (primary) hypertension: Secondary | ICD-10-CM

## 2023-08-11 ENCOUNTER — Encounter: Payer: Self-pay | Admitting: Physician Assistant

## 2023-08-11 ENCOUNTER — Ambulatory Visit (INDEPENDENT_AMBULATORY_CARE_PROVIDER_SITE_OTHER): Payer: 59 | Admitting: Physician Assistant

## 2023-08-11 VITALS — BP 132/74 | HR 64 | Temp 97.3°F | Ht 64.0 in | Wt 225.2 lb

## 2023-08-11 DIAGNOSIS — E1122 Type 2 diabetes mellitus with diabetic chronic kidney disease: Secondary | ICD-10-CM

## 2023-08-11 DIAGNOSIS — E1140 Type 2 diabetes mellitus with diabetic neuropathy, unspecified: Secondary | ICD-10-CM

## 2023-08-11 DIAGNOSIS — Z794 Long term (current) use of insulin: Secondary | ICD-10-CM | POA: Diagnosis not present

## 2023-08-11 DIAGNOSIS — N1832 Chronic kidney disease, stage 3b: Secondary | ICD-10-CM | POA: Diagnosis not present

## 2023-08-11 DIAGNOSIS — G8929 Other chronic pain: Secondary | ICD-10-CM

## 2023-08-11 DIAGNOSIS — M545 Low back pain, unspecified: Secondary | ICD-10-CM

## 2023-08-11 DIAGNOSIS — E1159 Type 2 diabetes mellitus with other circulatory complications: Secondary | ICD-10-CM

## 2023-08-11 DIAGNOSIS — E1169 Type 2 diabetes mellitus with other specified complication: Secondary | ICD-10-CM | POA: Diagnosis not present

## 2023-08-11 DIAGNOSIS — I152 Hypertension secondary to endocrine disorders: Secondary | ICD-10-CM

## 2023-08-11 DIAGNOSIS — E785 Hyperlipidemia, unspecified: Secondary | ICD-10-CM | POA: Diagnosis not present

## 2023-08-11 LAB — CBC WITH DIFFERENTIAL/PLATELET
Basophils Absolute: 0.1 10*3/uL (ref 0.0–0.1)
Basophils Relative: 0.5 % (ref 0.0–3.0)
Eosinophils Absolute: 0.4 10*3/uL (ref 0.0–0.7)
Eosinophils Relative: 2.6 % (ref 0.0–5.0)
HCT: 35.2 % — ABNORMAL LOW (ref 36.0–46.0)
Hemoglobin: 11.6 g/dL — ABNORMAL LOW (ref 12.0–15.0)
Lymphocytes Relative: 16.9 % (ref 12.0–46.0)
Lymphs Abs: 2.8 10*3/uL (ref 0.7–4.0)
MCHC: 32.9 g/dL (ref 30.0–36.0)
MCV: 93.3 fl (ref 78.0–100.0)
Monocytes Absolute: 1 10*3/uL (ref 0.1–1.0)
Monocytes Relative: 6 % (ref 3.0–12.0)
Neutro Abs: 12.2 10*3/uL — ABNORMAL HIGH (ref 1.4–7.7)
Neutrophils Relative %: 74 % (ref 43.0–77.0)
Platelets: 344 10*3/uL (ref 150.0–400.0)
RBC: 3.77 Mil/uL — ABNORMAL LOW (ref 3.87–5.11)
RDW: 13.9 % (ref 11.5–15.5)
WBC: 16.5 10*3/uL — ABNORMAL HIGH (ref 4.0–10.5)

## 2023-08-11 LAB — LIPID PANEL
Cholesterol: 120 mg/dL (ref 0–200)
HDL: 36.5 mg/dL — ABNORMAL LOW (ref >39.00)
LDL Cholesterol: 48 mg/dL (ref 0–99)
NonHDL: 83.26
Total CHOL/HDL Ratio: 3
Triglycerides: 175 mg/dL — ABNORMAL HIGH (ref 0.0–149.0)
VLDL: 35 mg/dL (ref 0.0–40.0)

## 2023-08-11 LAB — MICROALBUMIN / CREATININE URINE RATIO
Creatinine,U: 147 mg/dL
Microalb Creat Ratio: 11.2 mg/g (ref 0.0–30.0)
Microalb, Ur: 1.6 mg/dL (ref 0.0–1.9)

## 2023-08-11 LAB — COMPREHENSIVE METABOLIC PANEL WITH GFR
ALT: 11 U/L (ref 0–35)
AST: 14 U/L (ref 0–37)
Albumin: 4 g/dL (ref 3.5–5.2)
Alkaline Phosphatase: 112 U/L (ref 39–117)
BUN: 34 mg/dL — ABNORMAL HIGH (ref 6–23)
CO2: 25 meq/L (ref 19–32)
Calcium: 9.3 mg/dL (ref 8.4–10.5)
Chloride: 106 meq/L (ref 96–112)
Creatinine, Ser: 1.68 mg/dL — ABNORMAL HIGH (ref 0.40–1.20)
GFR: 31.42 mL/min — ABNORMAL LOW (ref >60.00)
Glucose, Bld: 117 mg/dL — ABNORMAL HIGH (ref 70–99)
Potassium: 4.8 meq/L (ref 3.5–5.1)
Sodium: 141 meq/L (ref 135–145)
Total Bilirubin: 0.7 mg/dL (ref 0.2–1.2)
Total Protein: 6.7 g/dL (ref 6.0–8.3)

## 2023-08-11 LAB — HEMOGLOBIN A1C: Hgb A1c MFr Bld: 9 % — ABNORMAL HIGH (ref 4.6–6.5)

## 2023-08-11 MED ORDER — GABAPENTIN 300 MG PO CAPS: 300.0000 mg | ORAL_CAPSULE | Freq: Two times a day (BID) | ORAL | 3 refills | Status: DC
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
## 2023-08-12 ENCOUNTER — Encounter: Payer: Self-pay | Admitting: Physician Assistant

## 2023-08-13 ENCOUNTER — Other Ambulatory Visit: Payer: Self-pay

## 2023-08-18 ENCOUNTER — Ambulatory Visit: Payer: 59

## 2023-08-20 ENCOUNTER — Ambulatory Visit (INDEPENDENT_AMBULATORY_CARE_PROVIDER_SITE_OTHER): Admitting: Physician Assistant

## 2023-08-20 ENCOUNTER — Other Ambulatory Visit (HOSPITAL_COMMUNITY): Payer: Self-pay

## 2023-08-20 ENCOUNTER — Ambulatory Visit: Payer: Self-pay | Admitting: Physician Assistant

## 2023-08-20 ENCOUNTER — Encounter: Payer: Self-pay | Admitting: Physician Assistant

## 2023-08-20 ENCOUNTER — Other Ambulatory Visit: Payer: Self-pay

## 2023-08-20 ENCOUNTER — Other Ambulatory Visit (HOSPITAL_BASED_OUTPATIENT_CLINIC_OR_DEPARTMENT_OTHER): Payer: Self-pay

## 2023-08-20 VITALS — BP 138/66 | HR 69 | Temp 98.4°F | Ht 64.0 in | Wt 231.6 lb

## 2023-08-20 DIAGNOSIS — L03115 Cellulitis of right lower limb: Secondary | ICD-10-CM

## 2023-08-20 DIAGNOSIS — E1140 Type 2 diabetes mellitus with diabetic neuropathy, unspecified: Secondary | ICD-10-CM

## 2023-08-20 DIAGNOSIS — Z794 Long term (current) use of insulin: Secondary | ICD-10-CM

## 2023-08-20 DIAGNOSIS — Z89512 Acquired absence of left leg below knee: Secondary | ICD-10-CM | POA: Diagnosis not present

## 2023-08-20 DIAGNOSIS — R600 Localized edema: Secondary | ICD-10-CM | POA: Diagnosis not present

## 2023-08-20 LAB — CBC WITH DIFFERENTIAL/PLATELET
Basophils Absolute: 0 10*3/uL (ref 0.0–0.1)
Basophils Relative: 0.3 % (ref 0.0–3.0)
Eosinophils Absolute: 0.3 10*3/uL (ref 0.0–0.7)
Eosinophils Relative: 2.2 % (ref 0.0–5.0)
HCT: 33.9 % — ABNORMAL LOW (ref 36.0–46.0)
Hemoglobin: 11.1 g/dL — ABNORMAL LOW (ref 12.0–15.0)
Lymphocytes Relative: 19.5 % (ref 12.0–46.0)
Lymphs Abs: 2.5 10*3/uL (ref 0.7–4.0)
MCHC: 32.7 g/dL (ref 30.0–36.0)
MCV: 92.1 fl (ref 78.0–100.0)
Monocytes Absolute: 0.9 10*3/uL (ref 0.1–1.0)
Monocytes Relative: 6.8 % (ref 3.0–12.0)
Neutro Abs: 9.2 10*3/uL — ABNORMAL HIGH (ref 1.4–7.7)
Neutrophils Relative %: 71.2 % (ref 43.0–77.0)
Platelets: 311 10*3/uL (ref 150.0–400.0)
RBC: 3.68 Mil/uL — ABNORMAL LOW (ref 3.87–5.11)
RDW: 13.7 % (ref 11.5–15.5)
WBC: 13 10*3/uL — ABNORMAL HIGH (ref 4.0–10.5)

## 2023-08-20 LAB — BASIC METABOLIC PANEL WITH GFR
BUN: 35 mg/dL — ABNORMAL HIGH (ref 6–23)
CO2: 29 meq/L (ref 19–32)
Calcium: 9 mg/dL (ref 8.4–10.5)
Chloride: 106 meq/L (ref 96–112)
Creatinine, Ser: 1.58 mg/dL — ABNORMAL HIGH (ref 0.40–1.20)
GFR: 33.81 mL/min — ABNORMAL LOW (ref >60.00)
Glucose, Bld: 87 mg/dL (ref 70–99)
Potassium: 4.9 meq/L (ref 3.5–5.1)
Sodium: 144 meq/L (ref 135–145)

## 2023-08-20 MED ORDER — CEFTRIAXONE SODIUM 1 G IJ SOLR
1.0000 g | Freq: Once | INTRAMUSCULAR | Status: AC
Start: 2023-08-20 — End: 2023-08-20
  Administered 2023-08-20: 1 g via INTRAMUSCULAR

## 2023-08-20 MED ORDER — CEPHALEXIN 500 MG PO CAPS
500.0000 mg | ORAL_CAPSULE | Freq: Three times a day (TID) | ORAL | 0 refills | Status: AC
Start: 2023-08-20 — End: 2023-08-27
  Filled 2023-08-20 (×2): qty 21, 7d supply, fill #0

## 2023-08-20 MED ORDER — FUROSEMIDE 80 MG PO TABS
80.0000 mg | ORAL_TABLET | Freq: Every day | ORAL | 0 refills | Status: DC
  Filled 2023-08-20 (×2): qty 3, 3d supply, fill #0
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
## 2023-08-21 ENCOUNTER — Other Ambulatory Visit (HOSPITAL_COMMUNITY): Payer: Self-pay

## 2023-08-26 ENCOUNTER — Other Ambulatory Visit (HOSPITAL_COMMUNITY): Payer: Self-pay

## 2023-08-26 ENCOUNTER — Encounter: Payer: Self-pay | Admitting: Physician Assistant

## 2023-08-26 ENCOUNTER — Other Ambulatory Visit: Payer: Self-pay

## 2023-08-26 ENCOUNTER — Ambulatory Visit (INDEPENDENT_AMBULATORY_CARE_PROVIDER_SITE_OTHER): Admitting: Physician Assistant

## 2023-08-26 ENCOUNTER — Other Ambulatory Visit (HOSPITAL_BASED_OUTPATIENT_CLINIC_OR_DEPARTMENT_OTHER): Payer: Self-pay

## 2023-08-26 VITALS — BP 132/66 | HR 73 | Temp 97.3°F | Ht 64.0 in | Wt 231.8 lb

## 2023-08-26 DIAGNOSIS — N1832 Chronic kidney disease, stage 3b: Secondary | ICD-10-CM

## 2023-08-26 DIAGNOSIS — L03115 Cellulitis of right lower limb: Secondary | ICD-10-CM

## 2023-08-26 DIAGNOSIS — R600 Localized edema: Secondary | ICD-10-CM | POA: Diagnosis not present

## 2023-08-26 DIAGNOSIS — E1140 Type 2 diabetes mellitus with diabetic neuropathy, unspecified: Secondary | ICD-10-CM

## 2023-08-26 DIAGNOSIS — Z794 Long term (current) use of insulin: Secondary | ICD-10-CM

## 2023-08-26 DIAGNOSIS — E1122 Type 2 diabetes mellitus with diabetic chronic kidney disease: Secondary | ICD-10-CM | POA: Diagnosis not present

## 2023-08-26 LAB — BASIC METABOLIC PANEL WITH GFR
BUN: 29 mg/dL — ABNORMAL HIGH (ref 6–23)
CO2: 28 meq/L (ref 19–32)
Calcium: 9.1 mg/dL (ref 8.4–10.5)
Chloride: 105 meq/L (ref 96–112)
Creatinine, Ser: 1.61 mg/dL — ABNORMAL HIGH (ref 0.40–1.20)
GFR: 33.05 mL/min — ABNORMAL LOW (ref >60.00)
Glucose, Bld: 126 mg/dL — ABNORMAL HIGH (ref 70–99)
Potassium: 4.6 meq/L (ref 3.5–5.1)
Sodium: 142 meq/L (ref 135–145)

## 2023-08-26 LAB — CBC WITH DIFFERENTIAL/PLATELET
Basophils Absolute: 0.2 10*3/uL — ABNORMAL HIGH (ref 0.0–0.1)
Basophils Relative: 1.3 % (ref 0.0–3.0)
Eosinophils Absolute: 0.3 10*3/uL (ref 0.0–0.7)
Eosinophils Relative: 1.9 % (ref 0.0–5.0)
HCT: 34.1 % — ABNORMAL LOW (ref 36.0–46.0)
Hemoglobin: 11.2 g/dL — ABNORMAL LOW (ref 12.0–15.0)
Lymphocytes Relative: 16.4 % (ref 12.0–46.0)
Lymphs Abs: 2.2 10*3/uL (ref 0.7–4.0)
MCHC: 32.9 g/dL (ref 30.0–36.0)
MCV: 92.9 fl (ref 78.0–100.0)
Monocytes Absolute: 0.9 10*3/uL (ref 0.1–1.0)
Monocytes Relative: 6.9 % (ref 3.0–12.0)
Neutro Abs: 10.1 10*3/uL — ABNORMAL HIGH (ref 1.4–7.7)
Neutrophils Relative %: 73.5 % (ref 43.0–77.0)
Platelets: 309 10*3/uL (ref 150.0–400.0)
RBC: 3.68 Mil/uL — ABNORMAL LOW (ref 3.87–5.11)
RDW: 14.5 % (ref 11.5–15.5)
WBC: 13.7 10*3/uL — ABNORMAL HIGH (ref 4.0–10.5)

## 2023-08-26 MED ORDER — DOXYCYCLINE HYCLATE 100 MG PO TABS
100.0000 mg | ORAL_TABLET | Freq: Two times a day (BID) | ORAL | 0 refills | Status: AC
Start: 2023-08-26 — End: 2023-09-02
  Filled 2023-08-26 (×2): qty 14, 7d supply, fill #0

## 2023-08-26 NOTE — Patient Instructions (Signed)
  VISIT SUMMARY: Today, we addressed the cellulitis in your right lower leg, which has not improved sufficiently. We also reviewed your type 2 diabetes, previous amputations, and chronic kidney disease.  YOUR PLAN: CELLULITIS OF RIGHT LOWER EXTREMITY: You have an infection in your right lower leg that has not improved enough with your current treatment. -Finish your current course of Keflex . -Start taking doxycycline for one week, twice daily. Avoid dairy products when taking this medication. -Keep your leg elevated and use compression stockings. -We will recheck your blood counts and kidney function today. -Watch for signs of worsening infection, such as increased redness, warmth, swelling, or pain, and seek emergency care if these occur.  TYPE 2 DIABETES MELLITUS WITH COMPLICATIONS: Your diabetes has led to complications, including previous amputations, which increase your risk for infections and other issues. -Continue managing your blood sugar levels carefully.  LEFT BELOW-KNEE AMPUTATION AND RIGHT GREAT TOE AMPUTATION: You have had amputations due to diabetes complications, which increase your risk for further complications and infections. -Continue to monitor for any signs of infection or complications in your remaining limbs.  CHRONIC KIDNEY DISEASE, UNSPECIFIED: You have chronic kidney disease, which requires careful management to avoid further damage. -Avoid NSAIDs like Aleve, as they can harm your kidneys. -Continue taking gabapentin  for pain management. -We will recheck your kidney function today.                      Contains text generated by [employer].                                 Contains text generated by [employer].
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
## 2023-08-27 ENCOUNTER — Ambulatory Visit: Payer: Self-pay | Admitting: Physician Assistant

## 2023-09-03 ENCOUNTER — Ambulatory Visit (INDEPENDENT_AMBULATORY_CARE_PROVIDER_SITE_OTHER): Admitting: Physician Assistant

## 2023-09-03 VITALS — BP 110/72 | HR 73 | Temp 98.4°F | Ht 64.0 in | Wt 235.8 lb

## 2023-09-03 DIAGNOSIS — E785 Hyperlipidemia, unspecified: Secondary | ICD-10-CM | POA: Diagnosis not present

## 2023-09-03 DIAGNOSIS — N1832 Chronic kidney disease, stage 3b: Secondary | ICD-10-CM

## 2023-09-03 DIAGNOSIS — J302 Other seasonal allergic rhinitis: Secondary | ICD-10-CM

## 2023-09-03 DIAGNOSIS — Z794 Long term (current) use of insulin: Secondary | ICD-10-CM

## 2023-09-03 DIAGNOSIS — E1122 Type 2 diabetes mellitus with diabetic chronic kidney disease: Secondary | ICD-10-CM

## 2023-09-03 DIAGNOSIS — L03115 Cellulitis of right lower limb: Secondary | ICD-10-CM

## 2023-09-03 DIAGNOSIS — R600 Localized edema: Secondary | ICD-10-CM | POA: Diagnosis not present

## 2023-09-03 MED ORDER — FUROSEMIDE 40 MG PO TABS: 40.0000 mg | ORAL_TABLET | Freq: Every day | ORAL | 3 refills | Status: AC

## 2023-09-03 MED ORDER — EZETIMIBE 10 MG PO TABS: 10.0000 mg | ORAL_TABLET | Freq: Every day | ORAL | 2 refills | Status: DC

## 2023-09-03 MED ORDER — CETIRIZINE HCL 10 MG PO TABS: 10.0000 mg | ORAL_TABLET | Freq: Every day | ORAL | 2 refills | Status: AC

## 2023-09-03 MED ORDER — DAPAGLIFLOZIN PROPANEDIOL 5 MG PO TABS: 5.0000 mg | ORAL_TABLET | Freq: Every day | ORAL | 2 refills | Status: DC
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
## 2023-10-01 ENCOUNTER — Ambulatory Visit

## 2023-10-01 VITALS — Ht 64.0 in | Wt 235.0 lb

## 2023-10-01 DIAGNOSIS — Z0000 Encounter for general adult medical examination without abnormal findings: Secondary | ICD-10-CM | POA: Diagnosis not present
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
## 2023-10-01 NOTE — Patient Instructions (Signed)
 Ms. Register , Thank you for taking time out of your busy schedule to complete your Annual Wellness Visit with me. I enjoyed our conversation and look forward to speaking with you again next year. I, as well as your care team,  appreciate your ongoing commitment to your health goals. Please review the following plan we discussed and let me know if I can assist you in the future. Your Game plan/ To Do List    Referrals: If you haven't heard from the office you've been referred to, please reach out to them at the phone provided.   Follow up Visits: Next Medicare AWV with our clinical staff: 10/10/24   Have you seen your provider in the last 6 months (3 months if uncontrolled diabetes)? Yes Next Office Visit with your provider: 12/15/23  Clinician Recommendations: Each day, aim for 6 glasses of water , plenty of protein in your diet and try to get up and walk/ stretch every hour for 5-10 minutes at a time.        This is a list of the screening recommended for you and due dates:  Health Maintenance  Topic Date Due   Complete foot exam   08/20/2023   Zoster (Shingles) Vaccine (1 of 2) 11/26/2023*   DEXA scan (bone density measurement)  04/12/2024*   DTaP/Tdap/Td vaccine (1 - Tdap) 08/25/2024*   Eye exam for diabetics  10/17/2023   Flu Shot  11/06/2023   Hemoglobin A1C  02/11/2024   Yearly kidney health urinalysis for diabetes  08/10/2024   Yearly kidney function blood test for diabetes  08/25/2024   Medicare Annual Wellness Visit  09/30/2024   Pneumococcal Vaccine for age over 34  Completed   Hepatitis B Vaccine  Aged Out   HPV Vaccine  Aged Out   Meningitis B Vaccine  Aged Out   Mammogram  Discontinued   Colon Cancer Screening  Discontinued   COVID-19 Vaccine  Discontinued   Hepatitis C Screening  Discontinued  *Topic was postponed. The date shown is not the original due date.    Advanced directives: (Declined) Advance directive discussed with you today. Even though you declined this  today, please call our office should you change your mind, and we can give you the proper paperwork for you to fill out. Advance Care Planning is important because it:  [x]  Makes sure you receive the medical care that is consistent with your values, goals, and preferences  [x]  It provides guidance to your family and loved ones and reduces their decisional burden about whether or not they are making the right decisions based on your wishes.  Follow the link provided in your after visit summary or read over the paperwork we have mailed to you to help you started getting your Advance Directives in place. If you need assistance in completing these, please reach out to us  so that we can help you!  See attachments for Preventive Care and Fall Prevention Tips.

## 2023-10-14 ENCOUNTER — Other Ambulatory Visit: Payer: Self-pay | Admitting: Physician Assistant

## 2023-10-19 ENCOUNTER — Other Ambulatory Visit: Payer: Self-pay | Admitting: Physician Assistant

## 2023-11-04 ENCOUNTER — Other Ambulatory Visit: Payer: Self-pay | Admitting: Physician Assistant

## 2023-11-04 DIAGNOSIS — Z794 Long term (current) use of insulin: Secondary | ICD-10-CM

## 2023-11-09 ENCOUNTER — Other Ambulatory Visit: Payer: Self-pay | Admitting: Physician Assistant

## 2023-11-09 DIAGNOSIS — E785 Hyperlipidemia, unspecified: Secondary | ICD-10-CM

## 2023-11-09 DIAGNOSIS — I10 Essential (primary) hypertension: Secondary | ICD-10-CM

## 2023-12-15 ENCOUNTER — Ambulatory Visit: Admitting: Physician Assistant

## 2023-12-24 ENCOUNTER — Encounter: Payer: Self-pay | Admitting: Physician Assistant

## 2023-12-24 ENCOUNTER — Ambulatory Visit (INDEPENDENT_AMBULATORY_CARE_PROVIDER_SITE_OTHER): Admitting: Physician Assistant

## 2023-12-24 VITALS — BP 138/62 | HR 72

## 2023-12-24 DIAGNOSIS — Z89512 Acquired absence of left leg below knee: Secondary | ICD-10-CM

## 2023-12-24 DIAGNOSIS — N1832 Chronic kidney disease, stage 3b: Secondary | ICD-10-CM | POA: Diagnosis not present

## 2023-12-24 DIAGNOSIS — G8929 Other chronic pain: Secondary | ICD-10-CM

## 2023-12-24 DIAGNOSIS — L304 Erythema intertrigo: Secondary | ICD-10-CM

## 2023-12-24 DIAGNOSIS — D72829 Elevated white blood cell count, unspecified: Secondary | ICD-10-CM | POA: Diagnosis not present

## 2023-12-24 DIAGNOSIS — E1122 Type 2 diabetes mellitus with diabetic chronic kidney disease: Secondary | ICD-10-CM | POA: Diagnosis not present

## 2023-12-24 DIAGNOSIS — M545 Low back pain, unspecified: Secondary | ICD-10-CM

## 2023-12-24 DIAGNOSIS — Z794 Long term (current) use of insulin: Secondary | ICD-10-CM

## 2023-12-24 DIAGNOSIS — Z23 Encounter for immunization: Secondary | ICD-10-CM | POA: Diagnosis not present

## 2023-12-24 LAB — CBC WITH DIFFERENTIAL/PLATELET
Basophils Absolute: 0 K/uL (ref 0.0–0.1)
Basophils Relative: 0.1 % (ref 0.0–3.0)
Eosinophils Absolute: 0.3 K/uL (ref 0.0–0.7)
Eosinophils Relative: 2.3 % (ref 0.0–5.0)
HCT: 37.4 % (ref 36.0–46.0)
Hemoglobin: 12.1 g/dL (ref 12.0–15.0)
Lymphocytes Relative: 12.5 % (ref 12.0–46.0)
Lymphs Abs: 1.7 K/uL (ref 0.7–4.0)
MCHC: 32.4 g/dL (ref 30.0–36.0)
MCV: 89.7 fl (ref 78.0–100.0)
Monocytes Absolute: 0.6 K/uL (ref 0.1–1.0)
Monocytes Relative: 4.8 % (ref 3.0–12.0)
Neutro Abs: 10.7 K/uL — ABNORMAL HIGH (ref 1.4–7.7)
Neutrophils Relative %: 80.3 % — ABNORMAL HIGH (ref 43.0–77.0)
Platelets: 320 K/uL (ref 150.0–400.0)
RBC: 4.16 Mil/uL (ref 3.87–5.11)
RDW: 14.7 % (ref 11.5–15.5)
WBC: 13.4 K/uL — ABNORMAL HIGH (ref 4.0–10.5)

## 2023-12-24 LAB — COMPREHENSIVE METABOLIC PANEL WITH GFR
ALT: 10 U/L (ref 0–35)
AST: 11 U/L (ref 0–37)
Albumin: 3.9 g/dL (ref 3.5–5.2)
Alkaline Phosphatase: 110 U/L (ref 39–117)
BUN: 32 mg/dL — ABNORMAL HIGH (ref 6–23)
CO2: 29 meq/L (ref 19–32)
Calcium: 9.6 mg/dL (ref 8.4–10.5)
Chloride: 106 meq/L (ref 96–112)
Creatinine, Ser: 1.65 mg/dL — ABNORMAL HIGH (ref 0.40–1.20)
GFR: 32.02 mL/min — ABNORMAL LOW (ref >60.00)
Glucose, Bld: 133 mg/dL — ABNORMAL HIGH (ref 70–99)
Potassium: 4.9 meq/L (ref 3.5–5.1)
Sodium: 142 meq/L (ref 135–145)
Total Bilirubin: 0.6 mg/dL (ref 0.2–1.2)
Total Protein: 6.8 g/dL (ref 6.0–8.3)

## 2023-12-24 LAB — POCT GLYCOSYLATED HEMOGLOBIN (HGB A1C): Hemoglobin A1C: 7.9 % — AB (ref 4.0–5.6)

## 2023-12-24 MED ORDER — DAPAGLIFLOZIN PROPANEDIOL 5 MG PO TABS: 5.0000 mg | ORAL_TABLET | Freq: Every day | ORAL | 1 refills | Status: DC

## 2023-12-24 MED ORDER — GABAPENTIN 300 MG PO CAPS: 600.0000 mg | ORAL_CAPSULE | Freq: Two times a day (BID) | ORAL | 2 refills | Status: DC

## 2023-12-24 MED ORDER — NYSTATIN 100000 UNIT/GM EX POWD: 1.0000 | Freq: Three times a day (TID) | CUTANEOUS | 2 refills | Status: AC

## 2023-12-24 NOTE — Patient Instructions (Signed)
  VISIT SUMMARY: During your visit, we discussed your type 2 diabetes, chronic kidney disease, diabetic neuropathy, chronic back pain, and intertrigo. We also administered your flu vaccine and planned for follow-up care.  YOUR PLAN: TYPE 2 DIABETES MELLITUS WITH DIABETIC CHRONIC KIDNEY DISEASE AND DIABETIC NEUROPATHY: Your diabetes is being managed with insulin  and Farxiga . Your A1c has improved from 9.0 to 7.9. You are experiencing high blood sugar levels and neuropathy, which affects your sleep. -Continue your 75/25 insulin  regimen at 80 units daily. -Continue taking Farxiga  5 mg daily. -Increase gabapentin  to 600 mg daily, taking 300 mg twice a day. -We will order blood tests to monitor your kidney function.  CHRONIC BACK PAIN: You have severe back pain, especially in the mornings, and you take Aleve for relief. Gabapentin  may also help with the pain. -We will order blood tests to monitor your kidney function. -Increase gabapentin  to 600 mg daily, taking 300 mg twice a day. -Continue taking Aleve as needed, but we will monitor its impact on your kidneys.  INTERTRIGO, INFRAMAMMARY: You have a rash under your breasts that was previously treated with nystatin  powder. -Prescribe nystatin  powder. Apply it up to three times daily, starting with an evening application.  CHRONIC OBSTRUCTIVE PULMONARY DISEASE (COPD): You have emphysema but are not currently experiencing any issues with it.  GENERAL HEALTH MAINTENANCE: You received your flu vaccination during this visit. -Administered flu vaccine.  FOLLOW-UP: We need to monitor your diabetes management and kidney function. -Schedule a follow-up appointment in four months. -Order blood tests to monitor your kidney function.                      Contains text generated by [employer]bridge.                                 Contains text generated by Abridge.
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
## 2023-12-27 ENCOUNTER — Ambulatory Visit: Payer: Self-pay | Admitting: Physician Assistant

## 2024-01-26 ENCOUNTER — Other Ambulatory Visit: Payer: Self-pay | Admitting: Physician Assistant

## 2024-01-26 DIAGNOSIS — I10 Essential (primary) hypertension: Secondary | ICD-10-CM

## 2024-01-26 DIAGNOSIS — L304 Erythema intertrigo: Secondary | ICD-10-CM

## 2024-01-27 ENCOUNTER — Other Ambulatory Visit: Payer: Self-pay

## 2024-03-10 ENCOUNTER — Other Ambulatory Visit: Payer: Self-pay | Admitting: Physician Assistant

## 2024-03-10 NOTE — Telephone Encounter (Unsigned)
 Copied from CRM #[id number]. Topic: Clinical - Medication Refill >> Mar 10, 2024  2:06 PM Franky GRADE wrote: Medication: gabapentin  (NEURONTIN ) 300 MG capsule [[id number]] ENDED & HUMALOG  MIX 75/25 KWIKPEN (75-25) 100 UNIT/ML KwikPen [[id number]]  Has the patient contacted their pharmacy? Yes, they asked patient to contact the office.  (Agent: If no, request that the patient contact the pharmacy for the refill. If patient does not wish to contact the pharmacy document the reason why and proceed with request.) (Agent: If yes, when and what did the pharmacy advise?)  This is the patient's preferred pharmacy:  [facility] - [place], [place] - [address] [place] [address] Phone: [phone] Fax: [phone]   Is this the correct pharmacy for this prescription? Yes If no, delete pharmacy and type the correct one.   Has the prescription been filled recently? No  Is the patient out of the medication? No  Has the patient been seen for an appointment in the last year OR does the patient have an upcoming appointment? Yes  Can we respond through MyChart? Yes  Agent: Please be advised that Rx refills may take up to 3 business days. We ask that you follow-up with your pharmacy.

## 2024-03-11 MED ORDER — INSULIN LISPRO PROT & LISPRO (75-25 MIX) 100 UNIT/ML KWIKPEN: PEN_INJECTOR | SUBCUTANEOUS | 2 refills | Status: AC

## 2024-03-11 MED ORDER — GABAPENTIN 300 MG PO CAPS: 600.0000 mg | ORAL_CAPSULE | Freq: Two times a day (BID) | ORAL | 2 refills | Status: DC

## 2024-04-04 ENCOUNTER — Other Ambulatory Visit: Payer: Self-pay | Admitting: Physician Assistant

## 2024-04-04 DIAGNOSIS — I10 Essential (primary) hypertension: Secondary | ICD-10-CM

## 2024-04-05 ENCOUNTER — Telehealth: Payer: Self-pay | Admitting: Physician Assistant

## 2024-04-05 ENCOUNTER — Other Ambulatory Visit: Payer: Self-pay

## 2024-04-05 DIAGNOSIS — Z794 Long term (current) use of insulin: Secondary | ICD-10-CM

## 2024-04-05 MED ORDER — DAPAGLIFLOZIN PROPANEDIOL 5 MG PO TABS: 5.0000 mg | ORAL_TABLET | Freq: Every day | ORAL | 1 refills | Status: DC

## 2024-04-05 NOTE — Telephone Encounter (Signed)
" °  Encourage patient to contact the pharmacy for refills or they can request refills through [facility]  LAST APPOINTMENT DATE:  Please schedule appointment if longer than 1 year  NEXT APPOINTMENT DATE: 04/27/2024 @10  AM  MEDICATION: dapagliflozin  propanediol (FARXIGA ) 5 MG TABS tablet   Is the patient out of medication? PT STATED THEY HAVE A WEEK'S WORTH LEFT OF MEDICATION.  PHARMACY:   [facility] - [place], [place] - [address] (Ph: [phone])    Let patient know to contact pharmacy at the end of the day to make sure medication is ready.  Please notify patient to allow 48-72 hours to process   "

## 2024-04-05 NOTE — Telephone Encounter (Signed)
"  Rx sent to pt pharmacy   "

## 2024-04-11 DIAGNOSIS — I10 Essential (primary) hypertension: Secondary | ICD-10-CM

## 2024-04-11 NOTE — Telephone Encounter (Signed)
 Copied from CRM [id number]. Topic: Clinical - Medication Refill >> Apr 11, 2024  3:56 PM Tysheama G wrote: Medication: carvedilol  (COREG ) 25 MG tablet  Has the patient contacted their pharmacy? No (Agent: If no, request that the patient contact the pharmacy for the refill. If patient does not wish to contact the pharmacy document the reason why and proceed with request.) (Agent: If yes, when and what did the pharmacy advise?)  This is the patient's preferred pharmacy:  [facility] - [place], [place] - [address] [place] [address] Phone: [phone] Fax: [phone]    Is this the correct pharmacy for this prescription? Yes If no, delete pharmacy and type the correct one.   Has the prescription been filled recently? No  Is the patient out of the medication? No  Has the patient been seen for an appointment in the last year OR does the patient have an upcoming appointment? Yes  Can we respond through MyChart? Yes  Agent: Please be advised that Rx refills may take up to 3 business days. We ask that you follow-up with your pharmacy.

## 2024-04-12 MED ORDER — CARVEDILOL 25 MG PO TABS: 25.0000 mg | ORAL_TABLET | Freq: Two times a day (BID) | ORAL | 2 refills | Status: AC

## 2024-04-20 ENCOUNTER — Other Ambulatory Visit: Payer: Self-pay

## 2024-04-20 MED ORDER — INSULIN PEN NEEDLE 31G X 8 MM MISC: 1.0000 | Freq: Two times a day (BID) | 11 refills | Status: AC

## 2024-04-23 ENCOUNTER — Other Ambulatory Visit: Payer: Self-pay | Admitting: Physician Assistant

## 2024-04-23 DIAGNOSIS — E785 Hyperlipidemia, unspecified: Secondary | ICD-10-CM

## 2024-04-25 ENCOUNTER — Ambulatory Visit: Admitting: Physician Assistant

## 2024-04-28 ENCOUNTER — Encounter: Payer: Self-pay | Admitting: Physician Assistant

## 2024-04-28 ENCOUNTER — Ambulatory Visit (INDEPENDENT_AMBULATORY_CARE_PROVIDER_SITE_OTHER): Admitting: Physician Assistant

## 2024-04-28 VITALS — BP 122/66 | HR 70 | Temp 97.7°F | Ht 64.0 in | Wt 240.8 lb

## 2024-04-28 DIAGNOSIS — N1832 Chronic kidney disease, stage 3b: Secondary | ICD-10-CM | POA: Diagnosis not present

## 2024-04-28 DIAGNOSIS — I7025 Atherosclerosis of native arteries of other extremities with ulceration: Secondary | ICD-10-CM | POA: Diagnosis not present

## 2024-04-28 DIAGNOSIS — E1122 Type 2 diabetes mellitus with diabetic chronic kidney disease: Secondary | ICD-10-CM | POA: Diagnosis not present

## 2024-04-28 DIAGNOSIS — J432 Centrilobular emphysema: Secondary | ICD-10-CM | POA: Diagnosis not present

## 2024-04-28 DIAGNOSIS — Z89512 Acquired absence of left leg below knee: Secondary | ICD-10-CM

## 2024-04-28 DIAGNOSIS — Z794 Long term (current) use of insulin: Secondary | ICD-10-CM | POA: Diagnosis not present

## 2024-04-28 DIAGNOSIS — I5032 Chronic diastolic (congestive) heart failure: Secondary | ICD-10-CM | POA: Insufficient documentation

## 2024-04-28 LAB — POCT GLYCOSYLATED HEMOGLOBIN (HGB A1C): Hemoglobin A1C: 8.6 % — AB (ref 4.0–5.6)

## 2024-04-28 MED ORDER — DAPAGLIFLOZIN PROPANEDIOL 10 MG PO TABS: 10.0000 mg | ORAL_TABLET | Freq: Every day | ORAL | 1 refills | Status: AC
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
## 2024-04-28 NOTE — Patient Instructions (Signed)
 Increase farxiga  to 10 mg daily  Monitor glucose  Keep working on good nutrition  Labs next visit   Consider cardiology consult

## 2024-05-12 ENCOUNTER — Other Ambulatory Visit: Payer: Self-pay | Admitting: Physician Assistant

## 2024-08-26 ENCOUNTER — Ambulatory Visit: Admitting: Physician Assistant
